# Patient Record
Sex: Female | Born: 1980
Health system: Southern US, Community
[De-identification: ages and names within clinical notes are randomized; demographics above are authoritative.]

## PROBLEM LIST (undated history)

## (undated) ENCOUNTER — Emergency Department (HOSPITAL_BASED_OUTPATIENT_CLINIC_OR_DEPARTMENT_OTHER): Payer: BC Managed Care – PPO | Source: Home / Self Care

## (undated) DIAGNOSIS — I498 Other specified cardiac arrhythmias: Secondary | ICD-10-CM

## (undated) DIAGNOSIS — D6800 Von Willebrand disease, unspecified: Secondary | ICD-10-CM

## (undated) DIAGNOSIS — F419 Anxiety disorder, unspecified: Secondary | ICD-10-CM

## (undated) DIAGNOSIS — Z803 Family history of malignant neoplasm of breast: Secondary | ICD-10-CM

## (undated) DIAGNOSIS — D649 Anemia, unspecified: Secondary | ICD-10-CM

## (undated) DIAGNOSIS — E242 Drug-induced Cushing's syndrome: Secondary | ICD-10-CM

## (undated) DIAGNOSIS — K859 Acute pancreatitis without necrosis or infection, unspecified: Secondary | ICD-10-CM

## (undated) DIAGNOSIS — Z8719 Personal history of other diseases of the digestive system: Secondary | ICD-10-CM

## (undated) DIAGNOSIS — Z800 Family history of malignant neoplasm of digestive organs: Secondary | ICD-10-CM

## (undated) DIAGNOSIS — C50919 Malignant neoplasm of unspecified site of unspecified female breast: Secondary | ICD-10-CM

## (undated) DIAGNOSIS — R112 Nausea with vomiting, unspecified: Secondary | ICD-10-CM

## (undated) DIAGNOSIS — IMO0001 Reserved for inherently not codable concepts without codable children: Secondary | ICD-10-CM

## (undated) DIAGNOSIS — K3580 Unspecified acute appendicitis: Secondary | ICD-10-CM

## (undated) DIAGNOSIS — Z87442 Personal history of urinary calculi: Secondary | ICD-10-CM

## (undated) DIAGNOSIS — J45909 Unspecified asthma, uncomplicated: Secondary | ICD-10-CM

## (undated) DIAGNOSIS — R000 Tachycardia, unspecified: Secondary | ICD-10-CM

## (undated) DIAGNOSIS — IMO0002 Reserved for concepts with insufficient information to code with codable children: Secondary | ICD-10-CM

## (undated) DIAGNOSIS — E249 Cushing's syndrome, unspecified: Secondary | ICD-10-CM

## (undated) DIAGNOSIS — K834 Spasm of sphincter of Oddi: Secondary | ICD-10-CM

## (undated) DIAGNOSIS — G90A Postural orthostatic tachycardia syndrome (POTS): Secondary | ICD-10-CM

## (undated) DIAGNOSIS — I951 Orthostatic hypotension: Secondary | ICD-10-CM

## (undated) DIAGNOSIS — F32A Depression, unspecified: Secondary | ICD-10-CM

## (undated) DIAGNOSIS — R197 Diarrhea, unspecified: Secondary | ICD-10-CM

## (undated) DIAGNOSIS — F329 Major depressive disorder, single episode, unspecified: Secondary | ICD-10-CM

## (undated) DIAGNOSIS — R002 Palpitations: Secondary | ICD-10-CM

## (undated) HISTORY — PX: REDUCTION MAMMAPLASTY: SUR839

## (undated) HISTORY — DX: Postural orthostatic tachycardia syndrome (POTS): G90.A

## (undated) HISTORY — DX: Family history of malignant neoplasm of breast: Z80.3

## (undated) HISTORY — DX: Orthostatic hypotension: I95.1

## (undated) HISTORY — DX: Unspecified acute appendicitis: K35.80

## (undated) HISTORY — DX: Other specified cardiac arrhythmias: I49.8

## (undated) HISTORY — DX: Malignant neoplasm of unspecified site of unspecified female breast: C50.919

## (undated) HISTORY — DX: Drug-induced Cushing's syndrome: E24.2

## (undated) HISTORY — DX: Family history of malignant neoplasm of digestive organs: Z80.0

## (undated) HISTORY — DX: Nausea with vomiting, unspecified: R11.2

## (undated) HISTORY — DX: Tachycardia, unspecified: R00.0

## (undated) HISTORY — DX: Palpitations: R00.2

## (undated) HISTORY — DX: Anemia, unspecified: D64.9

## (undated) HISTORY — PX: BACK SURGERY: SHX140

## (undated) HISTORY — DX: Acute pancreatitis without necrosis or infection, unspecified: K85.90

## (undated) HISTORY — PX: LUMBAR LAMINECTOMY: SHX95

## (undated) HISTORY — PX: CHOLECYSTECTOMY: SHX55

## (undated) HISTORY — DX: Diarrhea, unspecified: R19.7

## (undated) HISTORY — PX: APPENDECTOMY: SHX54

## (undated) HISTORY — PX: LAPAROSCOPIC ENDOMETRIOSIS FULGURATION: SUR769

---
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
## 2002-02-19 ENCOUNTER — Other Ambulatory Visit: Admission: RE | Admit: 2002-02-19 | Discharge: 2002-02-19 | Payer: Self-pay | Admitting: Obstetrics and Gynecology

## 2002-10-20 ENCOUNTER — Ambulatory Visit (HOSPITAL_COMMUNITY): Admission: RE | Admit: 2002-10-20 | Discharge: 2002-10-20 | Payer: Self-pay | Admitting: Internal Medicine

## 2002-10-20 ENCOUNTER — Encounter: Payer: Self-pay | Admitting: Internal Medicine

## 2003-03-04 ENCOUNTER — Other Ambulatory Visit: Admission: RE | Admit: 2003-03-04 | Discharge: 2003-03-04 | Payer: Self-pay | Admitting: Obstetrics and Gynecology

## 2003-07-15 ENCOUNTER — Ambulatory Visit (HOSPITAL_COMMUNITY): Admission: RE | Admit: 2003-07-15 | Discharge: 2003-07-16 | Payer: Self-pay | Admitting: Internal Medicine

## 2003-07-16 ENCOUNTER — Encounter: Payer: Self-pay | Admitting: Internal Medicine

## 2004-02-21 ENCOUNTER — Emergency Department (HOSPITAL_COMMUNITY): Admission: EM | Admit: 2004-02-21 | Discharge: 2004-02-21 | Payer: Self-pay

## 2004-02-24 ENCOUNTER — Ambulatory Visit (HOSPITAL_COMMUNITY): Admission: RE | Admit: 2004-02-24 | Discharge: 2004-02-24 | Payer: Self-pay | Admitting: General Surgery

## 2004-03-29 ENCOUNTER — Other Ambulatory Visit: Admission: RE | Admit: 2004-03-29 | Discharge: 2004-03-29 | Payer: Self-pay | Admitting: Obstetrics and Gynecology

## 2004-10-02 ENCOUNTER — Ambulatory Visit (HOSPITAL_COMMUNITY): Admission: RE | Admit: 2004-10-02 | Discharge: 2004-10-02 | Payer: Self-pay | Admitting: Surgery

## 2004-10-24 ENCOUNTER — Emergency Department (HOSPITAL_COMMUNITY): Admission: EM | Admit: 2004-10-24 | Discharge: 2004-10-24 | Payer: Self-pay | Admitting: Emergency Medicine

## 2004-10-25 ENCOUNTER — Emergency Department (HOSPITAL_COMMUNITY): Admission: EM | Admit: 2004-10-25 | Discharge: 2004-10-26 | Payer: Self-pay | Admitting: Emergency Medicine

## 2004-11-20 ENCOUNTER — Ambulatory Visit: Payer: Self-pay | Admitting: Oncology

## 2005-02-16 ENCOUNTER — Ambulatory Visit: Payer: Self-pay | Admitting: Oncology

## 2005-08-14 ENCOUNTER — Encounter: Admission: RE | Admit: 2005-08-14 | Discharge: 2005-08-14 | Payer: Self-pay | Admitting: Occupational Medicine

## 2005-08-21 ENCOUNTER — Encounter: Admission: RE | Admit: 2005-08-21 | Discharge: 2005-08-21 | Payer: Self-pay | Admitting: Occupational Medicine

## 2005-10-06 ENCOUNTER — Emergency Department (HOSPITAL_COMMUNITY): Admission: EM | Admit: 2005-10-06 | Discharge: 2005-10-06 | Payer: Self-pay | Admitting: Emergency Medicine

## 2005-12-25 ENCOUNTER — Ambulatory Visit: Payer: Self-pay | Admitting: Internal Medicine

## 2006-01-01 ENCOUNTER — Ambulatory Visit: Payer: Self-pay | Admitting: Oncology

## 2006-01-02 ENCOUNTER — Ambulatory Visit: Payer: Self-pay | Admitting: Oncology

## 2006-02-15 ENCOUNTER — Encounter: Admission: RE | Admit: 2006-02-15 | Discharge: 2006-02-15 | Payer: Self-pay | Admitting: *Deleted

## 2006-03-22 ENCOUNTER — Ambulatory Visit: Payer: Self-pay | Admitting: Oncology

## 2006-03-26 LAB — IRON AND TIBC
%SAT: 38 % (ref 20–55)
Iron: 130 ug/dL (ref 42–145)
TIBC: 343 ug/dL (ref 250–470)

## 2006-03-26 LAB — CBC WITH DIFFERENTIAL (CANCER CENTER ONLY)
BASO%: 0.6 % (ref 0.0–2.0)
Eosinophils Absolute: 0.1 10*3/uL (ref 0.0–0.5)
HCT: 39.4 % (ref 34.8–46.6)
LYMPH#: 1.8 10*3/uL (ref 0.9–3.3)
LYMPH%: 30.3 % (ref 14.0–48.0)
MCV: 91 fL (ref 81–101)
MONO#: 0.5 10*3/uL (ref 0.1–0.9)
NEUT%: 59.8 % (ref 39.6–80.0)
Platelets: 256 10*3/uL (ref 145–400)
RDW: 11.6 % (ref 10.5–14.6)

## 2006-03-26 LAB — BASIC METABOLIC PANEL - CANCER CENTER ONLY
CO2: 21 mEq/L (ref 18–33)
Calcium: 9.1 mg/dL (ref 8.0–10.3)
Potassium: 3.7 mEq/L (ref 3.3–4.7)
Sodium: 138 mEq/L (ref 128–145)

## 2006-03-26 LAB — FERRITIN: Ferritin: 130 ng/mL (ref 10–291)

## 2006-06-24 ENCOUNTER — Ambulatory Visit: Payer: Self-pay | Admitting: Oncology

## 2006-06-25 LAB — CBC WITH DIFFERENTIAL (CANCER CENTER ONLY)
BASO#: 0 10*3/uL (ref 0.0–0.2)
EOS%: 2 % (ref 0.0–7.0)
HCT: 41.1 % (ref 34.8–46.6)
HGB: 13.6 g/dL (ref 11.6–15.9)
LYMPH#: 1.9 10*3/uL (ref 0.9–3.3)
LYMPH%: 33.6 % (ref 14.0–48.0)
MCH: 30.9 pg (ref 26.0–34.0)
MCHC: 33.2 g/dL (ref 32.0–36.0)
MCV: 93 fL (ref 81–101)
MONO%: 8 % (ref 0.0–13.0)
NEUT%: 56 % (ref 39.6–80.0)

## 2006-06-26 LAB — FERRITIN: Ferritin: 160 ng/mL (ref 10–291)

## 2006-06-26 LAB — IRON AND TIBC: Iron: 105 ug/dL (ref 42–145)

## 2006-09-16 ENCOUNTER — Ambulatory Visit: Payer: Self-pay | Admitting: Oncology

## 2007-11-25 ENCOUNTER — Ambulatory Visit: Payer: Self-pay | Admitting: Oncology

## 2007-11-27 LAB — CBC WITH DIFFERENTIAL (CANCER CENTER ONLY)
BASO#: 0.1 10*3/uL (ref 0.0–0.2)
HCT: 39.9 % (ref 34.8–46.6)
HGB: 13.5 g/dL (ref 11.6–15.9)
LYMPH#: 2.1 10*3/uL (ref 0.9–3.3)
LYMPH%: 30.9 % (ref 14.0–48.0)
MCV: 89 fL (ref 81–101)
MONO#: 0.4 10*3/uL (ref 0.1–0.9)
NEUT%: 60.2 % (ref 39.6–80.0)
RDW: 11.2 % (ref 10.5–14.6)
WBC: 6.8 10*3/uL (ref 3.9–10.0)

## 2007-11-27 LAB — FERRITIN: Ferritin: 149 ng/mL (ref 10–291)

## 2007-11-27 LAB — IRON AND TIBC: Iron: 149 ug/dL — ABNORMAL HIGH (ref 42–145)

## 2009-04-26 ENCOUNTER — Ambulatory Visit: Payer: Self-pay | Admitting: Oncology

## 2009-05-04 LAB — CBC WITH DIFFERENTIAL (CANCER CENTER ONLY)
BASO#: 0.1 10*3/uL (ref 0.0–0.2)
HCT: 41 % (ref 34.8–46.6)
HGB: 14.1 g/dL (ref 11.6–15.9)
LYMPH#: 1.6 10*3/uL (ref 0.9–3.3)
MCV: 89 fL (ref 81–101)
MONO#: 0.4 10*3/uL (ref 0.1–0.9)
NEUT%: 62 % (ref 39.6–80.0)
WBC: 5.9 10*3/uL (ref 3.9–10.0)

## 2009-05-04 LAB — APTT: aPTT: 29 seconds (ref 24–37)

## 2009-05-04 LAB — PROTIME-INR (CHCC SATELLITE)
INR: 1 — ABNORMAL LOW (ref 2.0–3.5)
Protime: 12 Seconds (ref 10.6–13.4)

## 2009-05-04 LAB — CMP (CANCER CENTER ONLY)
ALT(SGPT): 19 U/L (ref 10–47)
Albumin: 3.9 g/dL (ref 3.3–5.5)
CO2: 26 mEq/L (ref 18–33)
Calcium: 9.2 mg/dL (ref 8.0–10.3)
Chloride: 98 mEq/L (ref 98–108)
Creat: 0.7 mg/dl (ref 0.6–1.2)
Potassium: 3.8 mEq/L (ref 3.3–4.7)
Sodium: 143 mEq/L (ref 128–145)
Total Protein: 7.7 g/dL (ref 6.4–8.1)

## 2009-05-04 LAB — FERRITIN: Ferritin: 19 ng/mL (ref 10–291)

## 2009-05-26 ENCOUNTER — Ambulatory Visit: Payer: Self-pay | Admitting: Oncology

## 2009-06-22 ENCOUNTER — Ambulatory Visit: Payer: Self-pay | Admitting: Oncology

## 2009-06-25 ENCOUNTER — Emergency Department (HOSPITAL_COMMUNITY): Admission: EM | Admit: 2009-06-25 | Discharge: 2009-06-25 | Payer: Self-pay | Admitting: Emergency Medicine

## 2010-04-17 ENCOUNTER — Ambulatory Visit: Payer: Self-pay | Admitting: Oncology

## 2010-04-21 ENCOUNTER — Ambulatory Visit: Payer: Self-pay | Admitting: Oncology

## 2010-04-21 LAB — CBC WITH DIFFERENTIAL/PLATELET
BASO%: 0.2 % (ref 0.0–2.0)
Basophils Absolute: 0 10*3/uL (ref 0.0–0.1)
EOS%: 0.1 % (ref 0.0–7.0)
Eosinophils Absolute: 0 10*3/uL (ref 0.0–0.5)
HCT: 36.7 % (ref 34.8–46.6)
HGB: 12.7 g/dL (ref 11.6–15.9)
LYMPH%: 10.6 % — ABNORMAL LOW (ref 14.0–49.7)
MCH: 32.2 pg (ref 25.1–34.0)
MCHC: 34.5 g/dL (ref 31.5–36.0)
MCV: 93.3 fL (ref 79.5–101.0)
MONO#: 0.5 10*3/uL (ref 0.1–0.9)
MONO%: 5.3 % (ref 0.0–14.0)
NEUT#: 8.3 10*3/uL — ABNORMAL HIGH (ref 1.5–6.5)
NEUT%: 83.8 % — ABNORMAL HIGH (ref 38.4–76.8)
Platelets: 216 10*3/uL (ref 145–400)
RBC: 3.94 10*6/uL (ref 3.70–5.45)
RDW: 13.4 % (ref 11.2–14.5)
WBC: 9.9 10*3/uL (ref 3.9–10.3)
lymph#: 1 10*3/uL (ref 0.9–3.3)

## 2010-04-21 LAB — COMPREHENSIVE METABOLIC PANEL
ALT: 27 U/L (ref 0–35)
AST: 21 U/L (ref 0–37)
Albumin: 4 g/dL (ref 3.5–5.2)
Alkaline Phosphatase: 48 U/L (ref 39–117)
BUN: 13 mg/dL (ref 6–23)
CO2: 26 mEq/L (ref 19–32)
Calcium: 8.6 mg/dL (ref 8.4–10.5)
Chloride: 108 mEq/L (ref 96–112)
Creatinine, Ser: 0.82 mg/dL (ref 0.40–1.20)
Glucose, Bld: 89 mg/dL (ref 70–99)
Potassium: 3.6 mEq/L (ref 3.5–5.3)
Sodium: 138 mEq/L (ref 135–145)
Total Bilirubin: 0.6 mg/dL (ref 0.3–1.2)
Total Protein: 7.1 g/dL (ref 6.0–8.3)

## 2010-04-21 LAB — VITAMIN B12: Vitamin B-12: 236 pg/mL (ref 211–911)

## 2010-04-21 LAB — FOLATE: Folate: 5.4 ng/mL

## 2010-04-21 LAB — IRON AND TIBC
%SAT: 51 % (ref 20–55)
Iron: 139 ug/dL (ref 42–145)
TIBC: 273 ug/dL (ref 250–470)
UIBC: 134 ug/dL

## 2010-04-21 LAB — FERRITIN: Ferritin: 227 ng/mL (ref 10–291)

## 2010-08-29 HISTORY — PX: ABDOMINAL HYSTERECTOMY: SHX81

## 2010-09-18 ENCOUNTER — Encounter (INDEPENDENT_AMBULATORY_CARE_PROVIDER_SITE_OTHER): Payer: Self-pay | Admitting: Obstetrics and Gynecology

## 2010-09-18 ENCOUNTER — Ambulatory Visit (HOSPITAL_COMMUNITY): Admission: RE | Admit: 2010-09-18 | Discharge: 2010-09-19 | Payer: Self-pay | Admitting: Obstetrics and Gynecology

## 2011-01-09 LAB — CBC
HCT: 34.4 % — ABNORMAL LOW (ref 36.0–46.0)
HCT: 38.6 % (ref 36.0–46.0)
Hemoglobin: 11.6 g/dL — ABNORMAL LOW (ref 12.0–15.0)
Hemoglobin: 13.3 g/dL (ref 12.0–15.0)
MCH: 32.3 pg (ref 26.0–34.0)
MCH: 32.4 pg (ref 26.0–34.0)
MCHC: 33.9 g/dL (ref 30.0–36.0)
MCHC: 34.5 g/dL (ref 30.0–36.0)
MCV: 94.1 fL (ref 78.0–100.0)
MCV: 95.2 fL (ref 78.0–100.0)
Platelets: 207 10*3/uL (ref 150–400)
Platelets: 211 10*3/uL (ref 150–400)
RBC: 3.61 MIL/uL — ABNORMAL LOW (ref 3.87–5.11)
RBC: 4.11 MIL/uL (ref 3.87–5.11)
RDW: 12.4 % (ref 11.5–15.5)
RDW: 12.8 % (ref 11.5–15.5)
WBC: 11.6 10*3/uL — ABNORMAL HIGH (ref 4.0–10.5)
WBC: 7.3 10*3/uL (ref 4.0–10.5)

## 2011-01-09 LAB — SURGICAL PCR SCREEN
MRSA, PCR: NEGATIVE
Staphylococcus aureus: NEGATIVE

## 2011-01-09 LAB — HCG, SERUM, QUALITATIVE: Preg, Serum: NEGATIVE

## 2011-01-09 LAB — IRON: Iron: 102 ug/dL (ref 42–135)

## 2011-03-02 ENCOUNTER — Emergency Department (HOSPITAL_BASED_OUTPATIENT_CLINIC_OR_DEPARTMENT_OTHER)
Admission: EM | Admit: 2011-03-02 | Discharge: 2011-03-03 | Disposition: A | Discharge status: Home / Self Care | Payer: PRIVATE HEALTH INSURANCE | Attending: Emergency Medicine | Admitting: Emergency Medicine

## 2011-03-02 DIAGNOSIS — R21 Rash and other nonspecific skin eruption: Secondary | ICD-10-CM | POA: Insufficient documentation

## 2011-03-02 DIAGNOSIS — T782XXA Anaphylactic shock, unspecified, initial encounter: Secondary | ICD-10-CM | POA: Insufficient documentation

## 2011-03-02 DIAGNOSIS — I10 Essential (primary) hypertension: Secondary | ICD-10-CM | POA: Insufficient documentation

## 2011-03-02 DIAGNOSIS — Z79899 Other long term (current) drug therapy: Secondary | ICD-10-CM | POA: Insufficient documentation

## 2011-03-16 NOTE — Procedures (Signed)
. [facility]  Patient:    Kaitlyn Johnson, Kaitlyn Johnson                    MRN: [id number] Adm. Date:  [id number] Attending:  Vincent Peyer                           Procedure Report  PROCEDURE:  Colonoscopy.  INDICATIONS:  This 30 year old white female has had chronic diarrhea which occurs not only post prandially but also, at times, at night.  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 Margot Chimes.  She subsequently had right upper quadrant/lower abdominal pain and underwent cholecystectomy with resulting change in the bowel habits.  She has failed the standard treatment for irritable bowel syndrome using antispasmodics and antimotility agents.  She is undergoing colonoscopy because of a family history of Crohns disease in a distant relative.  Her small bowel follow through was normal.  ENDOSCOPE:  Fujinon single-channel videoscope.  SEDATION:  Versed 10 mg IV, Fentanyl 75 mcg IV.  FINDINGS:  Fujinon single-channel video endoscope passed under direct vision through the rectum to sigmoid colon.  Patient was monitored by pulse oximetry. Oxygen saturation was normal.  Her prep was excellent.  Anal canal and rectal ampulla were normal.  Colonoscope passed easily through the normal-appearing sigmoid colon.  Submucosal vascular pattern was normal.  There were no aphthous ulcers, no deformity, no diverticulosis.  Colonoscope passed easily through the descending colon through the splenic flexure, transverse colon, hepatic flexure, right colon, and to the cecum.  Cecal pouch appeared clean with normal-appearing appendiceal opening;  also ileocecal valve appeared normal.  Multiple biopsies were taken from the ileocecal valve.  I was unable to enter into the terminal ileum.  As the endoscope was retracted random biopsies were taken from the right, transverse,  and left colon to rule out microscopic colitis.  Patient tolerated the procedure well.  IMPRESSION:  Normal colonoscopy to the cecum status post biopsy of the ileocecal valve and random biopsies of the colon.  PLAN:  Patient is to continue on the irritable bowel regimen for this time using Levbid 0.37 mg p.o. b.i.d. and Colestid tablets one b.i.d.  She is also on p.r.n. Lomotil and low fat diet.  If the biopsies are normal then she will continue on this regimen.DD:  06/10/01 TD:  06/10/01 Job: [id number]

## 2011-03-16 NOTE — Op Note (Signed)
[place]  Patient:    Kaitlyn Johnson, Kaitlyn Johnson                    MRN: [id number] Adm. Date:  [id number] Attending:  Oletha Cruel CC:         Lowella Bandy. Olevia Perches, M.D. [facility]   Operative Report  CCS# [id number]  PREOPERATIVE DIAGNOSIS:  Chronic cholecystitis with probable cholelithiasis or gallbladder polyps.  POSTOPERATIVE DIAGNOSIS:  Chronic cholecystitis with probable cholelithiasis or gallbladder polyps.  OPERATION PERFORMED:  Laparoscopic cholecystectomy.  SURGEON:  Haywood Lasso, M.D.  ASSISTANT:  Lew Dawes. Rosana Hoes, M.D.  ANESTHESIA:  General endotracheal.  INDICATIONS FOR PROCEDURE:  The patient is a 30 year old girl who about a year and a half ago underwent a duodenal bypass for a superior mesenteric artery syndrome and significantly improved her GI symptoms.  She had recently developed some more GI symptoms, these different from the original with more postprandial pain and evaluation had shown what appeared to be some small nonshadowing stones in the gallbladder.  It was felt that her symptoms would most likely biliary in origin and after discussion with her and her family, we elected to proceed to laparoscopic cholecystectomy.  She had also been previously evaluated by Dr. Olevia Perches who had actually recommended cholecystectomy.  DESCRIPTION OF PROCEDURE:  The patient was brought to the operating room and after satisfactory general endotracheal had been obtained, the abdomen was prepped and draped.  I injected some 0.25% plain Marcaine at the umbilicus and made an umbilical incision.  The fascia was identified, picked up and opened. The peritoneal cavity was gingerly entered because of her prior surgery but there was free entry into the peritoneal cavity.  I was able to put my finger in and feel that there were no adhesions going up into the right upper quadrant.  A pursestring suture was placed and the Hasson introduced and the  abdomen insufflated to 15.  The camera was placed and I could see a couple of adhesions of omentum to the midline incision but basically and a couple in the right lateral abdomen that were flimsy but we had good access into the upper abdomen.  I injected some more Marcaine in the epigastrium and made a transverse incision and placed a #10-11 dilating trocar through under direct vision.  Using that I took a few of the lateral adhesions down to free up a little more space and we put two 5 mm trocars in.  There were some adhesions of the duodenum and what appeared to the the Roux-en-Y loop up to the gallbladder.  These were taken down sharply with scissors and gallbladder freed up.  I was able to then begin dissection in the triangle of Calot and identified the cystic artery, the common duct and the cystic duct and the cystic duct was a little bit kinked but with a little dissection, we were able to straighten this out.  There was a small what appeared to be a lymphatic on the surface which I clipped and then divided giving up a little bit better mobility and then once the anatomy was cleared up and I could see well into the triangle to make sure there were no other structures back there, I went ahead and clipped and divided the cystic followed by clipping and dividing the cystic artery.  There appeared to be a small branch of the cystic artery that I found as we were bringing the gallbladder up and out and  that was also clipped.  The gallbladder was removed from below to above using coagulation current of the cautery.  It was somewaht intrahepatic but came out nicely with no spillage of bile and no significant bleeding.  Prior to disconnecting we irrigated to make sure everything was dry and again, everything appeared to be fine.  The gallbladder was disconnected and brought out the umbilical port.  The umbilical site was occluded for a moment while the abdomen was reinflated and then we  checked for hemostasis and then removed the lateral ports.  They were dry and the umbilical port was closed with a pursestring suture.  The abdomen was deflated through the epigastric port.  The skin was closed with 4-0 Monocryl and subcuticular and Steri-Strips.  The patient tolerated the procedure well.  There were no operative complications.  All counts were correct.DD:  08/29/00 TD:  08/30/00 Job: [id number]

## 2011-05-21 ENCOUNTER — Other Ambulatory Visit: Payer: Self-pay | Admitting: Oncology

## 2011-05-21 ENCOUNTER — Encounter (HOSPITAL_BASED_OUTPATIENT_CLINIC_OR_DEPARTMENT_OTHER): Payer: PRIVATE HEALTH INSURANCE | Admitting: Oncology

## 2011-05-21 DIAGNOSIS — D508 Other iron deficiency anemias: Secondary | ICD-10-CM

## 2011-05-21 LAB — CBC & DIFF AND RETIC
BASO%: 0.3 % (ref 0.0–2.0)
Basophils Absolute: 0 10*3/uL (ref 0.0–0.1)
EOS%: 0.8 % (ref 0.0–7.0)
Eosinophils Absolute: 0.1 10*3/uL (ref 0.0–0.5)
HCT: 36.3 % (ref 34.8–46.6)
HGB: 11.8 g/dL (ref 11.6–15.9)
Immature Retic Fract: 6 % (ref 0.00–10.70)
LYMPH%: 25 % (ref 14.0–49.7)
MCHC: 32.5 g/dL (ref 31.5–36.0)
MCV: 97.6 fL (ref 79.5–101.0)
MONO%: 6.9 % (ref 0.0–14.0)
NEUT#: 4.4 10*3/uL (ref 1.5–6.5)
NEUT%: 67 % (ref 38.4–76.8)
Platelets: 239 10*3/uL (ref 145–400)
RBC: 3.72 10*6/uL (ref 3.70–5.45)
RDW: 13.3 % (ref 11.2–14.5)
Retic Ct Abs: 63.61 10*3/uL (ref 18.30–72.70)
lymph#: 1.6 10*3/uL (ref 0.9–3.3)

## 2011-05-21 LAB — IRON AND TIBC
TIBC: 222 ug/dL — ABNORMAL LOW (ref 250–470)
UIBC: 147 ug/dL

## 2011-05-21 LAB — COMPREHENSIVE METABOLIC PANEL
AST: 20 U/L (ref 0–37)
BUN: 19 mg/dL (ref 6–23)
Calcium: 9 mg/dL (ref 8.4–10.5)
Chloride: 100 mEq/L (ref 96–112)
Creatinine, Ser: 0.74 mg/dL (ref 0.50–1.10)

## 2011-07-06 ENCOUNTER — Encounter (HOSPITAL_BASED_OUTPATIENT_CLINIC_OR_DEPARTMENT_OTHER): Payer: PRIVATE HEALTH INSURANCE | Admitting: Oncology

## 2011-07-06 ENCOUNTER — Other Ambulatory Visit: Payer: Self-pay | Admitting: Oncology

## 2011-07-06 DIAGNOSIS — D509 Iron deficiency anemia, unspecified: Secondary | ICD-10-CM

## 2011-07-06 DIAGNOSIS — E538 Deficiency of other specified B group vitamins: Secondary | ICD-10-CM

## 2011-07-06 LAB — CBC WITH DIFFERENTIAL/PLATELET
Eosinophils Absolute: 0 10*3/uL (ref 0.0–0.5)
HGB: 13.4 g/dL (ref 11.6–15.9)
MONO#: 0.5 10*3/uL (ref 0.1–0.9)
NEUT#: 4.4 10*3/uL (ref 1.5–6.5)
Platelets: 262 10*3/uL (ref 145–400)
RBC: 3.91 10*6/uL (ref 3.70–5.45)
RDW: 13.2 % (ref 11.2–14.5)
WBC: 6.2 10*3/uL (ref 3.9–10.3)

## 2011-07-06 LAB — FERRITIN: Ferritin: 158 ng/mL (ref 10–291)

## 2011-07-06 LAB — IRON AND TIBC
%SAT: 28 % (ref 20–55)
Iron: 88 ug/dL (ref 42–145)
TIBC: 311 ug/dL (ref 250–470)

## 2011-07-09 ENCOUNTER — Other Ambulatory Visit: Payer: Self-pay | Admitting: Oncology

## 2011-07-09 ENCOUNTER — Encounter (HOSPITAL_BASED_OUTPATIENT_CLINIC_OR_DEPARTMENT_OTHER): Payer: PRIVATE HEALTH INSURANCE | Admitting: Oncology

## 2011-07-09 DIAGNOSIS — D509 Iron deficiency anemia, unspecified: Secondary | ICD-10-CM

## 2011-07-09 DIAGNOSIS — E538 Deficiency of other specified B group vitamins: Secondary | ICD-10-CM

## 2011-07-16 LAB — VON WILLEBRAND FACTOR MULTIMER
Factor-VIII Activity: 124 % (ref 50–180)
Ristocetin Co-Factor: 91 % (ref 42–200)
Von Willebrand Factor Ag: 86 % (ref 50–217)

## 2011-07-16 LAB — FACTOR 2 ASSAY: Factor II Activity: 120 % (ref 74–131)

## 2011-08-10 ENCOUNTER — Other Ambulatory Visit: Payer: Self-pay | Admitting: Oncology

## 2011-08-10 ENCOUNTER — Encounter (HOSPITAL_BASED_OUTPATIENT_CLINIC_OR_DEPARTMENT_OTHER): Payer: PRIVATE HEALTH INSURANCE | Admitting: Oncology

## 2011-08-10 DIAGNOSIS — D509 Iron deficiency anemia, unspecified: Secondary | ICD-10-CM

## 2011-08-10 DIAGNOSIS — E538 Deficiency of other specified B group vitamins: Secondary | ICD-10-CM

## 2011-08-10 DIAGNOSIS — D689 Coagulation defect, unspecified: Secondary | ICD-10-CM

## 2011-08-10 LAB — IVY BLEEDING TIME

## 2011-08-15 LAB — FACTOR 11 ASSAY: Factor XI Activity: 99 % (ref 65–150)

## 2011-08-15 LAB — FACTOR 13 ACTIVITY

## 2011-08-21 LAB — PLATELET AGGREGATION STUDY, BLOOD
ADP10: NORMAL
ADP5: NORMAL

## 2011-10-03 ENCOUNTER — Telehealth: Payer: Self-pay | Admitting: *Deleted

## 2011-10-03 NOTE — Telephone Encounter (Signed)
left voicemessage to inform the patient of the new date and time of the 11-26-2011 starting at 1:00pm

## 2011-11-26 ENCOUNTER — Encounter: Payer: Self-pay | Admitting: Oncology

## 2011-11-26 ENCOUNTER — Telehealth: Payer: Self-pay | Admitting: Oncology

## 2011-11-26 ENCOUNTER — Other Ambulatory Visit (HOSPITAL_BASED_OUTPATIENT_CLINIC_OR_DEPARTMENT_OTHER): Payer: Commercial Managed Care - PPO | Admitting: Lab

## 2011-11-26 ENCOUNTER — Ambulatory Visit (HOSPITAL_BASED_OUTPATIENT_CLINIC_OR_DEPARTMENT_OTHER): Payer: Commercial Managed Care - PPO | Admitting: Oncology

## 2011-11-26 DIAGNOSIS — I498 Other specified cardiac arrhythmias: Secondary | ICD-10-CM | POA: Diagnosis present

## 2011-11-26 DIAGNOSIS — E538 Deficiency of other specified B group vitamins: Secondary | ICD-10-CM

## 2011-11-26 DIAGNOSIS — G90A Postural orthostatic tachycardia syndrome (POTS): Secondary | ICD-10-CM | POA: Diagnosis present

## 2011-11-26 DIAGNOSIS — R000 Tachycardia, unspecified: Secondary | ICD-10-CM

## 2011-11-26 DIAGNOSIS — D689 Coagulation defect, unspecified: Secondary | ICD-10-CM

## 2011-11-26 DIAGNOSIS — R238 Other skin changes: Secondary | ICD-10-CM | POA: Insufficient documentation

## 2011-11-26 DIAGNOSIS — D649 Anemia, unspecified: Secondary | ICD-10-CM | POA: Insufficient documentation

## 2011-11-26 DIAGNOSIS — D509 Iron deficiency anemia, unspecified: Secondary | ICD-10-CM

## 2011-11-26 DIAGNOSIS — I951 Orthostatic hypotension: Secondary | ICD-10-CM

## 2011-11-26 HISTORY — DX: Anemia, unspecified: D64.9

## 2011-11-26 LAB — CBC WITH DIFFERENTIAL/PLATELET
Basophils Absolute: 0 10*3/uL (ref 0.0–0.1)
Eosinophils Absolute: 0.1 10*3/uL (ref 0.0–0.5)
HCT: 39 % (ref 34.8–46.6)
HGB: 13.2 g/dL (ref 11.6–15.9)
LYMPH%: 25.9 % (ref 14.0–49.7)
MCV: 97.7 fL (ref 79.5–101.0)
MONO%: 8.8 % (ref 0.0–14.0)
NEUT#: 3.9 10*3/uL (ref 1.5–6.5)
Platelets: 260 10*3/uL (ref 145–400)
RDW: 13.1 % (ref 11.2–14.5)

## 2011-11-26 NOTE — Progress Notes (Signed)
OFFICE PROGRESS NOTE  CC Dr. Sharrell Ku  DIAGNOSIS: 31 year old female with iron deficiency anemia secondary to malabsorption and easy bruising.  PRIOR THERAPY:  #1 patient has had parenteral iron for iron deficiency anemia. Currently her counts remain stable.  #2 easy bruising: Patient hasn't had a full workup without anything being revealing.  CURRENT THERAPY:Observation  INTERVAL HISTORY: Kaitlyn Johnson 31 y.o. female returns for Followup visit today. Overall she is doing well she is without any significant complaints except for ongoing bruising. She has noticed some bruising only on the legs. There is no evidence of bruising on the abdomen chest or back or upper arms. She has not had any bleeding problems. She denies any fevers chills night sweats headaches she has no shortness of breath no chest pains or palpitations. She has no hematochezia or melena. Remainder of the 10 point review of systems is negative.  MEDICAL HISTORY: Past Medical History  Diagnosis Date  . Anemia   . Clotting disorder     bruising easy  . Anemia 11/26/2011  . Easy bruising 11/26/2011  . POTS (postural orthostatic tachycardia syndrome) 11/26/2011    ALLERGIES:   has no known allergies.  MEDICATIONS:  Current Outpatient Prescriptions  Medication Sig Dispense Refill  . budesonide-formoterol (SYMBICORT) 80-4.5 MCG/ACT inhaler Inhale 2 puffs into the lungs 2 (two) times daily.      Marland Kitchen diltiazem (CARDIZEM) 90 MG tablet Take 185 mg by mouth daily.      . DULoxetine (CYMBALTA) 60 MG capsule Take 60 mg by mouth daily.      Marland Kitchen lamoTRIgine (LAMICTAL) 100 MG tablet Take 100 mg by mouth daily.      Marland Kitchen levalbuterol (XOPENEX HFA) 45 MCG/ACT inhaler Inhale 1-2 puffs into the lungs every 4 (four) hours as needed.      . metoprolol (LOPRESSOR) 50 MG tablet Take 75 mg by mouth 2 (two) times daily.      . montelukast (SINGULAIR) 10 MG tablet Take 10 mg by mouth at bedtime.      . vitamin B-12 (CYANOCOBALAMIN)  1000 MCG tablet Take 1,000 mcg by mouth daily.      Marland Kitchen zolpidem (AMBIEN) 10 MG tablet Take 12 mg by mouth at bedtime as needed.        SURGICAL HISTORY: History reviewed. No pertinent past surgical history.  REVIEW OF SYSTEMS:  Pertinent items are noted in HPI.   PHYSICAL EXAMINATION: General appearance: alert, cooperative and appears stated age Head: Normocephalic, without obvious abnormality, atraumatic Neck: no adenopathy, no carotid bruit, no JVD, supple, symmetrical, trachea midline and thyroid not enlarged, symmetric, no tenderness/mass/nodules Lymph nodes: Cervical, supraclavicular, and axillary nodes normal. Resp: clear to auscultation bilaterally and normal percussion bilaterally Back: symmetric, no curvature. ROM normal. No CVA tenderness. Cardio: regular rate and rhythm, S1, S2 normal, no murmur, click, rub or gallop GI: soft, non-tender; bowel sounds normal; no masses,  no organomegaly Extremities: extremities normal, atraumatic, no cyanosis or edema and she is noted to have some bruising on the anterior shins. She also has one area of bruising in the upper thigh laterally. Neurologic: Alert and oriented X 3, normal strength and tone. Normal symmetric reflexes. Normal coordination and gait  ECOG PERFORMANCE STATUS: 0 - Asymptomatic  Blood pressure 104/68, pulse 61, temperature 98 F (36.7 C), temperature source Oral, weight 166 lb (75.297 kg).  LABORATORY DATA: Lab Results  Component Value Date   WBC 6.2 11/26/2011   HGB 13.2 11/26/2011   HCT 39.0 11/26/2011   MCV  97.7 11/26/2011   PLT 260 11/26/2011       RADIOGRAPHIC STUDIES:  No results found.  ASSESSMENT: 31 year old female with  #1 iron deficiency anemia secondary to malabsorption.  #2 easy bruising unclear etiology workup unrevealing.  #3 pots being worked up [employer]. Lynnea Ferrier at [facility] cardiology.   PLAN:   #1 patient's CBC looks terrific at this time there is no need for iron infusions. We will  continue to monitor her.  #2 easy bruising continue monitoring.   All questions were answered. The patient knows to call the clinic with any problems, questions or concerns. We can certainly see the patient much sooner if necessary.  I spent 20 minutes counseling the patient face to face. The total time spent in the appointment was 30 minutes.    Drue Second, MD Medical/Oncology [facility] [phone] (beeper) [phone] (Office)  11/26/2011, 5:02 PM

## 2011-11-26 NOTE — Telephone Encounter (Signed)
gve the pt her July 2013 appt calendar

## 2012-04-17 ENCOUNTER — Other Ambulatory Visit: Payer: Self-pay | Admitting: Oncology

## 2012-04-17 DIAGNOSIS — D649 Anemia, unspecified: Secondary | ICD-10-CM

## 2012-04-18 ENCOUNTER — Other Ambulatory Visit (HOSPITAL_BASED_OUTPATIENT_CLINIC_OR_DEPARTMENT_OTHER): Payer: Commercial Managed Care - PPO | Admitting: Lab

## 2012-04-18 DIAGNOSIS — D509 Iron deficiency anemia, unspecified: Secondary | ICD-10-CM

## 2012-04-18 DIAGNOSIS — D649 Anemia, unspecified: Secondary | ICD-10-CM

## 2012-04-18 LAB — CBC WITH DIFFERENTIAL/PLATELET
BASO%: 0.3 % (ref 0.0–2.0)
EOS%: 1.3 % (ref 0.0–7.0)
HCT: 37 % (ref 34.8–46.6)
MCH: 33.8 pg (ref 25.1–34.0)
MCHC: 34.3 g/dL (ref 31.5–36.0)
MONO#: 0.6 10*3/uL (ref 0.1–0.9)
NEUT%: 63.2 % (ref 38.4–76.8)
RDW: 13 % (ref 11.2–14.5)
WBC: 7.6 10*3/uL (ref 3.9–10.3)
lymph#: 2 10*3/uL (ref 0.9–3.3)

## 2012-04-21 LAB — COMPREHENSIVE METABOLIC PANEL
AST: 13 U/L (ref 0–37)
Albumin: 4.3 g/dL (ref 3.5–5.2)
Alkaline Phosphatase: 66 U/L (ref 39–117)
BUN: 16 mg/dL (ref 6–23)
Creatinine, Ser: 0.77 mg/dL (ref 0.50–1.10)
Glucose, Bld: 94 mg/dL (ref 70–99)
Total Bilirubin: 0.2 mg/dL — ABNORMAL LOW (ref 0.3–1.2)

## 2012-04-21 LAB — FOLATE: Folate: >20 ng/mL

## 2012-04-21 LAB — IRON AND TIBC
Iron: 82 ug/dL (ref 42–145)
TIBC: 308 ug/dL (ref 250–470)
UIBC: 226 ug/dL (ref 125–400)

## 2012-04-21 LAB — FERRITIN: Ferritin: 176 ng/mL (ref 10–291)

## 2012-04-22 ENCOUNTER — Telehealth: Payer: Self-pay | Admitting: *Deleted

## 2012-04-22 NOTE — Telephone Encounter (Signed)
Message copied by Lucile Crater on Tue Apr 22, 2012  3:30 PM ------      Message from: Deatra Robinson      Created: Mon Apr 21, 2012 10:54 PM       Call patient: Iron studies normal

## 2012-04-22 NOTE — Telephone Encounter (Signed)
Per MD notified pt iron studies normal. Pt verbalized understanding

## 2012-05-23 ENCOUNTER — Other Ambulatory Visit (HOSPITAL_BASED_OUTPATIENT_CLINIC_OR_DEPARTMENT_OTHER): Payer: Commercial Managed Care - PPO | Admitting: Lab

## 2012-05-23 ENCOUNTER — Encounter: Payer: Self-pay | Admitting: Oncology

## 2012-05-23 ENCOUNTER — Telehealth: Payer: Self-pay | Admitting: Oncology

## 2012-05-23 ENCOUNTER — Ambulatory Visit (HOSPITAL_BASED_OUTPATIENT_CLINIC_OR_DEPARTMENT_OTHER): Payer: Commercial Managed Care - PPO | Admitting: Oncology

## 2012-05-23 VITALS — BP 114/74 | HR 80 | Temp 99.0°F | Ht 69.0 in | Wt 163.6 lb

## 2012-05-23 DIAGNOSIS — D649 Anemia, unspecified: Secondary | ICD-10-CM

## 2012-05-23 DIAGNOSIS — D509 Iron deficiency anemia, unspecified: Secondary | ICD-10-CM

## 2012-05-23 LAB — CBC WITH DIFFERENTIAL/PLATELET
BASO%: 0.5 % (ref 0.0–2.0)
Eosinophils Absolute: 0.1 10*3/uL (ref 0.0–0.5)
HCT: 38 % (ref 34.8–46.6)
HGB: 12.9 g/dL (ref 11.6–15.9)
MCHC: 34.1 g/dL (ref 31.5–36.0)
MONO#: 0.5 10*3/uL (ref 0.1–0.9)
NEUT#: 3.9 10*3/uL (ref 1.5–6.5)
NEUT%: 67 % (ref 38.4–76.8)
Platelets: 214 10*3/uL (ref 145–400)
WBC: 5.9 10*3/uL (ref 3.9–10.3)
lymph#: 1.3 10*3/uL (ref 0.9–3.3)

## 2012-05-23 LAB — COMPREHENSIVE METABOLIC PANEL
ALT: 13 U/L (ref 0–35)
CO2: 24 mEq/L (ref 19–32)
Calcium: 9 mg/dL (ref 8.4–10.5)
Chloride: 104 mEq/L (ref 96–112)
Creatinine, Ser: 0.73 mg/dL (ref 0.50–1.10)
Glucose, Bld: 98 mg/dL (ref 70–99)
Total Protein: 7.1 g/dL (ref 6.0–8.3)

## 2012-05-23 NOTE — Patient Instructions (Addendum)
Iron studies look good  Iron studies in November and then with my visit

## 2012-05-23 NOTE — Telephone Encounter (Signed)
gve the pt her nov,feb 2014 appt calendar

## 2012-05-23 NOTE — Progress Notes (Signed)
OFFICE PROGRESS NOTE  CC Dr. Sharrell Ku  DIAGNOSIS: 31 year old female with iron deficiency anemia secondary to malabsorption and easy bruising.  PRIOR THERAPY:  #1 patient has had parenteral iron for iron deficiency anemia. Currently her counts remain stable.  #2 easy bruising: Patient hasn't had a full workup without anything being revealing.  CURRENT THERAPY:Observation  INTERVAL HISTORY: Kaitlyn Johnson 31 y.o. female returns for Followup visit today. Since her last visit she has done well. She however tells me that she has been having diarrhea and abdominal pain chronically. In spite of extensive work up no clear cause has been found.She has not been able to eat much ans currently is able to only eat baby food. She has no shortness of breath, no fevers, no bleeding . She still continues to bruising but not as much as before.Remainder of the 10 point review of systems is negative.  MEDICAL HISTORY: Past Medical History  Diagnosis Date  . Anemia   . Clotting disorder     bruising easy  . Anemia 11/26/2011  . Easy bruising 11/26/2011  . POTS (postural orthostatic tachycardia syndrome) 11/26/2011    ALLERGIES:   has no known allergies.  MEDICATIONS:  Current Outpatient Prescriptions  Medication Sig Dispense Refill  . budesonide-formoterol (SYMBICORT) 80-4.5 MCG/ACT inhaler Inhale 2 puffs into the lungs 2 (two) times daily.      Marland Kitchen diltiazem (CARDIZEM) 90 MG tablet Take 185 mg by mouth daily.      . DULoxetine (CYMBALTA) 60 MG capsule Take 60 mg by mouth daily.      Marland Kitchen lamoTRIgine (LAMICTAL) 100 MG tablet Take 100 mg by mouth daily.      Marland Kitchen levalbuterol (XOPENEX HFA) 45 MCG/ACT inhaler Inhale 1-2 puffs into the lungs every 4 (four) hours as needed.      . metoprolol (LOPRESSOR) 50 MG tablet Take 75 mg by mouth 2 (two) times daily.      . montelukast (SINGULAIR) 10 MG tablet Take 10 mg by mouth at bedtime.      . vitamin B-12 (CYANOCOBALAMIN) 1000 MCG tablet Take 1,000 mcg by  mouth daily.      Marland Kitchen zolpidem (AMBIEN) 10 MG tablet Take 12 mg by mouth at bedtime as needed.        SURGICAL HISTORY: No past surgical history on file.  REVIEW OF SYSTEMS:  Pertinent items are noted in HPI.   PHYSICAL EXAMINATION: General appearance: alert, cooperative and appears stated age Head: Normocephalic, without obvious abnormality, atraumatic Neck: no adenopathy, no carotid bruit, no JVD, supple, symmetrical, trachea midline and thyroid not enlarged, symmetric, no tenderness/mass/nodules Lymph nodes: Cervical, supraclavicular, and axillary nodes normal. Resp: clear to auscultation bilaterally and normal percussion bilaterally Back: symmetric, no curvature. ROM normal. No CVA tenderness. Cardio: regular rate and rhythm, S1, S2 normal, no murmur, click, rub or gallop GI: soft, non-tender; bowel sounds normal; no masses,  no organomegaly Extremities: extremities normal, atraumatic, no cyanosis or edema and she is noted to have some bruising on the anterior shins. She also has one area of bruising in the upper thigh laterally. Neurologic: Alert and oriented X 3, normal strength and tone. Normal symmetric reflexes. Normal coordination and gait  ECOG PERFORMANCE STATUS: 0 - Asymptomatic  Blood pressure 114/74, pulse 80, temperature 99 F (37.2 C), temperature source Oral, height 5\' 9"  (1.753 m), weight 163 lb 9.6 oz (74.208 kg).  LABORATORY DATA: Lab Results  Component Value Date   WBC 5.9 05/23/2012   HGB 12.9 05/23/2012  HCT 38.0 05/23/2012   MCV 98.4 05/23/2012   PLT 214 05/23/2012       RADIOGRAPHIC STUDIES:  No results found.  ASSESSMENT: 31 year old female with  #1 iron deficiency anemia secondary to malabsorption.  #2 easy bruising unclear etiology workup unrevealing.  #3 pots being worked up [employer]. Lynnea Ferrier at [facility] cardiology.   PLAN:   #1 patient's CBC looks terrific at this time there is no need for iron infusions. We will continue to monitor  her.  #2 easy bruising continue monitoring.   All questions were answered. The patient knows to call the clinic with any problems, questions or concerns. We can certainly see the patient much sooner if necessary.  I spent 20 minutes counseling the patient face to face. The total time spent in the appointment was 30 minutes.    Drue Second, MD Medical/Oncology [facility] [phone] (beeper) [phone] (Office)  05/23/2012, 12:24 PM

## 2012-06-02 ENCOUNTER — Encounter (HOSPITAL_BASED_OUTPATIENT_CLINIC_OR_DEPARTMENT_OTHER): Payer: Self-pay | Admitting: *Deleted

## 2012-06-02 ENCOUNTER — Emergency Department (HOSPITAL_BASED_OUTPATIENT_CLINIC_OR_DEPARTMENT_OTHER)
Admission: EM | Admit: 2012-06-02 | Discharge: 2012-06-02 | Disposition: A | Discharge status: Home / Self Care | Payer: Commercial Managed Care - PPO | Attending: Emergency Medicine | Admitting: Emergency Medicine

## 2012-06-02 DIAGNOSIS — T63441A Toxic effect of venom of bees, accidental (unintentional), initial encounter: Secondary | ICD-10-CM

## 2012-06-02 DIAGNOSIS — Z79899 Other long term (current) drug therapy: Secondary | ICD-10-CM | POA: Insufficient documentation

## 2012-06-02 DIAGNOSIS — T63461A Toxic effect of venom of wasps, accidental (unintentional), initial encounter: Secondary | ICD-10-CM | POA: Insufficient documentation

## 2012-06-02 DIAGNOSIS — T6391XA Toxic effect of contact with unspecified venomous animal, accidental (unintentional), initial encounter: Secondary | ICD-10-CM | POA: Insufficient documentation

## 2012-06-02 MED ORDER — METHYLPREDNISOLONE SODIUM SUCC 125 MG IJ SOLR
INTRAMUSCULAR | Status: AC
  Administered 2012-06-02: 125 mg via INTRAVENOUS
  Filled 2012-06-02: qty 2

## 2012-06-02 MED ORDER — EPINEPHRINE 0.3 MG/0.3ML IJ DEVI: 0.3000 mg | INTRAMUSCULAR | Status: DC | PRN

## 2012-06-02 MED ORDER — FAMOTIDINE IN NACL 20-0.9 MG/50ML-% IV SOLN
20.0000 mg | Freq: Once | INTRAVENOUS | Status: AC
  Administered 2012-06-02: 20 mg via INTRAVENOUS

## 2012-06-02 MED ORDER — METHYLPREDNISOLONE SODIUM SUCC 125 MG IJ SOLR
125.0000 mg | Freq: Once | INTRAMUSCULAR | Status: AC
  Administered 2012-06-02: 125 mg via INTRAVENOUS

## 2012-06-02 MED ORDER — PREDNISONE 50 MG PO TABS: ORAL_TABLET | ORAL | Status: DC

## 2012-06-02 MED ORDER — SODIUM CHLORIDE 0.9 % IV BOLUS (SEPSIS)
1000.0000 mL | Freq: Once | INTRAVENOUS | Status: AC
  Administered 2012-06-02: 1000 mL via INTRAVENOUS

## 2012-06-02 MED ORDER — DIPHENHYDRAMINE HCL 50 MG/ML IJ SOLN
25.0000 mg | Freq: Once | INTRAMUSCULAR | Status: AC
  Administered 2012-06-02: 25 mg via INTRAVENOUS

## 2012-06-02 MED ORDER — DIPHENHYDRAMINE HCL 50 MG/ML IJ SOLN
INTRAMUSCULAR | Status: AC
  Administered 2012-06-02: 25 mg via INTRAVENOUS
  Filled 2012-06-02: qty 1

## 2012-06-02 MED ORDER — FAMOTIDINE IN NACL 20-0.9 MG/50ML-% IV SOLN
INTRAVENOUS | Status: AC
  Administered 2012-06-02: 20 mg via INTRAVENOUS
  Filled 2012-06-02: qty 50

## 2012-06-02 NOTE — ED Notes (Signed)
Patient informed of 4 hour observation due to use of epi pen

## 2012-06-02 NOTE — ED Notes (Signed)
Bee stung. Throat is swelling. She has used her Epi pen prior to arrival.

## 2012-06-03 NOTE — ED Provider Notes (Signed)
History     CSN: [id number]  Arrival date & time 06/02/12  1655   First MD Initiated Contact with Patient 06/02/12 1717      Chief Complaint  Patient presents with  . Allergic Reaction    (Consider location/radiation/quality/duration/timing/severity/associated sxs/prior treatment) HPI Pt presenting with c/o bee sting- she has a hx of allergy to bee stings and carries epipen.  She used epipen  X 2 prior to arrival.  [place] on hand and back.  No difficulty breathing, no lip or tongue swelling, no vomiting or fainting.  Did feel that her voice was getting hoarse.  Upon arrival to ED feels jittery but continues to have no other symptom.  There are no other associated systemic symptoms, there are no other alleviating or modifying factors.   Past Medical History  Diagnosis Date  . Anemia   . Clotting disorder     bruising easy  . Anemia 11/26/2011  . Easy bruising 11/26/2011  . POTS (postural orthostatic tachycardia syndrome) 11/26/2011    Past Surgical History  Procedure Date  . Abdominal hysterectomy     Family History  Problem Relation Age of Onset  . Hypertension Mother     History  Substance Use Topics  . Smoking status: Never Smoker   . Smokeless tobacco: Never Used  . Alcohol Use: 3.0 oz/week    5 Glasses of wine per week    OB History    Grav Para Term Preterm Abortions TAB SAB Ect Mult Living                  Review of Systems ROS reviewed and all otherwise negative except for mentioned in HPI  Allergies  Shellfish allergy; Dilaudid; Reglan; Wheat bran; Sulfa antibiotics; and Tartrazine  Home Medications   Current Outpatient Rx  Name Route Sig Dispense Refill  . ALBUTEROL SULFATE HFA 108 (90 BASE) MCG/ACT IN AERS Inhalation Inhale 2 puffs into the lungs every 6 (six) hours as needed. For shortness of breath    . CYANOCOBALAMIN 1000 MCG/ML IJ SOLN Intramuscular Inject 1,000 mcg into the muscle every 30 (thirty) days.    Marland Kitchen DILTIAZEM HCL ER 180 MG PO CP24  Oral Take 180 mg by mouth daily.    . DULOXETINE HCL 60 MG PO CPEP Oral Take 60 mg by mouth daily.    Marland Kitchen EPINEPHRINE 0.3 MG/0.3ML IJ DEVI Intramuscular Inject 0.3 mg into the muscle once.    Marland Kitchen LAMOTRIGINE 100 MG PO TABS Oral Take 100 mg by mouth 2 (two) times daily.     Marland Kitchen LEVALBUTEROL TARTRATE 45 MCG/ACT IN AERO Inhalation Inhale 1-2 puffs into the lungs every 4 (four) hours as needed. For shortness of breath    . MESALAMINE 400 MG PO TBEC Oral Take 800 mg by mouth daily.    Marland Kitchen METOPROLOL TARTRATE 50 MG PO TABS Oral Take 75 mg by mouth 2 (two) times daily.    Marland Kitchen MONTELUKAST SODIUM 10 MG PO TABS Oral Take 10 mg by mouth at bedtime.    Marland Kitchen ZOLPIDEM TARTRATE 10 MG PO TABS Oral Take 10 mg by mouth at bedtime as needed. For sleep    . EPINEPHRINE 0.3 MG/0.3ML IJ DEVI Intramuscular Inject 0.3 mLs (0.3 mg total) into the muscle as needed. 2 Device 1  . PREDNISONE 50 MG PO TABS  Take 1 po qD x 4 days 4 tablet 0    BP 127/71  Pulse 90  Temp 98 F (36.7 C) (Oral)  Resp 16  SpO2 100% Vitals reviewed Physical Exam Physical Examination: General appearance - alert, well appearing, and in no distress Mental status - alert, oriented to person, place, and time Eyes - no scleral icterus Mouth - mucous membranes moist, pharynx normal without lesions, no lip or tongue swelling, no OP edema, uvula midine Chest - clear to auscultation, no wheezes, rales or rhonchi, symmetric air entry Heart - normal rate, regular rhythm, normal S1, S2, no murmurs, rubs, clicks or gallops Abdomen - soft, nontender, nondistended, no masses or organomegaly Musculoskeletal - no joint tenderness, deformity or swelling Extremities - peripheral pulses normal, no pedal edema, no clubbing or cyanosis Skin - normal coloration and turgor, area of erythema overlying right upper back and left hand c/w bee stings Psych- anxious  ED Course  Procedures (including critical care time)  Labs Reviewed - No data to display No results  found.   1. Allergic reaction to bee sting       MDM  Pt with hx of allergy to bee stings- she had used epipen x 2 prior to arrival.  Pt treated in ED with benadryl, solumedrol, pepcid.  Observed x 4 hours after epipen to ensure no rebound.  Pt discharged in improved condition.  rx given for steroids as well as epipens.  Discharged with strict return precautions.  Pt agreeable with plan.        Ethelda Chick, MD 06/03/12 1535

## 2012-06-12 ENCOUNTER — Emergency Department (HOSPITAL_BASED_OUTPATIENT_CLINIC_OR_DEPARTMENT_OTHER)
Admission: EM | Admit: 2012-06-12 | Discharge: 2012-06-13 | Disposition: A | Discharge status: Home / Self Care | Payer: Commercial Managed Care - PPO | Attending: Emergency Medicine | Admitting: Emergency Medicine

## 2012-06-12 ENCOUNTER — Encounter (HOSPITAL_BASED_OUTPATIENT_CLINIC_OR_DEPARTMENT_OTHER): Payer: Self-pay | Admitting: *Deleted

## 2012-06-12 ENCOUNTER — Emergency Department (HOSPITAL_BASED_OUTPATIENT_CLINIC_OR_DEPARTMENT_OTHER): Payer: Commercial Managed Care - PPO

## 2012-06-12 DIAGNOSIS — R7989 Other specified abnormal findings of blood chemistry: Secondary | ICD-10-CM | POA: Insufficient documentation

## 2012-06-12 DIAGNOSIS — R1013 Epigastric pain: Secondary | ICD-10-CM | POA: Insufficient documentation

## 2012-06-12 DIAGNOSIS — Z79899 Other long term (current) drug therapy: Secondary | ICD-10-CM | POA: Insufficient documentation

## 2012-06-12 DIAGNOSIS — R945 Abnormal results of liver function studies: Secondary | ICD-10-CM

## 2012-06-12 LAB — CBC WITH DIFFERENTIAL/PLATELET
Basophils Relative: 1 % (ref 0–1)
Eosinophils Absolute: 0.1 10*3/uL (ref 0.0–0.7)
HCT: 38.9 % (ref 36.0–46.0)
Hemoglobin: 13.5 g/dL (ref 12.0–15.0)
Lymphs Abs: 1.4 10*3/uL (ref 0.7–4.0)
MCH: 32.9 pg (ref 26.0–34.0)
MCHC: 34.7 g/dL (ref 30.0–36.0)
Monocytes Absolute: 0.7 10*3/uL (ref 0.1–1.0)
Monocytes Relative: 11 % (ref 3–12)
Neutro Abs: 4.2 10*3/uL (ref 1.7–7.7)

## 2012-06-12 LAB — COMPREHENSIVE METABOLIC PANEL
ALT: 198 U/L — ABNORMAL HIGH (ref 0–35)
Albumin: 4.7 g/dL (ref 3.5–5.2)
Alkaline Phosphatase: 163 U/L — ABNORMAL HIGH (ref 39–117)
Potassium: 3.4 mEq/L — ABNORMAL LOW (ref 3.5–5.1)
Sodium: 138 mEq/L (ref 135–145)
Total Protein: 8 g/dL (ref 6.0–8.3)

## 2012-06-12 MED ORDER — FENTANYL CITRATE 0.05 MG/ML IJ SOLN
50.0000 ug | Freq: Once | INTRAMUSCULAR | Status: AC
  Administered 2012-06-12: 50 ug via INTRAVENOUS
  Filled 2012-06-12: qty 2

## 2012-06-12 MED ORDER — OXYCODONE-ACETAMINOPHEN 7.5-325 MG PO TABS: 1.0000 | ORAL_TABLET | ORAL | Status: AC | PRN

## 2012-06-12 MED ORDER — GI COCKTAIL ~~LOC~~
30.0000 mL | Freq: Once | ORAL | Status: AC
  Administered 2012-06-12: 30 mL via ORAL
  Filled 2012-06-12: qty 30

## 2012-06-12 MED ORDER — ONDANSETRON 4 MG PO TBDP
4.0000 mg | ORAL_TABLET | Freq: Once | ORAL | Status: AC
  Administered 2012-06-12: 4 mg via ORAL
  Filled 2012-06-12: qty 1

## 2012-06-12 MED ORDER — ONDANSETRON 4 MG PO TBDP: 4.0000 mg | ORAL_TABLET | Freq: Three times a day (TID) | ORAL | Status: AC | PRN

## 2012-06-12 MED ORDER — MORPHINE SULFATE 4 MG/ML IJ SOLN
4.0000 mg | Freq: Once | INTRAMUSCULAR | Status: DC
  Filled 2012-06-12: qty 1

## 2012-06-12 NOTE — ED Notes (Signed)
Pt. Saw her GI Dr today and reports she has had diarrhea for approx. 4 mths.  Pt. Reports she has been seeing her GI Dr. For poss. Pancreatic trouble and today she was told to come to ED due to the chest pain.

## 2012-06-12 NOTE — ED Notes (Signed)
Pt continue to c/o stabbing pain mid chest to right side shoulder blade pain 7/10

## 2012-06-12 NOTE — ED Provider Notes (Signed)
Medical screening examination/treatment/procedure(s) were conducted as a shared visit with non-physician practitioner(s) and myself.  I personally evaluated the patient during the encounter    Nelia Shi, MD 06/12/12 2321

## 2012-06-12 NOTE — ED Notes (Signed)
MD at bedside. 

## 2012-06-12 NOTE — ED Provider Notes (Signed)
History     CSN: [id number]  Arrival date & time 06/12/12  1478   First MD Initiated Contact with Patient 06/12/12 1935      Chief Complaint  Patient presents with  . Chest Pain    mid sternal per Pt. is pain 7/10 and it radiates to the L neck and the R under breast area.    (Consider location/radiation/quality/duration/timing/severity/associated sxs/prior treatment) HPI Comments: Patient comes in today with a chief complaint of epigastric abdominal pain.  Pain radiates around her chest inferior to the right breast and into the back between her scapula.  Pain has been present for the past 2 hours and has been constant.  She has tried taking Paregoric for the pain, which caused mild relief.  She had a similar episode of pain to this last evening.  She reports that the pain feels similar to when she has a "gallbladder attack" in the past.  She did have a Cholecystectomy back in 2001.  She has been nauseous, but no vomiting.  She has been followed by Dr. Kinnie Scales GI due to diarrhea over the past 4 weeks.  She spoke with Dr. Kinnie Scales prior to coming to the ED.  She states that she was told that it may be a biliary obstruction.  Patient denies any shortness of breath.  Nothing makes the pain worse.      The history is provided by the patient.    Past Medical History  Diagnosis Date  . Anemia   . Clotting disorder     bruising easy  . Anemia 11/26/2011  . Easy bruising 11/26/2011  . POTS (postural orthostatic tachycardia syndrome) 11/26/2011    Past Surgical History  Procedure Date  . Abdominal hysterectomy   . Laparoscopic endometriosis fulguration   . Roux-en-y procedure     Family History  Problem Relation Age of Onset  . Hypertension Mother     History  Substance Use Topics  . Smoking status: Never Smoker   . Smokeless tobacco: Never Used  . Alcohol Use: 3.0 oz/week    5 Glasses of wine per week    OB History    Grav Para Term Preterm Abortions TAB SAB Ect Mult Living               Review of Systems  Constitutional: Negative for fever and chills.  Respiratory: Negative for cough and shortness of breath.   Cardiovascular: Positive for chest pain.  Gastrointestinal: Positive for nausea, abdominal pain and diarrhea. Negative for vomiting, constipation and blood in stool.  Genitourinary: Negative for dysuria, urgency and frequency.  Skin: Negative for rash.  Neurological: Negative for dizziness, syncope and light-headedness.    Allergies  Shellfish allergy; Dilaudid; Reglan; Wheat bran; Sulfa antibiotics; and Tartrazine  Home Medications   Current Outpatient Rx  Name Route Sig Dispense Refill  . PAREGORIC 2 MG/5ML PO TINC Oral Take 5 mLs by mouth 4 (four) times daily as needed.    Marland Kitchen PREDNISONE 50 MG PO TABS  Take 1 po qD x 4 days 4 tablet 0  . ALBUTEROL SULFATE HFA 108 (90 BASE) MCG/ACT IN AERS Inhalation Inhale 2 puffs into the lungs every 6 (six) hours as needed. For shortness of breath    . CYANOCOBALAMIN 1000 MCG/ML IJ SOLN Intramuscular Inject 1,000 mcg into the muscle every 30 (thirty) days.    Marland Kitchen DILTIAZEM HCL ER 180 MG PO CP24 Oral Take 180 mg by mouth daily.    . DULOXETINE HCL 60 MG PO  CPEP Oral Take 60 mg by mouth daily.    Marland Kitchen EPINEPHRINE 0.3 MG/0.3ML IJ DEVI Intramuscular Inject 0.3 mg into the muscle once.    Marland Kitchen EPINEPHRINE 0.3 MG/0.3ML IJ DEVI Intramuscular Inject 0.3 mLs (0.3 mg total) into the muscle as needed. 2 Device 1  . LAMOTRIGINE 100 MG PO TABS Oral Take 100 mg by mouth 2 (two) times daily.     Marland Kitchen LEVALBUTEROL TARTRATE 45 MCG/ACT IN AERO Inhalation Inhale 1-2 puffs into the lungs every 4 (four) hours as needed. For shortness of breath    . MESALAMINE 400 MG PO TBEC Oral Take 800 mg by mouth daily.    Marland Kitchen METOPROLOL TARTRATE 50 MG PO TABS Oral Take 75 mg by mouth 2 (two) times daily.    Marland Kitchen MONTELUKAST SODIUM 10 MG PO TABS Oral Take 10 mg by mouth at bedtime.    Marland Kitchen ZOLPIDEM TARTRATE 10 MG PO TABS Oral Take 10 mg by mouth at bedtime as  needed. For sleep      BP 120/75  Pulse 84  Temp 97.8 F (36.6 C) (Oral)  Resp 16  Ht 5\' 9"  (1.753 m)  Wt 162 lb (73.483 kg)  BMI 23.92 kg/m2  SpO2 100%  Physical Exam  Nursing note and vitals reviewed. Constitutional: She appears well-developed and well-nourished. No distress.  HENT:  Head: Normocephalic and atraumatic.  Mouth/Throat: Oropharynx is clear and moist.  Neck: Normal range of motion. Neck supple.  Cardiovascular: Normal rate, regular rhythm and normal heart sounds.   Pulmonary/Chest: Effort normal and breath sounds normal. No respiratory distress. She has no wheezes. She has no rales. She exhibits no tenderness.  Abdominal: Soft. Normal appearance and bowel sounds are normal. She exhibits no distension and no mass. There is tenderness in the right upper quadrant and epigastric area. There is positive Massmann's sign. There is no rigidity, no rebound, no guarding and no tenderness at McBurney's point.  Neurological: She is alert.  Skin: Skin is warm and dry. She is not diaphoretic.  Psychiatric: She has a normal mood and affect.    ED Course  Procedures (including critical care time)   Labs Reviewed  COMPREHENSIVE METABOLIC PANEL  LIPASE, BLOOD  CBC WITH DIFFERENTIAL   No results found.   Date: 06/12/2012  Rate: 83  Rhythm: normal sinus rhythm  QRS Axis: normal  Intervals: normal  ST/T Wave abnormalities: normal  Conduction Disutrbances:none  Narrative Interpretation:   Old EKG Reviewed: none available    No diagnosis found.  Dr. Radford Pax has discussed with Dr. Kinnie Scales with GI.  He is recommending ordering an Abdominal Ultrasound to evaluate for biliary duct dilation.    10:24 PM Patient signed out to Dr. Radford Pax who will follow up on Abdominal Ultrasound. MDM  Patient presents today with RUQ abdominal pain and epigastric pain.  She had her Gallbladder removed in 2001.  She has been nauseous, but no vomiting.  Her AST, ALT, and Alk Phos were found to be  significantly elevated.  GI was consulted and an Abdominal Ultrasound has been ordered.  Results are pending.        Pascal Lux [place], PA-C 06/12/12 2228  Pascal Lux [place], PA-C 06/12/12 2231

## 2012-06-12 NOTE — ED Notes (Signed)
Pt. Reports she had her gall bladder taken out in 2001.

## 2012-06-13 MED ORDER — FENTANYL CITRATE 0.05 MG/ML IJ SOLN
50.0000 ug | Freq: Once | INTRAMUSCULAR | Status: AC
  Administered 2012-06-13: 50 ug via INTRAVENOUS
  Filled 2012-06-13: qty 2

## 2012-06-26 ENCOUNTER — Emergency Department (HOSPITAL_BASED_OUTPATIENT_CLINIC_OR_DEPARTMENT_OTHER)
Admission: EM | Admit: 2012-06-26 | Discharge: 2012-06-26 | Disposition: A | Discharge status: Home / Self Care | Payer: Commercial Managed Care - PPO | Attending: Emergency Medicine | Admitting: Emergency Medicine

## 2012-06-26 ENCOUNTER — Emergency Department (HOSPITAL_BASED_OUTPATIENT_CLINIC_OR_DEPARTMENT_OTHER): Payer: Commercial Managed Care - PPO

## 2012-06-26 ENCOUNTER — Encounter (HOSPITAL_BASED_OUTPATIENT_CLINIC_OR_DEPARTMENT_OTHER): Payer: Self-pay | Admitting: Emergency Medicine

## 2012-06-26 DIAGNOSIS — I498 Other specified cardiac arrhythmias: Secondary | ICD-10-CM | POA: Insufficient documentation

## 2012-06-26 DIAGNOSIS — D649 Anemia, unspecified: Secondary | ICD-10-CM | POA: Insufficient documentation

## 2012-06-26 DIAGNOSIS — K5900 Constipation, unspecified: Secondary | ICD-10-CM | POA: Insufficient documentation

## 2012-06-26 HISTORY — DX: Von Willebrand disease, unspecified: D68.00

## 2012-06-26 HISTORY — DX: Von Willebrand disease: D68.0

## 2012-06-26 LAB — CBC WITH DIFFERENTIAL/PLATELET
Basophils Absolute: 0 10*3/uL (ref 0.0–0.1)
Eosinophils Relative: 2 % (ref 0–5)
Lymphocytes Relative: 49 % — ABNORMAL HIGH (ref 12–46)
Lymphs Abs: 2.4 10*3/uL (ref 0.7–4.0)
MCV: 95.3 fL (ref 78.0–100.0)
Neutro Abs: 1.8 10*3/uL (ref 1.7–7.7)
Neutrophils Relative %: 37 % — ABNORMAL LOW (ref 43–77)
Platelets: 232 10*3/uL (ref 150–400)
RBC: 3.86 MIL/uL — ABNORMAL LOW (ref 3.87–5.11)
WBC: 4.8 10*3/uL (ref 4.0–10.5)

## 2012-06-26 LAB — COMPREHENSIVE METABOLIC PANEL
Albumin: 4 g/dL (ref 3.5–5.2)
BUN: 6 mg/dL (ref 6–23)
Chloride: 103 mEq/L (ref 96–112)
Creatinine, Ser: 0.7 mg/dL (ref 0.50–1.10)
GFR calc Af Amer: >90 mL/min (ref >90)
Glucose, Bld: 89 mg/dL (ref 70–99)
Total Bilirubin: 0.2 mg/dL — ABNORMAL LOW (ref 0.3–1.2)

## 2012-06-26 LAB — PREGNANCY, URINE: Preg Test, Ur: NEGATIVE

## 2012-06-26 LAB — URINALYSIS, ROUTINE W REFLEX MICROSCOPIC
Nitrite: NEGATIVE
Specific Gravity, Urine: 1.015 (ref 1.005–1.030)
Urobilinogen, UA: 0.2 mg/dL (ref 0.0–1.0)
pH: 5.5 (ref 5.0–8.0)

## 2012-06-26 LAB — URINE MICROSCOPIC-ADD ON

## 2012-06-26 MED ORDER — FENTANYL CITRATE 0.05 MG/ML IJ SOLN
100.0000 ug | Freq: Once | INTRAMUSCULAR | Status: AC
  Administered 2012-06-26: 100 ug via INTRAVENOUS
  Filled 2012-06-26: qty 2

## 2012-06-26 MED ORDER — ONDANSETRON HCL 4 MG/2ML IJ SOLN
4.0000 mg | Freq: Once | INTRAMUSCULAR | Status: AC
  Administered 2012-06-26: 4 mg via INTRAVENOUS
  Filled 2012-06-26: qty 2

## 2012-06-26 MED ORDER — SODIUM CHLORIDE 0.9 % IV SOLN
INTRAVENOUS | Status: DC
  Administered 2012-06-26: 04:00:00 via INTRAVENOUS

## 2012-06-26 NOTE — ED Notes (Signed)
Recently at [facility] for chole lithiasis, came home started to develop bloating yesterday , pt spoke with [facility] md who suggested she come in tonight for evaluation

## 2012-06-26 NOTE — ED Provider Notes (Signed)
History     CSN: [id number]  Arrival date & time 06/26/12  0315   First MD Initiated Contact with Patient 06/26/12 [phone]      Chief Complaint  Patient presents with  . Abdominal Pain    (Consider location/radiation/quality/duration/timing/severity/associated sxs/prior treatment) HPI This is a 31 year old white female with a remote history of cholecystectomy and a recent history of choledocholithiasis which resolved without any invasive procedure. She's not had a bowel movement in the last 4 days. This may partly be due to to lack of appetite. She states she has been drinking adequately but has not been hungry. Since about 6 PM yesterday evening she has had worsening abdominal pain and distention. She has not passed gas during that period. She contacted her specialist at [facility] who was concern for small bowel obstruction and advised that she be seen urgently. She has been n.p.o. for the past 6 hours. She denies nausea or vomiting. She states she is thirsty. Her pain is moderate and she describes her abdominal distention is feeling like she is 4 months pregnant. She has had no fever but has had chills.  Past Medical History  Diagnosis Date  . Anemia   . Clotting disorder     bruising easy  . Anemia 11/26/2011  . Easy bruising 11/26/2011  . POTS (postural orthostatic tachycardia syndrome) 11/26/2011    Past Surgical History  Procedure Date  . Abdominal hysterectomy   . Laparoscopic endometriosis fulguration   . Roux-en-y procedure     Family History  Problem Relation Age of Onset  . Hypertension Mother     History  Substance Use Topics  . Smoking status: Never Smoker   . Smokeless tobacco: Never Used  . Alcohol Use: 3.0 oz/week    5 Glasses of wine per week    OB History    Grav Para Term Preterm Abortions TAB SAB Ect Mult Living                  Review of Systems  All other systems reviewed and are negative.    Allergies  Shellfish allergy; Dilaudid; Reglan;  Wheat bran; Sulfa antibiotics; and Tartrazine  Home Medications   Current Outpatient Rx  Name Route Sig Dispense Refill  . ALBUTEROL SULFATE HFA 108 (90 BASE) MCG/ACT IN AERS Inhalation Inhale 2 puffs into the lungs every 6 (six) hours as needed. For shortness of breath    . BUDESONIDE-FORMOTEROL FUMARATE 80-4.5 MCG/ACT IN AERO Inhalation Inhale 2 puffs into the lungs daily.    . CYANOCOBALAMIN 1000 MCG/ML IJ SOLN Intramuscular Inject 1,000 mcg into the muscle every 30 (thirty) days.    Marland Kitchen DILTIAZEM HCL ER 180 MG PO CP24 Oral Take 180 mg by mouth daily.    . DULOXETINE HCL 60 MG PO CPEP Oral Take 60 mg by mouth daily.    Marland Kitchen EPINEPHRINE 0.3 MG/0.3ML IJ DEVI Intramuscular Inject 0.3 mLs (0.3 mg total) into the muscle as needed. 2 Device 1  . LAMOTRIGINE 100 MG PO TABS Oral Take 100 mg by mouth 2 (two) times daily.     Marland Kitchen LEVALBUTEROL TARTRATE 45 MCG/ACT IN AERO Inhalation Inhale 1-2 puffs into the lungs every 4 (four) hours as needed. For shortness of breath    . METOPROLOL TARTRATE 50 MG PO TABS Oral Take 75 mg by mouth 2 (two) times daily.    Marland Kitchen MONTELUKAST SODIUM 10 MG PO TABS Oral Take 10 mg by mouth at bedtime.    Marland Kitchen PAREGORIC 2 MG/5ML  PO TINC Oral Take 5 mLs by mouth 4 (four) times daily as needed. For diarrhea    . SIMETHICONE 80 MG PO CHEW Oral Chew 80 mg by mouth 2 (two) times daily as needed. For stomach pain    . ZOLPIDEM TARTRATE 10 MG PO TABS Oral Take 10 mg by mouth at bedtime as needed. For sleep      BP 128/78  Pulse 78  Temp 98.5 F (36.9 C) (Oral)  Resp 18  SpO2 98%  Physical Exam General: Well-developed, well-nourished female in no acute distress; appearance consistent with age of record HENT: normocephalic, atraumatic Eyes: pupils equal round and reactive to light; extraocular muscles intact Neck: supple Heart: regular rate and rhythm Lungs: clear to auscultation bilaterally Abdomen: soft; mildly distended; lower abdominal tenderness; bowel sounds present Extremities:  No deformity; full range of motion Neurologic: Awake, alert and oriented; motor function intact in all extremities and symmetric; no facial droop Skin: Warm and dry     ED Course  Procedures (including critical care time)     MDM   Nursing notes and vitals signs, including pulse oximetry, reviewed.  Summary of this visit's results, reviewed by myself:  Labs:  Results for orders placed during the hospital encounter of 06/26/12  COMPREHENSIVE METABOLIC PANEL      Component Value Range   Sodium 137  135 - 145 mEq/L   Potassium 3.7  3.5 - 5.1 mEq/L   Chloride 103  96 - 112 mEq/L   CO2 24  19 - 32 mEq/L   Glucose, Bld 89  70 - 99 mg/dL   BUN 6  6 - 23 mg/dL   Creatinine, Ser 1.61  0.50 - 1.10 mg/dL   Calcium 9.3  8.4 - 09.6 mg/dL   Total Protein 7.3  6.0 - 8.3 g/dL   Albumin 4.0  3.5 - 5.2 g/dL   AST 80 (*) 0 - 37 U/L   ALT 154 (*) 0 - 35 U/L   Alkaline Phosphatase 209 (*) 39 - 117 U/L   Total Bilirubin 0.2 (*) 0.3 - 1.2 mg/dL   GFR calc non Af Amer >90  >90 mL/min   GFR calc Af Amer >90  >90 mL/min  CBC WITH DIFFERENTIAL      Component Value Range   WBC 4.8  4.0 - 10.5 K/uL   RBC 3.86 (*) 3.87 - 5.11 MIL/uL   Hemoglobin 12.7  12.0 - 15.0 g/dL   HCT 04.5  40.9 - 81.1 %   MCV 95.3  78.0 - 100.0 fL   MCH 32.9  26.0 - 34.0 pg   MCHC 34.5  30.0 - 36.0 g/dL   RDW 91.4  78.2 - 95.6 %   Platelets 232  150 - 400 K/uL   Neutrophils Relative 37 (*) 43 - 77 %   Neutro Abs 1.8  1.7 - 7.7 K/uL   Lymphocytes Relative 49 (*) 12 - 46 %   Lymphs Abs 2.4  0.7 - 4.0 K/uL   Monocytes Relative 12  3 - 12 %   Monocytes Absolute 0.6  0.1 - 1.0 K/uL   Eosinophils Relative 2  0 - 5 %   Eosinophils Absolute 0.1  0.0 - 0.7 K/uL   Basophils Relative 0  0 - 1 %   Basophils Absolute 0.0  0.0 - 0.1 K/uL  PREGNANCY, URINE      Component Value Range   Preg Test, Ur NEGATIVE  NEGATIVE  URINALYSIS, ROUTINE W REFLEX MICROSCOPIC  Component Value Range   Color, Urine YELLOW  YELLOW    APPearance CLEAR  CLEAR   Specific Gravity, Urine 1.015  1.005 - 1.030   pH 5.5  5.0 - 8.0   Glucose, UA NEGATIVE  NEGATIVE mg/dL   Hgb urine dipstick NEGATIVE  NEGATIVE   Bilirubin Urine NEGATIVE  NEGATIVE   Ketones, ur NEGATIVE  NEGATIVE mg/dL   Protein, ur NEGATIVE  NEGATIVE mg/dL   Urobilinogen, UA 0.2  0.0 - 1.0 mg/dL   Nitrite NEGATIVE  NEGATIVE   Leukocytes, UA TRACE (*) NEGATIVE  LIPASE, BLOOD      Component Value Range   Lipase 50  11 - 59 U/L  URINE MICROSCOPIC-ADD ON      Component Value Range   Squamous Epithelial / LPF FEW (*) RARE   WBC, UA 3-6  <3 WBC/hpf   RBC / HPF 0-2  <3 RBC/hpf   Bacteria, UA FEW (*) RARE   Casts HYALINE CASTS (*) NEGATIVE   Urine-Other MUCOUS PRESENT      Imaging Studies: Dg Abd Acute W/chest  06/26/2012  *RADIOLOGY REPORT*  Clinical Data: Abdominal pain and distension.  ACUTE ABDOMEN SERIES (ABDOMEN 2 VIEW & CHEST 1 VIEW)  Comparison: 10/02/2004 CT  Findings: Lungs are clear.  Cardiomediastinal contours are within normal limits.  Surgical clips right upper quadrant.  Nonobstructive bowel gas pattern.  No free intraperitoneal air. Tubular radiopaque density projecting over the right lower quadrant may correspond to contrast within the appendix if the patient has had a recent prior examination.  Organ outlines normal where seen.  No acute osseous finding.  IMPRESSION: Nonobstructive bowel gas pattern.   Original Report Authenticated By: Waneta Martins, M.D.    4:40 AM Large stool burden seen on plain films are consistent with constipation. The patient has been taking cholestyramine for chronic diarrhea. She was advised to discontinue this and try MiraLAX until her bowels are moving. She was advised to return for worsening symptoms.        Hanley Seamen, MD 06/26/12 [phone]

## 2012-06-26 NOTE — ED Notes (Signed)
Pt reports severe bloating x24 hr, spoke with gi md at [facility], was told to come to er for kub, pt reports last bm 8/25, no flatus

## 2012-07-01 ENCOUNTER — Emergency Department (HOSPITAL_BASED_OUTPATIENT_CLINIC_OR_DEPARTMENT_OTHER)
Admission: EM | Admit: 2012-07-01 | Discharge: 2012-07-02 | Disposition: A | Discharge status: Home / Self Care | Payer: Commercial Managed Care - PPO | Attending: Emergency Medicine | Admitting: Emergency Medicine

## 2012-07-01 ENCOUNTER — Encounter (HOSPITAL_BASED_OUTPATIENT_CLINIC_OR_DEPARTMENT_OTHER): Payer: Self-pay | Admitting: *Deleted

## 2012-07-01 DIAGNOSIS — R10811 Right upper quadrant abdominal tenderness: Secondary | ICD-10-CM | POA: Insufficient documentation

## 2012-07-01 DIAGNOSIS — R6883 Chills (without fever): Secondary | ICD-10-CM | POA: Insufficient documentation

## 2012-07-01 DIAGNOSIS — Z90710 Acquired absence of both cervix and uterus: Secondary | ICD-10-CM | POA: Insufficient documentation

## 2012-07-01 DIAGNOSIS — Z9089 Acquired absence of other organs: Secondary | ICD-10-CM | POA: Insufficient documentation

## 2012-07-01 DIAGNOSIS — R109 Unspecified abdominal pain: Secondary | ICD-10-CM | POA: Insufficient documentation

## 2012-07-01 DIAGNOSIS — R110 Nausea: Secondary | ICD-10-CM | POA: Insufficient documentation

## 2012-07-01 LAB — URINALYSIS, ROUTINE W REFLEX MICROSCOPIC
Bilirubin Urine: NEGATIVE
Leukocytes, UA: NEGATIVE
Nitrite: NEGATIVE
Specific Gravity, Urine: 1.017 (ref 1.005–1.030)
Urobilinogen, UA: 0.2 mg/dL (ref 0.0–1.0)
pH: 6 (ref 5.0–8.0)

## 2012-07-01 NOTE — ED Notes (Signed)
Abdominal pain since 2pm. Took Tylenol with no relief. States she was recently treated for a stone in her bile duct and was a pt at [facility].

## 2012-07-02 ENCOUNTER — Emergency Department (HOSPITAL_BASED_OUTPATIENT_CLINIC_OR_DEPARTMENT_OTHER): Payer: Commercial Managed Care - PPO

## 2012-07-02 LAB — CBC WITH DIFFERENTIAL/PLATELET
Eosinophils Absolute: 0.1 10*3/uL (ref 0.0–0.7)
Eosinophils Relative: 1 % (ref 0–5)
HCT: 33.8 % — ABNORMAL LOW (ref 36.0–46.0)
Lymphocytes Relative: 45 % (ref 12–46)
Lymphs Abs: 2.9 10*3/uL (ref 0.7–4.0)
MCH: 32.5 pg (ref 26.0–34.0)
MCV: 93.9 fL (ref 78.0–100.0)
Monocytes Absolute: 0.7 10*3/uL (ref 0.1–1.0)
RBC: 3.6 MIL/uL — ABNORMAL LOW (ref 3.87–5.11)
RDW: 11.2 % — ABNORMAL LOW (ref 11.5–15.5)
WBC: 6.5 10*3/uL (ref 4.0–10.5)

## 2012-07-02 LAB — COMPREHENSIVE METABOLIC PANEL
AST: 22 U/L (ref 0–37)
Albumin: 4.4 g/dL (ref 3.5–5.2)
Alkaline Phosphatase: 141 U/L — ABNORMAL HIGH (ref 39–117)
BUN: 7 mg/dL (ref 6–23)
Chloride: 101 mEq/L (ref 96–112)
Potassium: 3.5 mEq/L (ref 3.5–5.1)
Total Bilirubin: 0.3 mg/dL (ref 0.3–1.2)
Total Protein: 7.6 g/dL (ref 6.0–8.3)

## 2012-07-02 MED ORDER — HYDROMORPHONE HCL 2 MG PO TABS: 2.0000 mg | ORAL_TABLET | ORAL | Status: AC | PRN

## 2012-07-02 MED ORDER — IOHEXOL 300 MG/ML  SOLN
100.0000 mL | Freq: Once | INTRAMUSCULAR | Status: AC | PRN
  Administered 2012-07-02: 100 mL via INTRAVENOUS

## 2012-07-02 MED ORDER — HYDROMORPHONE HCL PF 1 MG/ML IJ SOLN
1.0000 mg | Freq: Once | INTRAMUSCULAR | Status: AC
  Administered 2012-07-02: 1 mg via INTRAVENOUS
  Filled 2012-07-02: qty 1

## 2012-07-02 MED ORDER — DIPHENHYDRAMINE HCL 50 MG/ML IJ SOLN
25.0000 mg | Freq: Once | INTRAMUSCULAR | Status: AC
  Administered 2012-07-02: 25 mg via INTRAVENOUS
  Filled 2012-07-02: qty 1

## 2012-07-02 MED ORDER — IOHEXOL 300 MG/ML  SOLN
20.0000 mL | Freq: Once | INTRAMUSCULAR | Status: AC | PRN
  Administered 2012-07-02: 20 mL via ORAL

## 2012-07-02 MED ORDER — ONDANSETRON HCL 4 MG/2ML IJ SOLN
4.0000 mg | Freq: Once | INTRAMUSCULAR | Status: AC
  Administered 2012-07-02: 4 mg via INTRAVENOUS
  Filled 2012-07-02: qty 2

## 2012-07-02 MED ORDER — HYDROMORPHONE HCL PF 2 MG/ML IJ SOLN
2.0000 mg | Freq: Once | INTRAMUSCULAR | Status: AC
  Administered 2012-07-02: 2 mg via INTRAVENOUS
  Filled 2012-07-02: qty 1

## 2012-07-02 MED ORDER — MORPHINE SULFATE 4 MG/ML IJ SOLN
4.0000 mg | Freq: Once | INTRAMUSCULAR | Status: AC
  Administered 2012-07-02: 4 mg via INTRAVENOUS
  Filled 2012-07-02: qty 1

## 2012-07-02 NOTE — ED Provider Notes (Signed)
History     CSN: [id number]  Arrival date & time 07/01/12  2228   First MD Initiated Contact with Patient 07/02/12 0035      Chief Complaint  Patient presents with  . Abdominal Pain    (Consider location/radiation/quality/duration/timing/severity/associated sxs/prior treatment) HPI Comments: Pt has hx of abd pain - was told that had ? 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  Was seen at [facility] after her ER visit here and concluded that she had choledocholithiasis without doing the ERCP b/c her pain was getting better.  She had MRA and CT with contrast and after pain resolved she was d/c home.  Since then she has had decreased acdtivity b/c of transaminitis, pain has been low level (was recently seen here since then for r/o SBO but didn't have one *(was constipated)).  Tongight has RUQ pain, similar to prior pain from 2 weeks ago - no radiation.  Has some nausea intermittently.  Patient is a 31 y.o. female presenting with abdominal pain. The history is provided by the patient.  Abdominal Pain The primary symptoms of the illness include abdominal pain and nausea. The primary symptoms of the illness do not include fever or shortness of breath.  Additional symptoms associated with the illness include chills.    Past Medical History  Diagnosis Date  . Von Willebrand disease     bruising easy  . Anemia 11/26/2011  . POTS (postural orthostatic tachycardia syndrome) 11/26/2011    Past Surgical History  Procedure Date  . Abdominal hysterectomy   . Laparoscopic endometriosis fulguration   . Roux-en-y procedure   . Cholecystectomy     Family History  Problem Relation Age of Onset  . Hypertension Mother     History  Substance Use Topics  . Smoking status: Never Smoker   . Smokeless tobacco: Never Used  . Alcohol Use: 3.0 oz/week    5 Glasses of wine per week    OB History    Grav Para Term Preterm Abortions TAB SAB Ect Mult Living                  Review  of Systems  Constitutional: Positive for chills. Negative for fever.  Respiratory: Negative for cough and shortness of breath.   Cardiovascular: Negative for chest pain.  Gastrointestinal: Positive for nausea and abdominal pain.  All other systems reviewed and are negative.    Allergies  Shellfish allergy; Dilaudid; Reglan; Wheat bran; Sulfa antibiotics; and Tartrazine  Home Medications   Current Outpatient Rx  Name Route Sig Dispense Refill  . ACETAMINOPHEN 500 MG PO TABS Oral Take 1,000 mg by mouth every 6 (six) hours as needed. For pain    . BUDESONIDE-FORMOTEROL FUMARATE 80-4.5 MCG/ACT IN AERO Inhalation Inhale 2 puffs into the lungs daily.    . CYANOCOBALAMIN 1000 MCG/ML IJ SOLN Intramuscular Inject 1,000 mcg into the muscle every 30 (thirty) days.    Marland Kitchen DILTIAZEM HCL ER 180 MG PO CP24 Oral Take 180 mg by mouth daily.    . DULOXETINE HCL 60 MG PO CPEP Oral Take 60 mg by mouth daily.    Marland Kitchen EPINEPHRINE 0.3 MG/0.3ML IJ DEVI Intramuscular Inject 0.3 mLs (0.3 mg total) into the muscle as needed. 2 Device 1  . LAMOTRIGINE 100 MG PO TABS Oral Take 100 mg by mouth 2 (two) times daily.     Marland Kitchen METOPROLOL TARTRATE 50 MG PO TABS Oral Take 75 mg by mouth 2 (two)  times daily.    Marland Kitchen MONTELUKAST SODIUM 10 MG PO TABS Oral Take 10 mg by mouth at bedtime.    Marland Kitchen ZOLPIDEM TARTRATE 10 MG PO TABS Oral Take 10 mg by mouth at bedtime as needed. For sleep    . ALBUTEROL SULFATE HFA 108 (90 BASE) MCG/ACT IN AERS Inhalation Inhale 2 puffs into the lungs every 6 (six) hours as needed. For shortness of breath    . HYDROMORPHONE HCL 2 MG PO TABS Oral Take 1 tablet (2 mg total) by mouth every 4 (four) hours as needed for pain. 20 tablet 0  . LEVALBUTEROL TARTRATE 45 MCG/ACT IN AERO Inhalation Inhale 1-2 puffs into the lungs every 4 (four) hours as needed. For shortness of breath      BP 134/84  Pulse 78  Temp 98 F (36.7 C) (Oral)  Resp 20  SpO2 100%  Physical Exam  Nursing note and vitals  reviewed. Constitutional: She appears well-developed and well-nourished. No distress.  HENT:  Head: Normocephalic and atraumatic.  Mouth/Throat: Oropharynx is clear and moist. No oropharyngeal exudate.  Eyes: Conjunctivae and EOM are normal. Pupils are equal, round, and reactive to light. Right eye exhibits no discharge. Left eye exhibits no discharge. No scleral icterus.  Neck: Normal range of motion. Neck supple. No JVD present. No thyromegaly present.  Cardiovascular: Normal rate, regular rhythm, normal heart sounds and intact distal pulses.  Exam reveals no gallop and no friction rub.   No murmur heard. Pulmonary/Chest: Effort normal and breath sounds normal. No respiratory distress. She has no wheezes. She has no rales.  Abdominal: Soft. Bowel sounds are normal. She exhibits no distension and no mass. There is tenderness ( ttp in the RUQ, with mild guarding.  no peritoneal signs, no other ttp, soft abd.).  Musculoskeletal: Normal range of motion. She exhibits no edema and no tenderness.  Lymphadenopathy:    She has no cervical adenopathy.  Neurological: She is alert. Coordination normal.  Skin: Skin is warm and dry. No rash noted. No erythema.  Psychiatric: She has a normal mood and affect. Her behavior is normal.    ED Course  Procedures (including critical care time)  Labs Reviewed  COMPREHENSIVE METABOLIC PANEL - Abnormal; Notable for the following:    ALT 45 (*)     Alkaline Phosphatase 141 (*)     All other components within normal limits  CBC WITH DIFFERENTIAL - Abnormal; Notable for the following:    RBC 3.60 (*)     Hemoglobin 11.7 (*)     HCT 33.8 (*)     RDW 11.2 (*)     Neutrophils Relative 42 (*)     All other components within normal limits  LIPASE, BLOOD - Abnormal; Notable for the following:    Lipase 94 (*)     All other components within normal limits  URINALYSIS, ROUTINE W REFLEX MICROSCOPIC   Ct Abdomen Pelvis W Contrast  07/02/2012  *RADIOLOGY REPORT*   Clinical Data: Abdominal pain, right upper quadrant pain, nausea, diarrhea, history Roux-en-Y procedure, cholecystectomy, hysterectomy  CT ABDOMEN AND PELVIS WITH CONTRAST  Technique:  Multidetector CT imaging of the abdomen and pelvis was performed following the standard protocol during bolus administration of intravenous contrast.  Contrast: 56m OMNIPAQUE IOHEXOL 300 MG/ML  SOLN, 1011mOMNIPAQUE IOHEXOL 300 MG/ML  SOLN Dilute oral contrast.  Comparison: 10/02/2004  Findings: Lung bases clear. Gallbladder surgically absent. Minimal central intrahepatic biliary dilatation. Minimal focal fatty infiltration of liver adjacent the falciform fissure. CBD  7 mm diameter which may be normal post cholecystectomy. Liver, spleen, pancreas, kidneys, and adrenal glands otherwise normal appearance. Small splenule adjacent to spleen. Normal appendix.  Small amount nonspecific free pelvic fluid. Uterus surgically absent. Left ovarian cyst 4.1 x 2.9 cm image 79. Right ovary not visualized. Stomach and bowel loops unremarkable for technique. No mass, adenopathy, or hernia. No acute osseous findings.  IMPRESSION: Minimal intrahepatic and extrahepatic biliary dilatation, can be normal for post cholecystectomy, recommend correlation with LFTs. Small amount of nonspecific free pelvic fluid. Left ovarian cyst 4.1 x 2.9 x 3.4 cm.   Original Report Authenticated By: Burnetta Sabin, M.D.      1. Abdominal pain       MDM  Pain in the RUQ, check levels at her specialists request.  Has had persistently elevated levels though they are trending down.     2:00 AM, repeat exam, ongoing right upper quadrant pain. Laboratory data reviewed showing no leukocytosis but is very mild anemia, essentially normal LFTs but an elevated lipase at 95. Review of the medical record shows that this is elevated compared to prior values. Due to her ongoing symptoms a history of possible choledocholithiasis we'll perform CT scan. The patient is insistent  upon this test as well. She does have a local GI physician in [place]. Will reduce the pain medications, of note her VS appear normal and have been stable.   4:00 AM, patient has had significant relief with hydromorphone though the pain does wax and wane. Her abdomen is still soft and non-peritoneal. I have reviewed her CT scan with her and I have discussed her care with the gastroenterologist in [place] with whom she receives her care. They are both in agreement that the patient can followup in his office this afternoon. At this time the CT scan does not show any signs of significant biliary obstruction, intestinal obstruction or other intra-abdominal surgical pathology. She appears stable for discharge. She will be discharged with oral hydromorphone.  Johnna Acosta, MD 07/02/12 0430

## 2012-07-15 DIAGNOSIS — R945 Abnormal results of liver function studies: Secondary | ICD-10-CM | POA: Insufficient documentation

## 2012-07-16 HISTORY — PX: OTHER SURGICAL HISTORY: SHX169

## 2012-08-04 ENCOUNTER — Encounter (HOSPITAL_BASED_OUTPATIENT_CLINIC_OR_DEPARTMENT_OTHER): Payer: Self-pay | Admitting: *Deleted

## 2012-08-04 ENCOUNTER — Emergency Department (HOSPITAL_BASED_OUTPATIENT_CLINIC_OR_DEPARTMENT_OTHER)
Admission: EM | Admit: 2012-08-04 | Discharge: 2012-08-04 | Disposition: A | Discharge status: Home / Self Care | Payer: Commercial Managed Care - PPO | Attending: Emergency Medicine | Admitting: Emergency Medicine

## 2012-08-04 DIAGNOSIS — Z888 Allergy status to other drugs, medicaments and biological substances status: Secondary | ICD-10-CM | POA: Insufficient documentation

## 2012-08-04 DIAGNOSIS — Z91013 Allergy to seafood: Secondary | ICD-10-CM | POA: Insufficient documentation

## 2012-08-04 DIAGNOSIS — D649 Anemia, unspecified: Secondary | ICD-10-CM | POA: Insufficient documentation

## 2012-08-04 DIAGNOSIS — D6800 Von Willebrand disease, unspecified: Secondary | ICD-10-CM | POA: Insufficient documentation

## 2012-08-04 DIAGNOSIS — R1011 Right upper quadrant pain: Secondary | ICD-10-CM | POA: Insufficient documentation

## 2012-08-04 DIAGNOSIS — I498 Other specified cardiac arrhythmias: Secondary | ICD-10-CM | POA: Insufficient documentation

## 2012-08-04 DIAGNOSIS — R109 Unspecified abdominal pain: Secondary | ICD-10-CM

## 2012-08-04 DIAGNOSIS — Z91018 Allergy to other foods: Secondary | ICD-10-CM | POA: Insufficient documentation

## 2012-08-04 LAB — CBC WITH DIFFERENTIAL/PLATELET
Basophils Absolute: 0 10*3/uL (ref 0.0–0.1)
Basophils Relative: 0 % (ref 0–1)
Eosinophils Absolute: 0.1 10*3/uL (ref 0.0–0.7)
Eosinophils Relative: 1 % (ref 0–5)
HCT: 34 % — ABNORMAL LOW (ref 36.0–46.0)
Hemoglobin: 11.8 g/dL — ABNORMAL LOW (ref 12.0–15.0)
MCH: 31.9 pg (ref 26.0–34.0)
MCHC: 34.7 g/dL (ref 30.0–36.0)
MCV: 91.9 fL (ref 78.0–100.0)
Monocytes Absolute: 0.3 10*3/uL (ref 0.1–1.0)
Monocytes Relative: 4 % (ref 3–12)
Neutrophils Relative %: 87 % — ABNORMAL HIGH (ref 43–77)
RDW: 11.2 % — ABNORMAL LOW (ref 11.5–15.5)

## 2012-08-04 LAB — URINALYSIS, ROUTINE W REFLEX MICROSCOPIC
Bilirubin Urine: NEGATIVE
Ketones, ur: NEGATIVE mg/dL
Leukocytes, UA: NEGATIVE
Nitrite: NEGATIVE
Urobilinogen, UA: 0.2 mg/dL (ref 0.0–1.0)
pH: 6 (ref 5.0–8.0)

## 2012-08-04 LAB — COMPREHENSIVE METABOLIC PANEL
Albumin: 4.4 g/dL (ref 3.5–5.2)
BUN: 3 mg/dL — ABNORMAL LOW (ref 6–23)
Calcium: 9.5 mg/dL (ref 8.4–10.5)
Chloride: 96 mEq/L (ref 96–112)
Creatinine, Ser: 0.8 mg/dL (ref 0.50–1.10)
Total Bilirubin: 0.4 mg/dL (ref 0.3–1.2)
Total Protein: 7.7 g/dL (ref 6.0–8.3)

## 2012-08-04 LAB — LIPASE, BLOOD: Lipase: 29 U/L (ref 11–59)

## 2012-08-04 MED ORDER — MORPHINE SULFATE 4 MG/ML IJ SOLN
8.0000 mg | Freq: Once | INTRAMUSCULAR | Status: AC
  Administered 2012-08-04: 8 mg via INTRAVENOUS
  Filled 2012-08-04: qty 2

## 2012-08-04 MED ORDER — ONDANSETRON HCL 4 MG/2ML IJ SOLN: 4.0000 mg | Freq: Once | INTRAMUSCULAR | Status: DC

## 2012-08-04 MED ORDER — MORPHINE SULFATE 4 MG/ML IJ SOLN
6.0000 mg | Freq: Once | INTRAMUSCULAR | Status: AC
  Administered 2012-08-04: 6 mg via INTRAVENOUS
  Filled 2012-08-04: qty 2

## 2012-08-04 MED ORDER — HYDROMORPHONE HCL PF 1 MG/ML IJ SOLN
1.0000 mg | Freq: Once | INTRAMUSCULAR | Status: AC
  Administered 2012-08-04: 1 mg via INTRAVENOUS
  Filled 2012-08-04: qty 1

## 2012-08-04 MED ORDER — SODIUM CHLORIDE 0.9 % IV SOLN
Freq: Once | INTRAVENOUS | Status: AC
  Administered 2012-08-04: 20:00:00 via INTRAVENOUS

## 2012-08-04 MED ORDER — ONDANSETRON HCL 4 MG/2ML IJ SOLN
4.0000 mg | Freq: Once | INTRAMUSCULAR | Status: AC
  Administered 2012-08-04: 4 mg via INTRAVENOUS
  Filled 2012-08-04: qty 2

## 2012-08-04 NOTE — ED Notes (Signed)
Pt c/0 RUQ abd pain radiating to back. Pt had recent sphincterotomy and states pain is same as before surgery. Pt has spoken with her GI MD who told her it is possible to have retained stone. MD suggested liver enzyme testing to be done in ED. Pt denies any urinary symptoms.

## 2012-08-04 NOTE — ED Notes (Signed)
Pt requesting additional pain medication. PA aware.  

## 2012-08-04 NOTE — ED Notes (Signed)
Pt c/o sudden onset of right flank pain which radiates to back , recent abd surgery

## 2012-08-04 NOTE — ED Provider Notes (Signed)
History     CSN: [id number]  Arrival date & time 08/04/12  1803   First MD Initiated Contact with Patient 08/04/12 1917      Chief Complaint  Patient presents with  . Abdominal Pain    (Consider location/radiation/quality/duration/timing/severity/associated sxs/prior treatment) HPI Comments: Kaitlyn Johnson is a 31 y.o. Female who presents with complaint of right upper abdominal pain. States hx of the same. Recently, about 3wks ago, had ERCP and sphincterotomy  At [facility]. States she was doing well until today. States today pain worsened. Complaining of nausea, no vomiting. Denies fever, chills, urinary symptoms, problems with bowels. States feels like "prior gallbladder attacks." Cholecystectomy in 2011. States called her GI doctor and told to come to ER   Past Medical History  Diagnosis Date  . Von Willebrand disease     bruising easy  . Anemia 11/26/2011  . POTS (postural orthostatic tachycardia syndrome) 11/26/2011    Past Surgical History  Procedure Date  . Abdominal hysterectomy   . Laparoscopic endometriosis fulguration   . Roux-en-y procedure   . Cholecystectomy     Family History  Problem Relation Age of Onset  . Hypertension Mother     History  Substance Use Topics  . Smoking status: Never Smoker   . Smokeless tobacco: Never Used  . Alcohol Use: 3.0 oz/week    5 Glasses of wine per week    OB History    Grav Para Term Preterm Abortions TAB SAB Ect Mult Living                  Review of Systems  Constitutional: Negative for fever and chills.  Respiratory: Negative.   Cardiovascular: Negative.   Gastrointestinal: Positive for nausea and abdominal pain. Negative for vomiting, diarrhea and constipation.  Musculoskeletal: Negative for back pain.  Skin: Negative.   Neurological: Negative for weakness and headaches.    Allergies  Shellfish allergy; Dilaudid; Reglan; Wheat bran; Sulfa antibiotics; and Tartrazine  Home Medications   Current Outpatient  Rx  Name Route Sig Dispense Refill  . ACETAMINOPHEN 500 MG PO TABS Oral Take 1,000 mg by mouth every 6 (six) hours as needed. For pain    . ALBUTEROL SULFATE HFA 108 (90 BASE) MCG/ACT IN AERS Inhalation Inhale 2 puffs into the lungs every 6 (six) hours as needed. For shortness of breath    . BUDESONIDE-FORMOTEROL FUMARATE 80-4.5 MCG/ACT IN AERO Inhalation Inhale 2 puffs into the lungs daily.    . CYANOCOBALAMIN 1000 MCG/ML IJ SOLN Intramuscular Inject 1,000 mcg into the muscle every 30 (thirty) days.    Marland Kitchen DILTIAZEM HCL ER 180 MG PO CP24 Oral Take 180 mg by mouth daily.    . DULOXETINE HCL 60 MG PO CPEP Oral Take 60 mg by mouth daily.    Marland Kitchen EPINEPHRINE 0.3 MG/0.3ML IJ DEVI Intramuscular Inject 0.3 mLs (0.3 mg total) into the muscle as needed. 2 Device 1  . LAMOTRIGINE 100 MG PO TABS Oral Take 100 mg by mouth 2 (two) times daily.     Marland Kitchen LEVALBUTEROL TARTRATE 45 MCG/ACT IN AERO Inhalation Inhale 1-2 puffs into the lungs every 4 (four) hours as needed. For shortness of breath    . METOPROLOL TARTRATE 50 MG PO TABS Oral Take 75 mg by mouth 2 (two) times daily.    Marland Kitchen MONTELUKAST SODIUM 10 MG PO TABS Oral Take 10 mg by mouth at bedtime.    Marland Kitchen ZOLPIDEM TARTRATE 10 MG PO TABS Oral Take 10 mg by mouth at  bedtime as needed. For sleep      BP 110/69  Pulse 90  Temp 98.3 F (36.8 C) (Oral)  Resp 18  Ht 5\' 9"  (1.753 m)  Wt 151 lb (68.493 kg)  BMI 22.30 kg/m2  SpO2 100%  Physical Exam  Nursing note and vitals reviewed. Constitutional: She is oriented to person, place, and time. She appears well-developed and well-nourished. No distress.  HENT:  Head: Normocephalic.  Eyes: Conjunctivae normal are normal.  Neck: Neck supple.  Cardiovascular: Normal rate, regular rhythm and normal heart sounds.   Pulmonary/Chest: Effort normal and breath sounds normal. No respiratory distress. She has no wheezes. She has no rales.  Abdominal: Soft. Bowel sounds are normal. She exhibits no distension. There is no  guarding.       Right CVA tenderness, RUQ tenderness  Musculoskeletal: Normal range of motion. She exhibits no edema.  Neurological: She is alert and oriented to person, place, and time.  Skin: Skin is warm and dry.  Psychiatric: She has a normal mood and affect.    ED Course  Procedures (including critical care time)  Pt wih recurrent right upper abdominal pain, sphincterotomy (sphincter of odi)  3 wks ago at [facility]. Will get labs, pain meds ordered.  Results for orders placed during the hospital encounter of 08/04/12  CBC WITH DIFFERENTIAL      Component Value Range   WBC 8.9  4.0 - 10.5 K/uL   RBC 3.70 (*) 3.87 - 5.11 MIL/uL   Hemoglobin 11.8 (*) 12.0 - 15.0 g/dL   HCT 45.4 (*) 09.8 - 11.9 %   MCV 91.9  78.0 - 100.0 fL   MCH 31.9  26.0 - 34.0 pg   MCHC 34.7  30.0 - 36.0 g/dL   RDW 14.7 (*) 82.9 - 56.2 %   Platelets 234  150 - 400 K/uL   Neutrophils Relative 87 (*) 43 - 77 %   Neutro Abs 7.7  1.7 - 7.7 K/uL   Lymphocytes Relative 9 (*) 12 - 46 %   Lymphs Abs 0.8  0.7 - 4.0 K/uL   Monocytes Relative 4  3 - 12 %   Monocytes Absolute 0.3  0.1 - 1.0 K/uL   Eosinophils Relative 1  0 - 5 %   Eosinophils Absolute 0.1  0.0 - 0.7 K/uL   Basophils Relative 0  0 - 1 %   Basophils Absolute 0.0  0.0 - 0.1 K/uL  COMPREHENSIVE METABOLIC PANEL      Component Value Range   Sodium 135  135 - 145 mEq/L   Potassium 3.4 (*) 3.5 - 5.1 mEq/L   Chloride 96  96 - 112 mEq/L   CO2 26  19 - 32 mEq/L   Glucose, Bld 102 (*) 70 - 99 mg/dL   BUN 3 (*) 6 - 23 mg/dL   Creatinine, Ser 1.30  0.50 - 1.10 mg/dL   Calcium 9.5  8.4 - 86.5 mg/dL   Total Protein 7.7  6.0 - 8.3 g/dL   Albumin 4.4  3.5 - 5.2 g/dL   AST 22  0 - 37 U/L   ALT 17  0 - 35 U/L   Alkaline Phosphatase 96  39 - 117 U/L   Total Bilirubin 0.4  0.3 - 1.2 mg/dL   GFR calc non Af Amer >90  >90 mL/min   GFR calc Af Amer >90  >90 mL/min  LIPASE, BLOOD      Component Value Range   Lipase 29  11 -  59 U/L  URINALYSIS, ROUTINE W REFLEX  MICROSCOPIC      Component Value Range   Color, Urine YELLOW  YELLOW   APPearance CLEAR  CLEAR   Specific Gravity, Urine 1.001 (*) 1.005 - 1.030   pH 6.0  5.0 - 8.0   Glucose, UA NEGATIVE  NEGATIVE mg/dL   Hgb urine dipstick NEGATIVE  NEGATIVE   Bilirubin Urine NEGATIVE  NEGATIVE   Ketones, ur NEGATIVE  NEGATIVE mg/dL   Protein, ur NEGATIVE  NEGATIVE mg/dL   Urobilinogen, UA 0.2  0.0 - 1.0 mg/dL   Nitrite NEGATIVE  NEGATIVE   Leukocytes, UA NEGATIVE  NEGATIVE   Results as above, pt is requiring high amounts of pain medications with no improvement.  8:30 PM Spoke with Dr. Kinnie Scales, GI, who is familiar with pt. Recommended pain management. Pt has appointment at [facility] tomorrow morning. Discussed with pt, agrees with the plan.    10:16 PM Pt's pain improved. She is comfortable going home. Has meds at home for the night. Will follow up at [facility] tomorrow at 9am  1. Abdominal pain       MDM          Lottie Mussel, PA 08/05/12 0025

## 2012-08-05 ENCOUNTER — Other Ambulatory Visit: Payer: Self-pay | Admitting: Gastroenterology

## 2012-08-05 DIAGNOSIS — R109 Unspecified abdominal pain: Secondary | ICD-10-CM

## 2012-08-05 NOTE — ED Provider Notes (Signed)
Medical screening examination/treatment/procedure(s) were performed by non-physician practitioner and as supervising physician I was immediately available for consultation/collaboration.  Latoria Dry K Linker, MD 08/05/12 1514 

## 2012-08-07 ENCOUNTER — Ambulatory Visit
Admission: RE | Admit: 2012-08-07 | Discharge: 2012-08-07 | Disposition: A | Discharge status: Home / Self Care | Payer: Commercial Managed Care - PPO | Source: Ambulatory Visit | Attending: Gastroenterology | Admitting: Gastroenterology

## 2012-08-07 DIAGNOSIS — R109 Unspecified abdominal pain: Secondary | ICD-10-CM

## 2012-08-07 MED ORDER — GADOBENATE DIMEGLUMINE 529 MG/ML IV SOLN
15.0000 mL | Freq: Once | INTRAVENOUS | Status: AC | PRN
  Administered 2012-08-07: 15 mL via INTRAVENOUS

## 2012-08-08 ENCOUNTER — Other Ambulatory Visit: Payer: Commercial Managed Care - PPO

## 2012-09-22 ENCOUNTER — Telehealth: Payer: Self-pay | Admitting: *Deleted

## 2012-09-22 NOTE — Telephone Encounter (Signed)
Patient came in and there was no appointment for lab or md called and spoke with desk nurse and she stated that there was no room for the patient today rescheduled patient for 09-23-2012 starting at 8:15am

## 2012-09-23 ENCOUNTER — Ambulatory Visit: Payer: Commercial Managed Care - PPO | Admitting: Adult Health

## 2012-09-23 ENCOUNTER — Other Ambulatory Visit: Payer: Commercial Managed Care - PPO | Admitting: Lab

## 2012-11-06 ENCOUNTER — Telehealth: Payer: Self-pay | Admitting: Emergency Medicine

## 2012-11-06 NOTE — Telephone Encounter (Signed)
Patient called and left message requesting if her labs can be scheduled sooner than 2/27, patient missed lab and provider appointment on 09/23/12 due to recent illness.  Patient also requesting labs to be collected to check her liver enzymes.  Left message with patient advising her that Dr Welton Flakes is out of the office today and that once Dr Welton Flakes advises on her questions this office will call her back.

## 2012-11-07 ENCOUNTER — Encounter: Payer: Self-pay | Admitting: *Deleted

## 2012-11-07 NOTE — Telephone Encounter (Signed)
Ok to reschedule labs cbc/cmet/iron studies  Reschedule with me in February after labs

## 2012-11-10 ENCOUNTER — Other Ambulatory Visit: Payer: Self-pay | Admitting: Emergency Medicine

## 2012-11-10 DIAGNOSIS — D649 Anemia, unspecified: Secondary | ICD-10-CM

## 2012-11-11 ENCOUNTER — Other Ambulatory Visit: Payer: Self-pay | Admitting: Emergency Medicine

## 2012-11-11 ENCOUNTER — Other Ambulatory Visit (HOSPITAL_BASED_OUTPATIENT_CLINIC_OR_DEPARTMENT_OTHER): Payer: BC Managed Care – PPO | Admitting: Lab

## 2012-11-11 DIAGNOSIS — D649 Anemia, unspecified: Secondary | ICD-10-CM

## 2012-11-11 LAB — COMPREHENSIVE METABOLIC PANEL (CC13)
BUN: 14 mg/dL (ref 7.0–26.0)
CO2: 25 mEq/L (ref 22–29)
Creatinine: 0.8 mg/dL (ref 0.6–1.1)
Glucose: 85 mg/dl (ref 70–99)
Total Bilirubin: 0.59 mg/dL (ref 0.20–1.20)

## 2012-11-11 LAB — CBC WITH DIFFERENTIAL/PLATELET
Basophils Absolute: 0 10*3/uL (ref 0.0–0.1)
Eosinophils Absolute: 0 10*3/uL (ref 0.0–0.5)
HGB: 12.6 g/dL (ref 11.6–15.9)
LYMPH%: 25.5 % (ref 14.0–49.7)
MCV: 94.5 fL (ref 79.5–101.0)
MONO%: 6.9 % (ref 0.0–14.0)
NEUT#: 4.1 10*3/uL (ref 1.5–6.5)
Platelets: 245 10*3/uL (ref 145–400)
RBC: 3.89 10*6/uL (ref 3.70–5.45)

## 2012-11-11 LAB — LIPASE: Lipase: 24 U/L (ref 0–75)

## 2012-11-11 LAB — IRON AND TIBC
Iron: 148 ug/dL — ABNORMAL HIGH (ref 42–145)
TIBC: 318 ug/dL (ref 250–470)

## 2012-11-12 ENCOUNTER — Telehealth: Payer: Self-pay | Admitting: *Deleted

## 2012-11-12 NOTE — Telephone Encounter (Signed)
Message copied by Barb Shear, Gerald Leitz on Wed Nov 12, 2012  9:43 AM ------      Message from: Kaitlyn Johnson      Created: Tue Nov 11, 2012  2:56 PM       Call patient: cbc looks good

## 2012-11-12 NOTE — Telephone Encounter (Signed)
Per MD, LMOVM-notified pt recent labs -cbc looks good.

## 2012-12-25 ENCOUNTER — Ambulatory Visit: Payer: Commercial Managed Care - PPO | Admitting: Oncology

## 2012-12-25 ENCOUNTER — Other Ambulatory Visit: Payer: Commercial Managed Care - PPO | Admitting: Lab

## 2013-03-12 ENCOUNTER — Other Ambulatory Visit: Payer: Self-pay | Admitting: Neurosurgery

## 2013-03-12 ENCOUNTER — Ambulatory Visit
Admission: RE | Admit: 2013-03-12 | Discharge: 2013-03-12 | Disposition: A | Discharge status: Home / Self Care | Payer: BC Managed Care – PPO | Source: Ambulatory Visit | Attending: Neurosurgery | Admitting: Neurosurgery

## 2013-03-12 DIAGNOSIS — M5412 Radiculopathy, cervical region: Secondary | ICD-10-CM

## 2013-03-25 ENCOUNTER — Other Ambulatory Visit: Payer: Self-pay | Admitting: Neurosurgery

## 2013-03-27 ENCOUNTER — Other Ambulatory Visit: Payer: Self-pay | Admitting: Emergency Medicine

## 2013-03-27 ENCOUNTER — Other Ambulatory Visit (HOSPITAL_BASED_OUTPATIENT_CLINIC_OR_DEPARTMENT_OTHER): Payer: BC Managed Care – PPO | Admitting: Lab

## 2013-03-27 DIAGNOSIS — D649 Anemia, unspecified: Secondary | ICD-10-CM

## 2013-03-27 LAB — IVY BLEEDING TIME: Bleeding Time: 5.5 Minutes (ref 2.0–8.0)

## 2013-03-30 ENCOUNTER — Encounter (HOSPITAL_COMMUNITY): Payer: Self-pay

## 2013-03-30 ENCOUNTER — Encounter (HOSPITAL_COMMUNITY): Payer: Self-pay | Admitting: Pharmacy Technician

## 2013-03-30 ENCOUNTER — Encounter (HOSPITAL_COMMUNITY)
Admission: RE | Admit: 2013-03-30 | Discharge: 2013-03-30 | Disposition: A | Discharge status: Home / Self Care | Payer: BC Managed Care – PPO | Source: Ambulatory Visit | Attending: Neurosurgery | Admitting: Neurosurgery

## 2013-03-30 ENCOUNTER — Ambulatory Visit (HOSPITAL_COMMUNITY)
Admission: RE | Admit: 2013-03-30 | Discharge: 2013-03-30 | Disposition: A | Discharge status: Home / Self Care | Payer: BC Managed Care – PPO | Source: Ambulatory Visit | Attending: Neurosurgery | Admitting: Neurosurgery

## 2013-03-30 HISTORY — DX: Personal history of other diseases of the digestive system: Z87.19

## 2013-03-30 HISTORY — DX: Depression, unspecified: F32.A

## 2013-03-30 HISTORY — DX: Major depressive disorder, single episode, unspecified: F32.9

## 2013-03-30 HISTORY — DX: Reserved for concepts with insufficient information to code with codable children: IMO0002

## 2013-03-30 HISTORY — DX: Anxiety disorder, unspecified: F41.9

## 2013-03-30 HISTORY — DX: Unspecified asthma, uncomplicated: J45.909

## 2013-03-30 LAB — SURGICAL PCR SCREEN
MRSA, PCR: NEGATIVE
Staphylococcus aureus: NEGATIVE

## 2013-03-30 LAB — BASIC METABOLIC PANEL
Calcium: 9.4 mg/dL (ref 8.4–10.5)
GFR calc non Af Amer: >90 mL/min (ref >90)
Potassium: 3.6 mEq/L (ref 3.5–5.1)
Sodium: 138 mEq/L (ref 135–145)

## 2013-03-30 LAB — CBC
Hemoglobin: 12.6 g/dL (ref 12.0–15.0)
MCH: 31.9 pg (ref 26.0–34.0)
MCHC: 33.2 g/dL (ref 30.0–36.0)
Platelets: 312 10*3/uL (ref 150–400)
RBC: 3.95 MIL/uL (ref 3.87–5.11)

## 2013-03-30 MED ORDER — CEFAZOLIN SODIUM-DEXTROSE 2-3 GM-% IV SOLR
2.0000 g | INTRAVENOUS | Status: AC
  Administered 2013-03-31: 2 g via INTRAVENOUS
  Filled 2013-03-30: qty 50

## 2013-03-30 NOTE — Pre-Procedure Instructions (Signed)
Kaitlyn Johnson  03/30/2013   Your procedure is scheduled on:  03/31/13  Report to [facility] at 1145 AM.  Call this number if you have problems the morning of surgery: [phone]   Remember:   Do not eat food or drink liquids after midnight.   Take these medicines the morning of surgery with A SIP OF WATER: all inhalers,diltiazem,cymbalta,metoprolol,singular   Do not wear jewelry, make-up or nail polish.  Do not wear lotions, powders, or perfumes. You may wear deodorant.  Do not shave 48 hours prior to surgery. Men may shave face and neck.  Do not bring valuables to the hospital.  [facility] is not responsible                   for any belongings or valuables.  Contacts, dentures or bridgework may not be worn into surgery.  Leave suitcase in the car. After surgery it may be brought to your room.  For patients admitted to the hospital, checkout time is 11:00 AM the day of  discharge.   Patients discharged the day of surgery will not be allowed to drive  home.  Name and phone number of your driver: family  Special Instructions: Shower using CHG 2 nights before surgery and the night before surgery.  If you shower the day of surgery use CHG.  Use special wash - you have one bottle of CHG for all showers.  You should use approximately 1/3 of the bottle for each shower.   Please read over the following fact sheets that you were given: Pain Booklet, Coughing and Deep Breathing, MRSA Information and Surgical Site Infection Prevention

## 2013-03-30 NOTE — Progress Notes (Signed)
Kaitlyn Johnson  Cardiologist   [facility]

## 2013-03-31 ENCOUNTER — Encounter (HOSPITAL_COMMUNITY): Payer: Self-pay | Admitting: *Deleted

## 2013-03-31 ENCOUNTER — Ambulatory Visit (HOSPITAL_COMMUNITY)
Admission: RE | Admit: 2013-03-31 | Discharge: 2013-04-01 | Disposition: A | Discharge status: Home / Self Care | Payer: BC Managed Care – PPO | Source: Ambulatory Visit | Attending: Neurosurgery | Admitting: Neurosurgery

## 2013-03-31 ENCOUNTER — Encounter (HOSPITAL_COMMUNITY)
Admission: RE | Disposition: A | Discharge status: Home / Self Care | Payer: Self-pay | Source: Ambulatory Visit | Attending: Neurosurgery

## 2013-03-31 ENCOUNTER — Ambulatory Visit (HOSPITAL_COMMUNITY): Payer: BC Managed Care – PPO

## 2013-03-31 ENCOUNTER — Ambulatory Visit (HOSPITAL_COMMUNITY): Payer: BC Managed Care – PPO | Admitting: Anesthesiology

## 2013-03-31 ENCOUNTER — Encounter (HOSPITAL_COMMUNITY): Payer: Self-pay | Admitting: Anesthesiology

## 2013-03-31 DIAGNOSIS — D6800 Von Willebrand disease, unspecified: Secondary | ICD-10-CM | POA: Insufficient documentation

## 2013-03-31 DIAGNOSIS — M5020 Other cervical disc displacement, unspecified cervical region: Secondary | ICD-10-CM | POA: Insufficient documentation

## 2013-03-31 DIAGNOSIS — Z01810 Encounter for preprocedural cardiovascular examination: Secondary | ICD-10-CM | POA: Insufficient documentation

## 2013-03-31 DIAGNOSIS — Z01812 Encounter for preprocedural laboratory examination: Secondary | ICD-10-CM | POA: Insufficient documentation

## 2013-03-31 HISTORY — PX: ANTERIOR CERVICAL DECOMP/DISCECTOMY FUSION: SHX1161

## 2013-03-31 SURGERY — ANTERIOR CERVICAL DECOMPRESSION/DISCECTOMY FUSION 1 LEVEL
Anesthesia: General | Site: Neck | Wound class: Clean

## 2013-03-31 MED ORDER — ONDANSETRON HCL 4 MG/2ML IJ SOLN: 4.0000 mg | INTRAMUSCULAR | Status: DC | PRN

## 2013-03-31 MED ORDER — SODIUM CHLORIDE 0.9 % IV SOLN: 20.0000 ug | Freq: Once | INTRAVENOUS | Status: DC

## 2013-03-31 MED ORDER — ACETAMINOPHEN 650 MG RE SUPP: 650.0000 mg | RECTAL | Status: DC | PRN

## 2013-03-31 MED ORDER — NEOSTIGMINE METHYLSULFATE 1 MG/ML IJ SOLN
INTRAMUSCULAR | Status: DC | PRN
  Administered 2013-03-31: 4 mg via INTRAVENOUS

## 2013-03-31 MED ORDER — ALBUTEROL SULFATE HFA 108 (90 BASE) MCG/ACT IN AERS: 2.0000 | INHALATION_SPRAY | Freq: Four times a day (QID) | RESPIRATORY_TRACT | Status: DC | PRN

## 2013-03-31 MED ORDER — HYDROMORPHONE HCL 2 MG PO TABS: 2.0000 mg | ORAL_TABLET | ORAL | Status: DC | PRN

## 2013-03-31 MED ORDER — LACTATED RINGERS IV SOLN
INTRAVENOUS | Status: DC | PRN
  Administered 2013-03-31: 15:00:00 via INTRAVENOUS

## 2013-03-31 MED ORDER — HYDROMORPHONE HCL PF 1 MG/ML IJ SOLN
INTRAMUSCULAR | Status: AC
  Filled 2013-03-31: qty 1

## 2013-03-31 MED ORDER — SODIUM CHLORIDE 0.9 % IR SOLN
Status: DC | PRN
  Administered 2013-03-31: 15:00:00

## 2013-03-31 MED ORDER — LIDOCAINE HCL (CARDIAC) 20 MG/ML IV SOLN
INTRAVENOUS | Status: DC | PRN
  Administered 2013-03-31: 80 mg via INTRAVENOUS

## 2013-03-31 MED ORDER — BACITRACIN 50000 UNITS IM SOLR
INTRAMUSCULAR | Status: AC
  Filled 2013-03-31: qty 1

## 2013-03-31 MED ORDER — DILTIAZEM HCL ER 180 MG PO CP24
180.0000 mg | ORAL_CAPSULE | Freq: Every day | ORAL | Status: DC
  Filled 2013-03-31: qty 1

## 2013-03-31 MED ORDER — MONTELUKAST SODIUM 10 MG PO TABS
10.0000 mg | ORAL_TABLET | Freq: Every day | ORAL | Status: DC
  Filled 2013-03-31: qty 1

## 2013-03-31 MED ORDER — DEXAMETHASONE 4 MG PO TABS: 4.0000 mg | ORAL_TABLET | Freq: Four times a day (QID) | ORAL | Status: AC

## 2013-03-31 MED ORDER — OXYCODONE HCL 5 MG PO TABS
ORAL_TABLET | ORAL | Status: AC
  Filled 2013-03-31: qty 1

## 2013-03-31 MED ORDER — OXYCODONE HCL 5 MG/5ML PO SOLN: 5.0000 mg | Freq: Once | ORAL | Status: AC | PRN

## 2013-03-31 MED ORDER — BUDESONIDE-FORMOTEROL FUMARATE 80-4.5 MCG/ACT IN AERO
2.0000 | INHALATION_SPRAY | Freq: Every day | RESPIRATORY_TRACT | Status: DC
  Administered 2013-03-31: 2 via RESPIRATORY_TRACT
  Filled 2013-03-31: qty 6.9

## 2013-03-31 MED ORDER — ONDANSETRON HCL 4 MG/2ML IJ SOLN
INTRAMUSCULAR | Status: DC | PRN
  Administered 2013-03-31: 4 mg via INTRAVENOUS

## 2013-03-31 MED ORDER — FENTANYL CITRATE 0.05 MG/ML IJ SOLN
INTRAMUSCULAR | Status: DC | PRN
  Administered 2013-03-31 (×2): 100 ug via INTRAVENOUS

## 2013-03-31 MED ORDER — LAMOTRIGINE 100 MG PO TABS
100.0000 mg | ORAL_TABLET | Freq: Two times a day (BID) | ORAL | Status: DC
  Administered 2013-03-31: 100 mg via ORAL
  Filled 2013-03-31 (×4): qty 1

## 2013-03-31 MED ORDER — MIDAZOLAM HCL 5 MG/5ML IJ SOLN
INTRAMUSCULAR | Status: DC | PRN
  Administered 2013-03-31: 2 mg via INTRAVENOUS

## 2013-03-31 MED ORDER — LACTATED RINGERS IV SOLN: INTRAVENOUS | Status: DC | PRN

## 2013-03-31 MED ORDER — METOPROLOL TARTRATE 25 MG PO TABS
75.0000 mg | ORAL_TABLET | Freq: Two times a day (BID) | ORAL | Status: DC
  Administered 2013-03-31: 75 mg via ORAL
  Filled 2013-03-31 (×4): qty 1

## 2013-03-31 MED ORDER — CYCLOBENZAPRINE HCL 10 MG PO TABS
10.0000 mg | ORAL_TABLET | Freq: Three times a day (TID) | ORAL | Status: DC | PRN
  Administered 2013-03-31 (×2): 10 mg via ORAL
  Filled 2013-03-31: qty 1

## 2013-03-31 MED ORDER — HEMOSTATIC AGENTS (NO CHARGE) OPTIME
TOPICAL | Status: DC | PRN
  Administered 2013-03-31: 1 via TOPICAL

## 2013-03-31 MED ORDER — SODIUM CHLORIDE 0.9 % IJ SOLN: 3.0000 mL | Freq: Two times a day (BID) | INTRAMUSCULAR | Status: DC

## 2013-03-31 MED ORDER — CEFAZOLIN SODIUM 1-5 GM-% IV SOLN
1.0000 g | Freq: Three times a day (TID) | INTRAVENOUS | Status: AC
Start: 2013-03-31 — End: 2013-04-01
  Administered 2013-03-31 – 2013-04-01 (×2): 1 g via INTRAVENOUS
  Filled 2013-03-31 (×2): qty 50

## 2013-03-31 MED ORDER — DULOXETINE HCL 60 MG PO CPEP
60.0000 mg | ORAL_CAPSULE | Freq: Every day | ORAL | Status: DC
  Filled 2013-03-31 (×3): qty 1

## 2013-03-31 MED ORDER — SODIUM CHLORIDE 0.9 % IV SOLN
INTRAVENOUS | Status: DC
  Administered 2013-03-31 (×2): via INTRAVENOUS

## 2013-03-31 MED ORDER — SODIUM CHLORIDE 0.9 % IV SOLN: 250.0000 mL | INTRAVENOUS | Status: DC

## 2013-03-31 MED ORDER — THROMBIN 5000 UNITS EX SOLR
CUTANEOUS | Status: DC | PRN
  Administered 2013-03-31 (×2): 5000 [IU] via TOPICAL

## 2013-03-31 MED ORDER — HYDROMORPHONE HCL PF 1 MG/ML IJ SOLN
0.2500 mg | INTRAMUSCULAR | Status: DC | PRN
  Administered 2013-03-31 (×3): 0.5 mg via INTRAVENOUS

## 2013-03-31 MED ORDER — ONDANSETRON HCL 4 MG/2ML IJ SOLN: 4.0000 mg | Freq: Four times a day (QID) | INTRAMUSCULAR | Status: DC | PRN

## 2013-03-31 MED ORDER — QUETIAPINE FUMARATE ER 50 MG PO TB24
150.0000 mg | ORAL_TABLET | Freq: Every day | ORAL | Status: DC
  Administered 2013-03-31: 150 mg via ORAL
  Filled 2013-03-31 (×2): qty 3

## 2013-03-31 MED ORDER — ARTIFICIAL TEARS OP OINT
TOPICAL_OINTMENT | OPHTHALMIC | Status: DC | PRN
  Administered 2013-03-31: 1 via OPHTHALMIC

## 2013-03-31 MED ORDER — HYDROMORPHONE HCL PF 1 MG/ML IJ SOLN
1.0000 mg | INTRAMUSCULAR | Status: DC | PRN
  Administered 2013-03-31 – 2013-04-01 (×3): 1.5 mg via INTRAMUSCULAR
  Filled 2013-03-31 (×3): qty 2

## 2013-03-31 MED ORDER — MENTHOL 3 MG MT LOZG: 1.0000 | LOZENGE | OROMUCOSAL | Status: DC | PRN

## 2013-03-31 MED ORDER — LIDOCAINE HCL 4 % MT SOLN
OROMUCOSAL | Status: DC | PRN
  Administered 2013-03-31: 4 mL via TOPICAL

## 2013-03-31 MED ORDER — CYCLOBENZAPRINE HCL 10 MG PO TABS
ORAL_TABLET | ORAL | Status: AC
  Filled 2013-03-31: qty 1

## 2013-03-31 MED ORDER — SODIUM CHLORIDE 0.9 % IJ SOLN: 3.0000 mL | INTRAMUSCULAR | Status: DC | PRN

## 2013-03-31 MED ORDER — ACETAMINOPHEN 325 MG PO TABS: 650.0000 mg | ORAL_TABLET | ORAL | Status: DC | PRN

## 2013-03-31 MED ORDER — ROCURONIUM BROMIDE 100 MG/10ML IV SOLN
INTRAVENOUS | Status: DC | PRN
  Administered 2013-03-31: 50 mg via INTRAVENOUS

## 2013-03-31 MED ORDER — 0.9 % SODIUM CHLORIDE (POUR BTL) OPTIME
TOPICAL | Status: DC | PRN
  Administered 2013-03-31: 1000 mL

## 2013-03-31 MED ORDER — OXYCODONE HCL 5 MG PO TABS
5.0000 mg | ORAL_TABLET | Freq: Once | ORAL | Status: AC | PRN
  Administered 2013-03-31: 5 mg via ORAL

## 2013-03-31 MED ORDER — STERILE WATER FOR IRRIGATION IR SOLN
Status: DC | PRN
  Administered 2013-03-31: 1000 mL

## 2013-03-31 MED ORDER — DEXAMETHASONE SODIUM PHOSPHATE 4 MG/ML IJ SOLN
4.0000 mg | Freq: Four times a day (QID) | INTRAMUSCULAR | Status: AC
  Administered 2013-03-31 (×2): 4 mg via INTRAVENOUS
  Filled 2013-03-31 (×2): qty 1

## 2013-03-31 MED ORDER — SODIUM CHLORIDE 0.9 % IV SOLN
INTRAVENOUS | Status: AC
  Filled 2013-03-31: qty 500

## 2013-03-31 MED ORDER — KCL IN DEXTROSE-NACL 20-5-0.45 MEQ/L-%-% IV SOLN
80.0000 mL/h | INTRAVENOUS | Status: DC
  Administered 2013-03-31: 80 mL/h via INTRAVENOUS
  Filled 2013-03-31 (×3): qty 1000

## 2013-03-31 MED ORDER — PHENOL 1.4 % MT LIQD: 1.0000 | OROMUCOSAL | Status: DC | PRN

## 2013-03-31 MED ORDER — SODIUM CHLORIDE 0.9 % IV SOLN
15.0000 ug | INTRAVENOUS | Status: AC
  Administered 2013-03-31: 15.2 ug via INTRAVENOUS
  Filled 2013-03-31: qty 3.8

## 2013-03-31 MED ORDER — GLYCOPYRROLATE 0.2 MG/ML IJ SOLN
INTRAMUSCULAR | Status: DC | PRN
  Administered 2013-03-31: 0.6 mg via INTRAVENOUS

## 2013-03-31 MED ORDER — DEXAMETHASONE SODIUM PHOSPHATE 10 MG/ML IJ SOLN
10.0000 mg | INTRAMUSCULAR | Status: AC
  Administered 2013-03-31: 10 mg via INTRAVENOUS
  Filled 2013-03-31: qty 1

## 2013-03-31 MED ORDER — DOCUSATE SODIUM 100 MG PO CAPS
100.0000 mg | ORAL_CAPSULE | Freq: Two times a day (BID) | ORAL | Status: DC
  Administered 2013-03-31: 100 mg via ORAL
  Filled 2013-03-31: qty 1

## 2013-03-31 MED ORDER — LEVALBUTEROL TARTRATE 45 MCG/ACT IN AERO: 1.0000 | INHALATION_SPRAY | RESPIRATORY_TRACT | Status: DC | PRN

## 2013-03-31 MED ORDER — PROPOFOL 10 MG/ML IV BOLUS
INTRAVENOUS | Status: DC | PRN
  Administered 2013-03-31: 200 mg via INTRAVENOUS

## 2013-03-31 SURGICAL SUPPLY — 60 items
ADH SKN CLS APL DERMABOND .7 (GAUZE/BANDAGES/DRESSINGS) ×1
APL SKNCLS STERI-STRIP NONHPOA (GAUZE/BANDAGES/DRESSINGS) ×3
BAG DECANTER FOR FLEXI CONT (MISCELLANEOUS) ×2 IMPLANT
BENZOIN TINCTURE PRP APPL 2/3 (GAUZE/BANDAGES/DRESSINGS) ×6 IMPLANT
BIT DRILL INVIZIA (BIT) IMPLANT
BRUSH SCRUB EZ PLAIN DRY (MISCELLANEOUS) ×2 IMPLANT
BUR MATCHSTICK NEURO 3.0 LAGG (BURR) ×2 IMPLANT
CANISTER SUCTION 2500CC (MISCELLANEOUS) ×2 IMPLANT
CLOTH BEACON ORANGE TIMEOUT ST (SAFETY) ×2 IMPLANT
CONT SPEC 4OZ CLIKSEAL STRL BL (MISCELLANEOUS) ×2 IMPLANT
DERMABOND ADVANCED (GAUZE/BANDAGES/DRESSINGS) ×1
DERMABOND ADVANCED .7 DNX12 (GAUZE/BANDAGES/DRESSINGS) IMPLANT
DRAPE C-ARM 42X72 X-RAY (DRAPES) ×4 IMPLANT
DRAPE LAPAROTOMY 100X72 PEDS (DRAPES) ×2 IMPLANT
DRAPE MICROSCOPE ZEISS OPMI (DRAPES) ×2 IMPLANT
DRAPE POUCH INSTRU U-SHP 10X18 (DRAPES) ×2 IMPLANT
DRAPE SURG 17X23 STRL (DRAPES) ×4 IMPLANT
DRESSING TELFA 8X3 (GAUZE/BANDAGES/DRESSINGS) ×3 IMPLANT
DRILL BIT INVIZIA (BIT) ×2
DURAPREP 6ML APPLICATOR 50/CS (WOUND CARE) ×2 IMPLANT
ELECT COATED BLADE 2.86 ST (ELECTRODE) ×2 IMPLANT
ELECT REM PT RETURN 9FT ADLT (ELECTROSURGICAL) ×2
ELECTRODE REM PT RTRN 9FT ADLT (ELECTROSURGICAL) ×1 IMPLANT
GAUZE SPONGE 4X4 16PLY XRAY LF (GAUZE/BANDAGES/DRESSINGS) ×1 IMPLANT
GLOVE ECLIPSE 7.5 STRL STRAW (GLOVE) ×2 IMPLANT
GLOVE ECLIPSE 8.5 STRL (GLOVE) ×1 IMPLANT
GLOVE EXAM NITRILE LRG STRL (GLOVE) ×1 IMPLANT
GLOVE EXAM NITRILE XL STR (GLOVE) IMPLANT
GLOVE EXAM NITRILE XS STR PU (GLOVE) IMPLANT
GLOVE SS BIOGEL STRL SZ 6.5 (GLOVE) IMPLANT
GLOVE SUPERSENSE BIOGEL SZ 6.5 (GLOVE) ×3
GOWN BRE IMP SLV AUR LG STRL (GOWN DISPOSABLE) ×1 IMPLANT
GOWN BRE IMP SLV AUR XL STRL (GOWN DISPOSABLE) ×2 IMPLANT
GOWN STRL REIN 2XL LVL4 (GOWN DISPOSABLE) ×2 IMPLANT
HEAD HALTER (SOFTGOODS) ×2 IMPLANT
KIT BASIN OR (CUSTOM PROCEDURE TRAY) ×2 IMPLANT
KIT ROOM TURNOVER OR (KITS) ×2 IMPLANT
NDL SPNL 20GX3.5 QUINCKE YW (NEEDLE) ×1 IMPLANT
NEEDLE SPNL 20GX3.5 QUINCKE YW (NEEDLE) ×2 IMPLANT
NS IRRIG 1000ML POUR BTL (IV SOLUTION) ×2 IMPLANT
PACK LAMINECTOMY NEURO (CUSTOM PROCEDURE TRAY) ×2 IMPLANT
PAD ARMBOARD 7.5X6 YLW CONV (MISCELLANEOUS) ×2 IMPLANT
PATTIES SURGICAL .25X.25 (GAUZE/BANDAGES/DRESSINGS) IMPLANT
PATTIES SURGICAL .75X.75 (GAUZE/BANDAGES/DRESSINGS) ×2 IMPLANT
PLATE INVIZIA 1 LEV 22MM (Plate) ×1 IMPLANT
PUTTY BONE GRAFT KIT 2.5ML (Bone Implant) ×1 IMPLANT
RUBBERBAND STERILE (MISCELLANEOUS) ×4 IMPLANT
SCREW SD FIXED 12MM (Screw) ×4 IMPLANT
SPACER TMS 11X14X6MM (Spacer) ×1 IMPLANT
SPONGE GAUZE 4X4 12PLY (GAUZE/BANDAGES/DRESSINGS) ×2 IMPLANT
SPONGE INTESTINAL PEANUT (DISPOSABLE) ×2 IMPLANT
SPONGE SURGIFOAM ABS GEL SZ50 (HEMOSTASIS) ×2 IMPLANT
STRIP CLOSURE SKIN 1/2X4 (GAUZE/BANDAGES/DRESSINGS) ×2 IMPLANT
SUT PDS AB 5-0 P3 18 (SUTURE) ×2 IMPLANT
SUT VIC AB 3-0 CP2 18 (SUTURE) ×2 IMPLANT
SYR 20ML ECCENTRIC (SYRINGE) ×1 IMPLANT
TOWEL OR 17X24 6PK STRL BLUE (TOWEL DISPOSABLE) ×2 IMPLANT
TOWEL OR 17X26 10 PK STRL BLUE (TOWEL DISPOSABLE) ×2 IMPLANT
TRAP SPECIMEN MUCOUS 40CC (MISCELLANEOUS) ×1 IMPLANT
WATER STERILE IRR 1000ML POUR (IV SOLUTION) ×2 IMPLANT

## 2013-03-31 NOTE — Preoperative (Signed)
Beta Blockers   Reason not to administer Beta Blockers:Not Applicable 

## 2013-03-31 NOTE — H&P (Signed)
Kaitlyn Johnson is an 32 y.o. female.   Chief Complaint: Neck and left arm pain HPI: The patient is a 32 year old female who presented with severe neck and left upper extremity discomfort. She's tried conservative therapy without improvement and then underwent imaging study which showed a small abnormality at C5-6 on the left side. It was then elected to try one cervical epidural shot which gave her no significant improvement. After discussing the options the patient requested surgery now comes for a C5-6 anterior cervical discectomy with fusion and plating. I had a long discussion with her regarding the risks and benefits of surgical intervention. The risks discussed include but are not limited to bleeding infection weakness numbness paralysis spinal fluid leak coma quadriplegia hoarseness and death. We have discussed alternative methods of therapy offered risks and benefits of nonintervention. She's had the opportunity to ask numerous questions and appears to understand. With this information in hand she has requested we proceed with surgery.  Past Medical History  Diagnosis Date  . Von Willebrand disease     bruising easy  . Anemia 11/26/2011  . POTS (postural orthostatic tachycardia syndrome) 11/26/2011  . POTS (postural orthostatic tachycardia syndrome)   . Asthma   . Anxiety   . Depression   . H/O hiatal hernia   . Celiac and mesenteric artery injury   . Celiac and mesenteric artery injury     Past Surgical History  Procedure Laterality Date  . Abdominal hysterectomy    . Laparoscopic endometriosis fulguration    . Roux-en-y procedure    . Cholecystectomy      Family History  Problem Relation Age of Onset  . Hypertension Mother    Social History:  reports that she has never smoked. She has never used smokeless tobacco. She reports that she drinks about 3.0 ounces of alcohol per week. She reports that she does not use illicit drugs.  Allergies:  Allergies  Allergen Reactions   . Shellfish Allergy Anaphylaxis  . Reglan (Metoclopramide) Other (See Comments)    oculogyral crisis  . Wheat Bran Other (See Comments)    Pt has Celiac disease  . Sulfa Antibiotics Rash  . Tartrazine Rash    Medications Prior to Admission  Medication Sig Dispense Refill  . budesonide-formoterol (SYMBICORT) 80-4.5 MCG/ACT inhaler Inhale 2 puffs into the lungs daily.      . cyanocobalamin (,VITAMIN B-12,) 1000 MCG/ML injection Inject 1,000 mcg into the muscle every 30 (thirty) days.      . cyclobenzaprine (FLEXERIL) 10 MG tablet Take 10 mg by mouth 3 (three) times daily as needed for muscle spasms.      Marland Kitchen diltiazem (DILACOR XR) 180 MG 24 hr capsule Take 180 mg by mouth daily.      . DULoxetine (CYMBALTA) 60 MG capsule Take 60 mg by mouth daily.      Marland Kitchen HYDROmorphone (DILAUDID) 2 MG tablet Take 2 mg by mouth every 4 (four) hours as needed for pain.      Marland Kitchen lamoTRIgine (LAMICTAL) 100 MG tablet Take 100 mg by mouth 2 (two) times daily.       . metoprolol (LOPRESSOR) 50 MG tablet Take 75 mg by mouth 2 (two) times daily.      . montelukast (SINGULAIR) 10 MG tablet Take 10 mg by mouth at bedtime.      Marland Kitchen QUEtiapine Fumarate (SEROQUEL XR) 150 MG 24 hr tablet Take 150 mg by mouth at bedtime.      Marland Kitchen zolpidem (AMBIEN) 10 MG tablet  Take 10 mg by mouth at bedtime as needed for sleep. For sleep      . albuterol (PROVENTIL HFA;VENTOLIN HFA) 108 (90 BASE) MCG/ACT inhaler Inhale 2 puffs into the lungs every 6 (six) hours as needed for wheezing. For shortness of breath      . docusate sodium (COLACE) 100 MG capsule Take 100 mg by mouth 2 (two) times daily.      Marland Kitchen EPINEPHrine (EPIPEN) 0.3 mg/0.3 mL DEVI Inject 0.3 mLs (0.3 mg total) into the muscle as needed.  2 Device  1  . levalbuterol (XOPENEX HFA) 45 MCG/ACT inhaler Inhale 1-2 puffs into the lungs every 4 (four) hours as needed for shortness of breath. For shortness of breath        Results for orders placed during the hospital encounter of 03/30/13  (from the past 48 hour(s))  CBC     Status: None   Collection Time    03/30/13  9:49 AM      Result Value Range   WBC 9.0  4.0 - 10.5 K/uL   RBC 3.95  3.87 - 5.11 MIL/uL   Hemoglobin 12.6  12.0 - 15.0 g/dL   HCT 21.3  08.6 - 57.8 %   MCV 95.9  78.0 - 100.0 fL   MCH 31.9  26.0 - 34.0 pg   MCHC 33.2  30.0 - 36.0 g/dL   RDW 46.9  62.9 - 52.8 %   Platelets 312  150 - 400 K/uL  BASIC METABOLIC PANEL     Status: None   Collection Time    03/30/13  9:49 AM      Result Value Range   Sodium 138  135 - 145 mEq/L   Potassium 3.6  3.5 - 5.1 mEq/L   Chloride 98  96 - 112 mEq/L   CO2 28  19 - 32 mEq/L   Glucose, Bld 86  70 - 99 mg/dL   BUN 12  6 - 23 mg/dL   Creatinine, Ser 4.13  0.50 - 1.10 mg/dL   Calcium 9.4  8.4 - 24.4 mg/dL   GFR calc non Af Amer >90  >90 mL/min   GFR calc Af Amer >90  >90 mL/min   Comment:            The eGFR has been calculated     using the CKD EPI equation.     This calculation has not been     validated in all clinical     situations.     eGFR's persistently     <90 mL/min signify     possible Chronic Kidney Disease.  SURGICAL PCR SCREEN     Status: None   Collection Time    03/30/13  9:50 AM      Result Value Range   MRSA, PCR NEGATIVE  NEGATIVE   Staphylococcus aureus NEGATIVE  NEGATIVE   Comment:            The Xpert SA Assay (FDA     approved for NASAL specimens     in patients over 1 years of age),     is one component of     a comprehensive surveillance     program.  Test performance has     been validated by [employer] for patients greater     than or equal to 52 year old.     It is not intended     to diagnose infection nor to  guide or monitor treatment.   Dg Chest 2 View  03/30/2013   *RADIOLOGY REPORT*  Clinical Data: Preop for anterior cervical decompression  CHEST - 2 VIEW  Comparison: None.  Findings: Cardiomediastinal silhouette is unremarkable.  Mild thoracic dextroscoliosis.  No acute infiltrate or pleural effusion. No  pulmonary edema.  No diagnostic pneumothorax.  IMPRESSION: No active disease.  Mild thoracic dextroscoliosis.   Original Report Authenticated By: Natasha Mead, M.D.    Positive for von Willebrand's disease  Blood pressure 117/80, pulse 73, temperature 97.7 F (36.5 C), temperature source Oral, resp. rate 20, SpO2 98.00%.  The patient is awake alert and oriented. His no facial asymmetry. Gait is nonantalgic. Reflexes are mildly decreased. Strength is 5 over 5 except for mild biceps weakness sensation is intact Assessment/Plan Impression is that of a herniated disc at C5-6 on the left C6 nerve root compression. The plan is for a C5-6 anterior cervical discectomy with fusion and plating.  Reinaldo Meeker, MD 03/31/2013, 1:31 PM

## 2013-03-31 NOTE — Op Note (Signed)
Preop diagnosis: Herniated disc C5-6 left Postop diagnosis: Same Procedure: C5-6 decompressive anterior cervical discectomy with trabecular metal interbody fusion and invisia anterior cervical plating Surgeon: Terry Abila Assistant: Pool  After and placed the supine position and 10 pounds halter traction the patient's back was prepped and draped in the usual sterile fashion. Localizing fluoroscopy was used prior to incision to identify the appropriate level. Transverse incision was made in the right anterior neck started the midline and headed towards the medial aspect of the sternal cremaster muscle. The platysma muscle was incised transversely and the natural fascial plane between the strap muscles medially and the sternal cleidomastoid laterally was identified and followed down to the anterior aspect the cervical spine. Longus Cole muscles were identified split the midline and stripped away bilaterally with unipolar coagulation and Kitner dissection. Self retaining retractor was placed for exposure and x-ray showed approach the appropriate level. Using a 15 blade the [place] disc at C5-6 was incised. Using pituitary rongeurs and curettes approximately 90% of the disc material was removed. High-speed drill was used to widen the interspace and bony shavings were saved for use later in the case. At this time the microscope was draped brought into the field and used for the remainder of the case. Using microdissection technique the remainder of the disc material down the posterior longitudinal ligament was removed. Ligament was incised transversely and the cut edges removed a Kerrison punch. Herniated disc the toe were identified in the foramen at C5-6 on the left and this was removed until the C6 nerve root was well decompressed well visualized. Similar decompression was then carried out on the opposite side. At this time inspection was carried out in all directions any evidence of residual compression and none could  be identified. Irrigation was carried out and any bleeding control proper coagulation and Gelfoam. Measurements were taken and a 6 mm lordotic graft was chosen. This was filled with a mixture of autologous bone morselized allograft and impacted the disc space after hemostasis was confirmed once more. An appropriately length invisia plate was then chosen and drill holes were placed followed by placing of 12 mm screws x4. Locking mechanism was rotated locked position final fossae showed good positioning of the plates screws and plugs. Irrigation was carried out and any bleeding controlled with upper coagulation. The was then closed with inverted Vicryl on the platysma muscle inverted 5-0 PDS in the subcuticular layer and Steri-Strips on the skin. Shortness and soft collar applied and the patient was extubated and taken to recovery room in stable condition.

## 2013-03-31 NOTE — Anesthesia Postprocedure Evaluation (Signed)
  Anesthesia Post-op Note  Patient: Kaitlyn Johnson  Procedure(s) Performed: Procedure(s): ANTERIOR CERVICAL DECOMPRESSION/DISCECTOMY FUSION 1 LEVEL Cervical five-six (N/A)  Patient Location: PACU  Anesthesia Type:General  Level of Consciousness: awake, oriented, sedated and patient cooperative  Airway and Oxygen Therapy: Patient Spontanous Breathing  Post-op Pain: mild  Post-op Assessment: Post-op Vital signs reviewed, Patient's Cardiovascular Status Stable, Respiratory Function Stable, Patent Airway, No signs of Nausea or vomiting and Pain level controlled  Post-op Vital Signs: stable  Complications: No apparent anesthesia complications

## 2013-03-31 NOTE — Anesthesia Preprocedure Evaluation (Addendum)
Anesthesia Evaluation  Patient identified by MRN, date of birth, ID band Patient awake    Reviewed: Allergy & Precautions, H&P , NPO status , Patient's Chart, lab work & pertinent test results  Airway Mallampati: II  Neck ROM: full    Dental   Pulmonary asthma ,          Cardiovascular + Peripheral Vascular Disease  Postural tachycardia syndrome   Neuro/Psych Anxiety Depression LUE n/t, weakness, pain    GI/Hepatic hiatal hernia,   Endo/Other    Renal/GU      Musculoskeletal   Abdominal   Peds  Hematology  (+) Blood dyscrasia, , VonWillebrand's disease   Anesthesia Other Findings   Reproductive/Obstetrics S/p hysterectomy                          Anesthesia Physical Anesthesia Plan  ASA: II  Anesthesia Plan: General   Post-op Pain Management:    Induction: Intravenous  Airway Management Planned: Oral ETT  Additional Equipment:   Intra-op Plan:   Post-operative Plan: Extubation in OR  Informed Consent: I have reviewed the patients History and Physical, chart, labs and discussed the procedure including the risks, benefits and alternatives for the proposed anesthesia with the patient or authorized representative who has indicated his/her understanding and acceptance.     Plan Discussed with: CRNA, Anesthesiologist and Surgeon  Anesthesia Plan Comments:         Anesthesia Quick Evaluation

## 2013-03-31 NOTE — Transfer of Care (Signed)
Immediate Anesthesia Transfer of Care Note  Patient: Kaitlyn Johnson  Procedure(s) Performed: Procedure(s): ANTERIOR CERVICAL DECOMPRESSION/DISCECTOMY FUSION 1 LEVEL Cervical five-six (N/A)  Patient Location: PACU  Anesthesia Type:General  Level of Consciousness: awake, alert  and oriented  Airway & Oxygen Therapy: Patient Spontanous Breathing  Post-op Assessment: Report given to PACU RN, Post -op Vital signs reviewed and stable and Patient moving all extremities X 4  Post vital signs: Reviewed and stable  Complications: No apparent anesthesia complications

## 2013-04-01 MED ORDER — HYDROMORPHONE HCL 2 MG PO TABS: 2.0000 mg | ORAL_TABLET | ORAL | Status: DC | PRN

## 2013-04-01 NOTE — Progress Notes (Signed)
Pt given D/C instructions with Rx, verbal understanding given. Pt D/C'd home via wheelchair @ [phone] per MD order. Rema Fendt, RN

## 2013-04-01 NOTE — Discharge Summary (Signed)
Physician Discharge Summary  Patient ID: Kaitlyn Johnson MRN: [id number] DOB/AGE: 1981-06-29 32 y.o.  Admit date: 03/31/2013 Discharge date: 04/01/2013  Admission Diagnoses:  Discharge Diagnoses:  Active Problems:   * No active hospital problems. *   Discharged Condition: good  Hospital Course: surgery one day for hnp C 56. Did well w marked improvement in left arm pain. Up ambulating next am, and ready for d/c. Specific instructions given.  Consults: None  Significant Diagnostic Studies: none  Treatments: surgery: C 56 acdf with plating  Discharge Exam: Blood pressure 111/73, pulse 91, temperature 98.8 F (37.1 C), temperature source Oral, resp. rate 20, SpO2 97.00%. Incision/Wound:clean and dry  Disposition: 01-Home or Self Care  Discharge Orders   Future Orders Complete By Expires     Call MD for:  difficulty breathing, headache or visual disturbances  As directed     Call MD for:  hives  As directed     Call MD for:  persistant nausea and vomiting  As directed     Call MD for:  redness, tenderness, or signs of infection (pain, swelling, redness, odor or green/yellow discharge around incision site)  As directed     Call MD for:  severe uncontrolled pain  As directed     Call MD for:  temperature >100.4  As directed     Diet general  As directed     Discharge instructions  As directed     Comments:      Mostly bedrest. Get up 9 or 10 times each day and walk for 15-20 minutes each time. Very little sitting the first week. No riding in the car until your first post op appointment. If you had neck surgery...may shower from the chest down. If you had low back surgery....you may shower with a saran wrap covering over the incision. Take your pain medicine as needed...and other medicines that you are instructed to take. Call for an appointment...[phone].        Medication List    TAKE these medications       albuterol 108 (90 BASE) MCG/ACT inhaler  Commonly known as:   PROVENTIL HFA;VENTOLIN HFA  Inhale 2 puffs into the lungs every 6 (six) hours as needed for wheezing. For shortness of breath     budesonide-formoterol 80-4.5 MCG/ACT inhaler  Commonly known as:  SYMBICORT  Inhale 2 puffs into the lungs daily.     COLACE 100 MG capsule  Generic drug:  docusate sodium  Take 100 mg by mouth 2 (two) times daily.     cyanocobalamin 1000 MCG/ML injection  Commonly known as:  (VITAMIN B-12)  Inject 1,000 mcg into the muscle every 30 (thirty) days.     cyclobenzaprine 10 MG tablet  Commonly known as:  FLEXERIL  Take 10 mg by mouth 3 (three) times daily as needed for muscle spasms.     diltiazem 180 MG 24 hr capsule  Commonly known as:  DILACOR XR  Take 180 mg by mouth daily.     DULoxetine 60 MG capsule  Commonly known as:  CYMBALTA  Take 60 mg by mouth daily.     EPINEPHrine 0.3 mg/0.3 mL Devi  Commonly known as:  EPIPEN  Inject 0.3 mLs (0.3 mg total) into the muscle as needed.     HYDROmorphone 2 MG tablet  Commonly known as:  DILAUDID  Take 1 tablet (2 mg total) by mouth every 4 (four) hours as needed.     HYDROmorphone 2 MG tablet  Commonly known as:  DILAUDID  Take 2 mg by mouth every 4 (four) hours as needed for pain.     lamoTRIgine 100 MG tablet  Commonly known as:  LAMICTAL  Take 100 mg by mouth 2 (two) times daily.     levalbuterol 45 MCG/ACT inhaler  Commonly known as:  XOPENEX HFA  Inhale 1-2 puffs into the lungs every 4 (four) hours as needed for shortness of breath. For shortness of breath     montelukast 10 MG tablet  Commonly known as:  SINGULAIR  Take 10 mg by mouth at bedtime.     SEROQUEL XR 150 MG 24 hr tablet  Generic drug:  QUEtiapine Fumarate  Take 150 mg by mouth at bedtime.     zolpidem 10 MG tablet  Commonly known as:  AMBIEN  Take 10 mg by mouth at bedtime as needed for sleep. For sleep      ASK your doctor about these medications       metoprolol 50 MG tablet  Commonly known as:  LOPRESSOR  Take 75  mg by mouth 2 (two) times daily.         At home rest most of the time. Get up 9 or 10 times each day and take a 15 or 20 minute walk. No riding in the car and to your first postoperative appointment. If you have neck surgery you may shower from the chest down starting on the third postoperative day. If you had back surgery he may start showering on the third postoperative day with saran wrap wrapped around your incisional area 3 times. After the shower remove the saran wrap. Take pain medicine as needed and other medications as instructed. Call my office for an appointment.  SignedReinaldo Meeker, MD 04/01/2013, 8:48 AM

## 2013-04-02 ENCOUNTER — Encounter (HOSPITAL_COMMUNITY): Payer: Self-pay | Admitting: Neurosurgery

## 2013-05-26 ENCOUNTER — Other Ambulatory Visit: Payer: Self-pay | Admitting: Neurosurgery

## 2013-05-26 DIAGNOSIS — M5412 Radiculopathy, cervical region: Secondary | ICD-10-CM

## 2013-05-27 ENCOUNTER — Ambulatory Visit
Admission: RE | Admit: 2013-05-27 | Discharge: 2013-05-27 | Disposition: A | Discharge status: Home / Self Care | Payer: BC Managed Care – PPO | Source: Ambulatory Visit | Attending: Neurosurgery | Admitting: Neurosurgery

## 2013-05-27 DIAGNOSIS — M5412 Radiculopathy, cervical region: Secondary | ICD-10-CM

## 2013-08-08 ENCOUNTER — Emergency Department (HOSPITAL_BASED_OUTPATIENT_CLINIC_OR_DEPARTMENT_OTHER): Payer: BC Managed Care – PPO

## 2013-08-08 ENCOUNTER — Emergency Department (HOSPITAL_BASED_OUTPATIENT_CLINIC_OR_DEPARTMENT_OTHER)
Admission: EM | Admit: 2013-08-08 | Discharge: 2013-08-08 | Disposition: A | Discharge status: Home / Self Care | Payer: BC Managed Care – PPO | Attending: Emergency Medicine | Admitting: Emergency Medicine

## 2013-08-08 ENCOUNTER — Encounter (HOSPITAL_BASED_OUTPATIENT_CLINIC_OR_DEPARTMENT_OTHER): Payer: Self-pay | Admitting: Emergency Medicine

## 2013-08-08 DIAGNOSIS — Y939 Activity, unspecified: Secondary | ICD-10-CM | POA: Insufficient documentation

## 2013-08-08 DIAGNOSIS — S0990XA Unspecified injury of head, initial encounter: Secondary | ICD-10-CM | POA: Insufficient documentation

## 2013-08-08 DIAGNOSIS — Y929 Unspecified place or not applicable: Secondary | ICD-10-CM | POA: Insufficient documentation

## 2013-08-08 DIAGNOSIS — S139XXA Sprain of joints and ligaments of unspecified parts of neck, initial encounter: Secondary | ICD-10-CM | POA: Insufficient documentation

## 2013-08-08 DIAGNOSIS — F411 Generalized anxiety disorder: Secondary | ICD-10-CM | POA: Insufficient documentation

## 2013-08-08 DIAGNOSIS — F329 Major depressive disorder, single episode, unspecified: Secondary | ICD-10-CM | POA: Insufficient documentation

## 2013-08-08 DIAGNOSIS — Z862 Personal history of diseases of the blood and blood-forming organs and certain disorders involving the immune mechanism: Secondary | ICD-10-CM | POA: Insufficient documentation

## 2013-08-08 DIAGNOSIS — F3289 Other specified depressive episodes: Secondary | ICD-10-CM | POA: Insufficient documentation

## 2013-08-08 DIAGNOSIS — Z8679 Personal history of other diseases of the circulatory system: Secondary | ICD-10-CM | POA: Insufficient documentation

## 2013-08-08 DIAGNOSIS — W010XXA Fall on same level from slipping, tripping and stumbling without subsequent striking against object, initial encounter: Secondary | ICD-10-CM | POA: Insufficient documentation

## 2013-08-08 DIAGNOSIS — J45909 Unspecified asthma, uncomplicated: Secondary | ICD-10-CM | POA: Insufficient documentation

## 2013-08-08 DIAGNOSIS — W19XXXA Unspecified fall, initial encounter: Secondary | ICD-10-CM

## 2013-08-08 DIAGNOSIS — Z79899 Other long term (current) drug therapy: Secondary | ICD-10-CM | POA: Insufficient documentation

## 2013-08-08 DIAGNOSIS — S161XXA Strain of muscle, fascia and tendon at neck level, initial encounter: Secondary | ICD-10-CM

## 2013-08-08 DIAGNOSIS — Z8719 Personal history of other diseases of the digestive system: Secondary | ICD-10-CM | POA: Insufficient documentation

## 2013-08-08 MED ORDER — OXYCODONE HCL 5 MG PO TABS
5.0000 mg | ORAL_TABLET | Freq: Once | ORAL | Status: AC
  Administered 2013-08-08: 5 mg via ORAL
  Filled 2013-08-08: qty 1

## 2013-08-08 NOTE — ED Notes (Signed)
Patient reports that she dusted yesterday and slipped on pledge, fell backwards striking head and now posterior neck pain and had recent cervical fusion, no loc

## 2013-08-08 NOTE — ED Provider Notes (Signed)
CSN: [id number]     Arrival date & time 08/08/13  1320 History   First MD Initiated Contact with Patient 08/08/13 1338     Chief Complaint  Patient presents with  . Fall   (Consider location/radiation/quality/duration/timing/severity/associated sxs/prior Treatment) Patient is a 32 y.o. female presenting with fall.  Fall   Pt with history of von willebrand disease and ACDF reports she slipped and fell last night, landing on her back. She has had moderate to severe aching neck pain since then. Denies LOC, mild headache. Came to the ED today primarily due to continued neck pain. She denies any radicular symptoms.  Past Medical History  Diagnosis Date  . Von Willebrand disease     bruising easy  . Anemia 11/26/2011  . POTS (postural orthostatic tachycardia syndrome) 11/26/2011  . POTS (postural orthostatic tachycardia syndrome)   . Asthma   . Anxiety   . Depression   . H/O hiatal hernia   . Celiac and mesenteric artery injury   . Celiac and mesenteric artery injury    Past Surgical History  Procedure Laterality Date  . Abdominal hysterectomy    . Laparoscopic endometriosis fulguration    . Roux-en-y procedure    . Cholecystectomy    . Anterior cervical decomp/discectomy fusion N/A 03/31/2013    Procedure: ANTERIOR CERVICAL DECOMPRESSION/DISCECTOMY FUSION 1 LEVEL Cervical five-six;  Surgeon: Faythe Ghee, MD;  Location: [facility];  Service: Neurosurgery;  Laterality: N/A;   Family History  Problem Relation Age of Onset  . Hypertension Mother    History  Substance Use Topics  . Smoking status: Never Smoker   . Smokeless tobacco: Never Used  . Alcohol Use: 3.0 oz/week    5 Glasses of wine per week   OB History   Grav Para Term Preterm Abortions TAB SAB Ect Mult Living                 Review of Systems All other systems reviewed and are negative except as noted in HPI.   Allergies  Shellfish allergy; Reglan; Wheat bran; Sulfa antibiotics; and Tartrazine  Home  Medications   Current Outpatient Rx  Name  Route  Sig  Dispense  Refill  . Lurasidone HCl (LATUDA) 20 MG TABS   Oral   Take 20 mg by mouth.         Marland Kitchen albuterol (PROVENTIL HFA;VENTOLIN HFA) 108 (90 BASE) MCG/ACT inhaler   Inhalation   Inhale 2 puffs into the lungs every 6 (six) hours as needed for wheezing. For shortness of breath         . budesonide-formoterol (SYMBICORT) 80-4.5 MCG/ACT inhaler   Inhalation   Inhale 2 puffs into the lungs daily.         . cyanocobalamin (,VITAMIN B-12,) 1000 MCG/ML injection   Intramuscular   Inject 1,000 mcg into the muscle every 30 (thirty) days.         . cyclobenzaprine (FLEXERIL) 10 MG tablet   Oral   Take 10 mg by mouth 3 (three) times daily as needed for muscle spasms.         Marland Kitchen diltiazem (DILACOR XR) 180 MG 24 hr capsule   Oral   Take 180 mg by mouth daily.         Marland Kitchen docusate sodium (COLACE) 100 MG capsule   Oral   Take 100 mg by mouth 2 (two) times daily.         . DULoxetine (CYMBALTA) 60 MG capsule   Oral  Take 60 mg by mouth daily.         Marland Kitchen EPINEPHrine (EPIPEN) 0.3 mg/0.3 mL DEVI   Intramuscular   Inject 0.3 mLs (0.3 mg total) into the muscle as needed.   2 Device   1   . HYDROmorphone (DILAUDID) 2 MG tablet   Oral   Take 2 mg by mouth every 4 (four) hours as needed for pain.         Marland Kitchen HYDROmorphone (DILAUDID) 2 MG tablet   Oral   Take 1 tablet (2 mg total) by mouth every 4 (four) hours as needed.   50 tablet   0   . levalbuterol (XOPENEX HFA) 45 MCG/ACT inhaler   Inhalation   Inhale 1-2 puffs into the lungs every 4 (four) hours as needed for shortness of breath. For shortness of breath         . metoprolol (LOPRESSOR) 50 MG tablet   Oral   Take 75 mg by mouth 2 (two) times daily.         . montelukast (SINGULAIR) 10 MG tablet   Oral   Take 10 mg by mouth at bedtime.         Marland Kitchen QUEtiapine Fumarate (SEROQUEL XR) 150 MG 24 hr tablet   Oral   Take 150 mg by mouth at bedtime.          Marland Kitchen zolpidem (AMBIEN) 10 MG tablet   Oral   Take 10 mg by mouth at bedtime as needed for sleep. For sleep          BP 132/88  Pulse 95  Temp(Src) 97.9 F (36.6 C) (Oral)  Resp 16  Ht 5' 9"  (1.753 m)  Wt 178 lb (80.74 kg)  BMI 26.27 kg/m2  SpO2 100% Physical Exam  Nursing note and vitals reviewed. Constitutional: She is oriented to person, place, and time. She appears well-developed and well-nourished.  HENT:  Head: Normocephalic.  Hematoma posterior scalp  Eyes: EOM are normal. Pupils are equal, round, and reactive to light.  Neck: No tracheal deviation present.  See MSK exam  Cardiovascular: Normal rate, normal heart sounds and intact distal pulses.   Pulmonary/Chest: Effort normal and breath sounds normal.  Abdominal: Bowel sounds are normal. She exhibits no distension. There is no tenderness.  Musculoskeletal: Normal range of motion. She exhibits tenderness (midline cervical spine). She exhibits no edema.  Lymphadenopathy:    She has no cervical adenopathy.  Neurological: She is alert and oriented to person, place, and time. She has normal strength. No cranial nerve deficit or sensory deficit.  Skin: Skin is warm and dry. No rash noted.  Psychiatric: She has a normal mood and affect.    ED Course  Procedures (including critical care time) Labs Review Labs Reviewed - No data to display Imaging Review Ct Head Wo Contrast  08/08/2013   CLINICAL DATA:  Head and neck pain after fall.  EXAM: CT HEAD WITHOUT CONTRAST  CT CERVICAL SPINE WITHOUT CONTRAST  TECHNIQUE: Multidetector CT imaging of the head and cervical spine was performed following the standard protocol without intravenous contrast. Multiplanar CT image reconstructions of the cervical spine were also generated.  COMPARISON:  CT scan of head of June 25, 2009.  FINDINGS: CT HEAD FINDINGS  Bony calvarium appears intact. No mass effect or midline shift is noted. Ventricular size is within normal limits. There is no  evidence of mass lesion, hemorrhage or acute infarction.  CT CERVICAL SPINE FINDINGS  Status post surgical anterior fusion  of C5-6 with good alignment of vertebral bodies. No fracture or spondylolisthesis is noted. No other significant degenerative changes are noted.  IMPRESSION: No gross intracranial abnormality seen.  Status post surgical anterior fusion of C5-6. No acute abnormality seen in the cervical spine.   Electronically Signed   By: Sabino Dick M.D.   On: 08/08/2013 15:03   Ct Cervical Spine Wo Contrast  08/08/2013   CLINICAL DATA:  Head and neck pain after fall.  EXAM: CT HEAD WITHOUT CONTRAST  CT CERVICAL SPINE WITHOUT CONTRAST  TECHNIQUE: Multidetector CT imaging of the head and cervical spine was performed following the standard protocol without intravenous contrast. Multiplanar CT image reconstructions of the cervical spine were also generated.  COMPARISON:  CT scan of head of June 25, 2009.  FINDINGS: CT HEAD FINDINGS  Bony calvarium appears intact. No mass effect or midline shift is noted. Ventricular size is within normal limits. There is no evidence of mass lesion, hemorrhage or acute infarction.  CT CERVICAL SPINE FINDINGS  Status post surgical anterior fusion of C5-6 with good alignment of vertebral bodies. No fracture or spondylolisthesis is noted. No other significant degenerative changes are noted.  IMPRESSION: No gross intracranial abnormality seen.  Status post surgical anterior fusion of C5-6. No acute abnormality seen in the cervical spine.   Electronically Signed   By: Sabino Dick M.D.   On: 08/08/2013 15:03    EKG Interpretation   None       MDM   1. Fall, initial encounter   2. Head injury without concussion or intracranial hemorrhage, initial encounter   3. Cervical strain, acute, initial encounter     CT results reviewed, neg for acute findings. Advised pain meds at home if needed. She has soft collar she has been wearing post-op and has followup with her  Neurosurgeon already scheduled in 3 days.    Queena Monrreal B. Karle Starch, MD 08/08/13 [phone]

## 2013-08-28 ENCOUNTER — Other Ambulatory Visit: Payer: Self-pay | Admitting: Emergency Medicine

## 2013-08-28 DIAGNOSIS — D649 Anemia, unspecified: Secondary | ICD-10-CM

## 2013-08-31 ENCOUNTER — Telehealth: Payer: Self-pay | Admitting: Oncology

## 2013-08-31 NOTE — Telephone Encounter (Signed)
Gave pt appt for lab and MD on November 2014

## 2013-09-01 ENCOUNTER — Ambulatory Visit (INDEPENDENT_AMBULATORY_CARE_PROVIDER_SITE_OTHER): Payer: BC Managed Care – PPO | Admitting: Endocrinology

## 2013-09-01 ENCOUNTER — Encounter: Payer: Self-pay | Admitting: Endocrinology

## 2013-09-01 VITALS — BP 124/74 | HR 88 | Ht 69.0 in | Wt 199.0 lb

## 2013-09-01 DIAGNOSIS — M5412 Radiculopathy, cervical region: Secondary | ICD-10-CM

## 2013-09-01 DIAGNOSIS — J3089 Other allergic rhinitis: Secondary | ICD-10-CM

## 2013-09-01 DIAGNOSIS — B029 Zoster without complications: Secondary | ICD-10-CM

## 2013-09-01 DIAGNOSIS — K900 Celiac disease: Secondary | ICD-10-CM

## 2013-09-01 DIAGNOSIS — N3010 Interstitial cystitis (chronic) without hematuria: Secondary | ICD-10-CM

## 2013-09-01 DIAGNOSIS — K551 Chronic vascular disorders of intestine: Secondary | ICD-10-CM

## 2013-09-01 DIAGNOSIS — F3289 Other specified depressive episodes: Secondary | ICD-10-CM

## 2013-09-01 DIAGNOSIS — M5010 Cervical disc disorder with radiculopathy, unspecified cervical region: Secondary | ICD-10-CM

## 2013-09-01 DIAGNOSIS — R635 Abnormal weight gain: Secondary | ICD-10-CM

## 2013-09-01 DIAGNOSIS — N809 Endometriosis, unspecified: Secondary | ICD-10-CM

## 2013-09-01 DIAGNOSIS — F329 Major depressive disorder, single episode, unspecified: Secondary | ICD-10-CM

## 2013-09-01 DIAGNOSIS — J45909 Unspecified asthma, uncomplicated: Secondary | ICD-10-CM

## 2013-09-01 MED ORDER — DEXAMETHASONE 1 MG PO TABS: ORAL_TABLET | ORAL | Status: DC

## 2013-09-01 NOTE — Patient Instructions (Signed)
Let's check the 24-HR urine for cortisone. On a later day, please do a "dexamethasone suppression test."  for this, you would take dexamethasone 1 mg at 10 pm, then come in for a "cortisol" blood test the next morning before 9 am.  you do not need to be fasting for this test.

## 2013-09-01 NOTE — Progress Notes (Signed)
Subjective:    Patient ID: Kaitlyn Johnson, female    DOB: Sep 20, 1981, 32 y.o.   MRN: [id number]  HPI Pt states a few weeks of moderately increased adiposity of the cheeks, and assoc prominence of the upper mid-back.  approx 6 weeks ago, she had 5 spinal steroid injections.  She says these helped initially, but pain has recurred over the past few weeks.   She was rx'ed with lupron by dr Billy Coast for endometriosis in 2010.   Past Medical History  Diagnosis Date  . Von Willebrand disease     bruising easy  . Anemia 11/26/2011  . POTS (postural orthostatic tachycardia syndrome) 11/26/2011  . POTS (postural orthostatic tachycardia syndrome)   . Asthma   . Anxiety   . Depression   . H/O hiatal hernia   . Celiac and mesenteric artery injury   . Celiac and mesenteric artery injury     Past Surgical History  Procedure Laterality Date  . Abdominal hysterectomy    . Laparoscopic endometriosis fulguration    . Roux-en-y procedure    . Cholecystectomy    . Anterior cervical decomp/discectomy fusion N/A 03/31/2013    Procedure: ANTERIOR CERVICAL DECOMPRESSION/DISCECTOMY FUSION 1 LEVEL Cervical five-six;  Surgeon: Reinaldo Meeker, MD;  Location: [facility];  Service: Neurosurgery;  Laterality: N/A;    History   Social History  . Marital Status: Married    Spouse Name: N/A    Number of Children: N/A  . Years of Education: N/A   Occupational History  . Not on file.   Social History Main Topics  . Smoking status: Never Smoker   . Smokeless tobacco: Never Used  . Alcohol Use: 3.0 oz/week    5 Glasses of wine per week  . Drug Use: No  . Sexual Activity: Yes    Birth Control/ Protection: None, Surgical   Other Topics Concern  . Not on file   Social History Narrative  . No narrative on file    Current Outpatient Prescriptions on File Prior to Visit  Medication Sig Dispense Refill  . budesonide-formoterol (SYMBICORT) 80-4.5 MCG/ACT inhaler Inhale 2 puffs into the lungs daily.       . cyanocobalamin (,VITAMIN B-12,) 1000 MCG/ML injection Inject 1,000 mcg into the muscle every 30 (thirty) days.      . cyclobenzaprine (FLEXERIL) 10 MG tablet Take 10 mg by mouth 3 (three) times daily as needed for muscle spasms.      Marland Kitchen diltiazem (DILACOR XR) 180 MG 24 hr capsule Take 180 mg by mouth daily.      Marland Kitchen docusate sodium (COLACE) 100 MG capsule Take 100 mg by mouth 2 (two) times daily.      . DULoxetine (CYMBALTA) 60 MG capsule Take 60 mg by mouth daily.      Marland Kitchen EPINEPHrine (EPIPEN) 0.3 mg/0.3 mL DEVI Inject 0.3 mLs (0.3 mg total) into the muscle as needed.  2 Device  1  . Lurasidone HCl (LATUDA) 20 MG TABS Take 20 mg by mouth.      . metoprolol (LOPRESSOR) 50 MG tablet Take 75 mg by mouth 2 (two) times daily.      . montelukast (SINGULAIR) 10 MG tablet Take 10 mg by mouth at bedtime.      Marland Kitchen QUEtiapine Fumarate (SEROQUEL XR) 150 MG 24 hr tablet Take 150 mg by mouth at bedtime.      Marland Kitchen zolpidem (AMBIEN) 10 MG tablet Take 10 mg by mouth at bedtime as needed for sleep. For  sleep      . albuterol (PROVENTIL HFA;VENTOLIN HFA) 108 (90 BASE) MCG/ACT inhaler Inhale 2 puffs into the lungs every 6 (six) hours as needed for wheezing. For shortness of breath      . HYDROmorphone (DILAUDID) 2 MG tablet Take 2 mg by mouth every 4 (four) hours as needed for pain.      Marland Kitchen HYDROmorphone (DILAUDID) 2 MG tablet Take 1 tablet (2 mg total) by mouth every 4 (four) hours as needed.  50 tablet  0  . levalbuterol (XOPENEX HFA) 45 MCG/ACT inhaler Inhale 1-2 puffs into the lungs every 4 (four) hours as needed for shortness of breath. For shortness of breath       No current facility-administered medications on file prior to visit.   Allergies  Allergen Reactions  . Shellfish Allergy Anaphylaxis  . Reglan [Metoclopramide] Other (See Comments)    oculogyral crisis  . Wheat Bran Other (See Comments)    Pt has Celiac disease  . Sulfa Antibiotics Rash  . Tartrazine Rash   Family History  Problem Relation Age  of Onset  . Hypertension Mother   neg for cushing's dz or other pituitary dz BP 124/74  Pulse 88  Ht 5\' 9"  (1.753 m)  Wt 199 lb (90.266 kg)  BMI 29.37 kg/m2  SpO2 98%  Review of Systems denies hirsutism, hair loss, sob, hyperpigmentation, cramps, numbness, muscle weakness, and rash on the abdomen.  She has galactorrhea.  She reports arthralgias, weight gain (worst at the abdomen), excessive thirst, excessive diaphoresis, depression, easy bruising, headache, and mood swings.  She has no menses due to TAH.     Objective:   Physical Exam VS: see vs page GEN: no distress HEAD: head: no deformity eyes: no periorbital swelling, no proptosis external nose and ears are normal mouth: no lesion seen NECK: supple, thyroid is not enlarged CHEST WALL: no deformity LUNGS:  Clear to auscultation CV: reg rate and rhythm, no murmur ABD: abdomen is soft, nontender.  no hepatosplenomegaly.  not distended.  no hernia.  No striae.  MUSCULOSKELETAL: muscle bulk and strength are grossly normal.  no obvious joint swelling.  gait is normal and steady.  The upper thoracic spine is slightly prominent EXTEMITIES: no deformity.  no ulcer on the feet.  feet are of normal color and temp.  no edema PULSES: dorsalis pedis intact bilat.  no carotid bruit NEURO:  cn 2-12 grossly intact.   readily moves all 4's.  sensation is intact to touch on the feet SKIN:  Normal texture and temperature.  No rash or suspicious lesion is visible.  No ecchymoses. NODES:  None palpable at the neck PSYCH: alert, oriented x3.  Does not appear anxious nor depressed.      Assessment & Plan:  Back pain: the steroid injections can cause cushing's syndrome.  However, if the benefits outweigh this, it is fine with me to give.   Weight gain: uncertain etiology.  Pt wants to exclude endogenous cushing's syndrome. Depression and other sxs, unlikely related to hypercortisolism.

## 2013-09-02 ENCOUNTER — Encounter (HOSPITAL_COMMUNITY): Payer: Self-pay | Admitting: Emergency Medicine

## 2013-09-02 ENCOUNTER — Emergency Department (HOSPITAL_COMMUNITY)
Admission: EM | Admit: 2013-09-02 | Discharge: 2013-09-03 | Disposition: A | Discharge status: Home / Self Care | Payer: BC Managed Care – PPO | Attending: Emergency Medicine | Admitting: Emergency Medicine

## 2013-09-02 DIAGNOSIS — Z882 Allergy status to sulfonamides status: Secondary | ICD-10-CM | POA: Insufficient documentation

## 2013-09-02 DIAGNOSIS — R231 Pallor: Secondary | ICD-10-CM | POA: Insufficient documentation

## 2013-09-02 DIAGNOSIS — M25519 Pain in unspecified shoulder: Secondary | ICD-10-CM | POA: Insufficient documentation

## 2013-09-02 DIAGNOSIS — Z8679 Personal history of other diseases of the circulatory system: Secondary | ICD-10-CM | POA: Insufficient documentation

## 2013-09-02 DIAGNOSIS — F3289 Other specified depressive episodes: Secondary | ICD-10-CM | POA: Insufficient documentation

## 2013-09-02 DIAGNOSIS — F411 Generalized anxiety disorder: Secondary | ICD-10-CM | POA: Insufficient documentation

## 2013-09-02 DIAGNOSIS — Z79899 Other long term (current) drug therapy: Secondary | ICD-10-CM | POA: Insufficient documentation

## 2013-09-02 DIAGNOSIS — M5412 Radiculopathy, cervical region: Secondary | ICD-10-CM

## 2013-09-02 DIAGNOSIS — Z8719 Personal history of other diseases of the digestive system: Secondary | ICD-10-CM | POA: Insufficient documentation

## 2013-09-02 DIAGNOSIS — D649 Anemia, unspecified: Secondary | ICD-10-CM | POA: Insufficient documentation

## 2013-09-02 DIAGNOSIS — Z888 Allergy status to other drugs, medicaments and biological substances status: Secondary | ICD-10-CM | POA: Insufficient documentation

## 2013-09-02 DIAGNOSIS — J45909 Unspecified asthma, uncomplicated: Secondary | ICD-10-CM | POA: Insufficient documentation

## 2013-09-02 DIAGNOSIS — F329 Major depressive disorder, single episode, unspecified: Secondary | ICD-10-CM | POA: Insufficient documentation

## 2013-09-02 DIAGNOSIS — M7989 Other specified soft tissue disorders: Secondary | ICD-10-CM | POA: Insufficient documentation

## 2013-09-02 DIAGNOSIS — D6800 Von Willebrand disease, unspecified: Secondary | ICD-10-CM | POA: Insufficient documentation

## 2013-09-02 NOTE — ED Provider Notes (Signed)
CSN: [id number]     Arrival date & time 09/02/13  2253 History   First MD Initiated Contact with Patient 09/02/13 2257     Chief Complaint  Patient presents with  . Hand Problem   (Consider location/radiation/quality/duration/timing/severity/associated sxs/prior Treatment) HPI Comments: Pt with hx of anterior cervical fusion and discectomy in June who presents with c/o pain in the L arm - radiates down the back of the shoulder and the upper arm and radiates into the hand on the 4th and 5th fingers - this occurred immediately after helping friend move a couple of heavy furniture items.  This is persistent, and assocaited with a feeling of swellign in her L hand and fingers - incidentally she noted that she has had some discoloration of the hand and fingers which had a purple and pale hue to the fingers.  This has gradually improved as well but is still present.  She has been on high doses of steriods - has hx of Von Willegrand disease, POTS, and Abd hysterectomy 2/2 endometriosis complications.    Patient also has a history of anemia and has required iron in the past. She is unsure why she has an iron deficiency anemia.  The history is provided by the patient.    Past Medical History  Diagnosis Date  . Von Willebrand disease     bruising easy  . Anemia 11/26/2011  . POTS (postural orthostatic tachycardia syndrome) 11/26/2011  . POTS (postural orthostatic tachycardia syndrome)   . Asthma   . Anxiety   . Depression   . H/O hiatal hernia   . Celiac and mesenteric artery injury   . Celiac and mesenteric artery injury    Past Surgical History  Procedure Laterality Date  . Abdominal hysterectomy    . Laparoscopic endometriosis fulguration    . Roux-en-y procedure    . Cholecystectomy    . Anterior cervical decomp/discectomy fusion N/A 03/31/2013    Procedure: ANTERIOR CERVICAL DECOMPRESSION/DISCECTOMY FUSION 1 LEVEL Cervical five-six;  Surgeon: Faythe Ghee, MD;  Location: [facility];   Service: Neurosurgery;  Laterality: N/A;   Family History  Problem Relation Age of Onset  . Hypertension Mother    History  Substance Use Topics  . Smoking status: Never Smoker   . Smokeless tobacco: Never Used  . Alcohol Use: 3.0 oz/week    5 Glasses of wine per week   OB History   Grav Para Term Preterm Abortions TAB SAB Ect Mult Living                 Review of Systems  All other systems reviewed and are negative.    Allergies  Shellfish allergy; Reglan; Wheat bran; Sulfa antibiotics; and Tartrazine  Home Medications   Current Outpatient Rx  Name  Route  Sig  Dispense  Refill  . budesonide-formoterol (SYMBICORT) 80-4.5 MCG/ACT inhaler   Inhalation   Inhale 2 puffs into the lungs daily.         . cyclobenzaprine (FLEXERIL) 10 MG tablet   Oral   Take 10 mg by mouth 3 (three) times daily as needed for muscle spasms.         Marland Kitchen diltiazem (DILACOR XR) 180 MG 24 hr capsule   Oral   Take 180 mg by mouth daily.         . DULoxetine (CYMBALTA) 60 MG capsule   Oral   Take 60 mg by mouth daily.         Marland Kitchen levalbuterol [facility]  HFA) 45 MCG/ACT inhaler   Inhalation   Inhale 1-2 puffs into the lungs every 4 (four) hours as needed for shortness of breath. For shortness of breath         . Lurasidone HCl (LATUDA) 20 MG TABS   Oral   Take 20 mg by mouth.         . metoprolol (LOPRESSOR) 50 MG tablet   Oral   Take 75 mg by mouth 2 (two) times daily.         . montelukast (SINGULAIR) 10 MG tablet   Oral   Take 10 mg by mouth at bedtime.         Marland Kitchen zolpidem (AMBIEN) 10 MG tablet   Oral   Take 10 mg by mouth at bedtime.         Marland Kitchen albuterol (PROVENTIL HFA;VENTOLIN HFA) 108 (90 BASE) MCG/ACT inhaler   Inhalation   Inhale 2 puffs into the lungs every 6 (six) hours as needed for wheezing. For shortness of breath         . cyanocobalamin (,VITAMIN B-12,) 1000 MCG/ML injection   Intramuscular   Inject 1,000 mcg into the muscle every 30 (thirty) days.          Marland Kitchen dexamethasone (DECADRON) 1 MG tablet      Take at 9-10 pm, the night before blood test   1 tablet   0   . EPINEPHrine (EPIPEN) 0.3 mg/0.3 mL DEVI   Intramuscular   Inject 0.3 mLs (0.3 mg total) into the muscle as needed.   2 Device   1   . oxyCODONE-acetaminophen (PERCOCET/ROXICET) 5-325 MG per tablet   Oral   Take 2 tablets by mouth every 4 (four) hours as needed for severe pain.   6 tablet   0    BP 139/93  Pulse 97  Temp(Src) 97.3 F (36.3 C) (Oral)  Resp 16  Ht 5' 9"  (1.753 m)  Wt 198 lb 4.8 oz (89.948 kg)  BMI 29.27 kg/m2  SpO2 100% Physical Exam  Nursing note and vitals reviewed. Constitutional: She appears well-developed and well-nourished. No distress.  HENT:  Head: Normocephalic and atraumatic.  Mouth/Throat: Oropharynx is clear and moist. No oropharyngeal exudate.  Eyes: Conjunctivae and EOM are normal. Pupils are equal, round, and reactive to light. Right eye exhibits no discharge. Left eye exhibits no discharge. No scleral icterus.  Neck: Normal range of motion. Neck supple. No JVD present. No thyromegaly present.  Well-healing scar to the right anterior neck  Cardiovascular: Normal rate, regular rhythm, normal heart sounds and intact distal pulses.  Exam reveals no gallop and no friction rub.   No murmur heard. Pulmonary/Chest: Effort normal and breath sounds normal. No respiratory distress. She has no wheezes. She has no rales.  Musculoskeletal: Normal range of motion. She exhibits no edema and no tenderness.  Slight swelling of the left hand and third fourth and fifth digits of the left hand. Normal range of motion and strength of the left hand in the median ulnar and radial nerve distributions  Lymphadenopathy:    She has no cervical adenopathy.  Neurological: She is alert. Coordination normal.  Essentially normal sensation to the left upper extremity, normal reflexes at the biceps and brachial radialis.  Skin: Skin is warm and dry. No rash  noted. No erythema.  Minimal scant purple hue to the dorsum of the left hand and the fingers on the ulnar side of the hand, palms on both hands appear slightly pale  Psychiatric: She has a normal mood and affect. Her behavior is normal.    ED Course  Procedures (including critical care time) Labs Review Labs Reviewed  CBC WITH DIFFERENTIAL   Imaging Review No results found.  EKG Interpretation   None       MDM   1. Cervical radiculopathy at C8    Pt has abnormal appearing hand but has normal Cap Refill, normal pulses at the hand at radial and ulnar arteries and normal strength in all nerve distributions.  She has pain in the C 8 distribution.  Check H/H, consider REynauds, but has pain c/w a radiculopathy of C8  H/h normal, d/w Dr. Saintclair Halsted re care - agrees no need for acute intervention with imaging - pain control and f/u - pt can't take steroids / nsaids -   Meds given in ED:  Medications  oxyCODONE-acetaminophen (PERCOCET/ROXICET) 5-325 MG per tablet 2 tablet (not administered)    New Prescriptions   OXYCODONE-ACETAMINOPHEN (PERCOCET/ROXICET) 5-325 MG PER TABLET    Take 2 tablets by mouth every 4 (four) hours as needed for severe pain.      Johnna Acosta, MD 09/03/13 [phone]

## 2013-09-02 NOTE — ED Notes (Signed)
Pt. reports left hand numbness / mild cyanosis and cool to touch onset this evening , denies injury , pt. stated history of cervical surgery last 03/31/2013. Pt. fell and hit her head 2 weeks ago.

## 2013-09-03 LAB — CBC WITH DIFFERENTIAL/PLATELET
Basophils Absolute: 0 10*3/uL (ref 0.0–0.1)
Basophils Relative: 0 % (ref 0–1)
Eosinophils Absolute: 0.1 10*3/uL (ref 0.0–0.7)
Eosinophils Relative: 1 % (ref 0–5)
HCT: 37.2 % (ref 36.0–46.0)
Lymphocytes Relative: 22 % (ref 12–46)
Lymphs Abs: 2.2 10*3/uL (ref 0.7–4.0)
MCHC: 35.5 g/dL (ref 30.0–36.0)
MCV: 95.9 fL (ref 78.0–100.0)
Monocytes Absolute: 0.9 10*3/uL (ref 0.1–1.0)
Platelets: 251 10*3/uL (ref 150–400)
RBC: 3.88 MIL/uL (ref 3.87–5.11)
RDW: 13.9 % (ref 11.5–15.5)
WBC: 9.9 10*3/uL (ref 4.0–10.5)

## 2013-09-03 MED ORDER — OXYCODONE-ACETAMINOPHEN 5-325 MG PO TABS: 2.0000 | ORAL_TABLET | ORAL | Status: DC | PRN

## 2013-09-03 MED ORDER — OXYCODONE-ACETAMINOPHEN 5-325 MG PO TABS
2.0000 | ORAL_TABLET | Freq: Once | ORAL | Status: AC
  Administered 2013-09-03: 2 via ORAL
  Filled 2013-09-03: qty 2

## 2013-09-07 ENCOUNTER — Ambulatory Visit (HOSPITAL_BASED_OUTPATIENT_CLINIC_OR_DEPARTMENT_OTHER): Payer: BC Managed Care – PPO | Admitting: Oncology

## 2013-09-07 ENCOUNTER — Other Ambulatory Visit (INDEPENDENT_AMBULATORY_CARE_PROVIDER_SITE_OTHER): Payer: BC Managed Care – PPO

## 2013-09-07 ENCOUNTER — Other Ambulatory Visit (HOSPITAL_BASED_OUTPATIENT_CLINIC_OR_DEPARTMENT_OTHER): Payer: BC Managed Care – PPO | Admitting: Lab

## 2013-09-07 ENCOUNTER — Telehealth: Payer: Self-pay | Admitting: *Deleted

## 2013-09-07 ENCOUNTER — Other Ambulatory Visit: Payer: Self-pay | Admitting: Endocrinology

## 2013-09-07 VITALS — BP 120/81 | HR 93 | Temp 97.6°F | Resp 18 | Ht 69.0 in | Wt 206.1 lb

## 2013-09-07 DIAGNOSIS — D508 Other iron deficiency anemias: Secondary | ICD-10-CM

## 2013-09-07 DIAGNOSIS — R635 Abnormal weight gain: Secondary | ICD-10-CM

## 2013-09-07 DIAGNOSIS — E249 Cushing's syndrome, unspecified: Secondary | ICD-10-CM

## 2013-09-07 DIAGNOSIS — K909 Intestinal malabsorption, unspecified: Secondary | ICD-10-CM

## 2013-09-07 DIAGNOSIS — D649 Anemia, unspecified: Secondary | ICD-10-CM

## 2013-09-07 LAB — COMPREHENSIVE METABOLIC PANEL (CC13)
ALT: 28 U/L (ref 0–55)
AST: 14 U/L (ref 5–34)
Alkaline Phosphatase: 81 U/L (ref 40–150)
BUN: 12.4 mg/dL (ref 7.0–26.0)
Calcium: 9.4 mg/dL (ref 8.4–10.4)
Chloride: 105 mEq/L (ref 98–109)
Creatinine: 0.8 mg/dL (ref 0.6–1.1)
Total Bilirubin: <0.2 mg/dL (ref 0.20–1.20)

## 2013-09-07 LAB — CORTISOL: Cortisol, Plasma: 0.3 ug/dL

## 2013-09-07 NOTE — Patient Instructions (Signed)
We will see you back in January 2015

## 2013-09-07 NOTE — Telephone Encounter (Signed)
appts made and printed...td 

## 2013-09-07 NOTE — Progress Notes (Signed)
OFFICE PROGRESS NOTE  CC Dr. Sharrell Ku  DIAGNOSIS: 32 year old female with iron deficiency anemia secondary to malabsorption and easy bruising.  PRIOR THERAPY:  #1 patient has had parenteral iron for iron deficiency anemia. Currently her counts remain stable.  #2 easy bruising: Patient hasn't had a full workup without anything being revealing.  CURRENT THERAPY:Observation  INTERVAL HISTORY: Kaitlyn Johnson 32 y.o. female returns for Followup visit today. Patient has developed cushingoid syndrome. She is being seen by Dr. Romero Belling. She was recently seen in the emergency room do to left hand radiculopathy. This is now improved. She was also noted to be possibly anemic and therefore she was referred to [place] by her primary care physician. Few days ago patient had a CBC performed that did reveal a normal hemoglobin hematocrit. I have checked iron studies on her today. We discussed extensively patients recent diagnosis of cushingoid. She is very upset however she is dealing with it the best you possibly can MEDICAL HISTORY: Past Medical History  Diagnosis Date  . Von Willebrand disease     bruising easy  . Anemia 11/26/2011  . POTS (postural orthostatic tachycardia syndrome) 11/26/2011  . POTS (postural orthostatic tachycardia syndrome)   . Asthma   . Anxiety   . Depression   . H/O hiatal hernia   . Celiac and mesenteric artery injury   . Celiac and mesenteric artery injury     ALLERGIES:  is allergic to shellfish allergy; reglan; wheat bran; sulfa antibiotics; and tartrazine.  MEDICATIONS:  Current Outpatient Prescriptions  Medication Sig Dispense Refill  . albuterol (PROVENTIL HFA;VENTOLIN HFA) 108 (90 BASE) MCG/ACT inhaler Inhale 2 puffs into the lungs every 6 (six) hours as needed for wheezing. For shortness of breath      . budesonide-formoterol (SYMBICORT) 80-4.5 MCG/ACT inhaler Inhale 2 puffs into the lungs daily.      . cyanocobalamin (,VITAMIN B-12,) 1000  MCG/ML injection Inject 1,000 mcg into the muscle every 30 (thirty) days.      . cyclobenzaprine (FLEXERIL) 10 MG tablet Take 10 mg by mouth 3 (three) times daily as needed for muscle spasms.      Marland Kitchen diltiazem (DILACOR XR) 180 MG 24 hr capsule Take 180 mg by mouth daily.      . DULoxetine (CYMBALTA) 60 MG capsule Take 60 mg by mouth daily.      Marland Kitchen EPINEPHrine (EPIPEN) 0.3 mg/0.3 mL DEVI Inject 0.3 mLs (0.3 mg total) into the muscle as needed.  2 Device  1  . levalbuterol (XOPENEX HFA) 45 MCG/ACT inhaler Inhale 1-2 puffs into the lungs every 4 (four) hours as needed for shortness of breath. For shortness of breath      . Lurasidone HCl (LATUDA) 20 MG TABS Take 20 mg by mouth.      . metoprolol (LOPRESSOR) 50 MG tablet Take 75 mg by mouth 2 (two) times daily.      . montelukast (SINGULAIR) 10 MG tablet Take 10 mg by mouth at bedtime.      Marland Kitchen oxyCODONE-acetaminophen (PERCOCET/ROXICET) 5-325 MG per tablet Take 2 tablets by mouth every 4 (four) hours as needed for severe pain.  6 tablet  0  . zolpidem (AMBIEN) 10 MG tablet Take 10 mg by mouth at bedtime.       No current facility-administered medications for this visit.    SURGICAL HISTORY:  Past Surgical History  Procedure Laterality Date  . Abdominal hysterectomy    . Laparoscopic endometriosis fulguration    . Roux-en-y procedure    .  Cholecystectomy    . Anterior cervical decomp/discectomy fusion N/A 03/31/2013    Procedure: ANTERIOR CERVICAL DECOMPRESSION/DISCECTOMY FUSION 1 LEVEL Cervical five-six;  Surgeon: Reinaldo Meeker, MD;  Location: [facility];  Service: Neurosurgery;  Laterality: N/A;    REVIEW OF SYSTEMS:  Pertinent items are noted in HPI.   PHYSICAL EXAMINATION: General appearance: alert, cooperative and appears stated age Head: Normocephalic, without obvious abnormality, atraumatic Neck: no adenopathy, no carotid bruit, no JVD, supple, symmetrical, trachea midline and thyroid not enlarged, symmetric, no  tenderness/mass/nodules Lymph nodes: Cervical, supraclavicular, and axillary nodes normal. Resp: clear to auscultation bilaterally and normal percussion bilaterally Back: symmetric, no curvature. ROM normal. No CVA tenderness. Cardio: regular rate and rhythm, S1, S2 normal, no murmur, click, rub or gallop GI: soft, non-tender; bowel sounds normal; no masses,  no organomegaly Extremities: extremities normal, atraumatic, no cyanosis or edema and she is noted to have some bruising on the anterior shins. She also has one area of bruising in the upper thigh laterally. Neurologic: Alert and oriented X 3, normal strength and tone. Normal symmetric reflexes. Normal coordination and gait  ECOG PERFORMANCE STATUS: 0 - Asymptomatic  Blood pressure 120/81, pulse 93, temperature 97.6 F (36.4 C), temperature source Oral, resp. rate 18, height 5\' 9"  (1.753 m), weight 206 lb 1.6 oz (93.486 kg).  LABORATORY DATA: Lab Results  Component Value Date   WBC 9.9 09/02/2013   HGB 13.2 09/02/2013   HCT 37.2 09/02/2013   MCV 95.9 09/02/2013   PLT 251 09/02/2013       RADIOGRAPHIC STUDIES:  No results found.  ASSESSMENT: 32 year old female with  #1 iron deficiency anemia secondary to malabsorption.  #2 easy bruising unclear etiology workup unrevealing.  #3 pots being worked up [employer]. Lynnea Ferrier at [facility] cardiology.   PLAN:   #1 patient's CBC looks terrific at this time there is no need for iron infusions. We will continue to monitor her.  #2 easy bruising continue monitoring.  #3 cushingoid   All questions were answered. The patient knows to call the clinic with any problems, questions or concerns. We can certainly see the patient much sooner if necessary.  I spent 20 minutes counseling the patient face to face. The total time spent in the appointment was 30 minutes.    Drue Second, MD Medical/Oncology [facility] [phone] (beeper) [phone]  (Office)  09/07/2013, 3:31 PM

## 2013-09-09 LAB — CORTISOL, URINE, 24 HOUR: RESULTS RECEIVED: 1.74 g/(24.h) (ref 0.63–2.50)

## 2013-09-10 ENCOUNTER — Telehealth: Payer: Self-pay | Admitting: Endocrinology

## 2013-09-14 NOTE — Telephone Encounter (Signed)
Called pt and advised her that Dr Everardo All said he thinks the results are normal, but he has asked the lab for clarification. We will let you know. Pt states that she still feels and looks the same. Be advised.

## 2013-09-14 NOTE — Telephone Encounter (Signed)
i think the results are normal, but i've asked the lab for clarification. i'll let you know.

## 2013-09-18 ENCOUNTER — Telehealth: Payer: Self-pay | Admitting: Endocrinology

## 2013-09-18 NOTE — Telephone Encounter (Signed)
We are awaiting clarification from the lab, but i believe the result will be normal. Please ask your pcp about the blood pressure. Pt not happy. Pt states she has gained a lot of weight. Pt is very miserable. Pt really believes she truly has Cushing's disease. Pt is asking to switch to Dr Elvera Lennox. Please advise.

## 2013-09-18 NOTE — Telephone Encounter (Signed)
ok 

## 2013-09-18 NOTE — Telephone Encounter (Signed)
We are awaiting clarification from the lab, but i believe the result will be normal. Please ask your pcp about the blood pressure.

## 2013-09-18 NOTE — Telephone Encounter (Signed)
Pt called in regarding lab work She also states she went to neuro appt today and found her b/p is increasing  Call back [phone]  Thank You :)

## 2013-09-22 NOTE — Telephone Encounter (Signed)
Be advised. Pt requested to switch to Dr Elvera Lennox. She believes she has Cushings Syndrome and would like to see Dr Elvera Lennox. Appt scheduled for Tues, Dec. 2nd.

## 2013-09-29 ENCOUNTER — Ambulatory Visit (INDEPENDENT_AMBULATORY_CARE_PROVIDER_SITE_OTHER): Payer: BC Managed Care – PPO | Admitting: Internal Medicine

## 2013-09-29 ENCOUNTER — Encounter: Payer: Self-pay | Admitting: Internal Medicine

## 2013-09-29 VITALS — BP 128/82 | HR 80 | Temp 97.4°F | Resp 12 | Wt 215.8 lb

## 2013-09-29 DIAGNOSIS — E249 Cushing's syndrome, unspecified: Secondary | ICD-10-CM

## 2013-09-29 DIAGNOSIS — T50904A Poisoning by unspecified drugs, medicaments and biological substances, undetermined, initial encounter: Secondary | ICD-10-CM

## 2013-09-29 DIAGNOSIS — E242 Drug-induced Cushing's syndrome: Secondary | ICD-10-CM

## 2013-09-29 NOTE — Patient Instructions (Signed)
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 Please return in 2 months. Please join MyChart >> I will send you further instructions through MyChart as soon as your labs are back.

## 2013-09-29 NOTE — Progress Notes (Signed)
Subjective:     Patient ID: Kaitlyn Johnson, female   DOB: August 10, 1981, 32 y.o.   MRN: [id number]  HPI Kaitlyn Johnson is a pleasant 32 y.o. woman, presenting as a self-referral for evaluation for Cushing syndrome. She is here accompanied by her husband, who offers part of the history.  Patient describes that in 02/2013, she had neck pain and stiffness (previous neck injury), also left arm started to go numb >> MRI neck >> ruptured disk C4-C5 >> surgery (Kaitlyn Johnson) in 03/31/2013 >> inflammation >> started steroid injections x 5 starting 06/2013 >> started to notice round face, weight gain >> injections wore off >> pain in neck.  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  She had asthma and was on steroids before, but not as much. She is on Symbicort inhaler now. She has vWD, and was on iv iron infusion, but no Dexamethasone with the infusions.   She c/o: - weight gain 48 lbs in 3 mo - round face - buffalo hump - wide pink-purple stretch marks - facial plethora - joint and mm pain and stiffness, also weakness, especially when goes up stairs - insomnia - not new, has to take Ambien at night, but she feels more that her mind is racing, it is not unusual for her to sit in the middle of the living room crying at 2 in the morning - emotional lability - easy bruising Patient complains about the fact that she change her appearance a lot after the injections. She shows me a picture from March of this year and, indeed, her appearance is completely changed. She tells me that she was accused of not being the person whose picture appears in her driver's license!  Patient was recently seen by Kaitlyn Johnson ordered the following tests: - Dexamethasone suppression test, with a subsequent 8 AM cortisol of 0.3 - 24-hour urine collection for cortisol, with a cortisol level  undetectable:  Component     Latest Ref Rng 09/04/2013 09/07/2013  Cortisol (Ur), Free     4.0 - 50.0 mcg/24 h undetectable   Creatinine     0.63 - 2.50 g/24 h 1.74   Cortisol, Plasma, post-Dexamethasone       0.3   She was told that she does not have Cushing, but she feels that something is wrong with her body, and this was caused by her injections.   She tells me that, unfortunately, she might need to get more injections in the future.  Review of Systems Constitutional: + see HPI Eyes: no blurry vision, no xerophthalmia ENT: no sore throat, no nodules palpated in throat, no dysphagia/odynophagia, no hoarseness Cardiovascular: no CP/+SOB - with exertion/no palpitations/+face and leg swelling Respiratory: no cough/SOB Gastrointestinal: no N/V/D/C Musculoskeletal:+ muscle/+ joint aches Skin: no rashes, + new stretch marks, hair breakage and loss Neurological: no tremors/numbness/tingling/dizziness Psychiatric: + depression/+ anxiety  Past Medical History  Diagnosis Date  . Von Willebrand disease     bruising easy  . Anemia 11/26/2011  . POTS (postural orthostatic tachycardia syndrome) 11/26/2011  . POTS (postural orthostatic tachycardia syndrome)   . Asthma   . Anxiety   . Depression   . H/O hiatal hernia   . Celiac and mesenteric artery injury   . Celiac and mesenteric artery injury    Past Surgical History  Procedure Laterality Date  . Abdominal hysterectomy    .  Laparoscopic endometriosis fulguration    . Roux-en-y procedure    . Cholecystectomy    . Anterior cervical decomp/discectomy fusion N/A 03/31/2013    Procedure: ANTERIOR CERVICAL DECOMPRESSION/DISCECTOMY FUSION 1 LEVEL Cervical five-six;  Surgeon: Kaitlyn Meeker, MD;  Location: [facility];  Service: Neurosurgery;  Laterality: N/A;   History   Social History  . Marital Status: Married    Spouse Name: N/A    Number of Children: 0   Occupational History  . photographer   Social History Main Topics  .  Smoking status: Never Smoker   . Smokeless tobacco: Never Used  . Alcohol Use: 3.0 oz/week    5 Glasses of wine per week  . Drug Use: No  . Sexual Activity: Yes    Partners: Male    Birth Control/ Protection: None, Surgical, had hysterectomy and USO in 2011   Social History Narrative   Regular exercise: can't at this time   Caffeine use: no   Current Outpatient Prescriptions on File Prior to Visit  Medication Sig Dispense Refill  . albuterol (PROVENTIL HFA;VENTOLIN HFA) 108 (90 BASE) MCG/ACT inhaler Inhale 2 puffs into the lungs every 6 (six) hours as needed for wheezing. For shortness of breath      . budesonide-formoterol (SYMBICORT) 80-4.5 MCG/ACT inhaler Inhale 2 puffs into the lungs daily.      . cyanocobalamin (,VITAMIN B-12,) 1000 MCG/ML injection Inject 1,000 mcg into the muscle every 30 (thirty) days.      . cyclobenzaprine (FLEXERIL) 10 MG tablet Take 10 mg by mouth 3 (three) times daily as needed for muscle spasms.      Marland Kitchen diltiazem (DILACOR XR) 180 MG 24 hr capsule Take 180 mg by mouth daily.      . DULoxetine (CYMBALTA) 60 MG capsule Take 60 mg by mouth daily.      Marland Kitchen EPINEPHrine (EPIPEN) 0.3 mg/0.3 mL DEVI Inject 0.3 mLs (0.3 mg total) into the muscle as needed.  2 Device  1  . levalbuterol (XOPENEX HFA) 45 MCG/ACT inhaler Inhale 1-2 puffs into the lungs every 4 (four) hours as needed for shortness of breath. For shortness of breath      . Lurasidone HCl (LATUDA) 20 MG TABS Take 20 mg by mouth.      . metoprolol (LOPRESSOR) 50 MG tablet Take 75 mg by mouth 2 (two) times daily.      . montelukast (SINGULAIR) 10 MG tablet Take 10 mg by mouth at bedtime.      Marland Kitchen oxyCODONE-acetaminophen (PERCOCET/ROXICET) 5-325 MG per tablet Take 2 tablets by mouth every 4 (four) hours as needed for severe pain.  6 tablet  0  . zolpidem (AMBIEN) 10 MG tablet Take 10 mg by mouth at bedtime.       No current facility-administered medications on file prior to visit.   Allergies  Allergen Reactions   . Shellfish Allergy Anaphylaxis  . Reglan [Metoclopramide] Other (See Comments)    oculogyral crisis  . Wheat Bran Other (See Comments)    Pt has Celiac disease  . Sulfa Antibiotics Rash  . Tartrazine Rash   Family History  Problem Relation Age of Onset  . Hypertension Mother    Objective:   Physical Exam BP 128/82  Pulse 80  Temp(Src) 97.4 F (36.3 C) (Oral)  Resp 12  Wt 215 lb 12.8 oz (97.886 kg)  SpO2 98%    Constitutional: overweight, in NAD, moon facies, facial plethora, full supraclavicular fat pads Eyes: PERRLA, EOMI, no exophthalmos ENT:  moist mucous membranes, no thyromegaly, no cervical lymphadenopathy Cardiovascular: RRR, No MRG Respiratory: CTA B Gastrointestinal: abdomen soft, NT, ND, BS+ Musculoskeletal: no deformities, strength intact in Johnson 4 Skin: moist, warm, no rashes - stretch marks: wide, pink-purple on abdomen Neurological: no tremor with outstretched hands, DTR normal in Johnson 4  Assessment:     Exogenous Cushing Syndrome - 2/2 5 cervical spine steroid injections     Plan:     Patient with striking change in appearance and weight gain after her steroid injections. She developed almost Johnson of the symptoms and signs of Cushing syndrome, however, with an undetectable 24-hour urinary cortisol, which is specific for exogenous (iatrogenic) Cushing's syndrome. - In her case, a high dose of steroids triggered her cushingoid transformation, and, unfortunately, some of the steroids are persisting in her system, therefore, she continues to weight gain and change appearance. A high exogenous steroid intake can suppress the adrenals >> these patients can become frankly adrenally insufficient, despite their cushingoid appearance - I have no available tests to check for synthetic glucocorticoid levels in serum or urine (I checked with the lab), therefore, we have to rely on her cortisol level to see she needs replacement. - would check an ACTH and cortisol at 8 AM.  If this is low, we will likely need to start her on low-dose hydrocortisone treatment, and basically treat her as if she is adrenal insufficient. - I will call her back in 2 months, at that time, she will need to skip the hydrocortisone the day before the visit, and hopefully we can do a cosyntropin stimulation test at that time to see if we can withdraw her hydrocortisone. - I discussed with her and her husband that the SEs from the injections may persist in her body for up to 1 year, but usually her appearance and symptoms will improve as the steroids will wear off - I advised her to try to stay away from steroid injections as much as she can - RTC in 2 months, but I will be in touch with her through my chart   PROLACTIN      Result Value Range   Prolactin 34.8     Narrative:    Performed at:  [facility]                [address], [address]                [place], [place] [address]  Reference Ranges: Female: 2.1 - 17.1 ng/ml Female: Pregnant 9.7 - 208.5 ng/mL Non Pregnant 2.8 - 29.2 ng/mL Post Menopausal 1.8 - 20.3 ng/mL   CORTISOL      Result Value Range   Cortisol, Plasma <0.2     Narrative:    Performed at:  [facility]                [address], [address]                [place], [place] [address]  AM: 4.3 - 22.4 ug/dL PM: 3.1 - 13.0 ug/dL    ACTH <8.6  Cortisol and ACTH undetectable, which can mean: 1. Secondary adrenal insufficiency (no corticosteroids in the system) 2. Exogenous Cushing (presence of synthetic corticosteroids in the system, not detected by the assay) Since I cannot differentiate between the above problems, we will need to treat this as adrenal insufficiency. Prolactin mildly elevated, I would like to recheck it at next visit.  Called patient and discussed  with her about the results and the fact that we need to start hydrocortisone 10 mg in a.m. and 5 mg in p.m. I advised her that she might not see a difference in how she feels in  case she still has a lot of synthetic steroids in her system. If she's truly adrenally insufficient, she would feel better on the hydrocortisone.   I advised her about sick days rules while in hydrocortisone and I also sent her the following message through my chart:  Dear Kaitlyn Eulah Pont, Here are the results of your first 2 tests. As we discussed over the phone: start 10 mg of hydrocortisone in am and 5 in pm, around 2-3 pm. - You absolutely need to take this medication every day and not skip doses. - Please double the dose if you have a fever, for the duration of the fever. - If you cannot take anything by mouth (vomiting) or you have severe diarrhea so that you eliminate the hydrocortisone pills in your stool, go to the nearest emergency department/urgent care or you may go to your PCPs office to get the medication in the vein or muscle. Alternatively, you can inject 100 mg of hydrocortisone in the muscle. - Please try to get a MedAlert bracelet or pendant indicating: "Adrenal insufficiency". I sent the prescriptions for tablets, solution and syringes to your pharmacy. Please skip the pm dose of hydrocortisone before our next visit >> let's try to schedule this at 8 am in 2 months. Please call or write me a message with any questions. Sincerely, Carlus Pavlov MD  ACTH returned after the above message was sent. A new message was sent with the ACTH result. No change in management.

## 2013-09-30 ENCOUNTER — Other Ambulatory Visit: Payer: Self-pay | Admitting: Internal Medicine

## 2013-10-01 ENCOUNTER — Encounter: Payer: Self-pay | Admitting: Internal Medicine

## 2013-10-01 LAB — CORTISOL: Cortisol, Plasma: <0.2 ug/dL

## 2013-10-01 LAB — PROLACTIN: Prolactin: 34.8 ng/mL

## 2013-10-01 MED ORDER — HYDROCORTISONE 5 MG PO TABS: ORAL_TABLET | ORAL | Status: DC

## 2013-10-01 MED ORDER — HYDROCORTISONE SOD SUCCINATE 100 MG IJ SOLR: INTRAMUSCULAR | Status: DC

## 2013-10-01 MED ORDER — "SYRINGE 25G X 1"" 3 ML MISC": Status: DC

## 2013-10-02 ENCOUNTER — Encounter: Payer: Self-pay | Admitting: Internal Medicine

## 2013-10-02 DIAGNOSIS — E242 Drug-induced Cushing's syndrome: Secondary | ICD-10-CM

## 2013-10-02 HISTORY — DX: Drug-induced Cushing's syndrome: E24.2

## 2013-10-03 ENCOUNTER — Encounter: Payer: Self-pay | Admitting: Internal Medicine

## 2013-10-16 ENCOUNTER — Encounter: Payer: Self-pay | Admitting: Internal Medicine

## 2013-11-17 ENCOUNTER — Other Ambulatory Visit: Payer: BC Managed Care – PPO

## 2013-11-17 ENCOUNTER — Ambulatory Visit: Payer: BC Managed Care – PPO | Admitting: Oncology

## 2013-11-22 DIAGNOSIS — Z79899 Other long term (current) drug therapy: Secondary | ICD-10-CM

## 2013-11-22 DIAGNOSIS — I10 Essential (primary) hypertension: Secondary | ICD-10-CM | POA: Diagnosis present

## 2013-11-22 DIAGNOSIS — D638 Anemia in other chronic diseases classified elsewhere: Secondary | ICD-10-CM | POA: Diagnosis present

## 2013-11-22 DIAGNOSIS — F411 Generalized anxiety disorder: Secondary | ICD-10-CM | POA: Diagnosis present

## 2013-11-22 DIAGNOSIS — F3289 Other specified depressive episodes: Secondary | ICD-10-CM | POA: Diagnosis present

## 2013-11-22 DIAGNOSIS — E876 Hypokalemia: Secondary | ICD-10-CM | POA: Diagnosis present

## 2013-11-22 DIAGNOSIS — K900 Celiac disease: Secondary | ICD-10-CM | POA: Diagnosis present

## 2013-11-22 DIAGNOSIS — E871 Hypo-osmolality and hyponatremia: Secondary | ICD-10-CM | POA: Diagnosis present

## 2013-11-22 DIAGNOSIS — A0811 Acute gastroenteropathy due to Norwalk agent: Principal | ICD-10-CM | POA: Diagnosis present

## 2013-11-22 DIAGNOSIS — D6800 Von Willebrand disease, unspecified: Secondary | ICD-10-CM | POA: Diagnosis present

## 2013-11-22 DIAGNOSIS — Z8249 Family history of ischemic heart disease and other diseases of the circulatory system: Secondary | ICD-10-CM

## 2013-11-22 DIAGNOSIS — E249 Cushing's syndrome, unspecified: Secondary | ICD-10-CM | POA: Diagnosis present

## 2013-11-22 DIAGNOSIS — F329 Major depressive disorder, single episode, unspecified: Secondary | ICD-10-CM | POA: Diagnosis present

## 2013-11-22 DIAGNOSIS — J45909 Unspecified asthma, uncomplicated: Secondary | ICD-10-CM | POA: Diagnosis present

## 2013-11-22 DIAGNOSIS — I498 Other specified cardiac arrhythmias: Secondary | ICD-10-CM | POA: Diagnosis present

## 2013-11-23 ENCOUNTER — Telehealth: Payer: Self-pay | Admitting: Internal Medicine

## 2013-11-23 ENCOUNTER — Inpatient Hospital Stay (HOSPITAL_COMMUNITY)
Admission: EM | Admit: 2013-11-23 | Discharge: 2013-11-26 | DRG: 392 | Disposition: A | Discharge status: Home / Self Care | Payer: BC Managed Care – PPO | Attending: Internal Medicine | Admitting: Internal Medicine

## 2013-11-23 ENCOUNTER — Encounter (HOSPITAL_COMMUNITY): Payer: Self-pay | Admitting: Emergency Medicine

## 2013-11-23 DIAGNOSIS — E860 Dehydration: Secondary | ICD-10-CM

## 2013-11-23 DIAGNOSIS — R112 Nausea with vomiting, unspecified: Secondary | ICD-10-CM | POA: Diagnosis present

## 2013-11-23 DIAGNOSIS — E871 Hypo-osmolality and hyponatremia: Secondary | ICD-10-CM | POA: Diagnosis present

## 2013-11-23 DIAGNOSIS — G90A Postural orthostatic tachycardia syndrome (POTS): Secondary | ICD-10-CM | POA: Diagnosis present

## 2013-11-23 DIAGNOSIS — J45909 Unspecified asthma, uncomplicated: Secondary | ICD-10-CM | POA: Diagnosis present

## 2013-11-23 DIAGNOSIS — E242 Drug-induced Cushing's syndrome: Secondary | ICD-10-CM | POA: Diagnosis present

## 2013-11-23 DIAGNOSIS — R509 Fever, unspecified: Secondary | ICD-10-CM

## 2013-11-23 DIAGNOSIS — D638 Anemia in other chronic diseases classified elsewhere: Secondary | ICD-10-CM | POA: Diagnosis present

## 2013-11-23 DIAGNOSIS — K900 Celiac disease: Secondary | ICD-10-CM | POA: Diagnosis present

## 2013-11-23 DIAGNOSIS — A0811 Acute gastroenteropathy due to Norwalk agent: Secondary | ICD-10-CM | POA: Diagnosis present

## 2013-11-23 DIAGNOSIS — R000 Tachycardia, unspecified: Secondary | ICD-10-CM

## 2013-11-23 DIAGNOSIS — I498 Other specified cardiac arrhythmias: Secondary | ICD-10-CM | POA: Diagnosis present

## 2013-11-23 DIAGNOSIS — R197 Diarrhea, unspecified: Secondary | ICD-10-CM

## 2013-11-23 DIAGNOSIS — E876 Hypokalemia: Secondary | ICD-10-CM | POA: Diagnosis present

## 2013-11-23 DIAGNOSIS — I951 Orthostatic hypotension: Secondary | ICD-10-CM

## 2013-11-23 HISTORY — DX: Diarrhea, unspecified: R19.7

## 2013-11-23 HISTORY — DX: Nausea with vomiting, unspecified: R11.2

## 2013-11-23 LAB — COMPREHENSIVE METABOLIC PANEL
ALK PHOS: 85 U/L (ref 39–117)
ALT: 21 U/L (ref 0–35)
ALT: 17 U/L (ref 0–35)
AST: 17 U/L (ref 0–37)
AST: 17 U/L (ref 0–37)
Albumin: 3.8 g/dL (ref 3.5–5.2)
Albumin: 2.8 g/dL — ABNORMAL LOW (ref 3.5–5.2)
Alkaline Phosphatase: 56 U/L (ref 39–117)
BUN: 14 mg/dL (ref 6–23)
BUN: 6 mg/dL (ref 6–23)
CO2: 21 mEq/L (ref 19–32)
CO2: 22 mEq/L (ref 19–32)
CREATININE: 0.64 mg/dL (ref 0.50–1.10)
Calcium: 8.7 mg/dL (ref 8.4–10.5)
Calcium: 7.1 mg/dL — ABNORMAL LOW (ref 8.4–10.5)
Chloride: 98 mEq/L (ref 96–112)
Chloride: 99 mEq/L (ref 96–112)
Creatinine, Ser: 0.79 mg/dL (ref 0.50–1.10)
GFR calc Af Amer: >90 mL/min (ref >90)
GFR calc non Af Amer: >90 mL/min (ref >90)
Glucose, Bld: 91 mg/dL (ref 70–99)
Glucose, Bld: 97 mg/dL (ref 70–99)
Potassium: 3.8 mEq/L (ref 3.7–5.3)
Potassium: 3.4 mEq/L — ABNORMAL LOW (ref 3.7–5.3)
Sodium: 136 mEq/L — ABNORMAL LOW (ref 137–147)
Sodium: 134 mEq/L — ABNORMAL LOW (ref 137–147)
TOTAL PROTEIN: 7.3 g/dL (ref 6.0–8.3)
TOTAL PROTEIN: 6 g/dL (ref 6.0–8.3)
Total Bilirubin: 0.3 mg/dL (ref 0.3–1.2)
Total Bilirubin: 0.3 mg/dL (ref 0.3–1.2)

## 2013-11-23 LAB — CBC WITH DIFFERENTIAL/PLATELET
BASOS ABS: 0 10*3/uL (ref 0.0–0.1)
Basophils Absolute: 0 10*3/uL (ref 0.0–0.1)
Basophils Relative: 0 % (ref 0–1)
Basophils Relative: 0 % (ref 0–1)
EOS PCT: 0 % (ref 0–5)
Eosinophils Absolute: 0 10*3/uL (ref 0.0–0.7)
Eosinophils Absolute: 0 10*3/uL (ref 0.0–0.7)
Eosinophils Relative: 0 % (ref 0–5)
HEMATOCRIT: 40.9 % (ref 36.0–46.0)
HEMATOCRIT: 34.2 % — AB (ref 36.0–46.0)
HEMOGLOBIN: 11.5 g/dL — AB (ref 12.0–15.0)
Hemoglobin: 13.7 g/dL (ref 12.0–15.0)
LYMPHS ABS: 0.5 10*3/uL — AB (ref 0.7–4.0)
LYMPHS PCT: 14 % (ref 12–46)
LYMPHS PCT: 7 % — AB (ref 12–46)
Lymphs Abs: 0.8 10*3/uL (ref 0.7–4.0)
MCH: 33.8 pg (ref 26.0–34.0)
MCH: 33.6 pg (ref 26.0–34.0)
MCHC: 33.5 g/dL (ref 30.0–36.0)
MCHC: 33.6 g/dL (ref 30.0–36.0)
MCV: 101 fL — ABNORMAL HIGH (ref 78.0–100.0)
MCV: 100 fL (ref 78.0–100.0)
MONO ABS: 0.2 10*3/uL (ref 0.1–1.0)
MONOS PCT: 8 % (ref 3–12)
Monocytes Absolute: 0.5 10*3/uL (ref 0.1–1.0)
Monocytes Relative: 3 % (ref 3–12)
NEUTROS ABS: 4.7 10*3/uL (ref 1.7–7.7)
NEUTROS PCT: 82 % — AB (ref 43–77)
Neutro Abs: 5.6 10*3/uL (ref 1.7–7.7)
Neutrophils Relative %: 85 % — ABNORMAL HIGH (ref 43–77)
Platelets: 178 10*3/uL (ref 150–400)
Platelets: 189 10*3/uL (ref 150–400)
RBC: 4.05 MIL/uL (ref 3.87–5.11)
RBC: 3.42 MIL/uL — ABNORMAL LOW (ref 3.87–5.11)
RDW: 12.5 % (ref 11.5–15.5)
RDW: 12.6 % (ref 11.5–15.5)
WBC: 5.8 10*3/uL (ref 4.0–10.5)
WBC: 6.6 10*3/uL (ref 4.0–10.5)

## 2013-11-23 LAB — PROTIME-INR
INR: 0.96 (ref 0.00–1.49)
Prothrombin Time: 12.6 seconds (ref 11.6–15.2)

## 2013-11-23 LAB — PHOSPHORUS: PHOSPHORUS: 2.5 mg/dL (ref 2.3–4.6)

## 2013-11-23 LAB — MAGNESIUM: Magnesium: 1.4 mg/dL — ABNORMAL LOW (ref 1.5–2.5)

## 2013-11-23 LAB — PRO B NATRIURETIC PEPTIDE
PRO B NATRI PEPTIDE: 356 pg/mL — AB (ref 0–125)
Pro B Natriuretic peptide (BNP): 408.5 pg/mL — ABNORMAL HIGH (ref 0–125)

## 2013-11-23 LAB — TROPONIN I

## 2013-11-23 LAB — CORTISOL: Cortisol, Plasma: 38.5 ug/dL

## 2013-11-23 LAB — APTT: APTT: 25 s (ref 24–37)

## 2013-11-23 MED ORDER — MORPHINE SULFATE 4 MG/ML IJ SOLN
4.0000 mg | Freq: Once | INTRAMUSCULAR | Status: AC
  Administered 2013-11-23: 4 mg via INTRAVENOUS
  Filled 2013-11-23: qty 1

## 2013-11-23 MED ORDER — HYDROCORTISONE 5 MG PO TABS
5.0000 mg | ORAL_TABLET | Freq: Every day | ORAL | Status: DC
  Filled 2013-11-23: qty 1

## 2013-11-23 MED ORDER — HYDROCORTISONE 10 MG PO TABS
10.0000 mg | ORAL_TABLET | Freq: Every day | ORAL | Status: DC
  Administered 2013-11-23: 10 mg via ORAL
  Filled 2013-11-23 (×2): qty 2

## 2013-11-23 MED ORDER — POTASSIUM CHLORIDE CRYS ER 20 MEQ PO TBCR
40.0000 meq | EXTENDED_RELEASE_TABLET | Freq: Once | ORAL | Status: DC
  Filled 2013-11-23: qty 2

## 2013-11-23 MED ORDER — ACETAMINOPHEN 650 MG RE SUPP: 650.0000 mg | Freq: Four times a day (QID) | RECTAL | Status: DC | PRN

## 2013-11-23 MED ORDER — LORAZEPAM 2 MG/ML IJ SOLN: 1.0000 mg | Freq: Three times a day (TID) | INTRAMUSCULAR | Status: DC

## 2013-11-23 MED ORDER — DULOXETINE HCL 60 MG PO CPEP
60.0000 mg | ORAL_CAPSULE | Freq: Every day | ORAL | Status: DC
  Administered 2013-11-23 – 2013-11-26 (×4): 60 mg via ORAL
  Filled 2013-11-23 (×5): qty 1

## 2013-11-23 MED ORDER — SODIUM CHLORIDE 0.9 % IV BOLUS (SEPSIS)
1000.0000 mL | Freq: Once | INTRAVENOUS | Status: AC
  Administered 2013-11-23: 1000 mL via INTRAVENOUS

## 2013-11-23 MED ORDER — MAGNESIUM SULFATE 40 MG/ML IJ SOLN
2.0000 g | Freq: Once | INTRAMUSCULAR | Status: AC
  Administered 2013-11-23: 2 g via INTRAVENOUS
  Filled 2013-11-23: qty 50

## 2013-11-23 MED ORDER — PROMETHAZINE HCL 25 MG/ML IJ SOLN
12.5000 mg | Freq: Once | INTRAMUSCULAR | Status: AC
  Administered 2013-11-23: 12.5 mg via INTRAVENOUS
  Filled 2013-11-23: qty 1

## 2013-11-23 MED ORDER — HYDROCORTISONE SOD SUCCINATE 100 MG IJ SOLR
100.0000 mg | Freq: Once | INTRAMUSCULAR | Status: AC
  Administered 2013-11-23: 100 mg via INTRAVENOUS
  Filled 2013-11-23: qty 2

## 2013-11-23 MED ORDER — MORPHINE SULFATE 2 MG/ML IJ SOLN
1.0000 mg | INTRAMUSCULAR | Status: DC | PRN
  Administered 2013-11-24 – 2013-11-25 (×3): 1 mg via INTRAVENOUS
  Filled 2013-11-23 (×3): qty 1

## 2013-11-23 MED ORDER — CLONAZEPAM 1 MG PO TABS
1.0000 mg | ORAL_TABLET | Freq: Two times a day (BID) | ORAL | Status: DC | PRN
  Administered 2013-11-25: 1 mg via ORAL
  Filled 2013-11-23: qty 1

## 2013-11-23 MED ORDER — LURASIDONE HCL 20 MG PO TABS
20.0000 mg | ORAL_TABLET | Freq: Every evening | ORAL | Status: DC
  Administered 2013-11-23 – 2013-11-25 (×3): 20 mg via ORAL
  Filled 2013-11-23 (×5): qty 1

## 2013-11-23 MED ORDER — METOPROLOL TARTRATE 50 MG PO TABS
75.0000 mg | ORAL_TABLET | Freq: Two times a day (BID) | ORAL | Status: DC
  Administered 2013-11-23 – 2013-11-26 (×7): 75 mg via ORAL
  Filled 2013-11-23 (×4): qty 1
  Filled 2013-11-23: qty 3
  Filled 2013-11-23 (×3): qty 1

## 2013-11-23 MED ORDER — ZOLPIDEM TARTRATE 10 MG PO TABS
10.0000 mg | ORAL_TABLET | Freq: Every day | ORAL | Status: DC
  Administered 2013-11-23 – 2013-11-25 (×2): 10 mg via ORAL
  Filled 2013-11-23 (×2): qty 1

## 2013-11-23 MED ORDER — GI COCKTAIL ~~LOC~~
30.0000 mL | Freq: Once | ORAL | Status: AC
  Administered 2013-11-23: 30 mL via ORAL
  Filled 2013-11-23: qty 30

## 2013-11-23 MED ORDER — HYDROCODONE-ACETAMINOPHEN 5-325 MG PO TABS
1.0000 | ORAL_TABLET | ORAL | Status: DC | PRN
  Administered 2013-11-24: 2 via ORAL
  Filled 2013-11-23: qty 2

## 2013-11-23 MED ORDER — MONTELUKAST SODIUM 10 MG PO TABS
10.0000 mg | ORAL_TABLET | Freq: Every day | ORAL | Status: DC
  Administered 2013-11-23 – 2013-11-25 (×3): 10 mg via ORAL
  Filled 2013-11-23 (×5): qty 1

## 2013-11-23 MED ORDER — GI COCKTAIL ~~LOC~~
30.0000 mL | Freq: Three times a day (TID) | ORAL | Status: DC | PRN
  Administered 2013-11-23 – 2013-11-26 (×6): 30 mL via ORAL
  Filled 2013-11-23 (×6): qty 30

## 2013-11-23 MED ORDER — ACETAMINOPHEN 325 MG PO TABS
650.0000 mg | ORAL_TABLET | Freq: Four times a day (QID) | ORAL | Status: DC | PRN
  Administered 2013-11-23 – 2013-11-24 (×2): 650 mg via ORAL
  Filled 2013-11-23 (×2): qty 2

## 2013-11-23 MED ORDER — POTASSIUM CHLORIDE 10 MEQ/100ML IV SOLN
10.0000 meq | INTRAVENOUS | Status: AC
  Administered 2013-11-23 – 2013-11-24 (×4): 10 meq via INTRAVENOUS
  Filled 2013-11-23 (×4): qty 100

## 2013-11-23 MED ORDER — HYDROCORTISONE SOD SUCCINATE 100 MG IJ SOLR
50.0000 mg | Freq: Three times a day (TID) | INTRAMUSCULAR | Status: DC
  Administered 2013-11-23 – 2013-11-25 (×5): 50 mg via INTRAVENOUS
  Filled 2013-11-23 (×8): qty 1

## 2013-11-23 MED ORDER — LURASIDONE HCL 20 MG PO TABS: 20.0000 mg | ORAL_TABLET | Freq: Every evening | ORAL | Status: DC

## 2013-11-23 MED ORDER — ALBUTEROL SULFATE HFA 108 (90 BASE) MCG/ACT IN AERS: 2.0000 | INHALATION_SPRAY | Freq: Four times a day (QID) | RESPIRATORY_TRACT | Status: DC | PRN

## 2013-11-23 MED ORDER — SODIUM CHLORIDE 0.9 % IV BOLUS (SEPSIS)
1000.0000 mL | Freq: Once | INTRAVENOUS | Status: AC
Start: 2013-11-23 — End: 2013-11-23
  Administered 2013-11-23: 1000 mL via INTRAVENOUS

## 2013-11-23 MED ORDER — ONDANSETRON HCL 4 MG/2ML IJ SOLN
4.0000 mg | Freq: Once | INTRAMUSCULAR | Status: AC
  Administered 2013-11-23: 4 mg via INTRAVENOUS
  Filled 2013-11-23: qty 2

## 2013-11-23 MED ORDER — DILTIAZEM HCL ER 180 MG PO CP24
180.0000 mg | ORAL_CAPSULE | Freq: Every morning | ORAL | Status: DC
  Administered 2013-11-23 – 2013-11-26 (×4): 180 mg via ORAL
  Filled 2013-11-23 (×4): qty 1

## 2013-11-23 MED ORDER — METOPROLOL TARTRATE 25 MG PO TABS
75.0000 mg | ORAL_TABLET | Freq: Once | ORAL | Status: AC
  Administered 2013-11-23: 75 mg via ORAL
  Filled 2013-11-23: qty 3

## 2013-11-23 MED ORDER — ALBUTEROL SULFATE (2.5 MG/3ML) 0.083% IN NEBU: 2.5000 mg | INHALATION_SOLUTION | Freq: Four times a day (QID) | RESPIRATORY_TRACT | Status: DC | PRN

## 2013-11-23 MED ORDER — LORAZEPAM 2 MG/ML IJ SOLN
1.0000 mg | Freq: Three times a day (TID) | INTRAMUSCULAR | Status: DC | PRN
  Administered 2013-11-23 – 2013-11-24 (×2): 1 mg via INTRAVENOUS
  Filled 2013-11-23 (×2): qty 1

## 2013-11-23 MED ORDER — HYDROCORTISONE 5 MG PO TABS
5.0000 mg | ORAL_TABLET | ORAL | Status: DC
  Administered 2013-11-23: 5 mg via ORAL
  Filled 2013-11-23: qty 1

## 2013-11-23 MED ORDER — PROMETHAZINE HCL 25 MG PO TABS
12.5000 mg | ORAL_TABLET | Freq: Four times a day (QID) | ORAL | Status: DC | PRN
  Administered 2013-11-23 – 2013-11-25 (×5): 12.5 mg via ORAL
  Filled 2013-11-23 (×5): qty 1

## 2013-11-23 MED ORDER — SODIUM CHLORIDE 0.9 % IV SOLN
INTRAVENOUS | Status: DC
  Administered 2013-11-23 – 2013-11-26 (×6): via INTRAVENOUS

## 2013-11-23 MED ORDER — ACETAMINOPHEN 325 MG PO TABS
650.0000 mg | ORAL_TABLET | Freq: Once | ORAL | Status: AC
  Administered 2013-11-23: 650 mg via ORAL
  Filled 2013-11-23: qty 2

## 2013-11-23 NOTE — ED Notes (Signed)
Pt arrived to the ED with a complaint of emesis and diarrhea.  Pt states she has had about 15 episodes of emesis since 2030 hrs yesterday.  Pt has had about 3 episodes of diarrhea.  Pt states she has been unable to eat of drink anything since the symptoms started.  Pt takes metoprolol but threw it back up and is tachycardic in triage.

## 2013-11-23 NOTE — Progress Notes (Signed)
Pt states she is unable to take po potassium as ordered by MD. She states she usually vomits po potassium. Hospitalist on call made aware and new orders received for IV Potassium, pt aware of plan.

## 2013-11-23 NOTE — ED Notes (Signed)
Pt reports being nauseas. PA made aware.

## 2013-11-23 NOTE — H&P (Addendum)
[facility] History and Physical  Kaitlyn Johnson EVO:[id number] DOB: 30-Jun-1981 DOA: 11/23/2013  Referring physician: ER physician PCP: Antony Blackbird, MD   Chief Complaint: nausea. Vomiting, diarrhea  HPI:  33 year old female with past medical history of von Willebrand disease, anemia, celiac's disease, asthma, cushing's disease who presented to [facility] ED 11/23/2013 with ongoing nausea. Vomiting, diarrhea started 1 day prior to this admission. Pt reports having subjective fevers, occasional chills. No reports of blood in stool or urine. No significant abdominal discomfort other than when she has vomiting. She also reports poor oral intake and weakness. No chest pain when initially seen in ED but later on once arrived to floor unit had some complaints of left side chest pain, non radiating and associated with shortness of breath. No lightheadedness or loss of consciousness. In ED, BP was 101/51, HR 115, Tmax 100.6 F and oxygen saturation 95% on room air. Blood work was essentially unremarkable on the admission except for magnesium which was 1.4 and was supplemented.   Assessment and Plan:   Principal Problem:   Nausea vomiting and diarrhea - likely viral gastroenteritis - rule out C.diff - provide supportive care with IV fluids, antiemetics Active Problems:   Celiac disease - stable   Asthma, chronic - stable, continue current home dose of inhaler   Iatrogenic Cushing's syndrome - cortisol level normal on admission - on solu-cortef 50 mg Q 8 hours per endo recomendation   Anemia of chronic disease - secondary to celiac disease   Hypokalemia, Hypomagneseima - repleted    Hyponatremia - likely dehydration  - continue IV fluids   Radiological Exams on Admission: No results found.   Code Status: Full Family Communication: Pt at bedside Disposition Plan: Admit for further evaluation  Leisa Lenz, MD  [facility] Pager [phone]  Review of Systems:   Constitutional: Negative for fever, chills and malaise/fatigue. Negative for diaphoresis.  HENT: Negative for hearing loss, ear pain, nosebleeds, congestion, sore throat, neck pain, tinnitus and ear discharge.   Eyes: Negative for blurred vision, double vision, photophobia, pain, discharge and redness.  Respiratory: Negative for cough, hemoptysis, sputum production, shortness of breath, wheezing and stridor.   Cardiovascular: Negative for chest pain, palpitations, orthopnea, claudication and leg swelling.  Gastrointestinal: per HPI Genitourinary: Negative for dysuria, urgency, frequency, hematuria and flank pain.  Musculoskeletal: Negative for myalgias, back pain, joint pain and falls.  Skin: Negative for itching and rash.  Neurological: Negative for dizziness and weakness. Negative for tingling, tremors, sensory change, speech change, focal weakness, loss of consciousness and headaches.  Endo/Heme/Allergies: Negative for environmental allergies and polydipsia. Does not bruise/bleed easily.  Psychiatric/Behavioral: Negative for suicidal ideas. The patient is not nervous/anxious.      Past Medical History  Diagnosis Date  . Von Willebrand disease     bruising easy  . Anemia 11/26/2011  . POTS (postural orthostatic tachycardia syndrome) 11/26/2011  . POTS (postural orthostatic tachycardia syndrome)   . Asthma   . Anxiety   . Depression   . H/O hiatal hernia   . Celiac and mesenteric artery injury   . Celiac and mesenteric artery injury    Past Surgical History  Procedure Laterality Date  . Abdominal hysterectomy    . Laparoscopic endometriosis fulguration    . Roux-en-y procedure    . Cholecystectomy    . Anterior cervical decomp/discectomy fusion N/A 03/31/2013    Procedure: ANTERIOR CERVICAL DECOMPRESSION/DISCECTOMY FUSION 1 LEVEL Cervical five-six;  Surgeon: Faythe Ghee, MD;  Location: [facility];  Service: Neurosurgery;  Laterality: N/A;   Social History:  reports that she  has never smoked. She has never used smokeless tobacco. She reports that she drinks about 3.0 ounces of alcohol per week. She reports that she does not use illicit drugs.  Allergies  Allergen Reactions  . Shellfish Allergy Anaphylaxis  . Reglan [Metoclopramide] Other (See Comments)    oculogyral crisis  . Wheat Bran Other (See Comments)    Pt has Celiac disease  . Sulfa Antibiotics Rash  . Tartrazine Rash    Family History  Problem Relation Age of Onset  . Hypertension Mother      Prior to Admission medications   Medication Sig Start Date End Date Taking? Authorizing Provider  clonazePAM (KLONOPIN) 1 MG tablet Take 1 mg by mouth 2 (two) times daily as needed for anxiety.    Yes Historical Provider, MD  diltiazem (DILACOR XR) 180 MG 24 hr capsule Take 180 mg by mouth every morning.    Yes Historical Provider, MD  DULoxetine (CYMBALTA) 60 MG capsule Take 60 mg by mouth daily.   Yes Historical Provider, MD  hydrocortisone (CORTEF) 5 MG tablet Take 5-10 mg by mouth 2 (two) times daily. Take 29m in the morning and 535min the afternoon   Yes Historical Provider, MD  Lurasidone HCl (LATUDA) 20 MG TABS Take 20 mg by mouth every evening.    Yes Historical Provider, MD  metoprolol (LOPRESSOR) 50 MG tablet Take 75 mg by mouth 2 (two) times daily.   Yes Historical Provider, MD  montelukast (SINGULAIR) 10 MG tablet Take 10 mg by mouth at bedtime.   Yes Historical Provider, MD  ondansetron (ZOFRAN-ODT) 4 MG disintegrating tablet Take 4-8 mg by mouth every 8 (eight) hours as needed for nausea or vomiting.   Yes Historical Provider, MD  zolpidem (AMBIEN) 10 MG tablet Take 10 mg by mouth at bedtime.   Yes Historical Provider, MD  albuterol (PROVENTIL HFA;VENTOLIN HFA) 108 (90 BASE) MCG/ACT inhaler Inhale 2 puffs into the lungs every 6 (six) hours as needed for wheezing or shortness of breath. For shortness of breath    Historical Provider, MD  EPINEPHrine (EPIPEN) 0.3 mg/0.3 mL SOAJ injection Inject  0.3 mg into the muscle once as needed (severe allergic reaction).    Historical Provider, MD  hydrocortisone sodium succinate (SOLU-CORTEF) 100 mg/2 mL injection Inject 100 mg in the muscle over 30 seconds if you cannot take hydrocortisone by mouth 10/01/13   CrPhilemon KingdomMD   Physical Exam: Filed Vitals:   11/23/13 1600 11/23/13 1615 11/23/13 1631 11/23/13 1645  BP: 140/84  131/85 98/60  Pulse: 102 106  105  Temp:   98.4 F (36.9 C) 99 F (37.2 C)  TempSrc:    Oral  Resp: 23 22  20   Height:    5' 9"  (1.753 m)  Weight:    98.884 kg (218 lb)  SpO2: 100% 99%  100%    Physical Exam  Constitutional: Appears well-developed and well-nourished. No distress.  HENT: Normocephalic. External right and left ear normal. Oropharynx is clear and moist.  Eyes: Conjunctivae and EOM are normal. PERRLA, no scleral icterus.  Neck: Normal ROM. Neck supple. No JVD. No tracheal deviation. No thyromegaly.  CVS: RRR, S1/S2 +, no murmurs, no gallops, no carotid bruit.  Pulmonary: Effort and breath sounds normal, no stridor, rhonchi, wheezes, rales.  Abdominal: Soft. BS +,  no distension, tenderness across mid abdomen , no rebound or guarding.  Musculoskeletal: Normal range of motion.  No edema and no tenderness.  Lymphadenopathy: No lymphadenopathy noted, cervical, inguinal. Neuro: Alert. Normal reflexes, muscle tone coordination. No cranial nerve deficit. Skin: Skin is warm and dry. No rash noted. Not diaphoretic. No erythema. No pallor.  Psychiatric: Normal mood and affect. Behavior, judgment, thought content normal.   Labs on Admission:  Basic Metabolic Panel:  Recent Labs Lab 11/23/13 0156 11/23/13 1200 11/23/13 1704  NA 136*  --  134*  K 3.8  --  3.4*  CL 98  --  99  CO2 21  --  22  GLUCOSE 91  --  97  BUN 14  --  6  CREATININE 0.79  --  0.64  CALCIUM 8.7  --  7.1*  MG  --  1.4*  --   PHOS  --  2.5  --    Liver Function Tests:  Recent Labs Lab 11/23/13 0156 11/23/13 1704  AST  17 17  ALT 21 17  ALKPHOS 85 56  BILITOT 0.3 0.3  PROT 7.3 6.0  ALBUMIN 3.8 2.8*   No results found for this basename: LIPASE, AMYLASE,  in the last 168 hours No results found for this basename: AMMONIA,  in the last 168 hours CBC:  Recent Labs Lab 11/23/13 0156 11/23/13 1704  WBC 5.8 6.6  NEUTROABS 4.7 5.6  HGB 13.7 11.5*  HCT 40.9 34.2*  MCV 101.0* 100.0  PLT 178 189   Cardiac Enzymes:  Recent Labs Lab 11/23/13 1704  TROPONINI <0.30   BNP: No components found with this basename: POCBNP,  CBG: No results found for this basename: GLUCAP,  in the last 168 hours  If 7PM-7AM, please contact night-coverage [email or web address] Password [facility] 11/23/2013, 6:26 PM

## 2013-11-23 NOTE — Telephone Encounter (Signed)
Late entry: Called by pt ~4:15 pm today that she is in the hospital ready to be admitted. She presented after several episodes of N/V/D and fever, since she could not keep anything down, including her hydrocortisone tablets. In the ED, since she appeared very dehydrated, she was given 4L fluids and was prepared for admission. She was given 100 mg hydrocortisone iv and the plan is to continue with po hydrocortisone 10 mg in am and 5 mg in pm (home dose). She still could not take po when I talked to her. I am trying to get in touch with the attending dr. I would recommend that pt gets iv steroids (would give 50 mg q8h) until her fever is gone and she can take po and then switch to 20 mg in am and 10 mg in pm until discharge. She can be discharged on home dose.

## 2013-11-23 NOTE — ED Provider Notes (Signed)
6:15 AM = [facility] PA-C.  Continue to evaluate patient with re-checks.  Likely admit if condition not improving.    Rechecks  7:00 AM = Patient nauseated.  Re-ordering phenergan 12.5 mg.   7:30 AM = Spoke with patient.  She is not feeling better. Still nauseated.  HR 115.  Spoke with patient about admission and she is in agreement.     Filed Vitals:   11/23/13 0700 11/23/13 0730 11/23/13 0745 11/23/13 0746  BP: 121/68 106/52    Pulse: 119 117 114   Temp:    99.5 F (37.5 C)  TempSrc:    Oral  Resp: 24 23 19    SpO2: 99% 98% 96%     Consults  8:05 AM = Spoke with Dr. Elisabeth Pigeonevine who states to admit under med-surg.  Will come to evaluate patient.     Patient will be admitted for further evaluation and management of her nausea vomiting and diarrhea, dehydration, fever and tachycardia. Patient had a low-grade fever of 100.6 which reduced in the emergency department with Tylenol. She also had tachycardia which improved with IV fluids however is still present in the 100-110's.  Tachycardia may also be due to metoprolol not being about to be held down last night due to emesis and or fever.  Metoprolol given in ED.  Patient had no improvement in her condition with IV fluids (x 3L), Zofran, Phenergan, solu cortef, and GI cocktail.  Patient in agreement with admission and plan.       Final impressions: 1. Nausea vomiting and diarrhea   2. Dehydration   3. Fever   4. Tachycardia       Greer EeJessica Katlin Valin Massie PA-C         Jillyn LedgerJessica K Jakiera Ehler, PA-C 11/23/13 [id number]

## 2013-11-23 NOTE — ED Provider Notes (Signed)
Medical screening examination/treatment/procedure(s) were performed by non-physician practitioner and as supervising physician I was immediately available for consultation/collaboration.  Dynastee Brummell, MD 11/23/13 0641 

## 2013-11-23 NOTE — ED Provider Notes (Signed)
CSN: [id number]     Arrival date & time 11/22/13  2349 History   First MD Initiated Contact with Patient 11/23/13 [phone]     Chief Complaint  Patient presents with  . Emesis  . Diarrhea   HPI  History provided by the patient. Patient is a 33 year old female with history of von Willebrand disease, anemia, pods, asthma, celiac disease, anxiety and depression who presents with complaints of acute onset nausea, vomiting and diarrhea. Patient states that she started to feel slightly nauseous yesterday evening which was suddenly followed by acute nausea and vomiting around 8:30 yesterday evening. She reports having multiple episodes of vomiting which was then followed by watery diarrhea. Since that time she has had almost 18 episodes of nausea vomiting or watery diarrhea. She denies having any blood or mucus in her stool. She does report normally taking her evening doses of metoprolol for her pots and elevated heart rate but was not able to take this due to vomiting. She also feels slightly lightheaded and weak from all the vomiting and diarrhea. She denies any known sick contacts. Denies any associated fever but does report some chills. No other aggravating or alleviating factors. No other associated symptoms. Denies any significant abdominal pains.   Past Medical History  Diagnosis Date  . Von Willebrand disease     bruising easy  . Anemia 11/26/2011  . POTS (postural orthostatic tachycardia syndrome) 11/26/2011  . POTS (postural orthostatic tachycardia syndrome)   . Asthma   . Anxiety   . Depression   . H/O hiatal hernia   . Celiac and mesenteric artery injury   . Celiac and mesenteric artery injury    Past Surgical History  Procedure Laterality Date  . Abdominal hysterectomy    . Laparoscopic endometriosis fulguration    . Roux-en-y procedure    . Cholecystectomy    . Anterior cervical decomp/discectomy fusion N/A 03/31/2013    Procedure: ANTERIOR CERVICAL DECOMPRESSION/DISCECTOMY FUSION 1  LEVEL Cervical five-six;  Surgeon: Reinaldo Meekerandy O Kritzer, MD;  Location: [facility];  Service: Neurosurgery;  Laterality: N/A;   Family History  Problem Relation Age of Onset  . Hypertension Mother    History  Substance Use Topics  . Smoking status: Never Smoker   . Smokeless tobacco: Never Used  . Alcohol Use: 3.0 oz/week    5 Glasses of wine per week   OB History   Grav Para Term Preterm Abortions TAB SAB Ect Mult Living                 Review of Systems  Constitutional: Positive for chills. Negative for fever.  Respiratory: Negative for cough and shortness of breath.   Gastrointestinal: Positive for nausea, vomiting and diarrhea. Negative for abdominal pain.  All other systems reviewed and are negative.    Allergies  Shellfish allergy; Reglan; Wheat bran; Sulfa antibiotics; and Tartrazine  Home Medications   Current Outpatient Rx  Name  Route  Sig  Dispense  Refill  . clonazePAM (KLONOPIN) 1 MG tablet   Oral   Take 1 mg by mouth 2 (two) times daily as needed for anxiety.          Marland Kitchen. diltiazem (DILACOR XR) 180 MG 24 hr capsule   Oral   Take 180 mg by mouth every morning.          . DULoxetine (CYMBALTA) 60 MG capsule   Oral   Take 60 mg by mouth daily.         .Marland Kitchen  hydrocortisone (CORTEF) 5 MG tablet   Oral   Take 5-10 mg by mouth 2 (two) times daily. Take 10mg  in the morning and 5mg  in the afternoon         . Lurasidone HCl (LATUDA) 20 MG TABS   Oral   Take 20 mg by mouth every evening.          . metoprolol (LOPRESSOR) 50 MG tablet   Oral   Take 75 mg by mouth 2 (two) times daily.         . montelukast (SINGULAIR) 10 MG tablet   Oral   Take 10 mg by mouth at bedtime.         . ondansetron (ZOFRAN-ODT) 4 MG disintegrating tablet   Oral   Take 4-8 mg by mouth every 8 (eight) hours as needed for nausea or vomiting.         Marland Kitchen zolpidem (AMBIEN) 10 MG tablet   Oral   Take 10 mg by mouth at bedtime.         Marland Kitchen albuterol (PROVENTIL  HFA;VENTOLIN HFA) 108 (90 BASE) MCG/ACT inhaler   Inhalation   Inhale 2 puffs into the lungs every 6 (six) hours as needed for wheezing or shortness of breath. For shortness of breath         . EPINEPHrine (EPIPEN) 0.3 mg/0.3 mL SOAJ injection   Intramuscular   Inject 0.3 mg into the muscle once as needed (severe allergic reaction).         . hydrocortisone sodium succinate (SOLU-CORTEF) 100 mg/2 mL injection      Inject 100 mg in the muscle over 30 seconds if you cannot take hydrocortisone by mouth   5 each   prn    BP 121/74  Pulse 116  Temp(Src) 98.3 F (36.8 C) (Oral)  Resp 18  SpO2 99% Physical Exam  Nursing note and vitals reviewed. Constitutional: She is oriented to person, place, and time. She appears well-developed and well-nourished. No distress.  HENT:  Head: Normocephalic.  Mouth/Throat: Oropharynx is clear and moist.  Cardiovascular: Regular rhythm.  Tachycardia present.   Pulmonary/Chest: Effort normal and breath sounds normal. No respiratory distress. She has no wheezes. She has no rales.  Abdominal: Soft. There is no tenderness. There is no rebound and no guarding.  Obese  Neurological: She is alert and oriented to person, place, and time.  Skin: Skin is warm and dry. No rash noted.  Psychiatric: She has a normal mood and affect. Her behavior is normal.    ED Course  Procedures   DIAGNOSTIC STUDIES: Oxygen Saturation is 99% on room air.    COORDINATION OF CARE:  Nursing notes reviewed. Vital signs reviewed. Initial pt interview and examination performed.   2:40 AM patient seen and evaluated. Patient resting appears comfortable in no acute distress at this time. She is tachycardic. She reports some epigastric discomfort. She continues to have some nausea. No other complaints currently. Discussed work up plan with pt at bedside, which includes lab testing. Pt agrees with plan.  Labs are unremarkable. Patient continues to be slightly tachycardic. IV  fluids are going slowly. She has only received half a liter. Patient also was unable to take her evening metoprolol which helps control her heart rate. We will give her normal dose of metoprolol 75 mg. Patient does report some improvement of her nausea. She's not had any vomiting. Plan to continue to assess patient for improvement.  Patient continues to feel well. She does have tachycardia  after first liter of fluids. This has been going very slowly. She did not have significant improvement of her heart rate after her normal nightly dose of metoprolol. EKG was ordered and reviewed with attending physician. Sinus tachycardia without any other emergent findings. Discussed with the incision continued treatment plan. We'll plan to continue IV fluids and watch for improvement of heart rate.  Patient discussed in sign out with Coral Ceo PA-C.  She will continue to monitor patient's condition and heart rate.   Treatment plan initiated: Medications  metoprolol tartrate (LOPRESSOR) tablet 75 mg (not administered)  promethazine (PHENERGAN) injection 12.5 mg (not administered)  ondansetron (ZOFRAN) injection 4 mg (4 mg Intravenous Given 11/23/13 0206)  sodium chloride 0.9 % bolus 1,000 mL (1,000 mLs Intravenous New Bag/Given 11/23/13 0225)  promethazine (PHENERGAN) injection 12.5 mg (12.5 mg Intravenous Given 11/23/13 0256)  morphine 4 MG/ML injection 4 mg (4 mg Intravenous Given 11/23/13 0258)   Results for orders placed during the hospital encounter of 11/23/13  COMPREHENSIVE METABOLIC PANEL      Result Value Range   Sodium 136 (*) 137 - 147 mEq/L   Potassium 3.8  3.7 - 5.3 mEq/L   Chloride 98  96 - 112 mEq/L   CO2 21  19 - 32 mEq/L   Glucose, Bld 91  70 - 99 mg/dL   BUN 14  6 - 23 mg/dL   Creatinine, Ser 1.61  0.50 - 1.10 mg/dL   Calcium 8.7  8.4 - 09.6 mg/dL   Total Protein 7.3  6.0 - 8.3 g/dL   Albumin 3.8  3.5 - 5.2 g/dL   AST 17  0 - 37 U/L   ALT 21  0 - 35 U/L   Alkaline Phosphatase 85   39 - 117 U/L   Total Bilirubin 0.3  0.3 - 1.2 mg/dL   GFR calc non Af Amer >90  >90 mL/min   GFR calc Af Amer >90  >90 mL/min  CBC WITH DIFFERENTIAL      Result Value Range   WBC 5.8  4.0 - 10.5 K/uL   RBC 4.05  3.87 - 5.11 MIL/uL   Hemoglobin 13.7  12.0 - 15.0 g/dL   HCT 04.5  40.9 - 81.1 %   MCV 101.0 (*) 78.0 - 100.0 fL   MCH 33.8  26.0 - 34.0 pg   MCHC 33.5  30.0 - 36.0 g/dL   RDW 91.4  78.2 - 95.6 %   Platelets 178  150 - 400 K/uL   Neutrophils Relative % 82 (*) 43 - 77 %   Neutro Abs 4.7  1.7 - 7.7 K/uL   Lymphocytes Relative 14  12 - 46 %   Lymphs Abs 0.8  0.7 - 4.0 K/uL   Monocytes Relative 3  3 - 12 %   Monocytes Absolute 0.2  0.1 - 1.0 K/uL   Eosinophils Relative 0  0 - 5 %   Eosinophils Absolute 0.0  0.0 - 0.7 K/uL   Basophils Relative 0  0 - 1 %   Basophils Absolute 0.0  0.0 - 0.1 K/uL      MDM   1. Viral gastroenteritis   2. Nausea vomiting and diarrhea   3. Dehydration        Angus Seller, PA-C 11/23/13 [phone]

## 2013-11-23 NOTE — Progress Notes (Signed)
Utilization Review completed.  Lebaron Bautch RN CM  

## 2013-11-24 DIAGNOSIS — E860 Dehydration: Secondary | ICD-10-CM

## 2013-11-24 LAB — COMPREHENSIVE METABOLIC PANEL
ALT: 25 U/L (ref 0–35)
AST: 27 U/L (ref 0–37)
Albumin: 3 g/dL — ABNORMAL LOW (ref 3.5–5.2)
Alkaline Phosphatase: 62 U/L (ref 39–117)
BUN: 4 mg/dL — AB (ref 6–23)
CO2: 23 mEq/L (ref 19–32)
CREATININE: 0.67 mg/dL (ref 0.50–1.10)
Calcium: 7.5 mg/dL — ABNORMAL LOW (ref 8.4–10.5)
Chloride: 107 mEq/L (ref 96–112)
GFR calc non Af Amer: >90 mL/min (ref >90)
Glucose, Bld: 115 mg/dL — ABNORMAL HIGH (ref 70–99)
POTASSIUM: 3.8 meq/L (ref 3.7–5.3)
Sodium: 142 mEq/L (ref 137–147)
TOTAL PROTEIN: 6.5 g/dL (ref 6.0–8.3)

## 2013-11-24 LAB — CBC
HEMATOCRIT: 38.2 % (ref 36.0–46.0)
Hemoglobin: 12.7 g/dL (ref 12.0–15.0)
MCH: 33.3 pg (ref 26.0–34.0)
MCHC: 33.2 g/dL (ref 30.0–36.0)
MCV: 100.3 fL — AB (ref 78.0–100.0)
Platelets: 208 10*3/uL (ref 150–400)
RBC: 3.81 MIL/uL — ABNORMAL LOW (ref 3.87–5.11)
RDW: 12.5 % (ref 11.5–15.5)
WBC: 6.3 10*3/uL (ref 4.0–10.5)

## 2013-11-24 LAB — TROPONIN I: Troponin I: <0.3 ng/mL (ref <0.30)

## 2013-11-24 LAB — PRO B NATRIURETIC PEPTIDE: PRO B NATRI PEPTIDE: 310.5 pg/mL — AB (ref 0–125)

## 2013-11-24 LAB — CLOSTRIDIUM DIFFICILE BY PCR: Toxigenic C. Difficile by PCR: NEGATIVE

## 2013-11-24 MED ORDER — IBUPROFEN 600 MG PO TABS
600.0000 mg | ORAL_TABLET | Freq: Four times a day (QID) | ORAL | Status: DC | PRN
  Administered 2013-11-24: 600 mg via ORAL
  Filled 2013-11-24: qty 1

## 2013-11-24 NOTE — Progress Notes (Addendum)
[facility] PROGRESS NOTE  [name] Ellerson ZOX:[id number] DOB: Mar 31, 1981 DOA: 11/23/2013 PCP: Cain SaupeFULP, CAMMIE, MD  Brief narrative: 33 year old female with past medical history of von Willebrand disease, anemia, celiac's disease, asthma, cushing's disease who [facility]2013 with ongoing nausea, vomiting, diarrhea started 1 day prior to this admission.  In ED, BP was 101/51, HR 115, Tmax 100.6 F and oxygen saturation 95% on room air. Blood work was essentially unremarkable on the admission except for magnesium which was 1.4 and was supplemented.   Assessment and Plan:   Principal Problem:  Nausea vomiting and diarrhea  - likely viral gastroenteritis  - C.diff negative on this admission; follow up on stool culture, stool ova and parasites, stool pathogen level; also TTG IgG and IgA - spoke with GI on call Dr. Ewing SchleinMagod who recommended obtaining above panel. If pt does not get better then will call GI for an official consult  - still with some nausea, diarrhea - continue supportive care with IV fluids, antiemetics   Active Problems:  Celiac disease  - stable; obtain TTG IgG and IgA Asthma, chronic  - stable, continue current home dose of inhaler  - Continue Singulair Iatrogenic Cushing's syndrome  - cortisol level normal on admission  - on solu-cortef 50 mg Q 8 hours per endo recomendation  Anemia of chronic disease  - secondary to celiac disease  Hypokalemia, Hypomagneseima  - repleted  Hypertension - Continue Cardizem 180 mg daily and metoprolol 75 mg twice daily Hyponatremia  - likely dehydration  - Resolved with IV fluids   Code Status: Full  Family Communication: mother at bedside  Disposition Plan: home when stable   Manson PasseyEVINE, Derico Mitton, MD  [facility] Pager [phone]  If 7PM-7AM, please contact night-coverage [email or web address] Password [email or web address] 11/24/2013, 12:14 PM   LOS: 1 day   Consultants:  GI  Endo (Dr. Elvera LennoxGherghe)  Procedures:  None    Antibiotics:  None   HPI/Subjective: Still has nausea, diarrhea.   Objective: Filed Vitals:   11/23/13 1645 11/23/13 2052 11/24/13 0255 11/24/13 0715  BP: 98/60 112/71 93/55 102/69  Pulse: 105 112 62 63  Temp: 99 F (37.2 C) 100.6 F (38.1 C) 97.4 F (36.3 C) 97.4 F (36.3 C)  TempSrc: Oral Axillary Oral Oral  Resp: 20 20 20 20   Height: 5\' 9"  (1.753 m)     Weight: 98.884 kg (218 lb)     SpO2: 100% 97% 98% 98%    Intake/Output Summary (Last 24 hours) at 11/24/13 1214 Last data filed at 11/24/13 0600  Gross per 24 hour  Intake 1298.75 ml  Output    700 ml  Net 598.75 ml    Exam:   General:  Pt is alert, follows commands appropriately, not in acute distress  Cardiovascular: Regular rate and rhythm, S1/S2 appreciated   Respiratory: Clear to auscultation bilaterally, no wheezing, no crackles, no rhonchi  Abdomen: Soft, tender in mid abdomen, non distended, bowel sounds present, no guarding  Extremities: No edema, pulses DP and PT palpable bilaterally  Neuro: Grossly nonfocal  Data Reviewed: Basic Metabolic Panel:  Recent Labs Lab 11/23/13 0156 11/23/13 1200 11/23/13 1704 11/24/13 0345  NA 136*  --  134* 142  K 3.8  --  3.4* 3.8  CL 98  --  99 107  CO2 21  --  22 23  GLUCOSE 91  --  97 115*  BUN 14  --  6 4*  CREATININE 0.79  --  0.64 0.67  CALCIUM 8.7  --  7.1* 7.5*  MG  --  1.4*  --   --   PHOS  --  2.5  --   --    Liver Function Tests:  Recent Labs Lab 11/23/13 0156 11/23/13 1704 11/24/13 0345  AST 17 17 27   ALT 21 17 25   ALKPHOS 85 56 62  BILITOT 0.3 0.3 <0.2*  PROT 7.3 6.0 6.5  ALBUMIN 3.8 2.8* 3.0*   No results found for this basename: LIPASE, AMYLASE,  in the last 168 hours No results found for this basename: AMMONIA,  in the last 168 hours CBC:  Recent Labs Lab 11/23/13 0156 11/23/13 1704 11/24/13 0345  WBC 5.8 6.6 6.3  NEUTROABS 4.7 5.6  --   HGB 13.7 11.5* 12.7  HCT 40.9 34.2* 38.2  MCV 101.0* 100.0 100.3*  PLT 178  189 208   Cardiac Enzymes:  Recent Labs Lab 11/23/13 1704 11/23/13 2040 11/24/13 0345  TROPONINI <0.30 <0.30 <0.30   BNP: No components found with this basename: POCBNP,  CBG: No results found for this basename: GLUCAP,  in the last 168 hours  CLOSTRIDIUM DIFFICILE BY PCR     Status: None   Collection Time    11/23/13  5:41 PM      Result Value Range Status   C difficile by pcr NEGATIVE  NEGATIVE Final   Comment: Performed at [facility]     Studies: No results found.  Scheduled Meds: . diltiazem  180 mg Oral q morning - 10a  . DULoxetine  60 mg Oral Daily  . hydrocortisone sod succinate (SOLU-CORTEF) inj  50 mg Intravenous Q8H  . Lurasidone HCl  20 mg Oral QPM  . metoprolol  75 mg Oral BID  . montelukast  10 mg Oral QHS  . zolpidem  10 mg Oral QHS   Continuous Infusions: . sodium chloride 75 mL/hr at 11/23/13 1603

## 2013-11-24 NOTE — ED Provider Notes (Signed)
Medical screening examination/treatment/procedure(s) were conducted as a shared visit with non-physician practitioner(s) and myself.  I personally evaluated the patient during the encounter.  EKG Interpretation    Date/Time:  Monday November 23 2013 05:09:53 EST Ventricular Rate:  122 PR Interval:  148 QRS Duration: 86 QT Interval:  309 QTC Calculation: 440 R Axis:   49 Text Interpretation:  Sinus tachycardia Non-specific ST-t changes Abnormal ECG Confirmed by Besan Ketchem  MD, Jasey Cortez ([id number]) on 11/23/2013 5:33:00 AM           Patient evaluated for nausea vomiting diarrhea and low-grade fever. She has a history of Cushing's disease. On exam she is tachycardic in the 110s after a liter of fluid. She has been given IV steroids and planned on the second day of IV fluids, continue antiemetics. If unable to tolerate by mouth fluids/  remains tachycardic, will admit.   Sunnie NielsenBrian Ashima Shrake, MD 11/24/13 [phone]

## 2013-11-25 DIAGNOSIS — R112 Nausea with vomiting, unspecified: Secondary | ICD-10-CM

## 2013-11-25 DIAGNOSIS — R197 Diarrhea, unspecified: Secondary | ICD-10-CM

## 2013-11-25 DIAGNOSIS — K900 Celiac disease: Secondary | ICD-10-CM

## 2013-11-25 DIAGNOSIS — E871 Hypo-osmolality and hyponatremia: Secondary | ICD-10-CM

## 2013-11-25 DIAGNOSIS — E876 Hypokalemia: Secondary | ICD-10-CM

## 2013-11-25 LAB — OVA AND PARASITE EXAMINATION: Ova and parasites: NONE SEEN

## 2013-11-25 LAB — GI PATHOGEN PANEL BY PCR, STOOL
C DIFFICILE TOXIN A/B: NEGATIVE
CAMPYLOBACTER BY PCR: NEGATIVE
CRYPTOSPORIDIUM BY PCR: NEGATIVE
E COLI (ETEC) LT/ST: NEGATIVE
E COLI 0157 BY PCR: NEGATIVE
E coli (STEC): NEGATIVE
G lamblia by PCR: NEGATIVE
Norovirus GI/GII: POSITIVE
Rotavirus A by PCR: NEGATIVE
Salmonella by PCR: NEGATIVE
Shigella by PCR: NEGATIVE

## 2013-11-25 LAB — TISSUE TRANSGLUTAMINASE, IGG: Tissue Transglut Ab: 6.3 U/mL (ref <20)

## 2013-11-25 LAB — TISSUE TRANSGLUTAMINASE, IGA: Tissue Transglutaminase Ab, IgA: 1.7 U/mL (ref <20)

## 2013-11-25 LAB — GLUCOSE, CAPILLARY
GLUCOSE-CAPILLARY: 113 mg/dL — AB (ref 70–99)
GLUCOSE-CAPILLARY: 113 mg/dL — AB (ref 70–99)

## 2013-11-25 MED ORDER — HYDROCORTISONE SOD SUCCINATE 100 MG IJ SOLR
50.0000 mg | Freq: Two times a day (BID) | INTRAMUSCULAR | Status: DC
  Administered 2013-11-25 – 2013-11-26 (×2): 50 mg via INTRAVENOUS
  Filled 2013-11-25 (×4): qty 1

## 2013-11-25 MED ORDER — PANTOPRAZOLE SODIUM 40 MG PO TBEC
40.0000 mg | DELAYED_RELEASE_TABLET | Freq: Every day | ORAL | Status: DC
  Administered 2013-11-25 – 2013-11-26 (×2): 40 mg via ORAL
  Filled 2013-11-25 (×3): qty 1

## 2013-11-25 MED ORDER — ONDANSETRON HCL 4 MG/2ML IJ SOLN
4.0000 mg | Freq: Four times a day (QID) | INTRAMUSCULAR | Status: DC | PRN
  Administered 2013-11-25: 4 mg via INTRAVENOUS
  Filled 2013-11-25: qty 2

## 2013-11-25 NOTE — Progress Notes (Signed)
[facility] PROGRESS NOTE   Kaitlyn GuntherKendall Bennett Johnson [id number] DOB: 28-Sep-1981 DOA: 11/23/2013 PCP: Cain SaupeFULP, CAMMIE, MD  Brief narrative: Kaitlyn GuntherKendall Bennett Karbowski is an 33 y.o. female with a PMH of POTS managed with Cardizem and metoprolol, Cushing syndrome managed with Cortef and celiac disease who was admitted on 11/23/13 with nausea, vomiting and diarrhea as well as fever.  Assessment/Plan: Principal Problem:   Nausea vomiting and diarrhea in a patient with celiac disease May be from viral gastroenteritis. C. difficile toxin studies negative. Stool cultures, ova and parasite exam, GI pathogen panel, and tissue transglutaminase studies all pending. Now afebrile. Active Problems:   POTS (postural orthostatic tachycardia syndrome) Continue Cardizem and metoprolol as blood pressure tolerates.   History of depression/anxiety Continue Klonopin, Cymbalta, and Latuda.   Asthma, chronic Continue Singulair and albuterol as needed.   Iatrogenic Cushing's syndrome Currently on stress dose steroids, wean as tolerated.   Anemia of chronic disease Hemoglobin stable at 12.7.   Hypokalemia / hypomagnesemia Monitor and replace electrolytes as needed.   Hyponatremia  Resolved.  Code Status: Full. Family Communication: Mother at the bedside. Disposition Plan: Home when stable.   IV access:  Peripheral IV  Medical Consultants:  None  Other Consultants:  None  Anti-infectives:  None  HPI/Subjective: Kaitlyn GuntherKendall Bennett Shankar tells me she continues to have 8 loose stools a day. Had an episode of nausea and vomiting yesterday, but none so far today. Has midepigastric abdominal pain. No dyspnea or cough. No sick contacts.  Objective: Filed Vitals:   11/24/13 0715 11/24/13 1358 11/24/13 2114 11/25/13 0428  BP: 102/69 113/67 120/69 120/71  Pulse: 63 72 73 69  Temp: 97.4 F (36.3 C) 97.2 F (36.2 C) 97 F (36.1 C) 97.5 F (36.4 C)  TempSrc: Oral Axillary Axillary Axillary   Resp: 20 16 18 18   Height:      Weight:      SpO2: 98% 98% 100% 99%    Intake/Output Summary (Last 24 hours) at 11/25/13 0921 Last data filed at 11/25/13 0745  Gross per 24 hour  Intake   1375 ml  Output      0 ml  Net   1375 ml    Exam: Gen:  NAD Cardiovascular:  RRR, No M/R/G Respiratory:  Lungs CTAB Gastrointestinal:  Abdomen soft, NT/ND, + BS Extremities:  No C/E/C  Data Reviewed: Basic Metabolic Panel:  Recent Labs Lab 11/23/13 0156 11/23/13 1200 11/23/13 1704 11/24/13 0345  NA 136*  --  134* 142  K 3.8  --  3.4* 3.8  CL 98  --  99 107  CO2 21  --  22 23  GLUCOSE 91  --  97 115*  BUN 14  --  6 4*  CREATININE 0.79  --  0.64 0.67  CALCIUM 8.7  --  7.1* 7.5*  MG  --  1.4*  --   --   PHOS  --  2.5  --   --    GFR Estimated Creatinine Clearance: 128.7 ml/min (by C-G formula based on Cr of 0.67). Liver Function Tests:  Recent Labs Lab 11/23/13 0156 11/23/13 1704 11/24/13 0345  AST 17 17 27   ALT 21 17 25   ALKPHOS 85 56 62  BILITOT 0.3 0.3 <0.2*  PROT 7.3 6.0 6.5  ALBUMIN 3.8 2.8* 3.0*   Coagulation profile  Recent Labs Lab 11/23/13 1200  INR 0.96    CBC:  Recent Labs Lab 11/23/13 0156 11/23/13 1704 11/24/13 0345  WBC 5.8 6.6 6.3  NEUTROABS  4.7 5.6  --   HGB 13.7 11.5* 12.7  HCT 40.9 34.2* 38.2  MCV 101.0* 100.0 100.3*  PLT 178 189 208   Cardiac Enzymes:  Recent Labs Lab 11/23/13 1704 11/23/13 2040 11/24/13 0345  TROPONINI <0.30 <0.30 <0.30   BNP (last 3 results)  Recent Labs  11/23/13 1704 11/23/13 2040 11/24/13 0345  PROBNP 356.0* 408.5* 310.5*   CBG:  Recent Labs Lab 11/25/13 0800  GLUCAP 113*   Microbiology Recent Results (from the past 240 hour(s))  CLOSTRIDIUM DIFFICILE BY PCR     Status: None   Collection Time    11/23/13  5:41 PM      Result Value Range Status   C difficile by pcr NEGATIVE  NEGATIVE Final   Comment: Performed at [facility]     Procedures and Diagnostic Studies: No  results found.  Scheduled Meds: . diltiazem  180 mg Oral q morning - 10a  . DULoxetine  60 mg Oral Daily  . hydrocortisone sod succinate (SOLU-CORTEF) inj  50 mg Intravenous Q8H  . Lurasidone HCl  20 mg Oral QPM  . metoprolol  75 mg Oral BID  . montelukast  10 mg Oral QHS  . zolpidem  10 mg Oral QHS   Continuous Infusions: . sodium chloride 75 mL/hr at 11/25/13 0411    Time spent: 25 minutes.   LOS: 2 days   Valeta Paz  [facility] Pager [phone].   *Please note that the hospitalists switch teams on Wednesdays. Please call the flow manager at [phone] if you are having difficulty reaching the hospitalist taking care of this patient as she can update you and provide the most up-to-date pager number of provider caring for the patient. If 8PM-8AM, please contact night-coverage at [email or web address], password [facility]  11/25/2013, 9:21 AM     Information printed out, explained, and given to the patient:  In an effort to keep you and your family informed about your hospital stay, I am providing you with this information sheet. If you or your family have any questions, please do not hesitate to have the nursing staff page me to set up a meeting time.  Kaitlyn Johnson 11/25/2013 2 (Number of days in the hospital)  Treatment team:  Dr. Hillery Aldo, Hospitalist (Internist)   Active Treatment Issues with Plan: Principal Problem:   Nausea vomiting and diarrhea in a patient with celiac disease May be from viral gastroenteritis. C. difficile toxin studies negative. Stool cultures, ova and parasite exam, GI pathogen panel, and tissue transglutaminase studies all pending. Now afebrile x 36 hours. Active Problems:   History of depression/anxiety Continue Klonopin, Cymbalta, and Latuda.   Asthma, chronic Continue Singulair and albuterol as needed.   Iatrogenic Cushing's syndrome Currently on stress dose steroids.   Anemia of chronic disease Hemoglobin stable at 12.7.    Low potassium Resolved.   Low sodium Resolved.  Anticipated discharge date: Depends on symptom resolution.

## 2013-11-26 DIAGNOSIS — A0811 Acute gastroenteropathy due to Norwalk agent: Principal | ICD-10-CM

## 2013-11-26 LAB — BASIC METABOLIC PANEL
BUN: 3 mg/dL — AB (ref 6–23)
CO2: 25 mEq/L (ref 19–32)
Calcium: 8.2 mg/dL — ABNORMAL LOW (ref 8.4–10.5)
Chloride: 106 mEq/L (ref 96–112)
Creatinine, Ser: 0.58 mg/dL (ref 0.50–1.10)
GFR calc Af Amer: >90 mL/min (ref >90)
GLUCOSE: 105 mg/dL — AB (ref 70–99)
Potassium: 3.4 mEq/L — ABNORMAL LOW (ref 3.7–5.3)
Sodium: 139 mEq/L (ref 137–147)

## 2013-11-26 LAB — MAGNESIUM: Magnesium: 2 mg/dL (ref 1.5–2.5)

## 2013-11-26 MED ORDER — POTASSIUM CHLORIDE 10 MEQ/100ML IV SOLN
10.0000 meq | INTRAVENOUS | Status: AC
  Administered 2013-11-26 (×2): 10 meq via INTRAVENOUS
  Filled 2013-11-26 (×2): qty 100

## 2013-11-26 MED ORDER — POTASSIUM CHLORIDE CRYS ER 20 MEQ PO TBCR
40.0000 meq | EXTENDED_RELEASE_TABLET | Freq: Once | ORAL | Status: DC
  Filled 2013-11-26: qty 2

## 2013-11-26 NOTE — Discharge Summary (Signed)
Physician Discharge Summary  Kaitlyn Johnson ZOX:[id number] DOB: 1980-11-24 DOA: 11/23/2013  PCP: Cain Saupe, MD  Admit date: 11/23/2013 Discharge date: 11/26/2013  Recommendations for Outpatient Follow-up:  1. F/U with PCP as needed.  Discharge Diagnoses:  Principal Problem:    Gastroenteritis due to norovirus Active Problems:    Celiac disease    Asthma, chronic    Iatrogenic Cushing's syndrome    Nausea vomiting and diarrhea    Anemia of chronic disease    Hypokalemia    Hyponatremia    POTS (postural orthostatic tachycardia syndrome)    Hypomagnesemia   Discharge Condition: Improved.  Diet recommendation: Regular.  History of present illness:  Kaitlyn Johnson is an 33 y.o. female with a PMH of POTS managed with Cardizem and metoprolol, Cushing syndrome managed with Cortef and celiac disease who was admitted on 11/23/13 with nausea, vomiting and diarrhea as well as fever.  Hospital Course by problem:  Principal Problem:  Gastroenteritis due to no oral or with Nausea vomiting and diarrhea in a patient with celiac disease  GI pathogen panel positive for norovirus. C. difficile toxin studies negative. Stool cultures pending, ova and parasite exam negative and tissue transglutaminase studies within normal limits. Afebrile for 48 hours with gradual improvement in symptoms. Will discharge home with encouragement to maintain hydration. Active Problems:  POTS (postural orthostatic tachycardia syndrome)  Continue Cardizem and metoprolol as blood pressure tolerates.  History of depression/anxiety  Continue Klonopin, Cymbalta, and Latuda.  Asthma, chronic  Continue Singulair and albuterol as needed.  Iatrogenic Cushing's syndrome  Resume home dose of steroids at discharge. Stress dose steroids weaned. Anemia of chronic disease  Hemoglobin stable at 12.7.  Hypokalemia / hypomagnesemia  Provided with potassium and magnesium supplementation.  Hyponatremia   Resolved.  Procedures:  None.  Consultations:  None.  Discharge Exam: Filed Vitals:   11/26/13 0924  BP: 130/95  Pulse: 66  Temp:   Resp:    Filed Vitals:   11/25/13 1608 11/25/13 2151 11/26/13 0516 11/26/13 0924  BP: 116/72 122/75 119/75 130/95  Pulse: 77 101 62 66  Temp: 97.6 F (36.4 C) 98.2 F (36.8 C) 98.2 F (36.8 C)   TempSrc: Axillary Oral Oral   Resp: 18 16 15    Height:      Weight:      SpO2: 100% 96% 97%     Gen:  NAD Cardiovascular:  RRR, No M/R/G Respiratory: Lungs CTAB Gastrointestinal: Abdomen soft, NT/ND with normal active bowel sounds. Extremities: No C/E/C   Discharge Instructions  Discharge Orders   Future Orders Complete By Expires   Call MD for:  persistant nausea and vomiting  As directed    Call MD for:  severe uncontrolled pain  As directed    Call MD for:  temperature >100.4  As directed    Diet general  As directed    Discharge instructions  As directed    Comments:     [facility]  Lili Harts  11/26/2013  3 (Number of days in the hospital)  Treatment team:  Dr. Hillery Aldo, Hospitalist (Internist) Active Treatment Issues with Plan:  Principal Problem:  Nausea vomiting and diarrhea in a patient with celiac disease  Secondary to norovirus. C. difficile toxin studies negative. Ova and parasite exam negative, and tissue transglutaminase studies within normal limits. Now afebrile x 48 hours.  Stool cultures not finalized yet, but negative to date. Active Problems:  History of depression/anxiety  Continue Klonopin, Cymbalta, and Latuda.  Asthma,  chronic  Continue Singulair and albuterol as needed.  Iatrogenic Cushing's syndrome  Currently on stress dose steroids which we began to wean yesterday.  Resume home dose of steroids.  Anemia of chronic disease  Hemoglobin stable at 12.7.  Low potassium  Will replete prior to discharge.  Low sodium  Resolved.  Anticipated discharge date: Today.  Stay well  hydrated.  You were cared for by Dr. Hillery Aldo  (a hospitalist) during your hospital stay. If you have any questions about your discharge medications or the care you received while you were in the hospital after you are discharged, you can call the unit and ask to speak with the hospitalist on call if the hospitalist that took care of you is not available. Once you are discharged, your primary care physician will handle any further medical issues. Please note that NO REFILLS for any discharge medications will be authorized once you are discharged, as it is imperative that you return to your primary care physician (or establish a relationship with a primary care physician if you do not have one) for your aftercare needs so that they can reassess your need for medications and monitor your lab values.  Any outstanding tests can be reviewed by your PCP at your follow up visit.  It is also important to review any medicine changes with your PCP.  Please bring these d/c instructions with you to your next visit so your physician can review these changes with you.  If you do not have a primary care physician, you can call [phone] for a physician referral.  It is highly recommended that you obtain a PCP for hospital follow up.   Increase activity slowly  As directed        Medication List         albuterol 108 (90 BASE) MCG/ACT inhaler  Commonly known as:  PROVENTIL HFA;VENTOLIN HFA  Inhale 2 puffs into the lungs every 6 (six) hours as needed for wheezing or shortness of breath. For shortness of breath     clonazePAM 1 MG tablet  Commonly known as:  KLONOPIN  Take 1 mg by mouth 2 (two) times daily as needed for anxiety.     diltiazem 180 MG 24 hr capsule  Commonly known as:  DILACOR XR  Take 180 mg by mouth every morning.     DULoxetine 60 MG capsule  Commonly known as:  CYMBALTA  Take 60 mg by mouth daily.     EPIPEN 0.3 mg/0.3 mL Soaj injection  Generic drug:  EPINEPHrine  Inject 0.3 mg  into the muscle once as needed (severe allergic reaction).     hydrocortisone 5 MG tablet  Commonly known as:  CORTEF  Take 5-10 mg by mouth 2 (two) times daily. Take 10mg  in the morning and 5mg  in the afternoon     hydrocortisone sodium succinate 100 mg/2 mL injection  Commonly known as:  SOLU-CORTEF  Inject 100 mg in the muscle over 30 seconds if you cannot take hydrocortisone by mouth     LATUDA 20 MG Tabs  Generic drug:  Lurasidone HCl  Take 20 mg by mouth every evening.     metoprolol 50 MG tablet  Commonly known as:  LOPRESSOR  Take 75 mg by mouth 2 (two) times daily.     montelukast 10 MG tablet  Commonly known as:  SINGULAIR  Take 10 mg by mouth at bedtime.     ondansetron 4 MG disintegrating tablet  Commonly known as:  ZOFRAN-ODT  Take 4-8 mg by mouth every 8 (eight) hours as needed for nausea or vomiting.     zolpidem 10 MG tablet  Commonly known as:  AMBIEN  Take 10 mg by mouth at bedtime.          The results of significant diagnostics from this hospitalization (including imaging, microbiology, ancillary and laboratory) are listed below for reference.    Significant Diagnostic Studies: No results found.  Labs:  Basic Metabolic Panel:  Recent Labs Lab 11/23/13 0156 11/23/13 1200 11/23/13 1704 11/24/13 0345 11/26/13 0405  NA 136*  --  134* 142 139  K 3.8  --  3.4* 3.8 3.4*  CL 98  --  99 107 106  CO2 21  --  22 23 25   GLUCOSE 91  --  97 115* 105*  BUN 14  --  6 4* 3*  CREATININE 0.79  --  0.64 0.67 0.58  CALCIUM 8.7  --  7.1* 7.5* 8.2*  MG  --  1.4*  --   --  2.0  PHOS  --  2.5  --   --   --    GFR Estimated Creatinine Clearance: 128.7 ml/min (by C-G formula based on Cr of 0.58). Liver Function Tests:  Recent Labs Lab 11/23/13 0156 11/23/13 1704 11/24/13 0345  AST 17 17 27   ALT 21 17 25   ALKPHOS 85 56 62  BILITOT 0.3 0.3 <0.2*  PROT 7.3 6.0 6.5  ALBUMIN 3.8 2.8* 3.0*   Coagulation profile  Recent Labs Lab 11/23/13 1200  INR  0.96    CBC:  Recent Labs Lab 11/23/13 0156 11/23/13 1704 11/24/13 0345  WBC 5.8 6.6 6.3  NEUTROABS 4.7 5.6  --   HGB 13.7 11.5* 12.7  HCT 40.9 34.2* 38.2  MCV 101.0* 100.0 100.3*  PLT 178 189 208   Cardiac Enzymes:  Recent Labs Lab 11/23/13 1704 11/23/13 2040 11/24/13 0345  TROPONINI <0.30 <0.30 <0.30   CBG:  Recent Labs Lab 11/24/13 0844 11/25/13 0800  GLUCAP 113* 113*   Microbiology Recent Results (from the past 240 hour(s))  CLOSTRIDIUM DIFFICILE BY PCR     Status: None   Collection Time    11/23/13  5:41 PM      Result Value Range Status   C difficile by pcr NEGATIVE  NEGATIVE Final   Comment: Performed at [facility]  STOOL CULTURE     Status: None   Collection Time    11/24/13  3:39 PM      Result Value Range Status   Specimen Description STOOL   Final   Special Requests NONE   Final   Culture     Final   Value: NO SUSPICIOUS COLONIES, CONTINUING TO HOLD     Performed at [facility]   Report Status PENDING   Incomplete  OVA AND PARASITE EXAMINATION     Status: None   Collection Time    11/24/13  3:39 PM      Result Value Range Status   Specimen Description STOOL   Final   Special Requests NONE   Final   Ova and parasites     Final   Value: NO OVA OR PARASITES SEEN     Performed at [facility]   Report Status 11/25/2013 FINAL   Final    Time coordinating discharge: 35 minutes.  Signed:  Chett Taniguchi  Pager [phone] [facility] 11/26/2013, 10:17 AM

## 2013-11-26 NOTE — Progress Notes (Signed)
Patient d/c home. Stable.No n/v, diarrhea- Hulda Marinonna Clarie Camey RN

## 2013-11-28 LAB — STOOL CULTURE

## 2013-11-29 ENCOUNTER — Inpatient Hospital Stay (HOSPITAL_BASED_OUTPATIENT_CLINIC_OR_DEPARTMENT_OTHER)
Admission: EM | Admit: 2013-11-29 | Discharge: 2013-12-07 | DRG: 439 | Disposition: A | Discharge status: Home / Self Care | Payer: BC Managed Care – PPO | Attending: Internal Medicine | Admitting: Internal Medicine

## 2013-11-29 ENCOUNTER — Encounter (HOSPITAL_BASED_OUTPATIENT_CLINIC_OR_DEPARTMENT_OTHER): Payer: Self-pay | Admitting: Emergency Medicine

## 2013-11-29 DIAGNOSIS — R197 Diarrhea, unspecified: Secondary | ICD-10-CM | POA: Diagnosis present

## 2013-11-29 DIAGNOSIS — A0811 Acute gastroenteropathy due to Norwalk agent: Secondary | ICD-10-CM

## 2013-11-29 DIAGNOSIS — D6800 Von Willebrand disease, unspecified: Secondary | ICD-10-CM | POA: Diagnosis present

## 2013-11-29 DIAGNOSIS — K859 Acute pancreatitis without necrosis or infection, unspecified: Principal | ICD-10-CM

## 2013-11-29 DIAGNOSIS — N809 Endometriosis, unspecified: Secondary | ICD-10-CM

## 2013-11-29 DIAGNOSIS — E876 Hypokalemia: Secondary | ICD-10-CM

## 2013-11-29 DIAGNOSIS — G90A Postural orthostatic tachycardia syndrome (POTS): Secondary | ICD-10-CM

## 2013-11-29 DIAGNOSIS — E871 Hypo-osmolality and hyponatremia: Secondary | ICD-10-CM

## 2013-11-29 DIAGNOSIS — E860 Dehydration: Secondary | ICD-10-CM

## 2013-11-29 DIAGNOSIS — I10 Essential (primary) hypertension: Secondary | ICD-10-CM | POA: Diagnosis present

## 2013-11-29 DIAGNOSIS — K551 Chronic vascular disorders of intestine: Secondary | ICD-10-CM

## 2013-11-29 DIAGNOSIS — Z9884 Bariatric surgery status: Secondary | ICD-10-CM

## 2013-11-29 DIAGNOSIS — R000 Tachycardia, unspecified: Secondary | ICD-10-CM

## 2013-11-29 DIAGNOSIS — R1013 Epigastric pain: Secondary | ICD-10-CM

## 2013-11-29 DIAGNOSIS — R112 Nausea with vomiting, unspecified: Secondary | ICD-10-CM

## 2013-11-29 DIAGNOSIS — F3289 Other specified depressive episodes: Secondary | ICD-10-CM | POA: Diagnosis present

## 2013-11-29 DIAGNOSIS — E242 Drug-induced Cushing's syndrome: Secondary | ICD-10-CM

## 2013-11-29 DIAGNOSIS — Z8249 Family history of ischemic heart disease and other diseases of the circulatory system: Secondary | ICD-10-CM

## 2013-11-29 DIAGNOSIS — Z79899 Other long term (current) drug therapy: Secondary | ICD-10-CM

## 2013-11-29 DIAGNOSIS — J45909 Unspecified asthma, uncomplicated: Secondary | ICD-10-CM

## 2013-11-29 DIAGNOSIS — F411 Generalized anxiety disorder: Secondary | ICD-10-CM | POA: Diagnosis present

## 2013-11-29 DIAGNOSIS — E249 Cushing's syndrome, unspecified: Secondary | ICD-10-CM | POA: Diagnosis present

## 2013-11-29 DIAGNOSIS — D638 Anemia in other chronic diseases classified elsewhere: Secondary | ICD-10-CM

## 2013-11-29 DIAGNOSIS — F329 Major depressive disorder, single episode, unspecified: Secondary | ICD-10-CM | POA: Diagnosis present

## 2013-11-29 DIAGNOSIS — K900 Celiac disease: Secondary | ICD-10-CM

## 2013-11-29 DIAGNOSIS — I951 Orthostatic hypotension: Secondary | ICD-10-CM

## 2013-11-29 DIAGNOSIS — I498 Other specified cardiac arrhythmias: Secondary | ICD-10-CM

## 2013-11-29 HISTORY — DX: Acute pancreatitis without necrosis or infection, unspecified: K85.90

## 2013-11-29 LAB — CBC WITH DIFFERENTIAL/PLATELET
BLASTS: 0 %
Band Neutrophils: 0 % (ref 0–10)
Basophils Absolute: 0 10*3/uL (ref 0.0–0.1)
Basophils Relative: 0 % (ref 0–1)
EOS ABS: 0 10*3/uL (ref 0.0–0.7)
EOS PCT: 0 % (ref 0–5)
HCT: 39.2 % (ref 36.0–46.0)
Hemoglobin: 12.9 g/dL (ref 12.0–15.0)
Lymphocytes Relative: 21 % (ref 12–46)
Lymphs Abs: 2.6 10*3/uL (ref 0.7–4.0)
MCH: 33.4 pg (ref 26.0–34.0)
MCHC: 32.9 g/dL (ref 30.0–36.0)
MCV: 101.6 fL — ABNORMAL HIGH (ref 78.0–100.0)
MYELOCYTES: 1 %
Metamyelocytes Relative: 1 %
Monocytes Absolute: 0.6 10*3/uL (ref 0.1–1.0)
Monocytes Relative: 5 % (ref 3–12)
NEUTROS ABS: 9 10*3/uL — AB (ref 1.7–7.7)
NEUTROS PCT: 71 % (ref 43–77)
NRBC: 0 /100{WBCs}
PROMYELOCYTES ABS: 1 %
Platelets: 329 10*3/uL (ref 150–400)
RBC: 3.86 MIL/uL — AB (ref 3.87–5.11)
RDW: 12 % (ref 11.5–15.5)
WBC: 12.2 10*3/uL — ABNORMAL HIGH (ref 4.0–10.5)

## 2013-11-29 LAB — COMPREHENSIVE METABOLIC PANEL
ALK PHOS: 85 U/L (ref 39–117)
ALT: 31 U/L (ref 0–35)
AST: 26 U/L (ref 0–37)
Albumin: 3.5 g/dL (ref 3.5–5.2)
BUN: 8 mg/dL (ref 6–23)
CHLORIDE: 102 meq/L (ref 96–112)
CO2: 23 mEq/L (ref 19–32)
Calcium: 9.2 mg/dL (ref 8.4–10.5)
Creatinine, Ser: 0.7 mg/dL (ref 0.50–1.10)
GFR calc non Af Amer: >90 mL/min (ref >90)
GLUCOSE: 104 mg/dL — AB (ref 70–99)
POTASSIUM: 4.3 meq/L (ref 3.7–5.3)
Sodium: 140 mEq/L (ref 137–147)
Total Protein: 7.2 g/dL (ref 6.0–8.3)

## 2013-11-29 LAB — HCG, SERUM, QUALITATIVE: Preg, Serum: NEGATIVE

## 2013-11-29 LAB — LIPASE, BLOOD: LIPASE: 566 U/L — AB (ref 11–59)

## 2013-11-29 MED ORDER — ONDANSETRON HCL 4 MG/2ML IJ SOLN
4.0000 mg | Freq: Once | INTRAMUSCULAR | Status: AC
  Administered 2013-11-29: 4 mg via INTRAVENOUS
  Filled 2013-11-29: qty 2

## 2013-11-29 MED ORDER — MORPHINE SULFATE 4 MG/ML IJ SOLN
INTRAMUSCULAR | Status: AC
  Administered 2013-11-29: 4 mg via INTRAVENOUS
  Filled 2013-11-29: qty 1

## 2013-11-29 MED ORDER — MORPHINE SULFATE 4 MG/ML IJ SOLN
4.0000 mg | INTRAMUSCULAR | Status: DC | PRN
  Administered 2013-11-29: 4 mg via INTRAVENOUS

## 2013-11-29 MED ORDER — HYDROCORTISONE NA SUCCINATE PF 100 MG IJ SOLR
100.0000 mg | Freq: Once | INTRAMUSCULAR | Status: AC
  Administered 2013-11-29: 100 mg via INTRAVENOUS
  Filled 2013-11-29: qty 2

## 2013-11-29 MED ORDER — PROMETHAZINE HCL 25 MG/ML IJ SOLN
25.0000 mg | Freq: Once | INTRAMUSCULAR | Status: AC
  Administered 2013-11-29: 25 mg via INTRAVENOUS

## 2013-11-29 MED ORDER — HYDROMORPHONE HCL PF 1 MG/ML IJ SOLN
INTRAMUSCULAR | Status: AC
  Administered 2013-11-29: 1 mg via INTRAVENOUS
  Filled 2013-11-29: qty 1

## 2013-11-29 MED ORDER — PROMETHAZINE HCL 25 MG/ML IJ SOLN
INTRAMUSCULAR | Status: AC
  Filled 2013-11-29: qty 1

## 2013-11-29 MED ORDER — MORPHINE SULFATE 4 MG/ML IJ SOLN
4.0000 mg | INTRAMUSCULAR | Status: DC | PRN
  Administered 2013-11-29: 4 mg via INTRAVENOUS
  Filled 2013-11-29: qty 1

## 2013-11-29 MED ORDER — DIPHENOXYLATE-ATROPINE 2.5-0.025 MG PO TABS
2.0000 | ORAL_TABLET | Freq: Once | ORAL | Status: AC
  Administered 2013-11-29: 2 via ORAL
  Filled 2013-11-29: qty 2

## 2013-11-29 MED ORDER — ONDANSETRON HCL 4 MG/2ML IJ SOLN
INTRAMUSCULAR | Status: AC
  Filled 2013-11-29: qty 2

## 2013-11-29 MED ORDER — HYDROMORPHONE HCL PF 1 MG/ML IJ SOLN
1.0000 mg | INTRAMUSCULAR | Status: DC | PRN
  Administered 2013-11-29: 1 mg via INTRAVENOUS

## 2013-11-29 MED ORDER — ONDANSETRON HCL 4 MG/2ML IJ SOLN: 4.0000 mg | Freq: Once | INTRAMUSCULAR | Status: DC

## 2013-11-29 NOTE — ED Provider Notes (Signed)
CSN: [id number]     Arrival date & time 11/29/13  1749 History  This chart was scribed for Rolland Porter, MD by Bennett Scrape, ED Scribe. This patient was seen in room MH06/MH06 and the patient's care was started at 6:54 PM.   Chief Complaint  Patient presents with  . Dehydration    The history is provided by the patient. No language interpreter was used.    HPI Comments: Kaitlyn Johnson is a 33 y.o. female who presents to the Emergency Department complaining of worsening of ongoing diarrhea with associated abdominal pain and tachycardia. Pt reports that she began having the symptoms 5 days ago. She was admitted for tachycardia and symptom control. She was diagnosed with norovirus through a stool study 3 days ago while in the hospital and was discharged home shortly after. She was started on cortef 10 mg in the morning and 5 mg in the afternoon as well as Zofran. She denies receiving any specific medications for the diarrhea.She states that she started having abdominal pain yesterday with the nausea and diarrhea starting again today. She reports that she was evaluated at a walk-in [facility] clinic this morning. She was told that she was dehydrated and tachycardic which needed to be treated with IV fluids and steroids. She reports that she took Zofran about 2 hours ago with improvement in the nausea. She states that she took her steroid dose and then saw the pill in her diarrhea which caused her enough concern to come in tonight. She denies any hematochezia or fever.    Past Medical History  Diagnosis Date  . Von Willebrand disease     bruising easy  . Anemia 11/26/2011  . POTS (postural orthostatic tachycardia syndrome) 11/26/2011  . POTS (postural orthostatic tachycardia syndrome)   . Asthma   . Anxiety   . Depression   . H/O hiatal hernia   . Celiac and mesenteric artery injury   . Celiac and mesenteric artery injury    Past Surgical History  Procedure Laterality Date  . Abdominal  hysterectomy    . Laparoscopic endometriosis fulguration    . Roux-en-y procedure    . Cholecystectomy    . Anterior cervical decomp/discectomy fusion N/A 03/31/2013    Procedure: ANTERIOR CERVICAL DECOMPRESSION/DISCECTOMY FUSION 1 LEVEL Cervical five-six;  Surgeon: Reinaldo Meeker, MD;  Location: [facility];  Service: Neurosurgery;  Laterality: N/A;   Family History  Problem Relation Age of Onset  . Hypertension Mother    History  Substance Use Topics  . Smoking status: Never Smoker   . Smokeless tobacco: Never Used  . Alcohol Use: 3.0 oz/week    5 Glasses of wine per week   No OB history provided.  Review of Systems  Constitutional: Negative for fever, chills, diaphoresis, appetite change and fatigue.  HENT: Negative for mouth sores, sore throat and trouble swallowing.   Eyes: Negative for visual disturbance.  Respiratory: Negative for cough, chest tightness, shortness of breath and wheezing.   Cardiovascular: Negative for chest pain.  Gastrointestinal: Positive for nausea, abdominal pain and diarrhea. Negative for vomiting and abdominal distention.  Endocrine: Negative for polydipsia, polyphagia and polyuria.  Genitourinary: Negative for dysuria, frequency and hematuria.  Musculoskeletal: Negative for gait problem.  Skin: Negative for color change, pallor and rash.  Neurological: Negative for dizziness, syncope, light-headedness and headaches.  Hematological: Does not bruise/bleed easily.  Psychiatric/Behavioral: Negative for behavioral problems and confusion.    Allergies  Shellfish allergy; Reglan; Wheat bran; Sulfa antibiotics; and  Tartrazine  Home Medications   No current outpatient prescriptions on file. Triage Vitals: BP 119/93  Pulse 103  Temp(Src) 97.8 F (36.6 C) (Oral)  Resp 18  Ht 5\' 9"  (1.753 m)  Wt 212 lb (96.163 kg)  BMI 31.29 kg/m2  SpO2 100%  Physical Exam  Nursing note and vitals reviewed. Constitutional: She is oriented to person, place, and  time. She appears well-developed and well-nourished. No distress.  HENT:  Head: Normocephalic and atraumatic.  Cushingoid facies   Eyes: Conjunctivae are normal. Pupils are equal, round, and reactive to light. No scleral icterus.  Neck: Normal range of motion. Neck supple. No thyromegaly present.  Cardiovascular: Normal rate and regular rhythm.  Exam reveals no gallop and no friction rub.   No murmur heard. Pulmonary/Chest: Effort normal and breath sounds normal. No respiratory distress. She has no wheezes. She has no rales.  Abdominal: Soft. Bowel sounds are normal. She exhibits no distension. There is tenderness (slight diffuse). There is no rebound.  Musculoskeletal: Normal range of motion.  Neurological: She is alert and oriented to person, place, and time.  Skin: Skin is warm and dry. No rash noted.  Psychiatric: She has a normal mood and affect. Her behavior is normal.    ED Course  Procedures (including critical care time)  Medications  promethazine (PHENERGAN) 25 MG/ML injection (not administered)  Lurasidone HCl TABS 20 mg (20 mg Oral Given 12/01/13 1739)  albuterol (PROVENTIL) (2.5 MG/3ML) 0.083% nebulizer solution 2.5 mg (not administered)  diltiazem (DILACOR XR) 24 hr capsule 180 mg (180 mg Oral Given 12/01/13 1053)  metoprolol tartrate (LOPRESSOR) tablet 75 mg (75 mg Oral Given 12/01/13 2139)  enoxaparin (LOVENOX) injection 50 mg (50 mg Subcutaneous Given 12/01/13 1045)  0.9 %  sodium chloride infusion ( Intravenous Rate/Dose Verify 12/01/13 0517)  acetaminophen (TYLENOL) tablet 650 mg (650 mg Oral Given 12/01/13 0540)    Or  acetaminophen (TYLENOL) suppository 650 mg ( Rectal See Alternative 12/01/13 0540)  oxyCODONE (Oxy IR/ROXICODONE) immediate release tablet 5 mg (not administered)  morphine 2 MG/ML injection 2 mg (2 mg Intravenous Given 12/01/13 2143)  ondansetron (ZOFRAN) tablet 4 mg ( Oral See Alternative 12/02/13 0346)    Or  ondansetron (ZOFRAN) injection 4 mg (4 mg Intravenous  Given 12/02/13 0346)  alum & mag hydroxide-simeth (MAALOX/MYLANTA) 200-200-20 MG/5ML suspension 30 mL (not administered)  LORazepam (ATIVAN) injection 1 mg (1 mg Intravenous Given 12/01/13 1955)  hydrocortisone (CORTEF) tablet 10 mg (not administered)  hydrocortisone (CORTEF) tablet 5 mg (5 mg Oral Given 12/01/13 1739)  ondansetron (ZOFRAN) injection 4 mg (4 mg Intravenous Given 11/29/13 1923)  diphenoxylate-atropine (LOMOTIL) 2.5-0.025 MG per tablet 2 tablet (2 tablets Oral Given 11/29/13 1923)  hydrocortisone sodium succinate (SOLU-CORTEF) 100 MG injection 100 mg (100 mg Intravenous Given 11/29/13 1923)  promethazine (PHENERGAN) injection 25 mg (25 mg Intravenous Given 11/29/13 2240)  promethazine (PHENERGAN) injection 12.5 mg (12.5 mg Intravenous Given 12/01/13 1242)    DIAGNOSTIC STUDIES: Oxygen Saturation is 100% on RA, normal by my interpretation.    COORDINATION OF CARE: 7:00 PM-Discussed treatment plan which includes mediations, CBC panel and CMP with pt at bedside and pt agreed to plan.   Labs Review Labs Reviewed  CBC WITH DIFFERENTIAL - Abnormal; Notable for the following:    WBC 12.2 (*)    RBC 3.86 (*)    MCV 101.6 (*)    Neutro Abs 9.0 (*)    All other components within normal limits  COMPREHENSIVE METABOLIC PANEL - Abnormal;  Notable for the following:    Glucose, Bld 104 (*)    Total Bilirubin <0.2 (*)    All other components within normal limits  LIPASE, BLOOD - Abnormal; Notable for the following:    Lipase 566 (*)    All other components within normal limits  SEDIMENTATION RATE - Abnormal; Notable for the following:    Sed Rate 36 (*)    All other components within normal limits  LIPASE, BLOOD - Abnormal; Notable for the following:    Lipase 218 (*)    All other components within normal limits  LIPID PANEL - Abnormal; Notable for the following:    LDL Cholesterol 117 (*)    All other components within normal limits  CBC - Abnormal; Notable for the following:    WBC 11.7  (*)    RBC 3.68 (*)    All other components within normal limits  COMPREHENSIVE METABOLIC PANEL - Abnormal; Notable for the following:    Glucose, Bld 139 (*)    Albumin 3.1 (*)    Total Bilirubin <0.2 (*)    All other components within normal limits  CBC - Abnormal; Notable for the following:    WBC 11.4 (*)    RBC 3.66 (*)    Hemoglobin 11.9 (*)    All other components within normal limits  URINE RAPID DRUG SCREEN (HOSP PERFORMED) - Abnormal; Notable for the following:    Opiates POSITIVE (*)    Barbiturates POSITIVE (*)    All other components within normal limits  CBC - Abnormal; Notable for the following:    RBC 3.82 (*)    All other components within normal limits  LIPASE, BLOOD - Abnormal; Notable for the following:    Lipase 134 (*)    All other components within normal limits  LIPASE, BLOOD - Abnormal; Notable for the following:    Lipase 127 (*)    All other components within normal limits  HCG, SERUM, QUALITATIVE  MAGNESIUM  CREATININE, SERUM   Imaging Review US Abdomen Complete  11/30/2013   CLINICAL DATA:  33 year old female with acute pancreatitis. Initial encounter.  EXAM: ULTRASOUND ABDOMEN COMPLETE  COMPARISON:  Abdomen MRA 08/07/2012. CT Abdomen and Pelvis 07/02/2012 and earlier.  FINDINGS: Gallbladder:  Surgically absent.  Common bile duct:  Diameter: 5 mm, within normal limits.  Liver:  Stable mild intrahepatic ductal enlargement, postoperative. Echotexture within normal limits. No discrete liver lesion.  IVC:  No abnormality visualized.  Pancreas:  Visualized portion unremarkable.  Spleen:  Size and appearance within normal limits.  Right Kidney:  Length: 10.0 cm. Echogenicity within normal limits. No mass or hydronephrosis visualized.  Left Kidney:  Length: 11.9 cm. Echogenicity within normal limits. No mass or hydronephrosis visualized.  Abdominal aorta:  No aneurysm visualized.  Other findings:  No free fluid or fluid collection identified.  IMPRESSION: Negative  sonographic appearance of the abdomen; chronic cholecystectomy changes.   Electronically Signed   By: Augusto Gamble M.D.   On: 11/30/2013 14:08      MDM   1. Pancreatitis   2. Abdominal pain, epigastric   3. Acute pancreatitis   4. Dehydration   5. Iatrogenic Cushing's syndrome   6. Nausea vomiting and diarrhea   7. Nausea with vomiting     Patient continues to have pain despite IV pain medications. She's had additional episodes of diarrhea as well. Remains nauseated. However, no additional vomiting. Has a leukocytosis 12,000. Lipase is elevated at 566.  Her daughter surgically absent.  She's not any offending medications that would cause a pancreatitis. She does not drink. No history of personal or familial hypertriglyceridemia. She had a sphincter MOD dysfunction diagnosed 2 years ago. She underwent a sphincterotomy. Did not have a pancreatitis at the time per her knowledge. Has had no additional difficulties.  I personally performed the services described in this documentation, which was scribed in my presence. The recorded information has been reviewed and is accurate.    Rolland PorterMark Shandrell Boda, MD 12/02/13 [phone]

## 2013-11-29 NOTE — ED Notes (Signed)
Patient was admitted for norovirus 1/26 and was told by her dr that she still needs more IV fluids

## 2013-11-29 NOTE — Progress Notes (Signed)
Kaitlyn Johnson [id number] Code Status: Full   Admission Data: 11/29/2013 11:59 PM Attending Provider:  Vanessa Barbara Johnson:WJXB, CAMMIE, MD  Kaitlyn Johnson is a 33 y.o. female patient admitted from ED awake, alert - oriented  X 3 - no acute distress noted.  VSS - Blood pressure 147/98, pulse 80, temperature 97.7 F (36.5 C), temperature source Oral, resp. rate 20, height 5\' 9"  (1.753 m), weight 102 kg (224 lb 13.9 oz), SpO2 94.00%.  no c/o shortness of breath, no c/o chest pain.  IV Fluids:  IV in place, occlusive dsg intact without redness, IV cath antecubital right, condition patent and no redness normal saline.  Allergies:   Allergies  Allergen Reactions  . Shellfish Allergy Anaphylaxis  . Reglan [Metoclopramide] Other (See Comments)    oculogyral crisis  . Wheat Bran Other (See Comments)    Pt has Celiac disease  . Sulfa Antibiotics Rash  . Tartrazine Rash     Past Medical History  Diagnosis Date  . Von Willebrand disease     bruising easy  . Anemia 11/26/2011  . POTS (postural orthostatic tachycardia syndrome) 11/26/2011  . POTS (postural orthostatic tachycardia syndrome)   . Asthma   . Anxiety   . Depression   . H/O hiatal hernia   . Celiac and mesenteric artery injury   . Celiac and mesenteric artery injury    Medications Prior to Admission  Medication Sig Dispense Refill  . albuterol (PROVENTIL HFA;VENTOLIN HFA) 108 (90 BASE) MCG/ACT inhaler Inhale 2 puffs into the lungs every 6 (six) hours as needed for wheezing or shortness of breath. For shortness of breath      . clonazePAM (KLONOPIN) 1 MG tablet Take 1 mg by mouth 2 (two) times daily as needed for anxiety.       Marland Kitchen diltiazem (DILACOR XR) 180 MG 24 hr capsule Take 180 mg by mouth every morning.       . DULoxetine (CYMBALTA) 60 MG capsule Take 60 mg by mouth daily.      Marland Kitchen EPINEPHrine (EPIPEN) 0.3 mg/0.3 mL SOAJ injection Inject 0.3 mg into the muscle once as needed (severe allergic reaction).      .  hydrocortisone (CORTEF) 5 MG tablet Take 5-10 mg by mouth 2 (two) times daily. Take 10mg  in the morning and 5mg  in the afternoon      . hydrocortisone sodium succinate (SOLU-CORTEF) 100 mg/2 mL injection Inject 100 mg in the muscle over 30 seconds if you cannot take hydrocortisone by mouth  5 each  prn  . Lurasidone HCl (LATUDA) 20 MG TABS Take 20 mg by mouth every evening.       . metoprolol (LOPRESSOR) 50 MG tablet Take 75 mg by mouth 2 (two) times daily.      . montelukast (SINGULAIR) 10 MG tablet Take 10 mg by mouth at bedtime.      . ondansetron (ZOFRAN-ODT) 4 MG disintegrating tablet Take 4-8 mg by mouth every 8 (eight) hours as needed for nausea or vomiting.      Marland Kitchen zolpidem (AMBIEN) 10 MG tablet Take 10 mg by mouth at bedtime.       History:  obtained from the patient. Tobacco/alcohol: denied social drinker  Orientation to room, and floor completed with information packet given to patient/family.  Patient declined safety video at this time.  Admission INP armband ID verified with patient/family, and in place.   SR up x 2, fall assessment complete, with patient and family able to verbalize understanding of risk  associated with falls, and verbalized understanding to call nsg before up out of bed.  Call light within reach, patient able to voice, and demonstrate understanding.  Skin, clean-dry- intact without evidence of bruising, or skin tears.   No evidence of skin break down noted on exam.     Will cont to eval and treat per MD orders.  Dalbert MayotteMerritt, Munachimso Palin L, RN 11/29/2013 11:59 PM

## 2013-11-29 NOTE — Progress Notes (Signed)
Pt with abdominal pain, lipase > 500. Persistent pain, difficult to control vomiting and pain. Request [facility] for acute pancreatitis management.   Debbora PrestoMAGICK-Ticara Waner, MD  [facility] Pager [phone]  If 7PM-7AM, please contact night-coverage [email or web address] Password [email or web address]

## 2013-11-30 ENCOUNTER — Inpatient Hospital Stay (HOSPITAL_COMMUNITY): Payer: BC Managed Care – PPO

## 2013-11-30 DIAGNOSIS — R112 Nausea with vomiting, unspecified: Secondary | ICD-10-CM | POA: Insufficient documentation

## 2013-11-30 DIAGNOSIS — K859 Acute pancreatitis without necrosis or infection, unspecified: Secondary | ICD-10-CM

## 2013-11-30 DIAGNOSIS — R197 Diarrhea, unspecified: Secondary | ICD-10-CM

## 2013-11-30 DIAGNOSIS — E249 Cushing's syndrome, unspecified: Secondary | ICD-10-CM

## 2013-11-30 DIAGNOSIS — R1013 Epigastric pain: Secondary | ICD-10-CM | POA: Diagnosis present

## 2013-11-30 DIAGNOSIS — E860 Dehydration: Secondary | ICD-10-CM | POA: Diagnosis present

## 2013-11-30 DIAGNOSIS — T50904A Poisoning by unspecified drugs, medicaments and biological substances, undetermined, initial encounter: Secondary | ICD-10-CM

## 2013-11-30 HISTORY — DX: Acute pancreatitis without necrosis or infection, unspecified: K85.90

## 2013-11-30 HISTORY — DX: Nausea with vomiting, unspecified: R11.2

## 2013-11-30 LAB — CBC
HCT: 36.5 % (ref 36.0–46.0)
HEMATOCRIT: 36.5 % (ref 36.0–46.0)
HEMOGLOBIN: 12.1 g/dL (ref 12.0–15.0)
Hemoglobin: 11.9 g/dL — ABNORMAL LOW (ref 12.0–15.0)
MCH: 32.5 pg (ref 26.0–34.0)
MCH: 32.9 pg (ref 26.0–34.0)
MCHC: 32.6 g/dL (ref 30.0–36.0)
MCHC: 33.2 g/dL (ref 30.0–36.0)
MCV: 99.2 fL (ref 78.0–100.0)
MCV: 99.7 fL (ref 78.0–100.0)
Platelets: 325 10*3/uL (ref 150–400)
Platelets: 330 10*3/uL (ref 150–400)
RBC: 3.66 MIL/uL — ABNORMAL LOW (ref 3.87–5.11)
RBC: 3.68 MIL/uL — ABNORMAL LOW (ref 3.87–5.11)
RDW: 12.3 % (ref 11.5–15.5)
RDW: 12.4 % (ref 11.5–15.5)
WBC: 11.4 10*3/uL — AB (ref 4.0–10.5)
WBC: 11.7 10*3/uL — ABNORMAL HIGH (ref 4.0–10.5)

## 2013-11-30 LAB — COMPREHENSIVE METABOLIC PANEL
ALT: 25 U/L (ref 0–35)
AST: 17 U/L (ref 0–37)
Albumin: 3.1 g/dL — ABNORMAL LOW (ref 3.5–5.2)
Alkaline Phosphatase: 80 U/L (ref 39–117)
BUN: 6 mg/dL (ref 6–23)
CHLORIDE: 104 meq/L (ref 96–112)
CO2: 24 mEq/L (ref 19–32)
Calcium: 8.4 mg/dL (ref 8.4–10.5)
Creatinine, Ser: 0.62 mg/dL (ref 0.50–1.10)
GFR calc non Af Amer: >90 mL/min (ref >90)
GLUCOSE: 139 mg/dL — AB (ref 70–99)
POTASSIUM: 4.1 meq/L (ref 3.7–5.3)
Sodium: 141 mEq/L (ref 137–147)
Total Protein: 6.5 g/dL (ref 6.0–8.3)

## 2013-11-30 LAB — LIPID PANEL
CHOLESTEROL: 187 mg/dL (ref 0–200)
HDL: 49 mg/dL (ref >39)
LDL Cholesterol: 117 mg/dL — ABNORMAL HIGH (ref 0–99)
TRIGLYCERIDES: 105 mg/dL (ref <150)
Total CHOL/HDL Ratio: 3.8 RATIO
VLDL: 21 mg/dL (ref 0–40)

## 2013-11-30 LAB — RAPID URINE DRUG SCREEN, HOSP PERFORMED
Amphetamines: NOT DETECTED
BENZODIAZEPINES: NOT DETECTED
Barbiturates: POSITIVE — AB
COCAINE: NOT DETECTED
OPIATES: POSITIVE — AB
Tetrahydrocannabinol: NOT DETECTED

## 2013-11-30 LAB — CREATININE, SERUM
Creatinine, Ser: 0.61 mg/dL (ref 0.50–1.10)
GFR calc Af Amer: >90 mL/min (ref >90)
GFR calc non Af Amer: >90 mL/min (ref >90)

## 2013-11-30 LAB — SEDIMENTATION RATE: SED RATE: 36 mm/h — AB (ref 0–22)

## 2013-11-30 LAB — MAGNESIUM: Magnesium: 1.8 mg/dL (ref 1.5–2.5)

## 2013-11-30 LAB — LIPASE, BLOOD: LIPASE: 218 U/L — AB (ref 11–59)

## 2013-11-30 MED ORDER — ONDANSETRON HCL 4 MG/2ML IJ SOLN
4.0000 mg | Freq: Four times a day (QID) | INTRAMUSCULAR | Status: DC | PRN
  Administered 2013-11-30 – 2013-12-06 (×17): 4 mg via INTRAVENOUS
  Filled 2013-11-30 (×18): qty 2

## 2013-11-30 MED ORDER — ACETAMINOPHEN 325 MG PO TABS
650.0000 mg | ORAL_TABLET | Freq: Four times a day (QID) | ORAL | Status: DC | PRN
  Administered 2013-12-01: 650 mg via ORAL
  Filled 2013-11-30: qty 2

## 2013-11-30 MED ORDER — ONDANSETRON HCL 4 MG PO TABS: 4.0000 mg | ORAL_TABLET | Freq: Four times a day (QID) | ORAL | Status: DC | PRN

## 2013-11-30 MED ORDER — METOPROLOL TARTRATE 50 MG PO TABS
75.0000 mg | ORAL_TABLET | Freq: Two times a day (BID) | ORAL | Status: DC
  Administered 2013-11-30 – 2013-12-07 (×16): 75 mg via ORAL
  Filled 2013-11-30 (×17): qty 1

## 2013-11-30 MED ORDER — ALBUTEROL SULFATE (2.5 MG/3ML) 0.083% IN NEBU: 2.5000 mg | INHALATION_SOLUTION | Freq: Four times a day (QID) | RESPIRATORY_TRACT | Status: DC | PRN

## 2013-11-30 MED ORDER — DILTIAZEM HCL ER 180 MG PO CP24
180.0000 mg | ORAL_CAPSULE | Freq: Every morning | ORAL | Status: DC
  Administered 2013-11-30 – 2013-12-07 (×8): 180 mg via ORAL
  Filled 2013-11-30 (×8): qty 1

## 2013-11-30 MED ORDER — LURASIDONE HCL 20 MG PO TABS
20.0000 mg | ORAL_TABLET | Freq: Every evening | ORAL | Status: DC
  Administered 2013-11-30 – 2013-12-06 (×7): 20 mg via ORAL
  Filled 2013-11-30 (×8): qty 1

## 2013-11-30 MED ORDER — ACETAMINOPHEN 650 MG RE SUPP: 650.0000 mg | Freq: Four times a day (QID) | RECTAL | Status: DC | PRN

## 2013-11-30 MED ORDER — LORAZEPAM 2 MG/ML IJ SOLN
1.0000 mg | Freq: Four times a day (QID) | INTRAMUSCULAR | Status: DC | PRN
  Administered 2013-12-01 – 2013-12-06 (×5): 1 mg via INTRAVENOUS
  Filled 2013-11-30 (×5): qty 1

## 2013-11-30 MED ORDER — ALUM & MAG HYDROXIDE-SIMETH 200-200-20 MG/5ML PO SUSP: 30.0000 mL | Freq: Four times a day (QID) | ORAL | Status: DC | PRN

## 2013-11-30 MED ORDER — HYDROCORTISONE NA SUCCINATE PF 100 MG IJ SOLR
50.0000 mg | Freq: Two times a day (BID) | INTRAMUSCULAR | Status: DC
  Administered 2013-11-30: 20:00:00 via INTRAVENOUS
  Administered 2013-11-30 – 2013-12-01 (×2): 50 mg via INTRAVENOUS
  Filled 2013-11-30 (×5): qty 1

## 2013-11-30 MED ORDER — MORPHINE SULFATE 2 MG/ML IJ SOLN
2.0000 mg | INTRAMUSCULAR | Status: DC | PRN
  Administered 2013-11-30 – 2013-12-02 (×8): 2 mg via INTRAVENOUS
  Filled 2013-11-30 (×9): qty 1

## 2013-11-30 MED ORDER — ENOXAPARIN SODIUM 60 MG/0.6ML ~~LOC~~ SOLN
50.0000 mg | SUBCUTANEOUS | Status: DC
  Administered 2013-11-30 – 2013-12-05 (×6): 50 mg via SUBCUTANEOUS
  Administered 2013-12-06: 10:00:00 via SUBCUTANEOUS
  Filled 2013-11-30 (×8): qty 0.6

## 2013-11-30 MED ORDER — SODIUM CHLORIDE 0.9 % IV SOLN
INTRAVENOUS | Status: DC
  Administered 2013-11-30: 150 mL via INTRAVENOUS
  Administered 2013-11-30: 15:00:00 via INTRAVENOUS
  Administered 2013-11-30: 1000 mL via INTRAVENOUS
  Administered 2013-12-01 – 2013-12-04 (×8): via INTRAVENOUS

## 2013-11-30 MED ORDER — OXYCODONE HCL 5 MG PO TABS
5.0000 mg | ORAL_TABLET | ORAL | Status: DC | PRN
  Administered 2013-12-02 – 2013-12-07 (×2): 5 mg via ORAL
  Filled 2013-11-30 (×3): qty 1

## 2013-11-30 NOTE — Progress Notes (Signed)
Urine pregnancy test was not collected from pt because she refused due to the fact that she has had a hysterectomy.

## 2013-11-30 NOTE — Progress Notes (Addendum)
[facility] PROGRESS NOTE  Kaitlyn Johnson DTO:[id number] DOB: 03-15-1981 DOA: 11/29/2013 PCP: Antony Blackbird, MD   Primary gastroenterologist:  Dr Angela Nevin  Brief narrative 33 year old female with complex medical history including TOPS, asthma, depression, celiac disease, history of celiac and mesenteric artery injury, von Willebrand disease , history of multiple surgeries including a Roux-en-Y procedure, cholecystectomy, abdominal hysterectomy, anterior cervical discectomy, iatrogenic Cushing syndrome on steroid who was recently hospitalized for norovirus gastroenteritis presents with acute pain in her epigastric area radiating to the back with episodes of diarrhea and nausea. She was found to have an elevated lipase of 566.    Assessment/Plan: Acute pancreatitis etiology unclear. ? Steroid induced. She has history of cholecystectomy but does report a history of stricture or sphincter of oddi which needed sphincterotomy at [facility] 1 year back. -monitor lipase level. Check US abdomen  To see for CBD stone or stricture.  continue supportive care with IV fluids, narcotics and antiemetics. Keep NPO and start clears once pain better controlled. Will d/w her GI Dr Morene Crocker.    Diarrhea  possibly recent viral gastroenteritis. cdiff negative recently continue IV hydration.  HTN  resume home meds  Cushing's disease  follows with Dr Cruzita Lederer as outpt. On hydrocortisone as outpt. Place on IV hydrocortisone 50 mg bid until patient able to take po.  Hx of asthma Stable  Hx of von willebrand ds  stable. Follows with Dr Humphrey Rolls  Diet: NPO  DVT prophylaxis: sq lovenox   Code Status: full code Family Communication: father at bedside Disposition Plan: home once improved   Consultants:  none  Procedures:  none  Antibiotics:  none  HPI/Subjective: Patient seen and examined this am. reports epigastric pain and and nausea.  Objective: Filed Vitals:   11/30/13 0519   BP: 114/78  Pulse: 69  Temp: 97.8 F (36.6 C)  Resp: 18    Intake/Output Summary (Last 24 hours) at 11/30/13 1226 Last data filed at 11/30/13 0600  Gross per 24 hour  Intake    710 ml  Output      0 ml  Net    710 ml   Filed Weights   11/29/13 1756 11/29/13 2346  Weight: 96.163 kg (212 lb) 102 kg (224 lb 13.9 oz)    Exam:   General: middle aged female in NAD  HEENT: no pallor, dry oral mucosa  Chest: clear b/l, no added sounds  CVS: NS1&S2, no murmurs , rubs or gallop   abd: soft, ND, BS+,   XIP:JASN, no edema  CNS: AAOX3  Data Reviewed: Basic Metabolic Panel:  Recent Labs Lab 11/23/13 1704 11/24/13 0345 11/26/13 0405 11/29/13 1915 11/30/13 0130 11/30/13 0210  NA 134* 142 139 140  --  141  K 3.4* 3.8 3.4* 4.3  --  4.1  CL 99 107 106 102  --  104  CO2 22 23 25 23   --  24  GLUCOSE 97 115* 105* 104*  --  139*  BUN 6 4* 3* 8  --  6  CREATININE 0.64 0.67 0.58 0.70 0.61 0.62  CALCIUM 7.1* 7.5* 8.2* 9.2  --  8.4  MG  --   --  2.0  --  1.8  --    Liver Function Tests:  Recent Labs Lab 11/23/13 1704 11/24/13 0345 11/29/13 1915 11/30/13 0210  AST 17 27 26 17   ALT 17 25 31 25   ALKPHOS 56 62 85 80  BILITOT 0.3 <0.2* <0.2* <0.2*  PROT 6.0 6.5 7.2 6.5  ALBUMIN  2.8* 3.0* 3.5 3.1*    Recent Labs Lab 11/29/13 1915 11/30/13 0210  LIPASE 566* 218*   No results found for this basename: AMMONIA,  in the last 168 hours CBC:  Recent Labs Lab 11/23/13 1704 11/24/13 0345 11/29/13 1915 11/30/13 0130 11/30/13 0210  WBC 6.6 6.3 12.2* 11.7* 11.4*  NEUTROABS 5.6  --  9.0*  --   --   HGB 11.5* 12.7 12.9 12.1 11.9*  HCT 34.2* 38.2 39.2 36.5 36.5  MCV 100.0 100.3* 101.6* 99.2 99.7  PLT 189 208 329 330 325   Cardiac Enzymes:  Recent Labs Lab 11/23/13 1704 11/23/13 2040 11/24/13 0345  TROPONINI <0.30 <0.30 <0.30   BNP (last 3 results)  Recent Labs  11/23/13 1704 11/23/13 2040 11/24/13 0345  PROBNP 356.0* 408.5* 310.5*   CBG:  Recent  Labs Lab 11/24/13 0844 11/25/13 0800  GLUCAP 113* 113*    Recent Results (from the past 240 hour(s))  CLOSTRIDIUM DIFFICILE BY PCR     Status: None   Collection Time    11/23/13  5:41 PM      Result Value Range Status   C difficile by pcr NEGATIVE  NEGATIVE Final   Comment: Performed at [facility]     Status: None   Collection Time    11/24/13  3:39 PM      Result Value Range Status   Specimen Description STOOL   Final   Special Requests NONE   Final   Culture     Final   Value: NO SALMONELLA, SHIGELLA, CAMPYLOBACTER, YERSINIA, OR E.COLI 0157:H7 ISOLATED     Performed at [facility]   Report Status 11/28/2013 FINAL   Final  OVA AND PARASITE EXAMINATION     Status: None   Collection Time    11/24/13  3:39 PM      Result Value Range Status   Specimen Description STOOL   Final   Special Requests NONE   Final   Ova and parasites     Final   Value: NO OVA OR PARASITES SEEN     Performed at [facility]   Report Status 11/25/2013 FINAL   Final     Studies: No results found.  Scheduled Meds: . diltiazem  180 mg Oral q morning - 10a  . enoxaparin (LOVENOX) injection  50 mg Subcutaneous Q24H  . hydrocortisone sod succinate (SOLU-CORTEF) inj  50 mg Intravenous Q12H  . Lurasidone HCl  20 mg Oral QPM  . metoprolol  75 mg Oral BID   Continuous Infusions: . sodium chloride 150 mL (11/30/13 0713)      Time spent:25 minutes    Gerod Caligiuri, [place]  [facility] Pager [phone] If 7PM-7AM, please contact night-coverage at [email or web address], password [facility] 11/30/2013, 12:26 PM  LOS: 1 day

## 2013-11-30 NOTE — H&P (Signed)
[facility] History and Physical  Kaitlyn Johnson [place] ONG:[id number] DOB: 08-06-1981 DOA: 11/29/2013  Referring physician:  PCP: Cain SaupeFULP, CAMMIE, MD   Chief Complaint:   HPI: Kaitlyn Johnson is a 33 y.o. female with a past medical history of postural orthostatic tachycardia syndrome, Cushing syndrome,  hypertension who is admitted to the medicine service on  11/23/2013, presenting with nausea vomiting and diarrhea, labs positive for norovirus virus. She was discharged in stable condition on 11/26/2013. In the last 24 hours she reported developing epigastric abdominal pain characterized as sharp stabbing, becoming progressively worse, radiating to her back in a bandlike pattern. This was associated with nausea and having minimal by mouth intake. She also reports ongoing diarrhea having multiple episodes of nonbloody liquid consistency bowel movements in the last 24-48 hours. She denies melena, hematemesis, chest pain, shortness of breath. She does report generalized weakness, malaise and feeling dehydrated. [facility] revealed an elevated lipase level of 566. She denies consumption of illicit drugs or alcohol.                                                                                                                                                                                                                                 Review of Systems:  Constitutional:  No weight loss, night sweats, Fevers, chills, fatigue.  HEENT:  No headaches, Difficulty swallowing,Tooth/dental problems,Sore throat,  No sneezing, itching, ear ache, nasal congestion, post nasal drip,  Cardio-vascular:  No chest pain, Orthopnea, PND, swelling in lower extremities, anasarca, dizziness, palpitations  GI:  Positive for diarrhea, abdominal pain, nausea, and anorexia.  Resp:  No shortness of breath with exertion or at rest. No excess mucus, no productive cough, No non-productive  cough, No coughing up of blood.No change in color of mucus.No wheezing.No chest wall deformity  Skin:  no rash or lesions.  GU:  no dysuria, change in color of urine, no urgency or frequency. No flank pain.  Musculoskeletal:  No joint pain or swelling. No decreased range of motion. No back pain.  Psych:  No change in mood or affect. No depression or anxiety. No memory loss.   Past Medical History  Diagnosis Date  . Von Willebrand disease     bruising easy  . Anemia 11/26/2011  . POTS (postural orthostatic tachycardia syndrome) 11/26/2011  . POTS (postural orthostatic tachycardia syndrome)   . Asthma   . Anxiety   .  Depression   . H/O hiatal hernia   . Celiac and mesenteric artery injury   . Celiac and mesenteric artery injury    Past Surgical History  Procedure Laterality Date  . Abdominal hysterectomy    . Laparoscopic endometriosis fulguration    . Roux-en-y procedure    . Cholecystectomy    . Anterior cervical decomp/discectomy fusion N/A 03/31/2013    Procedure: ANTERIOR CERVICAL DECOMPRESSION/DISCECTOMY FUSION 1 LEVEL Cervical five-six;  Surgeon: Reinaldo Meeker, MD;  Location: [facility];  Service: Neurosurgery;  Laterality: N/A;   Social History:  reports that she has never smoked. She has never used smokeless tobacco. She reports that she drinks about 3.0 ounces of alcohol per week. She reports that she does not use illicit drugs.  Allergies  Allergen Reactions  . Shellfish Allergy Anaphylaxis  . Reglan [Metoclopramide] Other (See Comments)    oculogyral crisis  . Wheat Bran Other (See Comments)    Pt has Celiac disease  . Sulfa Antibiotics Rash  . Tartrazine Rash    Family History  Problem Relation Age of Onset  . Hypertension Mother      Prior to Admission medications   Medication Sig Start Date End Date Taking? Authorizing Provider  albuterol (PROVENTIL HFA;VENTOLIN HFA) 108 (90 BASE) MCG/ACT inhaler Inhale 2 puffs into the lungs every 6 (six) hours as  needed for wheezing or shortness of breath. For shortness of breath    Historical Provider, MD  clonazePAM (KLONOPIN) 1 MG tablet Take 1 mg by mouth 2 (two) times daily as needed for anxiety.     Historical Provider, MD  diltiazem (DILACOR XR) 180 MG 24 hr capsule Take 180 mg by mouth every morning.     Historical Provider, MD  DULoxetine (CYMBALTA) 60 MG capsule Take 60 mg by mouth daily.    Historical Provider, MD  EPINEPHrine (EPIPEN) 0.3 mg/0.3 mL SOAJ injection Inject 0.3 mg into the muscle once as needed (severe allergic reaction).    Historical Provider, MD  hydrocortisone (CORTEF) 5 MG tablet Take 5-10 mg by mouth 2 (two) times daily. Take 10mg  in the morning and 5mg  in the afternoon    Historical Provider, MD  hydrocortisone sodium succinate (SOLU-CORTEF) 100 mg/2 mL injection Inject 100 mg in the muscle over 30 seconds if you cannot take hydrocortisone by mouth 10/01/13   Carlus Pavlov, MD  Lurasidone HCl (LATUDA) 20 MG TABS Take 20 mg by mouth every evening.     Historical Provider, MD  metoprolol (LOPRESSOR) 50 MG tablet Take 75 mg by mouth 2 (two) times daily.    Historical Provider, MD  montelukast (SINGULAIR) 10 MG tablet Take 10 mg by mouth at bedtime.    Historical Provider, MD  ondansetron (ZOFRAN-ODT) 4 MG disintegrating tablet Take 4-8 mg by mouth every 8 (eight) hours as needed for nausea or vomiting.    Historical Provider, MD  zolpidem (AMBIEN) 10 MG tablet Take 10 mg by mouth at bedtime.    Historical Provider, MD   Physical Exam: Filed Vitals:   11/29/13 2346  BP: 147/98  Pulse: 80  Temp: 97.7 F (36.5 C)  Resp: 20    BP 147/98  Pulse 80  Temp(Src) 97.7 F (36.5 C) (Oral)  Resp 20  Ht 5\' 9"  (1.753 m)  Wt 102 kg (224 lb 13.9 oz)  BMI 33.19 kg/m2  SpO2 94%  General:  Appears calm and comfortable, in no acute distress Eyes: PERRL, normal lids, irises & conjunctiva ENT: grossly normal hearing,  lips & tongue Neck: no LAD, masses or  thyromegaly Cardiovascular: RRR, no m/r/g. No LE edema. Telemetry: SR, no arrhythmias  Respiratory: CTA bilaterally, no w/r/r. Normal respiratory effort. Abdomen: She has pain to palpation mainly over the epigastric region and left upper quadrant region. There was no rebound tenderness guarding or periumbilical discoloration. Skin: no rash or induration seen on limited exam Musculoskeletal: grossly normal tone BUE/BLE Psychiatric: grossly normal mood and affect, speech fluent and appropriate Neurologic: grossly non-focal.          Labs on Admission:  Basic Metabolic Panel:  Recent Labs Lab 11/23/13 0156 11/23/13 1200 11/23/13 1704 11/24/13 0345 11/26/13 0405 11/29/13 1915  NA 136*  --  134* 142 139 140  K 3.8  --  3.4* 3.8 3.4* 4.3  CL 98  --  99 107 106 102  CO2 21  --  22 23 25 23   GLUCOSE 91  --  97 115* 105* 104*  BUN 14  --  6 4* 3* 8  CREATININE 0.79  --  0.64 0.67 0.58 0.70  CALCIUM 8.7  --  7.1* 7.5* 8.2* 9.2  MG  --  1.4*  --   --  2.0  --   PHOS  --  2.5  --   --   --   --    Liver Function Tests:  Recent Labs Lab 11/23/13 0156 11/23/13 1704 11/24/13 0345 11/29/13 1915  AST 17 17 27 26   ALT 21 17 25 31   ALKPHOS 85 56 62 85  BILITOT 0.3 0.3 <0.2* <0.2*  PROT 7.3 6.0 6.5 7.2  ALBUMIN 3.8 2.8* 3.0* 3.5    Recent Labs Lab 11/29/13 1915  LIPASE 566*   No results found for this basename: AMMONIA,  in the last 168 hours CBC:  Recent Labs Lab 11/23/13 0156 11/23/13 1704 11/24/13 0345 11/29/13 1915  WBC 5.8 6.6 6.3 12.2*  NEUTROABS 4.7 5.6  --  9.0*  HGB 13.7 11.5* 12.7 12.9  HCT 40.9 34.2* 38.2 39.2  MCV 101.0* 100.0 100.3* 101.6*  PLT 178 189 208 329   Cardiac Enzymes:  Recent Labs Lab 11/23/13 1704 11/23/13 2040 11/24/13 0345  TROPONINI <0.30 <0.30 <0.30    BNP (last 3 results)  Recent Labs  11/23/13 1704 11/23/13 2040 11/24/13 0345  PROBNP 356.0* 408.5* 310.5*   CBG:  Recent Labs Lab 11/24/13 0844 11/25/13 0800   GLUCAP 113* 113*    Radiological Exams on Admission: No results found.  EKG: Independently reviewed.   Assessment/Plan Active Problems:   Pancreatitis   Abdominal pain, epigastric   Nausea and vomiting   Gastroenteritis due to norovirus   Dehydration   Acute pancreatitis   1. Acute pancreatitis. Etiology unclear. She presented with complaints of abdominal pain to [facility] today, where labs showed an elevated lipase of 566. She has a history of cholecystectomy and denies consumption of alcohol or illicit drugs. Will keep her n.p.o., provide IV fluid resuscitation, as needed narcotic analgesics, supportive care. Will check a.m. lipid panel, urine drug screen, sedimentation rate. 2. Diarrhea. Patient complains of ongoing diarrhea and nausea, recently diagnosed with gastroenteritis do to norovirus. Given recent hospitalization and history of chronic steroid use, will check a stool for C. difficile. Meanwhile continue supportive care, IV fluid hydration, as needed entiemetic therapy. 3. Dehydration. Patient appearing clinically dehydrated, will provide IV fluid resuscitation with normal saline at 150 mL per hour. 4. History of Cushing syndrome. Patient reports nausea, inability to take by  mouth for now, well provide IV hydrocortisone 50 mg twice a day, transition to oral regimen once symptoms improve. 5. Hypertension. Patient's blood pressures stable, continue home regimen with metoprolol and Cardizem. Consider changing to IV as she cannot tolerate by mouth. 6. History of asthma. Stable, no active pulmonary issues at this time 7. DVT prophylaxis. Lovenox    Code Status: Full code Disposition Plan: Will admit patient to the inpatient service, anticipate she will require greater than 2 night  Time spent: 70 minutes  Jeralyn Johnson [facility] Pager [phone]

## 2013-11-30 NOTE — Progress Notes (Addendum)
Patient's father instructed as to personal protective equipment for possible cdiff.  He states every time she comes they put her on this isolation and she never has cdiff and he does not feel its necessary for him.  Notified patient that we needed to collect a stool and an urine specimen.  She states she is not clear why the doctor ordered a pregnancy test since her chart obviously shows she has had a hysterectomy and both patient and father express their concern about so many doctors involved in her care yet feel no one is doing anything for her.  Explained the rounding hours, Dr. Windy Cannyunghel will be her MD today

## 2013-12-01 LAB — CBC
HEMATOCRIT: 38.1 % (ref 36.0–46.0)
Hemoglobin: 12.7 g/dL (ref 12.0–15.0)
MCH: 33.2 pg (ref 26.0–34.0)
MCHC: 33.3 g/dL (ref 30.0–36.0)
MCV: 99.7 fL (ref 78.0–100.0)
Platelets: 337 10*3/uL (ref 150–400)
RBC: 3.82 MIL/uL — ABNORMAL LOW (ref 3.87–5.11)
RDW: 12.3 % (ref 11.5–15.5)
WBC: 8.7 10*3/uL (ref 4.0–10.5)

## 2013-12-01 LAB — LIPASE, BLOOD: Lipase: 134 U/L — ABNORMAL HIGH (ref 11–59)

## 2013-12-01 MED ORDER — HYDROCORTISONE 5 MG PO TABS
5.0000 mg | ORAL_TABLET | Freq: Every day | ORAL | Status: DC
  Administered 2013-12-01 – 2013-12-06 (×6): 5 mg via ORAL
  Filled 2013-12-01 (×7): qty 1

## 2013-12-01 MED ORDER — HYDROCORTISONE 5 MG PO TABS: 5.0000 mg | ORAL_TABLET | Freq: Two times a day (BID) | ORAL | Status: DC

## 2013-12-01 MED ORDER — HYDROCORTISONE 10 MG PO TABS
10.0000 mg | ORAL_TABLET | Freq: Every day | ORAL | Status: DC
  Administered 2013-12-02 – 2013-12-07 (×6): 10 mg via ORAL
  Filled 2013-12-01 (×7): qty 1

## 2013-12-01 MED ORDER — PROMETHAZINE HCL 25 MG/ML IJ SOLN
12.5000 mg | Freq: Once | INTRAMUSCULAR | Status: AC
  Administered 2013-12-01: 12.5 mg via INTRAVENOUS
  Filled 2013-12-01: qty 1

## 2013-12-01 NOTE — Progress Notes (Signed)
Patient and her mother ambulated to [address].  Patient states she tolerated the walk well, encouraged to be out of bed several times today to increase her tolerance of activity

## 2013-12-01 NOTE — Progress Notes (Signed)
[facility] PROGRESS NOTE  Kaitlyn Johnson DJT:[id number] DOB: [birth year] DOA: 11/29/2013 PCP: Antony Blackbird, MD  Primary gastroenterologist:  Dr Angela Nevin  Brief narrative  33 year old female with complex medical history including TOPS, asthma, depression, celiac disease, history of celiac and mesenteric artery injury, von Willebrand disease , history of multiple surgeries including a Roux-en-Y procedure, cholecystectomy, abdominal hysterectomy, anterior cervical discectomy, iatrogenic Cushing syndrome on steroid who was recently hospitalized for norovirus gastroenteritis presents with acute pain in her epigastric area radiating to the back with episodes of diarrhea and nausea. She was found to have an elevated lipase of 566.   Assessment/Plan:  Acute pancreatitis  etiology unclear. ? Steroid induced. 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  -US abdomen negative for CBD stone or stricture.  continue supportive care with IV fluids, narcotics and antiemetics. -Abdominal pain appears to improve slowly. Will start on clear liquids. Lipase trending down. -Discussed with her GI doctor Medoff on 2/2 and agrees with the plan. He will see her as outpatient upon discharge. -Continue serial abdominal exam.  Diarrhea  possibly recent viral gastroenteritis. cdiff negative recently  continue IV hydration.   HTN  resume home meds   Cushing's disease  follows with Dr Cruzita Lederer as outpt. On hydrocortisone as outpt. Place on IV hydrocortisone 50 mg bid . Will switch to  her home dose .  Hx of asthma  Stable   Hx of von willebrand ds  stable. Follows with Dr Humphrey Rolls   Diet: Clear liquids  DVT prophylaxis: sq lovenox   Code Status: full code   Family Communication: Mother at bedside   Disposition Plan: home once improved   Consultants:  None   Procedures:  None   Antibiotics:   none HPI/Subjective:  Patient seen and examined this am. epigastric pain and and nausea improved slightly    Objective: Filed Vitals:   12/01/13 0514  BP: 132/88  Pulse: 71  Temp: 97.7 F (36.5 C)  Resp: 18    Intake/Output Summary (Last 24 hours) at 12/01/13 1233 Last data filed at 12/01/13 0900  Gross per 24 hour  Intake   3600 ml  Output   1200 ml  Net   2400 ml   Filed Weights   11/29/13 1756 11/29/13 2346  Weight: 96.163 kg (212 lb) 102 kg (224 lb 13.9 oz)    Exam:  General: middle aged female in NAD  HEENT: no pallor, dry oral mucosa  Chest: clear b/l, no added sounds  CVS: NS1&S2, no murmurs , rubs or gallop  abd: soft, ND, BS+,  ZES:PQZR, no edema  CNS: AAOX3   Data Reviewed: Basic Metabolic Panel:  Recent Labs Lab 11/26/13 0405 11/29/13 1915 11/30/13 0130 11/30/13 0210  NA 139 140  --  141  K 3.4* 4.3  --  4.1  CL 106 102  --  104  CO2 25 23  --  24  GLUCOSE 105* 104*  --  139*  BUN 3* 8  --  6  CREATININE 0.58 0.70 0.61 0.62  CALCIUM 8.2* 9.2  --  8.4  MG 2.0  --  1.8  --    Liver Function Tests:  Recent Labs Lab 11/29/13 1915 11/30/13 0210  AST 26 17  ALT 31 25  ALKPHOS 85 80  BILITOT <0.2* <0.2*  PROT 7.2 6.5  ALBUMIN 3.5 3.1*    Recent Labs Lab 11/29/13 1915 11/30/13 0210 12/01/13  0530  LIPASE 566* 218* 134*   No results found for this basename: AMMONIA,  in the last 168 hours CBC:  Recent Labs Lab 11/29/13 1915 11/30/13 0130 11/30/13 0210 12/01/13 0530  WBC 12.2* 11.7* 11.4* 8.7  NEUTROABS 9.0*  --   --   --   HGB 12.9 12.1 11.9* 12.7  HCT 39.2 36.5 36.5 38.1  MCV 101.6* 99.2 99.7 99.7  PLT 329 330 325 337   Cardiac Enzymes: No results found for this basename: CKTOTAL, CKMB, CKMBINDEX, TROPONINI,  in the last 168 hours BNP (last 3 results)  Recent Labs  11/23/13 1704 11/23/13 2040 11/24/13 0345  PROBNP 356.0* 408.5* 310.5*   CBG:  Recent Labs Lab 11/25/13 0800  GLUCAP 113*    Recent  Results (from the past 240 hour(s))  CLOSTRIDIUM DIFFICILE BY PCR     Status: None   Collection Time    11/23/13  5:41 PM      Result Value Range Status   C difficile by pcr NEGATIVE  NEGATIVE Final   Comment: Performed at [facility]     Status: None   Collection Time    11/24/13  3:39 PM      Result Value Range Status   Specimen Description STOOL   Final   Special Requests NONE   Final   Culture     Final   Value: NO SALMONELLA, SHIGELLA, CAMPYLOBACTER, YERSINIA, OR E.COLI 0157:H7 ISOLATED     Performed at [facility]   Report Status 11/28/2013 FINAL   Final  OVA AND PARASITE EXAMINATION     Status: None   Collection Time    11/24/13  3:39 PM      Result Value Range Status   Specimen Description STOOL   Final   Special Requests NONE   Final   Ova and parasites     Final   Value: NO OVA OR PARASITES SEEN     Performed at [facility]   Report Status 11/25/2013 FINAL   Final     Studies: US Abdomen Complete  11/30/2013   CLINICAL DATA:  33 year old female with acute pancreatitis. Initial encounter.  EXAM: ULTRASOUND ABDOMEN COMPLETE  COMPARISON:  Abdomen MRA 08/07/2012. CT Abdomen and Pelvis 07/02/2012 and earlier.  FINDINGS: Gallbladder:  Surgically absent.  Common bile duct:  Diameter: 5 mm, within normal limits.  Liver:  Stable mild intrahepatic ductal enlargement, postoperative. Echotexture within normal limits. No discrete liver lesion.  IVC:  No abnormality visualized.  Pancreas:  Visualized portion unremarkable.  Spleen:  Size and appearance within normal limits.  Right Kidney:  Length: 10.0 cm. Echogenicity within normal limits. No mass or hydronephrosis visualized.  Left Kidney:  Length: 11.9 cm. Echogenicity within normal limits. No mass or hydronephrosis visualized.  Abdominal aorta:  No aneurysm visualized.  Other findings:  No free fluid or fluid collection identified.  IMPRESSION: Negative sonographic appearance of the abdomen;  chronic cholecystectomy changes.   Electronically Signed   By: Lars Pinks M.D.   On: 11/30/2013 14:08    Scheduled Meds: . diltiazem  180 mg Oral q morning - 10a  . enoxaparin (LOVENOX) injection  50 mg Subcutaneous Q24H  . hydrocortisone sod succinate (SOLU-CORTEF) inj  50 mg Intravenous Q12H  . Lurasidone HCl  20 mg Oral QPM  . metoprolol  75 mg Oral BID  . promethazine  12.5 mg Intravenous Once   Continuous Infusions: . sodium chloride 150 mL/hr at 12/01/13 3664  Time spent: 25 minutes    Emiliano Welshans  [facility] Pager [phone]. If 7PM-7AM, please contact night-coverage at [email or web address], password [facility] 12/01/2013, 12:33 PM  LOS: 2 days

## 2013-12-01 NOTE — Progress Notes (Signed)
Pt slept on and off during night.  Medicated multi time with morphine due to abd pain.  Up to bathroom ad lib.  Call bell in reach.  C/o headache this am tylenol 650 mg given po.

## 2013-12-02 LAB — LIPASE, BLOOD: Lipase: 127 U/L — ABNORMAL HIGH (ref 11–59)

## 2013-12-02 MED ORDER — HYDROMORPHONE HCL PF 1 MG/ML IJ SOLN
1.0000 mg | INTRAMUSCULAR | Status: DC | PRN
  Administered 2013-12-02 – 2013-12-06 (×17): 1 mg via INTRAVENOUS
  Filled 2013-12-02 (×17): qty 1

## 2013-12-02 MED ORDER — PROMETHAZINE HCL 25 MG/ML IJ SOLN
12.5000 mg | Freq: Four times a day (QID) | INTRAMUSCULAR | Status: DC | PRN
  Administered 2013-12-02 – 2013-12-07 (×8): 12.5 mg via INTRAVENOUS
  Filled 2013-12-02 (×8): qty 1

## 2013-12-02 NOTE — Progress Notes (Signed)
Patient ID: Kaitlyn Johnson, female   DOB: 12-12-1980, 33 y.o.   MRN: [id number]  [facility] PROGRESS NOTE  Trenee Igoe BMW:[id number] DOB: [birth year] DOA: 11/29/2013 PCP: Antony Blackbird, MD  Brief narrative: Patient is 33 year old female with, depression, still U. disease, von Willebrand's disease, Cushing's disease, multiple surgeries including Robaxin 1 procedure, cholecystectomy, abdominal hysterectomy, recently hospitalized for Alinda [place] virus gastroenteritis and now presented to [facility] emergency department every first 2015 with main concern of progressively worsening epigastric pain radiating to the back with nausea. On admission it was noted that lipase was elevated at 566.  Active Problems:   Nausea and vomiting - Etiology is relatively unclear, lipase is still elevated with no significant improvement over the past 24 hours - Patient still feels nauseated with abdominal pain persistently radiating to the back - Patient is currently on clear liquids and reports nausea - Will add Phenergan for refractory nausea and vomiting - Will ask GI for further assistance - Check lipase in the morning   Gastroenteritis due to norovirus - This was present on most recent admission, now resolved   Pancreatitis - Unclear etiology, management as noted above, GI consult requested   Hypertension - Reasonable inpatient control   Cushing's disease - Follows with Dr Cruzita Lederer as outpt. On hydrocortisone as outpt and will continue here    Hx of von willebrand ds  - stable. Follows with Dr Humphrey Rolls   Diet: Clear liquids  DVT prophylaxis: sq lovenox   Code Status: full code  Family Communication: Mother at bedside  Disposition Plan: home once improved   Consultants:  GI Procedures:   US Abdomen Complete  11/30/2013   Negative sonographic appearance of the abdomen; chronic cholecystectomy changes.  Antibiotics:  none  HPI/Subjective: No events overnight.   Objective: Filed  Vitals:   11/30/13 2116 12/01/13 0514 12/01/13 1400 12/01/13 2139  BP: 122/76 132/88 127/89 134/88  Pulse: 67 71 68 71  Temp: 98.5 F (36.9 C) 97.7 F (36.5 C) 97.9 F (36.6 C) 97.6 F (36.4 C)  TempSrc: Oral Oral Oral Oral  Resp: 16 18 18 18   Height:      Weight:      SpO2: 99% 94% 98% 100%    Intake/Output Summary (Last 24 hours) at 12/02/13 1016 Last data filed at 12/02/13 0600  Gross per 24 hour  Intake   3940 ml  Output    500 ml  Net   3440 ml    Exam:   General:  Pt is alert, follows commands appropriately, not in acute distress  Cardiovascular: Regular rate and rhythm, S1/S2, no murmurs, no rubs, no gallops  Respiratory: Clear to auscultation bilaterally, no wheezing, no crackles, no rhonchi  Abdomen: Soft, non tender, non distended, bowel sounds present, no guarding  Extremities: No edema, pulses DP and PT palpable bilaterally  Neuro: Grossly nonfocal  Data Reviewed: Basic Metabolic Panel:  Recent Labs Lab 11/26/13 0405 11/29/13 1915 11/30/13 0130 11/30/13 0210  NA 139 140  --  141  K 3.4* 4.3  --  4.1  CL 106 102  --  104  CO2 25 23  --  24  GLUCOSE 105* 104*  --  139*  BUN 3* 8  --  6  CREATININE 0.58 0.70 0.61 0.62  CALCIUM 8.2* 9.2  --  8.4  MG 2.0  --  1.8  --    Liver Function Tests:  Recent Labs Lab 11/29/13 1915 11/30/13 0210  AST 26 17  ALT 31  25  ALKPHOS 85 80  BILITOT <0.2* <0.2*  PROT 7.2 6.5  ALBUMIN 3.5 3.1*    Recent Labs Lab 11/29/13 1915 11/30/13 0210 12/01/13 0530 12/02/13 0513  LIPASE 566* 218* 134* 127*   CBC:  Recent Labs Lab 11/29/13 1915 11/30/13 0130 11/30/13 0210 12/01/13 0530  WBC 12.2* 11.7* 11.4* 8.7  NEUTROABS 9.0*  --   --   --   HGB 12.9 12.1 11.9* 12.7  HCT 39.2 36.5 36.5 38.1  MCV 101.6* 99.2 99.7 99.7  PLT 329 330 325 337   Recent Results (from the past 240 hour(s))  CLOSTRIDIUM DIFFICILE BY PCR     Status: None   Collection Time    11/23/13  5:41 PM      Result Value Range  Status   C difficile by pcr NEGATIVE  NEGATIVE Final   Comment: Performed at [place]     Status: None   Collection Time    11/24/13  3:39 PM      Result Value Range Status   Specimen Description STOOL   Final   Special Requests NONE   Final   Culture     Final   Value: NO SALMONELLA, SHIGELLA, CAMPYLOBACTER, YERSINIA, OR E.COLI 0157:H7 ISOLATED     Performed at [facility]   Report Status 11/28/2013 FINAL   Final  OVA AND PARASITE EXAMINATION     Status: None   Collection Time    11/24/13  3:39 PM      Result Value Range Status   Specimen Description STOOL   Final   Special Requests NONE   Final   Ova and parasites     Final   Value: NO OVA OR PARASITES SEEN     Performed at [facility]   Report Status 11/25/2013 FINAL   Final     Scheduled Meds: . diltiazem  180 mg Oral q morning - 10a  . enoxaparin (LOVENOX) injection  50 mg Subcutaneous Q24H  . hydrocortisone  10 mg Oral Q breakfast  . hydrocortisone  5 mg Oral Q supper  . Lurasidone HCl  20 mg Oral QPM  . metoprolol  75 mg Oral BID   Continuous Infusions: . sodium chloride 150 mL/hr at 12/01/13 0517     Faye Ramsay, MD  [facility] Pager [phone]  If 7PM-7AM, please contact night-coverage [email or web address] Password [email or web address] 12/02/2013, 10:16 AM   LOS: 3 days

## 2013-12-03 DIAGNOSIS — D638 Anemia in other chronic diseases classified elsewhere: Secondary | ICD-10-CM

## 2013-12-03 LAB — COMPREHENSIVE METABOLIC PANEL
ALT: 25 U/L (ref 0–35)
AST: 21 U/L (ref 0–37)
Albumin: 2.7 g/dL — ABNORMAL LOW (ref 3.5–5.2)
Alkaline Phosphatase: 82 U/L (ref 39–117)
BUN: <3 mg/dL — ABNORMAL LOW (ref 6–23)
CHLORIDE: 103 meq/L (ref 96–112)
CO2: 23 meq/L (ref 19–32)
Calcium: 7.8 mg/dL — ABNORMAL LOW (ref 8.4–10.5)
Creatinine, Ser: 0.62 mg/dL (ref 0.50–1.10)
GFR calc Af Amer: >90 mL/min (ref >90)
Glucose, Bld: 91 mg/dL (ref 70–99)
POTASSIUM: 3.1 meq/L — AB (ref 3.7–5.3)
Sodium: 141 mEq/L (ref 137–147)
Total Bilirubin: <0.2 mg/dL — ABNORMAL LOW (ref 0.3–1.2)
Total Protein: 5.7 g/dL — ABNORMAL LOW (ref 6.0–8.3)

## 2013-12-03 LAB — LIPASE, BLOOD: Lipase: 99 U/L — ABNORMAL HIGH (ref 11–59)

## 2013-12-03 LAB — CBC
HCT: 36 % (ref 36.0–46.0)
Hemoglobin: 11.8 g/dL — ABNORMAL LOW (ref 12.0–15.0)
MCH: 32.6 pg (ref 26.0–34.0)
MCHC: 32.8 g/dL (ref 30.0–36.0)
MCV: 99.4 fL (ref 78.0–100.0)
PLATELETS: 299 10*3/uL (ref 150–400)
RBC: 3.62 MIL/uL — AB (ref 3.87–5.11)
RDW: 12.4 % (ref 11.5–15.5)
WBC: 9.3 10*3/uL (ref 4.0–10.5)

## 2013-12-03 MED ORDER — POTASSIUM CHLORIDE CRYS ER 20 MEQ PO TBCR
40.0000 meq | EXTENDED_RELEASE_TABLET | Freq: Once | ORAL | Status: DC
  Filled 2013-12-03: qty 2

## 2013-12-03 MED ORDER — POTASSIUM CHLORIDE 10 MEQ/100ML IV SOLN
10.0000 meq | INTRAVENOUS | Status: AC
  Administered 2013-12-03 – 2013-12-04 (×6): 10 meq via INTRAVENOUS
  Filled 2013-12-03 (×6): qty 100

## 2013-12-03 NOTE — Consult Note (Signed)
Referring Provider: No ref. provider found Primary Care Physician:  Cain SaupeFULP, CAMMIE, MD Primary Gastroenterologist:  Dr. Kinnie ScalesMedoff  Reason for Consultation:  Pancreatitis  HPI: Kaitlyn Johnson is a 33 y.o. female well known to me with a variety of GI and non-GI diagnosis.  Recent admission with suspected viral gastroenteritis.  Symptoms resolved with supportive therapy and she was discharged home.  Several days later she again became dehydrated with nausea, vomiting and light headedness.  Seen at urgent care where elevated lipase prompted re-admission with presumptive diagnosis of pancreatitis.  Abdominal pain has been improving, characterized as LUQ radiating into the left flank.  Nausea is also improving and vomiting has resolved.  Today she began experiencing diarrhea with 8-10 watery stools, similar to her earlier admission for viral gastroenteritis.  Pertinent to her presentation was the rapid reduction in steroid dose today, from stress coverage of hydrocortisone 100 mg daily since admission to prednisone 20mg  qAM and 10 mg qPM.   Past Medical History  Diagnosis Date  . Von Willebrand disease     bruising easy  . Anemia 11/26/2011  . POTS (postural orthostatic tachycardia syndrome) 11/26/2011  . POTS (postural orthostatic tachycardia syndrome)   . Asthma   . Anxiety   . Depression   . H/O hiatal hernia   . Celiac and mesenteric artery injury   . Celiac and mesenteric artery injury     Past Surgical History  Procedure Laterality Date  . Abdominal hysterectomy    . Laparoscopic endometriosis fulguration    . Roux-en-y procedure    . Cholecystectomy    . Anterior cervical decomp/discectomy fusion N/A 03/31/2013    Procedure: ANTERIOR CERVICAL DECOMPRESSION/DISCECTOMY FUSION 1 LEVEL Cervical five-six;  Surgeon: Reinaldo Meekerandy O Kritzer, MD;  Location: [facility];  Service: Neurosurgery;  Laterality: N/A;    Prior to Admission medications   Medication Sig Start Date End Date Taking?  Authorizing Provider  diltiazem (DILACOR XR) 180 MG 24 hr capsule Take 180 mg by mouth every morning.    Yes Historical Provider, MD  DULoxetine (CYMBALTA) 60 MG capsule Take 60 mg by mouth daily.   Yes Historical Provider, MD  EPINEPHrine (EPIPEN) 0.3 mg/0.3 mL SOAJ injection Inject 0.3 mg into the muscle once as needed (severe allergic reaction).   Yes Historical Provider, MD  hydrocortisone (CORTEF) 5 MG tablet Take 5-10 mg by mouth 2 (two) times daily. Take 10mg  in the morning and 5mg  in the afternoon   Yes Historical Provider, MD  hydrocortisone sodium succinate (SOLU-CORTEF) 100 mg/2 mL injection Inject 100 mg in the muscle over 30 seconds if you cannot take hydrocortisone by mouth 10/01/13  Yes Carlus Pavlovristina Gherghe, MD  Lurasidone HCl (LATUDA) 20 MG TABS Take 20 mg by mouth every evening.    Yes Historical Provider, MD  metoprolol (LOPRESSOR) 50 MG tablet Take 75 mg by mouth 2 (two) times daily.   Yes Historical Provider, MD  montelukast (SINGULAIR) 10 MG tablet Take 10 mg by mouth at bedtime.   Yes Historical Provider, MD  ondansetron (ZOFRAN-ODT) 4 MG disintegrating tablet Take 4-8 mg by mouth every 8 (eight) hours as needed for nausea or vomiting.   Yes Historical Provider, MD  zolpidem (AMBIEN) 10 MG tablet Take 10 mg by mouth at bedtime.   Yes Historical Provider, MD  clonazePAM (KLONOPIN) 1 MG tablet Take 1 mg by mouth 2 (two) times daily as needed for anxiety.     Historical Provider, MD    Current Facility-Administered Medications  Medication Dose  Route Frequency Provider Last Rate Last Dose  . 0.9 %  sodium chloride infusion   Intravenous Continuous Dorothea Ogle, MD 75 mL/hr at 12/03/13 1429    . acetaminophen (TYLENOL) tablet 650 mg  650 mg Oral Q6H PRN Jeralyn Bennett, MD   650 mg at 12/01/13 0540   Or  . acetaminophen (TYLENOL) suppository 650 mg  650 mg Rectal Q6H PRN Jeralyn Bennett, MD      . albuterol (PROVENTIL) (2.5 MG/3ML) 0.083% nebulizer solution 2.5 mg  2.5 mg Inhalation  Q6H PRN Jeralyn Bennett, MD      . alum & mag hydroxide-simeth (MAALOX/MYLANTA) 200-200-20 MG/5ML suspension 30 mL  30 mL Oral Q6H PRN Jeralyn Bennett, MD      . diltiazem (DILACOR XR) 24 hr capsule 180 mg  180 mg Oral q morning - 10a Jeralyn Bennett, MD   180 mg at 12/03/13 1028  . enoxaparin (LOVENOX) injection 50 mg  50 mg Subcutaneous Q24H Jeralyn Bennett, MD   50 mg at 12/03/13 1027  . hydrocortisone (CORTEF) tablet 10 mg  10 mg Oral Q breakfast Nishant Dhungel, MD   10 mg at 12/03/13 0844  . hydrocortisone (CORTEF) tablet 5 mg  5 mg Oral Q supper Nishant Dhungel, MD   5 mg at 12/03/13 1720  . HYDROmorphone (DILAUDID) injection 1 mg  1 mg Intravenous Q4H PRN Dorothea Ogle, MD   1 mg at 12/03/13 1416  . LORazepam (ATIVAN) injection 1 mg  1 mg Intravenous Q6H PRN Jeralyn Bennett, MD   1 mg at 12/03/13 1528  . Lurasidone HCl TABS 20 mg  20 mg Oral QPM Jeralyn Bennett, MD   20 mg at 12/03/13 1721  . metoprolol tartrate (LOPRESSOR) tablet 75 mg  75 mg Oral BID Jeralyn Bennett, MD   75 mg at 12/03/13 1028  . ondansetron (ZOFRAN) tablet 4 mg  4 mg Oral Q6H PRN Jeralyn Bennett, MD       Or  . ondansetron (ZOFRAN) injection 4 mg  4 mg Intravenous Q6H PRN Jeralyn Bennett, MD   4 mg at 12/03/13 1819  . oxyCODONE (Oxy IR/ROXICODONE) immediate release tablet 5 mg  5 mg Oral Q4H PRN Jeralyn Bennett, MD   5 mg at 12/02/13 1400  . potassium chloride 10 mEq in 100 mL IVPB  10 mEq Intravenous Q1 Hr x 6 Dorothea Ogle, MD   10 mEq at 12/03/13 1825  . promethazine (PHENERGAN) injection 12.5 mg  12.5 mg Intravenous Q6H PRN Dorothea Ogle, MD   12.5 mg at 12/03/13 0451    Allergies as of 11/29/2013 - Review Complete 11/29/2013  Allergen Reaction Noted  . Shellfish allergy Anaphylaxis 06/02/2012  . Reglan [metoclopramide] Other (See Comments) 06/02/2012  . Wheat bran Other (See Comments) 06/02/2012  . Sulfa antibiotics Rash 06/02/2012  . Tartrazine Rash 06/02/2012    Family History  Problem Relation Age of  Onset  . Hypertension Mother     History   Social History  . Marital Status: Married    Spouse Name: N/A    Number of Children: N/A  . Years of Education: N/A   Occupational History  . Not on file.   Social History Main Topics  . Smoking status: Never Smoker   . Smokeless tobacco: Never Used  . Alcohol Use: 3.0 oz/week    5 Glasses of wine per week  . Drug Use: No  . Sexual Activity: Yes    Partners: Male    Birth Control/ Protection:  None, Surgical   Other Topics Concern  . Not on file   Social History Narrative   Regular exercise: can't at this time   Caffeine use: no    Review of Systems: Positive for:   Gen: facial fullness,;   Denies any fever, chills, rigors, night sweats, anorexia, fatigue, weakness, malaise, involuntary weight loss, and sleep disorder CV:  Denies chest pain, angina, palpitations, syncope, orthopnea, PND, peripheral edema, and claudication. Resp: Denies dyspnea, cough, sputum, wheezing, coughing up blood. GI: nausea, abdominal pain, diarrhea;   Denies dysphagia, odynophagia,vomiting, vomiting blood, jaundice, black stools, rectal bleeding, constipation, and fecal incontinence.    GU : Denies urinary burning, blood in urine, urinary frequency, urinary hesitancy, nocturnal urination, and urinary incontinence. MS: Denies joint pain or swelling.  Denies muscle weakness, cramps, atrophy.  Derm:  erythematous scaling skin;   Denies itching, oral ulcerations, hives, unhealing ulcers.  Psych: Denies depression, anxiety, memory loss, suicidal ideation, hallucinations,  and confusion. Heme: Denies bruising, bleeding, and enlarged lymph nodes. Neuro:  Denies any headaches, dizziness, paresthesias. Endo:  Denies any problems with heat or cold intolerance, polydipsia/polyuria, unusual weight gain.  Physical Exam: Vital signs in last 24 hours: Temp:  [97.6 F (36.4 C)-98.6 F (37 C)] 97.6 F (36.4 C) (02/05 1500) Pulse Rate:  [74-105] 84 (02/05  1500) Resp:  [18-20] 20 (02/05 1500) BP: (126-142)/(81-92) 142/90 mmHg (02/05 1500) SpO2:  [96 %-100 %] 99 % (02/05 1500) Weight:  [102.967 kg (227 lb)] 102.967 kg (227 lb) (02/05 1259) Last BM Date: 11/29/13 General:   Alert,  Well-developed, well-nourished, pleasant and cooperative in NAD Head: Cushingoid facies; Normocephalic and atraumatic. Eyes:  Sclera clear, no icterus.   Conjunctiva pink.   Mouth:   No ulcerations or lesions.  Oropharynx pink & moist. Neck:   No masses or thyromegaly. Lungs:  Clear throughout to auscultation.   No wheezes, crackles, or rhonchi. No evident respiratory distress. Heart:   Regular rate and rhythm; no murmurs, clicks, rubs,  or gallops. Abdomen: Tender diffusely, mild; Soft, nontympanitic, and nondistended. No masses, hepatosplenomegaly or ventral hernias noted. Normal bowel sounds, without bruits, guarding, or rebound.   Rectal:  Not done   Msk:   Symmetrical without gross deformities. Pulses:  Normal pulses noted. Extremities:   Without clubbing, cyanosis, or edema. Neurologic:  Alert and coherent;  grossly normal neurologically. Skin: facial scaling, erythema;  Intact without significant lesions Cervical Nodes:  No significant cervical adenopathy. Psych:   Alert and cooperative. Normal mood and affect.  Intake/Output from previous day: 02/04 0701 - 02/05 0700 In: 4075 [P.O.:480; I.V.:3595] Out: -  Intake/Output this shift:    Lab Results:  Recent Labs  12/01/13 0530 12/03/13 0500  WBC 8.7 9.3  HGB 12.7 11.8*  HCT 38.1 36.0  PLT 337 299   BMET  Recent Labs  12/03/13 0500  NA 141  K 3.1*  CL 103  CO2 23  GLUCOSE 91  BUN <3*  CREATININE 0.62  CALCIUM 7.8*   LFT  Recent Labs  12/03/13 0500  PROT 5.7*  ALBUMIN 2.7*  AST 21  ALT 25  ALKPHOS 82  BILITOT <0.2*   PT/INR No results found for this basename: LABPROT, INR,  in the last 72 hours  C-Diff negative  Studies/Results: No results  found.  Impression: The diagnosis of pancreatitis is supported by her presentation with abdominal pain, nausea and vomiting, leucocytosis, and elevated lipase.  Etiology is not so obvious.  The only medication she is on which  has been implicated in pancreatitis is singulair and steroids.  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  A stricture at the sphincterotomy site is possible, but unlikely without dilatation of the pancreatic and/or common bile duct.  Obstructing ductal stone would also be expected to cause LFT and pancreatic enzyme elevation if this were the cause.  The reading of 'intra-hepatic ductal dilatation' is not normal and should be reviewed with the radiologist.    I think an alternative explanation for the patient's presentation might be an elevated lipase secondary to the violent vomiting and retching she experienced during her viral illness.  This has been gradually resolving.  The diarrhea today is reminiscent of the response she has had to decreasing her steroids previously.    Plan: 1.  Increase prednisone 20mg  BID (or 30/15 mg) 2.  If diarrhea and abdominal pain do not improve within 24 hours of increasing steroids, then obtain stool studies:  Repeat C. Diff, lactoferrin, C/S, EIA: giardia, microsporidia, cryptosporidium 3.  Abdominal CT - pancreatic protocol (collect stool specimens before giving CT contrast) 4.  Advance diet gradually as tolerated 5.  Further evaluation may require MRCP, ERCP 6.  I will be available on my cell # [phone]   LOS: 4 days   Kaitlyn Johnson  12/03/2013, 7:08 PM

## 2013-12-03 NOTE — Progress Notes (Signed)
Patient ID: Kaitlyn Johnson, female   DOB: 01/20/1981, 33 y.o.   MRN: [id number] [facility] PROGRESS NOTE  Kaitlyn GuntherKendall Bennett Johnson NWG:[id number] DOB: 01/20/1981 DOA: 11/29/2013 PCP: Cain SaupeFULP, CAMMIE, MD  Brief narrative:  Patient is 33 year old female with, depression, still U. disease, von Willebrand's disease, Cushing's disease, multiple surgeries including Robaxin 1 procedure, cholecystectomy, abdominal hysterectomy, recently hospitalized for [place] virus gastroenteritis and now [facility] every first 2015 with main concern of progressively worsening epigastric pain radiating to the back with nausea. On admission it was noted that lipase was elevated at 566.   Active Problems:  Nausea and vomiting  - Etiology is relatively unclear, lipase is trending down and pt is feeling better this AM  - will plan on advancing diet as pt is already tolerating current clear liquid diet  - continue Phenergan for refractory nausea and vomiting  - Will ask GI for further assistance  - Check lipase in the morning  Gastroenteritis due to norovirus  - This was present on most recent admission, now resolved  - stool checked for O&P, negative  Pancreatitis  - Unclear etiology, management as noted above, GI consult requested  Hypertension  - Reasonable inpatient control  Cushing's disease  - Follows with Dr Elvera LennoxGherghe as outpt. On hydrocortisone as outpt and will continue here  Hx of von willebrand ds  - stable. Follows with Dr Welton FlakesKhan  Hypokalemia - mild, will supplement and repeat BMP in AM  Diet: advance to regular diet  DVT prophylaxis: sq lovenox   Code Status: full code  Family Communication: Mother at bedside  Disposition Plan: home once improved   Consultants:   GI Procedures:   [place] Abdomen Complete 11/30/2013 Negative sonographic appearance of the abdomen; chronic cholecystectomy changes.  Antibiotics:   None   HPI/Subjective: No events overnight.    Objective: Filed Vitals:   12/02/13 2110 12/03/13 0613 12/03/13 1028 12/03/13 1259  BP: 138/91 126/81 130/92   Pulse: 74 78 105   Temp: 98.6 F (37 C) 98.6 F (37 C)    TempSrc: Oral Oral    Resp: 18 18    Height:      Weight:    102.967 kg (227 lb)  SpO2: 100% 96%      Intake/Output Summary (Last 24 hours) at 12/03/13 1424 Last data filed at 12/03/13 1400  Gross per 24 hour  Intake   5040 ml  Output      0 ml  Net   5040 ml    Exam:   General:  Pt is alert, follows commands appropriately, not in acute distress  Cardiovascular: Regular rate and rhythm, S1/S2, no murmurs, no rubs, no gallops  Respiratory: Clear to auscultation bilaterally, no wheezing, no crackles, no rhonchi  Abdomen: Soft, non tender, non distended, bowel sounds present, no guarding  Extremities: No edema, pulses DP and PT palpable bilaterally  Neuro: Grossly nonfocal  Data Reviewed: Basic Metabolic Panel:  Recent Labs Lab 11/29/13 1915 11/30/13 0130 11/30/13 0210 12/03/13 0500  NA 140  --  141 141  K 4.3  --  4.1 3.1*  CL 102  --  104 103  CO2 23  --  24 23  GLUCOSE 104*  --  139* 91  BUN 8  --  6 <3*  CREATININE 0.70 0.61 0.62 0.62  CALCIUM 9.2  --  8.4 7.8*  MG  --  1.8  --   --    Liver Function Tests:  Recent Labs  Lab 11/29/13 1915 11/30/13 0210 12/03/13 0500  AST 26 17 21   ALT 31 25 25   ALKPHOS 85 80 82  BILITOT <0.2* <0.2* <0.2*  PROT 7.2 6.5 5.7*  ALBUMIN 3.5 3.1* 2.7*    Recent Labs Lab 11/29/13 1915 11/30/13 0210 12/01/13 0530 12/02/13 0513 12/03/13 0500  LIPASE 566* 218* 134* 127* 99*   CBC:  Recent Labs Lab 11/29/13 1915 11/30/13 0130 11/30/13 0210 12/01/13 0530 12/03/13 0500  WBC 12.2* 11.7* 11.4* 8.7 9.3  NEUTROABS 9.0*  --   --   --   --   HGB 12.9 12.1 11.9* 12.7 11.8*  HCT 39.2 36.5 36.5 38.1 36.0  MCV 101.6* 99.2 99.7 99.7 99.4  PLT 329 330 325 337 299   Recent Results (from the past 240 hour(s))  CLOSTRIDIUM DIFFICILE BY PCR      Status: None   Collection Time    11/23/13  5:41 PM      Result Value Range Status   C difficile by pcr NEGATIVE  NEGATIVE Final   Comment: Performed at [facility]  STOOL CULTURE     Status: None   Collection Time    11/24/13  3:39 PM      Result Value Range Status   Specimen Description STOOL   Final   Special Requests NONE   Final   Culture     Final   Value: NO SALMONELLA, SHIGELLA, CAMPYLOBACTER, YERSINIA, OR E.COLI 0157:H7 ISOLATED     Performed at [facility]   Report Status 11/28/2013 FINAL   Final  OVA AND PARASITE EXAMINATION     Status: None   Collection Time    11/24/13  3:39 PM      Result Value Range Status   Specimen Description STOOL   Final   Special Requests NONE   Final   Ova and parasites     Final   Value: NO OVA OR PARASITES SEEN     Performed at [facility]   Report Status 11/25/2013 FINAL   Final     Scheduled Meds: . diltiazem  180 mg Oral q morning - 10a  . enoxaparin (LOVENOX) injection  50 mg Subcutaneous Q24H  . hydrocortisone  10 mg Oral Q breakfast  . hydrocortisone  5 mg Oral Q supper  . Lurasidone HCl  20 mg Oral QPM  . metoprolol  75 mg Oral BID   Continuous Infusions: . sodium chloride 150 mL/hr at 12/03/13 1151   Debbora Presto, MD  [facility] Pager [phone]  If 7PM-7AM, please contact night-coverage [email or web address] Password [email or web address] 12/03/2013, 2:24 PM   LOS: 4 days

## 2013-12-04 LAB — LIPASE, BLOOD: Lipase: 85 U/L — ABNORMAL HIGH (ref 11–59)

## 2013-12-04 LAB — BASIC METABOLIC PANEL
CALCIUM: 8.4 mg/dL (ref 8.4–10.5)
CO2: 25 mEq/L (ref 19–32)
Chloride: 102 mEq/L (ref 96–112)
Creatinine, Ser: 0.62 mg/dL (ref 0.50–1.10)
Glucose, Bld: 98 mg/dL (ref 70–99)
POTASSIUM: 3.7 meq/L (ref 3.7–5.3)
Sodium: 140 mEq/L (ref 137–147)

## 2013-12-04 LAB — CBC
HCT: 35.8 % — ABNORMAL LOW (ref 36.0–46.0)
Hemoglobin: 11.9 g/dL — ABNORMAL LOW (ref 12.0–15.0)
MCH: 32.7 pg (ref 26.0–34.0)
MCHC: 33.2 g/dL (ref 30.0–36.0)
MCV: 98.4 fL (ref 78.0–100.0)
Platelets: 321 K/uL (ref 150–400)
RBC: 3.64 MIL/uL — ABNORMAL LOW (ref 3.87–5.11)
RDW: 12.2 % (ref 11.5–15.5)
WBC: 9.9 K/uL (ref 4.0–10.5)

## 2013-12-04 NOTE — Progress Notes (Signed)
Patient ID: Kaitlyn Johnson, female   DOB: 08/04/1981, 33 y.o.   MRN: [id number]  [facility] PROGRESS NOTE  Scotlynn Noyes WUJ:[id number] DOB: 1981-01-07 DOA: 11/29/2013 PCP: Cain Saupe, MD  Brief narrative:  Patient is 33 year old female with, depression, still U. disease, von Willebrand's disease, Cushing's disease, multiple surgeries including Robaxin 1 procedure, cholecystectomy, abdominal hysterectomy, recently hospitalized for Arna Medici virus gastroenteritis and now presented to [facility] long emergency department every first 2015 with main concern of progressively worsening epigastric pain radiating to the back with nausea. On admission it was noted that lipase was elevated at 566.   Active Problems:  Nausea and vomiting  - Etiology is relatively unclear, lipase is trending down and pt is feeling better this AM  - tolerating diet well  - continue Phenergan for refractory nausea and vomiting  - appreciate GI assistance  - Check lipase in the morning  Gastroenteritis due to norovirus with diarrhea  - This was present on most recent admission, now resolved  - stool checked for O&P, negative  - will repeat stool studies as per GI recommendations: stool O&P, culture, giardia/cryptospridium, lactoferrin Pancreatitis  - Unclear etiology, management as noted above, GI consult requested  Hypertension  - Reasonable inpatient control  Cushing's disease  - Follows with Dr Elvera Lennox as outpt. On hydrocortisone as outpt and will continue here  Hx of von willebrand ds  - stable. Follows with Dr Welton Flakes  Hypokalemia  - supplemented and WNL this AM  Diet: advance to regular diet  DVT prophylaxis: sq lovenox   Code Status: full code  Family Communication: Mother at bedside  Disposition Plan: home once improved   Consultants:  GI Procedures:  US Abdomen Complete 11/30/2013 Negative sonographic appearance of the abdomen; chronic cholecystectomy changes.  Antibiotics:  None    HPI/Subjective: No events overnight.   Objective: Filed Vitals:   12/03/13 1259 12/03/13 1500 12/03/13 2136 12/04/13 0542  BP:  142/90 118/84 120/68  Pulse:  84 92 71  Temp:  97.6 F (36.4 C) 97.9 F (36.6 C) 97.6 F (36.4 C)  TempSrc:  Oral Oral Oral  Resp:  20 18 16   Height:      Weight: 102.967 kg (227 lb)   102.195 kg (225 lb 4.8 oz)  SpO2:  99% 96% 95%    Intake/Output Summary (Last 24 hours) at 12/04/13 0945 Last data filed at 12/04/13 0900  Gross per 24 hour  Intake   3315 ml  Output    400 ml  Net   2915 ml    Exam:   General:  Pt is alert, follows commands appropriately, not in acute distress  Cardiovascular: Regular rate and rhythm, S1/S2, no murmurs, no rubs, no gallops  Respiratory: Clear to auscultation bilaterally, no wheezing, no crackles, no rhonchi  Abdomen: Soft, non tender, non distended, bowel sounds present, no guarding  Extremities: No edema, pulses DP and PT palpable bilaterally  Neuro: Grossly nonfocal  Data Reviewed: Basic Metabolic Panel:  Recent Labs Lab 11/29/13 1915 11/30/13 0130 11/30/13 0210 12/03/13 0500 12/04/13 0445  NA 140  --  141 141 140  K 4.3  --  4.1 3.1* 3.7  CL 102  --  104 103 102  CO2 23  --  24 23 25   GLUCOSE 104*  --  139* 91 98  BUN 8  --  6 <3* <3*  CREATININE 0.70 0.61 0.62 0.62 0.62  CALCIUM 9.2  --  8.4 7.8* 8.4  MG  --  1.8  --   --   --  Liver Function Tests:  Recent Labs Lab 11/29/13 1915 11/30/13 0210 12/03/13 0500  AST 26 17 21   ALT 31 25 25   ALKPHOS 85 80 82  BILITOT <0.2* <0.2* <0.2*  PROT 7.2 6.5 5.7*  ALBUMIN 3.5 3.1* 2.7*    Recent Labs Lab 11/30/13 0210 12/01/13 0530 12/02/13 0513 12/03/13 0500 12/04/13 0445  LIPASE 218* 134* 127* 99* 85*   CBC:  Recent Labs Lab 11/29/13 1915 11/30/13 0130 11/30/13 0210 12/01/13 0530 12/03/13 0500 12/04/13 0445  WBC 12.2* 11.7* 11.4* 8.7 9.3 9.9  NEUTROABS 9.0*  --   --   --   --   --   HGB 12.9 12.1 11.9* 12.7 11.8*  11.9*  HCT 39.2 36.5 36.5 38.1 36.0 35.8*  MCV 101.6* 99.2 99.7 99.7 99.4 98.4  PLT 329 330 325 337 299 321   Recent Results (from the past 240 hour(s))  STOOL CULTURE     Status: None   Collection Time    11/24/13  3:39 PM      Result Value Range Status   Specimen Description STOOL   Final   Special Requests NONE   Final   Culture     Final   Value: NO SALMONELLA, SHIGELLA, CAMPYLOBACTER, YERSINIA, OR E.COLI 0157:H7 ISOLATED     Performed at [facility]   Report Status 11/28/2013 FINAL   Final  OVA AND PARASITE EXAMINATION     Status: None   Collection Time    11/24/13  3:39 PM      Result Value Range Status   Specimen Description STOOL   Final   Special Requests NONE   Final   Ova and parasites     Final   Value: NO OVA OR PARASITES SEEN     Performed at [facility]   Report Status 11/25/2013 FINAL   Final     Scheduled Meds: . diltiazem  180 mg Oral q morning - 10a  . enoxaparin (LOVENOX) injection  50 mg Subcutaneous Q24H  . hydrocortisone  10 mg Oral Q breakfast  . hydrocortisone  5 mg Oral Q supper  . Lurasidone HCl  20 mg Oral QPM  . metoprolol  75 mg Oral BID   Continuous Infusions: . sodium chloride 75 mL/hr at 12/04/13 [id number]     Debbora PrestoMAGICK-Tijuana Scheidegger, MD  [facility] Pager [phone]  If 7PM-7AM, please contact night-coverage [email or web address] Password [email or web address] 12/04/2013, 9:45 AM   LOS: 5 days

## 2013-12-05 ENCOUNTER — Inpatient Hospital Stay (HOSPITAL_COMMUNITY): Payer: BC Managed Care – PPO

## 2013-12-05 LAB — BASIC METABOLIC PANEL
CHLORIDE: 101 meq/L (ref 96–112)
CO2: 26 meq/L (ref 19–32)
CREATININE: 0.68 mg/dL (ref 0.50–1.10)
Calcium: 8.4 mg/dL (ref 8.4–10.5)
GFR calc Af Amer: >90 mL/min (ref >90)
GFR calc non Af Amer: >90 mL/min (ref >90)
GLUCOSE: 108 mg/dL — AB (ref 70–99)
POTASSIUM: 3.4 meq/L — AB (ref 3.7–5.3)
Sodium: 139 mEq/L (ref 137–147)

## 2013-12-05 LAB — CBC
HEMATOCRIT: 37.2 % (ref 36.0–46.0)
HEMOGLOBIN: 12.2 g/dL (ref 12.0–15.0)
MCH: 32.5 pg (ref 26.0–34.0)
MCHC: 32.8 g/dL (ref 30.0–36.0)
MCV: 99.2 fL (ref 78.0–100.0)
Platelets: 303 10*3/uL (ref 150–400)
RBC: 3.75 MIL/uL — AB (ref 3.87–5.11)
RDW: 12.3 % (ref 11.5–15.5)
WBC: 10.4 10*3/uL (ref 4.0–10.5)

## 2013-12-05 LAB — FECAL LACTOFERRIN, QUANT: FECAL LACTOFERRIN: NEGATIVE

## 2013-12-05 LAB — CLOSTRIDIUM DIFFICILE BY PCR: Toxigenic C. Difficile by PCR: NEGATIVE

## 2013-12-05 LAB — LIPASE, BLOOD: Lipase: 93 U/L — ABNORMAL HIGH (ref 11–59)

## 2013-12-05 MED ORDER — POTASSIUM CHLORIDE 10 MEQ/100ML IV SOLN
10.0000 meq | INTRAVENOUS | Status: AC
  Administered 2013-12-05 (×3): 10 meq via INTRAVENOUS
  Filled 2013-12-05 (×3): qty 100

## 2013-12-05 MED ORDER — IOHEXOL 300 MG/ML  SOLN
100.0000 mL | Freq: Once | INTRAMUSCULAR | Status: AC | PRN
  Administered 2013-12-05: 100 mL via INTRAVENOUS

## 2013-12-05 MED ORDER — IOHEXOL 300 MG/ML  SOLN
50.0000 mL | Freq: Once | INTRAMUSCULAR | Status: AC | PRN
  Administered 2013-12-05: 50 mL via ORAL

## 2013-12-05 NOTE — Progress Notes (Signed)
Patient ID: Kaitlyn Johnson, female   DOB: 06/06/1981, 33 y.o.   MRN: [id number]  [facility] PROGRESS NOTE  Lennan Malone WUJ:[id number] DOB: September 07, 1981 DOA: 11/29/2013 PCP: Cain Saupe, MD  Brief narrative:  Patient is 33 year old female with, depression, still U. disease, von Willebrand's disease, Cushing's disease, multiple surgeries including Robaxin 1 procedure, cholecystectomy, abdominal hysterectomy, recently hospitalized for Arna Medici virus gastroenteritis and now presented to [facility] emergency department every first 2015 with main concern of progressively worsening epigastric pain radiating to the back with nausea. On admission it was noted that lipase was elevated at 566.   Active Problems:  Nausea and vomiting  - Etiology is relatively unclear, lipase is up from yesterday - tolerating diet well with no vomiting  - continue Phenergan for refractory nausea and vomiting  - appreciate GI assistance  - Check lipase in the morning  - obtain CT abd and pelvis with contrast  Gastroenteritis due to norovirus with diarrhea  - This was present on most recent admission, now resolved  - stool checked for O&P, negative  - repeat stool studies as per GI recommendations: stool O&P, culture, giardia/cryptospridium, lactoferrin --> pending this AM - CT abd as noted above  Pancreatitis  - Unclear etiology, management as noted above - check lipase in AM Hypertension  - Reasonable inpatient control  Cushing's disease  - Follows with Dr Elvera Lennox as outpt. On hydrocortisone as outpt and will continue here  Hx of von willebrand ds  - stable. Follows with Dr Welton Flakes  Hypokalemia  - supplement via IV this AM  Diet: regular diet  DVT prophylaxis: sq lovenox   Code Status: full code  Family Communication: Mother at bedside  Disposition Plan: home once improved   Consultants:  GI Procedures:  US Abdomen Complete 11/30/2013 Negative sonographic appearance of the abdomen;  chronic cholecystectomy changes.  Antibiotics:  None   HPI/Subjective: No events overnight.   Objective: Filed Vitals:   12/04/13 1340 12/04/13 2131 12/05/13 0607 12/05/13 0826  BP: 118/80 142/84 106/69   Pulse: 81 84 79   Temp: 97.7 F (36.5 C) 99.8 F (37.7 C) 97.9 F (36.6 C)   TempSrc: Oral Oral Oral   Resp: 16     Height:      Weight:    101.515 kg (223 lb 12.8 oz)  SpO2: 98% 99% 96%     Intake/Output Summary (Last 24 hours) at 12/05/13 0959 Last data filed at 12/04/13 1900  Gross per 24 hour  Intake 886.25 ml  Output    951 ml  Net -64.75 ml    Exam:   General:  Pt is alert, follows commands appropriately, not in acute distress  Cardiovascular: Regular rate and rhythm, S1/S2, no murmurs, no rubs, no gallops  Respiratory: Clear to auscultation bilaterally, no wheezing, no crackles, no rhonchi  Abdomen: Soft, tender in upper abd quadrants, non distended, bowel sounds present, no guarding  Extremities: No edema, pulses DP and PT palpable bilaterally  Neuro: Grossly nonfocal  Data Reviewed: Basic Metabolic Panel:  Recent Labs Lab 11/29/13 1915 11/30/13 0130 11/30/13 0210 12/03/13 0500 12/04/13 0445 12/05/13 0510  NA 140  --  141 141 140 139  K 4.3  --  4.1 3.1* 3.7 3.4*  CL 102  --  104 103 102 101  CO2 23  --  24 23 25 26   GLUCOSE 104*  --  139* 91 98 108*  BUN 8  --  6 <3* <3* <3*  CREATININE 0.70 0.61  0.62 0.62 0.62 0.68  CALCIUM 9.2  --  8.4 7.8* 8.4 8.4  MG  --  1.8  --   --   --   --    Liver Function Tests:  Recent Labs Lab 11/29/13 1915 11/30/13 0210 12/03/13 0500  AST 26 17 21   ALT 31 25 25   ALKPHOS 85 80 82  BILITOT <0.2* <0.2* <0.2*  PROT 7.2 6.5 5.7*  ALBUMIN 3.5 3.1* 2.7*    Recent Labs Lab 12/01/13 0530 12/02/13 0513 12/03/13 0500 12/04/13 0445 12/05/13 0510  LIPASE 134* 127* 99* 85* 93*   CBC:  Recent Labs Lab 11/29/13 1915  11/30/13 0210 12/01/13 0530 12/03/13 0500 12/04/13 0445 12/05/13 0510  WBC  12.2*  < > 11.4* 8.7 9.3 9.9 10.4  NEUTROABS 9.0*  --   --   --   --   --   --   HGB 12.9  < > 11.9* 12.7 11.8* 11.9* 12.2  HCT 39.2  < > 36.5 38.1 36.0 35.8* 37.2  MCV 101.6*  < > 99.7 99.7 99.4 98.4 99.2  PLT 329  < > 325 337 299 321 303  < > = values in this interval not displayed.  Scheduled Meds: . diltiazem  180 mg Oral q morning - 10a  . enoxaparin (LOVENOX) injection  50 mg Subcutaneous Q24H  . hydrocortisone  10 mg Oral Q breakfast  . hydrocortisone  5 mg Oral Q supper  . Lurasidone HCl  20 mg Oral QPM  . metoprolol  75 mg Oral BID   Continuous Infusions: . sodium chloride 75 mL/hr at 12/04/13 2135     Debbora PrestoMAGICK-Finley Chevez, MD  [facility] Pager [phone]  If 7PM-7AM, please contact night-coverage [email or web address] Password [email or web address] 12/05/2013, 9:59 AM   LOS: 6 days

## 2013-12-05 NOTE — Progress Notes (Signed)
Pt has slept all night.  Medicate earlier in shift for pain and she has slept for the rest of the night.  No c/o pain or distress.  No N/V

## 2013-12-06 LAB — CBC
HEMATOCRIT: 38.9 % (ref 36.0–46.0)
Hemoglobin: 13 g/dL (ref 12.0–15.0)
MCH: 33.2 pg (ref 26.0–34.0)
MCHC: 33.4 g/dL (ref 30.0–36.0)
MCV: 99.2 fL (ref 78.0–100.0)
Platelets: 307 10*3/uL (ref 150–400)
RBC: 3.92 MIL/uL (ref 3.87–5.11)
RDW: 12.3 % (ref 11.5–15.5)
WBC: 10.5 10*3/uL (ref 4.0–10.5)

## 2013-12-06 LAB — COMPREHENSIVE METABOLIC PANEL
ALK PHOS: 97 U/L (ref 39–117)
ALT: 32 U/L (ref 0–35)
AST: 17 U/L (ref 0–37)
Albumin: 3.2 g/dL — ABNORMAL LOW (ref 3.5–5.2)
CALCIUM: 9 mg/dL (ref 8.4–10.5)
CO2: 25 mEq/L (ref 19–32)
Chloride: 100 mEq/L (ref 96–112)
Creatinine, Ser: 0.64 mg/dL (ref 0.50–1.10)
GFR calc Af Amer: >90 mL/min (ref >90)
Glucose, Bld: 99 mg/dL (ref 70–99)
Potassium: 4 mEq/L (ref 3.7–5.3)
SODIUM: 140 meq/L (ref 137–147)
TOTAL PROTEIN: 7 g/dL (ref 6.0–8.3)
Total Bilirubin: <0.2 mg/dL — ABNORMAL LOW (ref 0.3–1.2)

## 2013-12-06 LAB — LIPASE, BLOOD: LIPASE: 95 U/L — AB (ref 11–59)

## 2013-12-06 MED ORDER — PANCRELIPASE (LIP-PROT-AMYL) 12000-38000 UNITS PO CPEP
2.0000 | ORAL_CAPSULE | Freq: Three times a day (TID) | ORAL | Status: DC
  Administered 2013-12-06 – 2013-12-07 (×2): 2 via ORAL
  Filled 2013-12-06 (×5): qty 2

## 2013-12-06 NOTE — Progress Notes (Signed)
Patient ID: Kaitlyn Johnson, female   DOB: 1981/03/13, 33 y.o.   MRN: [id number]  [facility] PROGRESS NOTE  Imo Cumbie NOM:[id number] DOB: 08-21-1981 DOA: 11/29/2013 PCP: Antony Blackbird, MD  Brief narrative: Patient is 33 year old female with, depression, still U. disease, von Willebrand's disease, Cushing's disease, multiple surgeries including Robaxin 1 procedure, cholecystectomy, abdominal hysterectomy, recently hospitalized for Alinda Sierras virus gastroenteritis and now presented to [facility] emergency department every first 2015 with main concern of progressively worsening epigastric pain radiating to the back with nausea. On admission it was noted that lipase was elevated at 566.   Active Problems:  Nausea and vomiting  - Etiology is relatively unclear, lipase remains unchagned - tolerating diet well with no vomiting  - continue Phenergan for refractory nausea and vomiting  - appreciate GI assistance  - CT abd/pelvis with o acute findings  Gastroenteritis due to norovirus with diarrhea  - This was present on most recent admission, now resolved  - stool checked for O&P, negative  - repeat stool studies as per GI recommendations: stool O&P, culture, giardia/cryptospridium, lactoferrin --> pending this AM  Pancreatitis  - Unclear etiology, management as noted above  - check lipase in AM  Hypertension  - Reasonable inpatient control  Cushing's disease  - Follows with Dr Cruzita Lederer as outpt. On hydrocortisone as outpt and will continue here  Hx of von willebrand ds  - stable. Follows with Dr Humphrey Rolls  Hypokalemia  - supplemented and WNL this AM  Diet: regular diet  DVT prophylaxis: sq lovenox   Code Status: full code  Family Communication: Mother at bedside  Disposition Plan: home once improved   Consultants:  GI Procedures:  US Abdomen Complete 11/30/2013 Negative sonographic appearance of the abdomen; chronic cholecystectomy changes. Ct Abdomen Pelvis W Contrast   12/05/2013  No acute abdominal abnormality.  Status post cholecystectomy and hysterectomy.  Antibiotics:  None   HPI/Subjective: No events overnight.   Objective: Filed Vitals:   12/05/13 1500 12/05/13 2244 12/06/13 0533 12/06/13 1009  BP: 113/75 131/90 119/75 126/81  Pulse: 94 100 95 91  Temp: 98.2 F (36.8 C) 98.1 F (36.7 C) 98.1 F (36.7 C)   TempSrc: Oral Oral Oral   Resp: 16 16 16    Height:      Weight:   100.835 kg (222 lb 4.8 oz)   SpO2: 99% 98% 97%     Intake/Output Summary (Last 24 hours) at 12/06/13 1108 Last data filed at 12/06/13 0900  Gross per 24 hour  Intake 2905.5 ml  Output   3150 ml  Net -244.5 ml    Exam:   General:  Pt is alert, follows commands appropriately, not in acute distress  Cardiovascular: Regular rate and rhythm, S1/S2, no murmurs, no rubs, no gallops  Respiratory: Clear to auscultation bilaterally, no wheezing, no crackles, no rhonchi  Abdomen: Soft, non tender, non distended, bowel sounds present, no guarding  Extremities: No edema, pulses DP and PT palpable bilaterally  Neuro: Grossly nonfocal  Data Reviewed: Basic Metabolic Panel:  Recent Labs Lab 11/30/13 0130 11/30/13 0210 12/03/13 0500 12/04/13 0445 12/05/13 0510 12/06/13 0533  NA  --  141 141 140 139 140  K  --  4.1 3.1* 3.7 3.4* 4.0  CL  --  104 103 102 101 100  CO2  --  24 23 25 26 25   GLUCOSE  --  139* 91 98 108* 99  BUN  --  6 <3* <3* <3* <3*  CREATININE 0.61 0.62 0.62  0.62 0.68 0.64  CALCIUM  --  8.4 7.8* 8.4 8.4 9.0  MG 1.8  --   --   --   --   --    Liver Function Tests:  Recent Labs Lab 11/29/13 1915 11/30/13 0210 12/03/13 0500 12/06/13 0533  AST 26 17 21 17   ALT 31 25 25  32  ALKPHOS 85 80 82 97  BILITOT <0.2* <0.2* <0.2* <0.2*  PROT 7.2 6.5 5.7* 7.0  ALBUMIN 3.5 3.1* 2.7* 3.2*    Recent Labs Lab 12/02/13 0513 12/03/13 0500 12/04/13 0445 12/05/13 0510 12/06/13 0533  LIPASE 127* 99* 85* 93* 95*   CBC:  Recent Labs Lab  11/29/13 1915  12/01/13 0530 12/03/13 0500 12/04/13 0445 12/05/13 0510 12/06/13 0533  WBC 12.2*  < > 8.7 9.3 9.9 10.4 10.5  NEUTROABS 9.0*  --   --   --   --   --   --   HGB 12.9  < > 12.7 11.8* 11.9* 12.2 13.0  HCT 39.2  < > 38.1 36.0 35.8* 37.2 38.9  MCV 101.6*  < > 99.7 99.4 98.4 99.2 99.2  PLT 329  < > 337 299 321 303 307  < > = values in this interval not displayed.  Recent Results (from the past 240 hour(s))  CLOSTRIDIUM DIFFICILE BY PCR     Status: None   Collection Time    12/04/13  3:49 PM      Result Value Range Status   C difficile by pcr NEGATIVE  NEGATIVE Final   Comment: Performed at [facility]     Scheduled Meds: . diltiazem  180 mg Oral q morning - 10a  . enoxaparin (LOVENOX) injection  50 mg Subcutaneous Q24H  . hydrocortisone  10 mg Oral Q breakfast  . hydrocortisone  5 mg Oral Q supper  . Lurasidone HCl  20 mg Oral QPM  . metoprolol  75 mg Oral BID   Continuous Infusions: . sodium chloride 30 mL/hr at 12/05/13 1000   Faye Ramsay, MD  [facility] Pager [phone]  If 7PM-7AM, please contact night-coverage [email or web address] Password [email or web address] 12/06/2013, 11:08 AM   LOS: 7 days

## 2013-12-06 NOTE — Progress Notes (Signed)
Medicated for pain twice during shift.  Both time with good results.  Slept well.  Call bell in reach no needs expressed.  No N/V noted.  Took shower at beginning of shift with her husband helping her.  Tolerated it very well.

## 2013-12-06 NOTE — Progress Notes (Signed)
Family at bedside. Pt states she wants to go home 2/9.

## 2013-12-07 LAB — BASIC METABOLIC PANEL
BUN: 7 mg/dL (ref 6–23)
CHLORIDE: 97 meq/L (ref 96–112)
CO2: 27 mEq/L (ref 19–32)
Calcium: 9.1 mg/dL (ref 8.4–10.5)
Creatinine, Ser: 0.75 mg/dL (ref 0.50–1.10)
Glucose, Bld: 101 mg/dL — ABNORMAL HIGH (ref 70–99)
POTASSIUM: 3.6 meq/L — AB (ref 3.7–5.3)
Sodium: 138 mEq/L (ref 137–147)

## 2013-12-07 LAB — GIARDIA/CRYPTOSPORIDIUM SCREEN(EIA)
Cryptosporidium Screen (EIA): NEGATIVE
Giardia Screen - EIA: NEGATIVE

## 2013-12-07 LAB — CBC
HEMATOCRIT: 37.4 % (ref 36.0–46.0)
Hemoglobin: 12.4 g/dL (ref 12.0–15.0)
MCH: 32.7 pg (ref 26.0–34.0)
MCHC: 33.2 g/dL (ref 30.0–36.0)
MCV: 98.7 fL (ref 78.0–100.0)
Platelets: 324 10*3/uL (ref 150–400)
RBC: 3.79 MIL/uL — ABNORMAL LOW (ref 3.87–5.11)
RDW: 12.3 % (ref 11.5–15.5)
WBC: 9.4 10*3/uL (ref 4.0–10.5)

## 2013-12-07 LAB — OVA AND PARASITE EXAMINATION: Ova and parasites: NONE SEEN

## 2013-12-07 LAB — LIPASE, BLOOD: LIPASE: 102 U/L — AB (ref 11–59)

## 2013-12-07 MED ORDER — PANCRELIPASE (LIP-PROT-AMYL) 12000-38000 UNITS PO CPEP: 2.0000 | ORAL_CAPSULE | Freq: Three times a day (TID) | ORAL | Status: DC

## 2013-12-07 MED ORDER — POTASSIUM CHLORIDE 10 MEQ/100ML IV SOLN
10.0000 meq | INTRAVENOUS | Status: AC
  Administered 2013-12-07 (×3): 10 meq via INTRAVENOUS
  Filled 2013-12-07 (×3): qty 100

## 2013-12-07 MED ORDER — CLONAZEPAM 1 MG PO TABS: 1.0000 mg | ORAL_TABLET | Freq: Two times a day (BID) | ORAL | Status: DC | PRN

## 2013-12-07 MED ORDER — ZOLPIDEM TARTRATE 10 MG PO TABS: 10.0000 mg | ORAL_TABLET | Freq: Every day | ORAL | Status: DC

## 2013-12-07 MED ORDER — ONDANSETRON 4 MG PO TBDP: 4.0000 mg | ORAL_TABLET | Freq: Three times a day (TID) | ORAL | Status: DC | PRN

## 2013-12-07 NOTE — Discharge Instructions (Signed)
Acute Pancreatitis °Acute pancreatitis is a disease in which the pancreas becomes suddenly inflamed. The pancreas is a large gland located behind your stomach. The pancreas produces enzymes that help digest food. The pancreas also releases the hormones glucagon and insulin that help regulate blood sugar. Damage to the pancreas occurs when the digestive enzymes from the pancreas are activated and begin attacking the pancreas before being released into the intestine. Most acute attacks last a couple of days and can cause serious complications. Some people become dehydrated and develop low blood pressure. In severe cases, bleeding into the pancreas can lead to shock and can be life-threatening. The lungs, heart, and kidneys may fail. °CAUSES  °Pancreatitis can happen to anyone. In some cases, the cause is unknown. Most cases are caused by: °· Alcohol abuse. °· Gallstones. °Other less common causes are: °· Certain medicines. °· Exposure to certain chemicals. °· Infection. °· Damage caused by an accident (trauma). °· Abdominal surgery. °SYMPTOMS  °· Pain in the upper abdomen that may radiate to the back. °· Tenderness and swelling of the abdomen. °· Nausea and vomiting. °DIAGNOSIS  °Your caregiver will perform a physical exam. Blood and stool tests may be done to confirm the diagnosis. Imaging tests may also be done, such as X-rays, CT scans, or an ultrasound of the abdomen. °TREATMENT  °Treatment usually requires a stay in the hospital. Treatment may include: °· Pain medicine. °· Fluid replacement through an intravenous line (IV). °· Placing a tube in the stomach to remove stomach contents and control vomiting. °· Not eating for 3 or 4 days. This gives your pancreas a rest, because enzymes are not being produced that can cause further damage. °· Antibiotic medicines if your condition is caused by an infection. °· Surgery of the pancreas or gallbladder. °HOME CARE INSTRUCTIONS  °· Follow the diet advised by your  caregiver. This may involve avoiding alcohol and decreasing the amount of fat in your diet. °· Eat smaller, more frequent meals. This reduces the amount of digestive juices the pancreas produces. °· Drink enough fluids to keep your urine clear or pale yellow. °· Only take over-the-counter or prescription medicines as directed by your caregiver. °· Avoid drinking alcohol if it caused your condition. °· Do not smoke. °· Get plenty of rest. °· Check your blood sugar at home as directed by your caregiver. °· Keep all follow-up appointments as directed by your caregiver. °SEEK MEDICAL CARE IF:  °· You do not recover as quickly as expected. °· You develop new or worsening symptoms. °· You have persistent pain, weakness, or nausea. °· You recover and then have another episode of pain. °SEEK IMMEDIATE MEDICAL CARE IF:  °· You are unable to eat or keep fluids down. °· Your pain becomes severe. °· You have a fever or persistent symptoms for more than 2 to 3 days. °· You have a fever and your symptoms suddenly get worse. °· Your skin or the white part of your eyes turn yellow (jaundice). °· You develop vomiting. °· You feel dizzy, or you faint. °· Your blood sugar is high (over 300 mg/dL). °MAKE SURE YOU:  °· Understand these instructions. °· Will watch your condition. °· Will get help right away if you are not doing well or get worse. °Document Released: 10/15/2005 Document Revised: 04/15/2012 Document Reviewed: 01/24/2012 °[employer]® Patient Information ©2014 [employer]. ° °

## 2013-12-07 NOTE — Discharge Summary (Signed)
Physician Discharge Summary  Glennette Galster TOI:[id number] DOB: 1981-07-16 DOA: 11/29/2013  PCP: Antony Blackbird, MD  Admit date: 11/29/2013 Discharge date: 12/07/2013  Recommendations for Outpatient Follow-up:  1. Pt will need to follow up with PCP in 2-3 weeks post discharge 2. Please obtain BMP to evaluate electrolytes and kidney function 3. Please also check CBC to evaluate Hg and Hct levels 4. Pt advised to see her GI doctor in AM for further evaluation and management   Discharge Diagnoses: Acute pancreatitis  Active Problems:   Nausea and vomiting   Gastroenteritis due to norovirus   Pancreatitis   Abdominal pain, epigastric   Dehydration   Acute pancreatitis  Discharge Condition: Stable  Diet recommendation: Heart healthy diet discussed in details   Brief narrative:  Patient is 33 year old female with, depression, still U. disease, von Willebrand's disease, Cushing's disease, multiple surgeries including Robaxin 1 procedure, cholecystectomy, abdominal hysterectomy, recently hospitalized for Alinda Sierras virus gastroenteritis and now presented to [facility] emergency department every first 2015 with main concern of progressively worsening epigastric pain radiating to the back with nausea. On admission it was noted that lipase was elevated at 566.   Active Problems:  Nausea and vomiting  - Etiology is relatively unclear, lipase remains unchagned  - tolerating diet well with no vomiting  - continue Phenergan for refractory nausea and vomiting as pt responding well  - appreciate GI assistance  - CT abd/pelvis with o acute findings  - follow up with GI in AM in an outpatient setting  Gastroenteritis due to norovirus with diarrhea  - This was present on most recent admission, now resolved  - stool checked for O&P, negative  - repeat stool studies as per GI recommendations: stool O&P, culture, giardia/cryptospridium, lactoferrin --> negative to date  Pancreatitis  - Unclear  etiology, management as noted above  Hypertension  - Reasonable inpatient control  Cushing's disease  - Follows with Dr Cruzita Lederer as outpt. On hydrocortisone as outpt and will continue here  Hx of von willebrand ds  - stable. Follows with Dr Humphrey Rolls  Hypokalemia  - supplemented prior to discharge with KCl 3 runs  Code Status: full code  Family Communication: Mother at bedside   Consultants:  GI Procedures:  US Abdomen Complete 11/30/2013 Negative sonographic appearance of the abdomen; chronic cholecystectomy changes.  Ct Abdomen Pelvis W Contrast 12/05/2013 No acute abdominal abnormality. Status post cholecystectomy and hysterectomy.  Antibiotics:  None   Discharge Exam: Filed Vitals:   12/07/13 0530  BP: 109/64  Pulse: 90  Temp: 97.8 F (36.6 C)  Resp: 16   Filed Vitals:   12/06/13 1009 12/06/13 1459 12/06/13 2215 12/07/13 0530  BP: 126/81 127/81 112/78 109/64  Pulse: 91 96 104 90  Temp:  98.2 F (36.8 C) 97.3 F (36.3 C) 97.8 F (36.6 C)  TempSrc:  Oral Oral Oral  Resp:  16 16 16   Height:      Weight:    99.882 kg (220 lb 3.2 oz)  SpO2:  94% 96% 97%    General: Pt is alert, follows commands appropriately, not in acute distress Cardiovascular: Regular rate and rhythm, S1/S2 +, no murmurs, no rubs, no gallops Respiratory: Clear to auscultation bilaterally, no wheezing, no crackles, no rhonchi Abdominal: Soft, non tender, non distended, bowel sounds +, no guarding Extremities: no edema, no cyanosis, pulses palpable bilaterally DP and PT Neuro: Grossly nonfocal  Discharge Instructions  Discharge Orders   Future Orders Complete By Expires   Diet - low sodium  heart healthy  As directed    Increase activity slowly  As directed        Medication List         clonazePAM 1 MG tablet  Commonly known as:  KLONOPIN  Take 1 tablet (1 mg total) by mouth 2 (two) times daily as needed for anxiety.     diltiazem 180 MG 24 hr capsule  Commonly known as:  DILACOR XR  Take  180 mg by mouth every morning.     DULoxetine 60 MG capsule  Commonly known as:  CYMBALTA  Take 60 mg by mouth daily.     EPIPEN 0.3 mg/0.3 mL Soaj injection  Generic drug:  EPINEPHrine  Inject 0.3 mg into the muscle once as needed (severe allergic reaction).     hydrocortisone 5 MG tablet  Commonly known as:  CORTEF  Take 5-10 mg by mouth 2 (two) times daily. Take 69m in the morning and 575min the afternoon     hydrocortisone sodium succinate 100 mg/2 mL injection  Commonly known as:  SOLU-CORTEF  Inject 100 mg in the muscle over 30 seconds if you cannot take hydrocortisone by mouth     LATUDA 20 MG Tabs  Generic drug:  Lurasidone HCl  Take 20 mg by mouth every evening.     lipase/protease/amylase 12000 UNITS Cpep capsule  Commonly known as:  CREON-12/PANCREASE  Take 2 capsules by mouth 3 (three) times daily before meals.     metoprolol 50 MG tablet  Commonly known as:  LOPRESSOR  Take 75 mg by mouth 2 (two) times daily.     montelukast 10 MG tablet  Commonly known as:  SINGULAIR  Take 10 mg by mouth at bedtime.     ondansetron 4 MG disintegrating tablet  Commonly known as:  ZOFRAN-ODT  Take 1-2 tablets (4-8 mg total) by mouth every 8 (eight) hours as needed for nausea or vomiting.     zolpidem 10 MG tablet  Commonly known as:  AMBIEN  Take 1 tablet (10 mg total) by mouth at bedtime.           Follow-up Information   Follow up with MEDOFF,JEFFREY R, MD.   Specialty:  Gastroenterology   Contact information:   [phone]      The results of significant diagnostics from this hospitalization (including imaging, microbiology, ancillary and laboratory) are listed below for reference.     Microbiology: Recent Results (from the past 240 hour(s))  CLOSTRIDIUM DIFFICILE BY PCR     Status: None   Collection Time    12/04/13  3:49 PM      Result Value Range Status   C difficile by pcr NEGATIVE  NEGATIVE Final    Comment: Performed at [facility]   Status: None   Collection Time    12/04/13  3:49 PM      Result Value Range Status   Specimen Description STOOL   Final   Special Requests NONE   Final   Culture     Final   Value: NO SUSPICIOUS COLONIES, CONTINUING TO HOLD     Performed at [facility] Report Status PENDING   Incomplete     Labs: Basic Metabolic Panel:  Recent Labs Lab 12/03/13 0500 12/04/13 0445 12/05/13 0510 12/06/13 0533 12/07/13 0426  NA 141 140 139 140 138  K 3.1* 3.7 3.4* 4.0 3.6*  CL 103  102 101 100 97  CO2 23 25 26 25 27   GLUCOSE 91 98 108* 99 101*  BUN <3* <3* <3* <3* 7  CREATININE 0.62 0.62 0.68 0.64 0.75  CALCIUM 7.8* 8.4 8.4 9.0 9.1   Liver Function Tests:  Recent Labs Lab 12/03/13 0500 12/06/13 0533  AST 21 17  ALT 25 32  ALKPHOS 82 97  BILITOT <0.2* <0.2*  PROT 5.7* 7.0  ALBUMIN 2.7* 3.2*    Recent Labs Lab 12/03/13 0500 12/04/13 0445 12/05/13 0510 12/06/13 0533 12/07/13 0426  LIPASE 99* 85* 93* 95* 102*   CBC:  Recent Labs Lab 12/03/13 0500 12/04/13 0445 12/05/13 0510 12/06/13 0533 12/07/13 0426  WBC 9.3 9.9 10.4 10.5 9.4  HGB 11.8* 11.9* 12.2 13.0 12.4  HCT 36.0 35.8* 37.2 38.9 37.4  MCV 99.4 98.4 99.2 99.2 98.7  PLT 299 321 303 307 324   BNP: BNP (last 3 results)  Recent Labs  11/23/13 1704 11/23/13 2040 11/24/13 0345  PROBNP 356.0* 408.5* 310.5*     SIGNED: Time coordinating discharge: Over 30 minutes  Faye Ramsay, MD  [facility] 12/07/2013, 11:17 AM Pager [phone]  If 7PM-7AM, please contact night-coverage [email or web address] Password [email or web address]

## 2013-12-07 NOTE — Progress Notes (Signed)
Pt receiving 3rd run of potassium IV.  Ready for discharge as soon as this completes. Paperwork reviewed, prescriptions provided. Pt denies further questions or concerns at this time.  Ardyth GalAnderson, Aurora Rody Ann, RN 12/07/2013

## 2013-12-08 ENCOUNTER — Encounter: Payer: Self-pay | Admitting: Internal Medicine

## 2013-12-08 LAB — STOOL CULTURE

## 2013-12-28 ENCOUNTER — Emergency Department (HOSPITAL_BASED_OUTPATIENT_CLINIC_OR_DEPARTMENT_OTHER)
Admission: EM | Admit: 2013-12-28 | Discharge: 2013-12-28 | Disposition: A | Discharge status: Home / Self Care | Payer: BC Managed Care – PPO | Attending: Emergency Medicine | Admitting: Emergency Medicine

## 2013-12-28 ENCOUNTER — Encounter (HOSPITAL_BASED_OUTPATIENT_CLINIC_OR_DEPARTMENT_OTHER): Payer: Self-pay | Admitting: Emergency Medicine

## 2013-12-28 DIAGNOSIS — J45909 Unspecified asthma, uncomplicated: Secondary | ICD-10-CM | POA: Insufficient documentation

## 2013-12-28 DIAGNOSIS — R197 Diarrhea, unspecified: Secondary | ICD-10-CM | POA: Insufficient documentation

## 2013-12-28 DIAGNOSIS — R638 Other symptoms and signs concerning food and fluid intake: Secondary | ICD-10-CM | POA: Insufficient documentation

## 2013-12-28 DIAGNOSIS — F411 Generalized anxiety disorder: Secondary | ICD-10-CM | POA: Insufficient documentation

## 2013-12-28 DIAGNOSIS — Z9089 Acquired absence of other organs: Secondary | ICD-10-CM | POA: Insufficient documentation

## 2013-12-28 DIAGNOSIS — R1013 Epigastric pain: Secondary | ICD-10-CM | POA: Insufficient documentation

## 2013-12-28 DIAGNOSIS — F329 Major depressive disorder, single episode, unspecified: Secondary | ICD-10-CM | POA: Insufficient documentation

## 2013-12-28 DIAGNOSIS — F3289 Other specified depressive episodes: Secondary | ICD-10-CM | POA: Insufficient documentation

## 2013-12-28 DIAGNOSIS — R112 Nausea with vomiting, unspecified: Secondary | ICD-10-CM | POA: Insufficient documentation

## 2013-12-28 DIAGNOSIS — IMO0002 Reserved for concepts with insufficient information to code with codable children: Secondary | ICD-10-CM | POA: Insufficient documentation

## 2013-12-28 DIAGNOSIS — Z862 Personal history of diseases of the blood and blood-forming organs and certain disorders involving the immune mechanism: Secondary | ICD-10-CM | POA: Insufficient documentation

## 2013-12-28 DIAGNOSIS — R109 Unspecified abdominal pain: Secondary | ICD-10-CM

## 2013-12-28 DIAGNOSIS — Z79899 Other long term (current) drug therapy: Secondary | ICD-10-CM | POA: Insufficient documentation

## 2013-12-28 DIAGNOSIS — R1012 Left upper quadrant pain: Secondary | ICD-10-CM | POA: Insufficient documentation

## 2013-12-28 DIAGNOSIS — Z8679 Personal history of other diseases of the circulatory system: Secondary | ICD-10-CM | POA: Insufficient documentation

## 2013-12-28 DIAGNOSIS — Z8719 Personal history of other diseases of the digestive system: Secondary | ICD-10-CM | POA: Insufficient documentation

## 2013-12-28 DIAGNOSIS — Z90710 Acquired absence of both cervix and uterus: Secondary | ICD-10-CM | POA: Insufficient documentation

## 2013-12-28 DIAGNOSIS — G8929 Other chronic pain: Secondary | ICD-10-CM | POA: Insufficient documentation

## 2013-12-28 LAB — CBC WITH DIFFERENTIAL/PLATELET
BASOS ABS: 0 10*3/uL (ref 0.0–0.1)
Band Neutrophils: 1 % (ref 0–10)
Basophils Relative: 0 % (ref 0–1)
EOS ABS: 0.1 10*3/uL (ref 0.0–0.7)
EOS PCT: 1 % (ref 0–5)
HEMATOCRIT: 42 % (ref 36.0–46.0)
Hemoglobin: 13.9 g/dL (ref 12.0–15.0)
LYMPHS PCT: 17 % (ref 12–46)
Lymphs Abs: 1.7 10*3/uL (ref 0.7–4.0)
MCH: 32.4 pg (ref 26.0–34.0)
MCHC: 33.1 g/dL (ref 30.0–36.0)
MCV: 97.9 fL (ref 78.0–100.0)
MONOS PCT: 8 % (ref 3–12)
Monocytes Absolute: 0.8 10*3/uL (ref 0.1–1.0)
NEUTROS ABS: 7.4 10*3/uL (ref 1.7–7.7)
Neutrophils Relative %: 73 % (ref 43–77)
PLATELETS: 282 10*3/uL (ref 150–400)
RBC: 4.29 MIL/uL (ref 3.87–5.11)
RDW: 12.5 % (ref 11.5–15.5)
WBC: 10 10*3/uL (ref 4.0–10.5)

## 2013-12-28 LAB — COMPREHENSIVE METABOLIC PANEL
ALBUMIN: 4.1 g/dL (ref 3.5–5.2)
ALK PHOS: 91 U/L (ref 39–117)
ALT: 30 U/L (ref 0–35)
AST: 23 U/L (ref 0–37)
BUN: 10 mg/dL (ref 6–23)
CHLORIDE: 100 meq/L (ref 96–112)
CO2: 26 meq/L (ref 19–32)
CREATININE: 1 mg/dL (ref 0.50–1.10)
Calcium: 9.8 mg/dL (ref 8.4–10.5)
GFR calc Af Amer: 87 mL/min — ABNORMAL LOW (ref >90)
GFR, EST NON AFRICAN AMERICAN: 75 mL/min — AB (ref >90)
Glucose, Bld: 96 mg/dL (ref 70–99)
POTASSIUM: 4.1 meq/L (ref 3.7–5.3)
SODIUM: 141 meq/L (ref 137–147)
Total Bilirubin: 0.3 mg/dL (ref 0.3–1.2)
Total Protein: 8 g/dL (ref 6.0–8.3)

## 2013-12-28 LAB — URINALYSIS, ROUTINE W REFLEX MICROSCOPIC
BILIRUBIN URINE: NEGATIVE
Glucose, UA: NEGATIVE mg/dL
HGB URINE DIPSTICK: NEGATIVE
KETONES UR: NEGATIVE mg/dL
LEUKOCYTES UA: NEGATIVE
Nitrite: NEGATIVE
PH: 5.5 (ref 5.0–8.0)
PROTEIN: NEGATIVE mg/dL
Specific Gravity, Urine: 1.012 (ref 1.005–1.030)
Urobilinogen, UA: 0.2 mg/dL (ref 0.0–1.0)

## 2013-12-28 LAB — LIPASE, BLOOD: Lipase: 35 U/L (ref 11–59)

## 2013-12-28 MED ORDER — HYDROCORTISONE NA SUCCINATE PF 100 MG IJ SOLR
100.0000 mg | Freq: Once | INTRAMUSCULAR | Status: AC
Start: 2013-12-28 — End: 2013-12-28
  Administered 2013-12-28: 100 mg via INTRAVENOUS
  Filled 2013-12-28: qty 2

## 2013-12-28 MED ORDER — PROMETHAZINE HCL 25 MG/ML IJ SOLN
25.0000 mg | INTRAMUSCULAR | Status: AC
  Administered 2013-12-28: 25 mg via INTRAVENOUS
  Filled 2013-12-28: qty 1

## 2013-12-28 MED ORDER — HYDROMORPHONE HCL PF 1 MG/ML IJ SOLN
1.0000 mg | Freq: Once | INTRAMUSCULAR | Status: AC
Start: 2013-12-28 — End: 2013-12-28
  Administered 2013-12-28: 1 mg via INTRAVENOUS
  Filled 2013-12-28: qty 1

## 2013-12-28 MED ORDER — SODIUM CHLORIDE 0.9 % IV BOLUS (SEPSIS)
1000.0000 mL | Freq: Once | INTRAVENOUS | Status: AC
  Administered 2013-12-28: 1000 mL via INTRAVENOUS

## 2013-12-28 MED ORDER — OXYCODONE-ACETAMINOPHEN 5-325 MG PO TABS: 1.0000 | ORAL_TABLET | ORAL | Status: DC | PRN

## 2013-12-28 NOTE — ED Notes (Signed)
Pt given 2 cans of ginger ale per request.

## 2013-12-28 NOTE — Discharge Instructions (Signed)
Follow a clear liquid diet and drink plenty of fluids  Use Zofran and phenergan as needed for nausea and vomiting  Return to the emergency department if you develop any changing/worsening condition, repeated vomiting, fever, passing out, blood in your stool/vomit, or any other concerns (please read additional information regarding your condition below)   Abdominal Pain, Adult Many things can cause abdominal pain. Usually, abdominal pain is not caused by a disease and will improve without treatment. It can often be observed and treated at home. Your health care provider will do a physical exam and possibly order blood tests and X-rays to help determine the seriousness of your pain. However, in many cases, more time must pass before a clear cause of the pain can be found. Before that point, your health care provider may not know if you need more testing or further treatment. HOME CARE INSTRUCTIONS  Monitor your abdominal pain for any changes. The following actions may help to alleviate any discomfort you are experiencing:  Only take over-the-counter or prescription medicines as directed by your health care provider.  Do not take laxatives unless directed to do so by your health care provider.  Try a clear liquid diet (broth, tea, or water) as directed by your health care provider. Slowly move to a bland diet as tolerated. SEEK MEDICAL CARE IF:  You have unexplained abdominal pain.  You have abdominal pain associated with nausea or diarrhea.  You have pain when you urinate or have a bowel movement.  You experience abdominal pain that wakes you in the night.  You have abdominal pain that is worsened or improved by eating food.  You have abdominal pain that is worsened with eating fatty foods. SEEK IMMEDIATE MEDICAL CARE IF:   Your pain does not go away within 2 hours.  You have a fever.  You keep throwing up (vomiting).  Your pain is felt only in portions of the abdomen, such as the  right side or the left lower portion of the abdomen.  You pass bloody or black tarry stools. MAKE SURE YOU:  Understand these instructions.   Will watch your condition.   Will get help right away if you are not doing well or get worse.  Document Released: 07/25/2005 Document Revised: 08/05/2013 Document Reviewed: 06/24/2013 [facility] Patient Information 2014 [place], [place].  Nausea and Vomiting Nausea is a sick feeling that often comes before throwing up (vomiting). Vomiting is a reflex where stomach contents come out of your mouth. Vomiting can cause severe loss of body fluids (dehydration). Children and elderly adults can become dehydrated quickly, especially if they also have diarrhea. Nausea and vomiting are symptoms of a condition or disease. It is important to find the cause of your symptoms. CAUSES   Direct irritation of the stomach lining. This irritation can result from increased acid production (gastroesophageal reflux disease), infection, food poisoning, taking certain medicines (such as nonsteroidal anti-inflammatory drugs), alcohol use, or tobacco use.  Signals from the brain.These signals could be caused by a headache, heat exposure, an inner ear disturbance, increased pressure in the brain from injury, infection, a tumor, or a concussion, pain, emotional stimulus, or metabolic problems.  An obstruction in the gastrointestinal tract (bowel obstruction).  Illnesses such as diabetes, hepatitis, gallbladder problems, appendicitis, kidney problems, cancer, sepsis, atypical symptoms of a heart attack, or eating disorders.  Medical treatments such as chemotherapy and radiation.  Receiving medicine that makes you sleep (general anesthetic) during surgery. DIAGNOSIS Your caregiver may ask for tests to  be done if the problems do not improve after a few days. Tests may also be done if symptoms are severe or if the reason for the nausea and vomiting is not clear. Tests may  include:  Urine tests.  Blood tests.  Stool tests.  Cultures (to look for evidence of infection).  X-rays or other imaging studies. Test results can help your caregiver make decisions about treatment or the need for additional tests. TREATMENT You need to stay well hydrated. Drink frequently but in small amounts.You may wish to drink water, sports drinks, clear broth, or eat frozen ice pops or gelatin dessert to help stay hydrated.When you eat, eating slowly may help prevent nausea.There are also some antinausea medicines that may help prevent nausea. HOME CARE INSTRUCTIONS   Take all medicine as directed by your caregiver.  If you do not have an appetite, do not force yourself to eat. However, you must continue to drink fluids.  If you have an appetite, eat a normal diet unless your caregiver tells you differently.  Eat a variety of complex carbohydrates (rice, wheat, potatoes, bread), lean meats, yogurt, fruits, and vegetables.  Avoid high-fat foods because they are more difficult to digest.  Drink enough water and fluids to keep your urine clear or pale yellow.  If you are dehydrated, ask your caregiver for specific rehydration instructions. Signs of dehydration may include:  Severe thirst.  Dry lips and mouth.  Dizziness.  Dark urine.  Decreasing urine frequency and amount.  Confusion.  Rapid breathing or pulse. SEEK IMMEDIATE MEDICAL CARE IF:   You have blood or brown flecks (like coffee grounds) in your vomit.  You have black or bloody stools.  You have a severe headache or stiff neck.  You are confused.  You have severe abdominal pain.  You have chest pain or trouble breathing.  You do not urinate at least once every 8 hours.  You develop cold or clammy skin.  You continue to vomit for longer than 24 to 48 hours.  You have a fever. MAKE SURE YOU:   Understand these instructions.  Will watch your condition.  Will get help right away if  you are not doing well or get worse. Document Released: 10/15/2005 Document Revised: 01/07/2012 Document Reviewed: 03/14/2011 [facility] Patient Information 2014 [place], [place].  Diet The clear liquid diet consists of foods that are liquid or will become liquid at room temperature. Examples of foods allowed on a clear liquid diet include fruit juice, broth or bouillon, gelatin, or frozen ice pops. You should be able to see through the liquid. The purpose of this diet is to provide the necessary fluids, electrolytes (such as sodium and potassium), and energy to keep the body functioning during times when you are not able to consume a regular diet. A clear liquid diet should not be continued for long periods of time, as it is not nutritionally adequate.  A CLEAR LIQUID DIET MAY BE NEEDED:  When a sudden-onset (acute) condition occurs before or after surgery.   As the first step in oral feeding.   For fluid and electrolyte replacement in diarrheal diseases.   As a diet before certain medical tests are performed.  ADEQUACY The clear liquid diet is adequate only in ascorbic acid, according to the Recommended Dietary Allowances of the [employer].  CHOOSING FOODS Breads and Starches  Allowed: None are allowed.   Avoid: All are to be avoided.  Vegetables  Allowed: Strained vegetable juices.   Avoid: Any  others.  Fruit  Allowed: Strained fruit juices and fruit drinks. Include 1 serving of citrus or vitamin C-enriched fruit juice daily.   Avoid: Any others.  Meat and Meat Substitutes  Allowed: None are allowed.   Avoid: All are to be avoided.  Milk Products  Allowed: None are allowed.   Avoid: All are to be avoided.  Soups and Combination Foods  Allowed: Clear bouillon, broth, or strained broth-based soups.   Avoid: Any others.  Desserts and Sweets  Allowed: Sugar, honey. High-protein gelatin. Flavored gelatin, ices, or  frozen ice pops that do not contain milk.   Avoid: Any others.  Fats and Oils  Allowed: None are allowed.   Avoid: All are to be avoided.  Beverages  Allowed: Cereal beverages, coffee (regular or decaffeinated), tea, or soda at the discretion of your health care provider.   Avoid: Any others.  Condiments  Allowed: Salt.   Avoid: Any others, including pepper.  Supplements  Allowed: Liquid nutrition beverages that you can see through.   Avoid: Any others that contain lactose or fiber. SAMPLE MEAL PLAN Breakfast  4 oz (120 mL) strained orange juice.   to 1 cup (120 to 240 mL) gelatin (plain or fortified).  1 cup (240 mL) beverage (coffee or tea).  Sugar, if desired. Midmorning Snack   cup (120 mL) gelatin (plain or fortified). Lunch  1 cup (240 mL) broth or consomm.  4 oz (120 mL) strained grapefruit juice.   cup (120 mL) gelatin (plain or fortified).  1 cup (240 mL) beverage (coffee or tea).  Sugar, if desired. Midafternoon Snack   cup (120 mL) fruit ice.   cup (120 mL) strained fruit juice. Dinner  1 cup (240 mL) broth or consomm.   cup (120 mL) cranberry juice.   cup (120 mL) flavored gelatin (plain or fortified).  1 cup (240 mL) beverage (coffee or tea).  Sugar, if desired. Evening Snack  4 oz (120 mL) strained apple juice (vitamin C-fortified).   cup (120 mL) flavored gelatin (plain or fortified). MAKE SURE YOU:  Understand these instructions.  Will watch your child's condition.  Will get help right away if your child is not doing well or gets worse. Document Released: 10/15/2005 Document Revised: 06/17/2013 Document Reviewed: 03/17/2013 [facility] Patient Information 2014 [place], [place].

## 2013-12-28 NOTE — ED Notes (Signed)
abd pain with vomiting x 2 months  Dx pancreatitis  Has appointment at [facility] in 2 days

## 2013-12-28 NOTE — ED Provider Notes (Signed)
CSN: [id number]     Arrival date & time 12/28/13  1535 History   None    Chief Complaint  Patient presents with  . Abdominal Pain    HPI  Kaitlyn Johnson is a 33 y.o. female with a PMH of SMA syndrome, celiac disease, iatrogenic cushing's, POTS, asthma, anxiety, depression, von willebrand disease, anemia, and hiatal hernia who presents to the ED for evaluation of abdominal pain. History was provided by the patient. Patient states that she has chronic pancreatitis over the past 2 months. She has an appointment in two days at [facility] to see a specialist for this (Dr. Conception Chancy). She denies any acute changes in her condition. She has had nausea and multiple episodes of emesis over the past 24 hours. She states she gets this way during "flare-ups." She also has diarrhea, but this is chronic. No hematemesis or hematochezia. Her abdominal pain is located in her LUQ without radiation. She describes a constant sharp pain. Nothing makes her pain better or worse. She tried taking Zofran for her nausea with no relief. She called her GI specialist Dr. Kinnie Scales and he recommended she come to the ED for IV fluids. No fevers, chills, change in appetite/activity, cough, chest pain, SOB, headache, dizziness, dysuria, vaginal discharge/bleeding, or lightheadedness. Previous abdominal surgeries include a hysterectomy, roux-en-y, and cholecystectomy.    Past Medical History  Diagnosis Date  . Von Willebrand disease     bruising easy  . Anemia 11/26/2011  . POTS (postural orthostatic tachycardia syndrome) 11/26/2011  . POTS (postural orthostatic tachycardia syndrome)   . Asthma   . Anxiety   . Depression   . H/O hiatal hernia   . Celiac and mesenteric artery injury   . Celiac and mesenteric artery injury    Past Surgical History  Procedure Laterality Date  . Abdominal hysterectomy    . Laparoscopic endometriosis fulguration    . Roux-en-y procedure    . Cholecystectomy    . Anterior cervical decomp/discectomy  fusion N/A 03/31/2013    Procedure: ANTERIOR CERVICAL DECOMPRESSION/DISCECTOMY FUSION 1 LEVEL Cervical five-six;  Surgeon: Reinaldo Meeker, MD;  Location: [facility];  Service: Neurosurgery;  Laterality: N/A;   Family History  Problem Relation Age of Onset  . Hypertension Mother    History  Substance Use Topics  . Smoking status: Never Smoker   . Smokeless tobacco: Never Used  . Alcohol Use: 3.0 oz/week    5 Glasses of wine per week   OB History   Grav Para Term Preterm Abortions TAB SAB Ect Mult Living                 Review of Systems  Constitutional: Positive for appetite change. Negative for fever, chills, diaphoresis, activity change and fatigue.  HENT: Negative for congestion and rhinorrhea.   Respiratory: Negative for cough and shortness of breath.   Cardiovascular: Negative for chest pain and leg swelling.  Gastrointestinal: Positive for nausea, vomiting, abdominal pain and diarrhea. Negative for constipation, blood in stool and anal bleeding.  Genitourinary: Negative for dysuria, decreased urine volume, vaginal bleeding, vaginal discharge, difficulty urinating and vaginal pain.  Neurological: Negative for dizziness, syncope, weakness, light-headedness, numbness and headaches.    Allergies  Shellfish allergy; Reglan; Wheat bran; Sulfa antibiotics; and Tartrazine  Home Medications   Current Outpatient Rx  Name  Route  Sig  Dispense  Refill  . clonazePAM (KLONOPIN) 1 MG tablet   Oral   Take 1 tablet (1 mg total) by mouth 2 (  two) times daily as needed for anxiety.   30 tablet   0   . diltiazem (DILACOR XR) 180 MG 24 hr capsule   Oral   Take 180 mg by mouth every morning.          . DULoxetine (CYMBALTA) 60 MG capsule   Oral   Take 60 mg by mouth daily.         Marland Kitchen EPINEPHrine (EPIPEN) 0.3 mg/0.3 mL SOAJ injection   Intramuscular   Inject 0.3 mg into the muscle once as needed (severe allergic reaction).         . hydrocortisone (CORTEF) 5 MG tablet    Oral   Take 5-10 mg by mouth 2 (two) times daily. Take 10mg  in the morning and 5mg  in the afternoon         . hydrocortisone sodium succinate (SOLU-CORTEF) 100 mg/2 mL injection      Inject 100 mg in the muscle over 30 seconds if you cannot take hydrocortisone by mouth   5 each   prn   . lipase/protease/amylase (CREON-12/PANCREASE) 12000 UNITS CPEP capsule   Oral   Take 2 capsules by mouth 3 (three) times daily before meals.   270 capsule   1   . Lurasidone HCl (LATUDA) 20 MG TABS   Oral   Take 20 mg by mouth every evening.          . metoprolol (LOPRESSOR) 50 MG tablet   Oral   Take 75 mg by mouth 2 (two) times daily.         . montelukast (SINGULAIR) 10 MG tablet   Oral   Take 10 mg by mouth at bedtime.         . ondansetron (ZOFRAN-ODT) 4 MG disintegrating tablet   Oral   Take 1-2 tablets (4-8 mg total) by mouth every 8 (eight) hours as needed for nausea or vomiting.   20 tablet   0   . zolpidem (AMBIEN) 10 MG tablet   Oral   Take 1 tablet (10 mg total) by mouth at bedtime.   30 tablet   0    BP 123/78  Pulse 82  Temp(Src) 98.4 F (36.9 C)  Resp 20  Ht 5\' 9"  (1.753 m)  Wt 220 lb (99.791 kg)  BMI 32.47 kg/m2  SpO2 100%  Filed Vitals:   12/28/13 1544 12/28/13 1836  BP: 123/78 128/72  Pulse: 82 86  Temp: 98.4 F (36.9 C) 98.2 F (36.8 C)  TempSrc:  Oral  Resp: 20 18  Height: 5\' 9"  (1.753 m)   Weight: 220 lb (99.791 kg)   SpO2: 100% 100%    Physical Exam  Nursing note and vitals reviewed. Constitutional: She is oriented to person, place, and time. She appears well-developed and well-nourished. No distress.  HENT:  Head: Normocephalic and atraumatic.  Right Ear: External ear normal.  Left Ear: External ear normal.  Nose: Nose normal.  Mouth/Throat: Oropharynx is clear and moist. No oropharyngeal exudate.  Eyes: Conjunctivae are normal. Pupils are equal, round, and reactive to light. Right eye exhibits no discharge. Left eye exhibits no  discharge.  Neck: Normal range of motion. Neck supple.  Cardiovascular: Normal rate, regular rhythm and normal heart sounds.  Exam reveals no gallop and no friction rub.   No murmur heard. Pulmonary/Chest: Effort normal and breath sounds normal. No respiratory distress. She has no wheezes. She has no rales. She exhibits no tenderness.  Abdominal: Soft. Bowel sounds are normal. She exhibits no  distension and no mass. There is tenderness. There is no rebound and no guarding.  Mild LUQ and epigastric tenderness to palpation  Musculoskeletal: Normal range of motion. She exhibits no edema and no tenderness.  No LE edema or calf tenderness bilaterally  Neurological: She is alert and oriented to person, place, and time.  Skin: Skin is warm and dry. She is not diaphoretic.    ED Course  Procedures (including critical care time) Labs Review Labs Reviewed - No data to display Imaging Review No results found.   EKG Interpretation None      Results for orders placed during the hospital encounter of 12/28/13  CBC WITH DIFFERENTIAL      Result Value Ref Range   WBC 10.0  4.0 - 10.5 K/uL   RBC 4.29  3.87 - 5.11 MIL/uL   Hemoglobin 13.9  12.0 - 15.0 g/dL   HCT 86.542.0  78.436.0 - 69.646.0 %   MCV 97.9  78.0 - 100.0 fL   MCH 32.4  26.0 - 34.0 pg   MCHC 33.1  30.0 - 36.0 g/dL   RDW 29.512.5  28.411.5 - 13.215.5 %   Platelets 282  150 - 400 K/uL   Neutrophils Relative % 73  43 - 77 %   Lymphocytes Relative 17  12 - 46 %   Monocytes Relative 8  3 - 12 %   Eosinophils Relative 1  0 - 5 %   Basophils Relative 0  0 - 1 %   Band Neutrophils 1  0 - 10 %   Neutro Abs 7.4  1.7 - 7.7 K/uL   Lymphs Abs 1.7  0.7 - 4.0 K/uL   Monocytes Absolute 0.8  0.1 - 1.0 K/uL   Eosinophils Absolute 0.1  0.0 - 0.7 K/uL   Basophils Absolute 0.0  0.0 - 0.1 K/uL  COMPREHENSIVE METABOLIC PANEL      Result Value Ref Range   Sodium 141  137 - 147 mEq/L   Potassium 4.1  3.7 - 5.3 mEq/L   Chloride 100  96 - 112 mEq/L   CO2 26  19 - 32  mEq/L   Glucose, Bld 96  70 - 99 mg/dL   BUN 10  6 - 23 mg/dL   Creatinine, Ser 4.401.00  0.50 - 1.10 mg/dL   Calcium 9.8  8.4 - 10.210.5 mg/dL   Total Protein 8.0  6.0 - 8.3 g/dL   Albumin 4.1  3.5 - 5.2 g/dL   AST 23  0 - 37 U/L   ALT 30  0 - 35 U/L   Alkaline Phosphatase 91  39 - 117 U/L   Total Bilirubin 0.3  0.3 - 1.2 mg/dL   GFR calc non Af Amer 75 (*) >90 mL/min   GFR calc Af Amer 87 (*) >90 mL/min  LIPASE, BLOOD      Result Value Ref Range   Lipase 35  11 - 59 U/L  URINALYSIS, ROUTINE W REFLEX MICROSCOPIC      Result Value Ref Range   Color, Urine YELLOW  YELLOW   APPearance CLOUDY (*) CLEAR   Specific Gravity, Urine 1.012  1.005 - 1.030   pH 5.5  5.0 - 8.0   Glucose, UA NEGATIVE  NEGATIVE mg/dL   Hgb urine dipstick NEGATIVE  NEGATIVE   Bilirubin Urine NEGATIVE  NEGATIVE   Ketones, ur NEGATIVE  NEGATIVE mg/dL   Protein, ur NEGATIVE  NEGATIVE mg/dL   Urobilinogen, UA 0.2  0.0 - 1.0 mg/dL  Nitrite NEGATIVE  NEGATIVE   Leukocytes, UA NEGATIVE  NEGATIVE     MDM   Beonka Amesquita is a 33 y.o. female with a PMH of SMA syndrome, celiac disease, iatrogenic cushing's, POTS, asthma, anxiety, depression, von willebrand disease, anemia, and hiatal hernia who presents to the ED for evaluation of abdominal pain.   Rechecks 6:15 PM = Pain improving. Patient able to tolerate PO liquids. Patient able to ambulate without difficulty or ataxia. Patient ready for discharge.    Patient evaluated for chronic unchanged abdominal pain with episodic nausea and vomiting. Patient in ED for IV hydration. Patient's symptoms thought to be related to pancreatitis with no clear etiology. Patient currently being managed by GI specialists and has a follow-up appointment at [facility] in two days. Given stress dose of steroids in the ED. Labs unremarkable. Lipase improved from baseline (35). Abdominal exam benign.  Able to tolerate PO liquids. Encouraged to continue to drink fluids. Return precautions,  discharge instructions, and follow-up was discussed with the patient before discharge.      Discharge Medication List as of 12/28/2013  6:23 PM    START taking these medications   Details  oxyCODONE-acetaminophen (PERCOCET/ROXICET) 5-325 MG per tablet Take 1-2 tablets by mouth every 4 (four) hours as needed for severe pain., Starting 12/28/2013, Until Discontinued, Print        Final impressions: 1. Nausea with vomiting   2. Chronic abdominal pain      Luiz Iron PA-C   This patient was discussed with Dr. Susy Frizzle, PA-C 12/30/13 [id number]

## 2013-12-30 NOTE — ED Provider Notes (Signed)
Medical screening examination/treatment/procedure(s) were performed by non-physician practitioner and as supervising physician I was immediately available for consultation/collaboration.   EKG Interpretation None        Rolan BuccoMelanie Meliah Appleman, MD 12/30/13 1006

## 2014-01-04 NOTE — Progress Notes (Signed)
PHI accessed to follow up on study feedback from Radiologist

## 2014-01-24 ENCOUNTER — Emergency Department (HOSPITAL_BASED_OUTPATIENT_CLINIC_OR_DEPARTMENT_OTHER)
Admission: EM | Admit: 2014-01-24 | Discharge: 2014-01-25 | Disposition: A | Discharge status: Home / Self Care | Payer: BC Managed Care – PPO | Attending: Emergency Medicine | Admitting: Emergency Medicine

## 2014-01-24 ENCOUNTER — Encounter (HOSPITAL_BASED_OUTPATIENT_CLINIC_OR_DEPARTMENT_OTHER): Payer: Self-pay | Admitting: Emergency Medicine

## 2014-01-24 DIAGNOSIS — J45909 Unspecified asthma, uncomplicated: Secondary | ICD-10-CM | POA: Insufficient documentation

## 2014-01-24 DIAGNOSIS — F411 Generalized anxiety disorder: Secondary | ICD-10-CM | POA: Insufficient documentation

## 2014-01-24 DIAGNOSIS — Z862 Personal history of diseases of the blood and blood-forming organs and certain disorders involving the immune mechanism: Secondary | ICD-10-CM | POA: Insufficient documentation

## 2014-01-24 DIAGNOSIS — Z9089 Acquired absence of other organs: Secondary | ICD-10-CM | POA: Insufficient documentation

## 2014-01-24 DIAGNOSIS — Z87828 Personal history of other (healed) physical injury and trauma: Secondary | ICD-10-CM | POA: Insufficient documentation

## 2014-01-24 DIAGNOSIS — Z79899 Other long term (current) drug therapy: Secondary | ICD-10-CM | POA: Insufficient documentation

## 2014-01-24 DIAGNOSIS — Z90710 Acquired absence of both cervix and uterus: Secondary | ICD-10-CM | POA: Insufficient documentation

## 2014-01-24 DIAGNOSIS — IMO0002 Reserved for concepts with insufficient information to code with codable children: Secondary | ICD-10-CM | POA: Insufficient documentation

## 2014-01-24 DIAGNOSIS — F3289 Other specified depressive episodes: Secondary | ICD-10-CM | POA: Insufficient documentation

## 2014-01-24 DIAGNOSIS — F329 Major depressive disorder, single episode, unspecified: Secondary | ICD-10-CM | POA: Insufficient documentation

## 2014-01-24 DIAGNOSIS — K859 Acute pancreatitis without necrosis or infection, unspecified: Secondary | ICD-10-CM | POA: Insufficient documentation

## 2014-01-24 MED ORDER — MORPHINE SULFATE 4 MG/ML IJ SOLN
4.0000 mg | Freq: Once | INTRAMUSCULAR | Status: AC
  Administered 2014-01-25: 4 mg via INTRAVENOUS
  Filled 2014-01-24: qty 1

## 2014-01-24 MED ORDER — SODIUM CHLORIDE 0.9 % IV BOLUS (SEPSIS)
1000.0000 mL | Freq: Once | INTRAVENOUS | Status: AC
  Administered 2014-01-25: 1000 mL via INTRAVENOUS

## 2014-01-24 MED ORDER — ONDANSETRON HCL 4 MG/2ML IJ SOLN
4.0000 mg | Freq: Once | INTRAMUSCULAR | Status: AC
  Administered 2014-01-25: 4 mg via INTRAVENOUS
  Filled 2014-01-24: qty 2

## 2014-01-24 NOTE — ED Provider Notes (Addendum)
CSN: [id number]     Arrival date & time 01/24/14  2259 History   First MD Initiated Contact with Patient 01/24/14 2326    Scribed for Varney Biles, MD, the patient was seen in room MH02/MH02. This chart was scribed by Denice Bors, ED scribe. Patient's care was started at 11:44 PM   Chief Complaint  Patient presents with  . Abdominal Pain     (Consider location/radiation/quality/duration/timing/severity/associated sxs/prior Treatment) The history is provided by the patient. No language interpreter was used.   HPI Comments: Kaitlyn Johnson is a 33 y.o. female who presents to the Emergency Department with PMHx of pancreatitis (for 2 months dx) complaining of constant moderate epigastric, RUQ and LUQ abdominal pain onset chronic, but exacerbated tonight with no precipitating factors. Describes pain as "smashing sensation". Reports associated nausea. States last bowel movement was today. Reports trying dermal phenergan and PO Zofran with no relief of symptoms. Reports symptoms are exacerbated by touch. Denies associated alcohol use, fever, and emesis. Denies PMHx of stomach ulcers. Reports pancreatic stent placement 01/15/14. Reports PMHx of cushing's, recent kidney stones, cholecystomy, ERCP, and POTs.   Past Medical History  Diagnosis Date  . Von Willebrand disease     bruising easy  . Anemia 11/26/2011  . POTS (postural orthostatic tachycardia syndrome) 11/26/2011  . POTS (postural orthostatic tachycardia syndrome)   . Asthma   . Anxiety   . Depression   . H/O hiatal hernia   . Celiac and mesenteric artery injury   . Celiac and mesenteric artery injury    Past Surgical History  Procedure Laterality Date  . Abdominal hysterectomy    . Laparoscopic endometriosis fulguration    . Roux-en-y procedure    . Cholecystectomy    . Anterior cervical decomp/discectomy fusion N/A 03/31/2013    Procedure: ANTERIOR CERVICAL DECOMPRESSION/DISCECTOMY FUSION 1 LEVEL Cervical five-six;   Surgeon: Faythe Ghee, MD;  Location: [place] [facility];  Service: Neurosurgery;  Laterality: N/A;   Family History  Problem Relation Age of Onset  . Hypertension Mother    History  Substance Use Topics  . Smoking status: Never Smoker   . Smokeless tobacco: Never Used  . Alcohol Use: 3.0 oz/week    5 Glasses of wine per week   OB History   Grav Para Term Preterm Abortions TAB SAB Ect Mult Living                 Review of Systems  Constitutional: Negative for fever.  Gastrointestinal: Positive for nausea and abdominal pain.  A complete 10 system review of systems was obtained and all systems are negative except as noted in the HPI and PMHx.       Allergies  Shellfish allergy; Reglan; Wheat bran; Sulfa antibiotics; and Tartrazine  Home Medications   Current Outpatient Rx  Name  Route  Sig  Dispense  Refill  . clonazePAM (KLONOPIN) 1 MG tablet   Oral   Take 1 tablet (1 mg total) by mouth 2 (two) times daily as needed for anxiety.   30 tablet   0   . diltiazem (DILACOR XR) 180 MG 24 hr capsule   Oral   Take 180 mg by mouth every morning.          . DULoxetine (CYMBALTA) 60 MG capsule   Oral   Take 60 mg by mouth daily.         Marland Kitchen EPINEPHrine (EPIPEN) 0.3 mg/0.3 mL SOAJ injection   Intramuscular   Inject 0.3 mg  into the muscle once as needed (severe allergic reaction).         . hydrocortisone (CORTEF) 5 MG tablet   Oral   Take 5-10 mg by mouth 2 (two) times daily. Take 33m in the morning and 526min the afternoon         . hydrocortisone sodium succinate (SOLU-CORTEF) 100 mg/2 mL injection      Inject 100 mg in the muscle over 30 seconds if you cannot take hydrocortisone by mouth   5 each   prn   . lipase/protease/amylase (CREON-12/PANCREASE) 12000 UNITS CPEP capsule   Oral   Take 2 capsules by mouth 3 (three) times daily before meals.   270 capsule   1   . Lurasidone HCl (LATUDA) 20 MG TABS   Oral   Take 20 mg by mouth every evening.           . metoprolol (LOPRESSOR) 50 MG tablet   Oral   Take 75 mg by mouth 2 (two) times daily.         . montelukast (SINGULAIR) 10 MG tablet   Oral   Take 10 mg by mouth at bedtime.         . ondansetron (ZOFRAN-ODT) 4 MG disintegrating tablet   Oral   Take 1-2 tablets (4-8 mg total) by mouth every 8 (eight) hours as needed for nausea or vomiting.   20 tablet   0   . oxyCODONE-acetaminophen (PERCOCET/ROXICET) 5-325 MG per tablet   Oral   Take 1-2 tablets by mouth every 4 (four) hours as needed for severe pain.   8 tablet   0   . zolpidem (AMBIEN) 10 MG tablet   Oral   Take 1 tablet (10 mg total) by mouth at bedtime.   30 tablet   0    BP 108/76  Pulse 85  Temp(Src) 97.9 F (36.6 C) (Oral)  Resp 18  SpO2 99% Physical Exam  Nursing note and vitals reviewed. Constitutional: She is oriented to person, place, and time. She appears well-developed and well-nourished. No distress.  HENT:  Head: Normocephalic and atraumatic.  Eyes: EOM are normal.  Neck: Neck supple. No tracheal deviation present.  Cardiovascular: Normal rate and regular rhythm.   Pulmonary/Chest: Effort normal and breath sounds normal. No respiratory distress. She has no wheezes. She has no rales.  Abdominal: Soft. She exhibits no distension. There is tenderness. There is guarding. There is no rebound.  TTP of epigastrium and right and left upper quadrants with voluntary guarding.   Musculoskeletal: Normal range of motion.  Neurological: She is alert and oriented to person, place, and time.  Skin: Skin is warm and dry.  Psychiatric: She has a normal mood and affect. Her behavior is normal.    ED Course  Procedures (including critical care time)  COORDINATION OF CARE:  Nursing notes reviewed. Vital signs reviewed. Initial pt interview and examination performed.   11:53 PM-Discussed work up plan with pt at bedside, which includes  Orders Placed This Encounter  Procedures  . DG Abd 1 View    Standing  Status: Standing     Number of Occurrences: 1     Standing Expiration Date:     Order Specific Question:  Reason for exam:    Answer:  ABDOMINAL PAIN  . Comprehensive metabolic panel    Standing Status: Standing     Number of Occurrences: 1     Standing Expiration Date:   . Lipase, blood  Standing Status: Standing     Number of Occurrences: 1     Standing Expiration Date:   . Cardiac monitoring    Standing Status: Standing     Number of Occurrences: 1     Standing Expiration Date:     Pt agrees with plan.   Treatment plan initiated: Medications  sodium chloride 0.9 % bolus 1,000 mL (not administered)  morphine 4 MG/ML injection 4 mg (not administered)  ondansetron (ZOFRAN) injection 4 mg (not administered)     Initial diagnostic testing ordered.    Labs Review Labs Reviewed - No data to display Imaging Review No results found.   EKG Interpretation None      MDM   Final diagnoses:  None    I personally performed the services described in this documentation, which was scribed in my presence. The recorded information has been reviewed and is accurate.  Pt comes in with cc of abd pain. Pt has hx of cushings, on steroids, and pancreatitis, s/p ERCP on 3/20 from [facility].  She had moderate abd tenderness, no peritoneal signs. Mild lipase elevation. We spoke with [facility], and Dr. Lehman Prom and decided to get a CT scan given the clinical scenario. CT shows early pancreatitis. Given ERCP can lead to pancreatitis, and recent stenting in this patient, i spoke with the [facility] team again, and they would appreciate a transfer to [facility] for further management. Spoke with Dr. Hedda Slade, Hospitalist, and she feels that  IF PATIENT FAILS PO CHALLENGE, they will be happy to accept. PO challenge initiated.   Varney Biles, MD 01/25/14 3710  Varney Biles, MD 01/25/14 0443  5:42 AM PO challenge passed. Patient's pain is tolerable, and responded to percocet. Feels comfortable  going home. Understands need for clear liquid diet. Return precautions discussed. GI and PCP f/u recommended.  Varney Biles, MD 01/25/14 [phone]

## 2014-01-24 NOTE — ED Notes (Signed)
Pt reports having abd pain post-op  Surgery 01/15/2014  For pancreatic stent

## 2014-01-25 ENCOUNTER — Emergency Department (HOSPITAL_BASED_OUTPATIENT_CLINIC_OR_DEPARTMENT_OTHER): Payer: BC Managed Care – PPO

## 2014-01-25 LAB — CBC WITH DIFFERENTIAL/PLATELET
BASOS ABS: 0 10*3/uL (ref 0.0–0.1)
Basophils Relative: 0 % (ref 0–1)
Eosinophils Absolute: 0.2 10*3/uL (ref 0.0–0.7)
Eosinophils Relative: 2 % (ref 0–5)
HCT: 38 % (ref 36.0–46.0)
Hemoglobin: 12.4 g/dL (ref 12.0–15.0)
LYMPHS ABS: 2.9 10*3/uL (ref 0.7–4.0)
Lymphocytes Relative: 27 % (ref 12–46)
MCH: 31.6 pg (ref 26.0–34.0)
MCHC: 32.6 g/dL (ref 30.0–36.0)
MCV: 96.7 fL (ref 78.0–100.0)
MONO ABS: 1 10*3/uL (ref 0.1–1.0)
MONOS PCT: 9 % (ref 3–12)
NEUTROS PCT: 62 % (ref 43–77)
Neutro Abs: 6.7 10*3/uL (ref 1.7–7.7)
PLATELETS: 403 10*3/uL — AB (ref 150–400)
RBC: 3.93 MIL/uL (ref 3.87–5.11)
RDW: 12.6 % (ref 11.5–15.5)
WBC: 10.8 10*3/uL — AB (ref 4.0–10.5)

## 2014-01-25 LAB — COMPREHENSIVE METABOLIC PANEL
ALT: 33 U/L (ref 0–35)
AST: 29 U/L (ref 0–37)
Albumin: 3.8 g/dL (ref 3.5–5.2)
Alkaline Phosphatase: 94 U/L (ref 39–117)
BUN: 6 mg/dL (ref 6–23)
CALCIUM: 9.6 mg/dL (ref 8.4–10.5)
CO2: 25 meq/L (ref 19–32)
CREATININE: 0.8 mg/dL (ref 0.50–1.10)
Chloride: 97 mEq/L (ref 96–112)
GLUCOSE: 91 mg/dL (ref 70–99)
Potassium: 4.2 mEq/L (ref 3.7–5.3)
Sodium: 137 mEq/L (ref 137–147)
Total Protein: 7.8 g/dL (ref 6.0–8.3)

## 2014-01-25 LAB — AMYLASE: AMYLASE: 48 U/L (ref 0–105)

## 2014-01-25 LAB — LIPASE, BLOOD: Lipase: 94 U/L — ABNORMAL HIGH (ref 11–59)

## 2014-01-25 MED ORDER — HYDROMORPHONE HCL PF 1 MG/ML IJ SOLN
1.0000 mg | Freq: Once | INTRAMUSCULAR | Status: AC
  Administered 2014-01-25: 1 mg via INTRAVENOUS
  Filled 2014-01-25: qty 1

## 2014-01-25 MED ORDER — LORAZEPAM 1 MG PO TABS: 2.0000 mg | ORAL_TABLET | Freq: Once | ORAL | Status: DC

## 2014-01-25 MED ORDER — PROMETHAZINE HCL 25 MG/ML IJ SOLN
25.0000 mg | Freq: Once | INTRAMUSCULAR | Status: AC
  Administered 2014-01-25: 25 mg via INTRAVENOUS
  Filled 2014-01-25: qty 1

## 2014-01-25 MED ORDER — OXYCODONE-ACETAMINOPHEN 5-325 MG PO TABS: 1.0000 | ORAL_TABLET | Freq: Four times a day (QID) | ORAL | Status: DC | PRN

## 2014-01-25 MED ORDER — LORAZEPAM 2 MG/ML IJ SOLN: 1.0000 mg | Freq: Once | INTRAMUSCULAR | Status: DC

## 2014-01-25 MED ORDER — PROMETHAZINE HCL 25 MG PO TABS: 25.0000 mg | ORAL_TABLET | Freq: Four times a day (QID) | ORAL | Status: DC | PRN

## 2014-01-25 MED ORDER — PROMETHAZINE HCL 25 MG RE SUPP: 25.0000 mg | Freq: Four times a day (QID) | RECTAL | Status: DC | PRN

## 2014-01-25 MED ORDER — PROMETHAZINE HCL 25 MG PO TABS
25.0000 mg | ORAL_TABLET | Freq: Once | ORAL | Status: AC
  Administered 2014-01-25: 25 mg via ORAL
  Filled 2014-01-25: qty 1

## 2014-01-25 MED ORDER — PROMETHAZINE HCL 25 MG PO TABS
12.5000 mg | ORAL_TABLET | Freq: Once | ORAL | Status: AC
  Administered 2014-01-25: 12.5 mg via ORAL
  Filled 2014-01-25: qty 1

## 2014-01-25 MED ORDER — OXYCODONE-ACETAMINOPHEN 5-325 MG PO TABS
2.0000 | ORAL_TABLET | Freq: Once | ORAL | Status: AC
  Administered 2014-01-25: 2 via ORAL
  Filled 2014-01-25: qty 2

## 2014-01-25 MED ORDER — IOHEXOL 300 MG/ML  SOLN
50.0000 mL | Freq: Once | INTRAMUSCULAR | Status: AC | PRN
  Administered 2014-01-25: 50 mL via ORAL

## 2014-01-25 MED ORDER — IOHEXOL 300 MG/ML  SOLN
100.0000 mL | Freq: Once | INTRAMUSCULAR | Status: AC | PRN
  Administered 2014-01-25: 100 mL via INTRAVENOUS

## 2014-01-25 MED ORDER — LORAZEPAM 2 MG/ML IJ SOLN
1.0000 mg | Freq: Once | INTRAMUSCULAR | Status: AC
  Administered 2014-01-25: 1 mg via INTRAVENOUS
  Filled 2014-01-25: qty 1

## 2014-01-25 NOTE — ED Notes (Signed)
Per EDP - pt will not be transferred to [facility] at this time. Plan for pt to be discharged to home.

## 2014-01-25 NOTE — Discharge Instructions (Signed)
You have a mild or an early pancreatitis. Please expect persistent pain, until things calm down. Clear liquid diet for now, advance as tolerated.   Acute Pancreatitis Acute pancreatitis is a disease in which the pancreas becomes suddenly inflamed. The pancreas is a large gland located behind your stomach. The pancreas produces enzymes that help digest food. The pancreas also releases the hormones glucagon and insulin that help regulate blood sugar. Damage to the pancreas occurs when the digestive enzymes from the pancreas are activated and begin attacking the pancreas before being released into the intestine. Most acute attacks last a couple of days and can cause serious complications. Some people become dehydrated and develop low blood pressure. In severe cases, bleeding into the pancreas can lead to shock and can be life-threatening. The lungs, heart, and kidneys may fail. CAUSES  Pancreatitis can happen to anyone. In some cases, the cause is unknown. Most cases are caused by:  Alcohol abuse.  Gallstones. Other less common causes are:  Certain medicines.  Exposure to certain chemicals.  Infection.  Damage caused by an accident (trauma).  Abdominal surgery. SYMPTOMS   Pain in the upper abdomen that may radiate to the back.  Tenderness and swelling of the abdomen.  Nausea and vomiting. DIAGNOSIS  Your caregiver will perform a physical exam. Blood and stool tests may be done to confirm the diagnosis. Imaging tests may also be done, such as X-rays, CT scans, or an ultrasound of the abdomen. TREATMENT  Treatment usually requires a stay in the hospital. Treatment may include:  Pain medicine.  Fluid replacement through an intravenous line (IV).  Placing a tube in the stomach to remove stomach contents and control vomiting.  Not eating for 3 or 4 days. This gives your pancreas a rest, because enzymes are not being produced that can cause further damage.  Antibiotic medicines if  your condition is caused by an infection.  Surgery of the pancreas or gallbladder. HOME CARE INSTRUCTIONS   Follow the diet advised by your caregiver. This may involve avoiding alcohol and decreasing the amount of fat in your diet.  Eat smaller, more frequent meals. This reduces the amount of digestive juices the pancreas produces.  Drink enough fluids to keep your urine clear or pale yellow.  Only take over-the-counter or prescription medicines as directed by your caregiver.  Avoid drinking alcohol if it caused your condition.  Do not smoke.  Get plenty of rest.  Check your blood sugar at home as directed by your caregiver.  Keep all follow-up appointments as directed by your caregiver. SEEK MEDICAL CARE IF:   You do not recover as quickly as expected.  You develop new or worsening symptoms.  You have persistent pain, weakness, or nausea.  You recover and then have another episode of pain. SEEK IMMEDIATE MEDICAL CARE IF:   You are unable to eat or keep fluids down.  Your pain becomes severe.  You have a fever or persistent symptoms for more than 2 to 3 days.  You have a fever and your symptoms suddenly get worse.  Your skin or the white part of your eyes turn yellow (jaundice).  You develop vomiting.  You feel dizzy, or you faint.  Your blood sugar is high (over 300 mg/dL). MAKE SURE YOU:   Understand these instructions.  Will watch your condition.  Will get help right away if you are not doing well or get worse. Document Released: 10/15/2005 Document Revised: 04/15/2012 Document Reviewed: 01/24/2012 Exit[employer] Patient Information 2014  [employer]. Diet The clear liquid diet consists of foods that are liquid or will become liquid at room temperature. Examples of foods allowed on a clear liquid diet include fruit juice, broth or bouillon, gelatin, or frozen ice pops. You should be able to see through the liquid. The purpose of this diet is to provide the  necessary fluids, electrolytes (such as sodium and potassium), and energy to keep the body functioning during times when you are not able to consume a regular diet. A clear liquid diet should not be continued for long periods of time, as it is not nutritionally adequate.  A CLEAR LIQUID DIET MAY BE NEEDED:  When a sudden-onset (acute) condition occurs before or after surgery.   As the first step in oral feeding.   For fluid and electrolyte replacement in diarrheal diseases.   As a diet before certain medical tests are performed.  ADEQUACY The clear liquid diet is adequate only in ascorbic acid, according to the Recommended Dietary Allowances of the [employer].  CHOOSING FOODS Breads and Starches  Allowed: None are allowed.   Avoid: All are to be avoided.  Vegetables  Allowed: Strained vegetable juices.   Avoid: Any others.  Fruit  Allowed: Strained fruit juices and fruit drinks. Include 1 serving of citrus or vitamin C-enriched fruit juice daily.   Avoid: Any others.  Meat and Meat Substitutes  Allowed: None are allowed.   Avoid: All are to be avoided.  Milk Products  Allowed: None are allowed.   Avoid: All are to be avoided.  Soups and Combination Foods  Allowed: Clear bouillon, broth, or strained broth-based soups.   Avoid: Any others.  Desserts and Sweets  Allowed: Sugar, honey. High-protein gelatin. Flavored gelatin, ices, or frozen ice pops that do not contain milk.   Avoid: Any others.  Fats and Oils  Allowed: None are allowed.   Avoid: All are to be avoided.  Beverages  Allowed: Cereal beverages, coffee (regular or decaffeinated), tea, or soda at the discretion of your health care provider.   Avoid: Any others.  Condiments  Allowed: Salt.   Avoid: Any others, including pepper.  Supplements  Allowed: Liquid nutrition beverages that you can see through.   Avoid: Any others  that contain lactose or fiber. SAMPLE MEAL PLAN Breakfast  4 oz (120 mL) strained orange juice.   to 1 cup (120 to 240 mL) gelatin (plain or fortified).  1 cup (240 mL) beverage (coffee or tea).  Sugar, if desired. Midmorning Snack   cup (120 mL) gelatin (plain or fortified). Lunch  1 cup (240 mL) broth or consomm.  4 oz (120 mL) strained grapefruit juice.   cup (120 mL) gelatin (plain or fortified).  1 cup (240 mL) beverage (coffee or tea).  Sugar, if desired. Midafternoon Snack   cup (120 mL) fruit ice.   cup (120 mL) strained fruit juice. Dinner  1 cup (240 mL) broth or consomm.   cup (120 mL) cranberry juice.   cup (120 mL) flavored gelatin (plain or fortified).  1 cup (240 mL) beverage (coffee or tea).  Sugar, if desired. Evening Snack  4 oz (120 mL) strained apple juice (vitamin C-fortified).   cup (120 mL) flavored gelatin (plain or fortified). MAKE SURE YOU:  Understand these instructions.  Will watch your child's condition.  Will get help right away if your child is not doing well or gets worse. Document Released: 10/15/2005 Document Revised: 06/17/2013 Document Reviewed: 03/17/2013 ExitCare Patient Information 2014  [employer].

## 2014-03-30 ENCOUNTER — Ambulatory Visit: Payer: BC Managed Care – PPO | Admitting: Internal Medicine

## 2014-03-30 DIAGNOSIS — Z0289 Encounter for other administrative examinations: Secondary | ICD-10-CM

## 2014-05-27 ENCOUNTER — Encounter (HOSPITAL_BASED_OUTPATIENT_CLINIC_OR_DEPARTMENT_OTHER): Payer: Self-pay | Admitting: Emergency Medicine

## 2014-05-27 ENCOUNTER — Emergency Department (HOSPITAL_BASED_OUTPATIENT_CLINIC_OR_DEPARTMENT_OTHER)
Admission: EM | Admit: 2014-05-27 | Discharge: 2014-05-27 | Disposition: A | Discharge status: Home / Self Care | Payer: BC Managed Care – PPO | Attending: Emergency Medicine | Admitting: Emergency Medicine

## 2014-05-27 ENCOUNTER — Other Ambulatory Visit: Payer: Self-pay

## 2014-05-27 DIAGNOSIS — R42 Dizziness and giddiness: Secondary | ICD-10-CM

## 2014-05-27 DIAGNOSIS — F3289 Other specified depressive episodes: Secondary | ICD-10-CM | POA: Insufficient documentation

## 2014-05-27 DIAGNOSIS — F411 Generalized anxiety disorder: Secondary | ICD-10-CM | POA: Insufficient documentation

## 2014-05-27 DIAGNOSIS — Z862 Personal history of diseases of the blood and blood-forming organs and certain disorders involving the immune mechanism: Secondary | ICD-10-CM | POA: Insufficient documentation

## 2014-05-27 DIAGNOSIS — IMO0002 Reserved for concepts with insufficient information to code with codable children: Secondary | ICD-10-CM | POA: Insufficient documentation

## 2014-05-27 DIAGNOSIS — Z79899 Other long term (current) drug therapy: Secondary | ICD-10-CM | POA: Insufficient documentation

## 2014-05-27 DIAGNOSIS — E249 Cushing's syndrome, unspecified: Secondary | ICD-10-CM | POA: Insufficient documentation

## 2014-05-27 DIAGNOSIS — Z87828 Personal history of other (healed) physical injury and trauma: Secondary | ICD-10-CM | POA: Insufficient documentation

## 2014-05-27 DIAGNOSIS — J45909 Unspecified asthma, uncomplicated: Secondary | ICD-10-CM | POA: Insufficient documentation

## 2014-05-27 DIAGNOSIS — F329 Major depressive disorder, single episode, unspecified: Secondary | ICD-10-CM | POA: Insufficient documentation

## 2014-05-27 LAB — BASIC METABOLIC PANEL
Anion gap: 16 — ABNORMAL HIGH (ref 5–15)
BUN: 7 mg/dL (ref 6–23)
CHLORIDE: 98 meq/L (ref 96–112)
CO2: 24 mEq/L (ref 19–32)
CREATININE: 0.7 mg/dL (ref 0.50–1.10)
Calcium: 9.5 mg/dL (ref 8.4–10.5)
GFR calc Af Amer: >90 mL/min (ref >90)
GFR calc non Af Amer: >90 mL/min (ref >90)
Glucose, Bld: 102 mg/dL — ABNORMAL HIGH (ref 70–99)
POTASSIUM: 4 meq/L (ref 3.7–5.3)
Sodium: 138 mEq/L (ref 137–147)

## 2014-05-27 LAB — CBC WITH DIFFERENTIAL/PLATELET
Basophils Absolute: 0 10*3/uL (ref 0.0–0.1)
Basophils Relative: 0 % (ref 0–1)
Eosinophils Absolute: 0.1 10*3/uL (ref 0.0–0.7)
Eosinophils Relative: 1 % (ref 0–5)
HEMATOCRIT: 36.5 % (ref 36.0–46.0)
Hemoglobin: 12.3 g/dL (ref 12.0–15.0)
LYMPHS ABS: 1.9 10*3/uL (ref 0.7–4.0)
Lymphocytes Relative: 20 % (ref 12–46)
MCH: 33.2 pg (ref 26.0–34.0)
MCHC: 33.7 g/dL (ref 30.0–36.0)
MCV: 98.6 fL (ref 78.0–100.0)
MONO ABS: 0.6 10*3/uL (ref 0.1–1.0)
MONOS PCT: 6 % (ref 3–12)
NEUTROS ABS: 7.1 10*3/uL (ref 1.7–7.7)
Neutrophils Relative %: 73 % (ref 43–77)
Platelets: 294 10*3/uL (ref 150–400)
RBC: 3.7 MIL/uL — AB (ref 3.87–5.11)
RDW: 12.8 % (ref 11.5–15.5)
WBC: 9.7 10*3/uL (ref 4.0–10.5)

## 2014-05-27 MED ORDER — ONDANSETRON HCL 4 MG/2ML IJ SOLN
4.0000 mg | Freq: Once | INTRAMUSCULAR | Status: AC
  Administered 2014-05-27: 4 mg via INTRAVENOUS
  Filled 2014-05-27: qty 2

## 2014-05-27 MED ORDER — SODIUM CHLORIDE 0.9 % IV BOLUS (SEPSIS)
1000.0000 mL | Freq: Once | INTRAVENOUS | Status: AC
  Administered 2014-05-27: 1000 mL via INTRAVENOUS

## 2014-05-27 NOTE — ED Notes (Signed)
Dizziness and nausea since Sunday. [facility] was [facility] and had an MRI for r/o pituitary tumor but does not have the results.

## 2014-05-27 NOTE — ED Notes (Signed)
Fax[facility] request for MRI from [facility]

## 2014-05-27 NOTE — ED Provider Notes (Signed)
CSN: [id number]     Arrival date & time 05/27/14  1426 History   First MD Initiated Contact with Patient 05/27/14 1502     Chief Complaint  Patient presents with  . Dizziness     (Consider location/radiation/quality/duration/timing/severity/associated sxs/prior Treatment) HPI 33 y.o. 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  She states it is worse when she gets up and feels like a snow globe in her head.  She does have a history of POTS but states this has been well controlled for 12 years since diagnosis at age 57.  She has been on a steady medication regime except for the cortef taper she is currently undergoing under the supervision of her endocrinologist at [facility].  She states she had an MRI yesterday due to an elevated prolactin level.  She denies any vomiting, diarrhea, change in po intake, bleeding from her gi track- she does not remember her last hgb but states all her labs were normal.  She denies chest pain, dyspnea, abdominal pain, fever, uri symptoms, or ear problems.    Past Medical History  Diagnosis Date  . Von Willebrand disease     bruising easy  . Anemia 11/26/2011  . POTS (postural orthostatic tachycardia syndrome) 11/26/2011  . POTS (postural orthostatic tachycardia syndrome)   . Asthma   . Anxiety   . Depression   . H/O hiatal hernia   . Celiac and mesenteric artery injury   . Celiac and mesenteric artery injury    Past Surgical History  Procedure Laterality Date  . Abdominal hysterectomy    . Laparoscopic endometriosis fulguration    . Roux-en-y procedure    . Cholecystectomy    . Anterior cervical decomp/discectomy fusion N/A 03/31/2013    Procedure: ANTERIOR CERVICAL DECOMPRESSION/DISCECTOMY FUSION 1 LEVEL Cervical five-six;  Surgeon: Faythe Ghee, MD;  Location: [place] [facility];  Service: Neurosurgery;  Laterality: N/A;   Family History  Problem Relation Age of  Onset  . Hypertension Mother    History  Substance Use Topics  . Smoking status: Never Smoker   . Smokeless tobacco: Never Used  . Alcohol Use: 3.0 oz/week    5 Glasses of wine per week   OB History   Grav Para Term Preterm Abortions TAB SAB Ect Mult Living                 Review of Systems  All other systems reviewed and are negative.     Allergies  Shellfish allergy; Reglan; Wheat bran; Sulfa antibiotics; and Tartrazine  Home Medications   Prior to Admission medications   Medication Sig Start Date End Date Taking? Authorizing Provider  clonazePAM (KLONOPIN) 1 MG tablet Take 1 tablet (1 mg total) by mouth 2 (two) times daily as needed for anxiety. 12/07/13   Theodis Blaze, MD  diltiazem (DILACOR XR) 180 MG 24 hr capsule Take 180 mg by mouth every morning.     Historical Provider, MD  DULoxetine (CYMBALTA) 60 MG capsule Take 60 mg by mouth daily.    Historical Provider, MD  EPINEPHrine (EPIPEN) 0.3 mg/0.3 mL SOAJ injection Inject 0.3 mg into the muscle once as needed (severe allergic reaction).    Historical Provider, MD  hydrocortisone (CORTEF) 5 MG tablet Take 5-10 mg by mouth 2 (two) times daily. Take 59m in the morning and 538min the afternoon    Historical Provider, MD  hydrocortisone sodium succinate (SOLU-CORTEF) 100 mg/2  mL injection Inject 100 mg in the muscle over 30 seconds if you cannot take hydrocortisone by mouth 10/01/13   Philemon Kingdom, MD  lipase/protease/amylase (CREON-12/PANCREASE) 12000 UNITS CPEP capsule Take 2 capsules by mouth 3 (three) times daily before meals. 12/07/13   Theodis Blaze, MD  Lurasidone HCl (LATUDA) 20 MG TABS Take 20 mg by mouth every evening.     Historical Provider, MD  metoprolol (LOPRESSOR) 50 MG tablet Take 75 mg by mouth 2 (two) times daily.    Historical Provider, MD  montelukast (SINGULAIR) 10 MG tablet Take 10 mg by mouth at bedtime.    Historical Provider, MD  ondansetron (ZOFRAN-ODT) 4 MG disintegrating tablet Take 1-2 tablets  (4-8 mg total) by mouth every 8 (eight) hours as needed for nausea or vomiting. 12/07/13   Theodis Blaze, MD  oxyCODONE-acetaminophen (PERCOCET/ROXICET) 5-325 MG per tablet Take 1-2 tablets by mouth every 4 (four) hours as needed for severe pain. 12/28/13   Lucila Maine, PA-C  oxyCODONE-acetaminophen (PERCOCET/ROXICET) 5-325 MG per tablet Take 1 tablet by mouth every 6 (six) hours as needed for severe pain. 01/25/14   Varney Biles, MD  promethazine (PHENERGAN) 25 MG suppository Place 1 suppository (25 mg total) rectally every 6 (six) hours as needed for nausea. 01/25/14   Varney Biles, MD  zolpidem (AMBIEN) 10 MG tablet Take 1 tablet (10 mg total) by mouth at bedtime. 12/07/13   Theodis Blaze, MD   BP 139/94  Pulse 87  Temp(Src) 97.8 F (36.6 C) (Oral)  Resp 22  SpO2 99% Physical Exam  Nursing note and vitals reviewed. Constitutional: She is oriented to person, place, and time. She appears well-developed and well-nourished.  HENT:  Head: Normocephalic and atraumatic.  Right Ear: Tympanic membrane and external ear normal.  Left Ear: Tympanic membrane and external ear normal.  Nose: Nose normal. Right sinus exhibits no maxillary sinus tenderness and no frontal sinus tenderness. Left sinus exhibits no maxillary sinus tenderness and no frontal sinus tenderness.  Eyes: Conjunctivae and EOM are normal. Pupils are equal, round, and reactive to light. Right eye exhibits no nystagmus. Left eye exhibits no nystagmus.  Neck: Normal range of motion. Neck supple.  Cardiovascular: Normal rate, regular rhythm, normal heart sounds and intact distal pulses.   Pulmonary/Chest: Effort normal and breath sounds normal. No respiratory distress. She exhibits no tenderness.  Abdominal: Soft. Bowel sounds are normal. She exhibits no distension and no mass. There is no tenderness.  Musculoskeletal: Normal range of motion. She exhibits no edema and no tenderness.  Neurological: She is alert and oriented to person,  place, and time. She has normal strength and normal reflexes. No sensory deficit. She displays a negative Romberg sign. GCS eye subscore is 4. GCS verbal subscore is 5. GCS motor subscore is 6.  Reflex Scores:      Tricep reflexes are 2+ on the right side and 2+ on the left side.      Bicep reflexes are 2+ on the right side and 2+ on the left side.      Brachioradialis reflexes are 2+ on the right side and 2+ on the left side.      Patellar reflexes are 2+ on the right side and 2+ on the left side.      Achilles reflexes are 2+ on the right side and 2+ on the left side. Patient with normal gait without ataxia, shuffling, spasm, or antalgia. Speech is normal without dysarthria, dysphasia, or aphasia. Muscle strength is 5/5  in bilateral shoulders, elbow flexor and extensors, wrist flexor and extensors, and intrinsic hand muscles. 5/5 bilateral lower extremity hip flexors, extensors, knee flexors and extensors, and ankle dorsi and plantar flexors.    Skin: Skin is warm and dry. No rash noted.  Psychiatric: She has a normal mood and affect. Her behavior is normal. Judgment and thought content normal.    ED Course  Procedures (including critical care time) Labs Review Labs Reviewed  CBC WITH DIFFERENTIAL - Abnormal; Notable for the following:    RBC 3.70 (*)    All other components within normal limits  BASIC METABOLIC PANEL - Abnormal; Notable for the following:    Glucose, Bld 102 (*)    Anion gap 16 (*)    All other components within normal limits    Imaging Review No results found.   EKG Interpretation   Date/Time:  Thursday May 27 2014 15:52:15 EDT Ventricular Rate:  84 PR Interval:  148 QRS Duration: 84 QT Interval:  384 QTC Calculation: 453 R Axis:   69 Text Interpretation:  Normal sinus rhythm Normal ECG Confirmed by Cissy Galbreath MD,  Andee Poles ([id number]) on 05/27/2014 4:33:09 PM      MDM   Final diagnoses:  Dizziness   Patient with mild hr changes with lying to standing with  normal bp.  OW labs normal.  Patient advised regarding staying hydrated and return precautions and need for follow up discussed and patient voices understanding.   Shaune Pollack, MD 05/27/14 [phone]

## 2014-05-27 NOTE — Discharge Instructions (Signed)

## 2014-05-27 NOTE — ED Notes (Signed)
Pt is able to walk without difficulty.

## 2014-08-13 ENCOUNTER — Other Ambulatory Visit: Payer: Self-pay

## 2014-08-15 ENCOUNTER — Emergency Department (HOSPITAL_BASED_OUTPATIENT_CLINIC_OR_DEPARTMENT_OTHER)
Admission: EM | Admit: 2014-08-15 | Discharge: 2014-08-16 | Disposition: A | Discharge status: Home / Self Care | Payer: BC Managed Care – PPO | Attending: Emergency Medicine | Admitting: Emergency Medicine

## 2014-08-15 ENCOUNTER — Encounter (HOSPITAL_BASED_OUTPATIENT_CLINIC_OR_DEPARTMENT_OTHER): Payer: Self-pay | Admitting: Emergency Medicine

## 2014-08-15 DIAGNOSIS — F329 Major depressive disorder, single episode, unspecified: Secondary | ICD-10-CM | POA: Diagnosis not present

## 2014-08-15 DIAGNOSIS — Z8719 Personal history of other diseases of the digestive system: Secondary | ICD-10-CM | POA: Insufficient documentation

## 2014-08-15 DIAGNOSIS — Z862 Personal history of diseases of the blood and blood-forming organs and certain disorders involving the immune mechanism: Secondary | ICD-10-CM | POA: Insufficient documentation

## 2014-08-15 DIAGNOSIS — Z79899 Other long term (current) drug therapy: Secondary | ICD-10-CM | POA: Diagnosis not present

## 2014-08-15 DIAGNOSIS — Z9089 Acquired absence of other organs: Secondary | ICD-10-CM | POA: Diagnosis not present

## 2014-08-15 DIAGNOSIS — R1032 Left lower quadrant pain: Secondary | ICD-10-CM | POA: Insufficient documentation

## 2014-08-15 DIAGNOSIS — Z8679 Personal history of other diseases of the circulatory system: Secondary | ICD-10-CM | POA: Insufficient documentation

## 2014-08-15 DIAGNOSIS — Z9049 Acquired absence of other specified parts of digestive tract: Secondary | ICD-10-CM | POA: Diagnosis not present

## 2014-08-15 DIAGNOSIS — R1012 Left upper quadrant pain: Secondary | ICD-10-CM

## 2014-08-15 DIAGNOSIS — R109 Unspecified abdominal pain: Secondary | ICD-10-CM | POA: Diagnosis present

## 2014-08-15 DIAGNOSIS — J45909 Unspecified asthma, uncomplicated: Secondary | ICD-10-CM | POA: Insufficient documentation

## 2014-08-15 DIAGNOSIS — F419 Anxiety disorder, unspecified: Secondary | ICD-10-CM | POA: Insufficient documentation

## 2014-08-15 DIAGNOSIS — Z7952 Long term (current) use of systemic steroids: Secondary | ICD-10-CM | POA: Insufficient documentation

## 2014-08-15 HISTORY — DX: Spasm of sphincter of Oddi: K83.4

## 2014-08-15 LAB — CBC WITH DIFFERENTIAL/PLATELET
Basophils Absolute: 0 10*3/uL (ref 0.0–0.1)
Basophils Relative: 0 % (ref 0–1)
EOS ABS: 0.1 10*3/uL (ref 0.0–0.7)
EOS PCT: 1 % (ref 0–5)
HCT: 34.7 % — ABNORMAL LOW (ref 36.0–46.0)
HEMOGLOBIN: 11.5 g/dL — AB (ref 12.0–15.0)
Lymphocytes Relative: 30 % (ref 12–46)
Lymphs Abs: 2.6 10*3/uL (ref 0.7–4.0)
MCH: 32.7 pg (ref 26.0–34.0)
MCHC: 33.1 g/dL (ref 30.0–36.0)
MCV: 98.6 fL (ref 78.0–100.0)
MONOS PCT: 7 % (ref 3–12)
Monocytes Absolute: 0.6 10*3/uL (ref 0.1–1.0)
Neutro Abs: 5.2 10*3/uL (ref 1.7–7.7)
Neutrophils Relative %: 62 % (ref 43–77)
Platelets: 261 10*3/uL (ref 150–400)
RBC: 3.52 MIL/uL — ABNORMAL LOW (ref 3.87–5.11)
RDW: 12.6 % (ref 11.5–15.5)
WBC: 8.6 10*3/uL (ref 4.0–10.5)

## 2014-08-15 MED ORDER — SODIUM CHLORIDE 0.9 % IV SOLN
INTRAVENOUS | Status: DC
  Administered 2014-08-15: via INTRAVENOUS

## 2014-08-15 MED ORDER — PROMETHAZINE HCL 25 MG/ML IJ SOLN
12.5000 mg | Freq: Once | INTRAMUSCULAR | Status: AC
  Administered 2014-08-15: 12.5 mg via INTRAVENOUS
  Filled 2014-08-15: qty 1

## 2014-08-15 MED ORDER — FENTANYL CITRATE 0.05 MG/ML IJ SOLN
100.0000 ug | Freq: Once | INTRAMUSCULAR | Status: AC
Start: 2014-08-15 — End: 2014-08-15
  Administered 2014-08-15: 100 ug via INTRAVENOUS
  Filled 2014-08-15: qty 2

## 2014-08-15 NOTE — ED Provider Notes (Signed)
CSN: [id number]     Arrival date & time 08/15/14  2230 History  This chart was scribed for Kaitlyn Seamen, MD by Roxy Cedar, ED Scribe. This patient was seen in room MH12/MH12 and the patient's care was started at 11:02 PM.   Chief Complaint  Patient presents with  . Abdominal Pain   Patient is a 33 y.o. female presenting with abdominal pain. The history is provided by the patient. No language interpreter was used.  Abdominal Pain  HPI Comments: Kaitlyn Johnson is a 33 y.o. female with a history of Roux-en-Y for mesenteric ischemia, hysterectomy, sphincter of Oddi repair and pancreatitis with placement of pancreatic stent (subsequently passed) She presents to the Emergency Department complaining of LUQ pain characterized as like previous pancreatitis. She describes her pain as sharp, stabbing pain that radiates to her back. She states that her pain increases when she sneezes. She denies pain with movement or breathing. She currently rates her pain as 7/10. She reports associated nausea.   Patient states that she took zofran 3 hours ago during onset of symptoms with minimal relief. Patient denies associated fever, vomiting, diarrhea, constipation or urinary changes. Patient states that no other medications were taken prior to arrival.   She's been off steroids for about 8 weeks.  Past Medical History  Diagnosis Date  . Von Willebrand disease     bruising easy  . Anemia 11/26/2011  . POTS (postural orthostatic tachycardia syndrome)   . Asthma   . Anxiety   . Depression   . H/O hiatal hernia   . Celiac and mesenteric artery injury   . Sphincter of Oddi dysfunction    Past Surgical History  Procedure Laterality Date  . Abdominal hysterectomy    . Laparoscopic endometriosis fulguration    . Roux-en-y procedure    . Cholecystectomy    . Anterior cervical decomp/discectomy fusion N/A 03/31/2013    Procedure: ANTERIOR CERVICAL DECOMPRESSION/DISCECTOMY FUSION 1 LEVEL Cervical  five-six;  Surgeon: Reinaldo Meeker, MD;  Location: [facility];  Service: Neurosurgery;  Laterality: N/A;   Family History  Problem Relation Age of Onset  . Hypertension Mother    History  Substance Use Topics  . Smoking status: Never Smoker   . Smokeless tobacco: Never Used  . Alcohol Use: 3.0 oz/week    5 Glasses of wine per week   OB History   Grav Para Term Preterm Abortions TAB SAB Ect Mult Living                 Review of Systems  Gastrointestinal: Positive for abdominal pain.   A complete 10 system review of systems was obtained and all systems are negative except as noted in the HPI and PMH.   Allergies  Shellfish allergy; Reglan; Wheat bran; Sulfa antibiotics; and Tartrazine  Home Medications   Prior to Admission medications   Medication Sig Start Date End Date Taking? Authorizing Provider  clonazePAM (KLONOPIN) 1 MG tablet Take 1 tablet (1 mg total) by mouth 2 (two) times daily as needed for anxiety. 12/07/13  Yes Dorothea Ogle, MD  diltiazem (DILACOR XR) 180 MG 24 hr capsule Take 180 mg by mouth every morning.    Yes Historical Provider, MD  DULoxetine (CYMBALTA) 60 MG capsule Take 60 mg by mouth daily.   Yes Historical Provider, MD  Lurasidone HCl (LATUDA) 20 MG TABS Take 20 mg by mouth every evening.    Yes Historical Provider, MD  metoprolol (LOPRESSOR) 50 MG tablet  Take 75 mg by mouth 2 (two) times daily.   Yes Historical Provider, MD  mirtazapine (REMERON) 30 MG tablet Take 30 mg by mouth at bedtime.   Yes Historical Provider, MD  montelukast (SINGULAIR) 10 MG tablet Take 10 mg by mouth at bedtime.   Yes Historical Provider, MD  ondansetron (ZOFRAN-ODT) 4 MG disintegrating tablet Take 1-2 tablets (4-8 mg total) by mouth every 8 (eight) hours as needed for nausea or vomiting. 12/07/13  Yes Dorothea Ogle, MD  zolpidem (AMBIEN) 10 MG tablet Take 1 tablet (10 mg total) by mouth at bedtime. 12/07/13  Yes Dorothea Ogle, MD  EPINEPHrine (EPIPEN) 0.3 mg/0.3 mL SOAJ  injection Inject 0.3 mg into the muscle once as needed (severe allergic reaction).    Historical Provider, MD  hydrocortisone (CORTEF) 5 MG tablet Take 5-10 mg by mouth 2 (two) times daily. Take 10mg  in the morning and 5mg  in the afternoon    Historical Provider, MD  hydrocortisone sodium succinate (SOLU-CORTEF) 100 mg/2 mL injection Inject 100 mg in the muscle over 30 seconds if you cannot take hydrocortisone by mouth 10/01/13   Carlus Pavlov, MD  lipase/protease/amylase (CREON-12/PANCREASE) 12000 UNITS CPEP capsule Take 2 capsules by mouth 3 (three) times daily before meals. 12/07/13   Dorothea Ogle, MD  oxyCODONE-acetaminophen (PERCOCET/ROXICET) 5-325 MG per tablet Take 1-2 tablets by mouth every 4 (four) hours as needed for severe pain. 12/28/13   Jillyn Ledger, PA-C  oxyCODONE-acetaminophen (PERCOCET/ROXICET) 5-325 MG per tablet Take 1 tablet by mouth every 6 (six) hours as needed for severe pain. 01/25/14   Derwood Kaplan, MD  promethazine (PHENERGAN) 25 MG suppository Place 1 suppository (25 mg total) rectally every 6 (six) hours as needed for nausea. 01/25/14   Derwood Kaplan, MD   Triage Vitals: BP 129/95  Pulse 87  Temp(Src) 98 F (36.7 C) (Oral)  Resp 20  Ht 5\' 9"  (1.753 m)  Wt 222 lb (100.699 kg)  BMI 32.77 kg/m2  SpO2 99%  Physical Exam  Nursing note and vitals reviewed. General: Well-developed, well-nourished female in no acute distress; appearance consistent with age of record HENT: normocephalic; atraumatic; Cushingoid facies Eyes: pupils equal, round and reactive to light; extraocular muscles intact Neck: supple Heart: regular rate and rhythm; no murmurs, rubs or gallops Lungs: clear to auscultation bilaterally Abdomen: soft; nondistended; LUQ tenderness; no masses or hepatosplenomegaly; bowel sounds present Extremities: No deformity; full range of motion; pulses normal Neurologic: Awake, alert and oriented; motor function intact in all extremities and symmetric; no facial  droop Skin: warm and dry Psychiatric: Normal mood and affect  ED Course  Procedures (including critical care time) DIAGNOSTIC STUDIES: Oxygen Saturation is 99% on RA, normal by my interpretation.    COORDINATION OF CARE: 10:56 PM- Discussed plans to obtain diagnostic lab work and urinalysis. Pt advised of plan for treatment and pt agrees.  MDM   Nursing notes and vitals signs, including pulse oximetry, reviewed.  Summary of this visit's results, reviewed by myself:  Labs:  Results for orders placed during the hospital encounter of 08/15/14 (from the past 24 hour(s))  CBC WITH DIFFERENTIAL     Status: Abnormal   Collection Time    08/15/14 11:30 PM      Result Value Ref Range   WBC 8.6  4.0 - 10.5 K/uL   RBC 3.52 (*) 3.87 - 5.11 MIL/uL   Hemoglobin 11.5 (*) 12.0 - 15.0 g/dL   HCT 29.5 (*) 62.1 - 30.8 %   MCV  98.6  78.0 - 100.0 fL   MCH 32.7  26.0 - 34.0 pg   MCHC 33.1  30.0 - 36.0 g/dL   RDW 16.112.6  09.611.5 - 04.515.5 %   Platelets 261  150 - 400 K/uL   Neutrophils Relative % 62  43 - 77 %   Neutro Abs 5.2  1.7 - 7.7 K/uL   Lymphocytes Relative 30  12 - 46 %   Lymphs Abs 2.6  0.7 - 4.0 K/uL   Monocytes Relative 7  3 - 12 %   Monocytes Absolute 0.6  0.1 - 1.0 K/uL   Eosinophils Relative 1  0 - 5 %   Eosinophils Absolute 0.1  0.0 - 0.7 K/uL   Basophils Relative 0  0 - 1 %   Basophils Absolute 0.0  0.0 - 0.1 K/uL  COMPREHENSIVE METABOLIC PANEL     Status: Abnormal   Collection Time    08/15/14 11:30 PM      Result Value Ref Range   Sodium 138  137 - 147 mEq/L   Potassium 3.8  3.7 - 5.3 mEq/L   Chloride 100  96 - 112 mEq/L   CO2 22  19 - 32 mEq/L   Glucose, Bld 98  70 - 99 mg/dL   BUN 17  6 - 23 mg/dL   Creatinine, Ser 4.090.90  0.50 - 1.10 mg/dL   Calcium 9.5  8.4 - 81.110.5 mg/dL   Total Protein 6.8  6.0 - 8.3 g/dL   Albumin 3.5  3.5 - 5.2 g/dL   AST 16  0 - 37 U/L   ALT 18  0 - 35 U/L   Alkaline Phosphatase 95  39 - 117 U/L   Total Bilirubin <0.2 (*) 0.3 - 1.2 mg/dL   GFR  calc non Af Amer 85 (*) >90 mL/min   GFR calc Af Amer >90  >90 mL/min   Anion gap 16 (*) 5 - 15  LIPASE, BLOOD     Status: None   Collection Time    08/15/14 11:30 PM      Result Value Ref Range   Lipase 27  11 - 59 U/L  URINALYSIS, ROUTINE W REFLEX MICROSCOPIC     Status: None   Collection Time    08/16/14 12:01 AM      Result Value Ref Range   Color, Urine YELLOW  YELLOW   APPearance CLEAR  CLEAR   Specific Gravity, Urine 1.025  1.005 - 1.030   pH 5.5  5.0 - 8.0   Glucose, UA NEGATIVE  NEGATIVE mg/dL   Hgb urine dipstick NEGATIVE  NEGATIVE   Bilirubin Urine NEGATIVE  NEGATIVE   Ketones, ur NEGATIVE  NEGATIVE mg/dL   Protein, ur NEGATIVE  NEGATIVE mg/dL   Urobilinogen, UA 0.2  0.0 - 1.0 mg/dL   Nitrite NEGATIVE  NEGATIVE   Leukocytes, UA NEGATIVE  NEGATIVE   12:27 AM The patient was advised of her normal lab findings. There is no evidence of acute pancreatitis or biliary obstruction. She was advised that this could represent a very early pancreatitis. A CT scan at this time would be unlikely to be diagnostic, and we wish to avoid CT scans and this patient has had multiple CT scans in the past. We will treat her with a short course of antiemetics and analgesics and she will [facility] today. She was advised that should her symptoms continue to worsen to be reevaluated.  I personally performed the services  described in this documentation, which was scribed in my presence. The recorded information has been reviewed and is accurate.  Kaitlyn SeamenJohn L Jackeline Gutknecht, MD 08/16/14 [phone]

## 2014-08-15 NOTE — ED Notes (Signed)
Pt presents to ED with c/o of LUQ pain and radiates to her back at times. Pt reports nausea and took zofran at home with out relief.

## 2014-08-16 LAB — COMPREHENSIVE METABOLIC PANEL
ALK PHOS: 95 U/L (ref 39–117)
ALT: 18 U/L (ref 0–35)
ANION GAP: 16 — AB (ref 5–15)
AST: 16 U/L (ref 0–37)
Albumin: 3.5 g/dL (ref 3.5–5.2)
BUN: 17 mg/dL (ref 6–23)
CALCIUM: 9.5 mg/dL (ref 8.4–10.5)
CO2: 22 mEq/L (ref 19–32)
CREATININE: 0.9 mg/dL (ref 0.50–1.10)
Chloride: 100 mEq/L (ref 96–112)
GFR calc non Af Amer: 85 mL/min — ABNORMAL LOW (ref >90)
GLUCOSE: 98 mg/dL (ref 70–99)
POTASSIUM: 3.8 meq/L (ref 3.7–5.3)
Sodium: 138 mEq/L (ref 137–147)
TOTAL PROTEIN: 6.8 g/dL (ref 6.0–8.3)
Total Bilirubin: <0.2 mg/dL — ABNORMAL LOW (ref 0.3–1.2)

## 2014-08-16 LAB — URINALYSIS, ROUTINE W REFLEX MICROSCOPIC
BILIRUBIN URINE: NEGATIVE
Glucose, UA: NEGATIVE mg/dL
HGB URINE DIPSTICK: NEGATIVE
Ketones, ur: NEGATIVE mg/dL
Leukocytes, UA: NEGATIVE
Nitrite: NEGATIVE
Protein, ur: NEGATIVE mg/dL
Specific Gravity, Urine: 1.025 (ref 1.005–1.030)
UROBILINOGEN UA: 0.2 mg/dL (ref 0.0–1.0)
pH: 5.5 (ref 5.0–8.0)

## 2014-08-16 LAB — LIPASE, BLOOD: LIPASE: 27 U/L (ref 11–59)

## 2014-08-16 MED ORDER — PROMETHAZINE HCL 25 MG/ML IJ SOLN
12.5000 mg | Freq: Once | INTRAMUSCULAR | Status: AC
  Administered 2014-08-16: 12.5 mg via INTRAVENOUS
  Filled 2014-08-16: qty 1

## 2014-08-16 MED ORDER — HYDROMORPHONE HCL 2 MG PO TABS: 2.0000 mg | ORAL_TABLET | ORAL | Status: DC | PRN

## 2014-08-16 MED ORDER — HYDROMORPHONE HCL 1 MG/ML IJ SOLN
1.0000 mg | Freq: Once | INTRAMUSCULAR | Status: AC
  Administered 2014-08-16: 1 mg via INTRAVENOUS
  Filled 2014-08-16: qty 1

## 2014-08-16 MED ORDER — PROMETHAZINE HCL 25 MG PO TABS: 25.0000 mg | ORAL_TABLET | Freq: Four times a day (QID) | ORAL | Status: DC | PRN

## 2014-08-16 NOTE — ED Notes (Signed)
Pt feeling nauseated after pain meds given. Will update MD

## 2014-08-16 NOTE — Discharge Instructions (Signed)

## 2014-11-17 ENCOUNTER — Encounter (HOSPITAL_BASED_OUTPATIENT_CLINIC_OR_DEPARTMENT_OTHER): Payer: Self-pay | Admitting: *Deleted

## 2014-11-17 ENCOUNTER — Emergency Department (HOSPITAL_BASED_OUTPATIENT_CLINIC_OR_DEPARTMENT_OTHER)
Admission: EM | Admit: 2014-11-17 | Discharge: 2014-11-17 | Disposition: A | Discharge status: Home / Self Care | Payer: BLUE CROSS/BLUE SHIELD | Attending: Emergency Medicine | Admitting: Emergency Medicine

## 2014-11-17 ENCOUNTER — Emergency Department (HOSPITAL_BASED_OUTPATIENT_CLINIC_OR_DEPARTMENT_OTHER): Payer: BLUE CROSS/BLUE SHIELD

## 2014-11-17 DIAGNOSIS — J45909 Unspecified asthma, uncomplicated: Secondary | ICD-10-CM | POA: Insufficient documentation

## 2014-11-17 DIAGNOSIS — F329 Major depressive disorder, single episode, unspecified: Secondary | ICD-10-CM | POA: Diagnosis not present

## 2014-11-17 DIAGNOSIS — Z8719 Personal history of other diseases of the digestive system: Secondary | ICD-10-CM | POA: Insufficient documentation

## 2014-11-17 DIAGNOSIS — R112 Nausea with vomiting, unspecified: Secondary | ICD-10-CM | POA: Insufficient documentation

## 2014-11-17 DIAGNOSIS — R1011 Right upper quadrant pain: Secondary | ICD-10-CM | POA: Diagnosis not present

## 2014-11-17 DIAGNOSIS — F419 Anxiety disorder, unspecified: Secondary | ICD-10-CM | POA: Insufficient documentation

## 2014-11-17 DIAGNOSIS — Z8679 Personal history of other diseases of the circulatory system: Secondary | ICD-10-CM | POA: Diagnosis not present

## 2014-11-17 DIAGNOSIS — Z862 Personal history of diseases of the blood and blood-forming organs and certain disorders involving the immune mechanism: Secondary | ICD-10-CM | POA: Diagnosis not present

## 2014-11-17 DIAGNOSIS — Z79899 Other long term (current) drug therapy: Secondary | ICD-10-CM | POA: Insufficient documentation

## 2014-11-17 DIAGNOSIS — R109 Unspecified abdominal pain: Secondary | ICD-10-CM | POA: Diagnosis present

## 2014-11-17 DIAGNOSIS — Z7952 Long term (current) use of systemic steroids: Secondary | ICD-10-CM | POA: Insufficient documentation

## 2014-11-17 LAB — CBC WITH DIFFERENTIAL/PLATELET
BASOS ABS: 0 10*3/uL (ref 0.0–0.1)
Basophils Relative: 0 % (ref 0–1)
Eosinophils Absolute: 0 10*3/uL (ref 0.0–0.7)
Eosinophils Relative: 0 % (ref 0–5)
HCT: 40.8 % (ref 36.0–46.0)
HEMOGLOBIN: 13.8 g/dL (ref 12.0–15.0)
Lymphocytes Relative: 15 % (ref 12–46)
Lymphs Abs: 1.5 10*3/uL (ref 0.7–4.0)
MCH: 31.6 pg (ref 26.0–34.0)
MCHC: 33.8 g/dL (ref 30.0–36.0)
MCV: 93.4 fL (ref 78.0–100.0)
MONOS PCT: 5 % (ref 3–12)
Monocytes Absolute: 0.5 10*3/uL (ref 0.1–1.0)
NEUTROS ABS: 8.3 10*3/uL — AB (ref 1.7–7.7)
NEUTROS PCT: 80 % — AB (ref 43–77)
Platelets: 275 10*3/uL (ref 150–400)
RBC: 4.37 MIL/uL (ref 3.87–5.11)
RDW: 12.2 % (ref 11.5–15.5)
WBC: 10.4 10*3/uL (ref 4.0–10.5)

## 2014-11-17 LAB — COMPREHENSIVE METABOLIC PANEL
ALK PHOS: 92 U/L (ref 39–117)
ALT: 52 U/L — ABNORMAL HIGH (ref 0–35)
AST: 38 U/L — AB (ref 0–37)
Albumin: 5.3 g/dL — ABNORMAL HIGH (ref 3.5–5.2)
Anion gap: 9 (ref 5–15)
BUN: 15 mg/dL (ref 6–23)
CO2: 21 mmol/L (ref 19–32)
Calcium: 9.4 mg/dL (ref 8.4–10.5)
Chloride: 105 mEq/L (ref 96–112)
Creatinine, Ser: 0.71 mg/dL (ref 0.50–1.10)
GFR calc Af Amer: >90 mL/min (ref >90)
GFR calc non Af Amer: >90 mL/min (ref >90)
GLUCOSE: 101 mg/dL — AB (ref 70–99)
POTASSIUM: 3.9 mmol/L (ref 3.5–5.1)
Sodium: 135 mmol/L (ref 135–145)
Total Bilirubin: 0.5 mg/dL (ref 0.3–1.2)
Total Protein: 8.4 g/dL — ABNORMAL HIGH (ref 6.0–8.3)

## 2014-11-17 LAB — URINALYSIS, ROUTINE W REFLEX MICROSCOPIC
Bilirubin Urine: NEGATIVE
GLUCOSE, UA: NEGATIVE mg/dL
HGB URINE DIPSTICK: NEGATIVE
Ketones, ur: NEGATIVE mg/dL
Leukocytes, UA: NEGATIVE
Nitrite: NEGATIVE
PH: 5.5 (ref 5.0–8.0)
Protein, ur: NEGATIVE mg/dL
Specific Gravity, Urine: 1.007 (ref 1.005–1.030)
Urobilinogen, UA: 0.2 mg/dL (ref 0.0–1.0)

## 2014-11-17 LAB — LIPASE, BLOOD: LIPASE: 27 U/L (ref 11–59)

## 2014-11-17 MED ORDER — HYDROMORPHONE HCL 2 MG PO TABS: 2.0000 mg | ORAL_TABLET | ORAL | Status: DC | PRN

## 2014-11-17 MED ORDER — PROMETHAZINE HCL 25 MG PO TABS: 25.0000 mg | ORAL_TABLET | Freq: Four times a day (QID) | ORAL | Status: DC | PRN

## 2014-11-17 MED ORDER — ONDANSETRON HCL 4 MG/2ML IJ SOLN
4.0000 mg | Freq: Once | INTRAMUSCULAR | Status: AC
  Administered 2014-11-17: 4 mg via INTRAVENOUS
  Filled 2014-11-17: qty 2

## 2014-11-17 MED ORDER — FENTANYL CITRATE 0.05 MG/ML IJ SOLN
50.0000 ug | Freq: Once | INTRAMUSCULAR | Status: AC
  Administered 2014-11-17: 50 ug via INTRAVENOUS
  Filled 2014-11-17: qty 2

## 2014-11-17 MED ORDER — ONDANSETRON 4 MG PO TBDP: 4.0000 mg | ORAL_TABLET | Freq: Three times a day (TID) | ORAL | Status: DC | PRN

## 2014-11-17 MED ORDER — SODIUM CHLORIDE 0.9 % IV BOLUS (SEPSIS)
1000.0000 mL | Freq: Once | INTRAVENOUS | Status: AC
  Administered 2014-11-17: 1000 mL via INTRAVENOUS

## 2014-11-17 MED ORDER — KETOROLAC TROMETHAMINE 30 MG/ML IJ SOLN
30.0000 mg | Freq: Once | INTRAMUSCULAR | Status: DC
  Filled 2014-11-17: qty 1

## 2014-11-17 MED ORDER — HYDROCORTISONE NA SUCCINATE PF 100 MG IJ SOLR: 100.0000 mg | Freq: Once | INTRAMUSCULAR | Status: DC

## 2014-11-17 MED ORDER — HYDROMORPHONE HCL 1 MG/ML IJ SOLN
1.0000 mg | Freq: Once | INTRAMUSCULAR | Status: AC
  Administered 2014-11-17: 1 mg via INTRAVENOUS
  Filled 2014-11-17: qty 1

## 2014-11-17 NOTE — ED Provider Notes (Signed)
CSN: [id number]     Arrival date & time 11/17/14  1612 History   First MD Initiated Contact with Patient 11/17/14 1627     Chief Complaint  Patient presents with  . Abdominal Pain     (Consider location/radiation/quality/duration/timing/severity/associated sxs/prior Treatment) HPI    PCP: FULP, CAMMIE, MD Blood pressure 137/94, pulse 104, temperature 98.6 F (37 C), temperature source Oral, resp. rate 16, height 5\' 9"  (1.753 m), weight 220 lb (99.791 kg), SpO2 100 %.  Kaitlyn Johnson is a 34 y.o.female with a significant PMH of Von Willebrand dz, Adrenal Insufficiency, anemia, POTS, asthma, anxiety, anxiety, depression, h/o hiatal hernia, sphincter of Oddi dysfunction presents to the ER with complaints of abdominal pain that started 1 hour prior to arrival. She no longer has a gallbladder and has hx of needing her oddi sphincter released as it becomes strictured. She feels that this is consistent with her previous pancreatitis or Oddi dysfunction. She has had vomiting and now is only dry heaving. Denies fevers, diarrhea, coughing, alcohol use, NSAIDs, injury.   Past Medical History  Diagnosis Date  . Von Willebrand disease     bruising easy  . Anemia 11/26/2011  . POTS (postural orthostatic tachycardia syndrome)   . Asthma   . Anxiety   . Depression   . H/O hiatal hernia   . Celiac and mesenteric artery injury   . Sphincter of Oddi dysfunction    Past Surgical History  Procedure Laterality Date  . Abdominal hysterectomy    . Laparoscopic endometriosis fulguration    . Roux-en-y procedure    . Cholecystectomy    . Anterior cervical decomp/discectomy fusion N/A 03/31/2013    Procedure: ANTERIOR CERVICAL DECOMPRESSION/DISCECTOMY FUSION 1 LEVEL Cervical five-six;  Surgeon: Reinaldo Meeker, MD;  Location: [facility];  Service: Neurosurgery;  Laterality: N/A;   Family History  Problem Relation Age of Onset  . Hypertension Mother    History  Substance Use Topics  .  Smoking status: Never Smoker   . Smokeless tobacco: Never Used  . Alcohol Use: 3.0 oz/week    5 Glasses of wine per week   OB History    No data available     Review of Systems  10 Systems reviewed and are negative for acute change except as noted in the HPI.     Allergies  Shellfish allergy; Reglan; Wheat bran; Sulfa antibiotics; and Tartrazine  Home Medications   Prior to Admission medications   Medication Sig Start Date End Date Taking? Authorizing Provider  clonazePAM (KLONOPIN) 1 MG tablet Take 1 tablet (1 mg total) by mouth 2 (two) times daily as needed for anxiety. 12/07/13   Dorothea Ogle, MD  diltiazem (DILACOR XR) 180 MG 24 hr capsule Take 180 mg by mouth every morning.     Historical Provider, MD  DULoxetine (CYMBALTA) 60 MG capsule Take 60 mg by mouth daily.    Historical Provider, MD  EPINEPHrine (EPIPEN) 0.3 mg/0.3 mL SOAJ injection Inject 0.3 mg into the muscle once as needed (severe allergic reaction).    Historical Provider, MD  hydrocortisone (CORTEF) 5 MG tablet Take 5-10 mg by mouth 2 (two) times daily. Take 10mg  in the morning and 5mg  in the afternoon    Historical Provider, MD  hydrocortisone sodium succinate (SOLU-CORTEF) 100 mg/2 mL injection Inject 100 mg in the muscle over 30 seconds if you cannot take hydrocortisone by mouth 10/01/13   Carlus Pavlov, MD  HYDROmorphone (DILAUDID) 2 MG tablet Take 1 tablet (  2 mg total) by mouth every 4 (four) hours as needed for severe pain (for pain). 11/17/14   Rjay Revolorio Irine Seal, PA-C  lipase/protease/amylase (CREON-12/PANCREASE) 12000 UNITS CPEP capsule Take 2 capsules by mouth 3 (three) times daily before meals. 12/07/13   Dorothea Ogle, MD  Lurasidone HCl (LATUDA) 20 MG TABS Take 20 mg by mouth every evening.     Historical Provider, MD  metoprolol (LOPRESSOR) 50 MG tablet Take 75 mg by mouth 2 (two) times daily.    Historical Provider, MD  mirtazapine (REMERON) 30 MG tablet Take 30 mg by mouth at bedtime.    Historical  Provider, MD  montelukast (SINGULAIR) 10 MG tablet Take 10 mg by mouth at bedtime.    Historical Provider, MD  ondansetron (ZOFRAN ODT) 4 MG disintegrating tablet Take 1 tablet (4 mg total) by mouth every 8 (eight) hours as needed for nausea or vomiting. 11/17/14   Dorthula Matas, PA-C  ondansetron (ZOFRAN-ODT) 4 MG disintegrating tablet Take 1-2 tablets (4-8 mg total) by mouth every 8 (eight) hours as needed for nausea or vomiting. 12/07/13   Dorothea Ogle, MD  oxyCODONE-acetaminophen (PERCOCET/ROXICET) 5-325 MG per tablet Take 1-2 tablets by mouth every 4 (four) hours as needed for severe pain. 12/28/13   Jillyn Ledger, PA-C  oxyCODONE-acetaminophen (PERCOCET/ROXICET) 5-325 MG per tablet Take 1 tablet by mouth every 6 (six) hours as needed for severe pain. 01/25/14   Derwood Kaplan, MD  promethazine (PHENERGAN) 25 MG suppository Place 1 suppository (25 mg total) rectally every 6 (six) hours as needed for nausea. 01/25/14   Derwood Kaplan, MD  promethazine (PHENERGAN) 25 MG tablet Take 1 tablet (25 mg total) by mouth every 6 (six) hours as needed for nausea or vomiting. 08/16/14   Carlisle Beers Molpus, MD  promethazine (PHENERGAN) 25 MG tablet Take 1 tablet (25 mg total) by mouth every 6 (six) hours as needed for nausea or vomiting. 11/17/14   Dorthula Matas, PA-C  zolpidem (AMBIEN) 10 MG tablet Take 1 tablet (10 mg total) by mouth at bedtime. 12/07/13   Dorothea Ogle, MD   BP 127/74 mmHg  Pulse 92  Temp(Src) 98.6 F (37 C) (Oral)  Resp 16  Ht  (1.753 m)  Wt 220 lb (99.791 kg)  BMI 32.47 kg/m2  SpO2 97% Physical Exam  Constitutional: She appears well-developed and well-nourished. No distress.  HENT:  Head: Normocephalic and atraumatic.  Eyes: Pupils are equal, round, and reactive to light.  Neck: Normal range of motion. Neck supple.  Cardiovascular: Normal rate and regular rhythm.   Pulmonary/Chest: Effort normal.  Abdominal: Soft. Bowel sounds are normal. She exhibits no distension and no  fluid wave. There is tenderness in the right upper quadrant and epigastric area. There is guarding. There is no rigidity, no rebound and no CVA tenderness. No hernia.  Neurological: She is alert.  Skin: Skin is warm and dry.  Nursing note and vitals reviewed.   ED Course  Procedures (including critical care time) Labs Review Labs Reviewed  CBC WITH DIFFERENTIAL - Abnormal; Notable for the following:    Neutrophils Relative % 80 (*)    Neutro Abs 8.3 (*)    All other components within normal limits  COMPREHENSIVE METABOLIC PANEL - Abnormal; Notable for the following:    Glucose, Bld 101 (*)    Total Protein 8.4 (*)    Albumin 5.3 (*)    AST 38 (*)    ALT 52 (*)    All  other components within normal limits  URINALYSIS, ROUTINE W REFLEX MICROSCOPIC  LIPASE, BLOOD    Imaging Review US Abdomen Complete  11/17/2014   CLINICAL DATA:  One day history of right upper quadrant pain. History of pancreatitis  EXAM: ULTRASOUND ABDOMEN COMPLETE  COMPARISON:  CT abdomen and pelvis January 25, 2014  FINDINGS: Gallbladder: Surgically absent.  Common bile duct: Diameter: 8 mm distally. No mass or calculus seen in the biliary ductal system.  Liver: No focal lesion identified. Liver echogenicity is diffusely increased.  IVC: No abnormality visualized.  Pancreas: Portions of the pancreas are obscured by gas. Visualized portions of the pancreas shows somewhat inhomogeneous echotexture. No well-defined mass or pancreatic duct dilatation seen.  Spleen: Size and appearance within normal limits.  Right Kidney: Length: 11.6 cm. Echogenicity within normal limits. No mass or hydronephrosis visualized.  Left Kidney: Length: 12.4 cm. Echogenicity within normal limits. No mass or hydronephrosis visualized.  Abdominal aorta: No aneurysm visualized.  Other findings: No demonstrable ascites.  IMPRESSION: Gallbladder absent. Mild prominence of the distal common bile duct is probably due to post cholecystectomy state.  Portions  of the pancreas are obscured by gas. Visualized portions of pancreas appear rather inhomogeneous. Question of a degree of pancreatitis given the history and appearance of pancreas on prior CT examination. A well-defined mass is not seen in the visualized portions of the pancreas.  Diffuse increased echogenicity in the liver, most likely due to hepatic steatosis. While no focal liver lesions are identified, it must be cautioned that the sensitivity of ultrasound for focal liver lesions is diminished in this circumstance.   Electronically Signed   By: Bretta Bang M.D.   On: 11/17/2014 20:01     EKG Interpretation None      MDM   Final diagnoses:  RUQ abdominal pain  Non-intractable vomiting with nausea, vomiting of unspecified type    Medications  HYDROmorphone (DILAUDID) injection 1 mg (not administered)  sodium chloride 0.9 % bolus 1,000 mL (0 mLs Intravenous Stopped 11/17/14 1826)  ondansetron (ZOFRAN) injection 4 mg (4 mg Intravenous Given 11/17/14 1745)  HYDROmorphone (DILAUDID) injection 1 mg (1 mg Intravenous Given 11/17/14 1746)  HYDROmorphone (DILAUDID) injection 1 mg (1 mg Intravenous Given 11/17/14 1827)  HYDROmorphone (DILAUDID) injection 1 mg (1 mg Intravenous Given 11/17/14 1853)  sodium chloride 0.9 % bolus 1,000 mL (1,000 mLs Intravenous New Bag/Given 11/17/14 2009)  fentaNYL (SUBLIMAZE) injection 50 mcg (50 mcg Intravenous Given 11/17/14 2024)  ondansetron (ZOFRAN) injection 4 mg (4 mg Intravenous Given 11/17/14 2033)    The patients labs are not acutely abnormal and her [place] does not show any significant findings, possibly early pancreatitis- Normal bilirubin. She is able to tolerate PO and her pain was managed here in the ED after 4 rounds of pain medications. Dr. Lynelle Doctor, Elnora Morrison her as well. She is no longer being treated for adrenal sufficiency and has not been on steroid treatment for over a year. We believe the patients labs and imaging are reassuring and that she can go  home with pain and nausea control.  She reports the only thing she takes for pain at home is Dilaudid and that she is out of her Zofran, Phenergan and Dilaudid tabs.  I will refer this for her. She has been instructed to call her GI doctor as well as her PCP and GI doctor at [facility] tomorrow.  33 y.o.Kaitlyn Johnson's evaluation in the Emergency Department is complete. It has been determined that no acute conditions  requiring further emergency intervention are present at this time. The patient/guardian have been advised of the diagnosis and plan. We have discussed signs and symptoms that warrant return to the ED, such as changes or worsening in symptoms.  Vital signs are stable at discharge. Filed Vitals:   11/17/14 2030  BP: 127/74  Pulse: 92  Temp:   Resp: 16    Patient/guardian has voiced understanding and agreed to follow-up with the PCP or specialist.   Dorthula Matasiffany G Zainab Crumrine, PA-C 11/17/14 2128  Linwood DibblesJon Knapp, MD 11/17/14 2136

## 2014-11-17 NOTE — ED Notes (Signed)
Pt c/o right upper abd pain with n/v  X 1 hr

## 2014-11-17 NOTE — Discharge Instructions (Signed)

## 2014-11-20 ENCOUNTER — Encounter (HOSPITAL_COMMUNITY): Payer: Self-pay | Admitting: Emergency Medicine

## 2014-11-20 ENCOUNTER — Emergency Department (HOSPITAL_COMMUNITY)
Admission: EM | Admit: 2014-11-20 | Discharge: 2014-11-20 | Disposition: A | Discharge status: Home / Self Care | Payer: BLUE CROSS/BLUE SHIELD | Attending: Emergency Medicine | Admitting: Emergency Medicine

## 2014-11-20 DIAGNOSIS — Z9884 Bariatric surgery status: Secondary | ICD-10-CM | POA: Insufficient documentation

## 2014-11-20 DIAGNOSIS — Z862 Personal history of diseases of the blood and blood-forming organs and certain disorders involving the immune mechanism: Secondary | ICD-10-CM | POA: Diagnosis not present

## 2014-11-20 DIAGNOSIS — F329 Major depressive disorder, single episode, unspecified: Secondary | ICD-10-CM | POA: Diagnosis not present

## 2014-11-20 DIAGNOSIS — Z87828 Personal history of other (healed) physical injury and trauma: Secondary | ICD-10-CM | POA: Insufficient documentation

## 2014-11-20 DIAGNOSIS — Z9089 Acquired absence of other organs: Secondary | ICD-10-CM | POA: Insufficient documentation

## 2014-11-20 DIAGNOSIS — R1011 Right upper quadrant pain: Secondary | ICD-10-CM | POA: Diagnosis present

## 2014-11-20 DIAGNOSIS — Z90710 Acquired absence of both cervix and uterus: Secondary | ICD-10-CM | POA: Diagnosis not present

## 2014-11-20 DIAGNOSIS — J45909 Unspecified asthma, uncomplicated: Secondary | ICD-10-CM | POA: Insufficient documentation

## 2014-11-20 DIAGNOSIS — R1010 Upper abdominal pain, unspecified: Secondary | ICD-10-CM | POA: Diagnosis not present

## 2014-11-20 DIAGNOSIS — Z79899 Other long term (current) drug therapy: Secondary | ICD-10-CM | POA: Insufficient documentation

## 2014-11-20 DIAGNOSIS — F419 Anxiety disorder, unspecified: Secondary | ICD-10-CM | POA: Insufficient documentation

## 2014-11-20 DIAGNOSIS — Z8719 Personal history of other diseases of the digestive system: Secondary | ICD-10-CM | POA: Diagnosis not present

## 2014-11-20 DIAGNOSIS — Z7952 Long term (current) use of systemic steroids: Secondary | ICD-10-CM | POA: Diagnosis not present

## 2014-11-20 DIAGNOSIS — R1013 Epigastric pain: Secondary | ICD-10-CM | POA: Diagnosis not present

## 2014-11-20 LAB — CBC WITH DIFFERENTIAL/PLATELET
Basophils Absolute: 0 10*3/uL (ref 0.0–0.1)
Basophils Relative: 0 % (ref 0–1)
Eosinophils Absolute: 0.1 10*3/uL (ref 0.0–0.7)
Eosinophils Relative: 1 % (ref 0–5)
HCT: 37.2 % (ref 36.0–46.0)
Hemoglobin: 12.6 g/dL (ref 12.0–15.0)
Lymphocytes Relative: 34 % (ref 12–46)
Lymphs Abs: 2.7 10*3/uL (ref 0.7–4.0)
MCH: 32.1 pg (ref 26.0–34.0)
MCHC: 33.9 g/dL (ref 30.0–36.0)
MCV: 94.9 fL (ref 78.0–100.0)
Monocytes Absolute: 0.5 10*3/uL (ref 0.1–1.0)
Monocytes Relative: 6 % (ref 3–12)
Neutro Abs: 4.7 10*3/uL (ref 1.7–7.7)
Neutrophils Relative %: 59 % (ref 43–77)
Platelets: 287 10*3/uL (ref 150–400)
RBC: 3.92 MIL/uL (ref 3.87–5.11)
RDW: 12.6 % (ref 11.5–15.5)
WBC: 7.9 10*3/uL (ref 4.0–10.5)

## 2014-11-20 LAB — COMPREHENSIVE METABOLIC PANEL
ALT: 49 U/L — ABNORMAL HIGH (ref 0–35)
AST: 35 U/L (ref 0–37)
Albumin: 4 g/dL (ref 3.5–5.2)
Alkaline Phosphatase: 76 U/L (ref 39–117)
Anion gap: 8 (ref 5–15)
BUN: 12 mg/dL (ref 6–23)
CO2: 23 mmol/L (ref 19–32)
Calcium: 9.1 mg/dL (ref 8.4–10.5)
Chloride: 104 mmol/L (ref 96–112)
Creatinine, Ser: 0.68 mg/dL (ref 0.50–1.10)
GFR calc Af Amer: >90 mL/min (ref >90)
GFR calc non Af Amer: >90 mL/min (ref >90)
Glucose, Bld: 96 mg/dL (ref 70–99)
Potassium: 3.8 mmol/L (ref 3.5–5.1)
Sodium: 135 mmol/L (ref 135–145)
Total Bilirubin: 0.2 mg/dL — ABNORMAL LOW (ref 0.3–1.2)
Total Protein: 7.2 g/dL (ref 6.0–8.3)

## 2014-11-20 LAB — URINALYSIS, ROUTINE W REFLEX MICROSCOPIC
Bilirubin Urine: NEGATIVE
Glucose, UA: NEGATIVE mg/dL
Hgb urine dipstick: NEGATIVE
Ketones, ur: NEGATIVE mg/dL
Leukocytes, UA: NEGATIVE
Nitrite: NEGATIVE
Protein, ur: NEGATIVE mg/dL
Specific Gravity, Urine: 1.01 (ref 1.005–1.030)
Urobilinogen, UA: 0.2 mg/dL (ref 0.0–1.0)
pH: 5.5 (ref 5.0–8.0)

## 2014-11-20 LAB — LIPASE, BLOOD: Lipase: 30 U/L (ref 11–59)

## 2014-11-20 MED ORDER — HYDROMORPHONE HCL 2 MG/ML IJ SOLN
1.5000 mg | Freq: Once | INTRAMUSCULAR | Status: AC
  Administered 2014-11-20: 1.5 mg via INTRAVENOUS
  Filled 2014-11-20: qty 1

## 2014-11-20 MED ORDER — HYDROMORPHONE HCL 2 MG PO TABS: 2.0000 mg | ORAL_TABLET | ORAL | Status: DC | PRN

## 2014-11-20 MED ORDER — SODIUM CHLORIDE 0.9 % IV BOLUS (SEPSIS)
1000.0000 mL | Freq: Once | INTRAVENOUS | Status: AC
  Administered 2014-11-20: 1000 mL via INTRAVENOUS

## 2014-11-20 MED ORDER — ONDANSETRON HCL 4 MG/2ML IJ SOLN
4.0000 mg | Freq: Once | INTRAMUSCULAR | Status: AC
  Administered 2014-11-20: 4 mg via INTRAVENOUS
  Filled 2014-11-20: qty 2

## 2014-11-20 NOTE — Discharge Instructions (Signed)
Abdominal Pain, Women °Abdominal (stomach, pelvic, or belly) pain can be caused by many things. It is important to tell your doctor: °· The location of the pain. °· Does it come and go or is it present all the time? °· Are there things that start the pain (eating certain foods, exercise)? °· Are there other symptoms associated with the pain (fever, nausea, vomiting, diarrhea)? °All of this is helpful to know when trying to find the cause of the pain. °CAUSES  °· Stomach: virus or bacteria infection, or ulcer. °· Intestine: appendicitis (inflamed appendix), regional ileitis (Crohn's disease), ulcerative colitis (inflamed colon), irritable bowel syndrome, diverticulitis (inflamed diverticulum of the colon), or cancer of the stomach or intestine. °· Gallbladder disease or stones in the gallbladder. °· Kidney disease, kidney stones, or infection. °· Pancreas infection or cancer. °· Fibromyalgia (pain disorder). °· Diseases of the female organs: °¨ Uterus: fibroid (non-cancerous) tumors or infection. °¨ Fallopian tubes: infection or tubal pregnancy. °¨ Ovary: cysts or tumors. °¨ Pelvic adhesions (scar tissue). °¨ Endometriosis (uterus lining tissue growing in the pelvis and on the pelvic organs). °¨ Pelvic congestion syndrome (female organs filling up with blood just before the menstrual period). °¨ Pain with the menstrual period. °¨ Pain with ovulation (producing an egg). °¨ Pain with an IUD (intrauterine device, birth control) in the uterus. °¨ Cancer of the female organs. °· Functional pain (pain not caused by a disease, may improve without treatment). °· Psychological pain. °· Depression. °DIAGNOSIS  °Your doctor will decide the seriousness of your pain by doing an examination. °· Blood tests. °· X-rays. °· Ultrasound. °· CT scan (computed tomography, special type of X-ray). °· MRI (magnetic resonance imaging). °· Cultures, for infection. °· Barium enema (dye inserted in the large intestine, to better view it with  X-rays). °· Colonoscopy (looking in intestine with a lighted tube). °· Laparoscopy (minor surgery, looking in abdomen with a lighted tube). °· Major abdominal exploratory surgery (looking in abdomen with a large incision). °TREATMENT  °The treatment will depend on the cause of the pain.  °· Many cases can be observed and treated at home. °· Over-the-counter medicines recommended by your caregiver. °· Prescription medicine. °· Antibiotics, for infection. °· Birth control pills, for painful periods or for ovulation pain. °· Hormone treatment, for endometriosis. °· Nerve blocking injections. °· Physical therapy. °· Antidepressants. °· Counseling with a psychologist or psychiatrist. °· Minor or major surgery. °HOME CARE INSTRUCTIONS  °· Do not take laxatives, unless directed by your caregiver. °· Take over-the-counter pain medicine only if ordered by your caregiver. Do not take aspirin because it can cause an upset stomach or bleeding. °· Try a clear liquid diet (broth or water) as ordered by your caregiver. Slowly move to a bland diet, as tolerated, if the pain is related to the stomach or intestine. °· Have a thermometer and take your temperature several times a day, and record it. °· Bed rest and sleep, if it helps the pain. °· Avoid sexual intercourse, if it causes pain. °· Avoid stressful situations. °· Keep your follow-up appointments and tests, as your caregiver orders. °· If the pain does not go away with medicine or surgery, you may try: °¨ Acupuncture. °¨ Relaxation exercises (yoga, meditation). °¨ Group therapy. °¨ Counseling. °SEEK MEDICAL CARE IF:  °· You notice certain foods cause stomach pain. °· Your home care treatment is not helping your pain. °· You need stronger pain medicine. °· You want your IUD removed. °· You feel faint or   lightheaded. °· You develop nausea and vomiting. °· You develop a rash. °· You are having side effects or an allergy to your medicine. °SEEK IMMEDIATE MEDICAL CARE IF:  °· Your  pain does not go away or gets worse. °· You have a fever. °· Your pain is felt only in portions of the abdomen. The right side could possibly be appendicitis. The left lower portion of the abdomen could be colitis or diverticulitis. °· You are passing blood in your stools (bright red or black tarry stools, with or without vomiting). °· You have blood in your urine. °· You develop chills, with or without a fever. °· You pass out. °MAKE SURE YOU:  °· Understand these instructions. °· Will watch your condition. °· Will get help right away if you are not doing well or get worse. °Document Released: 08/12/2007 Document Revised: 03/01/2014 Document Reviewed: 09/01/2009 °[employer]® Patient Information ©2015 [employer]. This information is not intended to replace advice given to you by your health care provider. Make sure you discuss any questions you have with your health care provider. ° °

## 2014-11-20 NOTE — ED Notes (Addendum)
Pt c/o RUQ pain since Wednesday.  States that she was seen at [facility] on Wednesday for same and was told that her liver enzymes were elevated and that they were concerned that she may be developing pancreatitis.  Pt has hx of liver problems and goes to [facility] for stents.  No vomiting or diarrhea.

## 2014-11-20 NOTE — ED Provider Notes (Signed)
CSN: [id number]     Arrival date & time 11/20/14  1603 History   First MD Initiated Contact with Patient 11/20/14 1619     Chief Complaint  Patient presents with  . Abdominal Pain     (Consider location/radiation/quality/duration/timing/severity/associated sxs/prior Treatment) HPI   34 year old female with abdominal pain. Epigastric/right upper quadrant. Patient was recently seen in other ER and noted to have minimally elevated LFTs. She is treated symptomatically and discharged. She continues to have symptoms despite she presented today. She is concerned that her LFTs may have worsened. Abdominal surgical history significant for cholecystectomy, hysterectomy and Roux-en-Y. She reports a past history of abdominal pain related to sphincter of Oddi dysfunction. She reports that she has GI follow-up this upcoming week at [facility]. Requesting pain medication until she can follow-up then.  Past Medical History  Diagnosis Date  . Von Willebrand disease     bruising easy  . Anemia 11/26/2011  . POTS (postural orthostatic tachycardia syndrome)   . Asthma   . Anxiety   . Depression   . H/O hiatal hernia   . Celiac and mesenteric artery injury   . Sphincter of Oddi dysfunction    Past Surgical History  Procedure Laterality Date  . Abdominal hysterectomy    . Laparoscopic endometriosis fulguration    . Roux-en-y procedure    . Cholecystectomy    . Anterior cervical decomp/discectomy fusion N/A 03/31/2013    Procedure: ANTERIOR CERVICAL DECOMPRESSION/DISCECTOMY FUSION 1 LEVEL Cervical five-six;  Surgeon: Faythe Ghee, MD;  Location: [facility];  Service: Neurosurgery;  Laterality: N/A;   Family History  Problem Relation Age of Onset  . Hypertension Mother    History  Substance Use Topics  . Smoking status: Never Smoker   . Smokeless tobacco: Never Used  . Alcohol Use: 3.0 oz/week    5 Glasses of wine per week   OB History    No data available     Review of  Systems    Allergies  Shellfish allergy; Reglan; Wheat bran; Sulfa antibiotics; and Tartrazine  Home Medications   Prior to Admission medications   Medication Sig Start Date End Date Taking? Authorizing Provider  clonazePAM (KLONOPIN) 1 MG tablet Take 1 tablet (1 mg total) by mouth 2 (two) times daily as needed for anxiety. 12/07/13   Theodis Blaze, MD  diltiazem (DILACOR XR) 180 MG 24 hr capsule Take 180 mg by mouth every morning.     Historical Provider, MD  DULoxetine (CYMBALTA) 60 MG capsule Take 60 mg by mouth daily.    Historical Provider, MD  EPINEPHrine (EPIPEN) 0.3 mg/0.3 mL SOAJ injection Inject 0.3 mg into the muscle once as needed (severe allergic reaction).    Historical Provider, MD  hydrocortisone (CORTEF) 5 MG tablet Take 5-10 mg by mouth 2 (two) times daily. Take 60m in the morning and 58min the afternoon    Historical Provider, MD  hydrocortisone sodium succinate (SOLU-CORTEF) 100 mg/2 mL injection Inject 100 mg in the muscle over 30 seconds if you cannot take hydrocortisone by mouth 10/01/13   CrPhilemon KingdomMD  HYDROmorphone (DILAUDID) 2 MG tablet Take 1 tablet (2 mg total) by mouth every 4 (four) hours as needed for severe pain (for pain). 11/17/14   Tiffany G Marilu FavrePA-C  lipase/protease/amylase (CREON-12/PANCREASE) 12000 UNITS CPEP capsule Take 2 capsules by mouth 3 (three) times daily before meals. 12/07/13   IsTheodis BlazeMD  Lurasidone HCl (LATUDA) 20 MG TABS Take 20 mg by mouth  every evening.     Historical Provider, MD  metoprolol (LOPRESSOR) 50 MG tablet Take 75 mg by mouth 2 (two) times daily.    Historical Provider, MD  mirtazapine (REMERON) 30 MG tablet Take 30 mg by mouth at bedtime.    Historical Provider, MD  montelukast (SINGULAIR) 10 MG tablet Take 10 mg by mouth at bedtime.    Historical Provider, MD  ondansetron (ZOFRAN ODT) 4 MG disintegrating tablet Take 1 tablet (4 mg total) by mouth every 8 (eight) hours as needed for nausea or vomiting. 11/17/14    Linus Mako, PA-C  ondansetron (ZOFRAN-ODT) 4 MG disintegrating tablet Take 1-2 tablets (4-8 mg total) by mouth every 8 (eight) hours as needed for nausea or vomiting. 12/07/13   Theodis Blaze, MD  oxyCODONE-acetaminophen (PERCOCET/ROXICET) 5-325 MG per tablet Take 1-2 tablets by mouth every 4 (four) hours as needed for severe pain. 12/28/13   Lucila Maine, PA-C  oxyCODONE-acetaminophen (PERCOCET/ROXICET) 5-325 MG per tablet Take 1 tablet by mouth every 6 (six) hours as needed for severe pain. 01/25/14   Varney Biles, MD  promethazine (PHENERGAN) 25 MG suppository Place 1 suppository (25 mg total) rectally every 6 (six) hours as needed for nausea. 01/25/14   Varney Biles, MD  promethazine (PHENERGAN) 25 MG tablet Take 1 tablet (25 mg total) by mouth every 6 (six) hours as needed for nausea or vomiting. 08/16/14   Karen Chafe Molpus, MD  promethazine (PHENERGAN) 25 MG tablet Take 1 tablet (25 mg total) by mouth every 6 (six) hours as needed for nausea or vomiting. 11/17/14   Linus Mako, PA-C  zolpidem (AMBIEN) 10 MG tablet Take 1 tablet (10 mg total) by mouth at bedtime. 12/07/13   Theodis Blaze, MD   BP 132/84 mmHg  Pulse 94  Temp(Src) 98.9 F (37.2 C) (Oral)  Resp 18  SpO2 100% Physical Exam  Constitutional: She appears well-developed and well-nourished. No distress.  HENT:  Head: Normocephalic and atraumatic.  Eyes: Conjunctivae are normal. Right eye exhibits no discharge. Left eye exhibits no discharge.  Neck: Neck supple.  Cardiovascular: Normal rate, regular rhythm and normal heart sounds.  Exam reveals no gallop and no friction rub.   No murmur heard. Pulmonary/Chest: Effort normal and breath sounds normal. No respiratory distress.  Abdominal: Soft. She exhibits no distension. There is tenderness.  Tenderness in epigastrium without rebound or guarding. No distention.  Musculoskeletal: She exhibits no edema or tenderness.  Neurological: She is alert.  Skin: Skin is warm and dry.   Psychiatric: She has a normal mood and affect. Her behavior is normal. Thought content normal.  Nursing note and vitals reviewed.   ED Course  Procedures (including critical care time) Labs Review Labs Reviewed - No data to display  Imaging Review No results found.   EKG Interpretation None      MDM   Final diagnoses:  Pain of upper abdomen    34 year old female with upper abdominal pain. Abdominal exam is fairly benign. Her labs are improved from last evaluation. Requesting continued pain medicine to her follow-up appointment at [facility] this upcoming week. This was provided for her. Discussed with her that this is second time she has received scripts for narcotics in the ED and further pain management ideally should be by a single provider that can regularly follow her.   Virgel Manifold, MD 11/24/14 1339

## 2015-01-10 ENCOUNTER — Emergency Department (HOSPITAL_BASED_OUTPATIENT_CLINIC_OR_DEPARTMENT_OTHER)
Admission: EM | Admit: 2015-01-10 | Discharge: 2015-01-10 | Disposition: A | Discharge status: Home / Self Care | Payer: BLUE CROSS/BLUE SHIELD | Attending: Emergency Medicine | Admitting: Emergency Medicine

## 2015-01-10 ENCOUNTER — Encounter (HOSPITAL_BASED_OUTPATIENT_CLINIC_OR_DEPARTMENT_OTHER): Payer: Self-pay | Admitting: Emergency Medicine

## 2015-01-10 DIAGNOSIS — J45909 Unspecified asthma, uncomplicated: Secondary | ICD-10-CM | POA: Diagnosis not present

## 2015-01-10 DIAGNOSIS — Z9049 Acquired absence of other specified parts of digestive tract: Secondary | ICD-10-CM | POA: Insufficient documentation

## 2015-01-10 DIAGNOSIS — Z862 Personal history of diseases of the blood and blood-forming organs and certain disorders involving the immune mechanism: Secondary | ICD-10-CM | POA: Insufficient documentation

## 2015-01-10 DIAGNOSIS — Z90710 Acquired absence of both cervix and uterus: Secondary | ICD-10-CM | POA: Insufficient documentation

## 2015-01-10 DIAGNOSIS — F419 Anxiety disorder, unspecified: Secondary | ICD-10-CM | POA: Insufficient documentation

## 2015-01-10 DIAGNOSIS — R1012 Left upper quadrant pain: Secondary | ICD-10-CM | POA: Diagnosis not present

## 2015-01-10 DIAGNOSIS — F329 Major depressive disorder, single episode, unspecified: Secondary | ICD-10-CM | POA: Diagnosis not present

## 2015-01-10 DIAGNOSIS — Z8719 Personal history of other diseases of the digestive system: Secondary | ICD-10-CM | POA: Insufficient documentation

## 2015-01-10 DIAGNOSIS — Z79899 Other long term (current) drug therapy: Secondary | ICD-10-CM | POA: Insufficient documentation

## 2015-01-10 LAB — CBC WITH DIFFERENTIAL/PLATELET
Basophils Absolute: 0 10*3/uL (ref 0.0–0.1)
Basophils Relative: 0 % (ref 0–1)
EOS PCT: 2 % (ref 0–5)
Eosinophils Absolute: 0.1 10*3/uL (ref 0.0–0.7)
HCT: 34.1 % — ABNORMAL LOW (ref 36.0–46.0)
Hemoglobin: 11.5 g/dL — ABNORMAL LOW (ref 12.0–15.0)
LYMPHS ABS: 2.6 10*3/uL (ref 0.7–4.0)
LYMPHS PCT: 28 % (ref 12–46)
MCH: 31.6 pg (ref 26.0–34.0)
MCHC: 33.7 g/dL (ref 30.0–36.0)
MCV: 93.7 fL (ref 78.0–100.0)
MONO ABS: 0.7 10*3/uL (ref 0.1–1.0)
MONOS PCT: 8 % (ref 3–12)
Neutro Abs: 5.9 10*3/uL (ref 1.7–7.7)
Neutrophils Relative %: 62 % (ref 43–77)
Platelets: 285 10*3/uL (ref 150–400)
RBC: 3.64 MIL/uL — ABNORMAL LOW (ref 3.87–5.11)
RDW: 12.2 % (ref 11.5–15.5)
WBC: 9.3 10*3/uL (ref 4.0–10.5)

## 2015-01-10 LAB — COMPREHENSIVE METABOLIC PANEL
ALK PHOS: 93 U/L (ref 39–117)
ALT: 30 U/L (ref 0–35)
ANION GAP: 9 (ref 5–15)
AST: 28 U/L (ref 0–37)
Albumin: 3.7 g/dL (ref 3.5–5.2)
BILIRUBIN TOTAL: 0.2 mg/dL — AB (ref 0.3–1.2)
BUN: 9 mg/dL (ref 6–23)
CO2: 25 mmol/L (ref 19–32)
Calcium: 8.6 mg/dL (ref 8.4–10.5)
Chloride: 102 mmol/L (ref 96–112)
Creatinine, Ser: 0.77 mg/dL (ref 0.50–1.10)
GFR calc non Af Amer: >90 mL/min (ref >90)
GLUCOSE: 98 mg/dL (ref 70–99)
POTASSIUM: 3.5 mmol/L (ref 3.5–5.1)
SODIUM: 136 mmol/L (ref 135–145)
Total Protein: 6.8 g/dL (ref 6.0–8.3)

## 2015-01-10 LAB — URINALYSIS, ROUTINE W REFLEX MICROSCOPIC
Bilirubin Urine: NEGATIVE
GLUCOSE, UA: NEGATIVE mg/dL
HGB URINE DIPSTICK: NEGATIVE
Ketones, ur: NEGATIVE mg/dL
Leukocytes, UA: NEGATIVE
Nitrite: NEGATIVE
PH: 6 (ref 5.0–8.0)
Protein, ur: NEGATIVE mg/dL
SPECIFIC GRAVITY, URINE: 1.009 (ref 1.005–1.030)
Urobilinogen, UA: 0.2 mg/dL (ref 0.0–1.0)

## 2015-01-10 LAB — LIPASE, BLOOD: Lipase: 26 U/L (ref 11–59)

## 2015-01-10 MED ORDER — FENTANYL CITRATE 0.05 MG/ML IJ SOLN
100.0000 ug | Freq: Once | INTRAMUSCULAR | Status: AC
  Administered 2015-01-10: 100 ug via INTRAVENOUS
  Filled 2015-01-10: qty 2

## 2015-01-10 MED ORDER — ONDANSETRON 8 MG PO TBDP: 8.0000 mg | ORAL_TABLET | Freq: Three times a day (TID) | ORAL | Status: DC | PRN

## 2015-01-10 MED ORDER — HYDROMORPHONE HCL 2 MG PO TABS: 2.0000 mg | ORAL_TABLET | ORAL | Status: DC | PRN

## 2015-01-10 MED ORDER — SODIUM CHLORIDE 0.9 % IV SOLN
INTRAVENOUS | Status: DC
  Administered 2015-01-10: 03:00:00 via INTRAVENOUS

## 2015-01-10 MED ORDER — ONDANSETRON HCL 4 MG/2ML IJ SOLN
4.0000 mg | Freq: Once | INTRAMUSCULAR | Status: AC
  Administered 2015-01-10: 4 mg via INTRAVENOUS
  Filled 2015-01-10: qty 2

## 2015-01-10 NOTE — ED Notes (Signed)
Pt reports abd pain mid upper onset @ 10 pm

## 2015-01-10 NOTE — ED Provider Notes (Signed)
CSN: [id number]     Arrival date & time 01/10/15  0016 History   First MD Initiated Contact with Patient 01/10/15 0200     Chief Complaint  Patient presents with  . Abdominal Pain     (Consider location/radiation/quality/duration/timing/severity/associated sxs/prior Treatment) HPI  This is a 34 year old female with a history of Roux-en-Y for superior mesenteric artery syndrome, sphincter of Oddi dysfunction and pancreatitis. She is here with left upper quadrant pain which began yesterday evening about 10 PM. She rates the pain is moderate and consistent with previous pancreatitis pain. It is worse with movement or palpation. She denies nausea or vomiting but has had diarrhea.   Past Medical History  Diagnosis Date  . Von Willebrand disease     bruising easy  . Anemia 11/26/2011  . POTS (postural orthostatic tachycardia syndrome)   . Asthma   . Anxiety   . Depression   . H/O hiatal hernia   . Celiac and mesenteric artery injury   . Sphincter of Oddi dysfunction    Past Surgical History  Procedure Laterality Date  . Abdominal hysterectomy    . Laparoscopic endometriosis fulguration    . Roux-en-y procedure    . Cholecystectomy    . Anterior cervical decomp/discectomy fusion N/A 03/31/2013    Procedure: ANTERIOR CERVICAL DECOMPRESSION/DISCECTOMY FUSION 1 LEVEL Cervical five-six;  Surgeon: Reinaldo Meeker, MD;  Location: [facility];  Service: Neurosurgery;  Laterality: N/A;   Family History  Problem Relation Age of Onset  . Hypertension Mother    History  Substance Use Topics  . Smoking status: Never Smoker   . Smokeless tobacco: Never Used  . Alcohol Use: 3.0 oz/week    5 Glasses of wine per week   OB History    No data available     Review of Systems  All other systems reviewed and are negative.   Allergies  Shellfish allergy; Reglan; Wheat bran; Sulfa antibiotics; and Tartrazine  Home Medications   Prior to Admission medications   Medication Sig Start Date  End Date Taking? Authorizing Provider  clonazePAM (KLONOPIN) 1 MG tablet Take 1 tablet (1 mg total) by mouth 2 (two) times daily as needed for anxiety. 12/07/13   Dorothea Ogle, MD  diltiazem (DILACOR XR) 180 MG 24 hr capsule Take 180 mg by mouth every morning.     Historical Provider, MD  DULoxetine (CYMBALTA) 20 MG capsule Take 20 mg by mouth daily.    Historical Provider, MD  EPINEPHrine (EPIPEN) 0.3 mg/0.3 mL SOAJ injection Inject 0.3 mg into the muscle once as needed (severe allergic reaction).    Historical Provider, MD  hydrocortisone sodium succinate (SOLU-CORTEF) 100 mg/2 mL injection Inject 100 mg in the muscle over 30 seconds if you cannot take hydrocortisone by mouth 10/01/13   Carlus Pavlov, MD  HYDROmorphone (DILAUDID) 2 MG tablet Take 1 tablet (2 mg total) by mouth every 4 (four) hours as needed for severe pain (for pain). 11/20/14   Raeford Razor, MD  lipase/protease/amylase (CREON-12/PANCREASE) 12000 UNITS CPEP capsule Take 2 capsules by mouth 3 (three) times daily before meals. Patient not taking: Reported on 11/20/2014 12/07/13   Dorothea Ogle, MD  Lurasidone HCl (LATUDA) 20 MG TABS Take 20 mg by mouth every evening.     Historical Provider, MD  metoprolol (LOPRESSOR) 50 MG tablet Take 75 mg by mouth 2 (two) times daily.    Historical Provider, MD  montelukast (SINGULAIR) 10 MG tablet Take 10 mg by mouth at bedtime.  Historical Provider, MD  ondansetron (ZOFRAN ODT) 4 MG disintegrating tablet Take 1 tablet (4 mg total) by mouth every 8 (eight) hours as needed for nausea or vomiting. 11/17/14   Marlon Peliffany Greene, PA-C  ondansetron (ZOFRAN-ODT) 4 MG disintegrating tablet Take 1-2 tablets (4-8 mg total) by mouth every 8 (eight) hours as needed for nausea or vomiting. Patient not taking: Reported on 11/20/2014 12/07/13   Dorothea OgleIskra M Myers, MD  oxyCODONE-acetaminophen (PERCOCET/ROXICET) 5-325 MG per tablet Take 1-2 tablets by mouth every 4 (four) hours as needed for severe pain. Patient not taking:  Reported on 11/20/2014 12/28/13   Jillyn LedgerJessica K Palmer, PA-C  oxyCODONE-acetaminophen (PERCOCET/ROXICET) 5-325 MG per tablet Take 1 tablet by mouth every 6 (six) hours as needed for severe pain. Patient not taking: Reported on 11/20/2014 01/25/14   Derwood KaplanAnkit Nanavati, MD  promethazine (PHENERGAN) 25 MG suppository Place 1 suppository (25 mg total) rectally every 6 (six) hours as needed for nausea. Patient not taking: Reported on 11/20/2014 01/25/14   Derwood KaplanAnkit Nanavati, MD  promethazine (PHENERGAN) 25 MG tablet Take 1 tablet (25 mg total) by mouth every 6 (six) hours as needed for nausea or vomiting. Patient not taking: Reported on 11/20/2014 08/16/14   Paula LibraJohn Jylan Loeza, MD  promethazine (PHENERGAN) 25 MG tablet Take 1 tablet (25 mg total) by mouth every 6 (six) hours as needed for nausea or vomiting. 11/17/14   Marlon Peliffany Greene, PA-C  zolpidem (AMBIEN) 10 MG tablet Take 1 tablet (10 mg total) by mouth at bedtime. 12/07/13   Dorothea OgleIskra M Myers, MD   BP 108/83 mmHg  Pulse 86  Temp(Src) 98.5 F (36.9 C) (Oral)  Resp 16  SpO2 99%   Physical Exam  General: Well-developed, obese female in no acute distress; appearance consistent with age of record HENT: normocephalic; atraumatic Eyes: pupils equal, round and reactive to light; extraocular muscles intact Neck: supple Heart: regular rate and rhythm Lungs: clear to auscultation bilaterally Abdomen: soft; nondistended; left upper quadrant tenderness; no masses or hepatosplenomegaly; bowel sounds present Extremities: No deformity; full range of motion; pulses normal Neurologic: Awake, alert and oriented; motor function intact in all extremities and symmetric; no facial droop Skin: Warm and dry Psychiatric: Normal mood and affect    ED Course  Procedures (including critical care time)   MDM   Nursing notes and vitals signs, including pulse oximetry, reviewed.  Summary of this visit's results, reviewed by myself:  Labs:  Results for orders placed or performed during the  hospital encounter of 01/10/15 (from the past 24 hour(s))  Urinalysis, Routine w reflex microscopic     Status: None   Collection Time: 01/10/15  2:00 AM  Result Value Ref Range   Color, Urine YELLOW YELLOW   APPearance CLEAR CLEAR   Specific Gravity, Urine 1.009 1.005 - 1.030   pH 6.0 5.0 - 8.0   Glucose, UA NEGATIVE NEGATIVE mg/dL   Hgb urine dipstick NEGATIVE NEGATIVE   Bilirubin Urine NEGATIVE NEGATIVE   Ketones, ur NEGATIVE NEGATIVE mg/dL   Protein, ur NEGATIVE NEGATIVE mg/dL   Urobilinogen, UA 0.2 0.0 - 1.0 mg/dL   Nitrite NEGATIVE NEGATIVE   Leukocytes, UA NEGATIVE NEGATIVE  Comprehensive metabolic panel     Status: Abnormal   Collection Time: 01/10/15  2:45 AM  Result Value Ref Range   Sodium 136 135 - 145 mmol/L   Potassium 3.5 3.5 - 5.1 mmol/L   Chloride 102 96 - 112 mmol/L   CO2 25 19 - 32 mmol/L   Glucose, Bld 98 70 -  99 mg/dL   BUN 9 6 - 23 mg/dL   Creatinine, Ser 1.61 0.50 - 1.10 mg/dL   Calcium 8.6 8.4 - 09.6 mg/dL   Total Protein 6.8 6.0 - 8.3 g/dL   Albumin 3.7 3.5 - 5.2 g/dL   AST 28 0 - 37 U/L   ALT 30 0 - 35 U/L   Alkaline Phosphatase 93 39 - 117 U/L   Total Bilirubin 0.2 (L) 0.3 - 1.2 mg/dL   GFR calc non Af Amer >90 >90 mL/min   GFR calc Af Amer >90 >90 mL/min   Anion gap 9 5 - 15  Lipase, blood     Status: None   Collection Time: 01/10/15  2:45 AM  Result Value Ref Range   Lipase 26 11 - 59 U/L  CBC with Differential/Platelet     Status: Abnormal   Collection Time: 01/10/15  2:45 AM  Result Value Ref Range   WBC 9.3 4.0 - 10.5 K/uL   RBC 3.64 (L) 3.87 - 5.11 MIL/uL   Hemoglobin 11.5 (L) 12.0 - 15.0 g/dL   HCT 04.5 (L) 40.9 - 81.1 %   MCV 93.7 78.0 - 100.0 fL   MCH 31.6 26.0 - 34.0 pg   MCHC 33.7 30.0 - 36.0 g/dL   RDW 91.4 78.2 - 95.6 %   Platelets 285 150 - 400 K/uL   Neutrophils Relative % 62 43 - 77 %   Neutro Abs 5.9 1.7 - 7.7 K/uL   Lymphocytes Relative 28 12 - 46 %   Lymphs Abs 2.6 0.7 - 4.0 K/uL   Monocytes Relative 8 3 - 12 %    Monocytes Absolute 0.7 0.1 - 1.0 K/uL   Eosinophils Relative 2 0 - 5 %   Eosinophils Absolute 0.1 0.0 - 0.7 K/uL   Basophils Relative 0 0 - 1 %   Basophils Absolute 0.0 0.0 - 0.1 K/uL   Patient advised of unremarkable lab findings. Examination not consistent with SBO or other surgical emergency. Patient has follow up at [facility] in two days.    Paula Libra, MD 01/10/15 [phone]

## 2015-01-10 NOTE — ED Notes (Signed)
Pt attempting to contact spouse to transport home after medication.

## 2015-02-05 ENCOUNTER — Emergency Department (HOSPITAL_BASED_OUTPATIENT_CLINIC_OR_DEPARTMENT_OTHER)
Admission: EM | Admit: 2015-02-05 | Discharge: 2015-02-05 | Disposition: A | Discharge status: Home / Self Care | Payer: BLUE CROSS/BLUE SHIELD | Attending: Emergency Medicine | Admitting: Emergency Medicine

## 2015-02-05 ENCOUNTER — Encounter (HOSPITAL_BASED_OUTPATIENT_CLINIC_OR_DEPARTMENT_OTHER): Payer: Self-pay | Admitting: *Deleted

## 2015-02-05 DIAGNOSIS — R1013 Epigastric pain: Secondary | ICD-10-CM | POA: Diagnosis not present

## 2015-02-05 DIAGNOSIS — Z7952 Long term (current) use of systemic steroids: Secondary | ICD-10-CM | POA: Diagnosis not present

## 2015-02-05 DIAGNOSIS — F419 Anxiety disorder, unspecified: Secondary | ICD-10-CM | POA: Diagnosis not present

## 2015-02-05 DIAGNOSIS — Z90710 Acquired absence of both cervix and uterus: Secondary | ICD-10-CM | POA: Diagnosis not present

## 2015-02-05 DIAGNOSIS — Z87828 Personal history of other (healed) physical injury and trauma: Secondary | ICD-10-CM | POA: Insufficient documentation

## 2015-02-05 DIAGNOSIS — Z862 Personal history of diseases of the blood and blood-forming organs and certain disorders involving the immune mechanism: Secondary | ICD-10-CM | POA: Insufficient documentation

## 2015-02-05 DIAGNOSIS — J45909 Unspecified asthma, uncomplicated: Secondary | ICD-10-CM | POA: Insufficient documentation

## 2015-02-05 DIAGNOSIS — Z9889 Other specified postprocedural states: Secondary | ICD-10-CM | POA: Diagnosis not present

## 2015-02-05 DIAGNOSIS — Z79899 Other long term (current) drug therapy: Secondary | ICD-10-CM | POA: Diagnosis not present

## 2015-02-05 DIAGNOSIS — G8929 Other chronic pain: Secondary | ICD-10-CM | POA: Insufficient documentation

## 2015-02-05 DIAGNOSIS — Z8719 Personal history of other diseases of the digestive system: Secondary | ICD-10-CM | POA: Diagnosis not present

## 2015-02-05 DIAGNOSIS — Z9049 Acquired absence of other specified parts of digestive tract: Secondary | ICD-10-CM | POA: Diagnosis not present

## 2015-02-05 DIAGNOSIS — F329 Major depressive disorder, single episode, unspecified: Secondary | ICD-10-CM | POA: Insufficient documentation

## 2015-02-05 DIAGNOSIS — R1011 Right upper quadrant pain: Secondary | ICD-10-CM | POA: Diagnosis present

## 2015-02-05 DIAGNOSIS — R1012 Left upper quadrant pain: Secondary | ICD-10-CM | POA: Diagnosis not present

## 2015-02-05 LAB — CBC WITH DIFFERENTIAL/PLATELET
Basophils Absolute: 0 10*3/uL (ref 0.0–0.1)
Basophils Relative: 0 % (ref 0–1)
EOS PCT: 1 % (ref 0–5)
Eosinophils Absolute: 0.1 10*3/uL (ref 0.0–0.7)
HEMATOCRIT: 38.1 % (ref 36.0–46.0)
Hemoglobin: 12.5 g/dL (ref 12.0–15.0)
Lymphocytes Relative: 24 % (ref 12–46)
Lymphs Abs: 2.3 10*3/uL (ref 0.7–4.0)
MCH: 31.1 pg (ref 26.0–34.0)
MCHC: 32.8 g/dL (ref 30.0–36.0)
MCV: 94.8 fL (ref 78.0–100.0)
MONOS PCT: 7 % (ref 3–12)
Monocytes Absolute: 0.7 10*3/uL (ref 0.1–1.0)
NEUTROS PCT: 68 % (ref 43–77)
Neutro Abs: 6.4 10*3/uL (ref 1.7–7.7)
Platelets: 271 10*3/uL (ref 150–400)
RBC: 4.02 MIL/uL (ref 3.87–5.11)
RDW: 12.4 % (ref 11.5–15.5)
WBC: 9.6 10*3/uL (ref 4.0–10.5)

## 2015-02-05 LAB — URINALYSIS, ROUTINE W REFLEX MICROSCOPIC
Bilirubin Urine: NEGATIVE
Glucose, UA: NEGATIVE mg/dL
Hgb urine dipstick: NEGATIVE
Ketones, ur: NEGATIVE mg/dL
Leukocytes, UA: NEGATIVE
Nitrite: NEGATIVE
Protein, ur: NEGATIVE mg/dL
SPECIFIC GRAVITY, URINE: 1.006 (ref 1.005–1.030)
Urobilinogen, UA: 0.2 mg/dL (ref 0.0–1.0)
pH: 7 (ref 5.0–8.0)

## 2015-02-05 LAB — LIPASE, BLOOD: Lipase: 35 U/L (ref 11–59)

## 2015-02-05 LAB — COMPREHENSIVE METABOLIC PANEL
ALT: 29 U/L (ref 0–35)
AST: 26 U/L (ref 0–37)
Albumin: 4.4 g/dL (ref 3.5–5.2)
Alkaline Phosphatase: 108 U/L (ref 39–117)
Anion gap: 7 (ref 5–15)
BILIRUBIN TOTAL: 0.1 mg/dL — AB (ref 0.3–1.2)
BUN: 13 mg/dL (ref 6–23)
CHLORIDE: 102 mmol/L (ref 96–112)
CO2: 27 mmol/L (ref 19–32)
CREATININE: 0.8 mg/dL (ref 0.50–1.10)
Calcium: 9.3 mg/dL (ref 8.4–10.5)
GFR calc Af Amer: >90 mL/min (ref >90)
Glucose, Bld: 100 mg/dL — ABNORMAL HIGH (ref 70–99)
Potassium: 3.8 mmol/L (ref 3.5–5.1)
Sodium: 136 mmol/L (ref 135–145)
TOTAL PROTEIN: 7.8 g/dL (ref 6.0–8.3)

## 2015-02-05 MED ORDER — SODIUM CHLORIDE 0.9 % IV BOLUS (SEPSIS)
1000.0000 mL | Freq: Once | INTRAVENOUS | Status: AC
  Administered 2015-02-05: 1000 mL via INTRAVENOUS

## 2015-02-05 MED ORDER — HYDROMORPHONE HCL 1 MG/ML IJ SOLN
1.0000 mg | Freq: Once | INTRAMUSCULAR | Status: AC
  Administered 2015-02-05: 1 mg via INTRAVENOUS
  Filled 2015-02-05: qty 1

## 2015-02-05 NOTE — Discharge Instructions (Signed)

## 2015-02-05 NOTE — ED Provider Notes (Signed)
CSN: [id number]     Arrival date & time 02/05/15  1845 History  This chart was scribed for Charlesetta Shanks, MD by Edison Simon, ED Scribe. This patient was seen in room MH01/MH01 and the patient's care was started at 7:21 PM.    Chief Complaint  Patient presents with  . Abdominal Pain   The history is provided by the patient. No language interpreter was used.    HPI Comments: Kaitlyn Johnson is a 34 y.o. female with history of pancreatitis who presents to the Emergency Department complaining of RUQ abdominal pain radiating to back with onset 2 days ago, worse today. She states it is similar to pain from previous flare ups of pancreatitis for which she is seen [facility]. She was last seen there 1 month ago for follow up; she had MRI at that time which did not reveal any fatty pockets and she reports being told that she could have flare ups periodically. She states she does not have a follow up scheduled but typically goes after having a flare up. She has not yet contacted [facility] for current symptoms. She is also followed by gastroenterologist Dr. Earlean Shawl in [place]. She states she is not prescribed any medications for pain except during flare ups. She also has SHx of hysterectomy, Roux-en-y procedure, and cholecystectomy. She denies vomiting, fever, diarhhea, cough, SOB, chest pain, urinary symptoms, or leg or joint swelling.  Past Medical History  Diagnosis Date  . Von Willebrand disease     bruising easy  . Anemia 11/26/2011  . POTS (postural orthostatic tachycardia syndrome)   . Asthma   . Anxiety   . Depression   . H/O hiatal hernia   . Celiac and mesenteric artery injury   . Sphincter of Oddi dysfunction    Past Surgical History  Procedure Laterality Date  . Abdominal hysterectomy    . Laparoscopic endometriosis fulguration    . Roux-en-y procedure    . Cholecystectomy    . Anterior cervical decomp/discectomy fusion N/A 03/31/2013    Procedure: ANTERIOR CERVICAL  DECOMPRESSION/DISCECTOMY FUSION 1 LEVEL Cervical five-six;  Surgeon: Faythe Ghee, MD;  Location: [facility];  Service: Neurosurgery;  Laterality: N/A;   Family History  Problem Relation Age of Onset  . Hypertension Mother    History  Substance Use Topics  . Smoking status: Never Smoker   . Smokeless tobacco: Never Used  . Alcohol Use: 3.0 oz/week    5 Glasses of wine per week   OB History    No data available     Review of Systems 10 Systems reviewed and all are negative for acute change except as noted in the HPI.    Allergies  Shellfish allergy; Reglan; Wheat bran; Sulfa antibiotics; and Tartrazine  Home Medications   Prior to Admission medications   Medication Sig Start Date End Date Taking? Authorizing Provider  clonazePAM (KLONOPIN) 1 MG tablet Take 1 tablet (1 mg total) by mouth 2 (two) times daily as needed for anxiety. 12/07/13  Yes Theodis Blaze, MD  diltiazem (DILACOR XR) 180 MG 24 hr capsule Take 180 mg by mouth every morning.    Yes Historical Provider, MD  DULoxetine (CYMBALTA) 20 MG capsule Take 20 mg by mouth daily.   Yes Historical Provider, MD  EPINEPHrine (EPIPEN) 0.3 mg/0.3 mL SOAJ injection Inject 0.3 mg into the muscle once as needed (severe allergic reaction).   Yes Historical Provider, MD  Lurasidone HCl (LATUDA) 20 MG TABS Take 20 mg by mouth  every evening.    Yes Historical Provider, MD  metoprolol (LOPRESSOR) 50 MG tablet Take 75 mg by mouth 2 (two) times daily.   Yes Historical Provider, MD  montelukast (SINGULAIR) 10 MG tablet Take 10 mg by mouth at bedtime.   Yes Historical Provider, MD  zolpidem (AMBIEN) 10 MG tablet Take 1 tablet (10 mg total) by mouth at bedtime. 12/07/13  Yes Theodis Blaze, MD  hydrocortisone sodium succinate (SOLU-CORTEF) 100 mg/2 mL injection Inject 100 mg in the muscle over 30 seconds if you cannot take hydrocortisone by mouth 10/01/13   Philemon Kingdom, MD  HYDROmorphone (DILAUDID) 2 MG tablet Take 1 tablet (2 mg total) by  mouth every 4 (four) hours as needed for severe pain (for pain). 01/10/15   John Molpus, MD  ondansetron (ZOFRAN ODT) 8 MG disintegrating tablet Take 1 tablet (8 mg total) by mouth every 8 (eight) hours as needed for nausea or vomiting. 01/10/15   John Molpus, MD   BP 120/84 mmHg  Pulse 79  Temp(Src) 98.6 F (37 C) (Oral)  Resp 16  Ht 5' 9"  (1.753 m)  Wt 220 lb (99.791 kg)  BMI 32.47 kg/m2  SpO2 100% Physical Exam  Constitutional: She is oriented to person, place, and time. She appears well-developed and well-nourished.  HENT:  Head: Normocephalic and atraumatic.  Eyes: EOM are normal. Pupils are equal, round, and reactive to light.  Neck: Neck supple.  Cardiovascular: Normal rate, regular rhythm, normal heart sounds and intact distal pulses.   Pulmonary/Chest: Effort normal and breath sounds normal.  Abdominal: Soft. Bowel sounds are normal. She exhibits no distension. There is tenderness.  Patient endorses tenderness in the left upper quadrant. There is no mass or guarding present. The remainder of the abdomen is soft without reproducible tenderness.  Musculoskeletal: Normal range of motion. She exhibits no edema.  Neurological: She is alert and oriented to person, place, and time. She has normal strength. Coordination normal. GCS eye subscore is 4. GCS verbal subscore is 5. GCS motor subscore is 6.  Skin: Skin is warm, dry and intact.  Psychiatric: She has a normal mood and affect.    ED Course  Procedures (including critical care time)  DIAGNOSTIC STUDIES: Oxygen Saturation is 100% on room air, normal by my interpretation.    COORDINATION OF CARE: 7:25 PM Discussed treatment plan with patient at beside, including lab tests. The patient agrees with the plan and has no further questions at this time.   Labs Review Labs Reviewed  COMPREHENSIVE METABOLIC PANEL - Abnormal; Notable for the following:    Glucose, Bld 100 (*)    Total Bilirubin 0.1 (*)    All other components  within normal limits  URINALYSIS, ROUTINE W REFLEX MICROSCOPIC  LIPASE, BLOOD  CBC WITH DIFFERENTIAL/PLATELET    Imaging Review No results found.   EKG Interpretation None      MDM   Final diagnoses:  Epigastric pain   The patient has history of chronic intermittent pain due to prior pancreatitis and other complications of gastroenterologic problems. At this time the patient's vital signs are stable and her diagnostic workup does not show any evidence of acute pancreatitis, dehydration or other suggestion of infectious etiology. The patient's physical examination is for well in appearance and a soft abdomen without surgical findings. Currently feel she is safe to continue working with her outpatient providers regarding pain control and monitoring for any other evolution of the current pain exacerbation.  Charlesetta Shanks, MD 02/05/15 2111

## 2015-02-05 NOTE — ED Notes (Signed)
EDP notified that patient was requesting pain medication.

## 2015-02-05 NOTE — ED Notes (Signed)
Pt reports upper abd pain x 3 days- Hx of pancreatitis

## 2015-06-02 ENCOUNTER — Other Ambulatory Visit: Payer: Self-pay | Admitting: Chiropractic Medicine

## 2015-06-02 ENCOUNTER — Ambulatory Visit
Admission: RE | Admit: 2015-06-02 | Discharge: 2015-06-02 | Disposition: A | Discharge status: Home / Self Care | Payer: BLUE CROSS/BLUE SHIELD | Source: Ambulatory Visit | Attending: Chiropractic Medicine | Admitting: Chiropractic Medicine

## 2015-06-02 DIAGNOSIS — M532X6 Spinal instabilities, lumbar region: Secondary | ICD-10-CM

## 2015-11-04 ENCOUNTER — Other Ambulatory Visit (HOSPITAL_COMMUNITY): Payer: Self-pay | Admitting: Gastroenterology

## 2015-11-04 ENCOUNTER — Encounter (HOSPITAL_COMMUNITY): Payer: Self-pay

## 2015-11-04 ENCOUNTER — Ambulatory Visit (HOSPITAL_COMMUNITY)
Admission: RE | Admit: 2015-11-04 | Discharge: 2015-11-04 | Disposition: A | Discharge status: Home / Self Care | Payer: BLUE CROSS/BLUE SHIELD | Source: Ambulatory Visit | Attending: Gastroenterology | Admitting: Gastroenterology

## 2015-11-04 ENCOUNTER — Emergency Department (HOSPITAL_COMMUNITY)
Admission: EM | Admit: 2015-11-04 | Discharge: 2015-11-04 | Disposition: A | Discharge status: Home / Self Care | Payer: BLUE CROSS/BLUE SHIELD | Attending: Emergency Medicine | Admitting: Emergency Medicine

## 2015-11-04 DIAGNOSIS — R1011 Right upper quadrant pain: Secondary | ICD-10-CM

## 2015-11-04 DIAGNOSIS — Z862 Personal history of diseases of the blood and blood-forming organs and certain disorders involving the immune mechanism: Secondary | ICD-10-CM | POA: Diagnosis not present

## 2015-11-04 DIAGNOSIS — F419 Anxiety disorder, unspecified: Secondary | ICD-10-CM | POA: Diagnosis not present

## 2015-11-04 DIAGNOSIS — Z9049 Acquired absence of other specified parts of digestive tract: Secondary | ICD-10-CM | POA: Diagnosis not present

## 2015-11-04 DIAGNOSIS — R197 Diarrhea, unspecified: Secondary | ICD-10-CM | POA: Diagnosis not present

## 2015-11-04 DIAGNOSIS — Z79899 Other long term (current) drug therapy: Secondary | ICD-10-CM | POA: Insufficient documentation

## 2015-11-04 DIAGNOSIS — J45909 Unspecified asthma, uncomplicated: Secondary | ICD-10-CM | POA: Insufficient documentation

## 2015-11-04 DIAGNOSIS — R7989 Other specified abnormal findings of blood chemistry: Secondary | ICD-10-CM | POA: Diagnosis not present

## 2015-11-04 DIAGNOSIS — R110 Nausea: Secondary | ICD-10-CM | POA: Diagnosis not present

## 2015-11-04 DIAGNOSIS — Z90710 Acquired absence of both cervix and uterus: Secondary | ICD-10-CM | POA: Diagnosis not present

## 2015-11-04 DIAGNOSIS — Z8719 Personal history of other diseases of the digestive system: Secondary | ICD-10-CM | POA: Diagnosis not present

## 2015-11-04 DIAGNOSIS — Z8679 Personal history of other diseases of the circulatory system: Secondary | ICD-10-CM | POA: Diagnosis not present

## 2015-11-04 DIAGNOSIS — Z87828 Personal history of other (healed) physical injury and trauma: Secondary | ICD-10-CM | POA: Diagnosis not present

## 2015-11-04 DIAGNOSIS — R945 Abnormal results of liver function studies: Secondary | ICD-10-CM

## 2015-11-04 DIAGNOSIS — F329 Major depressive disorder, single episode, unspecified: Secondary | ICD-10-CM | POA: Diagnosis not present

## 2015-11-04 LAB — CBC
HCT: 39 % (ref 36.0–46.0)
Hemoglobin: 13.2 g/dL (ref 12.0–15.0)
MCH: 32.7 pg (ref 26.0–34.0)
MCHC: 33.8 g/dL (ref 30.0–36.0)
MCV: 96.5 fL (ref 78.0–100.0)
Platelets: 305 10*3/uL (ref 150–400)
RBC: 4.04 MIL/uL (ref 3.87–5.11)
RDW: 13.6 % (ref 11.5–15.5)
WBC: 6.4 10*3/uL (ref 4.0–10.5)

## 2015-11-04 LAB — COMPREHENSIVE METABOLIC PANEL
ALT: 62 U/L — AB (ref 14–54)
AST: 57 U/L — ABNORMAL HIGH (ref 15–41)
Albumin: 4.6 g/dL (ref 3.5–5.0)
Alkaline Phosphatase: 121 U/L (ref 38–126)
Anion gap: 13 (ref 5–15)
BUN: 16 mg/dL (ref 6–20)
CHLORIDE: 103 mmol/L (ref 101–111)
CO2: 20 mmol/L — ABNORMAL LOW (ref 22–32)
Calcium: 9.3 mg/dL (ref 8.9–10.3)
Creatinine, Ser: 0.92 mg/dL (ref 0.44–1.00)
GFR calc non Af Amer: >60 mL/min (ref >60)
Glucose, Bld: 101 mg/dL — ABNORMAL HIGH (ref 65–99)
Potassium: 3.9 mmol/L (ref 3.5–5.1)
Sodium: 136 mmol/L (ref 135–145)
Total Bilirubin: 0.6 mg/dL (ref 0.3–1.2)
Total Protein: 8.2 g/dL — ABNORMAL HIGH (ref 6.5–8.1)

## 2015-11-04 LAB — LIPASE, BLOOD: LIPASE: 28 U/L (ref 11–51)

## 2015-11-04 MED ORDER — OXYCODONE-ACETAMINOPHEN 5-325 MG PO TABS: 2.0000 | ORAL_TABLET | ORAL | Status: DC | PRN

## 2015-11-04 MED ORDER — SODIUM CHLORIDE 0.9 % IV BOLUS (SEPSIS)
1000.0000 mL | Freq: Once | INTRAVENOUS | Status: AC
  Administered 2015-11-04: 1000 mL via INTRAVENOUS

## 2015-11-04 MED ORDER — ONDANSETRON HCL 4 MG/2ML IJ SOLN
4.0000 mg | Freq: Once | INTRAMUSCULAR | Status: AC
  Administered 2015-11-04: 4 mg via INTRAVENOUS
  Filled 2015-11-04: qty 2

## 2015-11-04 MED ORDER — HYDROMORPHONE HCL 1 MG/ML IJ SOLN
1.0000 mg | Freq: Once | INTRAMUSCULAR | Status: AC
  Administered 2015-11-04: 1 mg via INTRAVENOUS
  Filled 2015-11-04: qty 1

## 2015-11-04 MED ORDER — MORPHINE SULFATE (PF) 4 MG/ML IV SOLN
4.0000 mg | Freq: Once | INTRAVENOUS | Status: AC
Start: 2015-11-04 — End: 2015-11-04
  Administered 2015-11-04: 4 mg via INTRAVENOUS
  Filled 2015-11-04: qty 1

## 2015-11-04 MED ORDER — PROMETHAZINE HCL 25 MG PO TABS: 25.0000 mg | ORAL_TABLET | Freq: Four times a day (QID) | ORAL | Status: DC | PRN

## 2015-11-04 NOTE — ED Notes (Signed)
MRI reports that Pt would have to be discharge prior to MRCP or a new order would need to be place by EDP.  They cannot perform an Outpatient test while the Pt is considered Inpatient.

## 2015-11-04 NOTE — ED Provider Notes (Signed)
CSN: [id number]     Arrival date & time 11/04/15  1140 History   First MD Initiated Contact with Patient 11/04/15 1514     Chief Complaint  Patient presents with  . Abdominal Pain  . Abnormal Lab     (Consider location/radiation/quality/duration/timing/severity/associated sxs/prior Treatment) HPI   Patient is a 35 year old female with past medical history of Sphincter of Oddi dysfunction, hiatal hernia and von Willebrand disease who presents to the ED with complaint of right upper quadrant pain, onset 3 days. Patient endorses having constant sharp stabbing right upper quadrant pain that she notes is worse with sitting up, denies any other aggravating or alleviating factors. Endorses associated nausea and nonbloody diarrhea. Denies fever, chills, cough, difficulty breathing, chest pain, vomiting, urinary symptoms, vaginal bleeding, vaginal discharge for numbness, tingling, weakness. She notes she has had decreased by mouth intake due to nausea and pain. Patient reports she saw her GI doctor, Dr. Kinnie ScalesMedoff, yesterday which resulted in her having a MRCP scheduled for this afternoon at 4 PM. She notes she was notified by Dr. Kinnie ScalesMedoff that her liver enzymes were elevated and she was advised to come to the emergency department prior to having her MRCP done today. Patient states she has [facility] She notes her pain today is similar to pain she has had in the past related to her sphincter of oddi dysfunction. Patient states she has been taking 5 mg Percocet for pain at home with no relief. Endorses abdominal surgical history of partial hysterectomy, cholecystectomy and Roux-en-y procedure due to celiac and mesenteric artery injury.   Past Medical History  Diagnosis Date  . Von Willebrand disease (HCC)     bruising easy  . Anemia 11/26/2011  . POTS (postural orthostatic tachycardia syndrome)   . Asthma   . Anxiety   . Depression   . H/O hiatal hernia   . Celiac and  mesenteric artery injury   . Sphincter of Oddi dysfunction    Past Surgical History  Procedure Laterality Date  . Abdominal hysterectomy    . Laparoscopic endometriosis fulguration    . Roux-en-y procedure    . Cholecystectomy    . Anterior cervical decomp/discectomy fusion N/A 03/31/2013    Procedure: ANTERIOR CERVICAL DECOMPRESSION/DISCECTOMY FUSION 1 LEVEL Cervical five-six;  Surgeon: Reinaldo Meekerandy O Kritzer, MD;  Location: [facility];  Service: Neurosurgery;  Laterality: N/A;  . Lumbar laminectomy     Family History  Problem Relation Age of Onset  . Hypertension Mother    Social History  Substance Use Topics  . Smoking status: Never Smoker   . Smokeless tobacco: Never Used  . Alcohol Use: 3.0 oz/week    5 Glasses of wine per week   OB History    No data available     Review of Systems  Gastrointestinal: Positive for nausea, abdominal pain and diarrhea.  All other systems reviewed and are negative.     Allergies  Bee venom; Shellfish allergy; Reglan; Wheat bran; Sulfa antibiotics; Tartrazine; and Yellow dye  Home Medications   Prior to Admission medications   Medication Sig Start Date End Date Taking? Authorizing Provider  albuterol (PROAIR HFA) 108 (90 Base) MCG/ACT inhaler Inhale 2 puffs into the lungs every 6 (six) hours as needed. Shortness of breath/wheezing 01/20/15  Yes Historical Provider, MD  AMBIEN CR 12.5 MG CR tablet Take 12.5 mg by mouth at bedtime. 10/31/15  Yes Historical Provider, MD  clonazePAM (KLONOPIN) 1 MG tablet Take 1 tablet (1 mg  total) by mouth 2 (two) times daily as needed for anxiety. 12/07/13  Yes Dorothea Ogle, MD  cyanocobalamin (,VITAMIN B-12,) 1000 MCG/ML injection Inject 1,000 mcg into the skin once a week. Monday   Yes Historical Provider, MD  diltiazem (DILACOR XR) 180 MG 24 hr capsule Take 180 mg by mouth every morning.    Yes Historical Provider, MD  DULoxetine (CYMBALTA) 60 MG capsule Take 60 mg by mouth daily.   Yes Historical Provider, MD   EPINEPHrine (EPIPEN) 0.3 mg/0.3 mL SOAJ injection Inject 0.3 mg into the muscle once as needed (severe allergic reaction).   Yes Historical Provider, MD  lamoTRIgine (LAMICTAL) 100 MG tablet Take 100 mg by mouth 2 (two) times daily. 08/03/11  Yes Historical Provider, MD  levalbuterol (XOPENEX) 0.31 MG/3ML nebulizer solution Inhale 3 mLs into the lungs every 6 (six) hours as needed. Shortness of breath/wheezing   Yes Historical Provider, MD  Lurasidone HCl (LATUDA) 20 MG TABS Take 20 mg by mouth every evening.    Yes Historical Provider, MD  metoprolol succinate (TOPROL-XL) 50 MG 24 hr tablet Take 50 mg by mouth 2 (two) times daily. 04/18/15 04/17/16 Yes Historical Provider, MD  montelukast (SINGULAIR) 10 MG tablet Take 10 mg by mouth at bedtime.   Yes Historical Provider, MD  traZODone (DESYREL) 100 MG tablet Take 100 mg by mouth at bedtime. 02/05/15  Yes Historical Provider, MD  zolpidem (AMBIEN) 10 MG tablet Take 1 tablet (10 mg total) by mouth at bedtime. 12/07/13  Yes Dorothea Ogle, MD  hydrocortisone sodium succinate (SOLU-CORTEF) 100 mg/2 mL injection Inject 100 mg in the muscle over 30 seconds if you cannot take hydrocortisone by mouth Patient not taking: Reported on 11/04/2015 10/01/13   Carlus Pavlov, MD  oxyCODONE-acetaminophen (PERCOCET/ROXICET) 5-325 MG tablet Take 2 tablets by mouth every 4 (four) hours as needed for severe pain. 11/04/15   Barrett Henle, PA-C  promethazine (PHENERGAN) 25 MG tablet Take 1 tablet (25 mg total) by mouth every 6 (six) hours as needed for nausea or vomiting. 11/04/15   Satira Sark Catrell Morrone, PA-C   BP 111/79 mmHg  Pulse 80  Temp(Src) 97.7 F (36.5 C) (Oral)  Resp 20  SpO2 100% Physical Exam  Constitutional: She is oriented to person, place, and time. She appears well-developed and well-nourished. No distress.  HENT:  Head: Normocephalic and atraumatic.  Mouth/Throat: Oropharynx is clear and moist. No oropharyngeal exudate.  Eyes: Conjunctivae and  EOM are normal. Right eye exhibits no discharge. Left eye exhibits no discharge. No scleral icterus.  Neck: Normal range of motion. Neck supple.  Cardiovascular: Normal rate, regular rhythm, normal heart sounds and intact distal pulses.   Pulmonary/Chest: Effort normal and breath sounds normal. No respiratory distress. She has no wheezes. She has no rales. She exhibits no tenderness.  Abdominal: Soft. Bowel sounds are normal. She exhibits no distension and no mass. There is tenderness (RUQ). There is no rebound and no guarding.  Musculoskeletal: Normal range of motion. She exhibits no edema.  Lymphadenopathy:    She has no cervical adenopathy.  Neurological: She is alert and oriented to person, place, and time.  Skin: Skin is warm and dry.  Nursing note and vitals reviewed.   ED Course  Procedures (including critical care time) Labs Review Labs Reviewed  COMPREHENSIVE METABOLIC PANEL - Abnormal; Notable for the following:    CO2 20 (*)    Glucose, Bld 101 (*)    Total Protein 8.2 (*)    AST  57 (*)    ALT 62 (*)    All other components within normal limits  LIPASE, BLOOD  CBC  URINALYSIS, ROUTINE W REFLEX MICROSCOPIC (NOT AT [facility])    Imaging Review No results found. I have personally reviewed and evaluated these images and lab results as part of my medical decision-making.    MDM   Final diagnoses:  RUQ pain  Elevated LFTs    Patient presents with right upper quadrant pain with associated nausea and diarrhea. 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 She notes her pain feels consistent with episodes she has had in the past related to her sphincter of oddi dysfunction. GI physician. Patient notes she saw her GI doc, Dr. Kinnie Scales, yesterday was advised she had elevated LFTs, Dr. Kinnie Scales scheduled MRCP at 4 PM this afternoon at [facility] and also advised the pt to come to the emergency department. Denies fever. VSS. Exam  revealed mild RUQ tenderness, no peritoneal signs, moist mucous membranes. AST 57, ALT 62. Pt given pain meds, IVF and zofran in the ED. Pt reports improvement of pain. I spoke with the radiology department who advised that pt would be able to have her MRCP performed today after being d/c even though it was after her original scheduled time. I discussed results with plan to d/c pt and have her immediately leave the ED and go to radiology to have her MRCP performed. Due to pt being hemodynamically stable and having improvement of pain and no episodes of vomiting in the ED I feel that it is appropriate to discharge the pt at this time. I discussed strict return precautions with the pt and advised her to follow up with Dr. Kinnie Scales in 24-48 hours or return to the ED if sxs worsen. Pt agreed to plan.      Satira Sark [place], [place] 11/04/15 2358  Marily Memos, MD 11/05/15 [phone]

## 2015-11-04 NOTE — Discharge Instructions (Signed)
Continue taking your medications as prescribed. After being discharged from the emergency department immediately go [facility] to have your MRCP performed. Follow up with your PCP/GI doctor in 24-48 hours.  Please return to the Emergency Department if symptoms worsen or new onset of fever, vomiting, vomiting blood, bloody diarrhea, abdominal pain, cough, chest pain, difficulty breath, unable to keep fluids down.

## 2015-11-04 NOTE — ED Notes (Addendum)
Pt c/o diarrhea x 5 and RUQ abdominal pain x 3 days.  Pain score 7/10.  Pt reports having lab work yesterday and resulted elevated Liver enzymes.  Sts "this has happened 6-7 times before.  [facility]  [facility]  Hx of Sphincter of Oddi dysfunction.

## 2015-12-22 ENCOUNTER — Other Ambulatory Visit: Payer: Self-pay | Admitting: Family

## 2015-12-22 DIAGNOSIS — R1011 Right upper quadrant pain: Secondary | ICD-10-CM

## 2016-02-02 DIAGNOSIS — R3989 Other symptoms and signs involving the genitourinary system: Secondary | ICD-10-CM | POA: Diagnosis not present

## 2016-02-02 DIAGNOSIS — Z0000 Encounter for general adult medical examination without abnormal findings: Secondary | ICD-10-CM | POA: Diagnosis not present

## 2016-02-02 DIAGNOSIS — N3010 Interstitial cystitis (chronic) without hematuria: Secondary | ICD-10-CM | POA: Diagnosis not present

## 2016-03-05 DIAGNOSIS — Z3169 Encounter for other general counseling and advice on procreation: Secondary | ICD-10-CM | POA: Diagnosis not present

## 2016-03-13 DIAGNOSIS — Z3169 Encounter for other general counseling and advice on procreation: Secondary | ICD-10-CM | POA: Diagnosis not present

## 2016-03-29 DIAGNOSIS — F411 Generalized anxiety disorder: Secondary | ICD-10-CM | POA: Diagnosis not present

## 2016-04-06 DIAGNOSIS — D649 Anemia, unspecified: Secondary | ICD-10-CM | POA: Diagnosis not present

## 2016-04-06 DIAGNOSIS — R252 Cramp and spasm: Secondary | ICD-10-CM | POA: Diagnosis not present

## 2016-06-08 ENCOUNTER — Emergency Department (HOSPITAL_COMMUNITY)
Admission: EM | Admit: 2016-06-08 | Discharge: 2016-06-08 | Disposition: A | Discharge status: Home / Self Care | Payer: BLUE CROSS/BLUE SHIELD | Attending: Emergency Medicine | Admitting: Emergency Medicine

## 2016-06-08 ENCOUNTER — Emergency Department (HOSPITAL_COMMUNITY): Payer: BLUE CROSS/BLUE SHIELD

## 2016-06-08 ENCOUNTER — Encounter (HOSPITAL_COMMUNITY): Payer: Self-pay

## 2016-06-08 DIAGNOSIS — X509XXA Other and unspecified overexertion or strenuous movements or postures, initial encounter: Secondary | ICD-10-CM | POA: Insufficient documentation

## 2016-06-08 DIAGNOSIS — Y999 Unspecified external cause status: Secondary | ICD-10-CM | POA: Insufficient documentation

## 2016-06-08 DIAGNOSIS — M5442 Lumbago with sciatica, left side: Secondary | ICD-10-CM | POA: Diagnosis not present

## 2016-06-08 DIAGNOSIS — Y939 Activity, unspecified: Secondary | ICD-10-CM | POA: Diagnosis not present

## 2016-06-08 DIAGNOSIS — Y929 Unspecified place or not applicable: Secondary | ICD-10-CM | POA: Insufficient documentation

## 2016-06-08 DIAGNOSIS — M545 Low back pain: Secondary | ICD-10-CM | POA: Diagnosis present

## 2016-06-08 DIAGNOSIS — J45909 Unspecified asthma, uncomplicated: Secondary | ICD-10-CM | POA: Diagnosis not present

## 2016-06-08 DIAGNOSIS — R200 Anesthesia of skin: Secondary | ICD-10-CM | POA: Diagnosis not present

## 2016-06-08 MED ORDER — NAPROXEN 500 MG PO TABS: 500.0000 mg | ORAL_TABLET | Freq: Two times a day (BID) | ORAL | 0 refills | Status: DC

## 2016-06-08 MED ORDER — ONDANSETRON 4 MG PO TBDP
4.0000 mg | ORAL_TABLET | Freq: Once | ORAL | Status: AC
Start: 2016-06-08 — End: 2016-06-08
  Administered 2016-06-08: 4 mg via ORAL
  Filled 2016-06-08: qty 1

## 2016-06-08 MED ORDER — OXYCODONE-ACETAMINOPHEN 5-325 MG PO TABS
2.0000 | ORAL_TABLET | Freq: Once | ORAL | Status: AC
  Administered 2016-06-08: 2 via ORAL
  Filled 2016-06-08: qty 2

## 2016-06-08 MED ORDER — OXYCODONE-ACETAMINOPHEN 5-325 MG PO TABS
1.0000 | ORAL_TABLET | ORAL | 0 refills | Status: DC | PRN
Start: 2016-06-08 — End: 2016-06-23

## 2016-06-08 MED ORDER — HYDROCODONE-ACETAMINOPHEN 5-325 MG PO TABS
2.0000 | ORAL_TABLET | Freq: Once | ORAL | Status: AC
  Administered 2016-06-08: 2 via ORAL
  Filled 2016-06-08: qty 2

## 2016-06-08 NOTE — ED Provider Notes (Signed)
[facility]-EMER[facility]ENCY DEPT Provider Note   CSN: [id number] Arrival date & time: 06/08/16  4098  First Provider Contact:  None       History   Chief Complaint Chief Complaint  Patient presents with  . Back Pain    HPI Kaitlyn Johnson is a 35 y.o. female who Presents the emergency department with chief complaint of sudden onset left low back pain with sciatica, some weakness. She has a previous history of idiopathic Cushing's syndrome with spontaneous cervical disc rupture, and previous lumbar light laminectomy done by Dr. Gerlene Fee. Patient states he has been doing well up until one week ago when she was sitting on her floor pending her shoes. As she stood to get up. She felt a sudden pop followed by severe pain. She now has pain radiating down her left leg. Her only position of comfort is to lay flat with a blister under her knees. She complains of some weakness in the left leg, which she thinks she is due to pain. She denies loss of bowel or bladder control, saddle anesthesia, recent procedures to back. She's been taking ibuprofen and muscle relaxers without any relief of her symptoms. She states that she is only really been able to lie in bed.  HPI  Past Medical History:  Diagnosis Date  . Anemia 11/26/2011  . Anxiety   . Asthma   . Celiac and mesenteric artery injury   . Depression   . H/O hiatal hernia   . POTS (postural orthostatic tachycardia syndrome)   . Sphincter of Oddi dysfunction   . Von Willebrand disease (HCC)    bruising easy    Patient Active Problem List   Diagnosis Date Noted  . Nausea with vomiting 11/30/2013  . Abdominal pain, epigastric 11/30/2013  . Dehydration 11/30/2013  . Acute pancreatitis 11/30/2013  . Pancreatitis 11/29/2013  . Gastroenteritis due to norovirus 11/26/2013  . Hypomagnesemia 11/25/2013  . Nausea vomiting and diarrhea 11/23/2013  . Anemia of chronic disease 11/23/2013  . Hypokalemia 11/23/2013  . Hyponatremia 11/23/2013  . Nausea  and vomiting 11/23/2013  . Iatrogenic Cushing's syndrome (HCC) 10/02/2013  . Endometriosis 09/01/2013  . Celiac disease 09/01/2013  . Asthma, chronic 09/01/2013  . 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 POTS (postural orthostatic tachycardia syndrome) 11/26/2011    Past Surgical History:  Procedure Laterality Date  . ABDOMINAL HYSTERECTOMY    . ANTERIOR CERVICAL DECOMP/DISCECTOMY FUSION N/A 03/31/2013   Procedure: ANTERIOR CERVICAL DECOMPRESSION/DISCECTOMY FUSION 1 LEVEL Cervical five-six;  Surgeon: Reinaldo Meeker, MD;  Location: [facility];  Service: Neurosurgery;  Laterality: N/A;  . CHOLECYSTECTOMY    . LAPAROSCOPIC ENDOMETRIOSIS FULGURATION    . LUMBAR LAMINECTOMY    . ROUX-EN-Y PROCEDURE      OB History    No data available       Home Medications    Prior to Admission medications   Medication Sig Start Date End Date Taking? Authorizing Provider  albuterol (PROAIR HFA) 108 (90 Base) MCG/ACT inhaler Inhale 2 puffs into the lungs every 6 (six) hours as needed. Shortness of breath/wheezing 01/20/15  Yes Historical Provider, MD  AMBIEN CR 12.5 MG CR tablet Take 12.5 mg by mouth at bedtime. 10/31/15  Yes Historical Provider, MD  clonazePAM (KLONOPIN) 1 MG tablet Take 1 tablet (1 mg total) by mouth 2 (two) times daily as needed for anxiety. 12/07/13  Yes Dorothea Ogle, MD  cyanocobalamin (,VITAMIN B-12,) 1000 MCG/ML injection Inject 1,000 mcg into the skin every 30 (  thirty) days.    Yes Historical Provider, MD  cyclobenzaprine (FLEXERIL) 10 MG tablet Take 10 mg by mouth every 3 (three) hours as needed for muscle spasms.   Yes Historical Provider, MD  diltiazem (DILACOR XR) 180 MG 24 hr capsule Take 180 mg by mouth every morning.    Yes Historical Provider, MD  DULoxetine (CYMBALTA) 60 MG capsule Take 60 mg by mouth daily.   Yes Historical Provider, MD  lamoTRIgine (LAMICTAL) 100 MG tablet Take 100 mg by mouth at bedtime.  08/03/11  Yes Historical Provider, MD    metoprolol succinate (TOPROL-XL) 50 MG 24 hr tablet Take 50 mg by mouth 2 (two) times daily. 04/18/15 06/08/16 Yes Historical Provider, MD  montelukast (SINGULAIR) 10 MG tablet Take 10 mg by mouth at bedtime.   Yes Historical Provider, MD  traZODone (DESYREL) 100 MG tablet Take 100 mg by mouth at bedtime. 02/05/15  Yes Historical Provider, MD  EPINEPHrine (EPIPEN) 0.3 mg/0.3 mL SOAJ injection Inject 0.3 mg into the muscle once as needed (severe allergic reaction).    Historical Provider, MD  hydrocortisone sodium succinate (SOLU-CORTEF) 100 mg/2 mL injection Inject 100 mg in the muscle over 30 seconds if you cannot take hydrocortisone by mouth Patient not taking: Reported on 11/04/2015 10/01/13   Carlus Pavlovristina Gherghe, MD  levalbuterol (XOPENEX) 0.31 MG/3ML nebulizer solution Inhale 3 mLs into the lungs every 6 (six) hours as needed. Shortness of breath/wheezing    Historical Provider, MD  naproxen (NAPROSYN) 500 MG tablet Take 1 tablet (500 mg total) by mouth 2 (two) times daily with a meal. 06/08/16   Arthor CaptainAbigail Glendene Wyer, PA-C  oxyCODONE-acetaminophen (PERCOCET) 5-325 MG tablet Take 1-2 tablets by mouth every 4 (four) hours as needed. 06/08/16   Arthor CaptainAbigail Trellis Vanoverbeke, PA-C  promethazine (PHENERGAN) 25 MG tablet Take 1 tablet (25 mg total) by mouth every 6 (six) hours as needed for nausea or vomiting. 11/04/15   Barrett HenleNicole Elizabeth Nadeau, PA-C  zolpidem (AMBIEN) 10 MG tablet Take 1 tablet (10 mg total) by mouth at bedtime. Patient not taking: Reported on 06/08/2016 12/07/13   Dorothea OgleIskra M Myers, MD    Family History Family History  Problem Relation Age of Onset  . Hypertension Mother     Social History Social History  Substance Use Topics  . Smoking status: Never Smoker  . Smokeless tobacco: Never Used  . Alcohol use 3.0 oz/week    5 Glasses of wine per week     Allergies   Bee venom; Reglan [metoclopramide]; Shellfish allergy; Wheat bran; Sulfa antibiotics; Tartrazine; and Yellow dye   Review of Systems Review of  Systems Ten systems reviewed and are negative for acute change, except as noted in the HPI.    Physical Exam Updated Vital Signs BP 104/74   Pulse 81   Temp 97.6 F (36.4 C) (Oral)   Resp 20   SpO2 96%   Physical Exam  Constitutional: She is oriented to person, place, and time. She appears well-developed and well-nourished. No distress.  HENT:  Head: Normocephalic and atraumatic.  Eyes: Conjunctivae are normal. No scleral icterus.  Neck: Normal range of motion.  Cardiovascular: Normal rate, regular rhythm and normal heart sounds.  Exam reveals no gallop and no friction rub.   No murmur heard. Pulmonary/Chest: Effort normal and breath sounds normal. No respiratory distress.  Abdominal: Soft. Bowel sounds are normal. She exhibits no distension and no mass. There is no tenderness. There is no guarding.  Musculoskeletal:  Pos Straight leg test on left. BL  lumbar paraspinal tenderness. ROM limited due to pain. Normal BL DTRs. 5/5 strength on R, 4/5 strength on Left  Neurological: She is alert and oriented to person, place, and time.  Skin: Skin is warm and dry. She is not diaphoretic.  Nursing note and vitals reviewed.    ED Treatments / Results  Labs (all labs ordered are listed, but only abnormal results are displayed) Labs Reviewed - No data to display  EKG  EKG Interpretation None       Radiology Mr Lumbar Spine Wo Contrast  Result Date: 06/08/2016 CLINICAL DATA:  Felt pop and back with acute onset of low back pain and numbness into the left lower extremity after bending over five days ago. Personal history of L4-5 laminectomy. Symptoms feel similar to her previous preop radiculitis. EXAM: MRI LUMBAR SPINE WITHOUT CONTRAST TECHNIQUE: Multiplanar, multisequence MR imaging of the lumbar spine was performed. No intravenous contrast was administered. COMPARISON:  MRI of the cervical spine 06/23/2015. FINDINGS: Segmentation: 5 non rib-bearing lumbar type vertebral bodies are  present. Alignment:  AP alignment is anatomic. Vertebrae:  Marrow signal and vertebral body heights are normal. Conus medullaris: Extends to the T12 level and appears normal. Paraspinal and other soft tissues: Limited imaging of the abdomen is unremarkable. There is no significant adenopathy. Disc levels: The disc levels at L3-4 and above are normal. L4-5: Left laminectomy is noted. There is slight displacement of the left L5 nerve roots in the subarticular space. This is markedly improved following resection of previous disc protrusion. Soft tissue occupying the left subdural space may be related to granulation tissue. No significant foraminal narrowing is present. L5-S1:  Negative. IMPRESSION: 1. Postoperative changes on the left at L4-5. 2. Soft tissue within the left epidural space likely represents granulation tissue. A small recurrent disc protrusion is not excluded. Postcontrast images are helpful following surgery to differentiate between recurrent disc material and postoperative granulation tissue. 3. No significant disease at L3-4 and above or at L5-S1. Electronically Signed   By: Marin Roberts M.D.   On: 06/08/2016 12:27    Procedures Procedures (including critical care time)  Medications Ordered in ED Medications  HYDROcodone-acetaminophen (NORCO/VICODIN) 5-325 MG per tablet 2 tablet (2 tablets Oral Given 06/08/16 1048)  ondansetron (ZOFRAN-ODT) disintegrating tablet 4 mg (4 mg Oral Given 06/08/16 1047)  oxyCODONE-acetaminophen (PERCOCET/ROXICET) 5-325 MG per tablet 2 tablet (2 tablets Oral Given 06/08/16 1217)     Initial Impression / Assessment and Plan / ED Course  I have reviewed the triage vital signs and the nursing notes.  Pertinent labs & imaging results that were available during my care of the patient were reviewed by me and considered in my medical decision making (see chart for details).  Clinical Course    Patient MRI with equivocal MRI. I have spoken wit Dr.  Conchita Paris who suggests NSAIDS and close follow up . Will see the patient next Monday or Wednesday. I have discussed findings and plan of care with the patient who agrees with plan . Discussed return precautions. Patient appears safe for discharge at this time  Final Clinical Impressions(s) / ED Diagnoses   Final diagnoses:  Acute back pain with sciatica, left    New Prescriptions Discharge Medication List as of 06/08/2016  2:43 PM    START taking these medications   Details  naproxen (NAPROSYN) 500 MG tablet Take 1 tablet (500 mg total) by mouth 2 (two) times daily with a meal., Starting Fri 06/08/2016, Print  Arthor Captain, PA-C 06/08/16 1639    Tilden Fossa, MD 06/09/16 [phone]

## 2016-06-08 NOTE — ED Triage Notes (Signed)
Pt had laminectomy L4-5 last year. Bent over to put on shoes on Sunday morning, felt pop in back followed by new onset sharp lower back pain w/ numbness in left leg. Feels similar to prior injury to L4-5. Hx Cushings and ruptured disc in neck in past. Advised by [facility] to come to ED b/c no available appointments.

## 2016-06-08 NOTE — ED Notes (Signed)
Pt transported to MRI 

## 2016-06-11 DIAGNOSIS — M5416 Radiculopathy, lumbar region: Secondary | ICD-10-CM | POA: Diagnosis not present

## 2016-06-21 ENCOUNTER — Encounter (HOSPITAL_COMMUNITY): Payer: Self-pay | Admitting: *Deleted

## 2016-06-21 ENCOUNTER — Other Ambulatory Visit: Payer: Self-pay | Admitting: Neurosurgery

## 2016-06-21 NOTE — Progress Notes (Addendum)
Mrs Kaitlyn Johnson has a history of Von Willebrand Disease was followed by Dr Welton FlakesKhan.  Patient has not seen a Hematologist since 2014.  Patient reported that Dr Albin FellingNudkumar spoke with a  Hematologist today and asked patient to arrive at 0915 for testing. (no orders at this time) POTS Syndrome - followed by Dr Lynnea FerrierSolomon[facility]  Patient reported that she does not have problems post op as long as she is not dehydrate. Crushing in remission.  I spoke with Dr Sandford Craze Jackson, she asked for records from Hematologist and Cardiologist to be on the chart- last office visits from both are on the chart.

## 2016-06-22 ENCOUNTER — Observation Stay (HOSPITAL_COMMUNITY)
Admission: RE | Admit: 2016-06-22 | Discharge: 2016-06-23 | Disposition: A | Discharge status: Home / Self Care | Payer: BLUE CROSS/BLUE SHIELD | Source: Ambulatory Visit | Attending: Neurosurgery | Admitting: Neurosurgery

## 2016-06-22 ENCOUNTER — Encounter (HOSPITAL_COMMUNITY): Payer: Self-pay | Admitting: Urology

## 2016-06-22 ENCOUNTER — Encounter (HOSPITAL_COMMUNITY)
Admission: RE | Disposition: A | Discharge status: Home / Self Care | Payer: Self-pay | Source: Ambulatory Visit | Attending: Neurosurgery

## 2016-06-22 ENCOUNTER — Ambulatory Visit (HOSPITAL_COMMUNITY): Payer: BLUE CROSS/BLUE SHIELD | Admitting: Anesthesiology

## 2016-06-22 ENCOUNTER — Ambulatory Visit (HOSPITAL_COMMUNITY): Payer: BLUE CROSS/BLUE SHIELD

## 2016-06-22 DIAGNOSIS — D649 Anemia, unspecified: Secondary | ICD-10-CM | POA: Diagnosis not present

## 2016-06-22 DIAGNOSIS — Z888 Allergy status to other drugs, medicaments and biological substances status: Secondary | ICD-10-CM | POA: Diagnosis not present

## 2016-06-22 DIAGNOSIS — K449 Diaphragmatic hernia without obstruction or gangrene: Secondary | ICD-10-CM | POA: Insufficient documentation

## 2016-06-22 DIAGNOSIS — Z90710 Acquired absence of both cervix and uterus: Secondary | ICD-10-CM | POA: Diagnosis not present

## 2016-06-22 DIAGNOSIS — J45909 Unspecified asthma, uncomplicated: Secondary | ICD-10-CM | POA: Diagnosis not present

## 2016-06-22 DIAGNOSIS — Z87442 Personal history of urinary calculi: Secondary | ICD-10-CM | POA: Diagnosis not present

## 2016-06-22 DIAGNOSIS — Z79899 Other long term (current) drug therapy: Secondary | ICD-10-CM | POA: Insufficient documentation

## 2016-06-22 DIAGNOSIS — F419 Anxiety disorder, unspecified: Secondary | ICD-10-CM | POA: Diagnosis not present

## 2016-06-22 DIAGNOSIS — Z91013 Allergy to seafood: Secondary | ICD-10-CM | POA: Insufficient documentation

## 2016-06-22 DIAGNOSIS — F329 Major depressive disorder, single episode, unspecified: Secondary | ICD-10-CM | POA: Insufficient documentation

## 2016-06-22 DIAGNOSIS — Z91030 Bee allergy status: Secondary | ICD-10-CM | POA: Insufficient documentation

## 2016-06-22 DIAGNOSIS — I10 Essential (primary) hypertension: Secondary | ICD-10-CM | POA: Insufficient documentation

## 2016-06-22 DIAGNOSIS — Z9049 Acquired absence of other specified parts of digestive tract: Secondary | ICD-10-CM | POA: Insufficient documentation

## 2016-06-22 DIAGNOSIS — Z9102 Food additives allergy status: Secondary | ICD-10-CM | POA: Insufficient documentation

## 2016-06-22 DIAGNOSIS — E249 Cushing's syndrome, unspecified: Secondary | ICD-10-CM | POA: Diagnosis not present

## 2016-06-22 DIAGNOSIS — D680 Von Willebrand's disease: Secondary | ICD-10-CM | POA: Insufficient documentation

## 2016-06-22 DIAGNOSIS — Z6832 Body mass index (BMI) 32.0-32.9, adult: Secondary | ICD-10-CM

## 2016-06-22 DIAGNOSIS — Z882 Allergy status to sulfonamides status: Secondary | ICD-10-CM | POA: Diagnosis not present

## 2016-06-22 DIAGNOSIS — M5126 Other intervertebral disc displacement, lumbar region: Secondary | ICD-10-CM

## 2016-06-22 DIAGNOSIS — M5127 Other intervertebral disc displacement, lumbosacral region: Secondary | ICD-10-CM | POA: Diagnosis not present

## 2016-06-22 DIAGNOSIS — M5416 Radiculopathy, lumbar region: Secondary | ICD-10-CM | POA: Diagnosis not present

## 2016-06-22 DIAGNOSIS — Z419 Encounter for procedure for purposes other than remedying health state, unspecified: Secondary | ICD-10-CM

## 2016-06-22 HISTORY — DX: Cushing's syndrome, unspecified: E24.9

## 2016-06-22 HISTORY — DX: Personal history of urinary calculi: Z87.442

## 2016-06-22 HISTORY — DX: Reserved for inherently not codable concepts without codable children: IMO0001

## 2016-06-22 HISTORY — PX: LUMBAR LAMINECTOMY/DECOMPRESSION MICRODISCECTOMY: SHX5026

## 2016-06-22 LAB — BASIC METABOLIC PANEL
Anion gap: 8 (ref 5–15)
BUN: 14 mg/dL (ref 6–20)
CALCIUM: 9 mg/dL (ref 8.9–10.3)
CHLORIDE: 108 mmol/L (ref 101–111)
CO2: 22 mmol/L (ref 22–32)
CREATININE: 0.85 mg/dL (ref 0.44–1.00)
Glucose, Bld: 88 mg/dL (ref 65–99)
Potassium: 4 mmol/L (ref 3.5–5.1)
SODIUM: 138 mmol/L (ref 135–145)

## 2016-06-22 LAB — CBC
HCT: 36.6 % (ref 36.0–46.0)
Hemoglobin: 12.1 g/dL (ref 12.0–15.0)
MCH: 31 pg (ref 26.0–34.0)
MCHC: 33.1 g/dL (ref 30.0–36.0)
MCV: 93.8 fL (ref 78.0–100.0)
PLATELETS: 308 10*3/uL (ref 150–400)
RBC: 3.9 MIL/uL (ref 3.87–5.11)
RDW: 11.9 % (ref 11.5–15.5)
WBC: 7.5 10*3/uL (ref 4.0–10.5)

## 2016-06-22 LAB — SURGICAL PCR SCREEN
MRSA, PCR: NEGATIVE
STAPHYLOCOCCUS AUREUS: NEGATIVE

## 2016-06-22 SURGERY — LUMBAR LAMINECTOMY/DECOMPRESSION MICRODISCECTOMY 1 LEVEL
Anesthesia: General | Laterality: Left

## 2016-06-22 MED ORDER — OXYCODONE-ACETAMINOPHEN 5-325 MG PO TABS
1.0000 | ORAL_TABLET | ORAL | Status: DC | PRN
  Administered 2016-06-22 – 2016-06-23 (×3): 2 via ORAL
  Filled 2016-06-22 (×3): qty 2

## 2016-06-22 MED ORDER — DEXAMETHASONE SODIUM PHOSPHATE 10 MG/ML IJ SOLN
INTRAMUSCULAR | Status: DC | PRN
  Administered 2016-06-22: 10 mg via INTRAVENOUS

## 2016-06-22 MED ORDER — CLONAZEPAM 0.5 MG PO TABS: 1.0000 mg | ORAL_TABLET | Freq: Two times a day (BID) | ORAL | Status: DC | PRN

## 2016-06-22 MED ORDER — CYCLOBENZAPRINE HCL 10 MG PO TABS
10.0000 mg | ORAL_TABLET | ORAL | Status: DC | PRN
  Administered 2016-06-22 – 2016-06-23 (×4): 10 mg via ORAL
  Filled 2016-06-22 (×4): qty 1

## 2016-06-22 MED ORDER — ACETAMINOPHEN 10 MG/ML IV SOLN
INTRAVENOUS | Status: AC
  Filled 2016-06-22: qty 100

## 2016-06-22 MED ORDER — DILTIAZEM HCL ER COATED BEADS 180 MG PO CP24
180.0000 mg | ORAL_CAPSULE | ORAL | Status: DC
  Administered 2016-06-23: 180 mg via ORAL
  Filled 2016-06-22: qty 1

## 2016-06-22 MED ORDER — LURASIDONE HCL 40 MG PO TABS
60.0000 mg | ORAL_TABLET | Freq: Every day | ORAL | Status: DC
  Administered 2016-06-22: 60 mg via ORAL
  Filled 2016-06-22: qty 1

## 2016-06-22 MED ORDER — MUPIROCIN 2 % EX OINT
TOPICAL_OINTMENT | Freq: Once | CUTANEOUS | Status: AC
  Administered 2016-06-22: 11:00:00 via NASAL

## 2016-06-22 MED ORDER — MENTHOL 3 MG MT LOZG: 1.0000 | LOZENGE | OROMUCOSAL | Status: DC | PRN

## 2016-06-22 MED ORDER — LIDOCAINE-EPINEPHRINE 1 %-1:100000 IJ SOLN
INTRAMUSCULAR | Status: DC | PRN
  Administered 2016-06-22: 10 mL

## 2016-06-22 MED ORDER — SODIUM CHLORIDE 0.9% FLUSH: 3.0000 mL | Freq: Two times a day (BID) | INTRAVENOUS | Status: DC

## 2016-06-22 MED ORDER — SODIUM CHLORIDE 0.9 % IV SOLN
INTRAVENOUS | Status: DC
  Administered 2016-06-22: 16:00:00 via INTRAVENOUS

## 2016-06-22 MED ORDER — DULOXETINE HCL 30 MG PO CPEP: 30.0000 mg | ORAL_CAPSULE | Freq: Every day | ORAL | Status: DC

## 2016-06-22 MED ORDER — METHYLPREDNISOLONE ACETATE 80 MG/ML IJ SUSP: INTRAMUSCULAR | Status: DC | PRN

## 2016-06-22 MED ORDER — DEXMEDETOMIDINE HCL 200 MCG/2ML IV SOLN
INTRAVENOUS | Status: DC | PRN
  Administered 2016-06-22 (×5): 8 ug via INTRAVENOUS

## 2016-06-22 MED ORDER — ALBUTEROL SULFATE (2.5 MG/3ML) 0.083% IN NEBU: 3.0000 mL | INHALATION_SOLUTION | Freq: Four times a day (QID) | RESPIRATORY_TRACT | Status: DC | PRN

## 2016-06-22 MED ORDER — CHLORHEXIDINE GLUCONATE CLOTH 2 % EX PADS: 6.0000 | MEDICATED_PAD | Freq: Once | CUTANEOUS | Status: DC

## 2016-06-22 MED ORDER — ZOLPIDEM TARTRATE 5 MG PO TABS: 5.0000 mg | ORAL_TABLET | Freq: Every evening | ORAL | Status: DC | PRN

## 2016-06-22 MED ORDER — ROCURONIUM BROMIDE 100 MG/10ML IV SOLN
INTRAVENOUS | Status: DC | PRN
  Administered 2016-06-22: 70 mg via INTRAVENOUS
  Administered 2016-06-22: 10 mg via INTRAVENOUS

## 2016-06-22 MED ORDER — LURASIDONE HCL 60 MG PO TABS: 1.0000 | ORAL_TABLET | Freq: Every day | ORAL | Status: DC

## 2016-06-22 MED ORDER — ROCURONIUM BROMIDE 10 MG/ML (PF) SYRINGE
PREFILLED_SYRINGE | INTRAVENOUS | Status: AC
  Filled 2016-06-22: qty 10

## 2016-06-22 MED ORDER — BUPIVACAINE HCL (PF) 0.5 % IJ SOLN
INTRAMUSCULAR | Status: DC | PRN
  Administered 2016-06-22: 10 mL

## 2016-06-22 MED ORDER — DOCUSATE SODIUM 100 MG PO CAPS
100.0000 mg | ORAL_CAPSULE | Freq: Two times a day (BID) | ORAL | Status: DC
  Administered 2016-06-22: 100 mg via ORAL
  Filled 2016-06-22 (×2): qty 1

## 2016-06-22 MED ORDER — ACETAMINOPHEN 325 MG PO TABS: 650.0000 mg | ORAL_TABLET | ORAL | Status: DC | PRN

## 2016-06-22 MED ORDER — CEFAZOLIN SODIUM-DEXTROSE 2-4 GM/100ML-% IV SOLN
2.0000 g | INTRAVENOUS | Status: AC
  Administered 2016-06-22: 2 g via INTRAVENOUS

## 2016-06-22 MED ORDER — TRAZODONE HCL 100 MG PO TABS
100.0000 mg | ORAL_TABLET | Freq: Every day | ORAL | Status: DC
  Administered 2016-06-22: 100 mg via ORAL
  Filled 2016-06-22: qty 1

## 2016-06-22 MED ORDER — LAMOTRIGINE 100 MG PO TABS
100.0000 mg | ORAL_TABLET | Freq: Every day | ORAL | Status: DC
  Administered 2016-06-22: 100 mg via ORAL
  Filled 2016-06-22: qty 1

## 2016-06-22 MED ORDER — THROMBIN 5000 UNITS EX SOLR
OROMUCOSAL | Status: DC | PRN
  Administered 2016-06-22: 12:00:00 via TOPICAL

## 2016-06-22 MED ORDER — ACETAMINOPHEN 650 MG RE SUPP
650.0000 mg | RECTAL | Status: DC | PRN
Start: 2016-06-22 — End: 2016-06-23

## 2016-06-22 MED ORDER — SODIUM CHLORIDE 0.9 % IV SOLN
20.0000 ug | Freq: Once | INTRAVENOUS | Status: AC
  Administered 2016-06-22: 20 ug via INTRAVENOUS
  Filled 2016-06-22: qty 5

## 2016-06-22 MED ORDER — MIDAZOLAM HCL 5 MG/5ML IJ SOLN
INTRAMUSCULAR | Status: DC | PRN
  Administered 2016-06-22: 2 mg via INTRAVENOUS

## 2016-06-22 MED ORDER — CEFAZOLIN SODIUM-DEXTROSE 2-4 GM/100ML-% IV SOLN
2.0000 g | Freq: Three times a day (TID) | INTRAVENOUS | Status: AC
  Administered 2016-06-22 – 2016-06-23 (×2): 2 g via INTRAVENOUS
  Filled 2016-06-22 (×2): qty 100

## 2016-06-22 MED ORDER — SUGAMMADEX SODIUM 200 MG/2ML IV SOLN
INTRAVENOUS | Status: AC
  Filled 2016-06-22: qty 2

## 2016-06-22 MED ORDER — CEFAZOLIN SODIUM-DEXTROSE 2-4 GM/100ML-% IV SOLN
INTRAVENOUS | Status: AC
  Filled 2016-06-22: qty 100

## 2016-06-22 MED ORDER — MONTELUKAST SODIUM 10 MG PO TABS
10.0000 mg | ORAL_TABLET | Freq: Every day | ORAL | Status: DC
Start: 2016-06-22 — End: 2016-06-23
  Administered 2016-06-22: 10 mg via ORAL
  Filled 2016-06-22: qty 1

## 2016-06-22 MED ORDER — ONDANSETRON HCL 4 MG/2ML IJ SOLN: 4.0000 mg | INTRAMUSCULAR | Status: DC | PRN

## 2016-06-22 MED ORDER — MUPIROCIN 2 % EX OINT
TOPICAL_OINTMENT | CUTANEOUS | Status: AC
  Filled 2016-06-22: qty 22

## 2016-06-22 MED ORDER — HYDROMORPHONE HCL 1 MG/ML IJ SOLN
INTRAMUSCULAR | Status: AC
  Filled 2016-06-22: qty 1

## 2016-06-22 MED ORDER — PROPOFOL 10 MG/ML IV BOLUS
INTRAVENOUS | Status: AC
  Filled 2016-06-22: qty 20

## 2016-06-22 MED ORDER — ACETAMINOPHEN 10 MG/ML IV SOLN
INTRAVENOUS | Status: DC | PRN
  Administered 2016-06-22: 1000 mg via INTRAVENOUS

## 2016-06-22 MED ORDER — FAMOTIDINE IN NACL 20-0.9 MG/50ML-% IV SOLN
20.0000 mg | Freq: Two times a day (BID) | INTRAVENOUS | Status: DC
  Administered 2016-06-22: 20 mg via INTRAVENOUS
  Filled 2016-06-22: qty 50

## 2016-06-22 MED ORDER — PROPOFOL 10 MG/ML IV BOLUS
INTRAVENOUS | Status: DC | PRN
  Administered 2016-06-22: 200 mg via INTRAVENOUS

## 2016-06-22 MED ORDER — PHENOL 1.4 % MT LIQD: 1.0000 | OROMUCOSAL | Status: DC | PRN

## 2016-06-22 MED ORDER — ALBUTEROL SULFATE (2.5 MG/3ML) 0.083% IN NEBU
2.5000 mg | INHALATION_SOLUTION | Freq: Four times a day (QID) | RESPIRATORY_TRACT | Status: DC
  Filled 2016-06-22: qty 3

## 2016-06-22 MED ORDER — SENNA 8.6 MG PO TABS
1.0000 | ORAL_TABLET | Freq: Two times a day (BID) | ORAL | Status: DC
  Administered 2016-06-22: 8.6 mg via ORAL
  Filled 2016-06-22: qty 1

## 2016-06-22 MED ORDER — HYDROXYZINE HCL 10 MG PO TABS
10.0000 mg | ORAL_TABLET | Freq: Every day | ORAL | Status: DC
  Administered 2016-06-23: 10 mg via ORAL
  Filled 2016-06-22 (×2): qty 1

## 2016-06-22 MED ORDER — THROMBIN 5000 UNITS EX SOLR
CUTANEOUS | Status: DC | PRN
  Administered 2016-06-22 (×2): 5000 [IU] via TOPICAL

## 2016-06-22 MED ORDER — HYDROMORPHONE HCL 1 MG/ML IJ SOLN
0.5000 mg | INTRAMUSCULAR | Status: DC | PRN
  Administered 2016-06-22 – 2016-06-23 (×4): 1 mg via INTRAVENOUS
  Filled 2016-06-22 (×4): qty 1

## 2016-06-22 MED ORDER — SODIUM CHLORIDE 0.9 % IV SOLN: 250.0000 mL | INTRAVENOUS | Status: DC

## 2016-06-22 MED ORDER — CYANOCOBALAMIN 1000 MCG/ML IJ SOLN: 1000.0000 ug | INTRAMUSCULAR | Status: DC

## 2016-06-22 MED ORDER — SODIUM CHLORIDE 0.9% FLUSH: 3.0000 mL | INTRAVENOUS | Status: DC | PRN

## 2016-06-22 MED ORDER — DEXAMETHASONE SODIUM PHOSPHATE 10 MG/ML IJ SOLN
INTRAMUSCULAR | Status: AC
  Filled 2016-06-22: qty 1

## 2016-06-22 MED ORDER — ONDANSETRON HCL 4 MG/2ML IJ SOLN
INTRAMUSCULAR | Status: AC
  Filled 2016-06-22: qty 2

## 2016-06-22 MED ORDER — DEXMEDETOMIDINE HCL IN NACL 200 MCG/50ML IV SOLN
INTRAVENOUS | Status: AC
  Filled 2016-06-22: qty 50

## 2016-06-22 MED ORDER — EPINEPHRINE 0.3 MG/0.3ML IJ SOAJ
0.3000 mg | Freq: Once | INTRAMUSCULAR | Status: DC | PRN
  Filled 2016-06-22: qty 0.3

## 2016-06-22 MED ORDER — LIDOCAINE 2% (20 MG/ML) 5 ML SYRINGE
INTRAMUSCULAR | Status: AC
  Filled 2016-06-22: qty 5

## 2016-06-22 MED ORDER — FENTANYL CITRATE (PF) 100 MCG/2ML IJ SOLN
INTRAMUSCULAR | Status: DC | PRN
  Administered 2016-06-22 (×6): 50 ug via INTRAVENOUS

## 2016-06-22 MED ORDER — FENTANYL CITRATE (PF) 100 MCG/2ML IJ SOLN
INTRAMUSCULAR | Status: AC
  Filled 2016-06-22: qty 2

## 2016-06-22 MED ORDER — METOPROLOL SUCCINATE ER 25 MG PO TB24
50.0000 mg | ORAL_TABLET | Freq: Two times a day (BID) | ORAL | Status: DC
  Administered 2016-06-22 – 2016-06-23 (×2): 50 mg via ORAL
  Filled 2016-06-22 (×2): qty 2

## 2016-06-22 MED ORDER — BISACODYL 10 MG RE SUPP
10.0000 mg | Freq: Every day | RECTAL | Status: DC | PRN
Start: 2016-06-22 — End: 2016-06-23

## 2016-06-22 MED ORDER — ONDANSETRON HCL 4 MG/2ML IJ SOLN
INTRAMUSCULAR | Status: DC | PRN
  Administered 2016-06-22: 4 mg via INTRAVENOUS

## 2016-06-22 MED ORDER — FENTANYL CITRATE (PF) 100 MCG/2ML IJ SOLN
INTRAMUSCULAR | Status: AC
Start: 2016-06-22 — End: 2016-06-22
  Filled 2016-06-22: qty 4

## 2016-06-22 MED ORDER — HYDROMORPHONE HCL 1 MG/ML IJ SOLN
0.2500 mg | INTRAMUSCULAR | Status: DC | PRN
  Administered 2016-06-22 (×2): 0.5 mg via INTRAVENOUS

## 2016-06-22 MED ORDER — SUGAMMADEX SODIUM 200 MG/2ML IV SOLN
INTRAVENOUS | Status: DC | PRN
  Administered 2016-06-22: 200 mg via INTRAVENOUS

## 2016-06-22 MED ORDER — LACTATED RINGERS IV SOLN
INTRAVENOUS | Status: DC
  Administered 2016-06-22 (×3): via INTRAVENOUS

## 2016-06-22 MED ORDER — HEMOSTATIC AGENTS (NO CHARGE) OPTIME
TOPICAL | Status: DC | PRN
  Administered 2016-06-22: 1 via TOPICAL

## 2016-06-22 MED ORDER — LIDOCAINE HCL (CARDIAC) 20 MG/ML IV SOLN
INTRAVENOUS | Status: DC | PRN
  Administered 2016-06-22: 100 mg via INTRAVENOUS

## 2016-06-22 MED ORDER — MIDAZOLAM HCL 2 MG/2ML IJ SOLN
INTRAMUSCULAR | Status: AC
  Filled 2016-06-22: qty 2

## 2016-06-22 MED ORDER — PROMETHAZINE HCL 25 MG PO TABS: 25.0000 mg | ORAL_TABLET | Freq: Four times a day (QID) | ORAL | Status: DC | PRN

## 2016-06-22 MED ORDER — SODIUM CHLORIDE 0.9 % IR SOLN
Status: DC | PRN
  Administered 2016-06-22: 12:00:00

## 2016-06-22 SURGICAL SUPPLY — 58 items
APL SKNCLS STERI-STRIP NONHPOA (GAUZE/BANDAGES/DRESSINGS) ×1
BAG DECANTER FOR FLEXI CONT (MISCELLANEOUS) ×2 IMPLANT
BENZOIN TINCTURE PRP APPL 2/3 (GAUZE/BANDAGES/DRESSINGS) ×1 IMPLANT
BLADE CLIPPER SURG (BLADE) IMPLANT
BLADE SURG 11 STRL SS (BLADE) ×2 IMPLANT
BUR MATCHSTICK NEURO 3.0 LAGG (BURR) ×2 IMPLANT
CANISTER SUCT 3000ML PPV (MISCELLANEOUS) ×2 IMPLANT
DECANTER SPIKE VIAL GLASS SM (MISCELLANEOUS) ×2 IMPLANT
DRAPE LAPAROTOMY 100X72X124 (DRAPES) ×2 IMPLANT
DRAPE MICROSCOPE LEICA (MISCELLANEOUS) ×2 IMPLANT
DRAPE POUCH INSTRU U-SHP 10X18 (DRAPES) ×2 IMPLANT
DRAPE SURG 17X23 STRL (DRAPES) ×2 IMPLANT
DRSG OPSITE POSTOP 3X4 (GAUZE/BANDAGES/DRESSINGS) ×2 IMPLANT
DURAPREP 26ML APPLICATOR (WOUND CARE) ×2 IMPLANT
ELECT REM PT RETURN 9FT ADLT (ELECTROSURGICAL) ×2
ELECTRODE REM PT RTRN 9FT ADLT (ELECTROSURGICAL) ×1 IMPLANT
GAUZE SPONGE 4X4 12PLY STRL (GAUZE/BANDAGES/DRESSINGS) IMPLANT
GAUZE SPONGE 4X4 16PLY XRAY LF (GAUZE/BANDAGES/DRESSINGS) IMPLANT
GLOVE BIO SURGEON STRL SZ8 (GLOVE) ×1 IMPLANT
GLOVE BIOGEL PI IND STRL 7.5 (GLOVE) ×1 IMPLANT
GLOVE BIOGEL PI INDICATOR 7.5 (GLOVE) ×1
GLOVE ECLIPSE 7.0 STRL STRAW (GLOVE) ×2 IMPLANT
GLOVE ECLIPSE 7.5 STRL STRAW (GLOVE) ×3 IMPLANT
GLOVE EXAM NITRILE LRG STRL (GLOVE) IMPLANT
GLOVE EXAM NITRILE XL STR (GLOVE) IMPLANT
GLOVE EXAM NITRILE XS STR PU (GLOVE) IMPLANT
GLOVE INDICATOR 7.5 STRL GRN (GLOVE) ×1 IMPLANT
GLOVE INDICATOR 8.0 STRL GRN (GLOVE) ×1 IMPLANT
GOWN STRL REUS W/ TWL LRG LVL3 (GOWN DISPOSABLE) ×2 IMPLANT
GOWN STRL REUS W/ TWL XL LVL3 (GOWN DISPOSABLE) IMPLANT
GOWN STRL REUS W/TWL 2XL LVL3 (GOWN DISPOSABLE) IMPLANT
GOWN STRL REUS W/TWL LRG LVL3 (GOWN DISPOSABLE) ×4
GOWN STRL REUS W/TWL XL LVL3 (GOWN DISPOSABLE) ×2
HEMOSTAT POWDER KIT SURGIFOAM (HEMOSTASIS) ×2 IMPLANT
KIT BASIN OR (CUSTOM PROCEDURE TRAY) ×2 IMPLANT
KIT ROOM TURNOVER OR (KITS) ×2 IMPLANT
LIQUID BAND (GAUZE/BANDAGES/DRESSINGS) ×2 IMPLANT
NDL HYPO 18GX1.5 BLUNT FILL (NEEDLE) IMPLANT
NDL HYPO 25X1 1.5 SAFETY (NEEDLE) ×1 IMPLANT
NDL SPNL 18GX3.5 QUINCKE PK (NEEDLE) IMPLANT
NEEDLE HYPO 18GX1.5 BLUNT FILL (NEEDLE) IMPLANT
NEEDLE HYPO 25X1 1.5 SAFETY (NEEDLE) ×2 IMPLANT
NEEDLE SPNL 18GX3.5 QUINCKE PK (NEEDLE) IMPLANT
NS IRRIG 1000ML POUR BTL (IV SOLUTION) ×2 IMPLANT
PACK LAMINECTOMY NEURO (CUSTOM PROCEDURE TRAY) ×2 IMPLANT
PAD ARMBOARD 7.5X6 YLW CONV (MISCELLANEOUS) ×6 IMPLANT
RUBBERBAND STERILE (MISCELLANEOUS) ×4 IMPLANT
SPONGE LAP 4X18 X RAY DECT (DISPOSABLE) IMPLANT
SPONGE SURGIFOAM ABS GEL SZ50 (HEMOSTASIS) ×2 IMPLANT
STRIP CLOSURE SKIN 1/2X4 (GAUZE/BANDAGES/DRESSINGS) ×1 IMPLANT
SUT VIC AB 0 CT1 18XCR BRD8 (SUTURE) ×1 IMPLANT
SUT VIC AB 0 CT1 8-18 (SUTURE) ×2
SUT VIC AB 2-0 CT1 18 (SUTURE) IMPLANT
SUT VICRYL 3-0 RB1 18 ABS (SUTURE) ×4 IMPLANT
SYR 3ML LL SCALE MARK (SYRINGE) IMPLANT
TOWEL OR 17X24 6PK STRL BLUE (TOWEL DISPOSABLE) ×2 IMPLANT
TOWEL OR 17X26 10 PK STRL BLUE (TOWEL DISPOSABLE) ×2 IMPLANT
WATER STERILE IRR 1000ML POUR (IV SOLUTION) ×2 IMPLANT

## 2016-06-22 NOTE — H&P (Signed)
CC:  No chief complaint on file.   HPI: Kaitlyn Johnson is a 35 year old woman I'm seeing for the above complaint, although she is an established patient of our recently retired partner, Dr. Hal Neer. She underwent a left-sided L4 L5 interlaminar decompression with microdiscectomy in 2016. Since that surgery, she did very well, with essentially complete resolution of her left leg pain. Unfortunately, about 8 days ago, she had recurrence of her pain while she was bending over to put on her shoes before going to church. Since then, she has been having left-sided back pain, with radiation to the left hip and buttock, down the back of her left thigh and into the lateral aspect of her calf. She does not have any significant foot symptoms. She does have a history of Cushing's disease, and is therefore unable to tolerate steroids. She was seen in the emergency department where MRI was completed, and she was started on a course of Percocet and naproxen. She says this does help somewhat with the pain, but last only for about 3 hours or so. She does not have any significant right leg symptoms. She does not have any changes in bowel or bladder function.   PMH: Past Medical History:  Diagnosis Date  . Anemia 11/26/2011  . Anxiety   . Asthma   . Celiac and mesenteric artery injury   . Cushing's syndrome ([place])    06/21/16- "in remission"  . Depression   . H/O hiatal hernia   . History of kidney stones   . POTS (postural orthostatic tachycardia syndrome)   . Shortness of breath dyspnea    with exertertion  . Sphincter of Oddi dysfunction   . Von Willebrand disease ([place])    bruising easy    PSH: Past Surgical History:  Procedure Laterality Date  . ABDOMINAL HYSTERECTOMY  08/2010  . ANTERIOR CERVICAL DECOMP/DISCECTOMY FUSION N/A 03/31/2013   Procedure: ANTERIOR CERVICAL DECOMPRESSION/DISCECTOMY FUSION 1 LEVEL Cervical five-six;  Surgeon: Faythe Ghee, MD;  Location: [facility];  Service: Neurosurgery;   Laterality: N/A;  . Cholangio-Pancreatography with Spintectorotomy + Stent  07/16/2012   01/15/14  . CHOLECYSTECTOMY    . LAPAROSCOPIC ENDOMETRIOSIS FULGURATION    . LUMBAR LAMINECTOMY    . ROUX-EN-Y PROCEDURE  04/1999    SH: Social History  Substance Use Topics  . Smoking status: Never Smoker  . Smokeless tobacco: Never Used  . Alcohol use 3.0 oz/week    5 Glasses of wine per week    MEDS: Prior to Admission medications   Medication Sig Start Date End Date Taking? Authorizing Provider  AMBIEN CR 12.5 MG CR tablet Take 12.5 mg by mouth at bedtime. 10/31/15  Yes Historical Provider, MD  clonazePAM (KLONOPIN) 1 MG tablet Take 1 tablet (1 mg total) by mouth 2 (two) times daily as needed for anxiety. 12/07/13  Yes Theodis Blaze, MD  cyclobenzaprine (FLEXERIL) 10 MG tablet Take 10 mg by mouth every 3 (three) hours as needed for muscle spasms.   Yes Historical Provider, MD  diltiazem (CARDIZEM CD) 180 MG 24 hr capsule Take 1 capsule by mouth every morning.  05/29/16  Yes Historical Provider, MD  diltiazem (DILACOR XR) 180 MG 24 hr capsule Take 180 mg by mouth every morning.    Yes Historical Provider, MD  hydrOXYzine (ATARAX/VISTARIL) 10 MG tablet Take 1 tablet by mouth daily. 04/26/16  Yes Historical Provider, MD  lamoTRIgine (LAMICTAL) 100 MG tablet Take 100 mg by mouth at bedtime.  08/03/11  Yes Historical  Provider, MD  LATUDA 60 MG TABS Take 1 tablet by mouth at bedtime. 06/06/16  Yes Historical Provider, MD  metoprolol succinate (TOPROL-XL) 50 MG 24 hr tablet Take 1 tablet by mouth 2 (two) times daily. 05/22/16  Yes Historical Provider, MD  montelukast (SINGULAIR) 10 MG tablet Take 10 mg by mouth at bedtime.   Yes Historical Provider, MD  oxyCODONE-acetaminophen (PERCOCET) 5-325 MG tablet Take 1-2 tablets by mouth every 4 (four) hours as needed. 06/08/16  Yes Margarita Mail, PA-C  promethazine (PHENERGAN) 25 MG tablet Take 1 tablet (25 mg total) by mouth every 6 (six) hours as needed for nausea or  vomiting. 11/04/15  Yes Nona Dell, PA-C  albuterol [facility]) 108 (90 Base) MCG/ACT inhaler Inhale 2 puffs into the lungs every 6 (six) hours as needed. Shortness of breath/wheezing 01/20/15   Historical Provider, MD  cyanocobalamin (,VITAMIN B-12,) 1000 MCG/ML injection Inject 1,000 mcg into the skin every 30 (thirty) days.     Historical Provider, MD  DULoxetine (CYMBALTA) 30 MG capsule Take 30 mg by mouth daily.    Historical Provider, MD  EPINEPHrine (EPIPEN) 0.3 mg/0.3 mL SOAJ injection Inject 0.3 mg into the muscle once as needed (severe allergic reaction).    Historical Provider, MD  levalbuterol (XOPENEX) 0.31 MG/3ML nebulizer solution Inhale 3 mLs into the lungs every 6 (six) hours as needed. Shortness of breath/wheezing    Historical Provider, MD  naproxen (NAPROSYN) 500 MG tablet Take 1 tablet (500 mg total) by mouth 2 (two) times daily with a meal. 06/08/16   Margarita Mail, PA-C  traZODone (DESYREL) 100 MG tablet Take 100 mg by mouth at bedtime. 02/05/15   Historical Provider, MD    ALLERGY: Allergies  Allergen Reactions  . Bee Venom Anaphylaxis  . Reglan [Metoclopramide] Other (See Comments)    oculogyral crisis  . Shellfish Allergy Anaphylaxis  . Wheat Bran Other (See Comments)    Pt has Celiac disease - GLUTEN ALLERGY  . Other Hives    Adhesive Tape  . Sulfa Antibiotics Rash  . Tartrazine Rash  . Yellow Dye Rash    ROS: ROS  NEUROLOGIC EXAM: Awake, alert, oriented Memory and concentration grossly intact Speech fluent, appropriate CN grossly intact Motor exam: Upper Extremities Deltoid Bicep Tricep Grip  Right 5/5 5/5 5/5 5/5  Left 5/5 5/5 5/5 5/5   Lower Extremity IP Quad PF DF EHL  Right 5/5 5/5 5/5 5/5 5/5  Left 5/5 5/5 5/5 5/5 5/5   Sensation grossly intact to LT  IMGAING: MRI of the lumbar spine without contrast was reviewed. This demonstrates a normal lumbar lordosis. Primary finding is at L4 L5 where there is evidence of prior laminotomy  on the left. There does appear to be a left lateral recess stenosis with likely compression of the traversing left L5 nerve root. It is unclear whether this is recurrent/residual disc, or possibly epidural granulation tissue.   IMPRESSION: - 35 y.o. female with recurrent radiculopathy likely related to compression of the L5 root - She has a vague history of vWF, although review of her previous records I cannot find actual confirmatory diagnosis. She has previously undergone surgery with preop ddAVP without complication.  PLAN: - Proceed with left L4-5 laminotomy/microdiscectomy - Preop 98mg ddAVP - Will plan on observing overnight.  We have reviewed the risks, benefits, and alternatives to surgery with the patient in the office. All questions were answered, she is willing to proceed.

## 2016-06-22 NOTE — Progress Notes (Signed)
Darreld Mcleanrina RN spoke with Dr. Sherron AlesK Jackson with order to start DDAVP when pt is picked up for OR. VSS. Darreld Mcleanrina, RN notified CRNA that DDAVP started. Pt transported to OR.

## 2016-06-22 NOTE — Op Note (Signed)
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 Left L4-5 laminotomy and microdiscectomy for decompression of nerve root 2. Use of operating microscope  SURGEON: Dr. Consuella Lose, MD  ASSISTANT: Dr. Sherley Bounds, MD  ANESTHESIA: General Endotracheal  EBL: 100cc  SPECIMENS: None  DRAINS: None  COMPLICATIONS: None immediate  CONDITION: Hemodynamically stable to PACU  HISTORY: Kaitlyn Johnson is a 35 y.o. female presenting to the clinic with severe left-sided leg pain similar to that she experienced prior to her left L4-5 discectomy in 2016. MRI demonstrated recurrent disc fragment v epidural scar tissue, with likely compression of the left L5 root. She therefore elected to proceed with repeat surgical decompression.   PROCEDURE IN DETAIL: After informed consent was obtained and witnessed, the patient was brought to the operating room. After induction of general anesthesia, the patient was positioned on the operative table in the prone position with all pressure points meticulously padded. The skin of the low back was then prepped and draped in the usual sterile fashion.  After timeout was conducted, previous skin incision was opened sharply and Bovie electrocautery was used to dissect the subcutaneous tissue until the lumbodorsal fascia was identified. The fashion was then incised using Bovie electrocautery and the lamina at the L4 and L5 levels was identified. Previous L4-5 laminotomy defect was identified. Self-retaining retractor was then placed, and intraoperative x-ray was taken to confirm we were at the correct level.  Using a high-speed drill, the L4 and L5 laminotomy was extended as was the medial facetectomy. The ligamentum flavum was then identified underneath the L4 lamina and removed and the lateral edge of the thecal sac was identified. This was then traced down to identify the traversing nerve root. There was a  significant amount of scar tissue adherent to the nerve root in the ventral epidural space. The L5 pedicle was identified and the root was identified distally at the medial aspect of the pedicle.   Using multiple blunt dissectors, the ventral epidural space was dissected. The disc space was identified and incised, and discectomy was completed. Currettes were used to remove adherent fragments ventral to the L5 root above the disc space. Once decompression was completed, a dissector was easily passed from underneath L4 lamina along the ventral epidural space, and into the L5 foramen.  Having completed the decompression, hemostasis was then secured using a combination of morcellized Gelfoam and thrombin and bipolar electrocautery. The wound is irrigated with copious amounts of antibiotic saline irrigation.  Self-retaining retractor was then removed, and the wound is closed in layers using a combination of interrupted 0 Vicryl and 3-0 Vicryl stitches. The skin was closed using standard skin glue.  At the end of the case all sponge, needle, and instrument counts were correct. The patient was then transferred to the stretcher and taken to the postanesthesia care unit in stable hemodynamic condition.

## 2016-06-22 NOTE — Anesthesia Procedure Notes (Signed)
Procedure Name: Intubation Date/Time: 06/22/2016 12:19 PM Performed by: Lovie CholOCK, Nitzia Perren K Pre-anesthesia Checklist: Patient identified, Emergency Drugs available, Suction available and Patient being monitored Patient Re-evaluated:Patient Re-evaluated prior to inductionOxygen Delivery Method: Circle System Utilized Preoxygenation: Pre-oxygenation with 100% oxygen Intubation Type: IV induction Ventilation: Mask ventilation without difficulty Laryngoscope Size: Miller and 2 Grade View: Grade I Tube type: Oral Number of attempts: 1 Airway Equipment and Method: Stylet and Oral airway Placement Confirmation: ETT inserted through vocal cords under direct vision,  positive ETCO2 and breath sounds checked- equal and bilateral Secured at: 21 cm Tube secured with: Tape Dental Injury: Teeth and Oropharynx as per pre-operative assessment

## 2016-06-22 NOTE — Progress Notes (Signed)
Spoke with Dr. Conchita ParisNundkumar regarding labs to be drawn for patient. Confirmed CBC and BMET needed to be drawn. Pt to receive dose of DDAVP 30 min prior to surgery. Dr. Conchita ParisNundkumar to sign orders in computer.

## 2016-06-22 NOTE — Anesthesia Preprocedure Evaluation (Addendum)
Anesthesia Evaluation  Patient identified by MRN, date of birth, ID band Patient awake    Reviewed: Allergy & Precautions, H&P , NPO status , Patient's Chart, lab work & pertinent test results, reviewed documented beta blocker date and time   History of Anesthesia Complications Negative for: history of anesthetic complications  Airway Mallampati: II  TM Distance: >3 FB Neck ROM: full    Dental no notable dental hx. (+) Teeth Intact, Dental Advisory Given   Pulmonary asthma ,    Pulmonary exam normal breath sounds clear to auscultation       Cardiovascular hypertension, Pt. on home beta blockers and Pt. on medications  Rhythm:regular Rate:Normal     Neuro/Psych PSYCHIATRIC DISORDERS Anxiety Depression  Neuromuscular disease    GI/Hepatic hiatal hernia,   Endo/Other  Morbid obesity  Renal/GU      Musculoskeletal   Abdominal   Peds  Hematology Von Willibrand Rx'd with DDAVP   Anesthesia Other Findings Von Willebrand disease   POTS (postural orthostatic tachycardia syndrome)    Asthma, very rare inhaler use.... Chest clear   Anxiety    Depression   H/O hiatal hernia    Celiac and mesenteric artery injury   Sphincter of Oddi dysfunction    Shortness of breath dyspnea   History of kidney stones    Surgical History       Reproductive/Obstetrics                           Anesthesia Physical Anesthesia Plan  ASA: II  Anesthesia Plan: General   Post-op Pain Management:    Induction: Intravenous  Airway Management Planned: Oral ETT  Additional Equipment:   Intra-op Plan:   Post-operative Plan: Extubation in OR  Informed Consent: I have reviewed the patients History and Physical, chart, labs and discussed the procedure including the risks, benefits and alternatives for the proposed anesthesia with the patient or authorized representative who has indicated his/her understanding and  acceptance.   Dental Advisory Given and Dental advisory given  Plan Discussed with: CRNA and Surgeon  Anesthesia Plan Comments: (  Discussed general anesthesia, including possible nausea, instrumentation of airway, sore throat,pulmonary aspiration, etc. I asked if the were any outstanding questions, or  concerns before we proceeded. )        Anesthesia Quick Evaluation

## 2016-06-22 NOTE — Transfer of Care (Signed)
Immediate Anesthesia Transfer of Care Note  Patient: Kaitlyn Johnson  Procedure(s) Performed: Procedure(s): MICRODISCECTOMY LEFT LUMBAR FOUR-FIVE (Left)  Patient Location: PACU  Anesthesia Type:General  Level of Consciousness: awake, oriented and patient cooperative  Airway & Oxygen Therapy: Patient Spontanous Breathing and Patient connected to nasal cannula oxygen  Post-op Assessment: Report given to RN and Post -op Vital signs reviewed and stable  Post vital signs: Reviewed  Last Vitals:  Vitals:   06/22/16 1048 06/22/16 1445  BP: 120/78 119/78  Pulse: 80 77  Resp:  19  Temp:  36.4 C    Last Pain:  Vitals:   06/22/16 1445  TempSrc:   PainSc: 0-No pain         Complications: No apparent anesthesia complications

## 2016-06-23 DIAGNOSIS — Z882 Allergy status to sulfonamides status: Secondary | ICD-10-CM | POA: Diagnosis not present

## 2016-06-23 DIAGNOSIS — D680 Von Willebrand's disease: Secondary | ICD-10-CM | POA: Diagnosis not present

## 2016-06-23 DIAGNOSIS — D649 Anemia, unspecified: Secondary | ICD-10-CM | POA: Diagnosis not present

## 2016-06-23 DIAGNOSIS — F329 Major depressive disorder, single episode, unspecified: Secondary | ICD-10-CM | POA: Diagnosis not present

## 2016-06-23 DIAGNOSIS — K449 Diaphragmatic hernia without obstruction or gangrene: Secondary | ICD-10-CM | POA: Diagnosis not present

## 2016-06-23 DIAGNOSIS — Z888 Allergy status to other drugs, medicaments and biological substances status: Secondary | ICD-10-CM | POA: Diagnosis not present

## 2016-06-23 DIAGNOSIS — Z87442 Personal history of urinary calculi: Secondary | ICD-10-CM | POA: Diagnosis not present

## 2016-06-23 DIAGNOSIS — Z9049 Acquired absence of other specified parts of digestive tract: Secondary | ICD-10-CM | POA: Diagnosis not present

## 2016-06-23 DIAGNOSIS — F419 Anxiety disorder, unspecified: Secondary | ICD-10-CM | POA: Diagnosis not present

## 2016-06-23 DIAGNOSIS — M5126 Other intervertebral disc displacement, lumbar region: Secondary | ICD-10-CM | POA: Diagnosis not present

## 2016-06-23 DIAGNOSIS — Z79899 Other long term (current) drug therapy: Secondary | ICD-10-CM | POA: Diagnosis not present

## 2016-06-23 DIAGNOSIS — Z9102 Food additives allergy status: Secondary | ICD-10-CM | POA: Diagnosis not present

## 2016-06-23 DIAGNOSIS — E249 Cushing's syndrome, unspecified: Secondary | ICD-10-CM | POA: Diagnosis not present

## 2016-06-23 DIAGNOSIS — J45909 Unspecified asthma, uncomplicated: Secondary | ICD-10-CM | POA: Diagnosis not present

## 2016-06-23 DIAGNOSIS — Z91030 Bee allergy status: Secondary | ICD-10-CM | POA: Diagnosis not present

## 2016-06-23 DIAGNOSIS — Z90710 Acquired absence of both cervix and uterus: Secondary | ICD-10-CM | POA: Diagnosis not present

## 2016-06-23 MED ORDER — CYCLOBENZAPRINE HCL 10 MG PO TABS: 10.0000 mg | ORAL_TABLET | Freq: Three times a day (TID) | ORAL | 0 refills | Status: DC | PRN

## 2016-06-23 MED ORDER — OXYCODONE-ACETAMINOPHEN 5-325 MG PO TABS: 1.0000 | ORAL_TABLET | Freq: Four times a day (QID) | ORAL | 0 refills | Status: DC | PRN

## 2016-06-23 NOTE — Discharge Summary (Signed)
Physician Discharge Summary  Patient ID: Kaitlyn Johnson MRN: [id number] DOB/AGE: [birth year] 35 y.o.  Admit date: 06/22/2016 Discharge date: 06/23/2016  Admission Diagnoses: Recurrent disc herniation    Discharge Diagnoses: Same   Discharged Condition: good  Hospital Course: The patient was admitted on 06/22/2016 and taken to the operating room where the patient underwent redo lumbar microdiscectomy. The patient tolerated the procedure well and was taken to the recovery room and then to the floor in stable condition. The hospital course was routine. There were no complications. The wound remained clean dry and intact. Pt had appropriate back soreness. No complaints of leg pain or new N/T/W. The patient remained afebrile with stable vital signs, and tolerated a regular diet. The patient continued to increase activities, and pain was well controlled with oral pain medications.   Consults: None  Significant Diagnostic Studies:  Results for orders placed or performed during the hospital encounter of 06/22/16  Surgical pcr screen  Result Value Ref Range   MRSA, PCR NEGATIVE NEGATIVE   Staphylococcus aureus NEGATIVE NEGATIVE  Basic metabolic panel  Result Value Ref Range   Sodium 138 135 - 145 mmol/L   Potassium 4.0 3.5 - 5.1 mmol/L   Chloride 108 101 - 111 mmol/L   CO2 22 22 - 32 mmol/L   Glucose, Bld 88 65 - 99 mg/dL   BUN 14 6 - 20 mg/dL   Creatinine, Ser 0.85 0.44 - 1.00 mg/dL   Calcium 9.0 8.9 - 10.3 mg/dL   GFR calc non Af Amer >60 >60 mL/min   GFR calc Af Amer >60 >60 mL/min   Anion gap 8 5 - 15  CBC  Result Value Ref Range   WBC 7.5 4.0 - 10.5 K/uL   RBC 3.90 3.87 - 5.11 MIL/uL   Hemoglobin 12.1 12.0 - 15.0 g/dL   HCT 36.6 36.0 - 46.0 %   MCV 93.8 78.0 - 100.0 fL   MCH 31.0 26.0 - 34.0 pg   MCHC 33.1 30.0 - 36.0 g/dL   RDW 11.9 11.5 - 15.5 %   Platelets 308 150 - 400 K/uL    Mr Lumbar Spine Wo Contrast  Result Date: 06/08/2016 CLINICAL DATA:  Felt pop and  back with acute onset of low back pain and numbness into the left lower extremity after bending over five days ago. Personal history of L4-5 laminectomy. Symptoms feel similar to her previous preop radiculitis. EXAM: MRI LUMBAR SPINE WITHOUT CONTRAST TECHNIQUE: Multiplanar, multisequence MR imaging of the lumbar spine was performed. No intravenous contrast was administered. COMPARISON:  MRI of the cervical spine 06/23/2015. FINDINGS: Segmentation: 5 non rib-bearing lumbar type vertebral bodies are present. Alignment:  AP alignment is anatomic. Vertebrae:  Marrow signal and vertebral body heights are normal. Conus medullaris: Extends to the T12 level and appears normal. Paraspinal and other soft tissues: Limited imaging of the abdomen is unremarkable. There is no significant adenopathy. Disc levels: The disc levels at L3-4 and above are normal. L4-5: Left laminectomy is noted. There is slight displacement of the left L5 nerve roots in the subarticular space. This is markedly improved following resection of previous disc protrusion. Soft tissue occupying the left subdural space may be related to granulation tissue. No significant foraminal narrowing is present. L5-S1:  Negative. IMPRESSION: 1. Postoperative changes on the left at L4-5. 2. Soft tissue within the left epidural space likely represents granulation tissue. A small recurrent disc protrusion is not excluded. Postcontrast images are helpful following surgery to differentiate between recurrent  disc material and postoperative granulation tissue. 3. No significant disease at L3-4 and above or at L5-S1. Electronically Signed   By: San Morelle M.D.   On: 06/08/2016 12:27   Dg Lumbar Spine 1 View  Result Date: 06/22/2016 CLINICAL DATA:  L4-5 microdiscectomy EXAM: LUMBAR SPINE - 1 VIEW COMPARISON:  06/08/2016 FINDINGS: Surgical retractors in instruments are noted at the L4-5 level. Numbering nomenclature similar to that utilized on prior MRI  examination. IMPRESSION: Intraoperative localization at L4-5. Electronically Signed   By: Inez Catalina M.D.   On: 06/22/2016 14:36    Antibiotics:  Anti-infectives    Start     Dose/Rate Route Frequency Ordered Stop   06/22/16 2000  ceFAZolin (ANCEF) IVPB 2g/100 mL premix     2 g 200 mL/hr over 30 Minutes Intravenous Every 8 hours 06/22/16 1657 06/23/16 0301   06/22/16 1200  bacitracin 50,000 Units in sodium chloride irrigation 0.9 % 500 mL irrigation  Status:  Discontinued       As needed 06/22/16 1323 06/22/16 1438   06/22/16 1045  ceFAZolin (ANCEF) IVPB 2g/100 mL premix     2 g 200 mL/hr over 30 Minutes Intravenous On call to O.R. 06/22/16 1035 06/22/16 1235   06/22/16 1040  ceFAZolin (ANCEF) 2-4 GM/100ML-% IVPB    Comments:  Rosenberger, Meredit: cabinet override      06/22/16 1040 06/22/16 2244      Discharge Exam: Blood pressure 139/90, pulse 72, temperature 98.4 F (36.9 C), temperature source Oral, resp. rate 18, height 5' 9"  (1.753 m), weight 100.7 kg (222 lb), SpO2 97 %. Neurologic: Grossly normal Dressing dry  Discharge Medications:     Medication List    TAKE these medications   AMBIEN CR 12.5 MG CR tablet Generic drug:  zolpidem Take 12.5 mg by mouth at bedtime.   clonazePAM 1 MG tablet Commonly known as:  KLONOPIN Take 1 tablet (1 mg total) by mouth 2 (two) times daily as needed for anxiety.   cyanocobalamin 1000 MCG/ML injection Commonly known as:  (VITAMIN B-12) Inject 1,000 mcg into the skin every 30 (thirty) days.   cyclobenzaprine 10 MG tablet Commonly known as:  FLEXERIL Take 1 tablet (10 mg total) by mouth 3 (three) times daily as needed for muscle spasms. What changed:  when to take this   diltiazem 180 MG 24 hr capsule Commonly known as:  CARDIZEM CD Take 1 capsule by mouth every morning.   diltiazem 180 MG 24 hr capsule Commonly known as:  DILACOR XR Take 180 mg by mouth every morning.   DULoxetine 30 MG capsule Commonly known as:   CYMBALTA Take 30 mg by mouth daily.   EPIPEN 0.3 mg/0.3 mL Soaj injection Generic drug:  EPINEPHrine Inject 0.3 mg into the muscle once as needed (severe allergic reaction).   hydrOXYzine 10 MG tablet Commonly known as:  ATARAX/VISTARIL Take 1 tablet by mouth daily.   LAMICTAL 100 MG tablet Generic drug:  lamoTRIgine Take 100 mg by mouth at bedtime.   LATUDA 60 MG Tabs Generic drug:  Lurasidone HCl Take 1 tablet by mouth at bedtime.   metoprolol succinate 50 MG 24 hr tablet Commonly known as:  TOPROL-XL Take 1 tablet by mouth 2 (two) times daily.   montelukast 10 MG tablet Commonly known as:  SINGULAIR Take 10 mg by mouth at bedtime.   naproxen 500 MG tablet Commonly known as:  NAPROSYN Take 1 tablet (500 mg total) by mouth 2 (two) times daily with a meal.  oxyCODONE-acetaminophen 5-325 MG tablet Commonly known as:  PERCOCET Take 1-2 tablets by mouth every 6 (six) hours as needed for moderate pain. What changed:  when to take this  reasons to take this   PROAIR HFA 108 (90 Base) MCG/ACT inhaler Generic drug:  albuterol Inhale 2 puffs into the lungs every 6 (six) hours as needed. Shortness of breath/wheezing   promethazine 25 MG tablet Commonly known as:  PHENERGAN Take 1 tablet (25 mg total) by mouth every 6 (six) hours as needed for nausea or vomiting.   traZODone 100 MG tablet Commonly known as:  DESYREL Take 100 mg by mouth at bedtime.   XOPENEX 0.31 MG/3ML nebulizer solution Generic drug:  levalbuterol Inhale 3 mLs into the lungs every 6 (six) hours as needed. Shortness of breath/wheezing       Disposition: Home   Final Dx: Redo microdiscectomy  Discharge Instructions     Remove dressing in 72 hours    Complete by:  As directed   Call MD for:  difficulty breathing, headache or visual disturbances    Complete by:  As directed   Call MD for:  persistant nausea and vomiting    Complete by:  As directed   Call MD for:  redness, tenderness, or signs  of infection (pain, swelling, redness, odor or green/yellow discharge around incision site)    Complete by:  As directed   Call MD for:  severe uncontrolled pain    Complete by:  As directed   Call MD for:  temperature >100.4    Complete by:  As directed   Diet - low sodium heart healthy    Complete by:  As directed   Discharge instructions    Complete by:  As directed   No lifting, no bending or twisting, no driving, may shower   Increase activity slowly    Complete by:  As directed         Signed: Myleen Brailsford S 06/23/2016, 9:55 AM

## 2016-06-23 NOTE — Progress Notes (Signed)
Pt doing well. Pt and family given D/C instructions with Rx's, verbal understanding was provided. Pt's incision is clean and dry with no sign of infection. Pt's IV was removed prior to D/C. Pt D/C'd home via wheelchair @ 1020 per MD order. Pt is stable @ D/C and has no other needs at this time. Annalyssa Thune, RN  

## 2016-06-25 ENCOUNTER — Encounter (HOSPITAL_COMMUNITY): Payer: Self-pay | Admitting: Neurosurgery

## 2016-06-25 NOTE — Anesthesia Postprocedure Evaluation (Signed)
Anesthesia Post Note  Patient: Kaitlyn Johnson  Procedure(s) Performed: Procedure(s) (LRB): MICRODISCECTOMY LEFT LUMBAR FOUR-FIVE (Left)  Patient location during evaluation: PACU Anesthesia Type: General Level of consciousness: sedated Pain management: satisfactory to patient Vital Signs Assessment: post-procedure vital signs reviewed and stable Respiratory status: spontaneous breathing Cardiovascular status: stable Anesthetic complications: no    Last Vitals:  Vitals:   06/22/16 2352 06/23/16 0430  BP: (!) 145/95 139/90  Pulse: 75 72  Resp: 20 18  Temp: 36.9 C 36.9 C    Last Pain:  Vitals:   06/23/16 0830  TempSrc:   PainSc: 5                  Zoya Sprecher EDWARD

## 2016-07-13 DIAGNOSIS — F411 Generalized anxiety disorder: Secondary | ICD-10-CM | POA: Diagnosis not present

## 2016-07-13 DIAGNOSIS — F605 Obsessive-compulsive personality disorder: Secondary | ICD-10-CM | POA: Diagnosis not present

## 2016-07-13 DIAGNOSIS — F331 Major depressive disorder, recurrent, moderate: Secondary | ICD-10-CM | POA: Diagnosis not present

## 2016-07-26 DIAGNOSIS — M5416 Radiculopathy, lumbar region: Secondary | ICD-10-CM | POA: Diagnosis not present

## 2016-07-27 DIAGNOSIS — M5416 Radiculopathy, lumbar region: Secondary | ICD-10-CM | POA: Diagnosis not present

## 2016-07-27 DIAGNOSIS — M5126 Other intervertebral disc displacement, lumbar region: Secondary | ICD-10-CM | POA: Diagnosis not present

## 2016-07-30 DIAGNOSIS — M47816 Spondylosis without myelopathy or radiculopathy, lumbar region: Secondary | ICD-10-CM | POA: Insufficient documentation

## 2016-08-06 DIAGNOSIS — M1288 Other specific arthropathies, not elsewhere classified, other specified site: Secondary | ICD-10-CM | POA: Diagnosis not present

## 2016-08-16 DIAGNOSIS — M1288 Other specific arthropathies, not elsewhere classified, other specified site: Secondary | ICD-10-CM | POA: Diagnosis not present

## 2016-08-21 DIAGNOSIS — F411 Generalized anxiety disorder: Secondary | ICD-10-CM | POA: Diagnosis not present

## 2016-08-21 DIAGNOSIS — F331 Major depressive disorder, recurrent, moderate: Secondary | ICD-10-CM | POA: Diagnosis not present

## 2016-08-21 DIAGNOSIS — F605 Obsessive-compulsive personality disorder: Secondary | ICD-10-CM | POA: Diagnosis not present

## 2016-10-09 DIAGNOSIS — Z9889 Other specified postprocedural states: Secondary | ICD-10-CM | POA: Diagnosis not present

## 2016-10-09 DIAGNOSIS — Z981 Arthrodesis status: Secondary | ICD-10-CM | POA: Insufficient documentation

## 2016-10-09 DIAGNOSIS — M5440 Lumbago with sciatica, unspecified side: Secondary | ICD-10-CM | POA: Diagnosis not present

## 2016-10-09 DIAGNOSIS — Q7649 Other congenital malformations of spine, not associated with scoliosis: Secondary | ICD-10-CM | POA: Insufficient documentation

## 2016-10-09 DIAGNOSIS — Q763 Congenital scoliosis due to congenital bony malformation: Secondary | ICD-10-CM | POA: Diagnosis not present

## 2016-10-09 DIAGNOSIS — M4726 Other spondylosis with radiculopathy, lumbar region: Secondary | ICD-10-CM | POA: Diagnosis not present

## 2016-10-10 DIAGNOSIS — M4726 Other spondylosis with radiculopathy, lumbar region: Secondary | ICD-10-CM | POA: Diagnosis not present

## 2016-10-17 DIAGNOSIS — M5126 Other intervertebral disc displacement, lumbar region: Secondary | ICD-10-CM | POA: Diagnosis not present

## 2016-10-17 DIAGNOSIS — M4726 Other spondylosis with radiculopathy, lumbar region: Secondary | ICD-10-CM | POA: Diagnosis not present

## 2016-11-04 DIAGNOSIS — J069 Acute upper respiratory infection, unspecified: Secondary | ICD-10-CM | POA: Diagnosis not present

## 2016-11-04 DIAGNOSIS — R05 Cough: Secondary | ICD-10-CM | POA: Diagnosis not present

## 2016-11-04 DIAGNOSIS — J029 Acute pharyngitis, unspecified: Secondary | ICD-10-CM | POA: Diagnosis not present

## 2016-11-06 DIAGNOSIS — J069 Acute upper respiratory infection, unspecified: Secondary | ICD-10-CM | POA: Diagnosis not present

## 2016-11-20 DIAGNOSIS — B9689 Other specified bacterial agents as the cause of diseases classified elsewhere: Secondary | ICD-10-CM | POA: Diagnosis not present

## 2016-11-20 DIAGNOSIS — J019 Acute sinusitis, unspecified: Secondary | ICD-10-CM | POA: Diagnosis not present

## 2016-11-22 DIAGNOSIS — F411 Generalized anxiety disorder: Secondary | ICD-10-CM | POA: Diagnosis not present

## 2016-11-27 DIAGNOSIS — M4726 Other spondylosis with radiculopathy, lumbar region: Secondary | ICD-10-CM | POA: Diagnosis not present

## 2016-12-06 DIAGNOSIS — Q763 Congenital scoliosis due to congenital bony malformation: Secondary | ICD-10-CM | POA: Diagnosis not present

## 2016-12-06 DIAGNOSIS — M47816 Spondylosis without myelopathy or radiculopathy, lumbar region: Secondary | ICD-10-CM | POA: Diagnosis not present

## 2016-12-20 DIAGNOSIS — Q763 Congenital scoliosis due to congenital bony malformation: Secondary | ICD-10-CM | POA: Diagnosis not present

## 2016-12-20 DIAGNOSIS — M47816 Spondylosis without myelopathy or radiculopathy, lumbar region: Secondary | ICD-10-CM | POA: Diagnosis not present

## 2016-12-31 ENCOUNTER — Emergency Department (HOSPITAL_BASED_OUTPATIENT_CLINIC_OR_DEPARTMENT_OTHER): Payer: BLUE CROSS/BLUE SHIELD

## 2016-12-31 ENCOUNTER — Emergency Department (HOSPITAL_BASED_OUTPATIENT_CLINIC_OR_DEPARTMENT_OTHER)
Admission: EM | Admit: 2016-12-31 | Discharge: 2016-12-31 | Disposition: A | Discharge status: Home / Self Care | Payer: BLUE CROSS/BLUE SHIELD | Attending: Emergency Medicine | Admitting: Emergency Medicine

## 2016-12-31 ENCOUNTER — Encounter (HOSPITAL_BASED_OUTPATIENT_CLINIC_OR_DEPARTMENT_OTHER): Payer: Self-pay

## 2016-12-31 DIAGNOSIS — R1013 Epigastric pain: Secondary | ICD-10-CM

## 2016-12-31 DIAGNOSIS — R1012 Left upper quadrant pain: Secondary | ICD-10-CM | POA: Diagnosis not present

## 2016-12-31 DIAGNOSIS — R1011 Right upper quadrant pain: Secondary | ICD-10-CM | POA: Diagnosis not present

## 2016-12-31 DIAGNOSIS — R197 Diarrhea, unspecified: Secondary | ICD-10-CM | POA: Diagnosis not present

## 2016-12-31 DIAGNOSIS — J45909 Unspecified asthma, uncomplicated: Secondary | ICD-10-CM | POA: Insufficient documentation

## 2016-12-31 DIAGNOSIS — R110 Nausea: Secondary | ICD-10-CM | POA: Insufficient documentation

## 2016-12-31 LAB — CBC WITH DIFFERENTIAL/PLATELET
Basophils Absolute: 0 10*3/uL (ref 0.0–0.1)
Basophils Relative: 0 %
EOS ABS: 0.1 10*3/uL (ref 0.0–0.7)
Eosinophils Relative: 1 %
HEMATOCRIT: 37.1 % (ref 36.0–46.0)
HEMOGLOBIN: 12.4 g/dL (ref 12.0–15.0)
LYMPHS ABS: 2.3 10*3/uL (ref 0.7–4.0)
LYMPHS PCT: 22 %
MCH: 31.4 pg (ref 26.0–34.0)
MCHC: 33.4 g/dL (ref 30.0–36.0)
MCV: 93.9 fL (ref 78.0–100.0)
MONOS PCT: 7 %
Monocytes Absolute: 0.7 10*3/uL (ref 0.1–1.0)
NEUTROS ABS: 7.1 10*3/uL (ref 1.7–7.7)
NEUTROS PCT: 70 %
Platelets: 265 10*3/uL (ref 150–400)
RBC: 3.95 MIL/uL (ref 3.87–5.11)
RDW: 12.9 % (ref 11.5–15.5)
WBC: 10.2 10*3/uL (ref 4.0–10.5)

## 2016-12-31 LAB — COMPREHENSIVE METABOLIC PANEL
ALT: 25 U/L (ref 14–54)
ANION GAP: 8 (ref 5–15)
AST: 23 U/L (ref 15–41)
Albumin: 4.2 g/dL (ref 3.5–5.0)
Alkaline Phosphatase: 64 U/L (ref 38–126)
BUN: 15 mg/dL (ref 6–20)
CHLORIDE: 102 mmol/L (ref 101–111)
CO2: 25 mmol/L (ref 22–32)
Calcium: 9.2 mg/dL (ref 8.9–10.3)
Creatinine, Ser: 0.79 mg/dL (ref 0.44–1.00)
Glucose, Bld: 102 mg/dL — ABNORMAL HIGH (ref 65–99)
POTASSIUM: 3.8 mmol/L (ref 3.5–5.1)
SODIUM: 135 mmol/L (ref 135–145)
Total Bilirubin: 0.1 mg/dL — ABNORMAL LOW (ref 0.3–1.2)
Total Protein: 7.6 g/dL (ref 6.5–8.1)

## 2016-12-31 LAB — URINALYSIS, ROUTINE W REFLEX MICROSCOPIC
Bilirubin Urine: NEGATIVE
GLUCOSE, UA: NEGATIVE mg/dL
HGB URINE DIPSTICK: NEGATIVE
KETONES UR: NEGATIVE mg/dL
Leukocytes, UA: NEGATIVE
Nitrite: NEGATIVE
PROTEIN: NEGATIVE mg/dL
Specific Gravity, Urine: 1.004 — ABNORMAL LOW (ref 1.005–1.030)
pH: 6.5 (ref 5.0–8.0)

## 2016-12-31 LAB — LIPASE, BLOOD: LIPASE: 35 U/L (ref 11–51)

## 2016-12-31 MED ORDER — SODIUM CHLORIDE 0.9 % IV BOLUS (SEPSIS)
1000.0000 mL | Freq: Once | INTRAVENOUS | Status: AC
  Administered 2016-12-31: 1000 mL via INTRAVENOUS

## 2016-12-31 MED ORDER — OMEPRAZOLE 20 MG PO CPDR: 20.0000 mg | DELAYED_RELEASE_CAPSULE | Freq: Every day | ORAL | 0 refills | Status: DC

## 2016-12-31 MED ORDER — ONDANSETRON HCL 4 MG/2ML IJ SOLN
4.0000 mg | Freq: Once | INTRAMUSCULAR | Status: AC
  Administered 2016-12-31: 4 mg via INTRAVENOUS
  Filled 2016-12-31: qty 2

## 2016-12-31 MED ORDER — HYDROMORPHONE HCL 1 MG/ML IJ SOLN
1.0000 mg | Freq: Once | INTRAMUSCULAR | Status: AC
  Administered 2016-12-31: 1 mg via INTRAVENOUS
  Filled 2016-12-31: qty 1

## 2016-12-31 MED ORDER — IOPAMIDOL (ISOVUE-300) INJECTION 61%
100.0000 mL | Freq: Once | INTRAVENOUS | Status: AC | PRN
  Administered 2016-12-31: 100 mL via INTRAVENOUS

## 2016-12-31 MED ORDER — SUCRALFATE 1 GM/10ML PO SUSP: 1.0000 g | Freq: Three times a day (TID) | ORAL | 0 refills | Status: DC

## 2016-12-31 MED ORDER — MORPHINE SULFATE (PF) 4 MG/ML IV SOLN
4.0000 mg | Freq: Once | INTRAVENOUS | Status: AC
  Administered 2016-12-31: 4 mg via INTRAVENOUS
  Filled 2016-12-31: qty 1

## 2016-12-31 NOTE — ED Triage Notes (Signed)
C/o abd pain, nausea x today-reports feels like pancreatitis pain-NAD-steady gait

## 2016-12-31 NOTE — ED Notes (Signed)
Pt reports last bought of pancreatitis was last January. Per GI physician was told to come here to rule out current pancreatitis.

## 2016-12-31 NOTE — ED Provider Notes (Signed)
[facility] Provider Note   CSN: [id number] Arrival date & time: 12/31/16  1733   By signing my name below, I, Soijett Blue, attest that this documentation has been prepared under the direction and in the presence of Burnard Hawthorne, PA-C Electronically Signed: Soijett Blue, ED Scribe. 12/31/16. 9:42 PM.  History   Chief Complaint Chief Complaint  Patient presents with  . Abdominal Pain    HPI Kaitlyn Johnson is a 36 y.o. female with a PMHx of pancreatitis, cushing's syndrome, sphincter of Oddi dysfunction, who presents to the Emergency Department complaining of constant, sharp, splintering, LUQ abdominal pain onset 3 hours ago. Pt reports associated nausea, diarrhea, and decreased PO intake. Pt has not tried any medications for the relief of her symptoms. She reports that her LUQ pain radiates to her back. Pt notes that she has a hx of pancreatitis and was informed to come into the ED for evaluation by her GI specialist. Pt last pancreatitis flare was January or February of 2017. States this feels very similar. She denies vomiting, fever, chest pain, shortness of breath urinary symptoms, vaginal symptoms, change in bowel habits and any other symptoms. She states that she has had a cholecystectomy and denies hx of DM or HTN. Pt reports that she occasionally consumes ETOH.    The history is provided by the patient and a parent. No language interpreter was used.    Past Medical History:  Diagnosis Date  . Anemia 11/26/2011  . Anxiety   . Asthma   . Celiac and mesenteric artery injury   . Cushing's syndrome ([place])    06/21/16- "in remission"  . Depression   . H/O hiatal hernia   . History of kidney stones   . POTS (postural orthostatic tachycardia syndrome)   . Shortness of breath dyspnea    with exertertion  . Sphincter of Oddi dysfunction   . Von Willebrand disease ([place])    bruising easy    Patient Active Problem List   Diagnosis Date Noted  . HNP  (herniated nucleus pulposus), lumbar 06/22/2016  . Nausea with vomiting 11/30/2013  . Abdominal pain, epigastric 11/30/2013  . Dehydration 11/30/2013  . Acute pancreatitis 11/30/2013  . Pancreatitis 11/29/2013  . Gastroenteritis due to norovirus 11/26/2013  . Hypomagnesemia 11/25/2013  . Nausea vomiting and diarrhea 11/23/2013  . Anemia of chronic disease 11/23/2013  . Hypokalemia 11/23/2013  . Hyponatremia 11/23/2013  . Nausea and vomiting 11/23/2013  . Iatrogenic Cushing's syndrome ([place]) 10/02/2013  . Endometriosis 09/01/2013  . Celiac disease 09/01/2013  . Asthma, chronic 09/01/2013  . 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 POTS (postural orthostatic tachycardia syndrome) 11/26/2011    Past Surgical History:  Procedure Laterality Date  . ABDOMINAL HYSTERECTOMY  08/2010  . ANTERIOR CERVICAL DECOMP/DISCECTOMY FUSION N/A 03/31/2013   Procedure: ANTERIOR CERVICAL DECOMPRESSION/DISCECTOMY FUSION 1 LEVEL Cervical five-six;  Surgeon: Faythe Ghee, MD;  Location: [facility];  Service: Neurosurgery;  Laterality: N/A;  . Cholangio-Pancreatography with Spintectorotomy + Stent  07/16/2012   01/15/14  . CHOLECYSTECTOMY    . LAPAROSCOPIC ENDOMETRIOSIS FULGURATION    . LUMBAR LAMINECTOMY    . LUMBAR LAMINECTOMY/DECOMPRESSION MICRODISCECTOMY Left 06/22/2016   Procedure: MICRODISCECTOMY LEFT LUMBAR FOUR-FIVE;  Surgeon: Consuella Lose, MD;  Location: [facility];  Service: Neurosurgery;  Laterality: Left;  . ROUX-EN-Y PROCEDURE  04/1999    OB History    No data available       Home Medications    Prior to Admission  medications   Medication Sig Start Date End Date Taking? Authorizing Provider  albuterol (PROAIR HFA) 108 (90 Base) MCG/ACT inhaler Inhale 2 puffs into the lungs every 6 (six) hours as needed. Shortness of breath/wheezing 01/20/15   Historical Provider, MD  AMBIEN CR 12.5 MG CR tablet Take 12.5 mg by mouth at bedtime. 10/31/15   Historical Provider, MD    clonazePAM (KLONOPIN) 1 MG tablet Take 1 tablet (1 mg total) by mouth 2 (two) times daily as needed for anxiety. 12/07/13   Theodis Blaze, MD  cyanocobalamin (,VITAMIN B-12,) 1000 MCG/ML injection Inject 1,000 mcg into the skin every 30 (thirty) days.     Historical Provider, MD  cyclobenzaprine (FLEXERIL) 10 MG tablet Take 1 tablet (10 mg total) by mouth 3 (three) times daily as needed for muscle spasms. 06/23/16   Eustace Moore, MD  diltiazem (CARDIZEM CD) 180 MG 24 hr capsule Take 1 capsule by mouth every morning.  05/29/16   Historical Provider, MD  diltiazem (DILACOR XR) 180 MG 24 hr capsule Take 180 mg by mouth every morning.     Historical Provider, MD  DULoxetine (CYMBALTA) 30 MG capsule Take 30 mg by mouth daily.    Historical Provider, MD  EPINEPHrine (EPIPEN) 0.3 mg/0.3 mL SOAJ injection Inject 0.3 mg into the muscle once as needed (severe allergic reaction).    Historical Provider, MD  hydrOXYzine (ATARAX/VISTARIL) 10 MG tablet Take 1 tablet by mouth daily. 04/26/16   Historical Provider, MD  lamoTRIgine (LAMICTAL) 100 MG tablet Take 100 mg by mouth at bedtime.  08/03/11   Historical Provider, MD  LATUDA 60 MG TABS Take 1 tablet by mouth at bedtime. 06/06/16   Historical Provider, MD  levalbuterol (XOPENEX) 0.31 MG/3ML nebulizer solution Inhale 3 mLs into the lungs every 6 (six) hours as needed. Shortness of breath/wheezing    Historical Provider, MD  metoprolol succinate (TOPROL-XL) 50 MG 24 hr tablet Take 1 tablet by mouth 2 (two) times daily. 05/22/16   Historical Provider, MD  montelukast (SINGULAIR) 10 MG tablet Take 10 mg by mouth at bedtime.    Historical Provider, MD  naproxen (NAPROSYN) 500 MG tablet Take 1 tablet (500 mg total) by mouth 2 (two) times daily with a meal. 06/08/16   Margarita Mail, PA-C  oxyCODONE-acetaminophen (PERCOCET) 5-325 MG tablet Take 1-2 tablets by mouth every 6 (six) hours as needed for moderate pain. 06/23/16   Eustace Moore, MD  promethazine (PHENERGAN) 25 MG  tablet Take 1 tablet (25 mg total) by mouth every 6 (six) hours as needed for nausea or vomiting. 11/04/15   Nona Dell, PA-C  traZODone (DESYREL) 100 MG tablet Take 100 mg by mouth at bedtime. 02/05/15   Historical Provider, MD    Family History Family History  Problem Relation Age of Onset  . Hypertension Mother     Social History Social History  Substance Use Topics  . Smoking status: Never Smoker  . Smokeless tobacco: Never Used  . Alcohol use 3.0 oz/week    5 Glasses of wine per week     Allergies   Bee venom; Reglan [metoclopramide]; Shellfish allergy; Wheat bran; Other; Sulfa antibiotics; Tartrazine; and Yellow dye   Review of Systems Review of Systems  Constitutional: Positive for appetite change. Negative for fever.  Gastrointestinal: Positive for abdominal pain (LUQ), diarrhea and nausea. Negative for vomiting.     Physical Exam Updated Vital Signs BP 113/75 (BP Location: Left Arm)   Pulse 83   Temp 98  F (36.7 C) (Oral)   Resp 18   SpO2 100%   Physical Exam  Constitutional: She is oriented to person, place, and time. She appears well-developed and well-nourished. No distress.  HENT:  Head: Normocephalic and atraumatic.  Eyes: EOM are normal.  Neck: Neck supple.  Cardiovascular: Normal rate, regular rhythm and normal heart sounds.  Exam reveals no gallop and no friction rub.   No murmur heard. Pulmonary/Chest: Effort normal and breath sounds normal. No respiratory distress. She has no wheezes. She has no rales.  Abdominal: Soft. Bowel sounds are normal. She exhibits no distension. There is tenderness in the epigastric area and left upper quadrant. There is no rigidity, no rebound, no guarding and no CVA tenderness.  LUQ and epigastric TTP.  Musculoskeletal: Normal range of motion.  Neurological: She is alert and oriented to person, place, and time.  Skin: Skin is warm and dry.  Psychiatric: She has a normal mood and affect. Her behavior is  normal.  Nursing note and vitals reviewed.    ED Treatments / Results  DIAGNOSTIC STUDIES: Oxygen Saturation is 100% on RA, nl by my interpretation.    COORDINATION OF CARE: 7:30 PM Discussed treatment plan with pt at bedside which includes labs, UA, morphine, CT abdomen pelvis, and pt agreed to plan.    Labs (all labs ordered are listed, but only abnormal results are displayed) Labs Reviewed  URINALYSIS, ROUTINE W REFLEX MICROSCOPIC - Abnormal; Notable for the following:       Result Value   Specific Gravity, Urine 1.004 (*)    All other components within normal limits  COMPREHENSIVE METABOLIC PANEL - Abnormal; Notable for the following:    Glucose, Bld 102 (*)    Total Bilirubin 0.1 (*)    All other components within normal limits  CBC WITH DIFFERENTIAL/PLATELET  LIPASE, BLOOD    Radiology Ct Abdomen Pelvis W Contrast  Result Date: 12/31/2016 CLINICAL DATA:  Left upper quadrant pain, nausea EXAM: CT ABDOMEN AND PELVIS WITH CONTRAST TECHNIQUE: Multidetector CT imaging of the abdomen and pelvis was performed using the standard protocol following bolus administration of intravenous contrast. CONTRAST:  173m ISOVUE-300 IOPAMIDOL (ISOVUE-300) INJECTION 61% COMPARISON:  None. FINDINGS: Lower chest: No acute abnormality. Hepatobiliary: No focal liver abnormality is seen. Status post cholecystectomy. No biliary dilatation. Pancreas: Unremarkable. No pancreatic ductal dilatation or surrounding inflammatory changes. Spleen: Normal in size without focal abnormality. Adrenals/Urinary Tract: Adrenal glands are unremarkable. Kidneys are normal, without renal calculi, focal lesion, or hydronephrosis. Bladder is unremarkable. Stomach/Bowel: Stomach is within normal limits. Appendix appears normal. No evidence of bowel wall thickening, distention, or inflammatory changes. Vascular/Lymphatic: No significant vascular findings are present. No enlarged abdominal or pelvic lymph nodes. Reproductive:  Status post hysterectomy. No adnexal masses. Other: No abdominal wall hernia or abnormality. No abdominopelvic ascites. Musculoskeletal: No acute osseous abnormality. IMPRESSION: No acute abdominal or pelvic pathology. Electronically Signed   By: HKathreen Devoid  On: 12/31/2016 21:35    Procedures Procedures (including critical care time)  Medications Ordered in ED Medications  sodium chloride 0.9 % bolus 1,000 mL (0 mLs Intravenous Stopped 12/31/16 2052)  morphine 4 MG/ML injection 4 mg (4 mg Intravenous Given 12/31/16 1937)  ondansetron (ZOFRAN) injection 4 mg (4 mg Intravenous Given 12/31/16 1937)  HYDROmorphone (DILAUDID) injection 1 mg (1 mg Intravenous Given 12/31/16 2126)  iopamidol (ISOVUE-300) 61 % injection 100 mL (100 mLs Intravenous Contrast Given 12/31/16 2104)     Initial Impression / Assessment and Plan / ED  Course  I have reviewed the triage vital signs and the nursing notes.  Pertinent labs & imaging results that were available during my care of the patient were reviewed by me and considered in my medical decision making (see chart for details).     Patient presents to the ED with right upper quadrant abdominal pain. History of pancreatitis and states this feels similar. Informed to come to the ED by gastroenterologist for rule out pancreatitis. Lipase is normal. No leukocytosis. Liver enzymes are normal. Urine shows no signs of infection. CAT scan was performed that showed no intra-abdominal abnormalities. Patient is nontoxic, nonseptic appearing, in no apparent distress.  Patient's pain and other symptoms adequately managed in emergency department.  Fluid bolus given.  Labs, imaging and vitals reviewed.  Patient does not meet the SIRS or Sepsis criteria.  On repeat exam patient does not have a surgical abdomin and there are no peritoneal signs. Pain was adequately controlled in the ED. Able tolerate by mouth fluids without any emesis. Given fluid bolus in the ED. Vital signs remained  stable. She is afebrile and not tachycardic. Have given her a PPI and Carafate in case symptoms are due to PUD.  Patient discharged home with symptomatic treatment and given strict instructions for follow-up with their gastroenterologist.  I have also discussed reasons to return immediately to the ER.  Patient expresses understanding and agrees with plan.     Final Clinical Impressions(s) / ED Diagnoses   Final diagnoses:  RUQ abdominal pain  Epigastric abdominal pain    New Prescriptions Discharge Medication List as of 12/31/2016  9:58 PM    START taking these medications   Details  omeprazole (PRILOSEC) 20 MG capsule Take 1 capsule (20 mg total) by mouth daily., Starting Mon 12/31/2016, Print    sucralfate (CARAFATE) 1 GM/10ML suspension Take 10 mLs (1 g total) by mouth 4 (four) times daily -  with meals and at bedtime., Starting Mon 12/31/2016, Print       I personally performed the services described in this documentation, which was scribed in my presence. The recorded information has been reviewed and is accurate.     Doristine Devoid, PA-C 01/01/17 0127    Quintella Reichert, MD 01/01/17 1539

## 2016-12-31 NOTE — Discharge Instructions (Signed)
All of your labs and imaging has been normal. This could be related to stomach ulcers or stomach acid. Take the Prilosec once a day for 7-10 days. May take the Carafate 3 times a day before meals to help. I would call your gastrointestinal doctor tomorrow for follow-up. Return to ED if he develops any fevers, chest pain, shortness of breath, lightheadedness, dizziness or for any reason.

## 2016-12-31 NOTE — ED Notes (Signed)
Alert, NAD, calm, interactive, resps e/u, speaking in clear complete sentences, no dyspnea noted, skin W&D, (denies: nausea, sob, or dizziness). [facility]

## 2017-01-03 DIAGNOSIS — R1012 Left upper quadrant pain: Secondary | ICD-10-CM | POA: Diagnosis not present

## 2017-01-03 DIAGNOSIS — R109 Unspecified abdominal pain: Secondary | ICD-10-CM | POA: Diagnosis not present

## 2017-01-16 DIAGNOSIS — M47816 Spondylosis without myelopathy or radiculopathy, lumbar region: Secondary | ICD-10-CM | POA: Diagnosis not present

## 2017-02-21 DIAGNOSIS — N3010 Interstitial cystitis (chronic) without hematuria: Secondary | ICD-10-CM | POA: Diagnosis not present

## 2017-02-28 ENCOUNTER — Encounter (HOSPITAL_COMMUNITY)
Admission: EM | Disposition: A | Discharge status: Home / Self Care | Payer: Self-pay | Source: Home / Self Care | Attending: Emergency Medicine

## 2017-02-28 ENCOUNTER — Emergency Department (HOSPITAL_BASED_OUTPATIENT_CLINIC_OR_DEPARTMENT_OTHER): Payer: BLUE CROSS/BLUE SHIELD

## 2017-02-28 ENCOUNTER — Observation Stay (HOSPITAL_COMMUNITY): Payer: BLUE CROSS/BLUE SHIELD | Admitting: Certified Registered Nurse Anesthetist

## 2017-02-28 ENCOUNTER — Observation Stay (HOSPITAL_BASED_OUTPATIENT_CLINIC_OR_DEPARTMENT_OTHER)
Admission: EM | Admit: 2017-02-28 | Discharge: 2017-03-01 | Disposition: A | Discharge status: Home / Self Care | Payer: BLUE CROSS/BLUE SHIELD | Attending: General Surgery | Admitting: General Surgery

## 2017-02-28 ENCOUNTER — Encounter (HOSPITAL_BASED_OUTPATIENT_CLINIC_OR_DEPARTMENT_OTHER): Payer: Self-pay | Admitting: *Deleted

## 2017-02-28 DIAGNOSIS — Z79899 Other long term (current) drug therapy: Secondary | ICD-10-CM | POA: Insufficient documentation

## 2017-02-28 DIAGNOSIS — K353 Acute appendicitis with localized peritonitis, without perforation or gangrene: Secondary | ICD-10-CM

## 2017-02-28 DIAGNOSIS — Z91030 Bee allergy status: Secondary | ICD-10-CM | POA: Diagnosis not present

## 2017-02-28 DIAGNOSIS — Z7951 Long term (current) use of inhaled steroids: Secondary | ICD-10-CM | POA: Diagnosis not present

## 2017-02-28 DIAGNOSIS — K3580 Unspecified acute appendicitis: Secondary | ICD-10-CM | POA: Diagnosis present

## 2017-02-28 DIAGNOSIS — J45909 Unspecified asthma, uncomplicated: Secondary | ICD-10-CM | POA: Insufficient documentation

## 2017-02-28 DIAGNOSIS — F329 Major depressive disorder, single episode, unspecified: Secondary | ICD-10-CM | POA: Insufficient documentation

## 2017-02-28 DIAGNOSIS — F419 Anxiety disorder, unspecified: Secondary | ICD-10-CM | POA: Diagnosis not present

## 2017-02-28 DIAGNOSIS — Z881 Allergy status to other antibiotic agents status: Secondary | ICD-10-CM | POA: Insufficient documentation

## 2017-02-28 DIAGNOSIS — Z91013 Allergy to seafood: Secondary | ICD-10-CM | POA: Diagnosis not present

## 2017-02-28 DIAGNOSIS — R1031 Right lower quadrant pain: Secondary | ICD-10-CM | POA: Diagnosis not present

## 2017-02-28 DIAGNOSIS — N809 Endometriosis, unspecified: Secondary | ICD-10-CM | POA: Diagnosis not present

## 2017-02-28 DIAGNOSIS — K352 Acute appendicitis with generalized peritonitis: Secondary | ICD-10-CM | POA: Diagnosis not present

## 2017-02-28 DIAGNOSIS — M5126 Other intervertebral disc displacement, lumbar region: Secondary | ICD-10-CM | POA: Diagnosis not present

## 2017-02-28 HISTORY — DX: Unspecified acute appendicitis: K35.80

## 2017-02-28 HISTORY — PX: LAPAROSCOPIC APPENDECTOMY: SHX408

## 2017-02-28 LAB — COMPREHENSIVE METABOLIC PANEL
ALBUMIN: 4.3 g/dL (ref 3.5–5.0)
ALK PHOS: 80 U/L (ref 38–126)
ALT: 25 U/L (ref 14–54)
ANION GAP: 11 (ref 5–15)
AST: 27 U/L (ref 15–41)
BUN: 11 mg/dL (ref 6–20)
CALCIUM: 9.2 mg/dL (ref 8.9–10.3)
CO2: 21 mmol/L — ABNORMAL LOW (ref 22–32)
CREATININE: 0.91 mg/dL (ref 0.44–1.00)
Chloride: 100 mmol/L — ABNORMAL LOW (ref 101–111)
GFR calc Af Amer: >60 mL/min (ref >60)
GFR calc non Af Amer: >60 mL/min (ref >60)
GLUCOSE: 120 mg/dL — AB (ref 65–99)
Potassium: 4.2 mmol/L (ref 3.5–5.1)
Sodium: 132 mmol/L — ABNORMAL LOW (ref 135–145)
Total Bilirubin: 0.5 mg/dL (ref 0.3–1.2)
Total Protein: 7.8 g/dL (ref 6.5–8.1)

## 2017-02-28 LAB — URINALYSIS, ROUTINE W REFLEX MICROSCOPIC
BILIRUBIN URINE: NEGATIVE
Glucose, UA: NEGATIVE mg/dL
HGB URINE DIPSTICK: NEGATIVE
KETONES UR: NEGATIVE mg/dL
Leukocytes, UA: NEGATIVE
NITRITE: NEGATIVE
PROTEIN: NEGATIVE mg/dL
SPECIFIC GRAVITY, URINE: 1.016 (ref 1.005–1.030)
pH: 6.5 (ref 5.0–8.0)

## 2017-02-28 LAB — CBC
HCT: 39.3 % (ref 36.0–46.0)
HEMOGLOBIN: 13.3 g/dL (ref 12.0–15.0)
MCH: 31.1 pg (ref 26.0–34.0)
MCHC: 33.8 g/dL (ref 30.0–36.0)
MCV: 92 fL (ref 78.0–100.0)
PLATELETS: 224 10*3/uL (ref 150–400)
RBC: 4.27 MIL/uL (ref 3.87–5.11)
RDW: 12.6 % (ref 11.5–15.5)
WBC: 18.5 10*3/uL — ABNORMAL HIGH (ref 4.0–10.5)

## 2017-02-28 LAB — LIPASE, BLOOD: Lipase: 24 U/L (ref 11–51)

## 2017-02-28 LAB — SURGICAL PCR SCREEN
MRSA, PCR: NEGATIVE
Staphylococcus aureus: NEGATIVE

## 2017-02-28 LAB — PREGNANCY, URINE: PREG TEST UR: NEGATIVE

## 2017-02-28 SURGERY — APPENDECTOMY, LAPAROSCOPIC
Anesthesia: General | Site: Abdomen

## 2017-02-28 MED ORDER — ALBUTEROL SULFATE HFA 108 (90 BASE) MCG/ACT IN AERS
2.0000 | INHALATION_SPRAY | Freq: Four times a day (QID) | RESPIRATORY_TRACT | Status: DC | PRN
  Filled 2017-02-28: qty 6.7

## 2017-02-28 MED ORDER — LURASIDONE HCL 60 MG PO TABS
1.0000 | ORAL_TABLET | Freq: Every day | ORAL | Status: DC
  Filled 2017-02-28: qty 1

## 2017-02-28 MED ORDER — SUCCINYLCHOLINE CHLORIDE 200 MG/10ML IV SOSY
PREFILLED_SYRINGE | INTRAVENOUS | Status: DC | PRN
  Administered 2017-02-28: 140 mg via INTRAVENOUS

## 2017-02-28 MED ORDER — ALBUTEROL SULFATE (2.5 MG/3ML) 0.083% IN NEBU: 2.5000 mg | INHALATION_SOLUTION | Freq: Four times a day (QID) | RESPIRATORY_TRACT | Status: DC | PRN

## 2017-02-28 MED ORDER — MEPERIDINE HCL 50 MG/ML IJ SOLN: 6.2500 mg | INTRAMUSCULAR | Status: DC | PRN

## 2017-02-28 MED ORDER — DEXAMETHASONE SODIUM PHOSPHATE 10 MG/ML IJ SOLN
INTRAMUSCULAR | Status: DC | PRN
  Administered 2017-02-28: 10 mg via INTRAVENOUS

## 2017-02-28 MED ORDER — LIDOCAINE 2% (20 MG/ML) 5 ML SYRINGE
INTRAMUSCULAR | Status: DC | PRN
  Administered 2017-02-28: 100 mg via INTRAVENOUS
  Administered 2017-02-28: 60 mg via INTRAVENOUS

## 2017-02-28 MED ORDER — PROMETHAZINE HCL 25 MG/ML IJ SOLN: 6.2500 mg | INTRAMUSCULAR | Status: DC | PRN

## 2017-02-28 MED ORDER — METRONIDAZOLE IN NACL 5-0.79 MG/ML-% IV SOLN
500.0000 mg | Freq: Once | INTRAVENOUS | Status: AC
  Administered 2017-02-28: 500 mg via INTRAVENOUS
  Filled 2017-02-28: qty 100

## 2017-02-28 MED ORDER — PROPOFOL 10 MG/ML IV BOLUS
INTRAVENOUS | Status: DC | PRN
  Administered 2017-02-28: 160 mg via INTRAVENOUS

## 2017-02-28 MED ORDER — SODIUM CHLORIDE 0.9 % IV BOLUS (SEPSIS)
1000.0000 mL | Freq: Once | INTRAVENOUS | Status: AC
  Administered 2017-02-28: 1000 mL via INTRAVENOUS

## 2017-02-28 MED ORDER — IOPAMIDOL (ISOVUE-300) INJECTION 61%
100.0000 mL | Freq: Once | INTRAVENOUS | Status: AC | PRN
  Administered 2017-02-28: 100 mL via INTRAVENOUS

## 2017-02-28 MED ORDER — LIDOCAINE 2% (20 MG/ML) 5 ML SYRINGE
INTRAMUSCULAR | Status: AC
  Filled 2017-02-28: qty 5

## 2017-02-28 MED ORDER — HYDROMORPHONE HCL 1 MG/ML IJ SOLN
INTRAMUSCULAR | Status: AC
  Filled 2017-02-28: qty 0.5

## 2017-02-28 MED ORDER — MIDAZOLAM HCL 2 MG/2ML IJ SOLN
INTRAMUSCULAR | Status: AC
  Filled 2017-02-28: qty 2

## 2017-02-28 MED ORDER — DEXTROSE 5 % IV SOLN
2.0000 g | Freq: Once | INTRAVENOUS | Status: AC
  Administered 2017-02-28: 2 g via INTRAVENOUS
  Filled 2017-02-28: qty 2

## 2017-02-28 MED ORDER — FENTANYL CITRATE (PF) 250 MCG/5ML IJ SOLN
INTRAMUSCULAR | Status: AC
  Filled 2017-02-28: qty 5

## 2017-02-28 MED ORDER — DULOXETINE HCL 30 MG PO CPEP
30.0000 mg | ORAL_CAPSULE | Freq: Every day | ORAL | Status: DC
  Administered 2017-03-01: 30 mg via ORAL
  Filled 2017-02-28 (×2): qty 1

## 2017-02-28 MED ORDER — BUPIVACAINE HCL (PF) 0.25 % IJ SOLN
INTRAMUSCULAR | Status: DC | PRN
  Administered 2017-02-28: 20 mL

## 2017-02-28 MED ORDER — LORAZEPAM 2 MG/ML IJ SOLN: 0.5000 mg | Freq: Four times a day (QID) | INTRAMUSCULAR | Status: DC | PRN

## 2017-02-28 MED ORDER — ONDANSETRON HCL 4 MG/2ML IJ SOLN
4.0000 mg | Freq: Once | INTRAMUSCULAR | Status: AC
  Administered 2017-02-28: 4 mg via INTRAVENOUS
  Filled 2017-02-28: qty 2

## 2017-02-28 MED ORDER — ONDANSETRON 4 MG PO TBDP: 4.0000 mg | ORAL_TABLET | Freq: Four times a day (QID) | ORAL | Status: DC | PRN

## 2017-02-28 MED ORDER — PROPOFOL 10 MG/ML IV BOLUS
INTRAVENOUS | Status: AC
  Filled 2017-02-28: qty 20

## 2017-02-28 MED ORDER — HYDROMORPHONE HCL 1 MG/ML IJ SOLN
1.0000 mg | Freq: Once | INTRAMUSCULAR | Status: AC
  Administered 2017-02-28: 1 mg via INTRAVENOUS
  Filled 2017-02-28: qty 1

## 2017-02-28 MED ORDER — SODIUM CHLORIDE 0.9 % IV SOLN
INTRAVENOUS | Status: DC | PRN
Start: 2017-02-28 — End: 2017-02-28
  Administered 2017-02-28: 22:00:00 via INTRAVENOUS

## 2017-02-28 MED ORDER — RINGERS IRRIGATION IR SOLN
Status: DC | PRN
  Administered 2017-02-28: 1000 mL

## 2017-02-28 MED ORDER — MONTELUKAST SODIUM 10 MG PO TABS
10.0000 mg | ORAL_TABLET | Freq: Every day | ORAL | Status: DC
  Filled 2017-02-28: qty 1

## 2017-02-28 MED ORDER — MIDAZOLAM HCL 5 MG/5ML IJ SOLN
INTRAMUSCULAR | Status: DC | PRN
  Administered 2017-02-28: 2 mg via INTRAVENOUS

## 2017-02-28 MED ORDER — OXYCODONE-ACETAMINOPHEN 5-325 MG PO TABS
1.0000 | ORAL_TABLET | ORAL | Status: DC | PRN
  Administered 2017-03-01: 2 via ORAL
  Filled 2017-02-28: qty 2

## 2017-02-28 MED ORDER — PROMETHAZINE HCL 25 MG/ML IJ SOLN
12.5000 mg | Freq: Once | INTRAMUSCULAR | Status: AC
Start: 2017-02-28 — End: 2017-02-28
  Administered 2017-02-28: 12.5 mg via INTRAVENOUS
  Filled 2017-02-28: qty 1

## 2017-02-28 MED ORDER — SODIUM CHLORIDE 0.9 % IV SOLN
20.0000 ug | Freq: Once | INTRAVENOUS | Status: DC
  Filled 2017-02-28: qty 5

## 2017-02-28 MED ORDER — ONDANSETRON HCL 4 MG/2ML IJ SOLN: 4.0000 mg | Freq: Four times a day (QID) | INTRAMUSCULAR | Status: DC | PRN

## 2017-02-28 MED ORDER — SODIUM CHLORIDE 0.45 % IV SOLN
INTRAVENOUS | Status: DC
  Filled 2017-02-28 (×2): qty 1000

## 2017-02-28 MED ORDER — HYDROMORPHONE HCL 1 MG/ML IJ SOLN
1.0000 mg | INTRAMUSCULAR | Status: DC | PRN
  Administered 2017-03-01: 1 mg via INTRAVENOUS
  Filled 2017-02-28: qty 1

## 2017-02-28 MED ORDER — DEXAMETHASONE SODIUM PHOSPHATE 10 MG/ML IJ SOLN
INTRAMUSCULAR | Status: AC
  Filled 2017-02-28: qty 1

## 2017-02-28 MED ORDER — DIPHENHYDRAMINE HCL 25 MG PO CAPS: 25.0000 mg | ORAL_CAPSULE | Freq: Four times a day (QID) | ORAL | Status: DC | PRN

## 2017-02-28 MED ORDER — LURASIDONE HCL 20 MG PO TABS
60.0000 mg | ORAL_TABLET | Freq: Every day | ORAL | Status: DC
  Filled 2017-02-28: qty 3

## 2017-02-28 MED ORDER — ROCURONIUM BROMIDE 50 MG/5ML IV SOSY
PREFILLED_SYRINGE | INTRAVENOUS | Status: AC
  Filled 2017-02-28: qty 5

## 2017-02-28 MED ORDER — PANTOPRAZOLE SODIUM 40 MG IV SOLR: 40.0000 mg | Freq: Every day | INTRAVENOUS | Status: DC

## 2017-02-28 MED ORDER — SODIUM CHLORIDE 0.9 % IR SOLN
Status: DC | PRN
  Administered 2017-02-28: 1000 mL

## 2017-02-28 MED ORDER — SODIUM CHLORIDE 0.9 % IV BOLUS (SEPSIS): 1000.0000 mL | Freq: Once | INTRAVENOUS | Status: DC

## 2017-02-28 MED ORDER — DIPHENHYDRAMINE HCL 50 MG/ML IJ SOLN: 25.0000 mg | Freq: Four times a day (QID) | INTRAMUSCULAR | Status: DC | PRN

## 2017-02-28 MED ORDER — DEXTROSE 5 % IV SOLN
2.0000 g | INTRAVENOUS | Status: DC
  Filled 2017-02-28: qty 2

## 2017-02-28 MED ORDER — SODIUM CHLORIDE 0.9 % IV SOLN: INTRAVENOUS | Status: DC

## 2017-02-28 MED ORDER — SUGAMMADEX SODIUM 200 MG/2ML IV SOLN
INTRAVENOUS | Status: DC | PRN
  Administered 2017-02-28: 200 mg via INTRAVENOUS

## 2017-02-28 MED ORDER — ROCURONIUM BROMIDE 10 MG/ML (PF) SYRINGE
PREFILLED_SYRINGE | INTRAVENOUS | Status: DC | PRN
  Administered 2017-02-28: 30 mg via INTRAVENOUS
  Administered 2017-02-28: 10 mg via INTRAVENOUS

## 2017-02-28 MED ORDER — FENTANYL CITRATE (PF) 100 MCG/2ML IJ SOLN
INTRAMUSCULAR | Status: DC | PRN
  Administered 2017-02-28: 50 ug via INTRAVENOUS
  Administered 2017-02-28: 100 ug via INTRAVENOUS
  Administered 2017-02-28: 50 ug via INTRAVENOUS

## 2017-02-28 MED ORDER — DILTIAZEM HCL ER COATED BEADS 180 MG PO CP24
180.0000 mg | ORAL_CAPSULE | ORAL | Status: DC
  Administered 2017-03-01: 180 mg via ORAL
  Filled 2017-02-28 (×2): qty 1

## 2017-02-28 MED ORDER — ONDANSETRON HCL 4 MG/2ML IJ SOLN
INTRAMUSCULAR | Status: AC
  Filled 2017-02-28: qty 2

## 2017-02-28 MED ORDER — LAMOTRIGINE 100 MG PO TABS
100.0000 mg | ORAL_TABLET | Freq: Every day | ORAL | Status: DC
  Filled 2017-02-28: qty 1

## 2017-02-28 MED ORDER — HYDROMORPHONE HCL 1 MG/ML IJ SOLN
0.5000 mg | INTRAMUSCULAR | Status: DC | PRN
  Administered 2017-02-28: 1 mg via INTRAVENOUS
  Filled 2017-02-28: qty 1

## 2017-02-28 MED ORDER — HYDROMORPHONE HCL 1 MG/ML IJ SOLN
0.2500 mg | INTRAMUSCULAR | Status: DC | PRN
  Administered 2017-02-28 (×3): 0.5 mg via INTRAVENOUS

## 2017-02-28 MED ORDER — METRONIDAZOLE IN NACL 5-0.79 MG/ML-% IV SOLN
500.0000 mg | Freq: Three times a day (TID) | INTRAVENOUS | Status: DC
  Administered 2017-03-01: 500 mg via INTRAVENOUS
  Filled 2017-02-28: qty 100

## 2017-02-28 MED ORDER — ONDANSETRON HCL 4 MG/2ML IJ SOLN
INTRAMUSCULAR | Status: DC | PRN
  Administered 2017-02-28: 4 mg via INTRAVENOUS

## 2017-02-28 MED ORDER — SUCCINYLCHOLINE CHLORIDE 200 MG/10ML IV SOSY
PREFILLED_SYRINGE | INTRAVENOUS | Status: AC
  Filled 2017-02-28: qty 10

## 2017-02-28 MED ORDER — METOPROLOL SUCCINATE ER 50 MG PO TB24
50.0000 mg | ORAL_TABLET | Freq: Two times a day (BID) | ORAL | Status: DC
  Administered 2017-03-01: 50 mg via ORAL
  Filled 2017-02-28 (×2): qty 1

## 2017-02-28 MED ORDER — ZOLPIDEM TARTRATE 5 MG PO TABS
5.0000 mg | ORAL_TABLET | Freq: Every evening | ORAL | Status: DC | PRN
  Filled 2017-02-28: qty 1

## 2017-02-28 MED ORDER — SODIUM CHLORIDE 0.45 % IV SOLN
INTRAVENOUS | Status: DC
  Administered 2017-02-28 – 2017-03-01 (×2): via INTRAVENOUS

## 2017-02-28 SURGICAL SUPPLY — 39 items
ADH SKN CLS APL DERMABOND .7 (GAUZE/BANDAGES/DRESSINGS) ×1
APPLIER CLIP ROT 10 11.4 M/L (STAPLE)
APR CLP MED LRG 11.4X10 (STAPLE)
BAG SPEC RTRVL LRG 6X4 10 (ENDOMECHANICALS) ×1
CLIP APPLIE ROT 10 11.4 M/L (STAPLE) IMPLANT
CUTTER FLEX LINEAR 45M (STAPLE) ×1 IMPLANT
DECANTER SPIKE VIAL GLASS SM (MISCELLANEOUS) ×2 IMPLANT
DERMABOND ADVANCED (GAUZE/BANDAGES/DRESSINGS) ×1
DERMABOND ADVANCED .7 DNX12 (GAUZE/BANDAGES/DRESSINGS) ×1 IMPLANT
DRAPE LAPAROSCOPIC ABDOMINAL (DRAPES) ×2 IMPLANT
DRAPE WARM FLUID 44X44 (DRAPE) ×1 IMPLANT
ELECT PENCIL ROCKER SW 15FT (MISCELLANEOUS) ×1 IMPLANT
ELECT REM PT RETURN 15FT ADLT (MISCELLANEOUS) ×2 IMPLANT
ENDOLOOP SUT PDS II  0 18 (SUTURE)
ENDOLOOP SUT PDS II 0 18 (SUTURE) IMPLANT
GLOVE BIOGEL PI IND STRL 7.0 (GLOVE) ×1 IMPLANT
GLOVE BIOGEL PI INDICATOR 7.0 (GLOVE) ×1
GLOVE INDICATOR 8.0 STRL GRN (GLOVE) ×4 IMPLANT
GLOVE SS BIOGEL STRL SZ 8 (GLOVE) ×1 IMPLANT
GLOVE SUPERSENSE BIOGEL SZ 8 (GLOVE) ×1
GOWN STRL REUS W/TWL LRG LVL3 (GOWN DISPOSABLE) ×2 IMPLANT
GOWN STRL REUS W/TWL XL LVL3 (GOWN DISPOSABLE) ×4 IMPLANT
HEMOSTAT SURGICEL 2X4 FIBR (HEMOSTASIS) ×1 IMPLANT
IRRIG SUCT STRYKERFLOW 2 WTIP (MISCELLANEOUS) ×2
IRRIGATION SUCT STRKRFLW 2 WTP (MISCELLANEOUS) ×1 IMPLANT
KIT BASIN OR (CUSTOM PROCEDURE TRAY) ×2 IMPLANT
POUCH SPECIMEN RETRIEVAL 10MM (ENDOMECHANICALS) ×2 IMPLANT
RELOAD 45 VASCULAR/THIN (ENDOMECHANICALS) IMPLANT
RELOAD STAPLE 45 2.5 WHT GRN (ENDOMECHANICALS) IMPLANT
RELOAD STAPLE 45 3.5 BLU ETS (ENDOMECHANICALS) IMPLANT
RELOAD STAPLE TA45 3.5 REG BLU (ENDOMECHANICALS) ×2 IMPLANT
SHEARS HARMONIC ACE PLUS 36CM (ENDOMECHANICALS) ×1 IMPLANT
SUT MNCRL AB 4-0 PS2 18 (SUTURE) ×2 IMPLANT
TOWEL OR 17X26 10 PK STRL BLUE (TOWEL DISPOSABLE) ×2 IMPLANT
TRAY FOLEY W/METER SILVER 16FR (SET/KITS/TRAYS/PACK) ×2 IMPLANT
TRAY LAPAROSCOPIC (CUSTOM PROCEDURE TRAY) ×2 IMPLANT
TROCAR XCEL 12X100 BLDLESS (ENDOMECHANICALS) ×2 IMPLANT
TROCAR XCEL BLUNT TIP 100MML (ENDOMECHANICALS) ×2 IMPLANT
TUBING INSUF HEATED (TUBING) ×2 IMPLANT

## 2017-02-28 NOTE — Progress Notes (Signed)
I spoke with Dr. Alen Blew regarding this patient's possible diagnosis of Von willebrand's disease. He has reviewed her chart and does not feel that she has Von willebrands, or she may possibly just a very mild case. He does not recommend any preoperative DDAVP, and it is ok to start chemical DVT prophylaxis postoperatively as we would for any postoperative patient.  Sigfredo Schreier A MILLER

## 2017-02-28 NOTE — Anesthesia Preprocedure Evaluation (Signed)
Anesthesia Evaluation  Patient identified by MRN, date of birth, ID band Patient awake    Reviewed: Allergy & Precautions, H&P , NPO status , Patient's Chart, lab work & pertinent test results, reviewed documented beta blocker date and time   History of Anesthesia Complications Negative for: history of anesthetic complications  Airway Mallampati: II  TM Distance: >3 FB Neck ROM: full    Dental no notable dental hx. (+) Teeth Intact, Dental Advisory Given   Pulmonary asthma ,    Pulmonary exam normal breath sounds clear to auscultation       Cardiovascular hypertension, Pt. on home beta blockers and Pt. on medications + Peripheral Vascular Disease   Rhythm:regular Rate:Normal     Neuro/Psych PSYCHIATRIC DISORDERS Anxiety Depression  Neuromuscular disease    GI/Hepatic hiatal hernia,   Endo/Other  Morbid obesity  Renal/GU      Musculoskeletal   Abdominal   Peds  Hematology  (+) anemia , Von [place]and Rx'd with DDAVP   Anesthesia Other Findings Von Willebrand disease   POTS (postural orthostatic tachycardia syndrome)    Asthma, very rare inhaler use.... Chest clear   Anxiety    Depression   H/O hiatal hernia    Celiac and mesenteric artery injury   Sphincter of Oddi dysfunction    Shortness of breath dyspnea   History of kidney stones    Surgical History       Reproductive/Obstetrics                             Anesthesia Physical  Anesthesia Plan  ASA: III and emergent  Anesthesia Plan: General   Post-op Pain Management:    Induction: Intravenous, Rapid sequence and Cricoid pressure planned  Airway Management Planned: Oral ETT  Additional Equipment:   Intra-op Plan:   Post-operative Plan: Extubation in OR  Informed Consent: I have reviewed the patients History and Physical, chart, labs and discussed the procedure including the risks, benefits and alternatives for  the proposed anesthesia with the patient or authorized representative who has indicated his/her understanding and acceptance.   Dental Advisory Given and Dental advisory given  Plan Discussed with: CRNA and Surgeon  Anesthesia Plan Comments:         Anesthesia Quick Evaluation

## 2017-02-28 NOTE — Anesthesia Procedure Notes (Signed)
Procedure Name: Intubation Date/Time: 02/28/2017 10:14 PM Performed by: Noralyn Pick D Pre-anesthesia Checklist: Patient identified, Emergency Drugs available, Suction available and Patient being monitored Patient Re-evaluated:Patient Re-evaluated prior to inductionOxygen Delivery Method: Circle system utilized Preoxygenation: Pre-oxygenation with 100% oxygen Intubation Type: IV induction Ventilation: Mask ventilation without difficulty Laryngoscope Size: Mac and 4 Grade View: Grade I Tube type: Oral Number of attempts: 1 Airway Equipment and Method: Stylet Placement Confirmation: ETT inserted through vocal cords under direct vision,  positive ETCO2 and breath sounds checked- equal and bilateral Secured at: 21 cm Tube secured with: Tape Dental Injury: Teeth and Oropharynx as per pre-operative assessment

## 2017-02-28 NOTE — H&P (Signed)
Kaitlyn Johnson is an 36 y.o. female.   Chief Complaint: abdominal pain HPI: pt transfer from [facility] ED at [facility] for 1 day history of abdominal pain.  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 She has a 1 day history of RLQ abdominal pain and CT findings of acute appendicitis Complains of tenderness  In her RLQ of abdomen.   Past Medical History:  Diagnosis Date  . Anemia 11/26/2011  . Anxiety   . Asthma   . Celiac and mesenteric artery injury   . Cushing's syndrome ([place])    06/21/16- "in remission"  . Depression   . H/O hiatal hernia   . History of kidney stones   . POTS (postural orthostatic tachycardia syndrome)   . Shortness of breath dyspnea    with exertertion  . Sphincter of Oddi dysfunction   . Von Willebrand disease ([place])    bruising easy    Past Surgical History:  Procedure Laterality Date  . ABDOMINAL HYSTERECTOMY  08/2010  . ANTERIOR CERVICAL DECOMP/DISCECTOMY FUSION N/A 03/31/2013   Procedure: ANTERIOR CERVICAL DECOMPRESSION/DISCECTOMY FUSION 1 LEVEL Cervical five-six;  Surgeon: Faythe Ghee, MD;  Location: [facility];  Service: Neurosurgery;  Laterality: N/A;  . Cholangio-Pancreatography with Spintectorotomy + Stent  07/16/2012   01/15/14  . CHOLECYSTECTOMY    . LAPAROSCOPIC ENDOMETRIOSIS FULGURATION    . LUMBAR LAMINECTOMY    . LUMBAR LAMINECTOMY/DECOMPRESSION MICRODISCECTOMY Left 06/22/2016   Procedure: MICRODISCECTOMY LEFT LUMBAR FOUR-FIVE;  Surgeon: Consuella Lose, MD;  Location: [place] [facility];  Service: Neurosurgery;  Laterality: Left;  . ROUX-EN-Y PROCEDURE  04/1999    Family History  Problem Relation Age of Onset  . Hypertension Mother    Social History:  reports that she has never smoked. She has never used smokeless tobacco. She reports that she drinks about 3.0 oz of alcohol per week . She reports that she does not use drugs.  Allergies:  Allergies  Allergen Reactions  . Bee Venom  Anaphylaxis  . Reglan [Metoclopramide] Other (See Comments)    Reaction:  Oculogyric crisis   . Shellfish Allergy Anaphylaxis  . Wheat Bran Other (See Comments)    Pt has celiac disease.    . Adhesive [Tape] Hives  . Gluten Meal Other (See Comments)    Pt has celiac disease.   . Sulfa Antibiotics Rash  . Tartrazine Rash  . Yellow Dye Rash    Medications Prior to Admission  Medication Sig Dispense Refill  . albuterol (PROAIR HFA) 108 (90 Base) MCG/ACT inhaler Inhale 1-2 puffs into the lungs every 6 (six) hours as needed for wheezing or shortness of breath.     Lorrin Mais CR 12.5 MG CR tablet Take 12.5 mg by mouth at bedtime.  1  . celecoxib (CELEBREX) 200 MG capsule Take 200 mg by mouth 2 (two) times daily.    . clonazePAM (KLONOPIN) 1 MG tablet Take 1 tablet (1 mg total) by mouth 2 (two) times daily as needed for anxiety. 30 tablet 0  . cyanocobalamin (,VITAMIN B-12,) 1000 MCG/ML injection Inject 1,000 mcg into the skin every 30 (thirty) days.     Marland Kitchen diltiazem (CARDIZEM CD) 180 MG 24 hr capsule Take 180 mg by mouth daily.   10  . DULoxetine (CYMBALTA) 30 MG capsule Take 30 mg by mouth daily.    Marland Kitchen EPINEPHrine (EPIPEN) 0.3 mg/0.3 mL SOAJ injection Inject 0.3 mg into the muscle once as needed (for severe allergic  reaction).     . hydrOXYzine (ATARAX/VISTARIL) 10 MG tablet Take 10 mg by mouth daily.   2  . lamoTRIgine (LAMICTAL) 100 MG tablet Take 100 mg by mouth at bedtime.     Marland Kitchen LATUDA 60 MG TABS Take 60 mg by mouth at bedtime.   1  . levalbuterol (XOPENEX) 0.31 MG/3ML nebulizer solution Take 1 ampule by nebulization every 4 (four) hours as needed for wheezing.    . metoprolol succinate (TOPROL-XL) 50 MG 24 hr tablet Take 50 mg by mouth 2 (two) times daily.   10  . montelukast (SINGULAIR) 10 MG tablet Take 10 mg by mouth at bedtime.    Marland Kitchen omeprazole (PRILOSEC) 20 MG capsule Take 1 capsule (20 mg total) by mouth daily. 14 capsule 0  . traZODone (DESYREL) 100 MG tablet Take 100 mg by mouth at  bedtime.      Results for orders placed or performed during the hospital encounter of 02/28/17 (from the past 48 hour(s))  Lipase, blood     Status: None   Collection Time: 02/28/17 12:02 PM  Result Value Ref Range   Lipase 24 11 - 51 U/L  Comprehensive metabolic panel     Status: Abnormal   Collection Time: 02/28/17 12:02 PM  Result Value Ref Range   Sodium 132 (L) 135 - 145 mmol/L   Potassium 4.2 3.5 - 5.1 mmol/L   Chloride 100 (L) 101 - 111 mmol/L   CO2 21 (L) 22 - 32 mmol/L   Glucose, Bld 120 (H) 65 - 99 mg/dL   BUN 11 6 - 20 mg/dL   Creatinine, Ser 0.91 0.44 - 1.00 mg/dL   Calcium 9.2 8.9 - 10.3 mg/dL   Total Protein 7.8 6.5 - 8.1 g/dL   Albumin 4.3 3.5 - 5.0 g/dL   AST 27 15 - 41 U/L   ALT 25 14 - 54 U/L   Alkaline Phosphatase 80 38 - 126 U/L   Total Bilirubin 0.5 0.3 - 1.2 mg/dL   GFR calc non Af Amer >60 >60 mL/min   GFR calc Af Amer >60 >60 mL/min    Comment: (NOTE) The eGFR has been calculated using the CKD EPI equation. This calculation has not been validated in all clinical situations. eGFR's persistently <60 mL/min signify possible Chronic Kidney Disease.    Anion gap 11 5 - 15  CBC     Status: Abnormal   Collection Time: 02/28/17 12:02 PM  Result Value Ref Range   WBC 18.5 (H) 4.0 - 10.5 K/uL   RBC 4.27 3.87 - 5.11 MIL/uL   Hemoglobin 13.3 12.0 - 15.0 g/dL   HCT 39.3 36.0 - 46.0 %   MCV 92.0 78.0 - 100.0 fL   MCH 31.1 26.0 - 34.0 pg   MCHC 33.8 30.0 - 36.0 g/dL   RDW 12.6 11.5 - 15.5 %   Platelets 224 150 - 400 K/uL  Urinalysis, Routine w reflex microscopic     Status: None   Collection Time: 02/28/17 12:02 PM  Result Value Ref Range   Color, Urine YELLOW YELLOW   APPearance CLEAR CLEAR   Specific Gravity, Urine 1.016 1.005 - 1.030   pH 6.5 5.0 - 8.0   Glucose, UA NEGATIVE NEGATIVE mg/dL   Hgb urine dipstick NEGATIVE NEGATIVE   Bilirubin Urine NEGATIVE NEGATIVE   Ketones, ur NEGATIVE NEGATIVE mg/dL   Protein, ur NEGATIVE NEGATIVE mg/dL   Nitrite  NEGATIVE NEGATIVE   Leukocytes, UA NEGATIVE NEGATIVE    Comment: Microscopic not  done on urines with negative protein, blood, leukocytes, nitrite, or glucose < 500 mg/dL.  Pregnancy, urine     Status: None   Collection Time: 02/28/17 12:02 PM  Result Value Ref Range   Preg Test, Ur NEGATIVE NEGATIVE    Comment:        THE SENSITIVITY OF THIS METHODOLOGY IS >20 mIU/mL.    Ct Abdomen Pelvis W Contrast  Result Date: 02/28/2017 CLINICAL DATA:  Right lower quadrant pain. Prior history of Roux-en-Y. EXAM: CT ABDOMEN AND PELVIS WITH CONTRAST TECHNIQUE: Multidetector CT imaging of the abdomen and pelvis was performed using the standard protocol following bolus administration of intravenous contrast. CONTRAST:  165m ISOVUE-300 IOPAMIDOL (ISOVUE-300) INJECTION 61% COMPARISON:  12/31/2016 FINDINGS: Lower chest: Lung bases are clear. Hepatobiliary: No focal liver abnormality is seen. Status post cholecystectomy. No biliary dilatation. Pancreas: Unremarkable. No pancreatic ductal dilatation or surrounding inflammatory changes. Spleen: Normal in size without focal abnormality. Adrenals/Urinary Tract: Adrenal glands are unremarkable. Kidneys are normal, without renal calculi, focal lesion, or hydronephrosis. 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 Bladder is unremarkable. Stomach/Bowel: Prior Roux-en-Y surgery. Postsurgical changes noted in the upper abdomen. No evidence of bowel wall thickening, distention, or inflammatory changes. Dilated appendix measuring 12 mm with periappendiceal inflammatory changes most concerning for mild acute appendicitis. Vascular/Lymphatic: No significant vascular findings are present. No enlarged abdominal or pelvic lymph nodes. Reproductive: Status post hysterectomy. No adnexal masses. Other: No abdominal wall hernia or abnormality. No abdominopelvic ascites. Musculoskeletal: No acute osseous abnormality. No lytic or sclerotic osseous lesion. Abnormal  articulation of the right L5 transverse process with the sacrum. IMPRESSION: 1. Findings most consistent with acute appendicitis. No focal fluid collection to suggest an abscess. Electronically Signed   By: HKathreen Devoid  On: 02/28/2017 13:33    Review of Systems  Constitutional: Positive for chills and fever.  HENT: Negative for hearing loss.   Eyes: Negative for blurred vision.  Respiratory: Negative for cough and hemoptysis.   Cardiovascular: Negative for chest pain, palpitations and orthopnea.  Gastrointestinal: Positive for abdominal pain.  Genitourinary: Negative for dysuria and urgency.  Musculoskeletal: Positive for myalgias and neck pain.  Skin: Negative for rash.  Neurological: Positive for dizziness. Negative for headaches.  Psychiatric/Behavioral: Negative for depression and suicidal ideas.    Blood pressure 102/61, pulse 91, temperature 98.6 F (37 C), temperature source Oral, resp. rate 16, height _0  (1.753 m), weight 108 kg (238 lb), SpO2 98 %. Physical Exam  Constitutional: She appears well-developed and well-nourished.  HENT:  Head: Normocephalic and atraumatic.  Eyes: EOM are normal. Pupils are equal, round, and reactive to light.  Neck: Normal range of motion. Neck supple.  Cardiovascular: Normal rate and regular rhythm.   Respiratory: Effort normal.  GI: There is tenderness in the right lower quadrant. There is guarding and tenderness at McBurney's point. There is no rigidity and no rebound.       Assessment/Plan Acute appendicitis   Multiple previous abdominal surgeries   Patient Active Problem List   Diagnosis Date Noted  . Acute appendicitis 02/28/2017  . HNP (herniated nucleus pulposus), lumbar 06/22/2016  . Nausea with vomiting 11/30/2013  . Abdominal pain, epigastric 11/30/2013  . Dehydration 11/30/2013  . Acute pancreatitis 11/30/2013  . Pancreatitis 11/29/2013  . Gastroenteritis due to norovirus 11/26/2013  . Hypomagnesemia 11/25/2013  .  Nausea vomiting and diarrhea 11/23/2013  . Anemia of chronic disease 11/23/2013  . Hypokalemia 11/23/2013  . Hyponatremia 11/23/2013  . Nausea  and vomiting 11/23/2013  . Iatrogenic Cushing's syndrome ([place]) 10/02/2013  . Endometriosis 09/01/2013  . Celiac disease 09/01/2013  . Asthma, chronic 09/01/2013  . 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 POTS (postural orthostatic tachycardia syndrome) 11/26/2011    Discussed medical and surgical options  high chance she will need a laparotomy and open procedure but will try laparoscopy first Risk of open procedure,  Bowel injury,  Abscess and bowel resection discussed.  She desire appendectomy  The procedure has been discussed with the patient.  Alternative therapies have been discussed with the patient.  Operative risks include bleeding,  Infection,  Organ injury,  Nerve injury,  Blood vessel injury,  DVT,  Pulmonary embolism,  Death,  And possible reoperation.  Medical management risks include worsening of present situation.  The success of the procedure is 50 -100 % at treating patients symptoms.  The patient understands and agrees to proceed.   Lakelynn Severtson A., MD 02/28/2017, 7:45 PM

## 2017-02-28 NOTE — ED Triage Notes (Signed)
Woke at 4am with right lower quadrant pain and vomiting. Rebound tenderness.

## 2017-02-28 NOTE — ED Provider Notes (Signed)
[facility] Provider Note   CSN: [id number] Arrival date & time: 02/28/17  1122     History   Chief Complaint Chief Complaint  Patient presents with  . Abdominal Pain    HPI Kaitlyn Johnson is a 36 y.o. female who presents with right lower quadrant abdominal pain. Past medical history significant for history of pancreatitis, superior mesenteric artery syndrome, von Willebrand disease, Cushing's syndrome, POTS, asthma. Past surgical history significant for hysterectomy and right oopherectomy, cholecystectomy, sphincter of Oddi surgery, Roux en Y surgery. She states that this morning at 5AM she got up to feed her 2-month-old twins. She states that they have a surrogate who gave birth to the twins. She started to feel nauseous and having generalized abdominal pain and went to lie down. The pain worsened and localized to the right lower quadrant. She had multiple episodes of vomiting. No fever, chills, chest pain, shortness of breath, upper abdominal pain, diarrhea, blood in the stool, dysuria, hematuria, flank pain, vaginal discharge.  HPI  Past Medical History:  Diagnosis Date  . Anemia 11/26/2011  . Anxiety   . Asthma   . Celiac and mesenteric artery injury   . Cushing's syndrome ([facility])    06/21/16- "in remission"  . Depression   . H/O hiatal hernia   . History of kidney stones   . POTS (postural orthostatic tachycardia syndrome)   . Shortness of breath dyspnea    with exertertion  . Sphincter of Oddi dysfunction   . Von Willebrand disease ([facility])    bruising easy    Patient Active Problem List   Diagnosis Date Noted  . HNP (herniated nucleus pulposus), lumbar 06/22/2016  . Nausea with vomiting 11/30/2013  . Abdominal pain, epigastric 11/30/2013  . Dehydration 11/30/2013  . Acute pancreatitis 11/30/2013  . Pancreatitis 11/29/2013  . Gastroenteritis due to norovirus 11/26/2013  . Hypomagnesemia 11/25/2013  . Nausea vomiting and diarrhea 11/23/2013  .  Anemia of chronic disease 11/23/2013  . Hypokalemia 11/23/2013  . Hyponatremia 11/23/2013  . Nausea and vomiting 11/23/2013  . Iatrogenic Cushing's syndrome ([facility]) 10/02/2013  . Endometriosis 09/01/2013  . Celiac disease 09/01/2013  . Asthma, chronic 09/01/2013  . 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 POTS (postural orthostatic tachycardia syndrome) 11/26/2011    Past Surgical History:  Procedure Laterality Date  . ABDOMINAL HYSTERECTOMY  08/2010  . ANTERIOR CERVICAL DECOMP/DISCECTOMY FUSION N/A 03/31/2013   Procedure: ANTERIOR CERVICAL DECOMPRESSION/DISCECTOMY FUSION 1 LEVEL Cervical five-six;  Surgeon: Reinaldo Meeker, MD;  Location: [facility];  Service: Neurosurgery;  Laterality: N/A;  . Cholangio-Pancreatography with Spintectorotomy + Stent  07/16/2012   01/15/14  . CHOLECYSTECTOMY    . LAPAROSCOPIC ENDOMETRIOSIS FULGURATION    . LUMBAR LAMINECTOMY    . LUMBAR LAMINECTOMY/DECOMPRESSION MICRODISCECTOMY Left 06/22/2016   Procedure: MICRODISCECTOMY LEFT LUMBAR FOUR-FIVE;  Surgeon: Lisbeth Renshaw, MD;  Location: [facility];  Service: Neurosurgery;  Laterality: Left;  . ROUX-EN-Y PROCEDURE  04/1999    OB History    No data available       Home Medications    Prior to Admission medications   Medication Sig Start Date End Date Taking? Authorizing Provider  albuterol (PROAIR HFA) 108 (90 Base) MCG/ACT inhaler Inhale 2 puffs into the lungs every 6 (six) hours as needed. Shortness of breath/wheezing 01/20/15   Historical Provider, MD  AMBIEN CR 12.5 MG CR tablet Take 12.5 mg by mouth at bedtime. 10/31/15   Historical Provider, MD  clonazePAM (KLONOPIN) 1 MG tablet  Take 1 tablet (1 mg total) by mouth 2 (two) times daily as needed for anxiety. 12/07/13   Dorothea Ogle, MD  cyanocobalamin (,VITAMIN B-12,) 1000 MCG/ML injection Inject 1,000 mcg into the skin every 30 (thirty) days.     Historical Provider, MD  cyclobenzaprine (FLEXERIL) 10 MG tablet Take 1 tablet (10 mg  total) by mouth 3 (three) times daily as needed for muscle spasms. 06/23/16   Tia Alert, MD  diltiazem (CARDIZEM CD) 180 MG 24 hr capsule Take 1 capsule by mouth every morning.  05/29/16   Historical Provider, MD  diltiazem (DILACOR XR) 180 MG 24 hr capsule Take 180 mg by mouth every morning.     Historical Provider, MD  DULoxetine (CYMBALTA) 30 MG capsule Take 30 mg by mouth daily.    Historical Provider, MD  EPINEPHrine (EPIPEN) 0.3 mg/0.3 mL SOAJ injection Inject 0.3 mg into the muscle once as needed (severe allergic reaction).    Historical Provider, MD  hydrOXYzine (ATARAX/VISTARIL) 10 MG tablet Take 1 tablet by mouth daily. 04/26/16   Historical Provider, MD  lamoTRIgine (LAMICTAL) 100 MG tablet Take 100 mg by mouth at bedtime.  08/03/11   Historical Provider, MD  LATUDA 60 MG TABS Take 1 tablet by mouth at bedtime. 06/06/16   Historical Provider, MD  levalbuterol (XOPENEX) 0.31 MG/3ML nebulizer solution Inhale 3 mLs into the lungs every 6 (six) hours as needed. Shortness of breath/wheezing    Historical Provider, MD  metoprolol succinate (TOPROL-XL) 50 MG 24 hr tablet Take 1 tablet by mouth 2 (two) times daily. 05/22/16   Historical Provider, MD  montelukast (SINGULAIR) 10 MG tablet Take 10 mg by mouth at bedtime.    Historical Provider, MD  naproxen (NAPROSYN) 500 MG tablet Take 1 tablet (500 mg total) by mouth 2 (two) times daily with a meal. 06/08/16   Arthor Captain, PA-C  omeprazole (PRILOSEC) 20 MG capsule Take 1 capsule (20 mg total) by mouth daily. 12/31/16   Rise Mu, PA-C  oxyCODONE-acetaminophen (PERCOCET) 5-325 MG tablet Take 1-2 tablets by mouth every 6 (six) hours as needed for moderate pain. 06/23/16   Tia Alert, MD  promethazine (PHENERGAN) 25 MG tablet Take 1 tablet (25 mg total) by mouth every 6 (six) hours as needed for nausea or vomiting. 11/04/15   Barrett Henle, PA-C  sucralfate (CARAFATE) 1 GM/10ML suspension Take 10 mLs (1 g total) by mouth 4 (four) times  daily -  with meals and at bedtime. 12/31/16   Rise Mu, PA-C  traZODone (DESYREL) 100 MG tablet Take 100 mg by mouth at bedtime. 02/05/15   Historical Provider, MD    Family History Family History  Problem Relation Age of Onset  . Hypertension Mother     Social History Social History  Substance Use Topics  . Smoking status: Never Smoker  . Smokeless tobacco: Never Used  . Alcohol use 3.0 oz/week    5 Glasses of wine per week     Allergies   Bee venom; Reglan [metoclopramide]; Shellfish allergy; Wheat bran; Other; Sulfa antibiotics; Tartrazine; and Yellow dye   Review of Systems Review of Systems  Constitutional: Negative for chills and fever.  Respiratory: Negative for shortness of breath.   Cardiovascular: Negative for chest pain.  Gastrointestinal: Positive for abdominal pain, nausea and vomiting. Negative for blood in stool, constipation and diarrhea.  Genitourinary: Negative for dysuria, flank pain and vaginal discharge.  All other systems reviewed and are negative.    Physical  Exam Updated Vital Signs BP 118/78 (BP Location: Right Arm)   Pulse (!) 112   Temp 99.1 F (37.3 C) (Oral)   Resp 12   Ht 5\' 9"  (1.753 m)   Wt 108 kg   SpO2 98%   BMI 35.15 kg/m   Physical Exam  Constitutional: She is oriented to person, place, and time. She appears well-developed and well-nourished. No distress.  Calm, cooperative  HENT:  Head: Normocephalic and atraumatic.  Eyes: Conjunctivae are normal. Pupils are equal, round, and reactive to light. Right eye exhibits no discharge. Left eye exhibits no discharge. No scleral icterus.  Neck: Normal range of motion.  Cardiovascular: Regular rhythm.  Tachycardia present.  Exam reveals no gallop and no friction rub.   No murmur heard. Pulmonary/Chest: Effort normal and breath sounds normal. No respiratory distress. She has no wheezes. She has no rales. She exhibits no tenderness.  Abdominal: Soft. Bowel sounds are normal. She  exhibits no distension and no mass. There is tenderness (RLQ tenderness). There is rebound. There is no guarding. No hernia.  Multiple well healed surgical scars. +Rovsing's and Obturator.  Neurological: She is alert and oriented to person, place, and time.  Skin: Skin is warm and dry.  Psychiatric: She has a normal mood and affect. Her behavior is normal.  Nursing note and vitals reviewed.    ED Treatments / Results  Labs (all labs ordered are listed, but only abnormal results are displayed) Labs Reviewed  COMPREHENSIVE METABOLIC PANEL - Abnormal; Notable for the following:       Result Value   Sodium 132 (*)    Chloride 100 (*)    CO2 21 (*)    Glucose, Bld 120 (*)    All other components within normal limits  CBC - Abnormal; Notable for the following:    WBC 18.5 (*)    All other components within normal limits  LIPASE, BLOOD  URINALYSIS, ROUTINE W REFLEX MICROSCOPIC  PREGNANCY, URINE    EKG  EKG Interpretation None       Radiology Ct Abdomen Pelvis W Contrast  Result Date: 02/28/2017 CLINICAL DATA:  Right lower quadrant pain. Prior history of Roux-en-Y. EXAM: CT ABDOMEN AND PELVIS WITH CONTRAST TECHNIQUE: Multidetector CT imaging of the abdomen and pelvis was performed using the standard protocol following bolus administration of intravenous contrast. CONTRAST:  ISOVUE-300 IOPAMIDOL (ISOVUE-300) INJECTION 61% COMPARISON:  12/31/2016 FINDINGS: Lower chest: Lung bases are clear. Hepatobiliary: No focal liver abnormality is seen. Status post cholecystectomy. No biliary dilatation. Pancreas: Unremarkable. No pancreatic ductal dilatation or surrounding inflammatory changes. Spleen: Normal in size without focal abnormality. Adrenals/Urinary Tract: Adrenal glands are unremarkable. Kidneys are normal, without renal calculi, focal lesion, or hydronephrosis. 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 Bladder is unremarkable. Stomach/Bowel: Prior Roux-en-Y  surgery. Postsurgical changes noted in the upper abdomen. No evidence of bowel wall thickening, distention, or inflammatory changes. Dilated appendix measuring 12 mm with periappendiceal inflammatory changes most concerning for mild acute appendicitis. Vascular/Lymphatic: No significant vascular findings are present. No enlarged abdominal or pelvic lymph nodes. Reproductive: Status post hysterectomy. No adnexal masses. Other: No abdominal wall hernia or abnormality. No abdominopelvic ascites. Musculoskeletal: No acute osseous abnormality. No lytic or sclerotic osseous lesion. Abnormal articulation of the right L5 transverse process with the sacrum. IMPRESSION: 1. Findings most consistent with acute appendicitis. No focal fluid collection to suggest an abscess. Electronically Signed   By: Elige Ko   On: 02/28/2017 13:33  Procedures Procedures (including critical care time)  Medications Ordered in ED Medications  cefTRIAXone (ROCEPHIN) 2 g in dextrose 5 % 50 mL IVPB (2 g Intravenous New Bag/Given 02/28/17 1346)    And  metroNIDAZOLE (FLAGYL) IVPB 500 mg (not administered)  sodium chloride 0.9 % bolus 1,000 mL (1,000 mLs Intravenous New Bag/Given 02/28/17 1239)  HYDROmorphone (DILAUDID) injection 1 mg (1 mg Intravenous Given 02/28/17 1238)  ondansetron (ZOFRAN) injection 4 mg (4 mg Intravenous Given 02/28/17 1238)  iopamidol (ISOVUE-300) 61 % injection 100 mL (100 mLs Intravenous Contrast Given 02/28/17 1315)  HYDROmorphone (DILAUDID) injection 1 mg (1 mg Intravenous Given 02/28/17 1346)     Initial Impression / Assessment and Plan / ED Course  I have reviewed the triage vital signs and the nursing notes.  Pertinent labs & imaging results that were available during my care of the patient were reviewed by me and considered in my medical decision making (see chart for details).  36 year old female with acute appendicitis. She is mildly tachycardic otherwise vital signs are normal. CBC remarkable for  leukocytosis of 18.5. CMP remarkable for mild hyponatremia, hypochloremia, mild hyperglycemia. UA is clean. Pain medicines and fluids orders as well as CT of abdomen and pelvis..  CT remarkable for mild appendicitis without evidence of abscess or perforation. Antibiotics started. [facility] Spoke with Evangeline GulaBrooke Miller, PA-C with surgery who [facility]. Appreciate assistance.  Final Clinical Impressions(s) / ED Diagnoses   Final diagnoses:  Acute appendicitis with localized peritonitis    New Prescriptions New Prescriptions   No medications on file     Bethel BornKelly Marie Jules Vidovich, PA-C 02/28/17 [id number]    Brodrick Curran Marie [place], PA-C 02/28/17 1510    Jacalyn LefevreJulie Haviland, MD 02/28/17 [phone]

## 2017-02-28 NOTE — Progress Notes (Signed)
Patient transported to the OR via bed. Maurine MinisterDennis the transported handed the passport.  Dr Luisa Hartornett will perform the surgery.

## 2017-02-28 NOTE — Progress Notes (Addendum)
Called by Dr. Gilford Raid regarding Mrs. Kaitlyn Johnson, 36 yo F with history of pots, asthma, depression,?  Von Willebrand's, presents to [facility] with abdominal pain, found to have acute appendicitis.  Discussed with general surgery, patient will be admitted onto their service as medical problems are stable and will be seen in consultation by the hospitalist team as soon as she arrives to [facility].  RN, on patient arrival, please call [phone] and let patient placement RN know of the patient's arrival and a hospitalist will be assigned to consult on the patient.   Costin M. Cruzita Lederer, MD [facility] [phone]  If 7PM-7AM, please contact night-coverage [email or web address] Password [facility]  12/07/2016, 5:37 AM

## 2017-02-28 NOTE — Progress Notes (Signed)
[facility] received [facility] per [facility]. To room 1618

## 2017-02-28 NOTE — Op Note (Signed)
Appendectomy, Laparoscopic, Procedure Note  Indications: The patient presented with a history of right-sided abdominal pain. A CT and exam  revealed findings consistent with acute appendicitis.The procedure has been discussed with the patient.  Alternative therapies have been discussed with the patient.  Operative risks include bleeding,  Infection,  Organ injury, bowel injury,  Nerve injury,  Blood vessel injury,  DVT,  Pulmonary embolism,  Death, and possible reoperation.  Medical management risks include worsening of present situation.  The success of the procedure is 95 - 100%  % at treating patients symptoms.  The patient understands and agrees to proceed.  Pre-operative Diagnosis: Acute appendicitis without mention of peritonitis  Post-operative Diagnosis: Same  Surgeon: Kale Rondeau A.   Assistants: OR staff  Anesthesia: General endotracheal anesthesia and Local anesthesia 0.25.% bupivacaine  ASA Class: 2  Procedure Details  The patient was seen again in the Holding Room. The risks, benefits, complications, treatment options, and expected outcomes were discussed with the patient and/or family. The possibilities of reaction to medication, pulmonary aspiration, perforation of viscus, bleeding, recurrent infection, finding a normal appendix, the need for additional procedures, failure to diagnose a condition, and creating a complication requiring transfusion or operation were discussed. There was concurrence with the proposed plan and informed consent was obtained. The site of surgery was properly noted/marked. The patient was taken to Operating Room, identified as Kaitlyn Johnson and the procedure verified as Appendectomy. A Time Out was held and the above information confirmed.  The patient was placed in the supine position and general anesthesia was induced, along with placement of orogastric tube, Venodyne boots, and a Foley catheter. The abdomen was prepped and draped in a  sterile fashion. A one centimeter infraumbilical incision was made and the peritoneal cavity was accessed using the OPEN  technique. The pneumoperitoneum was then established to steady pressure of 12 mmHg. A 12 mm port was placed through the umbilical incision. Additional 5 mm cannulas then placed in the left lower quadrant of the abdomen and half way between the umbilicus and xyphoid process  under direct vision. A careful evaluation of the entire abdomen was carried out. Adhesions noted in left lower quadrant and epigastric region.  No sign of internal injury from port placement.  The patient was placed in Trendelenburg and left lateral decubitus position. The small intestines were retracted in the cephalad and left lateral direction away from the pelvis and right lower quadrant. The patient was found to have an enlarged and inflamed appendix that was extending into the pelvis. There was no evidence of perforation.  The appendix was carefully dissected. A window was made in the mesoappendix at the base of the appendix. A harmonic scalpel was used across the mesoappendix. The appendix was divided at its base using an endo-GIA stapler. Minimal appendiceal stump was left in place. There was no evidence of leakage or complication after division of the appendix. Irrigation was also performed and irrigate suctioned from the abdomen as well. There was some oozing from the appendiceal stump controlled with pressure and application of fibrillar surgicel.   There were minimal intraabdominal adhesions.  There were a few left lower abdominal adhesions.  Four quadrant laparoscopy was performed and there was no evidence of injury. There was no other abnormality. The appendix was removed.   The umbilical port site was closed using 0 vicryl pursestring sutures fashion at the level of the fascia. The trocar site skin wounds were closed using 4 O monocryl..  Instrument, sponge, and  needle counts were correct at the conclusion  of the case.   Findings: The appendix was found to be inflamed. There were signs of necrosis.  There was not perforation. There was not abscess formation.  Estimated Blood Loss:  less than 50 mL         Drains: none         Total IV Fluids: per record         Specimens: appendix to pathology         Complications:  None; patient tolerated the procedure well.         Disposition: PACU - hemodynamically stable.         Condition: stable

## 2017-02-28 NOTE — Transfer of Care (Signed)
Immediate Anesthesia Transfer of Care Note  Patient: Kaitlyn GuntherKendall Bennett Johnson  Procedure(s) Performed: Procedure(s): APPENDECTOMY LAPAROSCOPIC (N/A)  Patient Location: PACU  Anesthesia Type:General  Level of Consciousness:  sedated, patient cooperative and responds to stimulation  Airway & Oxygen Therapy:Patient Spontanous Breathing and Patient connected to face mask oxgen  Post-op Assessment:  Report given to PACU RN and Post -op Vital signs reviewed and stable  Post vital signs:  Reviewed and stable  Last Vitals:  Vitals:   02/28/17 1837 02/28/17 2118  BP: 102/61 (!) 141/74  Pulse: 91 96  Resp: 16 18  Temp: 37 C 36.8 C    Complications: No apparent anesthesia complications

## 2017-03-01 ENCOUNTER — Encounter (HOSPITAL_COMMUNITY): Payer: Self-pay | Admitting: Surgery

## 2017-03-01 LAB — BASIC METABOLIC PANEL
ANION GAP: 11 (ref 5–15)
BUN: 9 mg/dL (ref 6–20)
CHLORIDE: 104 mmol/L (ref 101–111)
CO2: 21 mmol/L — ABNORMAL LOW (ref 22–32)
CREATININE: 0.86 mg/dL (ref 0.44–1.00)
Calcium: 8.4 mg/dL — ABNORMAL LOW (ref 8.9–10.3)
GFR calc non Af Amer: >60 mL/min (ref >60)
Glucose, Bld: 162 mg/dL — ABNORMAL HIGH (ref 65–99)
POTASSIUM: 3.8 mmol/L (ref 3.5–5.1)
SODIUM: 136 mmol/L (ref 135–145)

## 2017-03-01 LAB — CBC
HCT: 37.2 % (ref 36.0–46.0)
HEMOGLOBIN: 12.3 g/dL (ref 12.0–15.0)
MCH: 30.6 pg (ref 26.0–34.0)
MCHC: 33.1 g/dL (ref 30.0–36.0)
MCV: 92.5 fL (ref 78.0–100.0)
Platelets: 212 10*3/uL (ref 150–400)
RBC: 4.02 MIL/uL (ref 3.87–5.11)
RDW: 12.9 % (ref 11.5–15.5)
WBC: 11.7 10*3/uL — AB (ref 4.0–10.5)

## 2017-03-01 MED ORDER — OXYCODONE-ACETAMINOPHEN 5-325 MG PO TABS: 1.0000 | ORAL_TABLET | Freq: Four times a day (QID) | ORAL | 0 refills | Status: DC | PRN

## 2017-03-01 MED ORDER — ONDANSETRON 4 MG PO TBDP: 4.0000 mg | ORAL_TABLET | Freq: Four times a day (QID) | ORAL | 0 refills | Status: DC | PRN

## 2017-03-01 NOTE — Anesthesia Postprocedure Evaluation (Signed)
Anesthesia Post Note  Patient: Kaitlyn Johnson  Procedure(s) Performed: Procedure(s) (LRB): APPENDECTOMY LAPAROSCOPIC (N/A)  Patient location during evaluation: PACU Anesthesia Type: General Level of consciousness: sedated and patient cooperative Pain management: pain level controlled Vital Signs Assessment: post-procedure vital signs reviewed and stable Respiratory status: spontaneous breathing Cardiovascular status: stable Anesthetic complications: no       Last Vitals:  Vitals:   02/28/17 2354 03/01/17 0005  BP: 112/72 115/69  Pulse: 90 84  Resp: 13 14  Temp: 36.5 C 36.8 C    Last Pain:  Vitals:   02/28/17 2354  TempSrc:   PainSc: 3                  Lewie LoronJohn Breanne Olvera

## 2017-03-01 NOTE — Discharge Instructions (Signed)
Please arrive at least 30 min before your appointment to complete your check in paperwork.  If you are unable to arrive 30 min prior to your appointment time we may have to cancel or reschedule you.  LAPAROSCOPIC SURGERY: POST OP INSTRUCTIONS  1. DIET: Follow a light bland diet the first 24 hours after arrival home, such as soup, liquids, crackers, etc. Be sure to include lots of fluids daily. Avoid fast food or heavy meals as your are more likely to get nauseated. Eat a low fat the next few days after surgery.  2. Take your usually prescribed home medications unless otherwise directed. 3. PAIN CONTROL:  1. Pain is best controlled by a usual combination of three different methods TOGETHER:  1. Ice/Heat 2. Over the counter pain medication 3. Prescription pain medication 2. Most patients will experience some swelling and bruising around the incisions. Ice packs or heating pads (30-60 minutes up to 6 times a day) will help. Use ice for the first few days to help decrease swelling and bruising, then switch to heat to help relax tight/sore spots and speed recovery. Some people prefer to use ice alone, heat alone, alternating between ice & heat. Experiment to what works for you. Swelling and bruising can take several weeks to resolve.  3. It is helpful to take an over-the-counter pain medication regularly for the first few weeks. Choose one of the following that works best for you:  1. Naproxen (Aleve, etc) Two 280m tabs twice a day 2. Ibuprofen (Advil, etc) Three 2028mtabs four times a day (every meal & bedtime) 3. Acetaminophen (Tylenol, etc) 500-65034mour times a day (every meal & bedtime) 4. A prescription for pain medication (such as oxycodone, hydrocodone, etc) should be given to you upon discharge. Take your pain medication as prescribed.  1. If you are having problems/concerns with the prescription medicine (does not control pain, nausea, vomiting, rash, itching, etc), please call us [phone])  [phone] to see if we need to switch you to a different pain medicine that will work better for you and/or control your side effect better. 2. If you need a refill on your pain medication, please contact your pharmacy. They will contact our office to request authorization. Prescriptions will not be filled after 5 pm or on week-ends. 4. Avoid getting constipated. Between the surgery and the pain medications, it is common to experience some constipation. Increasing fluid intake and taking a fiber supplement (such as Metamucil, Citrucel, FiberCon, MiraLax, etc) 1-2 times a day regularly will usually help prevent this problem from occurring. A mild laxative (prune juice, Milk of Magnesia, MiraLax, etc) should be taken according to package directions if there are no bowel movements after 48 hours.  5. Watch out for diarrhea. If you have many loose bowel movements, simplify your diet to bland foods & liquids for a few days. Stop any stool softeners and decrease your fiber supplement. Switching to mild anti-diarrheal medications (Kayopectate, Pepto Bismol) can help. If this worsens or does not improve, please call us.[place]. Wash / shower every day. You may shower over the dressings as they are waterproof. Continue to shower over incision(s) after the dressing is off. If there is glue over the incisions try not to pick it off, let it fall off naturally. 7. Remove your waterproof bandages 2 days after surgery. You may leave the incision open to air. You may replace a dressing/Band-Aid to cover the incision for comfort if you wish.  8. ACTIVITIES as tolerated:  1. You may resume regular (light) daily activities beginning the next day--such as daily self-care, walking, climbing stairs--gradually increasing activities as tolerated. If you can walk 30 minutes without difficulty, it is safe to try more intense activity such as jogging, treadmill, bicycling, low-impact aerobics, swimming, etc. 2. Save the most intensive and  strenuous activity for last such as sit-ups, heavy lifting, contact sports, etc Refrain from any heavy lifting or straining until you are off narcotics for pain control. For the first 2-3 weeks do not lift over 10-15lb.  3. DO NOT PUSH THROUGH PAIN. Let pain be your guide: If it hurts to do something, don't do it. Pain is your body warning you to avoid that activity for another week until the pain goes down. 4. You may drive when you are no longer taking prescription pain medication, you can comfortably wear a seatbelt, and you can safely maneuver your car and apply brakes. 5. You may have sexual intercourse when it is comfortable.  9. FOLLOW UP in our office  1. Please call [facility] at [phone] to set up an appointment to see your surgeon in the office for a follow-up appointment approximately 2-3 weeks after your surgery. 2. Make sure that you call for this appointment the day you arrive home to insure a convenient appointment time.      10. IF YOU HAVE DISABILITY OR FAMILY LEAVE FORMS, BRING THEM TO THE               OFFICE FOR PROCESSING.   WHEN TO CALL us [phone]:  1. Poor pain control 2. Reactions / problems with new medications (rash/itching, nausea, etc)  3. Fever over 101.5 F (38.5 C) 4. Inability to urinate 5. Nausea and/or vomiting 6. Worsening swelling or bruising 7. Continued bleeding from incision. 8. Increased pain, redness, or drainage from the incision  The clinic staff is available to answer your questions during regular business hours (8:30am-5pm). Please dont hesitate to call and ask to speak to one of our nurses for clinical concerns.  If you have a medical emergency, go to the nearest emergency room or call 911.  A surgeon from [facility] is always on call at the [facility], [place], [place], [place], [place] [address] ?  MAIN: [phone] ? TOLL FREE: [phone] ?  FAX [phone]    [email or web address]

## 2017-03-01 NOTE — Progress Notes (Signed)
Patient tolerated breakfast well (eggs) with no N/V. Reviewed discharge information with patient and caregiver. Answered all questions. Patient/caregiver able to teach back medications and reasons to contact MD/911. Patient verbalizes importance of PCP follow up appointment.  Earnest ConroyBrooke M. Clelia CroftShaw, RN

## 2017-03-01 NOTE — Discharge Summary (Signed)
[facility] Discharge Summary   Patient ID: Kaitlyn Johnson MRN: [id number] DOB/AGE: [birth year] 36 y.o.  Admit date: 02/28/2017 Discharge date: 03/01/2017  Admitting Diagnosis: Acute appendicitis   Discharge Diagnosis Patient Active Problem List   Diagnosis Date Noted  . Acute appendicitis 02/28/2017  . HNP (herniated nucleus pulposus), lumbar 06/22/2016  . Nausea with vomiting 11/30/2013  . Abdominal pain, epigastric 11/30/2013  . Dehydration 11/30/2013  . Acute pancreatitis 11/30/2013  . Pancreatitis 11/29/2013  . Gastroenteritis due to norovirus 11/26/2013  . Hypomagnesemia 11/25/2013  . Nausea vomiting and diarrhea 11/23/2013  . Anemia of chronic disease 11/23/2013  . Hypokalemia 11/23/2013  . Hyponatremia 11/23/2013  . Nausea and vomiting 11/23/2013  . Iatrogenic Cushing's syndrome ([place]) 10/02/2013  . Endometriosis 09/01/2013  . Celiac disease 09/01/2013  . Asthma, chronic 09/01/2013  . 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 POTS (postural orthostatic tachycardia syndrome) 11/26/2011    Consultants None  Imaging: Ct Abdomen Pelvis W Contrast  Result Date: 02/28/2017 CLINICAL DATA:  Right lower quadrant pain. Prior history of Roux-en-Y. EXAM: CT ABDOMEN AND PELVIS WITH CONTRAST TECHNIQUE: Multidetector CT imaging of the abdomen and pelvis was performed using the standard protocol following bolus administration of intravenous contrast. CONTRAST:  159m ISOVUE-300 IOPAMIDOL (ISOVUE-300) INJECTION 61% COMPARISON:  12/31/2016 FINDINGS: Lower chest: Lung bases are clear. Hepatobiliary: No focal liver abnormality is seen. Status post cholecystectomy. No biliary dilatation. Pancreas: Unremarkable. No pancreatic ductal dilatation or surrounding inflammatory changes. Spleen: Normal in size without focal abnormality. Adrenals/Urinary Tract: Adrenal glands are unremarkable. Kidneys are normal, without renal calculi, focal lesion, or  hydronephrosis. 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 Bladder is unremarkable. Stomach/Bowel: Prior Roux-en-Y surgery. Postsurgical changes noted in the upper abdomen. No evidence of bowel wall thickening, distention, or inflammatory changes. Dilated appendix measuring 12 mm with periappendiceal inflammatory changes most concerning for mild acute appendicitis. Vascular/Lymphatic: No significant vascular findings are present. No enlarged abdominal or pelvic lymph nodes. Reproductive: Status post hysterectomy. No adnexal masses. Other: No abdominal wall hernia or abnormality. No abdominopelvic ascites. Musculoskeletal: No acute osseous abnormality. No lytic or sclerotic osseous lesion. Abnormal articulation of the right L5 transverse process with the sacrum. IMPRESSION: 1. Findings most consistent with acute appendicitis. No focal fluid collection to suggest an abscess. Electronically Signed   By: HKathreen Devoid  On: 02/28/2017 13:33    Procedures Dr. CBrantley Stage(02/28/17) - Laparoscopic Appendectomy  Hospital Course:  KElzora Cullinsis a [age]yofemale with a complex surgical history [facility] days of RLQ abdominal pain.  Workup showed leukocytosis and acute appendicitis.  Patient was admitted and underwent procedure listed above.  Tolerated procedure well and was transferred to the floor.  Diet was advanced as tolerated.  On POD1 the patient was voiding well, tolerating diet, ambulating well, pain well controlled, vital signs stable, incisions c/d/i and felt stable for discharge home.  Patient will follow up in our office in 2-3 weeks and knows to call with questions or concerns.  She will call to confirm appointment date/time.    I have personally reviewed the patients medication history on the [place] controlled substance database.    Physical Exam: General:  Alert, NAD, pleasant, comfortable Pulm: effort normal, CTAB Cardio:  RRR Abd:  Soft, ND, appropriately tender, multiple lap incisions C/D/I, +BS  Allergies as of 03/01/2017      Reactions   Bee Venom Anaphylaxis   Reglan [metoclopramide]  Other (See Comments)   Reaction:  Oculogyric crisis    Shellfish Allergy Anaphylaxis   Wheat Bran Other (See Comments)   Pt has celiac disease.     Adhesive [tape] Hives   Gluten Meal Other (See Comments)   Pt has celiac disease.    Sulfa Antibiotics Rash   Tartrazine Rash   Yellow Dye Rash      Medication List    TAKE these medications   AMBIEN CR 12.5 MG CR tablet Generic drug:  zolpidem Take 12.5 mg by mouth at bedtime.   celecoxib 200 MG capsule Commonly known as:  CELEBREX Take 200 mg by mouth 2 (two) times daily.   clonazePAM 1 MG tablet Commonly known as:  KLONOPIN Take 1 tablet (1 mg total) by mouth 2 (two) times daily as needed for anxiety.   cyanocobalamin 1000 MCG/ML injection Commonly known as:  (VITAMIN B-12) Inject 1,000 mcg into the skin every 30 (thirty) days.   diltiazem 180 MG 24 hr capsule Commonly known as:  CARDIZEM CD Take 180 mg by mouth daily.   DULoxetine 30 MG capsule Commonly known as:  CYMBALTA Take 30 mg by mouth daily.   EPIPEN 0.3 mg/0.3 mL Soaj injection Generic drug:  EPINEPHrine Inject 0.3 mg into the muscle once as needed (for severe allergic reaction).   hydrOXYzine 10 MG tablet Commonly known as:  ATARAX/VISTARIL Take 10 mg by mouth daily.   LAMICTAL 100 MG tablet Generic drug:  lamoTRIgine Take 100 mg by mouth at bedtime.   LATUDA 60 MG Tabs Generic drug:  Lurasidone HCl Take 60 mg by mouth at bedtime.   levalbuterol 0.31 MG/3ML nebulizer solution Commonly known as:  XOPENEX Take 1 ampule by nebulization every 4 (four) hours as needed for wheezing.   metoprolol succinate 50 MG 24 hr tablet Commonly known as:  TOPROL-XL Take 50 mg by mouth 2 (two) times daily.   montelukast 10 MG tablet Commonly known as:  SINGULAIR Take 10 mg by mouth at  bedtime.   omeprazole 20 MG capsule Commonly known as:  PRILOSEC Take 1 capsule (20 mg total) by mouth daily.   ondansetron 4 MG disintegrating tablet Commonly known as:  ZOFRAN-ODT Take 1 tablet (4 mg total) by mouth every 6 (six) hours as needed for nausea.   oxyCODONE-acetaminophen 5-325 MG tablet Commonly known as:  PERCOCET/ROXICET Take 1 tablet by mouth every 6 (six) hours as needed for moderate pain.   PROAIR HFA 108 (90 Base) MCG/ACT inhaler Generic drug:  albuterol Inhale 1-2 puffs into the lungs every 6 (six) hours as needed for wheezing or shortness of breath.   traZODone 100 MG tablet Commonly known as:  DESYREL Take 100 mg by mouth at bedtime.        Follow-up Information    Eloise Levels, NP Follow up.   Specialty:  Nurse Practitioner Contact information: [phone] [address] [address] [phone]        [facility], [place]. Call.   Specialty:  General Surgery Why:  We are working on your appointment, please call to confirm. Contact information: [address] [place] [phone]          Signed: Jerrye Beavers, [facility] 03/01/2017, 7:48 AM Pager: [phone] Consults: [phone] Mon-Fri 7:00 am-4:30 pm Sat-Sun 7:00 am-11:30 am

## 2017-03-01 NOTE — Progress Notes (Signed)
Returned from PACU alert and oriented.

## 2017-03-06 ENCOUNTER — Encounter (HOSPITAL_COMMUNITY): Payer: Self-pay | Admitting: Surgery

## 2017-03-18 DIAGNOSIS — F411 Generalized anxiety disorder: Secondary | ICD-10-CM | POA: Diagnosis not present

## 2017-03-18 DIAGNOSIS — F605 Obsessive-compulsive personality disorder: Secondary | ICD-10-CM | POA: Diagnosis not present

## 2017-03-18 DIAGNOSIS — F331 Major depressive disorder, recurrent, moderate: Secondary | ICD-10-CM | POA: Diagnosis not present

## 2017-04-12 DIAGNOSIS — R635 Abnormal weight gain: Secondary | ICD-10-CM | POA: Diagnosis not present

## 2017-04-22 DIAGNOSIS — R000 Tachycardia, unspecified: Secondary | ICD-10-CM | POA: Diagnosis not present

## 2017-04-22 DIAGNOSIS — I951 Orthostatic hypotension: Secondary | ICD-10-CM | POA: Diagnosis not present

## 2017-05-17 DIAGNOSIS — R635 Abnormal weight gain: Secondary | ICD-10-CM | POA: Diagnosis not present

## 2017-05-19 DIAGNOSIS — R635 Abnormal weight gain: Secondary | ICD-10-CM | POA: Diagnosis not present

## 2017-05-20 DIAGNOSIS — R635 Abnormal weight gain: Secondary | ICD-10-CM | POA: Diagnosis not present

## 2017-06-09 DIAGNOSIS — R635 Abnormal weight gain: Secondary | ICD-10-CM | POA: Diagnosis not present

## 2017-06-10 DIAGNOSIS — R635 Abnormal weight gain: Secondary | ICD-10-CM | POA: Diagnosis not present

## 2017-06-18 DIAGNOSIS — F411 Generalized anxiety disorder: Secondary | ICD-10-CM | POA: Diagnosis not present

## 2017-07-09 ENCOUNTER — Emergency Department (HOSPITAL_COMMUNITY)
Admission: EM | Admit: 2017-07-09 | Discharge: 2017-07-09 | Disposition: A | Discharge status: Home / Self Care | Payer: BLUE CROSS/BLUE SHIELD | Attending: Emergency Medicine | Admitting: Emergency Medicine

## 2017-07-09 ENCOUNTER — Encounter (HOSPITAL_COMMUNITY): Payer: Self-pay | Admitting: Emergency Medicine

## 2017-07-09 DIAGNOSIS — Z91013 Allergy to seafood: Secondary | ICD-10-CM | POA: Diagnosis not present

## 2017-07-09 DIAGNOSIS — J45909 Unspecified asthma, uncomplicated: Secondary | ICD-10-CM | POA: Diagnosis not present

## 2017-07-09 DIAGNOSIS — Z79899 Other long term (current) drug therapy: Secondary | ICD-10-CM | POA: Diagnosis not present

## 2017-07-09 DIAGNOSIS — T7840XA Allergy, unspecified, initial encounter: Secondary | ICD-10-CM

## 2017-07-09 MED ORDER — SODIUM CHLORIDE 0.9 % IV BOLUS (SEPSIS)
1000.0000 mL | Freq: Once | INTRAVENOUS | Status: AC
Start: 2017-07-09 — End: 2017-07-09
  Administered 2017-07-09: 1000 mL via INTRAVENOUS

## 2017-07-09 MED ORDER — EPINEPHRINE 0.3 MG/0.3ML IJ SOAJ: 0.3000 mg | Freq: Once | INTRAMUSCULAR | 2 refills | Status: DC | PRN

## 2017-07-09 MED ORDER — FAMOTIDINE IN NACL 20-0.9 MG/50ML-% IV SOLN
20.0000 mg | INTRAVENOUS | Status: AC
  Administered 2017-07-09: 20 mg via INTRAVENOUS
  Filled 2017-07-09: qty 50

## 2017-07-09 MED ORDER — DIPHENHYDRAMINE HCL 50 MG/ML IJ SOLN
25.0000 mg | Freq: Once | INTRAMUSCULAR | Status: AC
  Administered 2017-07-09: 25 mg via INTRAVENOUS
  Filled 2017-07-09: qty 1

## 2017-07-09 MED ORDER — EPINEPHRINE 0.3 MG/0.3ML IJ SOAJ
0.3000 mg | Freq: Once | INTRAMUSCULAR | Status: AC
  Administered 2017-07-09: 0.3 mg via INTRAMUSCULAR
  Filled 2017-07-09: qty 0.3

## 2017-07-09 MED ORDER — METHYLPREDNISOLONE SODIUM SUCC 125 MG IJ SOLR
125.0000 mg | Freq: Once | INTRAMUSCULAR | Status: AC
  Administered 2017-07-09: 125 mg via INTRAVENOUS
  Filled 2017-07-09: qty 2

## 2017-07-09 NOTE — ED Notes (Signed)
No throat swelling noted. Pt verbalizes "I feel better."

## 2017-07-09 NOTE — ED Provider Notes (Signed)
[facility]-EMERGENCY DEPT Provider Note   CSN: [id number] Arrival date & time: 07/09/17  1511     History   Chief Complaint Chief Complaint  Patient presents with  . Allergic Reaction    HPI Kaitlyn Johnson is a 36 y.o. female.  The history is provided by the patient and medical records.  Allergic Reaction  Presenting symptoms: rash     36 y.o. F with hx of anemia, anxiety, asthma, celiac disease, depression, POTS, von willebrand's disease, presenting to the ED for allergic reaction.  Patient states she has significant allergy to shellfish and bees.  States usually she can get salmon, tuna, and other fish without issue.  States today she went by [employer] to get some water and picked up some sushi which she ate.  States afterwards she started feeling flushed, tingling sensation of her lips, tongue, and throat.    Denies chest pain or SOB.  States she is concerned that her food may have been mislabeled or some cross contamination.  No intervention tried PTA.  Patients face is flushed on arrival.  She is speak in full sentences without issue.    Past Medical History:  Diagnosis Date  . Anemia 11/26/2011  . Anxiety   . Asthma   . Celiac and mesenteric artery injury   . Cushing's syndrome (HCC)    06/21/16- "in remission"  . Depression   . H/O hiatal hernia   . History of kidney stones   . POTS (postural orthostatic tachycardia syndrome)   . Shortness of breath dyspnea    with exertertion  . Sphincter of Oddi dysfunction   . Von Willebrand disease (HCC)    bruising easy    Patient Active Problem List   Diagnosis Date Noted  . Acute appendicitis 02/28/2017  . HNP (herniated nucleus pulposus), lumbar 06/22/2016  . Nausea with vomiting 11/30/2013  . Abdominal pain, epigastric 11/30/2013  . Dehydration 11/30/2013  . Acute pancreatitis 11/30/2013  . Pancreatitis 11/29/2013  . Gastroenteritis due to norovirus 11/26/2013  . Hypomagnesemia 11/25/2013  . Nausea vomiting  and diarrhea 11/23/2013  . Anemia of chronic disease 11/23/2013  . Hypokalemia 11/23/2013  . Hyponatremia 11/23/2013  . Nausea and vomiting 11/23/2013  . Iatrogenic Cushing's syndrome (HCC) 10/02/2013  . Endometriosis 09/01/2013  . Celiac disease 09/01/2013  . Asthma, chronic 09/01/2013  . 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 POTS (postural orthostatic tachycardia syndrome) 11/26/2011    Past Surgical History:  Procedure Laterality Date  . ABDOMINAL HYSTERECTOMY  08/2010  . ANTERIOR CERVICAL DECOMP/DISCECTOMY FUSION N/A 03/31/2013   Procedure: ANTERIOR CERVICAL DECOMPRESSION/DISCECTOMY FUSION 1 LEVEL Cervical five-six;  Surgeon: Reinaldo Meeker, MD;  Location: [facility];  Service: Neurosurgery;  Laterality: N/A;  . Cholangio-Pancreatography with Spintectorotomy + Stent  07/16/2012   01/15/14  . CHOLECYSTECTOMY    . LAPAROSCOPIC APPENDECTOMY N/A 02/28/2017   Procedure: APPENDECTOMY LAPAROSCOPIC;  Surgeon: Harriette Bouillon, MD;  Location: [facility];  Service: General;  Laterality: N/A;  . LAPAROSCOPIC ENDOMETRIOSIS FULGURATION    . LUMBAR LAMINECTOMY    . LUMBAR LAMINECTOMY/DECOMPRESSION MICRODISCECTOMY Left 06/22/2016   Procedure: MICRODISCECTOMY LEFT LUMBAR FOUR-FIVE;  Surgeon: Lisbeth Renshaw, MD;  Location: [facility];  Service: Neurosurgery;  Laterality: Left;  . ROUX-EN-Y PROCEDURE  04/1999    OB History    No data available       Home Medications    Prior to Admission medications   Medication Sig Start Date End Date Taking? Authorizing Provider  albuterol (PROAIR  HFA) 108 (90 Base) MCG/ACT inhaler Inhale 1-2 puffs into the lungs every 6 (six) hours as needed for wheezing or shortness of breath.     [provider]  AMBIEN CR 12.5 MG CR tablet Take 12.5 mg by mouth at bedtime. 10/31/15   [provider]  celecoxib (CELEBREX) 200 MG capsule Take 200 mg by mouth 2 (two) times daily.    [provider]  clonazePAM (KLONOPIN) 1 MG  tablet Take 1 tablet (1 mg total) by mouth 2 (two) times daily as needed for anxiety. 12/07/13   Dorothea Ogle, MD  cyanocobalamin (,VITAMIN B-12,) 1000 MCG/ML injection Inject 1,000 mcg into the skin every 30 (thirty) days.     [provider]  diltiazem (CARDIZEM CD) 180 MG 24 hr capsule Take 180 mg by mouth daily.     [provider]  DULoxetine (CYMBALTA) 30 MG capsule Take 30 mg by mouth daily.    [provider]  EPINEPHrine (EPIPEN) 0.3 mg/0.3 mL SOAJ injection Inject 0.3 mg into the muscle once as needed (for severe allergic reaction).     [provider]  hydrOXYzine (ATARAX/VISTARIL) 10 MG tablet Take 10 mg by mouth daily.     [provider]  lamoTRIgine (LAMICTAL) 100 MG tablet Take 100 mg by mouth at bedtime.     [provider]  LATUDA 60 MG TABS Take 60 mg by mouth at bedtime.     [provider]  levalbuterol (XOPENEX) 0.31 MG/3ML nebulizer solution Take 1 ampule by nebulization every 4 (four) hours as needed for wheezing.    [provider]  metoprolol succinate (TOPROL-XL) 50 MG 24 hr tablet Take 50 mg by mouth 2 (two) times daily.     [provider]  montelukast (SINGULAIR) 10 MG tablet Take 10 mg by mouth at bedtime.    [provider]  omeprazole (PRILOSEC) 20 MG capsule Take 1 capsule (20 mg total) by mouth daily. 12/31/16   Demetrios Loll T, PA-C  ondansetron (ZOFRAN-ODT) 4 MG disintegrating tablet Take 1 tablet (4 mg total) by mouth every 6 (six) hours as needed for nausea. 03/01/17   Meuth, Lina Sar, PA-C  oxyCODONE-acetaminophen (PERCOCET/ROXICET) 5-325 MG tablet Take 1 tablet by mouth every 6 (six) hours as needed for moderate pain. 03/01/17   Meuth, Brooke A, PA-C  traZODone (DESYREL) 100 MG tablet Take 100 mg by mouth at bedtime.    [provider]    Family History Family History  Problem Relation Age of Onset  . Hypertension Mother     Social History Social History    Substance Use Topics  . Smoking status: Never Smoker  . Smokeless tobacco: Never Used  . Alcohol use 3.0 oz/week    5 Glasses of wine per week     Allergies   Bee venom; Reglan [metoclopramide]; Shellfish allergy; Wheat bran; Adhesive [tape]; Gluten meal; Sulfa antibiotics; Tartrazine; and Yellow dye   Review of Systems Review of Systems  Constitutional:       Allergic rxn  Skin: Positive for rash.  All other systems reviewed and are negative.    Physical Exam Updated Vital Signs BP (!) 145/96 (BP Location: Left Arm)   Pulse 89   Temp 98.2 F (36.8 C) (Oral)   Resp 20   Ht  (1.753 m) Comment: Simultaneous filing. User may not have seen previous data.  Wt 108 kg (238 lb) Comment: Simultaneous filing. User may not have seen previous data.  SpO2  98%   BMI 35.15 kg/m   Physical Exam  Constitutional: She is oriented to person, place, and time. She appears well-developed and well-nourished.  HENT:  Head: Normocephalic and atraumatic.  Mouth/Throat: Oropharynx is clear and moist.  Face appears flushed, splotchy rash noted to face and neck, rash blanches; no lip or tongue swelling; floor and roof of mouth appears normal; handling secretions well; no facial or neck swelling; normal phonation without stridor, speaking in full sentences without issue  Eyes: Pupils are equal, round, and reactive to light. Conjunctivae and EOM are normal.  Neck: Normal range of motion.  Cardiovascular: Normal rate, regular rhythm and normal heart sounds.   Pulmonary/Chest: Effort normal and breath sounds normal. No respiratory distress. She has no wheezes.  Abdominal: Soft. Bowel sounds are normal. There is no tenderness. There is no rebound.  Musculoskeletal: Normal range of motion.  Neurological: She is alert and oriented to person, place, and time.  Skin: Skin is warm and dry.  Psychiatric: She has a normal mood and affect.  Nursing note and vitals reviewed.    ED Treatments /  Results  Labs (all labs ordered are listed, but only abnormal results are displayed) Labs Reviewed - No data to display  EKG  EKG Interpretation None       Radiology No results found.  Procedures Procedures (including critical care time)  CRITICAL CARE Performed by: Garlon Hatchet   Total critical care time: 40 minutes  Critical care time was exclusive of separately billable procedures and treating other patients.  Critical care was necessary to treat or prevent imminent or life-threatening deterioration.  Critical care was time spent personally by me on the following activities: development of treatment plan with patient and/or surrogate as well as nursing, discussions with consultants, evaluation of patient's response to treatment, examination of patient, obtaining history from patient or surrogate, ordering and performing treatments and interventions, ordering and review of laboratory studies, ordering and review of radiographic studies, pulse oximetry and re-evaluation of patient's condition.   Medications Ordered in ED Medications  sodium chloride 0.9 % bolus 1,000 mL (0 mLs Intravenous Stopped 07/09/17 1634)  diphenhydrAMINE (BENADRYL) injection 25 mg (25 mg Intravenous Given 07/09/17 1534)  famotidine (PEPCID) IVPB 20 mg premix (0 mg Intravenous Stopped 07/09/17 1604)  methylPREDNISolone sodium succinate (SOLU-MEDROL) 125 mg/2 mL injection 125 mg (125 mg Intravenous Given 07/09/17 1534)  EPINEPHrine (EPI-PEN) injection 0.3 mg (0.3 mg Intramuscular Given 07/09/17 1625)     Initial Impression / Assessment and Plan / ED Course  I have reviewed the triage vital signs and the nursing notes.  Pertinent labs & imaging results that were available during my care of the patient were reviewed by me and considered in my medical decision making (see chart for details).  36 year old female here with allergic reaction after eating sushi. She has a shellfish allergy, but states  usually no issues eating salmon or tuna. Question contamination versus mislabeled food. She reports tingling and flushing sensation of her face, lips, and tongue. She is not having any difficulty swallowing at this time. No oropharyngeal swelling. Normal phonation without stridor. Will plan for IV fluids, Solu-Medrol, Benadryl, Pepcid. Will monitor closely.  3:59 PM Patient re-checked as she stated she was feeling dizzy/lightheaded.  She just got meds about 10 mins ago.  Throat is unchanged in appearance.  She continues talking normally, no difficulty handling secretions at this time.  Will monitor closely.  4:21 PM Patient re-checked-- she is starting to get some  edema of the posterior oropharynx.  Still handling secretions well, speaking normal without stridor.  States she has hx of cushing's, steroids are usually hit or miss.  Will give IM epi. Patient understands this will require period of observation.  7:20 PM Patient has reached 3 hour mark post epi.  States she is feeling significantly better.  Oropharynx appears normal without visible swelling. She continues handling secretions well. No trouble swallowing. Has been able to drink fluids here without issue. Patient stable for discharge. I've refilled her EpiPen. Recommended to keep this with her at all times.  Follow-up with PCP ongoing issues.  Discussed plan with patient, she acknowledged understanding and agreed with plan of care.  Return precautions given for new or worsening symptoms.  Final Clinical Impressions(s) / ED Diagnoses   Final diagnoses:  Allergic reaction, initial encounter    New Prescriptions Discharge Medication List as of 07/09/2017  7:23 PM       Garlon HatchetSanders, Annslee Tercero M, PA-C 07/09/17 2009    Bethann BerkshireZammit, Joseph, MD 07/09/17 2310

## 2017-07-09 NOTE — ED Notes (Signed)
Pt complaint of "throat feels worse." Swelling noted to throat. Pt VS WNL and lung sounds clear.

## 2017-07-09 NOTE — ED Triage Notes (Signed)
Patient reports shellfish allergy and ate seafood while ago. Patient c/o throat feeling tight. Normally epi pen in past but dont have on her

## 2017-07-09 NOTE — Discharge Instructions (Signed)
Keep Epipen on hand.  If used, you need to come to the ED for evaluation and monitoring. Follow-up with your primary care doctor. Return to the ED for new or worsening symptoms.

## 2017-07-17 ENCOUNTER — Emergency Department (HOSPITAL_BASED_OUTPATIENT_CLINIC_OR_DEPARTMENT_OTHER)
Admission: EM | Admit: 2017-07-17 | Discharge: 2017-07-17 | Disposition: A | Discharge status: Home / Self Care | Payer: BLUE CROSS/BLUE SHIELD | Attending: Emergency Medicine | Admitting: Emergency Medicine

## 2017-07-17 ENCOUNTER — Encounter (HOSPITAL_BASED_OUTPATIENT_CLINIC_OR_DEPARTMENT_OTHER): Payer: Self-pay

## 2017-07-17 DIAGNOSIS — Z79899 Other long term (current) drug therapy: Secondary | ICD-10-CM | POA: Diagnosis not present

## 2017-07-17 DIAGNOSIS — T7840XA Allergy, unspecified, initial encounter: Secondary | ICD-10-CM | POA: Insufficient documentation

## 2017-07-17 DIAGNOSIS — J45909 Unspecified asthma, uncomplicated: Secondary | ICD-10-CM | POA: Insufficient documentation

## 2017-07-17 MED ORDER — SODIUM CHLORIDE 0.9 % IV BOLUS (SEPSIS)
1000.0000 mL | Freq: Once | INTRAVENOUS | Status: AC
  Administered 2017-07-17: 1000 mL via INTRAVENOUS

## 2017-07-17 MED ORDER — EPINEPHRINE 0.3 MG/0.3ML IJ SOAJ: 0.3000 mg | Freq: Once | INTRAMUSCULAR | 0 refills | Status: AC

## 2017-07-17 MED ORDER — DIPHENHYDRAMINE HCL 25 MG PO CAPS: 25.0000 mg | ORAL_CAPSULE | Freq: Four times a day (QID) | ORAL | 0 refills | Status: DC | PRN

## 2017-07-17 MED ORDER — METHYLPREDNISOLONE SODIUM SUCC 125 MG IJ SOLR
125.0000 mg | Freq: Once | INTRAMUSCULAR | Status: AC
  Administered 2017-07-17: 125 mg via INTRAVENOUS
  Filled 2017-07-17: qty 2

## 2017-07-17 MED ORDER — FAMOTIDINE IN NACL 20-0.9 MG/50ML-% IV SOLN
20.0000 mg | Freq: Once | INTRAVENOUS | Status: AC
  Administered 2017-07-17: 20 mg via INTRAVENOUS
  Filled 2017-07-17: qty 50

## 2017-07-17 MED ORDER — FAMOTIDINE 40 MG PO TABS: 40.0000 mg | ORAL_TABLET | Freq: Every day | ORAL | 0 refills | Status: DC

## 2017-07-17 NOTE — ED Triage Notes (Signed)
Pt states she ate a sauce that was cross contaminated with shellfish approx 215-started feeling throat tight, eyes itching, hives, took benadryl -epi pen x 2-sates throat still feel tigtht-NAD

## 2017-07-17 NOTE — Discharge Instructions (Signed)
Take Pepcid as directed.  Take Benadryl as directed.  Use her EpiPen as needed.  Follow-up with her primary care doctor next 24-48 hours for further evaluation.  Return the emergency department for any chest pain, difficulty breathing, feeling like her throat is closing or swelling up, facial swelling, rash or any other worsening or concerning symptoms.

## 2017-07-17 NOTE — ED Provider Notes (Signed)
[facility]   CSN: [id number] Arrival date & time: 07/17/17  1521     History   Chief Complaint Chief Complaint  Patient presents with  . Allergic Reaction    HPI Kaitlyn Johnson is a 36 y.o. female with known shellfish allergy who presents with potential allergic reaction that occurred approximately 2:15. Patient reports that she ate buffalo wings at a restaurant this afternoon. Patient states that when she was driving home, she started feeling like her throat was closing up and becoming tight and started with hives. Patient called the restaurant who reported that the same sauce of these for her lunch, they use for shrimp. Patient took 50 mg of Benadryl prior to ED arrival. She also used her EpiPen 2. Patient reports slight improvement in symptoms but states that she still feels like her throat is tight. She denies any chest pain, difficulty breathing, abdominal pain, nausea/vomiting, oral angioedema.  The history is provided by the patient.    Past Medical History:  Diagnosis Date  . Anemia 11/26/2011  . Anxiety   . Asthma   . Celiac and mesenteric artery injury   . Cushing's syndrome (HCC)    06/21/16- "in remission"  . Depression   . H/O hiatal hernia   . History of kidney stones   . POTS (postural orthostatic tachycardia syndrome)   . Shortness of breath dyspnea    with exertertion  . Sphincter of Oddi dysfunction   . Von Willebrand disease (HCC)    bruising easy    Patient Active Problem List   Diagnosis Date Noted  . Acute appendicitis 02/28/2017  . HNP (herniated nucleus pulposus), lumbar 06/22/2016  . Nausea with vomiting 11/30/2013  . Abdominal pain, epigastric 11/30/2013  . Dehydration 11/30/2013  . Acute pancreatitis 11/30/2013  . Pancreatitis 11/29/2013  . Gastroenteritis due to norovirus 11/26/2013  . Hypomagnesemia 11/25/2013  . Nausea vomiting and diarrhea 11/23/2013  . Anemia of chronic disease 11/23/2013  .  Hypokalemia 11/23/2013  . Hyponatremia 11/23/2013  . Nausea and vomiting 11/23/2013  . Iatrogenic Cushing's syndrome (HCC) 10/02/2013  . Endometriosis 09/01/2013  . Celiac disease 09/01/2013  . Asthma, chronic 09/01/2013  . 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 POTS (postural orthostatic tachycardia syndrome) 11/26/2011    Past Surgical History:  Procedure Laterality Date  . ABDOMINAL HYSTERECTOMY  08/2010  . ANTERIOR CERVICAL DECOMP/DISCECTOMY FUSION N/A 03/31/2013   Procedure: ANTERIOR CERVICAL DECOMPRESSION/DISCECTOMY FUSION 1 LEVEL Cervical five-six;  Surgeon: Reinaldo Meeker, MD;  Location: [facility];  Service: Neurosurgery;  Laterality: N/A;  . Cholangio-Pancreatography with Spintectorotomy + Stent  07/16/2012   01/15/14  . CHOLECYSTECTOMY    . LAPAROSCOPIC APPENDECTOMY N/A 02/28/2017   Procedure: APPENDECTOMY LAPAROSCOPIC;  Surgeon: Harriette Bouillon, MD;  Location: [facility];  Service: General;  Laterality: N/A;  . LAPAROSCOPIC ENDOMETRIOSIS FULGURATION    . LUMBAR LAMINECTOMY    . LUMBAR LAMINECTOMY/DECOMPRESSION MICRODISCECTOMY Left 06/22/2016   Procedure: MICRODISCECTOMY LEFT LUMBAR FOUR-FIVE;  Surgeon: Lisbeth Renshaw, MD;  Location: [facility];  Service: Neurosurgery;  Laterality: Left;  . ROUX-EN-Y PROCEDURE  04/1999    OB History    No data available       Home Medications    Prior to Admission medications   Medication Sig Start Date End Date Taking? Authorizing Provider  albuterol (PROAIR HFA) 108 (90 Base) MCG/ACT inhaler Inhale 1-2 puffs into the lungs every 6 (six) hours as needed for wheezing or shortness of breath.  [provider]  AMBIEN CR 12.5 MG CR tablet Take 12.5 mg by mouth at bedtime. 10/31/15   [provider]  celecoxib (CELEBREX) 200 MG capsule Take 200 mg by mouth 2 (two) times daily.    [provider]  clonazePAM (KLONOPIN) 1 MG tablet Take 1 tablet (1 mg total) by mouth 2 (two) times daily as  needed for anxiety. Patient not taking: Reported on 07/09/2017 12/07/13   Dorothea Ogle, MD  cyanocobalamin (,VITAMIN B-12,) 1000 MCG/ML injection Inject 1,000 mcg into the skin every 30 (thirty) days.     [provider]  diltiazem (CARDIZEM CD) 180 MG 24 hr capsule Take 180 mg by mouth daily.     [provider]  diphenhydrAMINE (BENADRYL) 25 mg capsule Take 1 capsule (25 mg total) by mouth every 6 (six) hours as needed. 07/17/17 07/22/17  Maxwell Caul, PA-C  DULoxetine (CYMBALTA) 30 MG capsule Take 30 mg by mouth daily.    [provider]  famotidine (PEPCID) 40 MG tablet Take 1 tablet (40 mg total) by mouth daily. 07/17/17 07/22/17  Graciella Freer A, PA-C  hydrOXYzine (ATARAX/VISTARIL) 10 MG tablet Take 10 mg by mouth daily.     [provider]  lamoTRIgine (LAMICTAL) 100 MG tablet Take 100 mg by mouth at bedtime.     [provider]  LATUDA 60 MG TABS Take 60 mg by mouth at bedtime.     [provider]  levalbuterol (XOPENEX) 0.31 MG/3ML nebulizer solution Take 1 ampule by nebulization every 4 (four) hours as needed for wheezing.    [provider]  metoprolol succinate (TOPROL-XL) 50 MG 24 hr tablet Take 50 mg by mouth 2 (two) times daily.     [provider]  montelukast (SINGULAIR) 10 MG tablet Take 10 mg by mouth at bedtime.    [provider]  omeprazole (PRILOSEC) 20 MG capsule Take 1 capsule (20 mg total) by mouth daily. Patient not taking: Reported on 07/09/2017 12/31/16   Demetrios Loll T, PA-C  ondansetron (ZOFRAN-ODT) 4 MG disintegrating tablet Take 1 tablet (4 mg total) by mouth every 6 (six) hours as needed for nausea. Patient not taking: Reported on 07/09/2017 03/01/17   Franne Forts, PA-C  oxyCODONE-acetaminophen (PERCOCET/ROXICET) 5-325 MG tablet Take 1 tablet by mouth every 6 (six) hours as needed for moderate pain. Patient not taking: Reported on 07/09/2017 03/01/17   Carlena Bjornstad A, PA-C    traZODone (DESYREL) 100 MG tablet Take 100 mg by mouth at bedtime.    [provider]    Family History Family History  Problem Relation Age of Onset  . Hypertension Mother     Social History Social History  Substance Use Topics  . Smoking status: Never Smoker  . Smokeless tobacco: Never Used  . Alcohol use 3.0 oz/week    5 Glasses of wine per week     Allergies   Bee venom; Reglan [metoclopramide]; Shellfish allergy; Wheat bran; Adhesive [tape]; Gluten meal; Sulfa antibiotics; Tartrazine; and Yellow dye   Review of Systems Review of Systems  HENT: Positive for sore throat.   Respiratory: Negative for shortness of breath.   Cardiovascular: Negative for chest pain.  Skin: Negative for rash.     Physical Exam Updated Vital Signs BP 126/81   Pulse 97   Temp 97.9 F (36.6 C) (Oral)   Resp 18   SpO2 96%   Physical Exam  Constitutional: She is oriented to person, place, and time. She  appears well-developed and well-nourished.  Appears anxious but no acute distress  HENT:  Head: Normocephalic and atraumatic.  Mouth/Throat: Uvula is midline, oropharynx is clear and moist and mucous membranes are normal. No trismus in the jaw.  No facial or neck swelling. No evidence of rash. No oral angioedema. Airway is patent. Normal phonation.  Eyes: Pupils are equal, round, and reactive to light. Conjunctivae, EOM and lids are normal. Right eye exhibits no discharge. Left eye exhibits no discharge. No scleral icterus.  Neck: Full passive range of motion without pain.  Cardiovascular: Regular rhythm, normal heart sounds and normal pulses.  Tachycardia present.  Exam reveals no gallop and no friction rub.   No murmur heard. Pulmonary/Chest: Effort normal and breath sounds normal. No respiratory distress. She has no decreased breath sounds. She has no wheezes.  No evidence of respiratory distress. Able to speak in full sentences without difficulty.  Abdominal: Soft. Normal  appearance. There is no tenderness. There is no rigidity and no guarding.  Musculoskeletal: Normal range of motion.  Neurological: She is alert and oriented to person, place, and time.  Skin: Skin is warm and dry. Capillary refill takes less than 2 seconds.  Psychiatric: She has a normal mood and affect. Her speech is normal and behavior is normal.  Nursing note and vitals reviewed.    ED Treatments / Results  Labs (all labs ordered are listed, but only abnormal results are displayed) Labs Reviewed - No data to display  EKG  EKG Interpretation None       Radiology No results found.  Procedures Procedures (including critical care time)  Medications Ordered in ED Medications  methylPREDNISolone sodium succinate (SOLU-MEDROL) 125 mg/2 mL injection 125 mg (125 mg Intravenous Given 07/17/17 1555)  famotidine (PEPCID) IVPB 20 mg premix (0 mg Intravenous Stopped 07/17/17 1646)  sodium chloride 0.9 % bolus 1,000 mL (0 mLs Intravenous Stopped 07/17/17 1730)     Initial Impression / Assessment and Plan / ED Course  I have reviewed the triage vital signs and the nursing notes.  Pertinent labs & imaging results that were available during my care of the patient were reviewed by me and considered in my medical decision making (see chart for details).     36 year old female with known shellfish allergy who presents with potential allergic reaction. Started experiencing hives, throat tightness, irritation. No chest pain, difficulty breathing. 50 mg Benadryl and EpiPen 2 prior to ED arrival. Physical exam shows some mild irritation of the posterior oropharynx. Otherwise airways pain, phonation is normal. No evidence of respiratory distress. No evidence of oral angioedema. Will plan to monitor in the department. We'll give a dose of steroids and Pepcid here in the department. IVF given for fluid resuscitation  4:28 PM: Reevaluation after meds and fluids. She reports feeling better though she  still feels some scratchiness in her throat. No evidence of throat tightening, swelling. No difficulty breathing. Reevaluation of lung shows no evidence of wheezing, stridor. We'll plan to continue IV fluids and monitor. Plan to PO challenge patient in the department.   5:19 PM: Reevaluation. Patient able tolerate by mouth in the department. She reports improvement in symptoms. Lungs clear to auscultation. Heart rate has improved. Patient with normal phonation airways intact. Patient is stable for discharge at this time. Will plan to treat symptomatically. Will plan to refill EpiPen. Patient instructed follow-up with her primary care doctor next 24-48 hours for further evaluation. Strict return precautions discussed. Patient expresses understanding and agreement to plan.  Final Clinical Impressions(s) / ED Diagnoses   Final diagnoses:  Allergic reaction, initial encounter    New Prescriptions Discharge Medication List as of 07/17/2017  5:25 PM    START taking these medications   Details  diphenhydrAMINE (BENADRYL) 25 mg capsule Take 1 capsule (25 mg total) by mouth every 6 (six) hours as needed., Starting Wed 07/17/2017, Until Mon 07/22/2017, Print    famotidine (PEPCID) 40 MG tablet Take 1 tablet (40 mg total) by mouth daily., Starting Wed 07/17/2017, Until Mon 07/22/2017, Print         Maxwell Caul, PA-C 07/18/17 1706    Rolland Porter, MD 07/30/17 2030

## 2017-07-17 NOTE — ED Notes (Signed)
Ginger ale given per pt request. 

## 2017-08-02 DIAGNOSIS — Z23 Encounter for immunization: Secondary | ICD-10-CM | POA: Diagnosis not present

## 2017-09-09 DIAGNOSIS — M47816 Spondylosis without myelopathy or radiculopathy, lumbar region: Secondary | ICD-10-CM | POA: Diagnosis not present

## 2017-09-18 DIAGNOSIS — F411 Generalized anxiety disorder: Secondary | ICD-10-CM | POA: Diagnosis not present

## 2017-09-30 DIAGNOSIS — N3010 Interstitial cystitis (chronic) without hematuria: Secondary | ICD-10-CM | POA: Diagnosis not present

## 2017-10-02 DIAGNOSIS — N3010 Interstitial cystitis (chronic) without hematuria: Secondary | ICD-10-CM | POA: Diagnosis not present

## 2017-10-09 DIAGNOSIS — R030 Elevated blood-pressure reading, without diagnosis of hypertension: Secondary | ICD-10-CM | POA: Diagnosis not present

## 2017-10-09 DIAGNOSIS — M47816 Spondylosis without myelopathy or radiculopathy, lumbar region: Secondary | ICD-10-CM | POA: Diagnosis not present

## 2017-10-09 DIAGNOSIS — Z6837 Body mass index (BMI) 37.0-37.9, adult: Secondary | ICD-10-CM | POA: Diagnosis not present

## 2017-10-09 DIAGNOSIS — Q763 Congenital scoliosis due to congenital bony malformation: Secondary | ICD-10-CM | POA: Diagnosis not present

## 2017-11-14 ENCOUNTER — Other Ambulatory Visit (HOSPITAL_COMMUNITY): Payer: Self-pay | Admitting: Surgery

## 2017-11-19 ENCOUNTER — Encounter: Payer: BLUE CROSS/BLUE SHIELD | Attending: Surgery | Admitting: Registered"

## 2017-11-19 ENCOUNTER — Encounter: Payer: Self-pay | Admitting: Registered"

## 2017-11-19 DIAGNOSIS — Z713 Dietary counseling and surveillance: Secondary | ICD-10-CM | POA: Insufficient documentation

## 2017-11-19 DIAGNOSIS — E669 Obesity, unspecified: Secondary | ICD-10-CM

## 2017-11-19 DIAGNOSIS — Z6838 Body mass index (BMI) 38.0-38.9, adult: Secondary | ICD-10-CM | POA: Insufficient documentation

## 2017-11-19 NOTE — Progress Notes (Signed)
Pre-Op Assessment Visit:  Pre-Operative Sleeve gastrectomy Surgery  Medical Nutrition Therapy:  Appt start time: 2:15  End time: 3:07  Patient was seen on 11/19/2017 for Pre-Operative Nutrition Assessment. Assessment [facility] on 11/19/2017.   Pt expectation of surgery: play with kids, able to go upstairs with out feeling tired, able to play on the floor, wants to play tennis again, wants to increase energy  Pt expectation of Dietitian: none stated  Start weight at [facility]: 255.5 BMI: 38.56   Pt states she has 749 month-old twins. Pt states she had RY duodenojejunostomy at age 37; also history of Cushing's disease and celiac disease. Pt states she gets iron infusions and B12 injections.    Pt states she is unsure about how many visits needed prior to surgery.    24 hr Dietary Recall: First Meal: gluten-free waffle with peanut butter Snack: none Second Meal: sauteed mushrooms Snack: 4 gluten-free cookies Third Meal: sometimes skips; lamb, steamed vegetables  Snack: none Beverages: wine, 2 diet cherry Pepsi's, 1 L water  Encouraged to engage in 150 minutes of moderate physical activity including cardiovascular and weight baring weekly  Handouts given during visit include:  . Pre-Op Goals . Bariatric Surgery Protein Shakes . Vitamin and Mineral Recommendations  During the appointment today the following Pre-Op Goals were reviewed with the patient: . Track your food and beverage: MyFitness Pal or Baritastic App . Make healthy food choices . Begin to limit portion sizes . Limited concentrated sugars and fried foods . Keep fat/sugar in the single digits per serving on food labels . Practice CHEWING your food  (aim for 30 chews per bite or until applesauce consistency) . Practice not drinking 15 minutes before, during, and 30 minutes after each meal/snack . Avoid all carbonated beverages   . Avoid/limit caffeinated beverages  . Avoid all sugar-sweetened beverages . Avoid alcohol . Consume 3 meals per day; eat every 3-5 hours . Make a list of non-food related activities . Aim for 64-100 ounces of FLUID daily  . Aim for at least 60-80 grams of PROTEIN daily . Look for a liquid protein source that contain ?15 g protein and ?5 g carbohydrate  (ex: shakes, drinks, shots) . Physical activity is an important part of a healthy lifestyle so keep it moving!  Follow diet recommendations listed below Energy and Macronutrient Recommendations: Calories: 1800 Carbohydrate: 200 Protein: 135 Fat: 50  Demonstrated degree of understanding via:  Teach Back   Teaching Method Utilized:  Visual Auditory Hands on  Barriers to learning/adherence to lifestyle change: none identified  Patient to call the [facility] to enroll in Pre-Op and Post-Op Nutrition Education when surgery date is scheduled.

## 2017-11-26 ENCOUNTER — Ambulatory Visit (HOSPITAL_COMMUNITY)
Admission: RE | Admit: 2017-11-26 | Discharge: 2017-11-26 | Disposition: A | Discharge status: Home / Self Care | Payer: BLUE CROSS/BLUE SHIELD | Source: Ambulatory Visit | Attending: Surgery | Admitting: Surgery

## 2017-11-26 ENCOUNTER — Other Ambulatory Visit: Payer: Self-pay

## 2017-11-26 DIAGNOSIS — Z01818 Encounter for other preprocedural examination: Secondary | ICD-10-CM | POA: Diagnosis not present

## 2017-11-28 ENCOUNTER — Ambulatory Visit: Payer: BLUE CROSS/BLUE SHIELD | Admitting: Skilled Nursing Facility1

## 2017-12-05 DIAGNOSIS — E249 Cushing's syndrome, unspecified: Secondary | ICD-10-CM | POA: Diagnosis not present

## 2017-12-05 DIAGNOSIS — R21 Rash and other nonspecific skin eruption: Secondary | ICD-10-CM | POA: Diagnosis not present

## 2017-12-05 DIAGNOSIS — E669 Obesity, unspecified: Secondary | ICD-10-CM | POA: Diagnosis not present

## 2017-12-07 DIAGNOSIS — H8110 Benign paroxysmal vertigo, unspecified ear: Secondary | ICD-10-CM | POA: Diagnosis not present

## 2017-12-09 ENCOUNTER — Ambulatory Visit (INDEPENDENT_AMBULATORY_CARE_PROVIDER_SITE_OTHER): Payer: BLUE CROSS/BLUE SHIELD | Admitting: Clinical

## 2017-12-09 DIAGNOSIS — E668 Other obesity: Secondary | ICD-10-CM | POA: Diagnosis not present

## 2017-12-09 DIAGNOSIS — F509 Eating disorder, unspecified: Secondary | ICD-10-CM | POA: Diagnosis not present

## 2017-12-11 ENCOUNTER — Other Ambulatory Visit: Payer: Self-pay

## 2017-12-11 ENCOUNTER — Ambulatory Visit: Payer: BLUE CROSS/BLUE SHIELD | Attending: Family Medicine

## 2017-12-11 DIAGNOSIS — H8111 Benign paroxysmal vertigo, right ear: Secondary | ICD-10-CM | POA: Insufficient documentation

## 2017-12-11 DIAGNOSIS — R2689 Other abnormalities of gait and mobility: Secondary | ICD-10-CM | POA: Insufficient documentation

## 2017-12-11 DIAGNOSIS — R42 Dizziness and giddiness: Secondary | ICD-10-CM | POA: Insufficient documentation

## 2017-12-11 NOTE — Therapy (Signed)
[facility] [address] [place], [place], [address] Phone: [phone]   Fax:  [phone]  Physical Therapy Evaluation  Patient Details  Name: Kaitlyn Johnson MRN: [id number] Date of Birth: 01/16/1981 Referring Provider: Johny Blamer MD (dorathy  Scifers  MD)   Encounter Date: 12/11/2017  PT End of Session - 12/11/17 1601    Visit Number  1    Number of Visits  8    Date for PT Re-Evaluation  01/08/18    PT Start Time  1016    PT Stop Time  1100    PT Time Calculation (min)  44 min    Activity Tolerance  Patient tolerated treatment well    Behavior During Therapy  [facility] for tasks assessed/performed       Past Medical History:  Diagnosis Date  . Anemia 11/26/2011  . Anxiety   . Asthma   . Celiac and mesenteric artery injury   . Cushing's syndrome (HCC)    06/21/16- "in remission"  . Depression   . H/O hiatal hernia   . History of kidney stones   . POTS (postural orthostatic tachycardia syndrome)   . Shortness of breath dyspnea    with exertertion  . Sphincter of Oddi dysfunction   . Von Willebrand disease (HCC)    bruising easy    Past Surgical History:  Procedure Laterality Date  . ABDOMINAL HYSTERECTOMY  08/2010  . ANTERIOR CERVICAL DECOMP/DISCECTOMY FUSION N/A 03/31/2013   Procedure: ANTERIOR CERVICAL DECOMPRESSION/DISCECTOMY FUSION 1 LEVEL Cervical five-six;  Surgeon: Reinaldo Meeker, MD;  Location: [facility];  Service: Neurosurgery;  Laterality: N/A;  . Cholangio-Pancreatography with Spintectorotomy + Stent  07/16/2012   01/15/14  . CHOLECYSTECTOMY    . LAPAROSCOPIC APPENDECTOMY N/A 02/28/2017   Procedure: APPENDECTOMY LAPAROSCOPIC;  Surgeon: Harriette Bouillon, MD;  Location: [facility];  Service: General;  Laterality: N/A;  . LAPAROSCOPIC ENDOMETRIOSIS FULGURATION    . LUMBAR LAMINECTOMY    . LUMBAR LAMINECTOMY/DECOMPRESSION MICRODISCECTOMY Left 06/22/2016   Procedure: MICRODISCECTOMY LEFT  LUMBAR FOUR-FIVE;  Surgeon: Lisbeth Renshaw, MD;  Location: [facility];  Service: Neurosurgery;  Laterality: Left;  . ROUX-EN-Y PROCEDURE  04/1999    There were no vitals filed for this visit.   Subjective Assessment - 12/11/17 1023    Subjective  Pt is a 37 y/o F who has been referred to OPPT by Johny Blamer MD for Vertigo referral date 12/10/2017.  pt reports dizziness last Thursday. pt reports having to check for H pylori and had to have a medication beverage that made her very ill  resulting in her vomiting and this was her reported on set of the dizziness. Pt reports not being able to turn her head without dizziness and that she must lay down once it starts to get it to  settle back down. She has two children they she would like to get back to playing with without having to deal with the intermittent dizziness that is intense to the point it keeps her form doing what she enjoys as well as her ADLs. Pt was told by MD that dizziness is likely chemical vertigo.    Pertinent History  Postural orthostatic tachycardia syndrome, cushing' disease, and celiac disease Anterior cervical decompression disectomy/fusion 03/31/2013. Lumbar laminectomy/decompression microdisectomy 06/22/2016    Limitations  Standing;Walking;House hold activities    Patient Stated Goals  pt wants to have no dizziness and be able to move without dizziness    Currently in Pain?  Yes  Pain Score  6     Pain Location  Head    Pain Orientation  Left;Right    Pain Descriptors / Indicators  Headache    Pain Onset  In the past 7 days    Pain Frequency  Intermittent    Aggravating Factors   moving around.     Pain Relieving Factors  laying down     Effect of Pain on Daily Activities  ADLs        [facility] PT Assessment - 12/11/17 1029      Assessment   Medical Diagnosis  Vertigo    Referring Provider  Johny Blamer MD dorathy  Scifers  MD    Hand Dominance  Left    Prior Therapy  PT in the past knee injury        Precautions   Precautions  Fall    Precaution Comments  as pt reports unsteadiness when performing head turns and with intermittent use of wall to maintain balance.      Restrictions   Weight Bearing Restrictions  No      Balance Screen   Has the patient fallen in the past 6 months  No    Has the patient had a decrease in activity level because of a fear of falling?   No    Is the patient reluctant to leave their home because of a fear of falling?   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 multiple times day     Duration of Dizziness  will last untill she lays.  once laying down its takes about 5 min     Aggravating Factors  Lying supine;Looking up to the ceiling;Moving eyes;Sitting with head tilted back;Turning body quickly;Turning head sideways;Driving;Supine to sit;Rolling to right;Rolling to left;Forward bending    Relieving Factors  Rest      Occulomotor Exam   Occulomotor Alignment  Normal    Spontaneous  Absent    Gaze-induced  Absent    Head shaking Horizontal  --    Smooth Pursuits  Intact Slow  with dizziness with R/L and Up     Saccades  Intact reproduce dizziness BIL    Comment  No saccades noted during B HIT testing but pt guarded and reported dizziness.      Vestibulo-Occular Reflex   VOR 1  Head Only (x 1 viewing)  slow and guarded movements R reproduction of R ear ringing. ( last for <5sec)    VOR Cancellation  Normal Slow and guarded movements  Positional Testing   Dix-Hallpike  Dix-Hallpike Left;Dix-Hallpike Right    Horizontal Canal Testing  Horizontal Canal Left;Horizontal Canal Right      Dix-Hallpike Right   Dix-Hallpike Right Duration  2-3 beats of nystagmus    Dix-Hallpike Right Symptoms  Upbeat, right rotatory nystagmus Dizziness 8/10      Dix-Hallpike Left   Dix-Hallpike Left Duration  No nystagmus noted with dizziness remaining the same as baseline.    Dix-Hallpike Left Symptoms  No nystagmus      Horizontal Canal Right   Horizontal Canal Right Duration  none, dizziness remained the same as baseline.    Horizontal Canal Right Symptoms  Normal;Other (comment)      Horizontal Canal Left   Horizontal Canal Left Duration  none, dizziness remained the same as baseline.    Horizontal Canal Left Symptoms  Normal         Objective measurements completed on examination: See above findings.        Vestibular Treatment/Exercise - 12/11/17 1704      Vestibular Treatment/Exercise   Vestibular Treatment Provided  Canalith Repositioning    Canalith Repositioning  Epley Manuever Right       EPLEY MANUEVER RIGHT   Number of Reps   1    Overall Response  Improved Symptoms    Response Details   Pt reported improved dizziness and no nystagmus noted after treatment.         PT Education - 12/11/17 1559    Education provided  Yes    Education Details  PT POC, s/s of vestibular hypofunciton, inner ear anatomy, Epley maneuver, symptom reduction    Person(s) Educated  Patient    Methods  Explanation;Verbal cues;Demonstration    Comprehension  Verbalized understanding       PT Short Term Goals - 12/11/17 1620      PT SHORT TERM GOAL #1   Title  Pt will report independence with her initial HEP     Time  4    Period  Weeks    Status  New    Target  Date  01/08/18      PT SHORT TERM GOAL #2   Title  Pt will demonstrate methods used to reduce her s/s at home    Time  4    Period  Weeks    Status  New    Target Date  01/08/18      PT SHORT TERM GOAL #3   Title  Pt will report a reduction of headache s/s intensity to </2/10     Time  4    Period  Weeks    Status  New    Target Date  01/08/18      PT SHORT TERM GOAL #4   Title  Pt will report a reduction In dizziness intensity form to </=3/10 in order to improve QOL     Time  4    Period  Weeks    Status  New    Target Date  01/08/18      PT SHORT TERM GOAL #5   Title  Pt will demonstrate ambulation 200' maintaining light conversation with horizontal scanning without dizziness s/s and without reducing gait speed    Time  4    Period  Weeks    Status  New    Target Date  01/08/18      Additional Short Term Goals   Additional Short Term Goals  Yes      PT  SHORT TERM GOAL #6   Title  Pt will demonstrate supine to stand transfers without dizziness to improve her ability to play with her children.     Time  4    Period  Weeks    Status  New    Target Date  01/08/18              Plan - 12/11/17 1614    Clinical Impression Statement  Pt is a 37 y/o f who presents to OPPT with complaints of vertigo ( dizziness and ringing in ears). Pt's PMH is significant for the following: Postural orthostatic tachycardia syndrome, cushing' disease, and celiac disease Anterior cervical decompression disectomy/fusion 03/31/2013. Lumbar laminectomy/decompression microdisectomy 06/22/2016 and has a history of vertigo after receiving new medications. Upon PT evaluation pt appears significantly guarded with all cervical AROM( walking/turning enblock)  and reports dizziness with bilateral head and eye movements. Saccades, smooth pursuit, Vestibular ocular reflex, and vestibular ocular reflex cancellation assessments are all unremarkable other than pt moving slowly through each assessment and  requiring multiple breaks due to incr. in dizziness. Pt symptoms seem to resolve within 10-45 sec and she was able to complete the Vestibular evaluation. Pt did demonstrate 3 beats of Right upbeating torsional nystagmus with R Dix-hallpike assessment and reported a reduction in dizziness symptoms following bilateral dix-hallpike assessments and an epley maneuver to the left. Overall pt reported a reduction in dizziness from an 8/10 to a 4/10 after Evaluation. 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 Pt will continue to benefit from skilled PT intervention to address issues noted above and return her to her to caring for her children and performing ADLs in a safe manner.     History and Personal Factors relevant to plan of care:  History of vertigo, Postural orthostatic tachycardia syndrome, cushing' disease, and celiac disease Anterior cervical decompression disectomy/fusion 03/31/2013. Lumbar laminectomy/decompression microdisectomy 06/22/2016    Clinical Presentation  Stable    Clinical Presentation due to:  Changing intensity of Vertigo signs and symptoms,     Clinical Decision Making  Moderate    Rehab Potential  Good    PT Frequency  2x / week    PT Duration  4 weeks    PT Treatment/Interventions  Gait training;Stair training;Therapeutic activities;Therapeutic exercise;Balance training;Neuromuscular re-education;Vestibular;Energy conservation;Patient/family education;ADLs/Self Care Home Management    PT Next Visit Plan  establish initial HEP program( gaze stability and habituation exercise). Re-assess for R pBPPV and treat as indicated.     Consulted and Agree with Plan of Care  Patient       Patient will benefit from skilled therapeutic intervention in order to improve the following deficits and impairments:  Decreased balance, Dizziness, Difficulty walking  Visit Diagnosis: Dizziness and giddiness     Problem List Patient Active Problem List   Diagnosis Date  Noted  . Acute appendicitis 02/28/2017  . HNP (herniated nucleus pulposus), lumbar 06/22/2016  . Nausea with vomiting 11/30/2013  . Abdominal pain, epigastric 11/30/2013  . Dehydration 11/30/2013  . Acute pancreatitis 11/30/2013  . Pancreatitis 11/29/2013  . Gastroenteritis due to norovirus 11/26/2013  . Hypomagnesemia 11/25/2013  . Nausea vomiting and diarrhea 11/23/2013  . Anemia of chronic disease 11/23/2013  . Hypokalemia 11/23/2013  . Hyponatremia 11/23/2013  . Nausea and vomiting 11/23/2013  . Iatrogenic Cushing's syndrome (HCC) 10/02/2013  . Endometriosis 09/01/2013  . Celiac disease 09/01/2013  . Asthma, chronic 09/01/2013  . 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 POTS (postural orthostatic tachycardia  syndrome) 11/26/2011    Merton Border SPT 12/11/2017, 4:23 PM  [facility] [address] [place], [place], [address] Phone: [phone]   Fax:  [phone]  Name: Kaitlyn Johnson MRN: [id number] Date of Birth: 1981/05/02  Zerita Boers, PT,DPT 12/11/17 5:25 PM Phone: [phone] Fax: [phone]

## 2017-12-16 ENCOUNTER — Ambulatory Visit (INDEPENDENT_AMBULATORY_CARE_PROVIDER_SITE_OTHER): Payer: BLUE CROSS/BLUE SHIELD | Admitting: Clinical

## 2017-12-16 DIAGNOSIS — F509 Eating disorder, unspecified: Secondary | ICD-10-CM

## 2017-12-16 DIAGNOSIS — E669 Obesity, unspecified: Secondary | ICD-10-CM

## 2017-12-17 DIAGNOSIS — B9620 Unspecified Escherichia coli [E. coli] as the cause of diseases classified elsewhere: Secondary | ICD-10-CM | POA: Diagnosis not present

## 2017-12-17 DIAGNOSIS — N3010 Interstitial cystitis (chronic) without hematuria: Secondary | ICD-10-CM | POA: Diagnosis not present

## 2017-12-17 DIAGNOSIS — N390 Urinary tract infection, site not specified: Secondary | ICD-10-CM | POA: Diagnosis not present

## 2018-01-20 ENCOUNTER — Encounter: Payer: BLUE CROSS/BLUE SHIELD | Attending: Surgery | Admitting: Registered"

## 2018-01-20 DIAGNOSIS — Z713 Dietary counseling and surveillance: Secondary | ICD-10-CM | POA: Diagnosis not present

## 2018-01-20 DIAGNOSIS — Z6838 Body mass index (BMI) 38.0-38.9, adult: Secondary | ICD-10-CM | POA: Diagnosis not present

## 2018-01-20 DIAGNOSIS — E669 Obesity, unspecified: Secondary | ICD-10-CM

## 2018-01-20 NOTE — Progress Notes (Signed)
  Pre-Operative Nutrition Class:  Appt start time: 8:15   End time:  9:15  Patient was seen on 01/20/2018 for Pre-Operative Bariatric Surgery [facility].   Surgery date: 02/04/2018 Surgery type: Sleeve  [facility] at [facility]: 255.5 Weight today: 256.2   Samples given per MNT protocol. Patient educated on appropriate usage: Bariatric Advantage Multivitamin Lot # [id number] Exp: 04/2018  Bariatric Advantage Calcium Citrate Lot # [id number] Exp: 08/16/2018  Bariatric Advantage Calcium Citrate Lot # [id number] Exp: 07/06/2018  Renee Pain Protein Shake Lot # [id number] Exp: 08/16/2018  The following the learning objectives were met by the patient during this course:  Identify Pre-Op Dietary Goals and will begin 2 weeks pre-operatively  Identify appropriate sources of fluids and proteins   State protein recommendations and appropriate sources pre and post-operatively  Identify Post-Operative Dietary Goals and will follow for 2 weeks post-operatively  Identify appropriate multivitamin and calcium sources  Describe the need for physical activity post-operatively and will follow MD recommendations  State when to call healthcare provider regarding medication questions or post-operative complications  Handouts given during class include:  Pre-Op Bariatric Surgery Diet Handout  Protein Shake Handout  Post-Op Bariatric Surgery Nutrition Handout  [facility] Program Information Flyer  Support Group Information Flyer  [facility] Bariatric Supplements Price List  Follow-Up Plan: Patient will follow-up at [facility] 2 weeks post operatively for diet advancement per MD.

## 2018-01-23 DIAGNOSIS — I951 Orthostatic hypotension: Secondary | ICD-10-CM | POA: Diagnosis not present

## 2018-01-23 DIAGNOSIS — R000 Tachycardia, unspecified: Secondary | ICD-10-CM | POA: Diagnosis not present

## 2018-01-29 DIAGNOSIS — F331 Major depressive disorder, recurrent, moderate: Secondary | ICD-10-CM | POA: Diagnosis not present

## 2018-01-29 DIAGNOSIS — F411 Generalized anxiety disorder: Secondary | ICD-10-CM | POA: Diagnosis not present

## 2018-03-27 DIAGNOSIS — M47816 Spondylosis without myelopathy or radiculopathy, lumbar region: Secondary | ICD-10-CM | POA: Diagnosis not present

## 2018-03-27 DIAGNOSIS — Q763 Congenital scoliosis due to congenital bony malformation: Secondary | ICD-10-CM | POA: Diagnosis not present

## 2018-04-07 DIAGNOSIS — M545 Low back pain: Secondary | ICD-10-CM | POA: Diagnosis not present

## 2018-04-07 DIAGNOSIS — M6281 Muscle weakness (generalized): Secondary | ICD-10-CM | POA: Diagnosis not present

## 2018-04-07 DIAGNOSIS — R293 Abnormal posture: Secondary | ICD-10-CM | POA: Diagnosis not present

## 2018-04-07 DIAGNOSIS — M2560 Stiffness of unspecified joint, not elsewhere classified: Secondary | ICD-10-CM | POA: Diagnosis not present

## 2018-04-10 DIAGNOSIS — M545 Low back pain: Secondary | ICD-10-CM | POA: Diagnosis not present

## 2018-04-10 DIAGNOSIS — M6281 Muscle weakness (generalized): Secondary | ICD-10-CM | POA: Diagnosis not present

## 2018-04-10 DIAGNOSIS — R293 Abnormal posture: Secondary | ICD-10-CM | POA: Diagnosis not present

## 2018-04-10 DIAGNOSIS — M2560 Stiffness of unspecified joint, not elsewhere classified: Secondary | ICD-10-CM | POA: Diagnosis not present

## 2018-04-14 DIAGNOSIS — M2560 Stiffness of unspecified joint, not elsewhere classified: Secondary | ICD-10-CM | POA: Diagnosis not present

## 2018-04-14 DIAGNOSIS — R293 Abnormal posture: Secondary | ICD-10-CM | POA: Diagnosis not present

## 2018-04-14 DIAGNOSIS — M6281 Muscle weakness (generalized): Secondary | ICD-10-CM | POA: Diagnosis not present

## 2018-04-14 DIAGNOSIS — M545 Low back pain: Secondary | ICD-10-CM | POA: Diagnosis not present

## 2018-04-15 DIAGNOSIS — M2560 Stiffness of unspecified joint, not elsewhere classified: Secondary | ICD-10-CM | POA: Diagnosis not present

## 2018-04-15 DIAGNOSIS — M6281 Muscle weakness (generalized): Secondary | ICD-10-CM | POA: Diagnosis not present

## 2018-04-15 DIAGNOSIS — R293 Abnormal posture: Secondary | ICD-10-CM | POA: Diagnosis not present

## 2018-04-15 DIAGNOSIS — M545 Low back pain: Secondary | ICD-10-CM | POA: Diagnosis not present

## 2018-04-21 DIAGNOSIS — J069 Acute upper respiratory infection, unspecified: Secondary | ICD-10-CM | POA: Diagnosis not present

## 2018-04-23 DIAGNOSIS — J019 Acute sinusitis, unspecified: Secondary | ICD-10-CM | POA: Diagnosis not present

## 2018-04-29 DIAGNOSIS — M2560 Stiffness of unspecified joint, not elsewhere classified: Secondary | ICD-10-CM | POA: Diagnosis not present

## 2018-04-29 DIAGNOSIS — M545 Low back pain: Secondary | ICD-10-CM | POA: Diagnosis not present

## 2018-04-29 DIAGNOSIS — R293 Abnormal posture: Secondary | ICD-10-CM | POA: Diagnosis not present

## 2018-04-29 DIAGNOSIS — M6281 Muscle weakness (generalized): Secondary | ICD-10-CM | POA: Diagnosis not present

## 2018-05-05 DIAGNOSIS — Z6838 Body mass index (BMI) 38.0-38.9, adult: Secondary | ICD-10-CM | POA: Diagnosis not present

## 2018-05-05 DIAGNOSIS — Q763 Congenital scoliosis due to congenital bony malformation: Secondary | ICD-10-CM | POA: Diagnosis not present

## 2018-05-05 DIAGNOSIS — R030 Elevated blood-pressure reading, without diagnosis of hypertension: Secondary | ICD-10-CM | POA: Diagnosis not present

## 2018-05-24 ENCOUNTER — Emergency Department (HOSPITAL_BASED_OUTPATIENT_CLINIC_OR_DEPARTMENT_OTHER)
Admission: EM | Admit: 2018-05-24 | Discharge: 2018-05-24 | Discharge status: Against Medical Advice | Payer: BLUE CROSS/BLUE SHIELD | Attending: Emergency Medicine | Admitting: Emergency Medicine

## 2018-05-24 ENCOUNTER — Encounter (HOSPITAL_BASED_OUTPATIENT_CLINIC_OR_DEPARTMENT_OTHER): Payer: Self-pay | Admitting: Emergency Medicine

## 2018-05-24 ENCOUNTER — Other Ambulatory Visit: Payer: Self-pay

## 2018-05-24 ENCOUNTER — Emergency Department (HOSPITAL_BASED_OUTPATIENT_CLINIC_OR_DEPARTMENT_OTHER): Payer: BLUE CROSS/BLUE SHIELD

## 2018-05-24 DIAGNOSIS — R072 Precordial pain: Secondary | ICD-10-CM | POA: Diagnosis not present

## 2018-05-24 DIAGNOSIS — R079 Chest pain, unspecified: Secondary | ICD-10-CM | POA: Diagnosis not present

## 2018-05-24 DIAGNOSIS — R002 Palpitations: Secondary | ICD-10-CM

## 2018-05-24 DIAGNOSIS — Z79899 Other long term (current) drug therapy: Secondary | ICD-10-CM | POA: Diagnosis not present

## 2018-05-24 DIAGNOSIS — R110 Nausea: Secondary | ICD-10-CM | POA: Diagnosis not present

## 2018-05-24 DIAGNOSIS — D649 Anemia, unspecified: Secondary | ICD-10-CM | POA: Insufficient documentation

## 2018-05-24 DIAGNOSIS — J45909 Unspecified asthma, uncomplicated: Secondary | ICD-10-CM | POA: Insufficient documentation

## 2018-05-24 DIAGNOSIS — R0789 Other chest pain: Secondary | ICD-10-CM | POA: Diagnosis not present

## 2018-05-24 DIAGNOSIS — F419 Anxiety disorder, unspecified: Secondary | ICD-10-CM | POA: Insufficient documentation

## 2018-05-24 DIAGNOSIS — D680 Von Willebrand's disease: Secondary | ICD-10-CM | POA: Insufficient documentation

## 2018-05-24 DIAGNOSIS — E248 Other Cushing's syndrome: Secondary | ICD-10-CM | POA: Insufficient documentation

## 2018-05-24 LAB — BASIC METABOLIC PANEL
Anion gap: 12 (ref 5–15)
BUN: 12 mg/dL (ref 6–20)
CO2: 21 mmol/L — ABNORMAL LOW (ref 22–32)
Calcium: 9.1 mg/dL (ref 8.9–10.3)
Chloride: 104 mmol/L (ref 98–111)
Creatinine, Ser: 0.73 mg/dL (ref 0.44–1.00)
GFR calc Af Amer: >60 mL/min (ref >60)
GFR calc non Af Amer: >60 mL/min (ref >60)
Glucose, Bld: 96 mg/dL (ref 70–99)
Potassium: 4 mmol/L (ref 3.5–5.1)
Sodium: 137 mmol/L (ref 135–145)

## 2018-05-24 LAB — CBC
HCT: 38.7 % (ref 36.0–46.0)
Hemoglobin: 13.1 g/dL (ref 12.0–15.0)
MCH: 31.9 pg (ref 26.0–34.0)
MCHC: 33.9 g/dL (ref 30.0–36.0)
MCV: 94.2 fL (ref 78.0–100.0)
Platelets: 283 10*3/uL (ref 150–400)
RBC: 4.11 MIL/uL (ref 3.87–5.11)
RDW: 12.7 % (ref 11.5–15.5)
WBC: 9.2 10*3/uL (ref 4.0–10.5)

## 2018-05-24 LAB — TROPONIN I: Troponin I: <0.03 ng/mL (ref <0.03)

## 2018-05-24 NOTE — ED Notes (Signed)
Patient transported to X-ray 

## 2018-05-24 NOTE — ED Triage Notes (Signed)
Chest pain x 45 min, under L breast, non radiating. Described as pressure. Hx of POTS.

## 2018-05-24 NOTE — ED Notes (Signed)
Per EDP, pt is leaving AMA.

## 2018-05-24 NOTE — Discharge Instructions (Addendum)
You are choosing to leave AGAINST MEDICAL ADVICE, which means that you understand that your work up was not fully completed today and therefore leaving could lead to worsening condition or even death. Stay well hydrated, follow up closely with your cardiologist in the next 1-2 days for recheck of symptoms and ongoing evaluation of your symptoms. Return to the ER for emergent changes or worsening symptoms.

## 2018-05-24 NOTE — ED Provider Notes (Signed)
[facility] EMERGENCY DEPARTMENT Provider Note   CSN: [id number] Arrival date & time: 05/24/18  1509     History   Chief Complaint Chief Complaint  Patient presents with  . Chest Pain    HPI Kaitlyn Johnson is a 37 y.o. female with a PMHx of POTS, asthma, anxiety, anemia, iatrogenic Cushing's syndrome, von willebrand disease, kidney stones, hiatal hernia, superior mesenteric artery syndrome, and other conditions listed below, who presents to the ED with complaints of left-sided chest pain that began while she was laying on the couch at rest around 2:30 PM, approximately 3 hours prior to evaluation.  Patient describes the pain as 5/10 constant sharp left-sided chest pain that lasted for about 45 minutes and then became a dull constant pain in the left side of her chest, nonradiating, with no known aggravating factors, unchanged with exertion or inspiration, and with no treatments tried prior to arrival.  She reports having palpitations and feeling that her heart rate was fast although not irregular, and when she checked it it was greater than 120 for a period of time.  She also had some "mild" nausea.  Currently she feels better.  Her cardiologist is Dr. Lynnea Ferrier in [place].  Her maternal grandfather died of a massive heart attack at age 99, no other known family history of cardiac disease.  She is a non-smoker.  She denies diaphoresis, lightheadedness, fevers, chills, cough, SOB, LE swelling, recent travel/surgery/immobilization, estrogen use, personal/family hx of DVT/PE, abd pain, V/D/C, hematuria, dysuria, myalgias, arthralgias, claudication, orthopnea, numbness, tingling, focal weakness, or any other complaints at this time.   The history is provided by the patient and medical records. No language interpreter was used.  Chest Pain   Associated symptoms include nausea ("mild") and palpitations. Pertinent negatives include no abdominal pain, no cough, no diaphoresis, no fever,  no numbness, no shortness of breath, no vomiting and no weakness.    Past Medical History:  Diagnosis Date  . Anemia 11/26/2011  . Anxiety   . Asthma   . Celiac and mesenteric artery injury   . Cushing's syndrome (HCC)    06/21/16- "in remission"  . Depression   . H/O hiatal hernia   . History of kidney stones   . POTS (postural orthostatic tachycardia syndrome)   . Shortness of breath dyspnea    with exertertion  . Sphincter of Oddi dysfunction   . Von Willebrand disease (HCC)    bruising easy    Patient Active Problem List   Diagnosis Date Noted  . Acute appendicitis 02/28/2017  . HNP (herniated nucleus pulposus), lumbar 06/22/2016  . Nausea with vomiting 11/30/2013  . Abdominal pain, epigastric 11/30/2013  . Dehydration 11/30/2013  . Acute pancreatitis 11/30/2013  . Pancreatitis 11/29/2013  . Gastroenteritis due to norovirus 11/26/2013  . Hypomagnesemia 11/25/2013  . Nausea vomiting and diarrhea 11/23/2013  . Anemia of chronic disease 11/23/2013  . Hypokalemia 11/23/2013  . Hyponatremia 11/23/2013  . Nausea and vomiting 11/23/2013  . Iatrogenic Cushing's syndrome (HCC) 10/02/2013  . Endometriosis 09/01/2013  . Celiac disease 09/01/2013  . Asthma, chronic 09/01/2013  . 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 POTS (postural orthostatic tachycardia syndrome) 11/26/2011    Past Surgical History:  Procedure Laterality Date  . ABDOMINAL HYSTERECTOMY  08/2010  . ANTERIOR CERVICAL DECOMP/DISCECTOMY FUSION N/A 03/31/2013   Procedure: ANTERIOR CERVICAL DECOMPRESSION/DISCECTOMY FUSION 1 LEVEL Cervical five-six;  Surgeon: Reinaldo Meeker, MD;  Location: [facility];  Service: Neurosurgery;  Laterality: N/A;  .  Cholangio-Pancreatography with Spintectorotomy + Stent  07/16/2012   01/15/14  . CHOLECYSTECTOMY    . LAPAROSCOPIC APPENDECTOMY N/A 02/28/2017   Procedure: APPENDECTOMY LAPAROSCOPIC;  Surgeon: Harriette Bouillon, MD;  Location: [facility] OR[facility];  Service: General;   Laterality: N/A;  . LAPAROSCOPIC ENDOMETRIOSIS FULGURATION    . LUMBAR LAMINECTOMY    . LUMBAR LAMINECTOMY/DECOMPRESSION MICRODISCECTOMY Left 06/22/2016   Procedure: MICRODISCECTOMY LEFT LUMBAR FOUR-FIVE;  Surgeon: Lisbeth Renshaw, MD;  Location: [facility];  Service: Neurosurgery;  Laterality: Left;  . ROUX-EN-Y PROCEDURE  04/1999     OB History   None      Home Medications    Prior to Admission medications   Medication Sig Start Date End Date Taking? Authorizing Provider  albuterol (PROAIR HFA) 108 (90 Base) MCG/ACT inhaler Inhale 1-2 puffs into the lungs every 6 (six) hours as needed for wheezing or shortness of breath.     [provider]  AMBIEN CR 12.5 MG CR tablet Take 12.5 mg by mouth at bedtime. 10/31/15   [provider]  celecoxib (CELEBREX) 200 MG capsule Take 200 mg by mouth 2 (two) times daily.    [provider]  clonazePAM (KLONOPIN) 1 MG tablet Take 1 tablet (1 mg total) by mouth 2 (two) times daily as needed for anxiety. Patient not taking: Reported on 07/09/2017 12/07/13   Dorothea Ogle, MD  cyanocobalamin (,VITAMIN B-12,) 1000 MCG/ML injection Inject 1,000 mcg into the skin every 30 (thirty) days.     [provider]  diltiazem (CARDIZEM CD) 180 MG 24 hr capsule Take 180 mg by mouth daily.     [provider]  diphenhydrAMINE (BENADRYL) 25 mg capsule Take 1 capsule (25 mg total) by mouth every 6 (six) hours as needed. 07/17/17 07/22/17  Maxwell Caul, PA-C  DULoxetine (CYMBALTA) 30 MG capsule Take 30 mg by mouth daily.    [provider]  famotidine (PEPCID) 40 MG tablet Take 1 tablet (40 mg total) by mouth daily. 07/17/17 07/22/17  Graciella Freer A, PA-C  hydrOXYzine (ATARAX/VISTARIL) 10 MG tablet Take 10 mg by mouth daily.     [provider]  lamoTRIgine (LAMICTAL) 100 MG tablet Take 100 mg by mouth at bedtime.     [provider]  LATUDA 60 MG TABS Take 60 mg by mouth at bedtime.      [provider]  levalbuterol (XOPENEX) 0.31 MG/3ML nebulizer solution Take 1 ampule by nebulization every 4 (four) hours as needed for wheezing.    [provider]  metoprolol succinate (TOPROL-XL) 50 MG 24 hr tablet Take 50 mg by mouth 2 (two) times daily.     [provider]  montelukast (SINGULAIR) 10 MG tablet Take 10 mg by mouth at bedtime.    [provider]  omeprazole (PRILOSEC) 20 MG capsule Take 1 capsule (20 mg total) by mouth daily. Patient not taking: Reported on 07/09/2017 12/31/16   Demetrios Loll T, PA-C  ondansetron (ZOFRAN-ODT) 4 MG disintegrating tablet Take 1 tablet (4 mg total) by mouth every 6 (six) hours as needed for nausea. Patient not taking: Reported on 07/09/2017 03/01/17   Franne Forts, PA-C  oxyCODONE-acetaminophen (PERCOCET/ROXICET) 5-325 MG tablet Take 1 tablet by mouth every 6 (six) hours as needed for moderate pain. Patient not taking: Reported on 07/09/2017 03/01/17   Carlena Bjornstad A, PA-C  traZODone (DESYREL) 100 MG tablet Take 100 mg by mouth at bedtime.    [provider]    Family History Family History  Problem Relation Age of Onset  . Hypertension Mother     Social History Social History   Tobacco Use  . Smoking status: Never Smoker  . Smokeless tobacco: Never Used  Substance Use Topics  . Alcohol use: Yes    Alcohol/week: 3.0 oz    Types: 5 Glasses of wine per week  . Drug use: No     Allergies   Bee venom; Reglan [metoclopramide]; Shellfish allergy; Wheat bran; Adhesive [tape]; Gluten meal; Sulfa antibiotics; Tartrazine; and Yellow dye   Review of Systems Review of Systems  Constitutional: Negative for chills, diaphoresis and fever.  Respiratory: Negative for cough and shortness of breath.   Cardiovascular: Positive for chest pain and palpitations. Negative for leg swelling.  Gastrointestinal: Positive for nausea ("mild"). Negative for abdominal pain, constipation, diarrhea and vomiting.    Genitourinary: Negative for dysuria and hematuria.  Musculoskeletal: Negative for arthralgias and myalgias.  Skin: Negative for color change.  Allergic/Immunologic: Negative for immunocompromised state.  Neurological: Negative for weakness, light-headedness and numbness.  Psychiatric/Behavioral: Negative for confusion.   All other systems reviewed and are negative for acute change except as noted in the HPI.    Physical Exam Updated Vital Signs BP 112/73   Pulse 93   Temp 98.3 F (36.8 C) (Oral)   Resp (!) 22   Ht 5\' 8"  (1.727 m)   Wt 118.4 kg (261 lb)   SpO2 98%   BMI 39.68 kg/m   Physical Exam  Constitutional: She is oriented to person, place, and time. Vital signs are normal. She appears well-developed and well-nourished.  Non-toxic appearance. No distress.  Afebrile, nontoxic, NAD  HENT:  Head: Normocephalic and atraumatic.  Mouth/Throat: Oropharynx is clear and moist and mucous membranes are normal.  Eyes: Conjunctivae and EOM are normal. Right eye exhibits no discharge. Left eye exhibits no discharge.  Neck: Normal range of motion. Neck supple.  Cardiovascular: Normal rate, regular rhythm, normal heart sounds and intact distal pulses. Exam reveals no gallop and no friction rub.  No murmur heard. RRR, nl s1/s2, no m/r/g, distal pulses intact, no pedal edema   Pulmonary/Chest: Effort normal and breath sounds normal. No respiratory distress. She has no decreased breath sounds. She has no wheezes. She has no rhonchi. She has no rales. She exhibits no tenderness, no crepitus, no deformity and no retraction.  CTAB in all lung fields, no w/r/r, no hypoxia or increased WOB, speaking in full sentences, SpO2 98% on RA Chest wall nonTTP without crepitus, deformities, or retractions   Abdominal: Soft. Normal appearance and bowel sounds are normal. She exhibits no distension. There is no tenderness. There is no rigidity, no rebound, no guarding, no CVA tenderness, no tenderness at  McBurney's point and negative Opiela's sign.  Musculoskeletal: Normal range of motion.  MAE x4 Strength and sensation grossly intact in all extremities Distal pulses intact No pedal edema, neg homan's bilaterally   Neurological: She is alert and oriented to person, place, and time. She has normal strength. No sensory deficit.  Skin: Skin is warm, dry and intact. No rash noted.  Psychiatric: She has a normal mood and affect.  Nursing note and vitals reviewed.    ED Treatments / Results  Labs (all labs ordered are listed, but only abnormal results are displayed) Labs Reviewed  BASIC METABOLIC PANEL - Abnormal; Notable for the following components:      Result Value   CO2 21 (*)    All other components within normal limits  CBC  TROPONIN I    EKG EKG Interpretation  Date/Time:  Saturday May 24 2018 15:17:39 EDT Ventricular Rate:  101 PR Interval:  142 QRS Duration: 80 QT Interval:  346 QTC Calculation: 448 R Axis:   56 Text Interpretation:  Sinus tachycardia Nonspecific T wave abnormality Abnormal ECG Confirmed by Raeford Razor [phone]) on 05/24/2018 4:01:34 PM   Radiology Dg Chest 2 View  Result Date: 05/24/2018 CLINICAL DATA:  Left-sided chest pain EXAM: CHEST - 2 VIEW COMPARISON:  11/26/2017 FINDINGS: The heart size and mediastinal contours are within normal limits. Both lungs are clear. The visualized skeletal structures are unremarkable. IMPRESSION: No active cardiopulmonary disease. Electronically Signed   By: Alcide Clever M.D.   On: 05/24/2018 16:09    Procedures Procedures (including critical care time)  Medications Ordered in ED Medications - No data to display   Initial Impression / Assessment and Plan / ED Course  I have reviewed the triage vital signs and the nursing notes.  Pertinent labs & imaging results that were available during my care of the patient were reviewed by me and considered in my medical decision making (see chart for details).      37 y.o. female here with c/o L sided chest pain sudden onset while at rest about 3hrs prior to evaluation. Had palpitations with it and her HR went up >120s for a little while. On exam, no tachycardia, no hypoxia, clear lungs, no chest wall TTP, no pedal edema and neg homan's bilaterally. Work up thus far reveals: CBC WNL, BMP WNL, trop neg at 1hr mark, CXR neg. EKG with flat T-waves but fairly similar to prior EKGs, no acute ischemic findings. Discussed with pt that proceeding with D-dimer to r/o PE (since she's low risk, negative D-dimer would be enough to r/o), and repeating Trop at 3hr mark would be recommended however she states she feels fine now and wants to leave, doesn't want to stay for further evaluation. Long discussion had with pt regarding risks, and she still wishes to leave without further work up today. Advised close f/up with cardiology in the next 1-2 days, and strict return precautions were discussed. I explained the risks of leaving and the uncertainty of her exact diagnosis currently, and have given explicit precautions to return to the ER including for any other new or worsening symptoms. The patient understands and accepts the medical plan as it's been dictated but wishes to leave AMA; I have answered their questions. Discharge instructions concerning home care have been given. The patient is STABLE but is discharged to home AGAINST MEDICAL ADVICE. I informed my attending Dr. Juleen China of the pt's wish to leave AMA.    Final Clinical Impressions(s) / ED Diagnoses   Final diagnoses:  Precordial chest pain  Palpitations    ED Discharge Orders    [address], [place], [place] 05/24/18 1734    Raeford Razor, MD 05/25/18 [phone]

## 2018-05-27 DIAGNOSIS — R000 Tachycardia, unspecified: Secondary | ICD-10-CM | POA: Diagnosis not present

## 2018-05-27 DIAGNOSIS — I951 Orthostatic hypotension: Secondary | ICD-10-CM | POA: Diagnosis not present

## 2018-05-30 DIAGNOSIS — R000 Tachycardia, unspecified: Secondary | ICD-10-CM | POA: Diagnosis not present

## 2018-05-30 DIAGNOSIS — I951 Orthostatic hypotension: Secondary | ICD-10-CM | POA: Diagnosis not present

## 2018-06-20 DIAGNOSIS — I951 Orthostatic hypotension: Secondary | ICD-10-CM | POA: Diagnosis not present

## 2018-06-20 DIAGNOSIS — R000 Tachycardia, unspecified: Secondary | ICD-10-CM | POA: Diagnosis not present

## 2018-06-26 NOTE — Patient Instructions (Addendum)
[name] Sarno  06/26/2018   Your procedure is scheduled on: 07-15-18   Report to [facility] Main  Entrance    Report to Admitting at 5:30 AM    Call this number if you have problems the morning of surgery [phone]   Remember: Do not eat food or drink liquids :After Midnight.     Take these medicines the morning of surgery with A SIP OF WATER:  Cardizem CD, Duloxetine (Cymbalta), Metoprolol Succinate, (Toprol-XL), and your prn Clonazepam and Famotidine (Pepcid).  You may also bring and use your inhaler as needed.                                You may not have any metal on your body including hair pins and              piercings  Do not wear jewelry, make-up, lotions, powders or perfumes, deodorant             Do not wear nail polish.  Do not shave  48 hours prior to surgery.                Do not bring valuables to the hospital. [facility] IS NOT             RESPONSIBLE   FOR VALUABLES.  Contacts, dentures or bridgework may not be worn into surgery.  Leave suitcase in the car. After surgery it may be brought to your room.    Special Instructions: N/A              Please read over the following fact sheets you were given: _____________________________________________________________________             [facility] - Preparing [facility] Before surgery, you can play an important role.  Because skin is not sterile, your skin needs to be as free of germs as possible.  You can reduce the number of germs on your skin by washing with CHG (chlorahexidine gluconate) soap before surgery.  CHG is an antiseptic cleaner which kills germs and bonds with the skin to continue killing germs even after washing. Please DO NOT use if you have an allergy to CHG or antibacterial soaps.  If your skin becomes reddened/irritated stop using the CHG and inform your nurse when you arrive at Short Stay. Do not shave (including legs and underarms) for at least 48 hours  prior to the first CHG shower.  You may shave your face/neck. Please follow these instructions carefully:  1.  Shower with CHG Soap the night before surgery and the  morning of Surgery.  2.  If you choose to wash your hair, wash your hair first as usual with your  normal  shampoo.  3.  After you shampoo, rinse your hair and body thoroughly to remove the  shampoo.                           4.  Use CHG as you would any other liquid soap.  You can apply chg directly  to the skin and wash                       Gently with a scrungie or clean washcloth.  5.  Apply the CHG Soap to  your body ONLY FROM THE NECK DOWN.   Do not use on face/ open                           Wound or open sores. Avoid contact with eyes, ears mouth and genitals (private parts).                       Wash face,  Genitals (private parts) with your normal soap.             6.  Wash thoroughly, paying special attention to the area where your surgery  will be performed.  7.  Thoroughly rinse your body with warm water from the neck down.  8.  DO NOT shower/wash with your normal soap after using and rinsing off  the CHG Soap.                9.  Pat yourself dry with a clean towel.            10.  Wear clean pajamas.            11.  Place clean sheets on your bed the night of your first shower and do not  sleep with pets. Day of Surgery : Do not apply any lotions/deodorants the morning of surgery.  Please wear clean clothes to the hospital/surgery center.  FAILURE TO FOLLOW THESE INSTRUCTIONS MAY RESULT IN THE CANCELLATION OF YOUR SURGERY PATIENT SIGNATURE_________________________________  NURSE SIGNATURE__________________________________  ________________________________________________________________________

## 2018-06-26 NOTE — Progress Notes (Addendum)
06-20-18 Cardiac Clearance from Dr. Lynnea FerrierSolomon, Cardiologist in Chart Everywhere. Dr. Lynnea FerrierSolomon also indicates parameters to hold Metoprolol if Systolic is less than 100mmHG.   05-27-18 (Epic) EKG, CXR

## 2018-07-01 ENCOUNTER — Telehealth: Payer: Self-pay | Admitting: Skilled Nursing Facility1

## 2018-07-01 ENCOUNTER — Encounter (HOSPITAL_COMMUNITY): Payer: Self-pay

## 2018-07-01 ENCOUNTER — Other Ambulatory Visit: Payer: Self-pay

## 2018-07-01 ENCOUNTER — Encounter (HOSPITAL_COMMUNITY)
Admission: RE | Admit: 2018-07-01 | Discharge: 2018-07-01 | Disposition: A | Discharge status: Home / Self Care | Payer: BLUE CROSS/BLUE SHIELD | Source: Ambulatory Visit | Attending: Surgery | Admitting: Surgery

## 2018-07-01 DIAGNOSIS — Z01812 Encounter for preprocedural laboratory examination: Secondary | ICD-10-CM | POA: Diagnosis not present

## 2018-07-01 LAB — CBC
HCT: 38.8 % (ref 36.0–46.0)
Hemoglobin: 13.1 g/dL (ref 12.0–15.0)
MCH: 31.7 pg (ref 26.0–34.0)
MCHC: 33.8 g/dL (ref 30.0–36.0)
MCV: 93.9 fL (ref 78.0–100.0)
PLATELETS: 309 10*3/uL (ref 150–400)
RBC: 4.13 MIL/uL (ref 3.87–5.11)
RDW: 13.1 % (ref 11.5–15.5)
WBC: 7.9 10*3/uL (ref 4.0–10.5)

## 2018-07-01 LAB — BASIC METABOLIC PANEL
Anion gap: 13 (ref 5–15)
BUN: 17 mg/dL (ref 6–20)
CHLORIDE: 105 mmol/L (ref 98–111)
CO2: 22 mmol/L (ref 22–32)
CREATININE: 0.87 mg/dL (ref 0.44–1.00)
Calcium: 9.6 mg/dL (ref 8.9–10.3)
GFR calc Af Amer: >60 mL/min (ref >60)
GFR calc non Af Amer: >60 mL/min (ref >60)
GLUCOSE: 87 mg/dL (ref 70–99)
Potassium: 4.3 mmol/L (ref 3.5–5.1)
Sodium: 140 mmol/L (ref 135–145)

## 2018-07-01 NOTE — Telephone Encounter (Signed)
Dietitian called to ensure the pt had the knowledge she needed as far as nutrition recommendations are concerned.   Pt states she will feels confident she is ready for surgery.  Dietitian ensured she understood the diet progression via teach back.

## 2018-07-15 ENCOUNTER — Inpatient Hospital Stay (HOSPITAL_COMMUNITY): Payer: BLUE CROSS/BLUE SHIELD | Admitting: Anesthesiology

## 2018-07-15 ENCOUNTER — Other Ambulatory Visit: Payer: Self-pay

## 2018-07-15 ENCOUNTER — Encounter (HOSPITAL_COMMUNITY)
Admission: RE | Disposition: A | Discharge status: Home / Self Care | Payer: Self-pay | Source: Ambulatory Visit | Attending: Surgery

## 2018-07-15 ENCOUNTER — Inpatient Hospital Stay (HOSPITAL_COMMUNITY)
Admission: RE | Admit: 2018-07-15 | Discharge: 2018-07-17 | DRG: 620 | Disposition: A | Discharge status: Home / Self Care | Payer: BLUE CROSS/BLUE SHIELD | Source: Ambulatory Visit | Attending: Surgery | Admitting: Surgery

## 2018-07-15 ENCOUNTER — Encounter (HOSPITAL_COMMUNITY): Payer: Self-pay | Admitting: Emergency Medicine

## 2018-07-15 DIAGNOSIS — T370X5A Adverse effect of sulfonamides, initial encounter: Secondary | ICD-10-CM | POA: Diagnosis not present

## 2018-07-15 DIAGNOSIS — Z23 Encounter for immunization: Secondary | ICD-10-CM

## 2018-07-15 DIAGNOSIS — E249 Cushing's syndrome, unspecified: Secondary | ICD-10-CM | POA: Diagnosis present

## 2018-07-15 DIAGNOSIS — K900 Celiac disease: Secondary | ICD-10-CM | POA: Diagnosis present

## 2018-07-15 DIAGNOSIS — F419 Anxiety disorder, unspecified: Secondary | ICD-10-CM | POA: Diagnosis present

## 2018-07-15 DIAGNOSIS — Z6841 Body Mass Index (BMI) 40.0 and over, adult: Secondary | ICD-10-CM | POA: Diagnosis not present

## 2018-07-15 DIAGNOSIS — F329 Major depressive disorder, single episode, unspecified: Secondary | ICD-10-CM | POA: Diagnosis not present

## 2018-07-15 DIAGNOSIS — K449 Diaphragmatic hernia without obstruction or gangrene: Secondary | ICD-10-CM | POA: Diagnosis not present

## 2018-07-15 DIAGNOSIS — Z8249 Family history of ischemic heart disease and other diseases of the circulatory system: Secondary | ICD-10-CM | POA: Diagnosis not present

## 2018-07-15 DIAGNOSIS — Z9884 Bariatric surgery status: Secondary | ICD-10-CM

## 2018-07-15 HISTORY — PX: LAPAROSCOPIC GASTRIC SLEEVE RESECTION: SHX5895

## 2018-07-15 LAB — COMPREHENSIVE METABOLIC PANEL
ALT: 23 U/L (ref 0–44)
AST: 23 U/L (ref 15–41)
Albumin: 3.8 g/dL (ref 3.5–5.0)
Alkaline Phosphatase: 59 U/L (ref 38–126)
Anion gap: 11 (ref 5–15)
BILIRUBIN TOTAL: 0.7 mg/dL (ref 0.3–1.2)
BUN: 7 mg/dL (ref 6–20)
CO2: 25 mmol/L (ref 22–32)
CREATININE: 0.83 mg/dL (ref 0.44–1.00)
Calcium: 9.3 mg/dL (ref 8.9–10.3)
Chloride: 103 mmol/L (ref 98–111)
GFR calc Af Amer: >60 mL/min (ref >60)
Glucose, Bld: 103 mg/dL — ABNORMAL HIGH (ref 70–99)
Potassium: 3.7 mmol/L (ref 3.5–5.1)
Sodium: 139 mmol/L (ref 135–145)
TOTAL PROTEIN: 6.9 g/dL (ref 6.5–8.1)

## 2018-07-15 LAB — CBC WITH DIFFERENTIAL/PLATELET
Basophils Absolute: 0 10*3/uL (ref 0.0–0.1)
Basophils Relative: 0 %
EOS PCT: 2 %
Eosinophils Absolute: 0.1 10*3/uL (ref 0.0–0.7)
HEMATOCRIT: 37.5 % (ref 36.0–46.0)
Hemoglobin: 12.5 g/dL (ref 12.0–15.0)
Lymphocytes Relative: 32 %
Lymphs Abs: 1.7 10*3/uL (ref 0.7–4.0)
MCH: 31.8 pg (ref 26.0–34.0)
MCHC: 33.3 g/dL (ref 30.0–36.0)
MCV: 95.4 fL (ref 78.0–100.0)
MONO ABS: 0.6 10*3/uL (ref 0.1–1.0)
MONOS PCT: 10 %
NEUTROS ABS: 3.1 10*3/uL (ref 1.7–7.7)
Neutrophils Relative %: 56 %
PLATELETS: 218 10*3/uL (ref 150–400)
RBC: 3.93 MIL/uL (ref 3.87–5.11)
RDW: 13 % (ref 11.5–15.5)
WBC: 5.5 10*3/uL (ref 4.0–10.5)

## 2018-07-15 LAB — TYPE AND SCREEN
ABO/RH(D): AB POS
Antibody Screen: NEGATIVE

## 2018-07-15 LAB — HEMOGLOBIN AND HEMATOCRIT, BLOOD
HCT: 38 % (ref 36.0–46.0)
HEMOGLOBIN: 12.5 g/dL (ref 12.0–15.0)

## 2018-07-15 LAB — ABO/RH: ABO/RH(D): AB POS

## 2018-07-15 SURGERY — GASTRECTOMY, SLEEVE, LAPAROSCOPIC
Anesthesia: General | Site: Abdomen

## 2018-07-15 MED ORDER — CHLORHEXIDINE GLUCONATE CLOTH 2 % EX PADS: 6.0000 | MEDICATED_PAD | Freq: Once | CUTANEOUS | Status: DC

## 2018-07-15 MED ORDER — SIMETHICONE 80 MG PO CHEW: 80.0000 mg | CHEWABLE_TABLET | Freq: Four times a day (QID) | ORAL | Status: DC | PRN

## 2018-07-15 MED ORDER — SODIUM CHLORIDE 0.9 % IJ SOLN
INTRAMUSCULAR | Status: DC | PRN
  Administered 2018-07-15: 10 mL

## 2018-07-15 MED ORDER — ONDANSETRON HCL 4 MG/2ML IJ SOLN
4.0000 mg | Freq: Once | INTRAMUSCULAR | Status: AC | PRN
  Administered 2018-07-15: 4 mg via INTRAVENOUS

## 2018-07-15 MED ORDER — HEPARIN SODIUM (PORCINE) 5000 UNIT/ML IJ SOLN
5000.0000 [IU] | INTRAMUSCULAR | Status: AC
  Administered 2018-07-15: 5000 [IU] via SUBCUTANEOUS
  Filled 2018-07-15: qty 1

## 2018-07-15 MED ORDER — FENTANYL CITRATE (PF) 100 MCG/2ML IJ SOLN
INTRAMUSCULAR | Status: AC
  Filled 2018-07-15: qty 2

## 2018-07-15 MED ORDER — SODIUM CHLORIDE 0.9 % IV SOLN
2.0000 g | INTRAVENOUS | Status: AC
  Administered 2018-07-15: 2 g via INTRAVENOUS
  Filled 2018-07-15: qty 2

## 2018-07-15 MED ORDER — DEXAMETHASONE SODIUM PHOSPHATE 10 MG/ML IJ SOLN
INTRAMUSCULAR | Status: AC
  Filled 2018-07-15: qty 2

## 2018-07-15 MED ORDER — ALBUTEROL SULFATE HFA 108 (90 BASE) MCG/ACT IN AERS: 1.0000 | INHALATION_SPRAY | Freq: Four times a day (QID) | RESPIRATORY_TRACT | Status: DC | PRN

## 2018-07-15 MED ORDER — LIDOCAINE HCL 2 % IJ SOLN
INTRAMUSCULAR | Status: AC
  Filled 2018-07-15: qty 20

## 2018-07-15 MED ORDER — LACTATED RINGERS IV SOLN
INTRAVENOUS | Status: DC
  Administered 2018-07-15: 06:00:00 via INTRAVENOUS

## 2018-07-15 MED ORDER — LIDOCAINE 2% (20 MG/ML) 5 ML SYRINGE
INTRAMUSCULAR | Status: AC
  Filled 2018-07-15: qty 10

## 2018-07-15 MED ORDER — METOPROLOL SUCCINATE ER 25 MG PO TB24
25.0000 mg | ORAL_TABLET | Freq: Every day | ORAL | Status: DC
  Administered 2018-07-16: 25 mg via ORAL
  Filled 2018-07-15: qty 1

## 2018-07-15 MED ORDER — ACETAMINOPHEN 500 MG PO TABS
1000.0000 mg | ORAL_TABLET | ORAL | Status: AC
  Administered 2018-07-15: 1000 mg via ORAL
  Filled 2018-07-15: qty 2

## 2018-07-15 MED ORDER — SUCCINYLCHOLINE CHLORIDE 200 MG/10ML IV SOSY
PREFILLED_SYRINGE | INTRAVENOUS | Status: DC | PRN
  Administered 2018-07-15: 140 mg via INTRAVENOUS

## 2018-07-15 MED ORDER — MIDAZOLAM HCL 2 MG/2ML IJ SOLN
INTRAMUSCULAR | Status: AC
  Filled 2018-07-15: qty 2

## 2018-07-15 MED ORDER — SUGAMMADEX SODIUM 500 MG/5ML IV SOLN
INTRAVENOUS | Status: AC
  Filled 2018-07-15: qty 5

## 2018-07-15 MED ORDER — ONDANSETRON HCL 4 MG/2ML IJ SOLN
INTRAMUSCULAR | Status: AC
  Filled 2018-07-15: qty 2

## 2018-07-15 MED ORDER — PROPOFOL 10 MG/ML IV BOLUS
INTRAVENOUS | Status: DC | PRN
  Administered 2018-07-15: 160 mg via INTRAVENOUS

## 2018-07-15 MED ORDER — DEXAMETHASONE SODIUM PHOSPHATE 4 MG/ML IJ SOLN: 4.0000 mg | INTRAMUSCULAR | Status: DC

## 2018-07-15 MED ORDER — KCL IN DEXTROSE-NACL 20-5-0.45 MEQ/L-%-% IV SOLN
INTRAVENOUS | Status: DC
  Administered 2018-07-15 – 2018-07-17 (×4): via INTRAVENOUS
  Filled 2018-07-15 (×5): qty 1000

## 2018-07-15 MED ORDER — ARTIFICIAL TEARS OP OINT
TOPICAL_OINTMENT | OPHTHALMIC | Status: AC
  Filled 2018-07-15: qty 3.5

## 2018-07-15 MED ORDER — MIDAZOLAM HCL 5 MG/5ML IJ SOLN
INTRAMUSCULAR | Status: DC | PRN
  Administered 2018-07-15: 2 mg via INTRAVENOUS

## 2018-07-15 MED ORDER — METOPROLOL SUCCINATE ER 50 MG PO TB24
75.0000 mg | ORAL_TABLET | ORAL | Status: DC
  Administered 2018-07-15 – 2018-07-17 (×4): 75 mg via ORAL
  Filled 2018-07-15 (×4): qty 1

## 2018-07-15 MED ORDER — LABETALOL HCL 5 MG/ML IV SOLN
INTRAVENOUS | Status: AC
  Filled 2018-07-15: qty 8

## 2018-07-15 MED ORDER — BUPIVACAINE LIPOSOME 1.3 % IJ SUSP
20.0000 mL | Freq: Once | INTRAMUSCULAR | Status: DC
  Filled 2018-07-15: qty 20

## 2018-07-15 MED ORDER — PROMETHAZINE HCL 25 MG/ML IJ SOLN
12.5000 mg | Freq: Once | INTRAMUSCULAR | Status: AC
  Administered 2018-07-15: 12.5 mg via INTRAVENOUS

## 2018-07-15 MED ORDER — HYDROMORPHONE HCL 1 MG/ML IJ SOLN
INTRAMUSCULAR | Status: AC
  Filled 2018-07-15: qty 2

## 2018-07-15 MED ORDER — OXYCODONE HCL 5 MG/5ML PO SOLN
5.0000 mg | ORAL | Status: DC | PRN
  Administered 2018-07-16 (×3): 10 mg via ORAL
  Administered 2018-07-16: 5 mg via ORAL
  Administered 2018-07-16 – 2018-07-17 (×3): 10 mg via ORAL
  Filled 2018-07-15 (×7): qty 10

## 2018-07-15 MED ORDER — LIDOCAINE 2% (20 MG/ML) 5 ML SYRINGE
INTRAMUSCULAR | Status: DC | PRN
  Administered 2018-07-15: 100 mg via INTRAVENOUS

## 2018-07-15 MED ORDER — ALBUTEROL SULFATE (2.5 MG/3ML) 0.083% IN NEBU: 2.5000 mg | INHALATION_SOLUTION | Freq: Four times a day (QID) | RESPIRATORY_TRACT | Status: DC | PRN

## 2018-07-15 MED ORDER — ACETAMINOPHEN 160 MG/5ML PO SOLN
650.0000 mg | Freq: Four times a day (QID) | ORAL | Status: DC
  Administered 2018-07-15 – 2018-07-17 (×7): 650 mg via ORAL
  Filled 2018-07-15 (×7): qty 20.3

## 2018-07-15 MED ORDER — APREPITANT 40 MG PO CAPS
40.0000 mg | ORAL_CAPSULE | ORAL | Status: AC
  Administered 2018-07-15: 40 mg via ORAL
  Filled 2018-07-15: qty 1

## 2018-07-15 MED ORDER — LACTATED RINGERS IR SOLN
Status: DC | PRN
  Administered 2018-07-15: 1000 mL

## 2018-07-15 MED ORDER — SUCCINYLCHOLINE CHLORIDE 200 MG/10ML IV SOSY
PREFILLED_SYRINGE | INTRAVENOUS | Status: AC
  Filled 2018-07-15: qty 10

## 2018-07-15 MED ORDER — PREMIER PROTEIN SHAKE
2.0000 [oz_av] | ORAL | Status: DC
  Administered 2018-07-16 – 2018-07-17 (×6): 2 [oz_av] via ORAL

## 2018-07-15 MED ORDER — MORPHINE SULFATE (PF) 2 MG/ML IV SOLN
1.0000 mg | INTRAVENOUS | Status: DC | PRN
  Administered 2018-07-15 (×2): 3 mg via INTRAVENOUS
  Administered 2018-07-15 – 2018-07-17 (×6): 2 mg via INTRAVENOUS
  Filled 2018-07-15: qty 1
  Filled 2018-07-15: qty 2
  Filled 2018-07-15: qty 1
  Filled 2018-07-15: qty 2
  Filled 2018-07-15 (×4): qty 1

## 2018-07-15 MED ORDER — EPHEDRINE 5 MG/ML INJ
INTRAVENOUS | Status: AC
  Filled 2018-07-15: qty 10

## 2018-07-15 MED ORDER — HYDROMORPHONE HCL 1 MG/ML IJ SOLN
0.2500 mg | INTRAMUSCULAR | Status: DC | PRN
  Administered 2018-07-15 (×3): 0.5 mg via INTRAVENOUS

## 2018-07-15 MED ORDER — SUGAMMADEX SODIUM 500 MG/5ML IV SOLN
INTRAVENOUS | Status: DC | PRN
  Administered 2018-07-15: 400 mg via INTRAVENOUS

## 2018-07-15 MED ORDER — DULOXETINE HCL 30 MG PO CPEP
30.0000 mg | ORAL_CAPSULE | Freq: Every day | ORAL | Status: DC
  Administered 2018-07-16 – 2018-07-17 (×2): 30 mg via ORAL
  Filled 2018-07-15 (×2): qty 1

## 2018-07-15 MED ORDER — GLYCOPYRROLATE PF 0.2 MG/ML IJ SOSY
PREFILLED_SYRINGE | INTRAMUSCULAR | Status: AC
  Filled 2018-07-15: qty 2

## 2018-07-15 MED ORDER — ROCURONIUM BROMIDE 10 MG/ML (PF) SYRINGE
PREFILLED_SYRINGE | INTRAVENOUS | Status: AC
  Filled 2018-07-15: qty 10

## 2018-07-15 MED ORDER — KETAMINE HCL 10 MG/ML IJ SOLN
INTRAMUSCULAR | Status: DC | PRN
  Administered 2018-07-15: 20 mg via INTRAVENOUS
  Administered 2018-07-15 (×2): 10 mg via INTRAVENOUS

## 2018-07-15 MED ORDER — PROMETHAZINE HCL 25 MG/ML IJ SOLN
INTRAMUSCULAR | Status: AC
  Filled 2018-07-15: qty 1

## 2018-07-15 MED ORDER — HEPARIN SODIUM (PORCINE) 5000 UNIT/ML IJ SOLN
5000.0000 [IU] | Freq: Three times a day (TID) | INTRAMUSCULAR | Status: DC
  Administered 2018-07-15 – 2018-07-17 (×5): 5000 [IU] via SUBCUTANEOUS
  Filled 2018-07-15 (×5): qty 1

## 2018-07-15 MED ORDER — DILTIAZEM HCL ER COATED BEADS 180 MG PO CP24
180.0000 mg | ORAL_CAPSULE | Freq: Every day | ORAL | Status: DC
  Administered 2018-07-16 – 2018-07-17 (×2): 180 mg via ORAL
  Filled 2018-07-15 (×2): qty 1

## 2018-07-15 MED ORDER — ARTIFICIAL TEARS OPHTHALMIC OINT
TOPICAL_OINTMENT | OPHTHALMIC | Status: DC | PRN
  Administered 2018-07-15: 1 via OPHTHALMIC

## 2018-07-15 MED ORDER — DEXAMETHASONE SODIUM PHOSPHATE 10 MG/ML IJ SOLN
INTRAMUSCULAR | Status: DC | PRN
  Administered 2018-07-15: 4 mg via INTRAVENOUS

## 2018-07-15 MED ORDER — ROCURONIUM BROMIDE 10 MG/ML (PF) SYRINGE
PREFILLED_SYRINGE | INTRAVENOUS | Status: AC
  Filled 2018-07-15: qty 20

## 2018-07-15 MED ORDER — SCOPOLAMINE 1 MG/3DAYS TD PT72
1.0000 | MEDICATED_PATCH | TRANSDERMAL | Status: DC
  Administered 2018-07-15: 1.5 mg via TRANSDERMAL
  Filled 2018-07-15: qty 1

## 2018-07-15 MED ORDER — PHENYLEPHRINE 40 MCG/ML (10ML) SYRINGE FOR IV PUSH (FOR BLOOD PRESSURE SUPPORT)
PREFILLED_SYRINGE | INTRAVENOUS | Status: AC
  Filled 2018-07-15: qty 10

## 2018-07-15 MED ORDER — ROCURONIUM BROMIDE 10 MG/ML (PF) SYRINGE
PREFILLED_SYRINGE | INTRAVENOUS | Status: DC | PRN
  Administered 2018-07-15: 60 mg via INTRAVENOUS
  Administered 2018-07-15: 20 mg via INTRAVENOUS
  Administered 2018-07-15 (×3): 10 mg via INTRAVENOUS

## 2018-07-15 MED ORDER — HYDRALAZINE HCL 20 MG/ML IJ SOLN: 10.0000 mg | INTRAMUSCULAR | Status: DC | PRN

## 2018-07-15 MED ORDER — LIDOCAINE 2% (20 MG/ML) 5 ML SYRINGE
INTRAMUSCULAR | Status: DC | PRN
  Administered 2018-07-15: 1.5 mg/kg/h via INTRAVENOUS

## 2018-07-15 MED ORDER — PROPOFOL 10 MG/ML IV BOLUS
INTRAVENOUS | Status: AC
  Filled 2018-07-15: qty 40

## 2018-07-15 MED ORDER — EPHEDRINE SULFATE-NACL 50-0.9 MG/10ML-% IV SOSY
PREFILLED_SYRINGE | INTRAVENOUS | Status: DC | PRN
  Administered 2018-07-15 (×2): 5 mg via INTRAVENOUS
  Administered 2018-07-15: 10 mg via INTRAVENOUS

## 2018-07-15 MED ORDER — MEPERIDINE HCL 50 MG/ML IJ SOLN: 6.2500 mg | INTRAMUSCULAR | Status: DC | PRN

## 2018-07-15 MED ORDER — METOPROLOL SUCCINATE ER 50 MG PO TB24: 50.0000 mg | ORAL_TABLET | ORAL | Status: DC

## 2018-07-15 MED ORDER — BUPIVACAINE LIPOSOME 1.3 % IJ SUSP
INTRAMUSCULAR | Status: DC | PRN
  Administered 2018-07-15: 20 mL

## 2018-07-15 MED ORDER — FENTANYL CITRATE (PF) 100 MCG/2ML IJ SOLN
INTRAMUSCULAR | Status: DC | PRN
  Administered 2018-07-15: 100 ug via INTRAVENOUS

## 2018-07-15 MED ORDER — DESMOPRESSIN ACETATE 1.5 MG/ML NA SOLN: 1.0000 | Freq: Once | NASAL | Status: DC

## 2018-07-15 MED ORDER — SODIUM CHLORIDE 0.9 % IV SOLN
35.0000 ug | Freq: Once | INTRAVENOUS | Status: AC
  Administered 2018-07-15: 35 ug via INTRAVENOUS
  Filled 2018-07-15: qty 8.75

## 2018-07-15 MED ORDER — FAMOTIDINE IN NACL 20-0.9 MG/50ML-% IV SOLN
20.0000 mg | Freq: Two times a day (BID) | INTRAVENOUS | Status: DC
  Administered 2018-07-15 – 2018-07-16 (×4): 20 mg via INTRAVENOUS
  Filled 2018-07-15 (×4): qty 50

## 2018-07-15 MED ORDER — ONDANSETRON HCL 4 MG/2ML IJ SOLN
INTRAMUSCULAR | Status: DC | PRN
  Administered 2018-07-15 (×2): 4 mg via INTRAVENOUS

## 2018-07-15 MED ORDER — SUGAMMADEX SODIUM 200 MG/2ML IV SOLN
INTRAVENOUS | Status: AC
  Filled 2018-07-15: qty 2

## 2018-07-15 MED ORDER — KETAMINE HCL 10 MG/ML IJ SOLN
INTRAMUSCULAR | Status: AC
  Filled 2018-07-15: qty 1

## 2018-07-15 MED ORDER — ONDANSETRON HCL 4 MG/2ML IJ SOLN
4.0000 mg | INTRAMUSCULAR | Status: DC | PRN
  Administered 2018-07-15 – 2018-07-16 (×5): 4 mg via INTRAVENOUS
  Filled 2018-07-15 (×6): qty 2

## 2018-07-15 MED ORDER — PHENYLEPHRINE 40 MCG/ML (10ML) SYRINGE FOR IV PUSH (FOR BLOOD PRESSURE SUPPORT)
PREFILLED_SYRINGE | INTRAVENOUS | Status: AC
  Filled 2018-07-15: qty 20

## 2018-07-15 SURGICAL SUPPLY — 63 items
ADH SKN CLS APL DERMABOND .7 (GAUZE/BANDAGES/DRESSINGS) ×1
APL SWBSTK 6 STRL LF DISP (MISCELLANEOUS)
APPLICATOR COTTON TIP 6 STRL (MISCELLANEOUS) IMPLANT
APPLICATOR COTTON TIP 6IN STRL (MISCELLANEOUS)
APPLIER CLIP 5 13 M/L LIGAMAX5 (MISCELLANEOUS)
APPLIER CLIP ROT 10 11.4 M/L (STAPLE)
APPLIER CLIP ROT 13.4 12 LRG (CLIP)
APR CLP LRG 13.4X12 ROT 20 MLT (CLIP)
APR CLP MED LRG 11.4X10 (STAPLE)
APR CLP MED LRG 5 ANG JAW (MISCELLANEOUS)
BLADE SURG 15 STRL LF DISP TIS (BLADE) ×1 IMPLANT
BLADE SURG 15 STRL SS (BLADE) ×2
CABLE HIGH FREQUENCY MONO STRZ (ELECTRODE) ×2 IMPLANT
CLIP APPLIE 5 13 M/L LIGAMAX5 (MISCELLANEOUS) IMPLANT
CLIP APPLIE ROT 10 11.4 M/L (STAPLE) IMPLANT
CLIP APPLIE ROT 13.4 12 LRG (CLIP) IMPLANT
DERMABOND ADVANCED (GAUZE/BANDAGES/DRESSINGS) ×1
DERMABOND ADVANCED .7 DNX12 (GAUZE/BANDAGES/DRESSINGS) IMPLANT
DEVICE SUT QUICK LOAD TK 5 (STAPLE) ×1 IMPLANT
DEVICE SUT TI-KNOT TK 5X26 (MISCELLANEOUS) ×1 IMPLANT
DEVICE SUTURE ENDOST 10MM (ENDOMECHANICALS) ×1 IMPLANT
DISSECTOR BLUNT TIP ENDO 5MM (MISCELLANEOUS) ×1 IMPLANT
ELECT REM PT RETURN 15FT ADLT (MISCELLANEOUS) ×2 IMPLANT
GAUZE SPONGE 4X4 12PLY STRL (GAUZE/BANDAGES/DRESSINGS) IMPLANT
GLOVE BIOGEL M 8.0 STRL (GLOVE) ×2 IMPLANT
GOWN STRL REUS W/TWL XL LVL3 (GOWN DISPOSABLE) ×8 IMPLANT
GRASPER SUT TROCAR 14GX15 (MISCELLANEOUS) ×2 IMPLANT
HANDLE STAPLE EGIA 4 XL (STAPLE) ×2 IMPLANT
HOVERMATT SINGLE USE (MISCELLANEOUS) ×2 IMPLANT
KIT BASIN OR (CUSTOM PROCEDURE TRAY) ×2 IMPLANT
MARKER SKIN DUAL TIP RULER LAB (MISCELLANEOUS) ×2 IMPLANT
NDL SPNL 22GX3.5 QUINCKE BK (NEEDLE) ×1 IMPLANT
NEEDLE SPNL 22GX3.5 QUINCKE BK (NEEDLE) ×2 IMPLANT
PACK UNIVERSAL I (CUSTOM PROCEDURE TRAY) ×2 IMPLANT
RELOAD STAPLE 45 PURP MED/THCK (STAPLE) IMPLANT
RELOAD TRI 45 ART MED THCK BLK (STAPLE) ×2 IMPLANT
RELOAD TRI 45 ART MED THCK PUR (STAPLE) IMPLANT
RELOAD TRI 60 ART MED THCK BLK (STAPLE) ×2 IMPLANT
RELOAD TRI 60 ART MED THCK PUR (STAPLE) ×3 IMPLANT
SCISSORS LAP 5X45 EPIX DISP (ENDOMECHANICALS) ×1 IMPLANT
SET IRRIG TUBING LAPAROSCOPIC (IRRIGATION / IRRIGATOR) ×2 IMPLANT
SHEARS HARMONIC ACE PLUS 45CM (MISCELLANEOUS) ×2 IMPLANT
SLEEVE ADV FIXATION 5X100MM (TROCAR) ×4 IMPLANT
SLEEVE GASTRECTOMY 36FR VISIGI (MISCELLANEOUS) ×2 IMPLANT
SOLUTION ANTI FOG 6CC (MISCELLANEOUS) ×2 IMPLANT
SPONGE LAP 18X18 RF (DISPOSABLE) ×2 IMPLANT
STAPLER VISISTAT 35W (STAPLE) ×2 IMPLANT
SUT MNCRL AB 4-0 PS2 18 (SUTURE) ×4 IMPLANT
SUT SURGIDAC NAB ES-9 0 48 120 (SUTURE) ×1 IMPLANT
SUT VICRYL 0 TIES 12 18 (SUTURE) ×2 IMPLANT
SYR 10ML ECCENTRIC (SYRINGE) ×2 IMPLANT
SYR 20CC LL (SYRINGE) ×2 IMPLANT
SYR 50ML LL SCALE MARK (SYRINGE) ×2 IMPLANT
TOWEL OR 17X26 10 PK STRL BLUE (TOWEL DISPOSABLE) ×4 IMPLANT
TOWEL OR NON WOVEN STRL DISP B (DISPOSABLE) ×2 IMPLANT
TRAY FOLEY MTR SLVR 16FR STAT (SET/KITS/TRAYS/PACK) IMPLANT
TROCAR ADV FIXATION 5X100MM (TROCAR) ×2 IMPLANT
TROCAR BLADELESS 15MM (ENDOMECHANICALS) ×2 IMPLANT
TROCAR BLADELESS OPT 5 100 (ENDOMECHANICALS) ×2 IMPLANT
TUBE CALIBRATION LAPBAND (TUBING) ×1 IMPLANT
TUBING CONNECTING 10 (TUBING) ×3 IMPLANT
TUBING ENDO SMARTCAP (MISCELLANEOUS) ×2 IMPLANT
TUBING INSUF HEATED (TUBING) ×2 IMPLANT

## 2018-07-15 NOTE — Interval H&P Note (Signed)
History and Physical Interval Note:  07/15/2018 7:09 AM  Kaitlyn Johnson  has presented today for surgery, with the diagnosis of Morbid Obesity, GERD, N/V, S/P Roux-en-Y Jejunostomy  The various methods of treatment have been discussed with the patient and family. After consideration of risks, benefits and other options for treatment, the patient has consented to  Procedure(s): LAPAROSCOPIC GASTRIC SLEEVE RESECTION, UPPER ENDO, ERAS Pathway (N/A) as a surgical intervention .  The patient's history has been reviewed, patient examined, no change in status, stable for surgery.  I have reviewed the patient's chart and labs.  Questions were answered to the patient's satisfaction.     Pedro Earls

## 2018-07-15 NOTE — Progress Notes (Signed)
Discussed post op day goals with patient including ambulation, IS, diet progression, pain, and nausea control.  Questions answered. 

## 2018-07-15 NOTE — Discharge Instructions (Signed)
° ° ° °GASTRIC BYPASS/SLEEVE ° Home Care Instructions ° ° These instructions are to help you care for yourself when you go home. ° °Call: If you have any problems. °• Call [phone] and ask for the surgeon on call °• If you need immediate help, come to the ER at [facility].  °• Tell the ER staff that you are a new post-op gastric bypass or gastric sleeve patient °  °Signs and symptoms to report: • Severe vomiting or nausea °o If you cannot keep down clear liquids for longer than 1 day, call your surgeon  °• Abdominal pain that does not get better after taking your pain medication °• Fever over 100.4° F with chills °• Heart beating over 100 beats a minute °• Shortness of breath at rest °• Chest pain °•  Redness, swelling, drainage, or foul odor at incision (surgical) sites °•  If your incisions open or pull apart °• Swelling or pain in calf (lower leg) °• Diarrhea (Loose bowel movements that happen often), frequent watery, uncontrolled bowel movements °• Constipation, (no bowel movements for 3 days) if this happens: Pick one °o Milk of Magnesia, 2 tablespoons by mouth, 3 times a day for 2 days if needed °o Stop taking Milk of Magnesia once you have a bowel movement °o Call your doctor if constipation continues °Or °o Miralax  (instead of Milk of Magnesia) following the label instructions °o Stop taking Miralax once you have a bowel movement °o Call your doctor if constipation continues °• Anything you think is not normal °  °Normal side effects after surgery: • Unable to sleep at night or unable to focus °• Irritability or moody °• Being tearful (crying) or depressed °These are common complaints, possibly related to your anesthesia medications that put you to sleep, stress of surgery, and change in lifestyle.  This usually goes away a few weeks after surgery.  If these feelings continue, call your primary care doctor. °  °Wound Care: You may have surgical glue, steri-strips, or staples over your incisions after  surgery °• Surgical glue:  Looks like a clear film over your incisions and will wear off a little at a time °• Steri-strips: Strips of tape over your incisions. You may notice a yellowish color on the skin under the steri-strips. This is used to make the   steri-strips stick better. Do not pull the steri-strips off - let them fall off °• Staples: Staples may be removed before you leave the hospital °o If you go home with staples, call [facility], [phone] at for an appointment with your surgeon’s nurse to have staples removed 10 days after surgery. °• Showering: You may shower two (2) days after your surgery unless your surgeon tells you differently °o Wash gently around incisions with warm soapy water, rinse well, and gently pat dry  °o No tub baths until staples are removed, steri-strips fall off or glue is gone.  °  °Medications: • Medications should be liquid or crushed if larger than the size of a dime °• Extended release pills (medication that release a little bit at a time through the day) should NOT be crushed or cut. (examples include XL, ER, DR, SR) °• Depending on the size and number of medications you take, you may need to space (take a few throughout the day)/change the time you take your medications so that you do not over-fill your pouch (smaller stomach) °• Make sure you follow-up with your primary care doctor to   make medication changes needed during rapid weight loss and life-style changes °• If you have diabetes, follow up with the doctor that orders your diabetes medication(s) within one week after surgery and check your blood sugar regularly. °• Do not drive while taking prescription pain medication  °• It is ok to take Tylenol by the bottle instructions with your pain medicine or instead of your pain medicine as needed.  DO NOT TAKE NSAIDS (EXAMPLES OF NSAIDS:  IBUPROFREN/ NAPROXEN)  °Diet:                    First 2 Weeks ° You will see the dietician t about two (2) weeks  after your surgery. The dietician will increase the types of foods you can eat if you are handling liquids well: °• If you have severe vomiting or nausea and cannot keep down clear liquids lasting longer than 1 day, call your surgeon @ ([phone]) °Protein Shake °• Drink at least 2 ounces of shake 5-6 times per day °• Each serving of protein shakes (usually 8 - 12 ounces) should have: °o 15 grams of protein  °o And no more than 5 grams of carbohydrate  °• Goal for protein each day: °o Men = 80 grams per day °o Women = 60 grams per day °• Protein powder may be added to fluids such as non-fat milk or Lactaid milk or unsweetened Soy/Almond milk (limit to 35 grams added protein powder per serving) ° °Hydration °• Slowly increase the amount of water and other clear liquids as tolerated (See Acceptable Fluids) °• Slowly increase the amount of protein shake as tolerated  °•  Sip fluids slowly and throughout the day.  Do not use straws. °• May use sugar substitutes in small amounts (no more than 6 - 8 packets per day; i.e. Splenda) ° °Fluid Goal °• The first goal is to drink at least 8 ounces of protein shake/drink per day (or as directed by the nutritionist); some examples of protein shakes are Syntrax Nectar, Adkins Advantage, EAS Edge HP, and Unjury. See handout from pre-op Bariatric Education Class: °o Slowly increase the amount of protein shake you drink as tolerated °o You may find it easier to slowly sip shakes throughout the day °o It is important to get your proteins in first °• Your fluid goal is to drink 64 - 100 ounces of fluid daily °o It may take a few weeks to build up to this °• 32 oz (or more) should be clear liquids  °And  °• 32 oz (or more) should be full liquids (see below for examples) °• Liquids should not contain sugar, caffeine, or carbonation ° °Clear Liquids: °• Water or Sugar-free flavored water (i.e. Fruit H2O, Propel) °• Decaffeinated coffee or tea (sugar-free) °• Crystal Lite, Wyler’s Lite,  Minute Maid Lite °• Sugar-free Jell-O °• Bouillon or broth °• Sugar-free Popsicle:   *Less than 20 calories each; Limit 1 per day ° °Full Liquids: °Protein Shakes/Drinks + 2 choices per day of other full liquids °• Full liquids must be: °o No More Than 15 grams of Carbs per serving  °o No More Than 3 grams of Fat per serving °• Strained low-fat cream soup (except Cream of Potato or Tomato) °• Non-Fat milk °• Fat-free Lactaid Milk °• Unsweetened Soy Or Unsweetened Almond Milk °• Low Sugar yogurt (Dannon Lite & Fit, Greek yogurt; Oikos Triple Zero; Chobani Simply 100; Yoplait 100 calorie Greek - No Fruit on the Bottom) ° °  °Vitamins   and Minerals • Start 1 day after surgery unless otherwise directed by your surgeon °• 2 Chewable Bariatric Specific Multivitamin / Multimineral Supplement with iron (Example: Bariatric Advantage Multi EA) °• Chewable Calcium with Vitamin D-3 °(Example: 3 Chewable Calcium Plus 600 with Vitamin D-3) °o Take 500 mg three (3) times a day for a total of 1500 mg each day °o Do not take all 3 doses of calcium at one time as it may cause constipation, and you can only absorb 500 mg  at a time  °o Do not mix multivitamins containing iron with calcium supplements; take 2 hours apart °• Menstruating women and those with a history of anemia (a blood disease that causes weakness) may need extra iron °o Talk with your doctor to see if you need more iron °• Do not stop taking or change any vitamins or minerals until you talk to your dietitian or surgeon °• Your Dietitian and/or surgeon must approve all vitamin and mineral supplements °  °Activity and Exercise: Limit your physical activity as instructed by your doctor.  It is important to continue walking at home.  During this time, use these guidelines: °• Do not lift anything greater than ten (10) pounds for at least two (2) weeks °• Do not go back to work or drive until your surgeon says you can °• You may have sex when you feel comfortable  °o It is  VERY important for female patients to use a reliable birth control method; fertility often increases after surgery  °o All hormonal birth control will be ineffective for 30 days after surgery due to medications given during surgery a barrier method must be used. °o Do not get pregnant for at least 18 months °• Start exercising as soon as your doctor tells you that you can °o Make sure your doctor approves any physical activity °• Start with a simple walking program °• Walk 5-15 minutes each day, 7 days per week.  °• Slowly increase until you are walking 30-45 minutes per day °Consider joining our BELT program. [phone] or email [email or web address] °  °Special Instructions Things to remember: °• Use your CPAP when sleeping if this applies to you ° °• [facility] has two free Bariatric Surgery Support Groups that meet monthly °o The 3rd Thursday of each month, 6 pm, [facility]  °o The 2nd Friday of each month, 11:45 am in the private dining room in the basement of  °• It is very important to keep all follow up appointments with your surgeon, dietitian, primary care physician, and behavioral health practitioner °• Routine follow up schedule with your surgeon include appointments at 2-3 weeks, 6-8 weeks, 6 months, and 1 year at a minimum.  Your surgeon may request to see you more often.   °o After the first year, please follow up with your bariatric surgeon and dietitian at least once a year in order to maintain best weight loss results °[facility]: [phone] °[facility]: [phone] °Bariatric Nurse Coordinator: [phone] °  °   Reviewed and Endorsed  °by [facility] Patient Education Committee, June, 2016 °Edits Approved: Aug, 2018 ° ° ° °

## 2018-07-15 NOTE — Progress Notes (Signed)
PHARMACY CONSULT FOR:  Risk Assessment for Post-Discharge VTE Following Bariatric Surgery  Post-Discharge VTE Risk Assessment: This patient's probability of 30-day post-discharge VTE is increased due to the factors marked:   Female    Age >/=60 years    BMI >/=50 kg/m2    CHF    Dyspnea at Rest    Paraplegia  x  Non-gastric-band surgery    Operation Time >/=3 hr    Return to OR     Length of Stay >/= 3 d   Predicted probability of 30-day post-discharge VTE: 0.16%  Other patient-specific factors to consider: n/a   Recommendation for Discharge: No pharmacologic prophylaxis post-discharge    Kaitlyn Johnson is a 37 y.o. female who underwent  laparoscopic sleeve gastrectomy on 9/17   Case start: 0805 Case end: 0946   Allergies  Allergen Reactions  . Bee Venom Anaphylaxis  . Reglan [Metoclopramide] Other (See Comments)    Reaction:  Oculogyric crisis   . Shellfish Allergy Anaphylaxis  . Wheat Bran Other (See Comments)    Pt has celiac disease.    . Adhesive [Tape] Hives  . Gluten Meal Other (See Comments)    Pt has celiac disease.   . Sulfa Antibiotics Rash  . Tartrazine Rash  . Yellow Dye Rash    Patient Measurements: Height: 5\' 8"  (172.7 cm) Weight: 262 lb 12.8 oz (119.2 kg) IBW/kg (Calculated) : 63.9 Body mass index is 39.96 kg/m.  Recent Labs    07/15/18 0610  WBC 5.5  HGB 12.5  HCT 37.5  PLT 218  CREATININE 0.83  ALBUMIN 3.8  PROT 6.9  AST 23  ALT 23  ALKPHOS 59  BILITOT 0.7   Estimated Creatinine Clearance: 129.7 mL/min (by C-G formula based on SCr of 0.83 mg/dL).    Past Medical History:  Diagnosis Date  . Anemia 11/26/2011  . Anxiety   . Asthma   . Celiac and mesenteric artery injury   . Cushing's syndrome (HCC)    06/21/16- "in remission"  . Depression   . H/O hiatal hernia   . History of kidney stones   . POTS (postural orthostatic tachycardia syndrome)   . Shortness of breath dyspnea    with exertertion  . Sphincter of  Oddi dysfunction   . Von Willebrand disease (HCC)    bruising easy     Medications Prior to Admission  Medication Sig Dispense Refill Last Dose  . AMBIEN CR 12.5 MG CR tablet Take 12.5 mg by mouth at bedtime.  1 07/14/2018 at Unknown time  . clonazePAM (KLONOPIN) 1 MG tablet Take 1 tablet (1 mg total) by mouth 2 (two) times daily as needed for anxiety. (Patient taking differently: Take 1 mg by mouth daily as needed for anxiety. ) 30 tablet 0 07/15/2018 at 0450  . diltiazem (CARDIZEM CD) 180 MG 24 hr capsule Take 180 mg by mouth daily.   10 07/15/2018 at 0450  . diphenhydrAMINE (BENADRYL) 25 mg capsule Take 1 capsule (25 mg total) by mouth every 6 (six) hours as needed. (Patient taking differently: Take 50 mg by mouth every 6 (six) hours as needed for itching or allergies. ) 30 capsule 0 Past Week at Unknown time  . DULoxetine (CYMBALTA) 30 MG capsule Take 30 mg by mouth daily.   07/15/2018 at 0450  . famotidine (PEPCID) 20 MG tablet Take 20 mg by mouth daily as needed for heartburn or indigestion.   07/14/2018 at Unknown time  . hydrOXYzine (ATARAX/VISTARIL) 10 MG tablet Take  10 mg by mouth at bedtime.   2 07/14/2018 at Unknown time  . LATUDA 60 MG TABS Take 60 mg by mouth daily with supper.   1 07/14/2018 at Unknown time  . metoprolol succinate (TOPROL-XL) 50 MG 24 hr tablet Take 50-75 mg by mouth See admin instructions. Take 75 mg by mouth in the morning, take 25 mg by mouth at lunch and take 75mg  by mouth in the evening, per patient  10 07/15/2018 at 0450  . montelukast (SINGULAIR) 10 MG tablet Take 10 mg by mouth at bedtime.   07/14/2018 at Unknown time  . omeprazole (PRILOSEC) 20 MG capsule Take 1 capsule (20 mg total) by mouth daily. 14 capsule 0 Past Week at Unknown time  . ondansetron (ZOFRAN-ODT) 4 MG disintegrating tablet Take 1 tablet (4 mg total) by mouth every 6 (six) hours as needed for nausea. 15 tablet 0 07/14/2018 at Unknown time  . traZODone (DESYREL) 100 MG tablet Take 300 mg by mouth at  bedtime.    07/14/2018 at Unknown time  . albuterol (PROAIR HFA) 108 (90 Base) MCG/ACT inhaler Inhale 1-2 puffs into the lungs every 6 (six) hours as needed for wheezing or shortness of breath.    More than a month at Unknown time  . levalbuterol (XOPENEX) 0.31 MG/3ML nebulizer solution Take 1 ampule by nebulization every 4 (four) hours as needed for wheezing.   More than a month at Unknown time  . oxyCODONE-acetaminophen (PERCOCET/ROXICET) 5-325 MG tablet Take 1 tablet by mouth every 6 (six) hours as needed for moderate pain. (Patient not taking: Reported on 07/09/2017) 20 tablet 0 Completed Course at Unknown time       Berkley Harvey 07/15/2018,12:05 PM

## 2018-07-15 NOTE — Anesthesia Procedure Notes (Signed)
Procedure Name: Intubation Date/Time: 07/15/2018 7:50 AM Performed by: Lavina Hamman, CRNA Pre-anesthesia Checklist: Patient identified, Emergency Drugs available, Suction available, Patient being monitored and Timeout performed Patient Re-evaluated:Patient Re-evaluated prior to induction Oxygen Delivery Method: Circle system utilized Preoxygenation: Pre-oxygenation with 100% oxygen Induction Type: IV induction Ventilation: Mask ventilation without difficulty Laryngoscope Size: Mac and 4 Grade View: Grade I Tube type: Oral Tube size: 7.0 mm Number of attempts: 1 Airway Equipment and Method: Stylet Placement Confirmation: ETT inserted through vocal cords under direct vision,  positive ETCO2,  CO2 detector and breath sounds checked- equal and bilateral Secured at: 22 cm Tube secured with: Tape Dental Injury: Teeth and Oropharynx as per pre-operative assessment

## 2018-07-15 NOTE — Transfer of Care (Signed)
Immediate Anesthesia Transfer of Care Note  Patient: [name] Sadowsky  Procedure(s) Performed: Procedure(s): LAPAROSCOPIC GASTRIC SLEEVE RESECTION, UPPER ENDO, ERAS Pathway (N/A)  Patient Location: PACU  Anesthesia Type:General  Level of Consciousness:  sedated, patient cooperative and responds to stimulation  Airway & Oxygen Therapy:Patient Spontanous Breathing and Patient connected to face mask oxgen  Post-op Assessment:  Report given to PACU RN and Post -op Vital signs reviewed and stable  Post vital signs:  Reviewed and stable  Last Vitals:  Vitals:   07/15/18 0550 07/15/18 0954  BP: 112/77 118/73  Pulse: 79 78  Resp: 18 15  Temp: 36.4 C 36.9 C  SpO2: 98% 100%    Complications: No apparent anesthesia complications

## 2018-07-15 NOTE — Anesthesia Preprocedure Evaluation (Addendum)
Anesthesia Evaluation  Patient identified by MRN, date of birth, ID band Patient awake    Reviewed: Allergy & Precautions, NPO status , Patient's Chart, lab work & pertinent test results  Airway Mallampati: I  TM Distance: >3 FB Neck ROM: Full    Dental   Pulmonary    Pulmonary exam normal        Cardiovascular Normal cardiovascular exam  POTS syndrome   Neuro/Psych Anxiety Depression    GI/Hepatic   Endo/Other    Renal/GU      Musculoskeletal   Abdominal   Peds  Hematology   Anesthesia Other Findings   Reproductive/Obstetrics                            Anesthesia Physical Anesthesia Plan  ASA: III  Anesthesia Plan: General   Post-op Pain Management:    Induction: Intravenous  PONV Risk Score and Plan: 3 and Midazolam, Ondansetron and Treatment may vary due to age or medical condition  Airway Management Planned: Oral ETT  Additional Equipment:   Intra-op Plan:   Post-operative Plan: Extubation in OR  Informed Consent: I have reviewed the patients History and Physical, chart, labs and discussed the procedure including the risks, benefits and alternatives for the proposed anesthesia with the patient or authorized representative who has indicated his/her understanding and acceptance.     Plan Discussed with: CRNA and Surgeon  Anesthesia Plan Comments:         Anesthesia Quick Evaluation

## 2018-07-15 NOTE — Op Note (Signed)
Name:  Kaitlyn Johnson MRN: [id number] Date of Surgery: 07/15/2018  Preop Diagnosis:  Morbid Obesity  Postop Diagnosis:  Morbid Obesity (Weight - 262, BMI - 40), S/P Gastric Sleeve resection  Procedure:  Upper endoscopy  (Intraoperative)  Surgeon:  Alphonsa Overall, M.D.  Anesthesia:  GET  Indications for procedure: [name] is a 37 y.o. female whose primary care physician is Scifres, Earlie Server, PA-C and has completed a gastric sleeve resection today for weight loss by Dr. Hassell Done.  I am doing an intraoperative upper endoscopy to evaluate the gastric pouch after the sleeve gastrectomy.  Operative Note: The patient is under general anesthesia.  Dr. Hassell Done is laparoscoping the patient while I do an upper endoscopy to evaluate the stomach pouch.  With the patient intubated, I passed the Olympus upper endoscope without difficulty down the esophagus.  The esophagus was unremarkable.  The esophago-gastric junction was at 40 cm.    The mucosa of the stomach looked viable and the staple line was intact without bleeding.  I advanced the scope to the pylorus, but did not go through it.  While I insufflated the stomach pouch with air, Dr. Hassell Done  flooded the upper abdomen with saline to put the gastric pouch under saline.  There was no bubbling or evidence of a leak.  There was no evidence of narrowing of the pouch and the gastric sleeve looked tubular.  The scope was then withdrawn.  The esophagus was unremarkable and the patient tolerated the endoscopy without difficulty.  Alphonsa Overall, MD, [facility] Pager: [phone] Office phone:  [phone]

## 2018-07-15 NOTE — Op Note (Signed)
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  The abdomen was prepped with Technicare and draped sterilely.  A timeout was performed.  Access to the abdomen was achieved with a 5 mm through the left upper quadrant.  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  GI placed to the left of midline camera port lysis of adhesions which were all fatty in nature.  The Nathanson retractor was inserted and standard placement of trocars including the 15 to the right of the midline.  The pylorus was identified.  Following insufflation, the state of the abdomen was found to be after lysis of the adhesions to the anterior abdominal wall was free.  Because of her history of reflux and her negative upper GI we did a balloon test.  This was borderline positive.  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  The esophagus was side.  We identified the right and left crura but is single stitch to make that more snug.  At the end of the case we endoscope.  The Z line was much farther down in the abdomen perhaps could account for the fact that she refluxes because she does not have usual contribution from the diaphragm to the sphincter back and making her a bit less in terms of her E-G anatomy..  The ViSiGi 36Fr tube was inserted to deflate the stomach and was pulled back into the esophagus.    The pylorus was identified and we measured 5 cm back and marked the antrum.  At that point we began  dissection to take down the greater curvature of the stomach using the Harmonic scalpel.  This dissection was taken all the way up to the left crus.  Posterior attachments of the stomach were also taken down.    The ViSiGi tube was then passed into the antrum and suction applied so that it was snug along the lessor curvature.  The "crow's foot" or incisura was identified.  The sleeve gastrectomy was begun using the [employer] stapler beginning with a four-point 5 black load followed by a 6 cm black load and then subsequent purple loads all with TRS.Marland Kitchen  When the sleeve was complete the tube was taken off suction and insufflated briefly.  The tube was withdrawn.  Upper endoscopy was then performed by Dr. Lucia Gaskins which demonstrated the distance between the diaphragm and the Z line with the esophagus in the abdomen.  Pictures showing the aortic anatomy are inserted into this operative with the diaphragmatic closure demonstrated on the third picture.           The specimen was extracted through the 15 trocar site.  Local was provided by infiltrating with Exparel and closed 4-0 Monocryl and Dermabond.    Matt B. Hassell Done, [place], [facility], [place]

## 2018-07-15 NOTE — Progress Notes (Addendum)
Dr. Daphine DeutscherMartin notified that pharmacy does not have nasal desmopressin. Pt has had infusions in the past via IV.  Dr. Daphine DeutscherMartin would like to collaborate with anesthesia to see what kind of therapy is needed prior to surgery regarding DDAVP.  Logan, CRNA notified and will be speaking with DR. Ossey regarding this.   Dr. Daphine DeutscherMartin confirmed that heparin SQ was okay for pt to have prior to surgery.

## 2018-07-15 NOTE — Anesthesia Postprocedure Evaluation (Signed)
Anesthesia Post Note  Patient: Kaitlyn Johnson  Procedure(s) Performed: LAPAROSCOPIC GASTRIC SLEEVE RESECTION, UPPER ENDO, ERAS Pathway (N/A Abdomen)     Patient location during evaluation: PACU Anesthesia Type: General Level of consciousness: awake and alert Pain management: pain level controlled Vital Signs Assessment: post-procedure vital signs reviewed and stable Respiratory status: spontaneous breathing, nonlabored ventilation, respiratory function stable and patient connected to nasal cannula oxygen Cardiovascular status: blood pressure returned to baseline and stable Postop Assessment: no apparent nausea or vomiting Anesthetic complications: no    Last Vitals:  Vitals:   07/15/18 1115 07/15/18 1136  BP: 125/85 112/73  Pulse: 79 72  Resp: 20 18  Temp: 36.6 C 36.7 C  SpO2: 96% 99%    Last Pain:  Vitals:   07/15/18 1136  TempSrc:   PainSc: 7                  [name] DAVID

## 2018-07-15 NOTE — H&P (Signed)
Chief Complaint:  Morbid obesity and Cushings syndrome  History of Present Illness:  Kaitlyn Johnson is an 37 y.o. female who has acquired Cushings and with her BMI ~40 is difficult to manage.  We have been discussing sleeve gastrectomy for a while and she wants to proceed.  Her BMI is over 40  Past Medical History:  Diagnosis Date  . Anemia 11/26/2011  . Anxiety   . Asthma   . Celiac and mesenteric artery injury   . Cushing's syndrome ([place])    06/21/16- "in remission"  . Depression   . H/O hiatal hernia   . History of kidney stones   . POTS (postural orthostatic tachycardia syndrome)   . Shortness of breath dyspnea    with exertertion  . Sphincter of Oddi dysfunction   . Von Willebrand disease ([place])    bruising easy    Past Surgical History:  Procedure Laterality Date  . ABDOMINAL HYSTERECTOMY  08/2010  . ANTERIOR CERVICAL DECOMP/DISCECTOMY FUSION N/A 03/31/2013   Procedure: ANTERIOR CERVICAL DECOMPRESSION/DISCECTOMY FUSION 1 LEVEL Cervical five-six;  Surgeon: Faythe Ghee, MD;  Location: [facility];  Service: Neurosurgery;  Laterality: N/A;  . APPENDECTOMY    . Cholangio-Pancreatography with Spintectorotomy + Stent  07/16/2012   01/15/14  . CHOLECYSTECTOMY    . LAPAROSCOPIC APPENDECTOMY N/A 02/28/2017   Procedure: APPENDECTOMY LAPAROSCOPIC;  Surgeon: Erroll Luna, MD;  Location: [facility];  Service: General;  Laterality: N/A;  . LAPAROSCOPIC ENDOMETRIOSIS FULGURATION    . LUMBAR LAMINECTOMY    . LUMBAR LAMINECTOMY/DECOMPRESSION MICRODISCECTOMY Left 06/22/2016   Procedure: MICRODISCECTOMY LEFT LUMBAR FOUR-FIVE;  Surgeon: Consuella Lose, MD;  Location: [facility];  Service: Neurosurgery;  Laterality: Left;  . ROUX-EN-Y PROCEDURE  04/1999    No current facility-administered medications for this encounter.    Current Outpatient Medications  Medication Sig Dispense Refill  . albuterol (PROAIR HFA) 108 (90 Base) MCG/ACT inhaler Inhale 1-2 puffs into the lungs every 6  (six) hours as needed for wheezing or shortness of breath.     Lorrin Mais CR 12.5 MG CR tablet Take 12.5 mg by mouth at bedtime.  1  . clonazePAM (KLONOPIN) 1 MG tablet Take 1 tablet (1 mg total) by mouth 2 (two) times daily as needed for anxiety. (Patient taking differently: Take 1 mg by mouth daily as needed for anxiety. ) 30 tablet 0  . diltiazem (CARDIZEM CD) 180 MG 24 hr capsule Take 180 mg by mouth daily.   10  . diphenhydrAMINE (BENADRYL) 25 mg capsule Take 1 capsule (25 mg total) by mouth every 6 (six) hours as needed. (Patient taking differently: Take 50 mg by mouth every 6 (six) hours as needed for itching or allergies. ) 30 capsule 0  . DULoxetine (CYMBALTA) 30 MG capsule Take 30 mg by mouth daily.    . famotidine (PEPCID) 20 MG tablet Take 20 mg by mouth daily as needed for heartburn or indigestion.    . hydrOXYzine (ATARAX/VISTARIL) 10 MG tablet Take 10 mg by mouth at bedtime.   2  . LATUDA 60 MG TABS Take 60 mg by mouth daily with supper.   1  . levalbuterol (XOPENEX) 0.31 MG/3ML nebulizer solution Take 1 ampule by nebulization every 4 (four) hours as needed for wheezing.    . metoprolol succinate (TOPROL-XL) 50 MG 24 hr tablet Take 50-75 mg by mouth See admin instructions. Take 75 mg by mouth in the morning, take 25 mg by mouth at lunch and take 58m by mouth  in the evening, per patient  10  . montelukast (SINGULAIR) 10 MG tablet Take 10 mg by mouth at bedtime.    . traZODone (DESYREL) 100 MG tablet Take 300 mg by mouth at bedtime.     Marland Kitchen omeprazole (PRILOSEC) 20 MG capsule Take 1 capsule (20 mg total) by mouth daily. (Patient not taking: Reported on 07/09/2017) 14 capsule 0  . ondansetron (ZOFRAN-ODT) 4 MG disintegrating tablet Take 1 tablet (4 mg total) by mouth every 6 (six) hours as needed for nausea. (Patient not taking: Reported on 07/09/2017) 15 tablet 0  . oxyCODONE-acetaminophen (PERCOCET/ROXICET) 5-325 MG tablet Take 1 tablet by mouth every 6 (six) hours as needed for moderate  pain. (Patient not taking: Reported on 07/09/2017) 20 tablet 0   Bee venom; Reglan [metoclopramide]; Shellfish allergy; Wheat bran; Adhesive [tape]; Gluten meal; Sulfa antibiotics; Tartrazine; and Yellow dye Family History  Problem Relation Age of Onset  . Hypertension Mother    Social History:   reports that she has never smoked. She has never used smokeless tobacco. She reports that she drinks about 5.0 standard drinks of alcohol per week. She reports that she does not use drugs.   REVIEW OF SYSTEMS : Negative except for see extensive problem list  Physical Exam:   There were no vitals taken for this visit. There is no height or weight on file to calculate BMI.  Gen:  WDWN WF NAD  Neurological: Alert and oriented to person, place, and time. Motor and sensory function is grossly intact  Head: Normocephalic and atraumatic.  Eyes: Conjunctivae are normal. Pupils are equal, round, and reactive to light. No scleral icterus.  Neck: Normal range of motion. Neck supple. No tracheal deviation or thyromegaly present.  Cardiovascular:  SR without murmurs or gallops.  No carotid bruits Breast:  Not examined Respiratory: Effort normal.  No respiratory distress. No chest wall tenderness. Breath sounds normal.  No wheezes, rales or rhonchi.  Abdomen:  Multiple scars GU:  Not examined Musculoskeletal: Normal range of motion. Extremities are nontender. No cyanosis, edema or clubbing noted Lymphadenopathy: No cervical, preauricular, postauricular or axillary adenopathy is present Skin: Skin is warm and dry. No rash noted. No diaphoresis. No erythema. No pallor. Pscyh: Normal mood and affect. Behavior is normal. Judgment and thought content normal.   LABORATORY RESULTS: No results found for this or any previous visit (from the past 48 hour(s)).   RADIOLOGY RESULTS: No results found.  Problem List: Patient Active Problem List   Diagnosis Date Noted  . Acute appendicitis 02/28/2017  . HNP  (herniated nucleus pulposus), lumbar 06/22/2016  . Nausea with vomiting 11/30/2013  . Abdominal pain, epigastric 11/30/2013  . Dehydration 11/30/2013  . Acute pancreatitis 11/30/2013  . Pancreatitis 11/29/2013  . Gastroenteritis due to norovirus 11/26/2013  . Hypomagnesemia 11/25/2013  . Nausea vomiting and diarrhea 11/23/2013  . Anemia of chronic disease 11/23/2013  . Hypokalemia 11/23/2013  . Hyponatremia 11/23/2013  . Nausea and vomiting 11/23/2013  . Iatrogenic Cushing's syndrome ([place]) 10/02/2013  . Endometriosis 09/01/2013  . Celiac disease 09/01/2013  . Asthma, chronic 09/01/2013  . 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 POTS (postural orthostatic tachycardia syndrome) 11/26/2011    Assessment & Plan: Morbid obesity complicating her Cushing's.  For sleeve gastrectomy.    Matt B. Hassell Done, MD, [facility] [phone] beeper [phone]  07/15/2018 12:19 AM

## 2018-07-16 ENCOUNTER — Other Ambulatory Visit: Payer: Self-pay

## 2018-07-16 ENCOUNTER — Encounter (HOSPITAL_COMMUNITY): Payer: Self-pay | Admitting: Surgery

## 2018-07-16 LAB — CBC WITH DIFFERENTIAL/PLATELET
BASOS ABS: 0 10*3/uL (ref 0.0–0.1)
BASOS PCT: 0 %
EOS ABS: 0 10*3/uL (ref 0.0–0.7)
Eosinophils Relative: 0 %
HCT: 35.1 % — ABNORMAL LOW (ref 36.0–46.0)
HEMOGLOBIN: 11.6 g/dL — AB (ref 12.0–15.0)
Lymphocytes Relative: 12 %
Lymphs Abs: 1.6 10*3/uL (ref 0.7–4.0)
MCH: 31.4 pg (ref 26.0–34.0)
MCHC: 33 g/dL (ref 30.0–36.0)
MCV: 95.1 fL (ref 78.0–100.0)
MONOS PCT: 7 %
Monocytes Absolute: 0.9 10*3/uL (ref 0.1–1.0)
NEUTROS PCT: 81 %
Neutro Abs: 10.4 10*3/uL — ABNORMAL HIGH (ref 1.7–7.7)
PLATELETS: 234 10*3/uL (ref 150–400)
RBC: 3.69 MIL/uL — ABNORMAL LOW (ref 3.87–5.11)
RDW: 12.8 % (ref 11.5–15.5)
WBC: 12.9 10*3/uL — ABNORMAL HIGH (ref 4.0–10.5)

## 2018-07-16 LAB — LIPASE, BLOOD: Lipase: 32 U/L (ref 11–51)

## 2018-07-16 MED ORDER — INFLUENZA VAC SPLIT QUAD 0.5 ML IM SUSY
0.5000 mL | PREFILLED_SYRINGE | INTRAMUSCULAR | Status: AC
  Administered 2018-07-17: 0.5 mL via INTRAMUSCULAR
  Filled 2018-07-16: qty 0.5

## 2018-07-16 NOTE — Plan of Care (Signed)
Nutrition Education Note  Received consult for diet education per DROP protocol. Patient states she has had her vitamins ready to use since April. Vitamins were approved by RD at [facility].  Discussed 2 week post op diet with pt. Emphasized that liquids must be non carbonated, non caffeinated, and sugar free. Fluid goals discussed. Pt to follow up with outpatient bariatric RD for further diet progression after 2 weeks. Multivitamins and minerals also reviewed. Teach back method used, pt expressed understanding, expect good compliance.   Diet: First 2 Weeks  You will see the dietitian about two (2) weeks after your surgery. The dietitian will increase the types of foods you can eat if you are handling liquids well:  If you have severe vomiting or nausea and cannot handle clear liquids lasting longer than 1 day, call your surgeon  Protein Shake  Drink at least 2 ounces of shake 5-6 times per day  Each serving of protein shakes (usually 8 - 12 ounces) should have a minimum of:  15 grams of protein  And no more than 5 grams of carbohydrate  Goal for protein each day:  Men = 80 grams per day  Women = 60 grams per day  Protein powder may be added to fluids such as non-fat milk or Lactaid milk or Soy milk (limit to 35 grams added protein powder per serving)   Hydration  Slowly increase the amount of water and other clear liquids as tolerated (See Acceptable Fluids)  Slowly increase the amount of protein shake as tolerated  Sip fluids slowly and throughout the day  May use sugar substitutes in small amounts (no more than 6 - 8 packets per day; i.e. Splenda)   Fluid Goal  The first goal is to drink at least 8 ounces of protein shake/drink per day (or as directed by the nutritionist); some examples of protein shakes are Premier Protein, ITT IndustriesSyntrax Nectar, Dillard'sdkins Advantage, EAS Edge HP, and Unjury. See handout from pre-op Bariatric Education Class:  Slowly increase the amount of protein shake you drink as  tolerated  You may find it easier to slowly sip shakes throughout the day  It is important to get your proteins in first  Your fluid goal is to drink 64 - 100 ounces of fluid daily  It may take a few weeks to build up to this  32 oz (or more) should be clear liquids  And  32 oz (or more) should be full liquids (see below for examples)  Liquids should not contain sugar, caffeine, or carbonation   Clear Liquids:  Water or Sugar-free flavored water (i.e. Fruit H2O, Propel)  Decaffeinated coffee or tea (sugar-free)  Crystal Lite, Wyler's Lite, Minute Maid Lite  Sugar-free Jell-O  Bouillon or broth  Sugar-free Popsicle: *Less than 20 calories each; Limit 1 per day   Full Liquids:  Protein Shakes/Drinks + 2 choices per day of other full liquids  Full liquids must be:  No More Than 12 grams of Carbs per serving  No More Than 3 grams of Fat per serving  Strained low-fat cream soup  Non-Fat milk  Fat-free Lactaid Milk  Sugar-free yogurt (Dannon Lite & Fit, AustriaGreek yogurt, Oikos Zero)   Tilda FrancoLindsey Zander Ingham, MS, RD, LDN [facility] Clinical Dietitian Pager: [phone] After Hours Pager: [phone]

## 2018-07-16 NOTE — Progress Notes (Signed)
Patient alert and oriented, Post op day 1.  Provided support and encouragement.  Encouraged pulmonary toilet, ambulation and small sips of liquids.  Working on finishing 12 ounces of clear fluids.  Reports pain in upper abdomen that travels to back.  Ordered Lipase, resulted at 32.  Dr Daphine DeutscherMartin made aware.  Pain management discussed with patient and bedside RN.  All questions answered.  Will continue to monitor.

## 2018-07-16 NOTE — Progress Notes (Signed)
Patient alert and oriented, pain is controlled. Patient is tolerating fluids. Began protein shakes. Reviewed Gastric sleeve discharge instructions with patient and patient is able to articulate understanding.  Provided information on BELT program, Support Group and [facility] outpatient pharmacy. All questions answered, will continue to monitor.

## 2018-07-16 NOTE — Progress Notes (Signed)
Patient ID: Kaitlyn Johnson, female   DOB: [birth year], 37 y.o.   MRN: [id number] Feeling like she does when she has pancreatitis.  Lipase ordered.  Hassell Done

## 2018-07-17 LAB — CBC WITH DIFFERENTIAL/PLATELET
BASOS ABS: 0 10*3/uL (ref 0.0–0.1)
BASOS PCT: 0 %
Eosinophils Absolute: 0.1 10*3/uL (ref 0.0–0.7)
Eosinophils Relative: 1 %
HEMATOCRIT: 36 % (ref 36.0–46.0)
HEMOGLOBIN: 11.6 g/dL — AB (ref 12.0–15.0)
LYMPHS PCT: 24 %
Lymphs Abs: 2.5 10*3/uL (ref 0.7–4.0)
MCH: 31.2 pg (ref 26.0–34.0)
MCHC: 32.2 g/dL (ref 30.0–36.0)
MCV: 96.8 fL (ref 78.0–100.0)
Monocytes Absolute: 0.9 10*3/uL (ref 0.1–1.0)
Monocytes Relative: 9 %
NEUTROS ABS: 7.1 10*3/uL (ref 1.7–7.7)
NEUTROS PCT: 66 %
Platelets: 224 10*3/uL (ref 150–400)
RBC: 3.72 MIL/uL — AB (ref 3.87–5.11)
RDW: 13.4 % (ref 11.5–15.5)
WBC: 10.6 10*3/uL — AB (ref 4.0–10.5)

## 2018-07-17 MED ORDER — OXYCODONE HCL 5 MG/5ML PO SOLN: 5.0000 mg | Freq: Four times a day (QID) | ORAL | 0 refills | Status: DC | PRN

## 2018-07-17 MED ORDER — OXYCODONE HCL 5 MG/5ML PO SOLN: 5.0000 mg | ORAL | 0 refills | Status: DC | PRN

## 2018-07-17 MED FILL — oxyCODONE HCL 5 MG/5ML SOLN: 5 | 5 days supply | Qty: 100 | Fill #0

## 2018-07-17 NOTE — Progress Notes (Signed)
Patient drinking well overnight.  Completed protein shake and 8 ounce cup of water.  No nausea pain controlled.  Husband at bedside.  Reviewed discharge information.  Total fluid intake 660 Per dehydration protocol call back one week post op

## 2018-07-17 NOTE — Care Management Note (Signed)
Case Management Note  Patient Details  Name: [name] Arcilla MRN: [id number] Date of Birth: 09-04-1981  Subjective/Objective: sp lap sleeve gastrectomy. No CM needs.                   Action/Plan:dc home.   Expected Discharge Date:  07/17/18               Expected Discharge Plan:  Home/Self Care  In-House Referral:     Discharge planning Services  CM Consult  Post Acute Care Choice:    Choice offered to:     DME Arranged:    DME Agency:     HH Arranged:    HH Agency:     Status of Service:  Completed, signed off  If discussed at [employer] Length of Stay Meetings, dates discussed:    Additional Comments:  Lanier ClamMahabir, Kadon Andrus, RN 07/17/2018, 10:41 AM

## 2018-07-17 NOTE — Progress Notes (Signed)
Pt alert, oriented, tolerating liquids.  D/C instructions given, all questions answered.  Pt d/cd home with spouse.

## 2018-07-17 NOTE — Discharge Summary (Signed)
Physician Discharge Summary  Patient ID: Kaitlyn Johnson MRN: [id number] DOB/AGE: 35-29-1982 35 y.o.  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  Begun on liquids and these were advanced until she was ready for discharge on PD 2  Consults: none  Significant Diagnostic Studies: none    Discharge Exam: Blood pressure 134/80, pulse 76, temperature 98.1 F (36.7 C), temperature source Oral, resp. rate 18, height 5' 8"  (1.727 m), weight 119.2 kg, SpO2 93 %. Incisions OK  Disposition: Discharge disposition: 01-Home or Self Care       Discharge Instructions    Ambulate hourly while awake   Complete by:  As directed    Call MD for:  difficulty breathing, headache or visual disturbances   Complete by:  As directed    Call MD for:  persistant dizziness or light-headedness   Complete by:  As directed    Call MD for:  persistant nausea and vomiting   Complete by:  As directed    Call MD for:  redness, tenderness, or signs of infection (pain, swelling, redness, odor or green/yellow discharge around incision site)   Complete by:  As directed    Call MD for:  severe uncontrolled pain   Complete by:  As directed    Call MD for:  temperature >101 F   Complete by:  As directed    Diet bariatric full liquid   Complete by:  As directed    Incentive spirometry   Complete by:  As directed    Perform hourly while awake     Allergies as of 07/17/2018      Reactions   Bee Venom Anaphylaxis   Reglan [metoclopramide] Other (See Comments)   Reaction:  Oculogyric crisis    Shellfish Allergy Anaphylaxis   Wheat Bran Other (See Comments)   Pt has celiac disease.     Adhesive [tape] Hives   Gluten Meal Other (See Comments)   Pt has celiac disease.    Sulfa Antibiotics Rash   Tartrazine Rash   Yellow Dye Rash      Medication List    STOP taking these medications   oxyCODONE-acetaminophen 5-325 MG tablet Commonly known as:  PERCOCET/ROXICET     TAKE these medications   AMBIEN CR 12.5 MG CR tablet Generic drug:  zolpidem Take 12.5 mg by mouth at bedtime.   clonazePAM 1 MG tablet Commonly known as:  KLONOPIN Take 1 tablet (1 mg total) by mouth 2 (two) times daily as needed for anxiety. What changed:  when to take this   diltiazem 180 MG 24 hr capsule Commonly known as:  CARDIZEM CD Take 180 mg by mouth daily. Notes to patient:  Monitor Blood Pressure Daily and keep a log for primary care physician.  Monitor for symptoms of dehydration.  You may need to make changes to your medications with rapid weight loss.     diphenhydrAMINE 25 mg capsule Commonly known as:  BENADRYL Take 1 capsule (25 mg total) by mouth every 6 (six) hours as needed. What changed:    how much to take  reasons to take this   DULoxetine 30  MG capsule Commonly known as:  CYMBALTA Take 30 mg by mouth daily.   famotidine 20 MG tablet Commonly known as:  PEPCID Take 20 mg by mouth daily as needed for heartburn or indigestion.   hydrOXYzine 10 MG tablet Commonly known as:  ATARAX/VISTARIL Take 10 mg by mouth at bedtime.   LATUDA 60 MG Tabs Generic drug:  Lurasidone HCl Take 60 mg by mouth daily with supper.   levalbuterol 0.31 MG/3ML nebulizer solution Commonly known as:  XOPENEX Take 1 ampule by nebulization every 4 (four) hours as needed for wheezing.   metoprolol succinate 50 MG 24 hr tablet Commonly known as:  TOPROL-XL Take 50-75 mg by mouth See admin instructions. Take 75 mg by mouth in the morning, take 25 mg by mouth at lunch and take 51m by mouth in the  evening, per patient Notes to patient:  Monitor Blood Pressure Daily and keep a log for primary care physician.  You may need to make changes to your medications with rapid weight loss.     montelukast 10 MG tablet Commonly known as:  SINGULAIR Take 10 mg by mouth at bedtime.   omeprazole 20 MG capsule Commonly known as:  PRILOSEC Take 1 capsule (20 mg total) by mouth daily.   ondansetron 4 MG disintegrating tablet Commonly known as:  ZOFRAN-ODT Take 1 tablet (4 mg total) by mouth every 6 (six) hours as needed for nausea.   oxyCODONE 5 MG/5ML solution Commonly known as:  ROXICODONE Take 5-10 mLs (5-10 mg total) by mouth every 4 (four) hours as needed for moderate pain or severe pain.   PROAIR HFA 108 (90 Base) MCG/ACT inhaler Generic drug:  albuterol Inhale 1-2 puffs into the lungs every 6 (six) hours as needed for wheezing or shortness of breath.   traZODone 100 MG tablet Commonly known as:  DESYREL Take 300 mg by mouth at bedtime.      Follow-up Information    [facility].   Specialty:  General Surgery Why:  at 9am with Dr MJohnathan HausenContact information: [phone]       GCarlena Hurl PA-C. Go on 09/02/2018.   Specialty:  General Surgery Why:  at [id number]Contact information: [place] [phone]          Signed: MPedro Earls9/19/2019, 8:26 AM

## 2018-07-21 ENCOUNTER — Other Ambulatory Visit: Payer: Self-pay | Admitting: Surgery

## 2018-07-21 ENCOUNTER — Telehealth (HOSPITAL_COMMUNITY): Payer: Self-pay

## 2018-07-21 NOTE — Telephone Encounter (Addendum)
Patient called to discuss post bariatric surgery follow up questions.  See below:   1.  Tell me about your pain and pain management?pain/cramping with drinking afraid to drink due to stretching stomach.  Discussed that is a myth.  2.  Let's talk about fluid intake.  How much total fluid are you taking in?23 ounces of fluid  3.  How much protein have you taken in the last 2 days?15 grams  4.  Have you had nausea?  Tell me about when have experienced nausea and what you did to help?no nausea  5.  Has the frequency or color changed with your urine?limited urine, dark in color  6.  Tell me what your incisions look like?incisions look ok  7.  Have you been passing gas? BM?bm every day 8.  If a problem or question were to arise who would you call?  Do you know contact numbers for [facility], [facility], and [facility]?aware of how to contact all services  9.  How has the walking going?walking every hour  10.  How are your vitamins and calcium going?  How are you taking them?taking vitamin and calcium appropriately

## 2018-07-24 ENCOUNTER — Ambulatory Visit (HOSPITAL_COMMUNITY)
Admission: RE | Admit: 2018-07-24 | Discharge: 2018-07-24 | Disposition: A | Discharge status: Home / Self Care | Payer: BLUE CROSS/BLUE SHIELD | Source: Ambulatory Visit | Attending: Surgery | Admitting: Surgery

## 2018-07-24 DIAGNOSIS — E860 Dehydration: Secondary | ICD-10-CM | POA: Diagnosis present

## 2018-07-24 MED ORDER — SODIUM CHLORIDE 0.9 % IV SOLN: INTRAVENOUS | Status: DC

## 2018-07-24 MED ORDER — SODIUM CHLORIDE 0.9 % IV BOLUS
2000.0000 mL | Freq: Once | INTRAVENOUS | Status: AC
  Administered 2018-07-24: 2000 mL via INTRAVENOUS

## 2018-07-24 NOTE — Discharge Instructions (Signed)
Dehydration, Adult Dehydration is when there is not enough fluid or water in your body. This happens when you lose more fluids than you take in. Dehydration can range from mild to very bad. It should be treated right away to keep it from getting very bad. Symptoms of mild dehydration may include:  Thirst.  Dry lips.  Slightly dry mouth.  Dry, warm skin.  Dizziness. Symptoms of moderate dehydration may include:  Very dry mouth.  Muscle cramps.  Dark pee (urine). Pee may be the color of tea.  Your body making less pee.  Your eyes making fewer tears.  Heartbeat that is uneven or faster than normal (palpitations).  Headache.  Light-headedness, especially when you stand up from sitting.  Fainting (syncope). Symptoms of very bad dehydration may include:  Changes in skin, such as: ? Cold and clammy skin. ? Blotchy (mottled) or pale skin. ? Skin that does not quickly return to normal after being lightly pinched and let go (poor skin turgor).  Changes in body fluids, such as: ? Feeling very thirsty. ? Your eyes making fewer tears. ? Not sweating when body temperature is high, such as in hot weather. ? Your body making very little pee.  Changes in vital signs, such as: ? Weak pulse. ? Pulse that is more than 100 beats a minute when you are sitting still. ? Fast breathing. ? Low blood pressure.  Other changes, such as: ? Sunken eyes. ? Cold hands and feet. ? Confusion. ? Lack of energy (lethargy). ? Trouble waking up from sleep. ? Short-term weight loss. ? Unconsciousness. Follow these instructions at home:  If told by your doctor, drink an ORS: ? Make an ORS by using instructions on the package. ? Start by drinking small amounts, about  cup (120 mL) every 5-10 minutes. ? Slowly drink more until you have had the amount that your doctor said to have.  Drink enough clear fluid to keep your pee clear or pale yellow. If you were told to drink an ORS, finish the ORS  first, then start slowly drinking clear fluids. Drink fluids such as: ? Water. Do not drink only water by itself. Doing that can make the salt (sodium) level in your body get too low (hyponatremia). ? Ice chips. ? Fruit juice that you have added water to (diluted). ? Low-calorie sports drinks.  Avoid: ? Alcohol. ? Drinks that have a lot of sugar. These include high-calorie sports drinks, fruit juice that does not have water added, and soda. ? Caffeine. ? Foods that are greasy or have a lot of fat or sugar.  Take over-the-counter and prescription medicines only as told by your doctor.  Do not take salt tablets. Doing that can make the salt level in your body get too high (hypernatremia).  Eat foods that have minerals (electrolytes). Examples include bananas, oranges, potatoes, tomatoes, and spinach.  Keep all follow-up visits as told by your doctor. This is important. Contact a doctor if:  You have belly (abdominal) pain that: ? Gets worse. ? Stays in one area (localizes).  You have a rash.  You have a stiff neck.  You get angry or annoyed more easily than normal (irritability).  You are more sleepy than normal.  You have a harder time waking up than normal.  You feel: ? Weak. ? Dizzy. ? Very thirsty.  You have peed (urinated) only a small amount of very dark pee during 6-8 hours. Get help right away if:  You have symptoms of   very bad dehydration.  You cannot drink fluids without throwing up (vomiting).  Your symptoms get worse with treatment.  You have a fever.  You have a very bad headache.  You are throwing up or having watery poop (diarrhea) and it: ? Gets worse. ? Does not go away.  You have blood or something green (bile) in your throw-up.  You have blood in your poop (stool). This may cause poop to look black and tarry.  You have not peed in 6-8 hours.  You pass out (faint).  Your heart rate when you are sitting still is more than 100 beats a  minute.  You have trouble breathing. This information is not intended to replace advice given to you by your health care provider. Make sure you discuss any questions you have with your health care provider. Document Released: 08/11/2009 Document Revised: 05/04/2016 Document Reviewed: 12/09/2015 Elsevier Interactive Patient Education  2018 Elsevier Inc.  

## 2018-07-24 NOTE — Progress Notes (Signed)
PATIENT CARE CENTER NOTE  Diagnosis: Dehydration   Provider: Luretha Harte, MD   Procedure: 2 Liter fluid bolus   Note: Patient received 2 Liter bolus of 0.9% Sodium Chloride. Patient tolerated bolus well with no adverse reaction. Vital signs stable. Discharge instructions given. Patient alert, oriented and ambulatory at discharge.

## 2018-07-29 ENCOUNTER — Telehealth: Payer: Self-pay | Admitting: Skilled Nursing Facility1

## 2018-07-29 ENCOUNTER — Other Ambulatory Visit: Payer: Self-pay

## 2018-07-29 ENCOUNTER — Encounter: Payer: BLUE CROSS/BLUE SHIELD | Attending: Surgery | Admitting: Skilled Nursing Facility1

## 2018-07-29 ENCOUNTER — Encounter (HOSPITAL_COMMUNITY): Payer: Self-pay

## 2018-07-29 ENCOUNTER — Emergency Department (HOSPITAL_COMMUNITY)
Admission: EM | Admit: 2018-07-29 | Discharge: 2018-07-29 | Disposition: A | Discharge status: Home / Self Care | Payer: BLUE CROSS/BLUE SHIELD | Attending: Emergency Medicine | Admitting: Emergency Medicine

## 2018-07-29 DIAGNOSIS — Z79899 Other long term (current) drug therapy: Secondary | ICD-10-CM | POA: Insufficient documentation

## 2018-07-29 DIAGNOSIS — J45909 Unspecified asthma, uncomplicated: Secondary | ICD-10-CM | POA: Diagnosis not present

## 2018-07-29 DIAGNOSIS — R638 Other symptoms and signs concerning food and fluid intake: Secondary | ICD-10-CM | POA: Diagnosis not present

## 2018-07-29 DIAGNOSIS — R1110 Vomiting, unspecified: Secondary | ICD-10-CM | POA: Diagnosis not present

## 2018-07-29 LAB — BASIC METABOLIC PANEL
ANION GAP: 13 (ref 5–15)
BUN: 14 mg/dL (ref 6–20)
CHLORIDE: 104 mmol/L (ref 98–111)
CO2: 22 mmol/L (ref 22–32)
CREATININE: 0.82 mg/dL (ref 0.44–1.00)
Calcium: 9.3 mg/dL (ref 8.9–10.3)
GFR calc non Af Amer: >60 mL/min (ref >60)
Glucose, Bld: 81 mg/dL (ref 70–99)
POTASSIUM: 3.7 mmol/L (ref 3.5–5.1)
SODIUM: 139 mmol/L (ref 135–145)

## 2018-07-29 MED ORDER — SODIUM CHLORIDE 0.9 % IV BOLUS
1000.0000 mL | Freq: Once | INTRAVENOUS | Status: AC
  Administered 2018-07-29: 1000 mL via INTRAVENOUS

## 2018-07-29 NOTE — Telephone Encounter (Signed)
Pt arrived for post-op class today with complaints of dizziness, fatigue, pain every time she swallows, dry mouth and averaging about 15 ounces of fluid a day. Pt states she was having diarrhea but that has subsided for the last 3 days now.   Dietitian asked her colleague Isidore Moos RD to call the rehydration clinic while this dietitian taught the post-op class. [place] states the rehydration triage nurse was not able to see the pt due to their not being enough time to work with the pt before their closing hours so the pt would need to go to the ED. This was relayed to the pt and the pt did go to the ED for rehydration.   This Dietitian will call pt tomorrow for post op education.

## 2018-07-29 NOTE — ED Provider Notes (Signed)
[facility]-EMERGENCY DEPT Provider Note   CSN: [id number] Arrival date & time: 07/29/18  1615     History   Chief Complaint Chief Complaint  Patient presents with  . gastric sleeve issues    HPI Kaitlyn Johnson is a 37 y.o. female.  37 y/o female with a PMH of POTS, Anxiety and Asthma presents to the ED sent in by her nutritionist for lower intake after gastric sleeve surgery. She had a gastric sleeve placed two weeks ago by Dr. Baxter Kail.She reports her intake has been decreasing the past two days.Patient was seen by her nutritionist today and she reported she only had 14 ounces of fluid intake. She was told by the nutritionist " I cant see you today you need to go to the ED". Patient reports she has not been able to have any fluid intake or solid intake. She has tried eating popsicles but had multiple episodes of emesis. She denies any blood in her emesis. Patient is here for rehydration. She denies any fever, chest pain, shortness of breath, pain at the surgical site or abdominal pain.      Past Medical History:  Diagnosis Date  . Anemia 11/26/2011  . Anxiety   . Asthma   . Celiac and mesenteric artery injury   . Cushing's syndrome (HCC)    06/21/16- "in remission"  . Depression   . H/O hiatal hernia   . History of kidney stones   . POTS (postural orthostatic tachycardia syndrome)   . Shortness of breath dyspnea    with exertertion  . Sphincter of Oddi dysfunction   . Von Willebrand disease (HCC)    bruising easy    Patient Active Problem List   Diagnosis Date Noted  . 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 Acute appendicitis 02/28/2017  . HNP (herniated nucleus pulposus), lumbar 06/22/2016  . Nausea with vomiting 11/30/2013  . Abdominal pain, epigastric 11/30/2013  . Dehydration 11/30/2013  . Acute pancreatitis 11/30/2013  . Pancreatitis 11/29/2013  . Gastroenteritis due to norovirus 11/26/2013  .  Hypomagnesemia 11/25/2013  . Nausea vomiting and diarrhea 11/23/2013  . Anemia of chronic disease 11/23/2013  . Hypokalemia 11/23/2013  . Hyponatremia 11/23/2013  . Nausea and vomiting 11/23/2013  . Iatrogenic Cushing's syndrome (HCC) 10/02/2013  . Endometriosis 09/01/2013  . Celiac disease 09/01/2013  . Asthma, chronic 09/01/2013  . 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 POTS (postural orthostatic tachycardia syndrome) 11/26/2011    Past Surgical History:  Procedure Laterality Date  . ABDOMINAL HYSTERECTOMY  08/2010  . ANTERIOR CERVICAL DECOMP/DISCECTOMY FUSION N/A 03/31/2013   Procedure: ANTERIOR CERVICAL DECOMPRESSION/DISCECTOMY FUSION 1 LEVEL Cervical five-six;  Surgeon: Reinaldo Meeker, MD;  Location: [facility];  Service: Neurosurgery;  Laterality: N/A;  . APPENDECTOMY    . Cholangio-Pancreatography with Spintectorotomy + Stent  07/16/2012   01/15/14  . CHOLECYSTECTOMY    . LAPAROSCOPIC APPENDECTOMY N/A 02/28/2017   Procedure: APPENDECTOMY LAPAROSCOPIC;  Surgeon: Harriette Bouillon, MD;  Location: [facility];  Service: General;  Laterality: N/A;  . LAPAROSCOPIC ENDOMETRIOSIS FULGURATION    . LAPAROSCOPIC GASTRIC SLEEVE RESECTION N/A 07/15/2018   Procedure: LAPAROSCOPIC GASTRIC SLEEVE RESECTION, UPPER ENDO, ERAS Pathway;  Surgeon: Luretha Prew, MD;  Location: [facility];  Service: General;  Laterality: N/A;  . LUMBAR LAMINECTOMY    . LUMBAR LAMINECTOMY/DECOMPRESSION MICRODISCECTOMY Left 06/22/2016   Procedure: MICRODISCECTOMY LEFT LUMBAR FOUR-FIVE;  Surgeon: Lisbeth Renshaw, MD;  Location: [facility];  Service: Neurosurgery;  Laterality: Left;  .  ROUX-EN-Y PROCEDURE  04/1999     OB History   None      Home Medications    Prior to Admission medications   Medication Sig Start Date End Date Taking? Authorizing Provider  AMBIEN CR 12.5 MG CR tablet Take 12.5 mg by mouth at bedtime. 10/31/15  Yes [provider]  clonazePAM (KLONOPIN) 1 MG tablet Take 1  tablet (1 mg total) by mouth 2 (two) times daily as needed for anxiety. Patient taking differently: Take 1 mg by mouth daily as needed for anxiety.  12/07/13  Yes Dorothea Ogle, MD  diltiazem (CARDIZEM CD) 180 MG 24 hr capsule Take 180 mg by mouth daily.    Yes [provider]  DULoxetine (CYMBALTA) 30 MG capsule Take 30 mg by mouth daily.   Yes [provider]  famotidine (PEPCID) 20 MG tablet Take 20 mg by mouth daily as needed for heartburn or indigestion.   Yes [provider]  hydrOXYzine (ATARAX/VISTARIL) 10 MG tablet Take 10 mg by mouth at bedtime.    Yes [provider]  LATUDA 60 MG TABS Take 60 mg by mouth daily with supper.    Yes [provider]  metoprolol succinate (TOPROL-XL) 25 MG 24 hr tablet Take 25 mg by mouth. 07/16/18  Yes [provider]  metoprolol succinate (TOPROL-XL) 50 MG 24 hr tablet Take 50-75 mg by mouth See admin instructions. Take 75 mg by mouth in the morning, take 25 mg by mouth at lunch and take 75mg  by mouth in the evening, per patient   Yes [provider]  montelukast (SINGULAIR) 10 MG tablet Take 10 mg by mouth at bedtime.   Yes [provider]  omeprazole (PRILOSEC) 20 MG capsule Take 1 capsule (20 mg total) by mouth daily. 12/31/16  Yes Leaphart, Iantha Fallen T, PA-C  ondansetron (ZOFRAN-ODT) 4 MG disintegrating tablet Take 1 tablet (4 mg total) by mouth every 6 (six) hours as needed for nausea. 03/01/17  Yes Meuth, Brooke A, PA-C  oxyCODONE (ROXICODONE) 5 MG/5ML solution Take 5-10 mLs (5-10 mg total) by mouth every 4 (four) hours as needed for moderate pain or severe pain. 07/17/18  Yes Luretha Grahn, MD  traZODone (DESYREL) 100 MG tablet Take 300 mg by mouth at bedtime.    Yes [provider]  albuterol (PROAIR HFA) 108 (90 Base) MCG/ACT inhaler Inhale 1-2 puffs into the lungs every 6 (six) hours as needed for wheezing or shortness of breath.     [provider]  diphenhydrAMINE  (BENADRYL) 25 mg capsule Take 1 capsule (25 mg total) by mouth every 6 (six) hours as needed. Patient not taking: Reported on 07/29/2018 07/17/17 06/24/18  Graciella Freer A, PA-C  levalbuterol (XOPENEX) 0.31 MG/3ML nebulizer solution Take 1 ampule by nebulization every 4 (four) hours as needed for wheezing.    [provider]  oxyCODONE (ROXICODONE) 5 MG/5ML solution Take 5 mLs (5 mg total) by mouth every 6 (six) hours as needed for severe pain. Patient not taking: Reported on 07/29/2018 07/17/18   Luretha Roll, MD    Family History Family History  Problem Relation Age of Onset  . Hypertension Mother     Social History Social History   Tobacco Use  . Smoking status: Never Smoker  . Smokeless tobacco: Never Used  Substance Use Topics  . Alcohol use: Yes    Alcohol/week: 5.0 standard drinks    Types: 5 Glasses of wine per week  . Drug use: No  Allergies   Bee venom; Reglan [metoclopramide]; Shellfish allergy; Wheat bran; Adhesive [tape]; Gluten meal; Sulfa antibiotics; Tartrazine; and Yellow dye   Review of Systems Review of Systems  Constitutional: Negative for chills and fever.  HENT: Negative for sore throat.   Respiratory: Negative for shortness of breath.   Cardiovascular: Negative for chest pain.  Gastrointestinal: Positive for vomiting. Negative for abdominal pain, diarrhea and nausea.  Genitourinary: Negative for dysuria, flank pain and hematuria.  Musculoskeletal: Negative for back pain.  Neurological: Negative for syncope, light-headedness and headaches.  All other systems reviewed and are negative.    Physical Exam Updated Vital Signs BP 123/86   Pulse 95   Temp 98.5 F (36.9 C) (Oral)   Resp 16   Ht 5\' 8"  (1.727 m)   Wt 112.5 kg   SpO2 98%   BMI 37.71 kg/m   Physical Exam  Constitutional: She is oriented to person, place, and time. She appears well-developed and well-nourished.  HENT:  Head: Normocephalic and atraumatic.  Neck: Normal  range of motion. Neck supple.  Cardiovascular: Tachycardia present.  Pulmonary/Chest: Breath sounds normal.  Abdominal: Soft. Bowel sounds are normal. There is no tenderness.  Musculoskeletal: She exhibits no tenderness.  Neurological: She is alert and oriented to person, place, and time.  Skin: Skin is warm and dry. No erythema.     Multiple surgical sites, nicely healing no erythema, no edema, no drainage from surgical wound.   Nursing note and vitals reviewed.    ED Treatments / Results  Labs (all labs ordered are listed, but only abnormal results are displayed) Labs Reviewed  BASIC METABOLIC PANEL    EKG None  Radiology No results found.  Procedures Procedures (including critical care time)  Medications Ordered in ED Medications  sodium chloride 0.9 % bolus 1,000 mL (1,000 mLs Intravenous New Bag/Given 07/29/18 1838)  sodium chloride 0.9 % bolus 1,000 mL (1,000 mLs Intravenous New Bag/Given 07/29/18 2008)     Initial Impression / Assessment and Plan / ED Course  I have reviewed the triage vital signs and the nursing notes.  Pertinent labs & imaging results that were available during my care of the patient were reviewed by me and considered in my medical decision making (see chart for details).     Spoke to Dr. Dwain Sarna who states patient not call the office and was told by her nutritionist to make an appointment to the infusion center later this week.  Advised patient may receive fluids while she is in the ED however she has follow-up set up with Dr. Luretha Kimble for her gastric sleeve.  I will provide patient with fluids along with check BMP to make sure no electrolyte abnormality is present. Will further discuss plan with patient.   08:00PM to RN who will now infuse second bag of fluids.  Patient is asymptomatic at this time has a follow-up appointment with Dr. Daphine Deutscher tomorrow.  Will discharge patient home after second bag of fluids has gotten.  9:25 PM  Patient is ready for discharged after completion of 2 bags of fluids.  Patient has follow-up appointment with Dr. Daphine Deutscher who was her surgeon tomorrow.  Final Clinical Impressions(s) / ED Diagnoses   Final diagnoses:  Decreased oral intake  Vomiting, intractability of vomiting not specified, presence of nausea not specified, unspecified vomiting type    ED Discharge Orders    None       Claude Manges, Cordelia Poche 07/29/18 2126    Tilden Fossa, MD 07/31/18 [phone]

## 2018-07-29 NOTE — ED Triage Notes (Signed)
patient reports that she had a gastric sleeve 2 weeks ago and has had only 14 ounces of fluids and when she tries to consume more she gets nauseated. Patient's physician wanted the patient to come to the ED for IV fluids.

## 2018-07-29 NOTE — Discharge Instructions (Signed)
We have provided you with two bags of fluids during your ED visit. Your blood work is within normal limits.Please follow up with Dr. Daphine Deutscher tomorrow at your scheduled appointment.

## 2018-07-29 NOTE — Telephone Encounter (Signed)
[facility]  BODY COMP RESULTS  07/29/2018   BMI (kg/m^2) 37.8   Fat Mass (lbs) 133.2   Fat Free Mass (lbs) 115.4   Total Body Water (lbs) 85.4

## 2018-07-29 NOTE — ED Notes (Signed)
Pt is alert and oriented x 4 and is verbally responsive. Pt denies pain at this time and states that when she tries to drink to much or too fast fluid comes up. Pt reports that symptoms have been ongoing x 2 weeks. Pt denies any vomiting in the past 24 hours. Pt reports that she has a f/u appt.  tomorrow.

## 2018-07-31 ENCOUNTER — Telehealth: Payer: Self-pay | Admitting: Skilled Nursing Facility1

## 2018-07-31 NOTE — Telephone Encounter (Signed)
Dietitian called to set up a post-op appt with pt.  LVM

## 2018-08-01 ENCOUNTER — Ambulatory Visit (HOSPITAL_COMMUNITY)
Admission: RE | Admit: 2018-08-01 | Discharge: 2018-08-01 | Disposition: A | Discharge status: Home / Self Care | Payer: BLUE CROSS/BLUE SHIELD | Source: Ambulatory Visit | Attending: Surgery | Admitting: Surgery

## 2018-08-01 DIAGNOSIS — E860 Dehydration: Secondary | ICD-10-CM | POA: Diagnosis not present

## 2018-08-01 MED ORDER — SODIUM CHLORIDE 0.9 % IV SOLN
INTRAVENOUS | Status: AC
  Administered 2018-08-01 (×2): via INTRAVENOUS

## 2018-08-01 NOTE — Progress Notes (Signed)
Diagnosis: Dehydration   Provider: Luretha Shad, MD   Procedure: 2 Liter fluid bolus   Note: Patient received 2 Liter bolus of 0.9% Sodium Chloride. Patient tolerated bolus well with no adverse reaction. Vital signs stable. Discharge instructions given. Patient alert, oriented and ambulatory at discharge.

## 2018-08-01 NOTE — Discharge Instructions (Signed)
Dehydration, Adult Dehydration is when there is not enough fluid or water in your body. This happens when you lose more fluids than you take in. Dehydration can range from mild to very bad. It should be treated right away to keep it from getting very bad. Symptoms of mild dehydration may include:  Thirst.  Dry lips.  Slightly dry mouth.  Dry, warm skin.  Dizziness. Symptoms of moderate dehydration may include:  Very dry mouth.  Muscle cramps.  Dark pee (urine). Pee may be the color of tea.  Your body making less pee.  Your eyes making fewer tears.  Heartbeat that is uneven or faster than normal (palpitations).  Headache.  Light-headedness, especially when you stand up from sitting.  Fainting (syncope). Symptoms of very bad dehydration may include:  Changes in skin, such as: ? Cold and clammy skin. ? Blotchy (mottled) or pale skin. ? Skin that does not quickly return to normal after being lightly pinched and let go (poor skin turgor).  Changes in body fluids, such as: ? Feeling very thirsty. ? Your eyes making fewer tears. ? Not sweating when body temperature is high, such as in hot weather. ? Your body making very little pee.  Changes in vital signs, such as: ? Weak pulse. ? Pulse that is more than 100 beats a minute when you are sitting still. ? Fast breathing. ? Low blood pressure.  Other changes, such as: ? Sunken eyes. ? Cold hands and feet. ? Confusion. ? Lack of energy (lethargy). ? Trouble waking up from sleep. ? Short-term weight loss. ? Unconsciousness. Follow these instructions at home:  If told by your doctor, drink an ORS: ? Make an ORS by using instructions on the package. ? Start by drinking small amounts, about  cup (120 mL) every 5-10 minutes. ? Slowly drink more until you have had the amount that your doctor said to have.  Drink enough clear fluid to keep your pee clear or pale yellow. If you were told to drink an ORS, finish the ORS  first, then start slowly drinking clear fluids. Drink fluids such as: ? Water. Do not drink only water by itself. Doing that can make the salt (sodium) level in your body get too low (hyponatremia). ? Ice chips. ? Fruit juice that you have added water to (diluted). ? Low-calorie sports drinks.  Avoid: ? Alcohol. ? Drinks that have a lot of sugar. These include high-calorie sports drinks, fruit juice that does not have water added, and soda. ? Caffeine. ? Foods that are greasy or have a lot of fat or sugar.  Take over-the-counter and prescription medicines only as told by your doctor.  Do not take salt tablets. Doing that can make the salt level in your body get too high (hypernatremia).  Eat foods that have minerals (electrolytes). Examples include bananas, oranges, potatoes, tomatoes, and spinach.  Keep all follow-up visits as told by your doctor. This is important. Contact a doctor if:  You have belly (abdominal) pain that: ? Gets worse. ? Stays in one area (localizes).  You have a rash.  You have a stiff neck.  You get angry or annoyed more easily than normal (irritability).  You are more sleepy than normal.  You have a harder time waking up than normal.  You feel: ? Weak. ? Dizzy. ? Very thirsty.  You have peed (urinated) only a small amount of very dark pee during 6-8 hours. Get help right away if:  You have symptoms of   very bad dehydration.  You cannot drink fluids without throwing up (vomiting).  Your symptoms get worse with treatment.  You have a fever.  You have a very bad headache.  You are throwing up or having watery poop (diarrhea) and it: ? Gets worse. ? Does not go away.  You have blood or something green (bile) in your throw-up.  You have blood in your poop (stool). This may cause poop to look black and tarry.  You have not peed in 6-8 hours.  You pass out (faint).  Your heart rate when you are sitting still is more than 100 beats a  minute.  You have trouble breathing. This information is not intended to replace advice given to you by your health care provider. Make sure you discuss any questions you have with your health care provider. Document Released: 08/11/2009 Document Revised: 05/04/2016 Document Reviewed: 12/09/2015 Elsevier Interactive Patient Education  2018 Elsevier Inc.  

## 2018-08-05 ENCOUNTER — Encounter: Payer: Self-pay | Admitting: Skilled Nursing Facility1

## 2018-08-05 ENCOUNTER — Encounter: Payer: BLUE CROSS/BLUE SHIELD | Admitting: Skilled Nursing Facility1

## 2018-08-05 DIAGNOSIS — E669 Obesity, unspecified: Secondary | ICD-10-CM | POA: Insufficient documentation

## 2018-08-05 NOTE — Progress Notes (Signed)
Bariatric Class:  Appt start time: 1530 end time:  1630.  2 Week Post-Operative Nutrition Class  Patient was seen on 08/05/2018 for Post-Operative Nutrition education at [facility].    Pt has received IV fluids 3 times since surgery. 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 Pt states her surgeon told her to push through the pain when swallowing. Pt states she has no energy. Pt states she has been experiencing vomiting and diarrhea. Pt states she has been taking zofran which has helped calm her nausea stating the Carafate helps with nauseous within 30 minutes allowing her to eat. Pt states she feels nauseous and feels she is too full to fit anything else in the evening. Pt states no solid foods will go down at all. Pt states she has tried different temperatures with out success. Pt states she cannot get in anymore than 15 ounces of fluid a day. Pt states even with the Carafate she cannot get in 1 whole protein shake. Pt does have celiac disease and hx of cushing's.  Pt states the last time she went to the rehydration clinic the nurse there asked if she needed the saline bag or the vitamin bag: Dietitian advised she ask Dr. Hassell Done at her appt with him this Friday. Pt states the hydration clinic told ehr if she needs to be hydrated again she will need a PIC line.  Pt states she is able to take her multivitamin.   Pt states her current swallowing issues are due to her previous mesenteric issues.  24 hr recall: 4 ounces of premier protein  decaff unsweet tea 6 ounces over 60 minutes 2 T cottage cheese 2 ounces of decaff unsweet tea sugar free jello  Pt states she does take Procare multivitamin and calcium  Surgery date: 02/04/2018 Surgery type: Sleeve  [facility] at [facility]: 255.5 Weight today:  248   [facility]  BODY COMP RESULTS  07/29/2018 08/05/2018   BMI (kg/m^2) 37.8 37.7   Fat Mass (lbs) 133.2 128.8   Fat  Free Mass (lbs) 115.4 119.2   Total Body Water (lbs) 85.4 88.2    Pts Plan: Experiment with thickness of liquids   -If you continue to only take in 15 ounces of fluid you need to be hydrated again -Take another multivitamin at about 2-3pm -Keep taking your calcium -Continue to eat every 2-3 hours and drinking in between that time  -Get some thick it water  -Try pureeing fish with soy milk and a drop of oil first and then if that does not go well add thick it with lots of seasoning  -start with 1 ounce every 10 minutes  -Try the protein pudding  -Try yogurt

## 2018-08-05 NOTE — Patient Instructions (Addendum)
-  Take another multivitamin at about 2-3pm  -Keep taking your calcium  -Continue to eat every 2-3 hours and drinking in between that time   -Get some thick it water   -Try pureeing fish with soy milk and a drop of oil first and then if that does not go well add thick it with lots of seasoning   -start with 1 ounce every 10 minutes   -Try the protein pudding   -Try yogurt

## 2018-08-07 ENCOUNTER — Ambulatory Visit (HOSPITAL_COMMUNITY)
Admission: RE | Admit: 2018-08-07 | Discharge: 2018-08-07 | Disposition: A | Discharge status: Home / Self Care | Payer: BLUE CROSS/BLUE SHIELD | Source: Ambulatory Visit | Attending: Surgery | Admitting: Surgery

## 2018-08-07 ENCOUNTER — Other Ambulatory Visit: Payer: Self-pay | Admitting: Surgery

## 2018-08-07 ENCOUNTER — Other Ambulatory Visit (HOSPITAL_COMMUNITY): Payer: Self-pay | Admitting: Surgery

## 2018-08-07 DIAGNOSIS — Z9884 Bariatric surgery status: Secondary | ICD-10-CM

## 2018-08-07 DIAGNOSIS — Z789 Other specified health status: Secondary | ICD-10-CM

## 2018-08-07 DIAGNOSIS — E860 Dehydration: Secondary | ICD-10-CM | POA: Diagnosis not present

## 2018-08-07 DIAGNOSIS — Z452 Encounter for adjustment and management of vascular access device: Secondary | ICD-10-CM | POA: Diagnosis not present

## 2018-08-07 MED ORDER — LIDOCAINE HCL 1 % IJ SOLN
INTRAMUSCULAR | Status: DC | PRN
  Administered 2018-08-07: 5 mL via INTRADERMAL

## 2018-08-07 MED ORDER — HEPARIN SOD (PORK) LOCK FLUSH 100 UNIT/ML IV SOLN
INTRAVENOUS | Status: AC
  Filled 2018-08-07: qty 5

## 2018-08-07 MED ORDER — HEPARIN SOD (PORK) LOCK FLUSH 100 UNIT/ML IV SOLN
INTRAVENOUS | Status: DC | PRN
  Administered 2018-08-07: 500 [IU]

## 2018-08-07 MED ORDER — LIDOCAINE HCL 1 % IJ SOLN
INTRAMUSCULAR | Status: AC
  Filled 2018-08-07: qty 20

## 2018-08-07 NOTE — Procedures (Signed)
RUE PICC SVC RA 43 cm EBL 0 Comp 0

## 2018-08-08 ENCOUNTER — Ambulatory Visit (HOSPITAL_COMMUNITY)
Admission: RE | Admit: 2018-08-08 | Discharge: 2018-08-08 | Disposition: A | Discharge status: Home / Self Care | Payer: BLUE CROSS/BLUE SHIELD | Source: Ambulatory Visit | Attending: Surgery | Admitting: Surgery

## 2018-08-08 ENCOUNTER — Other Ambulatory Visit: Payer: Self-pay | Admitting: Surgery

## 2018-08-08 DIAGNOSIS — E860 Dehydration: Secondary | ICD-10-CM | POA: Diagnosis not present

## 2018-08-08 MED ORDER — SODIUM CHLORIDE 0.9 % IV SOLN: INTRAVENOUS | Status: DC

## 2018-08-08 MED ORDER — SODIUM CHLORIDE 0.9% FLUSH
10.0000 mL | INTRAVENOUS | Status: AC | PRN
  Administered 2018-08-08: 10 mL

## 2018-08-08 MED ORDER — HEPARIN SOD (PORK) LOCK FLUSH 100 UNIT/ML IV SOLN
250.0000 [IU] | INTRAVENOUS | Status: AC | PRN
  Administered 2018-08-08: 250 [IU]
  Filled 2018-08-08: qty 5

## 2018-08-08 MED ORDER — SODIUM CHLORIDE 0.9 % IV BOLUS
2000.0000 mL | Freq: Once | INTRAVENOUS | Status: AC
  Administered 2018-08-08: 2000 mL via INTRAVENOUS

## 2018-08-08 NOTE — Progress Notes (Signed)
PATIENT CARE CENTER NOTE  Diagnosis: Dehydration   Provider: Matthew Martin, MD   Procedure: 2 Liter fluid bolus   Note: Patient received 2 Liter bolus of 0.9% Sodium Chloride. Patient tolerated bolus well. Vital signs stable. Discharge instructions given. Patient alert, oriented and ambulatory at discharge.           

## 2018-08-08 NOTE — Discharge Instructions (Signed)
Dehydration, Adult Dehydration is when there is not enough fluid or water in your body. This happens when you lose more fluids than you take in. Dehydration can range from mild to very bad. It should be treated right away to keep it from getting very bad. Symptoms of mild dehydration may include:  Thirst.  Dry lips.  Slightly dry mouth.  Dry, warm skin.  Dizziness. Symptoms of moderate dehydration may include:  Very dry mouth.  Muscle cramps.  Dark pee (urine). Pee may be the color of tea.  Your body making less pee.  Your eyes making fewer tears.  Heartbeat that is uneven or faster than normal (palpitations).  Headache.  Light-headedness, especially when you stand up from sitting.  Fainting (syncope). Symptoms of very bad dehydration may include:  Changes in skin, such as: ? Cold and clammy skin. ? Blotchy (mottled) or pale skin. ? Skin that does not quickly return to normal after being lightly pinched and let go (poor skin turgor).  Changes in body fluids, such as: ? Feeling very thirsty. ? Your eyes making fewer tears. ? Not sweating when body temperature is high, such as in hot weather. ? Your body making very little pee.  Changes in vital signs, such as: ? Weak pulse. ? Pulse that is more than 100 beats a minute when you are sitting still. ? Fast breathing. ? Low blood pressure.  Other changes, such as: ? Sunken eyes. ? Cold hands and feet. ? Confusion. ? Lack of energy (lethargy). ? Trouble waking up from sleep. ? Short-term weight loss. ? Unconsciousness. Follow these instructions at home:  If told by your doctor, drink an ORS: ? Make an ORS by using instructions on the package. ? Start by drinking small amounts, about  cup (120 mL) every 5-10 minutes. ? Slowly drink more until you have had the amount that your doctor said to have.  Drink enough clear fluid to keep your pee clear or pale yellow. If you were told to drink an ORS, finish the ORS  first, then start slowly drinking clear fluids. Drink fluids such as: ? Water. Do not drink only water by itself. Doing that can make the salt (sodium) level in your body get too low (hyponatremia). ? Ice chips. ? Fruit juice that you have added water to (diluted). ? Low-calorie sports drinks.  Avoid: ? Alcohol. ? Drinks that have a lot of sugar. These include high-calorie sports drinks, fruit juice that does not have water added, and soda. ? Caffeine. ? Foods that are greasy or have a lot of fat or sugar.  Take over-the-counter and prescription medicines only as told by your doctor.  Do not take salt tablets. Doing that can make the salt level in your body get too high (hypernatremia).  Eat foods that have minerals (electrolytes). Examples include bananas, oranges, potatoes, tomatoes, and spinach.  Keep all follow-up visits as told by your doctor. This is important. Contact a doctor if:  You have belly (abdominal) pain that: ? Gets worse. ? Stays in one area (localizes).  You have a rash.  You have a stiff neck.  You get angry or annoyed more easily than normal (irritability).  You are more sleepy than normal.  You have a harder time waking up than normal.  You feel: ? Weak. ? Dizzy. ? Very thirsty.  You have peed (urinated) only a small amount of very dark pee during 6-8 hours. Get help right away if:  You have symptoms of   very bad dehydration.  You cannot drink fluids without throwing up (vomiting).  Your symptoms get worse with treatment.  You have a fever.  You have a very bad headache.  You are throwing up or having watery poop (diarrhea) and it: ? Gets worse. ? Does not go away.  You have blood or something green (bile) in your throw-up.  You have blood in your poop (stool). This may cause poop to look black and tarry.  You have not peed in 6-8 hours.  You pass out (faint).  Your heart rate when you are sitting still is more than 100 beats a  minute.  You have trouble breathing. This information is not intended to replace advice given to you by your health care provider. Make sure you discuss any questions you have with your health care provider. Document Released: 08/11/2009 Document Revised: 05/04/2016 Document Reviewed: 12/09/2015 Elsevier Interactive Patient Education  2018 Elsevier Inc.  

## 2018-08-10 ENCOUNTER — Other Ambulatory Visit: Payer: Self-pay

## 2018-08-10 ENCOUNTER — Emergency Department (HOSPITAL_COMMUNITY)
Admission: EM | Admit: 2018-08-10 | Discharge: 2018-08-10 | Disposition: A | Discharge status: Home / Self Care | Payer: BLUE CROSS/BLUE SHIELD | Attending: Emergency Medicine | Admitting: Emergency Medicine

## 2018-08-10 ENCOUNTER — Emergency Department (HOSPITAL_COMMUNITY): Payer: BLUE CROSS/BLUE SHIELD

## 2018-08-10 ENCOUNTER — Encounter (HOSPITAL_COMMUNITY): Payer: Self-pay | Admitting: Emergency Medicine

## 2018-08-10 DIAGNOSIS — K7689 Other specified diseases of liver: Secondary | ICD-10-CM | POA: Diagnosis not present

## 2018-08-10 DIAGNOSIS — R1013 Epigastric pain: Secondary | ICD-10-CM

## 2018-08-10 DIAGNOSIS — R110 Nausea: Secondary | ICD-10-CM | POA: Insufficient documentation

## 2018-08-10 DIAGNOSIS — R531 Weakness: Secondary | ICD-10-CM | POA: Insufficient documentation

## 2018-08-10 DIAGNOSIS — K228 Other specified diseases of esophagus: Secondary | ICD-10-CM | POA: Diagnosis not present

## 2018-08-10 DIAGNOSIS — R1114 Bilious vomiting: Secondary | ICD-10-CM

## 2018-08-10 DIAGNOSIS — R5383 Other fatigue: Secondary | ICD-10-CM | POA: Insufficient documentation

## 2018-08-10 DIAGNOSIS — Z79899 Other long term (current) drug therapy: Secondary | ICD-10-CM | POA: Insufficient documentation

## 2018-08-10 DIAGNOSIS — R109 Unspecified abdominal pain: Secondary | ICD-10-CM | POA: Diagnosis not present

## 2018-08-10 DIAGNOSIS — J45909 Unspecified asthma, uncomplicated: Secondary | ICD-10-CM | POA: Diagnosis not present

## 2018-08-10 DIAGNOSIS — R112 Nausea with vomiting, unspecified: Secondary | ICD-10-CM | POA: Diagnosis not present

## 2018-08-10 LAB — COMPREHENSIVE METABOLIC PANEL
ALT: 35 U/L (ref 0–44)
ANION GAP: 12 (ref 5–15)
AST: 26 U/L (ref 15–41)
Albumin: 4.2 g/dL (ref 3.5–5.0)
Alkaline Phosphatase: 61 U/L (ref 38–126)
BUN: 14 mg/dL (ref 6–20)
CALCIUM: 9.1 mg/dL (ref 8.9–10.3)
CHLORIDE: 107 mmol/L (ref 98–111)
CO2: 23 mmol/L (ref 22–32)
CREATININE: 0.62 mg/dL (ref 0.44–1.00)
Glucose, Bld: 92 mg/dL (ref 70–99)
Potassium: 3.9 mmol/L (ref 3.5–5.1)
Sodium: 142 mmol/L (ref 135–145)
Total Bilirubin: 0.5 mg/dL (ref 0.3–1.2)
Total Protein: 7.4 g/dL (ref 6.5–8.1)

## 2018-08-10 LAB — CBC WITH DIFFERENTIAL/PLATELET
Abs Immature Granulocytes: 0.04 10*3/uL (ref 0.00–0.07)
BASOS ABS: 0 10*3/uL (ref 0.0–0.1)
BASOS PCT: 0 %
EOS ABS: 0.2 10*3/uL (ref 0.0–0.5)
EOS PCT: 2 %
HCT: 39.9 % (ref 36.0–46.0)
Hemoglobin: 12.8 g/dL (ref 12.0–15.0)
Immature Granulocytes: 1 %
Lymphocytes Relative: 21 %
Lymphs Abs: 1.8 10*3/uL (ref 0.7–4.0)
MCH: 31.1 pg (ref 26.0–34.0)
MCHC: 32.1 g/dL (ref 30.0–36.0)
MCV: 96.8 fL (ref 80.0–100.0)
MONO ABS: 0.6 10*3/uL (ref 0.1–1.0)
Monocytes Relative: 7 %
NRBC: 0 % (ref 0.0–0.2)
Neutro Abs: 6 10*3/uL (ref 1.7–7.7)
Neutrophils Relative %: 69 %
PLATELETS: 206 10*3/uL (ref 150–400)
RBC: 4.12 MIL/uL (ref 3.87–5.11)
RDW: 12.9 % (ref 11.5–15.5)
WBC: 8.8 10*3/uL (ref 4.0–10.5)

## 2018-08-10 LAB — LIPASE, BLOOD: LIPASE: 55 U/L — AB (ref 11–51)

## 2018-08-10 MED ORDER — SODIUM CHLORIDE 0.9 % IJ SOLN
INTRAMUSCULAR | Status: AC
  Filled 2018-08-10: qty 50

## 2018-08-10 MED ORDER — SODIUM CHLORIDE 0.9 % IV BOLUS
1000.0000 mL | Freq: Once | INTRAVENOUS | Status: AC
  Administered 2018-08-10: 1000 mL via INTRAVENOUS

## 2018-08-10 MED ORDER — ONDANSETRON 4 MG PO TBDP: ORAL_TABLET | ORAL | 0 refills | Status: DC

## 2018-08-10 MED ORDER — IOHEXOL 300 MG/ML  SOLN: 75.0000 mL | Freq: Once | INTRAMUSCULAR | Status: DC | PRN

## 2018-08-10 MED ORDER — IOHEXOL 300 MG/ML  SOLN
15.0000 mL | Freq: Once | INTRAMUSCULAR | Status: AC | PRN
  Administered 2018-08-10: 15 mL via ORAL

## 2018-08-10 MED ORDER — HYDROMORPHONE HCL 1 MG/ML IJ SOLN
1.0000 mg | Freq: Once | INTRAMUSCULAR | Status: AC
  Administered 2018-08-10: 1 mg via INTRAVENOUS
  Filled 2018-08-10: qty 1

## 2018-08-10 MED ORDER — FENTANYL CITRATE (PF) 100 MCG/2ML IJ SOLN
50.0000 ug | Freq: Once | INTRAMUSCULAR | Status: AC
  Administered 2018-08-10: 50 ug via INTRAVENOUS
  Filled 2018-08-10: qty 2

## 2018-08-10 MED ORDER — ONDANSETRON HCL 4 MG/2ML IJ SOLN
4.0000 mg | Freq: Once | INTRAMUSCULAR | Status: AC
  Administered 2018-08-10: 4 mg via INTRAVENOUS
  Filled 2018-08-10: qty 2

## 2018-08-10 MED ORDER — HEPARIN SOD (PORK) LOCK FLUSH 100 UNIT/ML IV SOLN
500.0000 [IU] | Freq: Once | INTRAVENOUS | Status: AC
  Administered 2018-08-10: 500 [IU]
  Filled 2018-08-10: qty 5

## 2018-08-10 MED ORDER — FENTANYL CITRATE (PF) 100 MCG/2ML IJ SOLN
25.0000 ug | Freq: Once | INTRAMUSCULAR | Status: AC
  Administered 2018-08-10: 25 ug via INTRAVENOUS
  Filled 2018-08-10: qty 2

## 2018-08-10 MED ORDER — IOPAMIDOL (ISOVUE-300) INJECTION 61%: 30.0000 mL | Freq: Once | INTRAVENOUS | Status: DC | PRN

## 2018-08-10 MED ORDER — IOPAMIDOL (ISOVUE-300) INJECTION 61%
100.0000 mL | Freq: Once | INTRAVENOUS | Status: AC | PRN
  Administered 2018-08-10: 100 mL via INTRAVENOUS

## 2018-08-10 NOTE — ED Notes (Signed)
Patient transported to CT 

## 2018-08-10 NOTE — Progress Notes (Addendum)
Subjective S/p laparoscopic sleeve gastrectomy + primary HH repair 07/15/18 with Dr. Hassell Done. Intraoperative EGD demonstrated a widely patent normal appearing sleeve and repair. She has had some issues with dysphagia/PO tolerance and now has a PICC line for maintenance fluids. She saw Dr. Hassell Done on Friday and was scheduled for an UGI to further evaluate. She called and stated she had significant worsening of her MEG pain that she describes as a "wringing out" sensation, made worse with swallowing. Denies f/c/nausea. Denies chest pain or sob. She did not feel she could wait until her UGI and needed to be seen sooner as she believed something was wrong with her sleeve/repair. She presented to the ED for further evaluation.  She has had a prior cholecystectomy  On exam, AF, normal vitals; she had well healed incisions and a non-tender abdomen. Non-distended.  Lab work was noted to unremarkable (lipase very mildly elevated essentially upper limits of normal)  She underwent CTs with contrast by the ED team which were normal.   Objective: Vital signs in last 24 hours: Temp:  [98 F (36.7 C)] 98 F (36.7 C) (10/13 1222) Pulse Rate:  [76-81] 76 (10/13 1553) Resp:  [16] 16 (10/13 1553) BP: (127-133)/(75-95) 133/75 (10/13 1553) SpO2:  [99 %-100 %] 100 % (10/13 1553) Weight:  [111.6 kg] 111.6 kg (10/13 1223)    Intake/Output from previous day: No intake/output data recorded. Intake/Output this shift: Total I/O In: 1000 [IV Piggyback:1000] Out: -   Gen: NAD, comfortable CV: RRR Pulm: Normal work of breathing Abd: Soft, well healed incisions and a non-tender abdomen. Non-distended Ext: SCDs in place  Lab Results: CBC  Recent Labs    08/10/18 1331  WBC 8.8  HGB 12.8  HCT 39.9  PLT 206   BMET Recent Labs    08/10/18 1331  NA 142  K 3.9  CL 107  CO2 23  GLUCOSE 92  BUN 14  CREATININE 0.62  CALCIUM 9.1   PT/INR No results for input(s): LABPROT, INR in the last 72  hours. ABG No results for input(s): PHART, HCO3 in the last 72 hours.  Invalid input(s): PCO2, PO2  Studies/Results:  Anti-infectives: Anti-infectives (From admission, onward)   None       Assessment/Plan: Patient Active Problem List   Diagnosis Date Noted  . 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 Acute appendicitis 02/28/2017  . HNP (herniated nucleus pulposus), lumbar 06/22/2016  . Nausea with vomiting 11/30/2013  . Abdominal pain, epigastric 11/30/2013  . Dehydration 11/30/2013  . Acute pancreatitis 11/30/2013  . Pancreatitis 11/29/2013  . Gastroenteritis due to norovirus 11/26/2013  . Hypomagnesemia 11/25/2013  . Nausea vomiting and diarrhea 11/23/2013  . Anemia of chronic disease 11/23/2013  . Hypokalemia 11/23/2013  . Hyponatremia 11/23/2013  . Nausea and vomiting 11/23/2013  . Iatrogenic Cushing's syndrome ([place]) 10/02/2013  . Endometriosis 09/01/2013  . Celiac disease 09/01/2013  . Asthma, chronic 09/01/2013  . Superior mesenteric artery syndrome ([place]) 09/01/2013  . POTS (postural orthostatic tachycardia syndrome) 11/26/2011   PLAN -Dynamic UGI planned as an outpt to evaluate emptying of esophagus and function -No urgent/emergent issues noted on her quite thorough workup -I recommended she continue taking Carafate as prescribed and is being prescribed some medication for nausea as well   LOS: 0 days   Sharon Mt. Dema Severin, M.D. [facility]

## 2018-08-10 NOTE — ED Triage Notes (Signed)
Pt c/o epigastric / chest pain for 3-4 days  That has been gradually worsening. Pt states she had gastric sleeve surgery 07/15/18 and has been treated for dehydration because she hasn't been able to eat or drink (pt with PICC). Pt was given carafate and took yesterday but has not seen improvement. Spoke with Dr. Cliffton Asters [facility]) who advised pt to come to ed for CT.

## 2018-08-10 NOTE — ED Notes (Signed)
Dr Cliffton Asters in to talk with pt and examine her.

## 2018-08-10 NOTE — Discharge Instructions (Addendum)
Follow-up with your GI doctor as planned for the upper GI.  Return if any problems.

## 2018-08-10 NOTE — ED Provider Notes (Signed)
[facility] DEPT Provider Note   CSN: [id number] Arrival date & time: 08/10/18  1212     History   Chief Complaint Chief Complaint  Patient presents with  . Abdominal Pain  . Chest Pain    HPI Kaitlyn Johnson is a 37 y.o. female.  HPI  Patient presents with concern of subxiphoid and sternal discomfort as well as nausea, weakness, fatigue. She has a notable history of gastric sleeve procedure 3 weeks ago. She has been seen multiple times since the procedure due to nausea, lack of p.o. tolerance, has been receiving IV fluids, but has had worsening pain recently, primarily about the aforementioned area. No lower abdominal pain, no fever. Pain is worse with activity, has his fatigue and dyspnea. Pain is sharp, severe, not improved with anything. She spoke with her surgeon on call and I have her here for evaluation.   Past Medical History:  Diagnosis Date  . Anemia 11/26/2011  . Anxiety   . Asthma   . Celiac and mesenteric artery injury   . Cushing's syndrome ([place])    06/21/16- "in remission"  . Depression   . H/O hiatal hernia   . History of kidney stones   . POTS (postural orthostatic tachycardia syndrome)   . Shortness of breath dyspnea    with exertertion  . Sphincter of Oddi dysfunction   . Von Willebrand disease ([place])    bruising easy    Patient Active Problem List   Diagnosis Date Noted  . 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 Acute appendicitis 02/28/2017  . HNP (herniated nucleus pulposus), lumbar 06/22/2016  . Nausea with vomiting 11/30/2013  . Abdominal pain, epigastric 11/30/2013  . Dehydration 11/30/2013  . Acute pancreatitis 11/30/2013  . Pancreatitis 11/29/2013  . Gastroenteritis due to norovirus 11/26/2013  . Hypomagnesemia 11/25/2013  . Nausea vomiting and diarrhea 11/23/2013  . Anemia of chronic disease 11/23/2013  . Hypokalemia 11/23/2013  . Hyponatremia 11/23/2013  . Nausea and  vomiting 11/23/2013  . Iatrogenic Cushing's syndrome ([place]) 10/02/2013  . Endometriosis 09/01/2013  . Celiac disease 09/01/2013  . Asthma, chronic 09/01/2013  . 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 POTS (postural orthostatic tachycardia syndrome) 11/26/2011    Past Surgical History:  Procedure Laterality Date  . ABDOMINAL HYSTERECTOMY  08/2010  . ANTERIOR CERVICAL DECOMP/DISCECTOMY FUSION N/A 03/31/2013   Procedure: ANTERIOR CERVICAL DECOMPRESSION/DISCECTOMY FUSION 1 LEVEL Cervical five-six;  Surgeon: Faythe Ghee, MD;  Location: [facility];  Service: Neurosurgery;  Laterality: N/A;  . APPENDECTOMY    . Cholangio-Pancreatography with Spintectorotomy + Stent  07/16/2012   01/15/14  . CHOLECYSTECTOMY    . LAPAROSCOPIC APPENDECTOMY N/A 02/28/2017   Procedure: APPENDECTOMY LAPAROSCOPIC;  Surgeon: Erroll Luna, MD;  Location: [facility];  Service: General;  Laterality: N/A;  . LAPAROSCOPIC ENDOMETRIOSIS FULGURATION    . LAPAROSCOPIC GASTRIC SLEEVE RESECTION N/A 07/15/2018   Procedure: LAPAROSCOPIC GASTRIC SLEEVE RESECTION, UPPER ENDO, ERAS Pathway;  Surgeon: Johnathan Hausen, MD;  Location: [facility];  Service: General;  Laterality: N/A;  . LUMBAR LAMINECTOMY    . LUMBAR LAMINECTOMY/DECOMPRESSION MICRODISCECTOMY Left 06/22/2016   Procedure: MICRODISCECTOMY LEFT LUMBAR FOUR-FIVE;  Surgeon: Consuella Lose, MD;  Location: [facility];  Service: Neurosurgery;  Laterality: Left;  . ROUX-EN-Y PROCEDURE  04/1999     OB History   None      Home Medications    Prior to Admission medications   Medication Sig Start Date End Date Taking? Authorizing Provider  albuterol (PROAIR  HFA) 108 (90 Base) MCG/ACT inhaler Inhale 1-2 puffs into the lungs every 6 (six) hours as needed for wheezing or shortness of breath.    Yes [provider]  AMBIEN CR 12.5 MG CR tablet Take 12.5 mg by mouth at bedtime. 10/31/15  Yes [provider]  clonazePAM (KLONOPIN) 1 MG tablet Take  1 tablet (1 mg total) by mouth 2 (two) times daily as needed for anxiety. Patient taking differently: Take 1 mg by mouth daily as needed for anxiety.  12/07/13  Yes Theodis Blaze, MD  diltiazem (CARDIZEM CD) 180 MG 24 hr capsule Take 180 mg by mouth daily.    Yes [provider]  DULoxetine (CYMBALTA) 30 MG capsule Take 30 mg by mouth daily.   Yes [provider]  famotidine (PEPCID) 20 MG tablet Take 20 mg by mouth daily as needed for heartburn or indigestion.   Yes [provider]  hydrOXYzine (ATARAX/VISTARIL) 10 MG tablet Take 10 mg by mouth at bedtime.    Yes [provider]  LATUDA 60 MG TABS Take 60 mg by mouth daily with supper.    Yes [provider]  levalbuterol (XOPENEX) 0.31 MG/3ML nebulizer solution Take 1 ampule by nebulization every 4 (four) hours as needed for wheezing.   Yes [provider]  metoprolol succinate (TOPROL-XL) 25 MG 24 hr tablet Take 25 mg by mouth See admin instructions. Take with 50 mg in the AM & PM. (41m total BID) 07/16/18  Yes [provider]  metoprolol succinate (TOPROL-XL) 50 MG 24 hr tablet Take 50 mg by mouth See admin instructions. Take with 246min the AM & PM. (7540motal BID)   Yes [provider]  montelukast (SINGULAIR) 10 MG tablet Take 10 mg by mouth at bedtime.   Yes [provider]  omeprazole (PRILOSEC) 20 MG capsule Take 1 capsule (20 mg total) by mouth daily. 12/31/16  Yes Leaphart, KenZack SealA-C  OVER THE COUNTER MEDICATION Take 1 tablet by mouth daily. Bariatric vitamin   Yes [provider]  traZODone (DESYREL) 100 MG tablet Take 300 mg by mouth at bedtime.    Yes [provider]    Family History Family History  Problem Relation Age of Onset  . Hypertension Mother     Social History Social History   Tobacco Use  . Smoking status: Never Smoker  . Smokeless tobacco: Never Used  Substance Use Topics  . Alcohol use: Yes     Alcohol/week: 5.0 standard drinks    Types: 5 Glasses of wine per week  . Drug use: No     Allergies   Bee venom; Reglan [metoclopramide]; Shellfish allergy; Wheat bran; Adhesive [tape]; Gluten meal; Sulfa antibiotics; Tartrazine; and Yellow dye   Review of Systems Review of Systems  Constitutional:       Per HPI, otherwise negative  HENT:       Per HPI, otherwise negative  Respiratory:       Per HPI, otherwise negative  Cardiovascular:       Per HPI, otherwise negative  Gastrointestinal: Positive for nausea. Negative for vomiting.  Endocrine:       Negative aside from HPI  Genitourinary:       Neg aside from HPI   Musculoskeletal:       Per HPI, otherwise negative  Skin: Negative.   Neurological: Positive for weakness. Negative for syncope.     Physical Exam Updated Vital Signs BP 133/75 (BP Location: Left Arm)  Pulse 76   Temp 98 F (36.7 C) (Oral)   Resp 16   Wt 111.6 kg   SpO2 100%   BMI 37.40 kg/m   Physical Exam  Constitutional: She is oriented to person, place, and time. She appears well-developed and well-nourished. No distress.  HENT:  Head: Normocephalic and atraumatic.  Eyes: Conjunctivae and EOM are normal.  Cardiovascular: Normal rate and regular rhythm.  Pulmonary/Chest: Effort normal and breath sounds normal. No stridor. No respiratory distress.  Abdominal: She exhibits no distension.    Musculoskeletal: She exhibits no edema.  Neurological: She is alert and oriented to person, place, and time. No cranial nerve deficit.  Skin: Skin is warm and dry.     Psychiatric: She has a normal mood and affect.  Nursing note and vitals reviewed.    ED Treatments / Results  Labs (all labs ordered are listed, but only abnormal results are displayed) Labs Reviewed  LIPASE, BLOOD - Abnormal; Notable for the following components:      Result Value   Lipase 55 (*)    All other components within normal limits  COMPREHENSIVE METABOLIC PANEL  CBC  WITH DIFFERENTIAL/PLATELET    EKG None  Radiology No results found.  Procedures Procedures (including critical care time)  Medications Ordered in ED Medications  sodium chloride 0.9 % bolus 1,000 mL (1,000 mLs Intravenous New Bag/Given 08/10/18 1524)  sodium chloride 0.9 % injection (0 mLs  Hold 08/10/18 1515)  iopamidol (ISOVUE-300) 61 % injection 30 mL ( Oral Canceled Entry 08/10/18 1533)  sodium chloride 0.9 % bolus 1,000 mL (0 mLs Intravenous Stopped 08/10/18 1515)  ondansetron (ZOFRAN) injection 4 mg (4 mg Intravenous Given 08/10/18 1338)  fentaNYL (SUBLIMAZE) injection 25 mcg (25 mcg Intravenous Given 08/10/18 1339)  HYDROmorphone (DILAUDID) injection 1 mg (1 mg Intravenous Given 08/10/18 1420)  ondansetron (ZOFRAN) injection 4 mg (4 mg Intravenous Given 08/10/18 1441)  fentaNYL (SUBLIMAZE) injection 50 mcg (50 mcg Intravenous Given 08/10/18 1524)  iopamidol (ISOVUE-300) 61 % injection 100 mL (100 mLs Intravenous Contrast Given 08/10/18 1533)  iohexol (OMNIPAQUE) 300 MG/ML solution 15 mL (15 mLs Oral Contrast Given 08/10/18 1533)     Initial Impression / Assessment and Plan / ED Course  I have reviewed the triage vital signs and the nursing notes.  Pertinent labs & imaging results that were available during my care of the patient were reviewed by me and considered in my medical decision making (see chart for details).    Chart review notable for recent gastric sleeve procedure, recent ED valuation for nausea.  3:56 PM Patient has had some reduction in her pain, though inconsistently with either fentanyl or Dilaudid. Vital signs remained unremarkable, initial labs unremarkable aside from mild elevation in lipase. Zofran has been provided, and the patient has had no additional episodes of emesis. Our general surgery team has seen the patient in consultation, and we are awaiting results of CT scan.  Patient will have results followed by Dr. Roderic Palau, and gen  surg.   Final Clinical Impressions(s) / ED Diagnoses  Abdominal pain Nausea and vomiting   Carmin Muskrat, MD 08/10/18 1557

## 2018-08-10 NOTE — ED Provider Notes (Signed)
CT scan unremarkable.  Patient was seen by general surgery and felt like the patient could be discharged home on some nausea medicine and follow-up with her GI this week to have her upper GI   Bethann Berkshire, MD 08/10/18 1706

## 2018-08-10 NOTE — ED Notes (Signed)
Spoke with Dr Cliffton Asters to let him know test results are back. Nothing emergent noted. She has appointment to follow up with GI Next week. Dr Estell Harpin aware.

## 2018-08-11 ENCOUNTER — Other Ambulatory Visit: Payer: Self-pay | Admitting: Surgery

## 2018-08-11 DIAGNOSIS — Z9884 Bariatric surgery status: Secondary | ICD-10-CM

## 2018-08-12 ENCOUNTER — Ambulatory Visit (HOSPITAL_COMMUNITY)
Admission: RE | Admit: 2018-08-12 | Discharge: 2018-08-12 | Disposition: A | Discharge status: Home / Self Care | Payer: BLUE CROSS/BLUE SHIELD | Source: Ambulatory Visit | Attending: Emergency Medicine | Admitting: Emergency Medicine

## 2018-08-12 ENCOUNTER — Other Ambulatory Visit: Payer: Self-pay | Admitting: Surgery

## 2018-08-12 DIAGNOSIS — Z87442 Personal history of urinary calculi: Secondary | ICD-10-CM | POA: Diagnosis not present

## 2018-08-12 DIAGNOSIS — D680 Von Willebrand's disease: Secondary | ICD-10-CM | POA: Diagnosis not present

## 2018-08-12 DIAGNOSIS — R197 Diarrhea, unspecified: Secondary | ICD-10-CM | POA: Diagnosis not present

## 2018-08-12 DIAGNOSIS — R7881 Bacteremia: Secondary | ICD-10-CM | POA: Diagnosis not present

## 2018-08-12 DIAGNOSIS — T80211A Bloodstream infection due to central venous catheter, initial encounter: Principal | ICD-10-CM | POA: Diagnosis present

## 2018-08-12 DIAGNOSIS — Z9884 Bariatric surgery status: Secondary | ICD-10-CM

## 2018-08-12 DIAGNOSIS — K449 Diaphragmatic hernia without obstruction or gangrene: Secondary | ICD-10-CM | POA: Diagnosis present

## 2018-08-12 DIAGNOSIS — Z882 Allergy status to sulfonamides status: Secondary | ICD-10-CM | POA: Diagnosis not present

## 2018-08-12 DIAGNOSIS — Z90710 Acquired absence of both cervix and uterus: Secondary | ICD-10-CM | POA: Diagnosis not present

## 2018-08-12 DIAGNOSIS — Z888 Allergy status to other drugs, medicaments and biological substances status: Secondary | ICD-10-CM | POA: Diagnosis not present

## 2018-08-12 DIAGNOSIS — R509 Fever, unspecified: Secondary | ICD-10-CM | POA: Diagnosis not present

## 2018-08-12 DIAGNOSIS — E860 Dehydration: Secondary | ICD-10-CM | POA: Diagnosis present

## 2018-08-12 DIAGNOSIS — K2900 Acute gastritis without bleeding: Secondary | ICD-10-CM | POA: Diagnosis present

## 2018-08-12 DIAGNOSIS — R1110 Vomiting, unspecified: Secondary | ICD-10-CM | POA: Diagnosis not present

## 2018-08-12 DIAGNOSIS — R109 Unspecified abdominal pain: Secondary | ICD-10-CM | POA: Diagnosis not present

## 2018-08-12 DIAGNOSIS — B961 Klebsiella pneumoniae [K. pneumoniae] as the cause of diseases classified elsewhere: Secondary | ICD-10-CM | POA: Diagnosis present

## 2018-08-12 DIAGNOSIS — Z9049 Acquired absence of other specified parts of digestive tract: Secondary | ICD-10-CM | POA: Diagnosis not present

## 2018-08-12 DIAGNOSIS — Z980 Intestinal bypass and anastomosis status: Secondary | ICD-10-CM | POA: Diagnosis not present

## 2018-08-12 DIAGNOSIS — Z981 Arthrodesis status: Secondary | ICD-10-CM | POA: Diagnosis not present

## 2018-08-12 DIAGNOSIS — Z91013 Allergy to seafood: Secondary | ICD-10-CM | POA: Diagnosis not present

## 2018-08-12 DIAGNOSIS — E249 Cushing's syndrome, unspecified: Secondary | ICD-10-CM | POA: Diagnosis not present

## 2018-08-12 DIAGNOSIS — I498 Other specified cardiac arrhythmias: Secondary | ICD-10-CM | POA: Diagnosis present

## 2018-08-12 DIAGNOSIS — Z91030 Bee allergy status: Secondary | ICD-10-CM

## 2018-08-12 DIAGNOSIS — I951 Orthostatic hypotension: Secondary | ICD-10-CM | POA: Diagnosis not present

## 2018-08-12 DIAGNOSIS — K859 Acute pancreatitis without necrosis or infection, unspecified: Secondary | ICD-10-CM | POA: Diagnosis not present

## 2018-08-12 DIAGNOSIS — Z8249 Family history of ischemic heart disease and other diseases of the circulatory system: Secondary | ICD-10-CM

## 2018-08-12 DIAGNOSIS — F419 Anxiety disorder, unspecified: Secondary | ICD-10-CM | POA: Diagnosis present

## 2018-08-12 DIAGNOSIS — K900 Celiac disease: Secondary | ICD-10-CM | POA: Diagnosis not present

## 2018-08-12 DIAGNOSIS — D72819 Decreased white blood cell count, unspecified: Secondary | ICD-10-CM | POA: Diagnosis not present

## 2018-08-12 DIAGNOSIS — R112 Nausea with vomiting, unspecified: Secondary | ICD-10-CM | POA: Diagnosis not present

## 2018-08-12 DIAGNOSIS — Z95828 Presence of other vascular implants and grafts: Secondary | ICD-10-CM | POA: Diagnosis not present

## 2018-08-12 DIAGNOSIS — R1013 Epigastric pain: Secondary | ICD-10-CM | POA: Diagnosis not present

## 2018-08-12 LAB — CBC WITH DIFFERENTIAL/PLATELET
ABS IMMATURE GRANULOCYTES: 0.02 10*3/uL (ref 0.00–0.07)
BASOS ABS: 0 10*3/uL (ref 0.0–0.1)
Basophils Relative: 0 %
Eosinophils Absolute: 0.1 10*3/uL (ref 0.0–0.5)
Eosinophils Relative: 2 %
HEMATOCRIT: 39.8 % (ref 36.0–46.0)
HEMOGLOBIN: 12.6 g/dL (ref 12.0–15.0)
Immature Granulocytes: 0 %
LYMPHS ABS: 1.5 10*3/uL (ref 0.7–4.0)
LYMPHS PCT: 23 %
MCH: 30.4 pg (ref 26.0–34.0)
MCHC: 31.7 g/dL (ref 30.0–36.0)
MCV: 96.1 fL (ref 80.0–100.0)
MONO ABS: 0.6 10*3/uL (ref 0.1–1.0)
Monocytes Relative: 9 %
NEUTROS ABS: 4.5 10*3/uL (ref 1.7–7.7)
NRBC: 0 % (ref 0.0–0.2)
Neutrophils Relative %: 66 %
Platelets: 195 10*3/uL (ref 150–400)
RBC: 4.14 MIL/uL (ref 3.87–5.11)
RDW: 12.9 % (ref 11.5–15.5)
WBC: 6.8 10*3/uL (ref 4.0–10.5)

## 2018-08-12 LAB — COMPREHENSIVE METABOLIC PANEL
ALBUMIN: 4.2 g/dL (ref 3.5–5.0)
ALK PHOS: 59 U/L (ref 38–126)
ALT: 31 U/L (ref 0–44)
AST: 31 U/L (ref 15–41)
Anion gap: 11 (ref 5–15)
BUN: 13 mg/dL (ref 6–20)
CALCIUM: 9.3 mg/dL (ref 8.9–10.3)
CO2: 23 mmol/L (ref 22–32)
CREATININE: 0.73 mg/dL (ref 0.44–1.00)
Chloride: 108 mmol/L (ref 98–111)
GFR calc Af Amer: >60 mL/min (ref >60)
GFR calc non Af Amer: >60 mL/min (ref >60)
GLUCOSE: 91 mg/dL (ref 70–99)
Potassium: 3.9 mmol/L (ref 3.5–5.1)
Sodium: 142 mmol/L (ref 135–145)
Total Bilirubin: 0.7 mg/dL (ref 0.3–1.2)
Total Protein: 7.2 g/dL (ref 6.5–8.1)

## 2018-08-12 MED ORDER — HEPARIN SOD (PORK) LOCK FLUSH 100 UNIT/ML IV SOLN: 250.0000 [IU] | INTRAVENOUS | Status: DC | PRN

## 2018-08-12 MED ORDER — SODIUM CHLORIDE 0.9% FLUSH: 10.0000 mL | INTRAVENOUS | Status: DC | PRN

## 2018-08-12 MED ORDER — SODIUM CHLORIDE 0.9 % IV BOLUS
2000.0000 mL | Freq: Once | INTRAVENOUS | Status: AC
  Administered 2018-08-12: 2000 mL via INTRAVENOUS

## 2018-08-12 MED ORDER — SODIUM CHLORIDE 0.9 % IV SOLN: INTRAVENOUS | Status: DC

## 2018-08-12 MED ORDER — HYDROCODONE-ACETAMINOPHEN 5-325 MG PO TABS: 1.0000 | ORAL_TABLET | ORAL | 0 refills | Status: DC | PRN

## 2018-08-12 NOTE — Discharge Instructions (Signed)
Dehydration, Adult Dehydration is when there is not enough fluid or water in your body. This happens when you lose more fluids than you take in. Dehydration can range from mild to very bad. It should be treated right away to keep it from getting very bad. Symptoms of mild dehydration may include:  Thirst.  Dry lips.  Slightly dry mouth.  Dry, warm skin.  Dizziness. Symptoms of moderate dehydration may include:  Very dry mouth.  Muscle cramps.  Dark pee (urine). Pee may be the color of tea.  Your body making less pee.  Your eyes making fewer tears.  Heartbeat that is uneven or faster than normal (palpitations).  Headache.  Light-headedness, especially when you stand up from sitting.  Fainting (syncope). Symptoms of very bad dehydration may include:  Changes in skin, such as: ? Cold and clammy skin. ? Blotchy (mottled) or pale skin. ? Skin that does not quickly return to normal after being lightly pinched and let go (poor skin turgor).  Changes in body fluids, such as: ? Feeling very thirsty. ? Your eyes making fewer tears. ? Not sweating when body temperature is high, such as in hot weather. ? Your body making very little pee.  Changes in vital signs, such as: ? Weak pulse. ? Pulse that is more than 100 beats a minute when you are sitting still. ? Fast breathing. ? Low blood pressure.  Other changes, such as: ? Sunken eyes. ? Cold hands and feet. ? Confusion. ? Lack of energy (lethargy). ? Trouble waking up from sleep. ? Short-term weight loss. ? Unconsciousness. Follow these instructions at home:  If told by your doctor, drink an ORS: ? Make an ORS by using instructions on the package. ? Start by drinking small amounts, about  cup (120 mL) every 5-10 minutes. ? Slowly drink more until you have had the amount that your doctor said to have.  Drink enough clear fluid to keep your pee clear or pale yellow. If you were told to drink an ORS, finish the ORS  first, then start slowly drinking clear fluids. Drink fluids such as: ? Water. Do not drink only water by itself. Doing that can make the salt (sodium) level in your body get too low (hyponatremia). ? Ice chips. ? Fruit juice that you have added water to (diluted). ? Low-calorie sports drinks.  Avoid: ? Alcohol. ? Drinks that have a lot of sugar. These include high-calorie sports drinks, fruit juice that does not have water added, and soda. ? Caffeine. ? Foods that are greasy or have a lot of fat or sugar.  Take over-the-counter and prescription medicines only as told by your doctor.  Do not take salt tablets. Doing that can make the salt level in your body get too high (hypernatremia).  Eat foods that have minerals (electrolytes). Examples include bananas, oranges, potatoes, tomatoes, and spinach.  Keep all follow-up visits as told by your doctor. This is important. Contact a doctor if:  You have belly (abdominal) pain that: ? Gets worse. ? Stays in one area (localizes).  You have a rash.  You have a stiff neck.  You get angry or annoyed more easily than normal (irritability).  You are more sleepy than normal.  You have a harder time waking up than normal.  You feel: ? Weak. ? Dizzy. ? Very thirsty.  You have peed (urinated) only a small amount of very dark pee during 6-8 hours. Get help right away if:  You have symptoms of   very bad dehydration.  You cannot drink fluids without throwing up (vomiting).  Your symptoms get worse with treatment.  You have a fever.  You have a very bad headache.  You are throwing up or having watery poop (diarrhea) and it: ? Gets worse. ? Does not go away.  You have blood or something green (bile) in your throw-up.  You have blood in your poop (stool). This may cause poop to look black and tarry.  You have not peed in 6-8 hours.  You pass out (faint).  Your heart rate when you are sitting still is more than 100 beats a  minute.  You have trouble breathing. This information is not intended to replace advice given to you by your health care provider. Make sure you discuss any questions you have with your health care provider. Document Released: 08/11/2009 Document Revised: 05/04/2016 Document Reviewed: 12/09/2015 Elsevier Interactive Patient Education  2018 Elsevier Inc.  

## 2018-08-12 NOTE — Progress Notes (Signed)
PATIENT CARE CENTER NOTE  Diagnosis: Dehydration   Provider: Luretha Tyndall, MD   Procedure: 2 Liter fluid bolus, lab draw and PICC dressing change   Note: Patient received 2 Liter bolus of 0.9% Sodium Chloride. Patient tolerated bolus well. Labs drawn from patient's PICC line and PICC line dressing changed using sterile technique. Vital signs stable. Discharge instructions given. Patient alert, oriented and ambulatory at discharge.

## 2018-08-13 ENCOUNTER — Ambulatory Visit
Admission: RE | Admit: 2018-08-13 | Discharge: 2018-08-13 | Disposition: A | Discharge status: Home / Self Care | Payer: BLUE CROSS/BLUE SHIELD | Source: Ambulatory Visit | Attending: Surgery | Admitting: Surgery

## 2018-08-13 ENCOUNTER — Other Ambulatory Visit: Payer: Self-pay | Admitting: Surgery

## 2018-08-13 DIAGNOSIS — Z9884 Bariatric surgery status: Secondary | ICD-10-CM | POA: Diagnosis not present

## 2018-08-15 ENCOUNTER — Ambulatory Visit (HOSPITAL_COMMUNITY)
Admission: RE | Admit: 2018-08-15 | Discharge: 2018-08-15 | Disposition: A | Discharge status: Home / Self Care | Payer: BLUE CROSS/BLUE SHIELD | Source: Ambulatory Visit | Attending: Emergency Medicine | Admitting: Emergency Medicine

## 2018-08-15 MED ORDER — SODIUM CHLORIDE 0.9 % IV BOLUS
2000.0000 mL | Freq: Once | INTRAVENOUS | Status: AC
  Administered 2018-08-15: 2000 mL via INTRAVENOUS

## 2018-08-15 MED ORDER — SODIUM CHLORIDE 0.9% FLUSH
10.0000 mL | INTRAVENOUS | Status: AC | PRN
  Administered 2018-08-15: 10 mL

## 2018-08-15 MED ORDER — HEPARIN SOD (PORK) LOCK FLUSH 100 UNIT/ML IV SOLN
250.0000 [IU] | INTRAVENOUS | Status: AC | PRN
  Administered 2018-08-15: 250 [IU]
  Filled 2018-08-15: qty 5

## 2018-08-15 NOTE — Progress Notes (Addendum)
PATIENT CARE CENTER NOTE  Diagnosis: Dehydration   Provider: Luretha Golob, MD   Procedure: 2 Liter fluid bolus   Note: Patient received 2 Liter bolus of 0.9% Sodium Chloride. Patient tolerated bolus well. Vital signs stable. Discharge instructions given. Patient alert, oriented and ambulatory at discharge.

## 2018-08-15 NOTE — Discharge Instructions (Signed)
Dehydration, Adult Dehydration is when there is not enough fluid or water in your body. This happens when you lose more fluids than you take in. Dehydration can range from mild to very bad. It should be treated right away to keep it from getting very bad. Symptoms of mild dehydration may include:  Thirst.  Dry lips.  Slightly dry mouth.  Dry, warm skin.  Dizziness. Symptoms of moderate dehydration may include:  Very dry mouth.  Muscle cramps.  Dark pee (urine). Pee may be the color of tea.  Your body making less pee.  Your eyes making fewer tears.  Heartbeat that is uneven or faster than normal (palpitations).  Headache.  Light-headedness, especially when you stand up from sitting.  Fainting (syncope). Symptoms of very bad dehydration may include:  Changes in skin, such as: ? Cold and clammy skin. ? Blotchy (mottled) or pale skin. ? Skin that does not quickly return to normal after being lightly pinched and let go (poor skin turgor).  Changes in body fluids, such as: ? Feeling very thirsty. ? Your eyes making fewer tears. ? Not sweating when body temperature is high, such as in hot weather. ? Your body making very little pee.  Changes in vital signs, such as: ? Weak pulse. ? Pulse that is more than 100 beats a minute when you are sitting still. ? Fast breathing. ? Low blood pressure.  Other changes, such as: ? Sunken eyes. ? Cold hands and feet. ? Confusion. ? Lack of energy (lethargy). ? Trouble waking up from sleep. ? Short-term weight loss. ? Unconsciousness. Follow these instructions at home:  If told by your doctor, drink an ORS: ? Make an ORS by using instructions on the package. ? Start by drinking small amounts, about  cup (120 mL) every 5-10 minutes. ? Slowly drink more until you have had the amount that your doctor said to have.  Drink enough clear fluid to keep your pee clear or pale yellow. If you were told to drink an ORS, finish the ORS  first, then start slowly drinking clear fluids. Drink fluids such as: ? Water. Do not drink only water by itself. Doing that can make the salt (sodium) level in your body get too low (hyponatremia). ? Ice chips. ? Fruit juice that you have added water to (diluted). ? Low-calorie sports drinks.  Avoid: ? Alcohol. ? Drinks that have a lot of sugar. These include high-calorie sports drinks, fruit juice that does not have water added, and soda. ? Caffeine. ? Foods that are greasy or have a lot of fat or sugar.  Take over-the-counter and prescription medicines only as told by your doctor.  Do not take salt tablets. Doing that can make the salt level in your body get too high (hypernatremia).  Eat foods that have minerals (electrolytes). Examples include bananas, oranges, potatoes, tomatoes, and spinach.  Keep all follow-up visits as told by your doctor. This is important. Contact a doctor if:  You have belly (abdominal) pain that: ? Gets worse. ? Stays in one area (localizes).  You have a rash.  You have a stiff neck.  You get angry or annoyed more easily than normal (irritability).  You are more sleepy than normal.  You have a harder time waking up than normal.  You feel: ? Weak. ? Dizzy. ? Very thirsty.  You have peed (urinated) only a small amount of very dark pee during 6-8 hours. Get help right away if:  You have symptoms of   very bad dehydration.  You cannot drink fluids without throwing up (vomiting).  Your symptoms get worse with treatment.  You have a fever.  You have a very bad headache.  You are throwing up or having watery poop (diarrhea) and it: ? Gets worse. ? Does not go away.  You have blood or something green (bile) in your throw-up.  You have blood in your poop (stool). This may cause poop to look black and tarry.  You have not peed in 6-8 hours.  You pass out (faint).  Your heart rate when you are sitting still is more than 100 beats a  minute.  You have trouble breathing. This information is not intended to replace advice given to you by your health care provider. Make sure you discuss any questions you have with your health care provider. Document Released: 08/11/2009 Document Revised: 05/04/2016 Document Reviewed: 12/09/2015 Elsevier Interactive Patient Education  2018 Elsevier Inc.  

## 2018-08-16 ENCOUNTER — Other Ambulatory Visit: Payer: Self-pay

## 2018-08-16 ENCOUNTER — Inpatient Hospital Stay (HOSPITAL_COMMUNITY)
Admission: EM | Admit: 2018-08-16 | Discharge: 2018-08-25 | DRG: 315 | Disposition: A | Discharge status: Home Health | Payer: BLUE CROSS/BLUE SHIELD | Attending: Gastroenterology | Admitting: Gastroenterology

## 2018-08-16 ENCOUNTER — Encounter (HOSPITAL_COMMUNITY): Payer: Self-pay | Admitting: *Deleted

## 2018-08-16 ENCOUNTER — Emergency Department (HOSPITAL_COMMUNITY): Payer: BLUE CROSS/BLUE SHIELD

## 2018-08-16 DIAGNOSIS — Z91030 Bee allergy status: Secondary | ICD-10-CM | POA: Diagnosis not present

## 2018-08-16 DIAGNOSIS — E860 Dehydration: Secondary | ICD-10-CM

## 2018-08-16 DIAGNOSIS — Z91013 Allergy to seafood: Secondary | ICD-10-CM | POA: Diagnosis not present

## 2018-08-16 DIAGNOSIS — R109 Unspecified abdominal pain: Secondary | ICD-10-CM | POA: Diagnosis not present

## 2018-08-16 DIAGNOSIS — K2900 Acute gastritis without bleeding: Secondary | ICD-10-CM | POA: Diagnosis not present

## 2018-08-16 DIAGNOSIS — K449 Diaphragmatic hernia without obstruction or gangrene: Secondary | ICD-10-CM | POA: Diagnosis present

## 2018-08-16 DIAGNOSIS — Z980 Intestinal bypass and anastomosis status: Secondary | ICD-10-CM | POA: Diagnosis not present

## 2018-08-16 DIAGNOSIS — R197 Diarrhea, unspecified: Secondary | ICD-10-CM | POA: Diagnosis not present

## 2018-08-16 DIAGNOSIS — Z9884 Bariatric surgery status: Secondary | ICD-10-CM | POA: Diagnosis not present

## 2018-08-16 DIAGNOSIS — Z882 Allergy status to sulfonamides status: Secondary | ICD-10-CM | POA: Diagnosis not present

## 2018-08-16 DIAGNOSIS — Z95828 Presence of other vascular implants and grafts: Secondary | ICD-10-CM | POA: Diagnosis not present

## 2018-08-16 DIAGNOSIS — R7881 Bacteremia: Secondary | ICD-10-CM | POA: Diagnosis not present

## 2018-08-16 DIAGNOSIS — R1013 Epigastric pain: Secondary | ICD-10-CM

## 2018-08-16 DIAGNOSIS — R509 Fever, unspecified: Secondary | ICD-10-CM | POA: Diagnosis not present

## 2018-08-16 DIAGNOSIS — I951 Orthostatic hypotension: Secondary | ICD-10-CM | POA: Diagnosis not present

## 2018-08-16 DIAGNOSIS — R1110 Vomiting, unspecified: Secondary | ICD-10-CM | POA: Diagnosis not present

## 2018-08-16 DIAGNOSIS — Z9049 Acquired absence of other specified parts of digestive tract: Secondary | ICD-10-CM | POA: Diagnosis not present

## 2018-08-16 DIAGNOSIS — D680 Von Willebrand's disease: Secondary | ICD-10-CM | POA: Diagnosis present

## 2018-08-16 DIAGNOSIS — K859 Acute pancreatitis without necrosis or infection, unspecified: Secondary | ICD-10-CM | POA: Diagnosis not present

## 2018-08-16 DIAGNOSIS — Z8249 Family history of ischemic heart disease and other diseases of the circulatory system: Secondary | ICD-10-CM | POA: Diagnosis not present

## 2018-08-16 DIAGNOSIS — Z87442 Personal history of urinary calculi: Secondary | ICD-10-CM | POA: Diagnosis not present

## 2018-08-16 DIAGNOSIS — T80211A Bloodstream infection due to central venous catheter, initial encounter: Secondary | ICD-10-CM | POA: Diagnosis present

## 2018-08-16 DIAGNOSIS — K900 Celiac disease: Secondary | ICD-10-CM | POA: Diagnosis present

## 2018-08-16 DIAGNOSIS — Z90710 Acquired absence of both cervix and uterus: Secondary | ICD-10-CM | POA: Diagnosis not present

## 2018-08-16 DIAGNOSIS — R110 Nausea: Secondary | ICD-10-CM

## 2018-08-16 DIAGNOSIS — E249 Cushing's syndrome, unspecified: Secondary | ICD-10-CM | POA: Diagnosis present

## 2018-08-16 DIAGNOSIS — B961 Klebsiella pneumoniae [K. pneumoniae] as the cause of diseases classified elsewhere: Secondary | ICD-10-CM | POA: Diagnosis not present

## 2018-08-16 DIAGNOSIS — I498 Other specified cardiac arrhythmias: Secondary | ICD-10-CM | POA: Diagnosis present

## 2018-08-16 DIAGNOSIS — R112 Nausea with vomiting, unspecified: Secondary | ICD-10-CM | POA: Diagnosis not present

## 2018-08-16 DIAGNOSIS — D72819 Decreased white blood cell count, unspecified: Secondary | ICD-10-CM | POA: Diagnosis not present

## 2018-08-16 DIAGNOSIS — F419 Anxiety disorder, unspecified: Secondary | ICD-10-CM | POA: Diagnosis present

## 2018-08-16 DIAGNOSIS — Z981 Arthrodesis status: Secondary | ICD-10-CM | POA: Diagnosis not present

## 2018-08-16 DIAGNOSIS — Z888 Allergy status to other drugs, medicaments and biological substances status: Secondary | ICD-10-CM | POA: Diagnosis not present

## 2018-08-16 LAB — COMPREHENSIVE METABOLIC PANEL
ALK PHOS: 64 U/L (ref 38–126)
ALT: 26 U/L (ref 0–44)
AST: 24 U/L (ref 15–41)
Albumin: 4.1 g/dL (ref 3.5–5.0)
Anion gap: 10 (ref 5–15)
BUN: 5 mg/dL — ABNORMAL LOW (ref 6–20)
CALCIUM: 8.6 mg/dL — AB (ref 8.9–10.3)
CO2: 22 mmol/L (ref 22–32)
CREATININE: 0.67 mg/dL (ref 0.44–1.00)
Chloride: 105 mmol/L (ref 98–111)
GFR calc non Af Amer: >60 mL/min (ref >60)
Glucose, Bld: 113 mg/dL — ABNORMAL HIGH (ref 70–99)
Potassium: 3.5 mmol/L (ref 3.5–5.1)
SODIUM: 137 mmol/L (ref 135–145)
Total Bilirubin: 0.5 mg/dL (ref 0.3–1.2)
Total Protein: 7.3 g/dL (ref 6.5–8.1)

## 2018-08-16 LAB — URINALYSIS, ROUTINE W REFLEX MICROSCOPIC
BILIRUBIN URINE: NEGATIVE
GLUCOSE, UA: NEGATIVE mg/dL
HGB URINE DIPSTICK: NEGATIVE
KETONES UR: 5 mg/dL — AB
LEUKOCYTES UA: NEGATIVE
Nitrite: NEGATIVE
PROTEIN: NEGATIVE mg/dL
Specific Gravity, Urine: 1.03 (ref 1.005–1.030)
pH: 5 (ref 5.0–8.0)

## 2018-08-16 LAB — CBC
HCT: 40.7 % (ref 36.0–46.0)
Hemoglobin: 13.1 g/dL (ref 12.0–15.0)
MCH: 30.5 pg (ref 26.0–34.0)
MCHC: 32.2 g/dL (ref 30.0–36.0)
MCV: 94.7 fL (ref 80.0–100.0)
NRBC: 0 % (ref 0.0–0.2)
PLATELETS: 182 10*3/uL (ref 150–400)
RBC: 4.3 MIL/uL (ref 3.87–5.11)
RDW: 13 % (ref 11.5–15.5)
WBC: 4.5 10*3/uL (ref 4.0–10.5)

## 2018-08-16 LAB — I-STAT CG4 LACTIC ACID, ED: Lactic Acid, Venous: 1.04 mmol/L (ref 0.5–1.9)

## 2018-08-16 LAB — LIPASE, BLOOD: Lipase: 33 U/L (ref 11–51)

## 2018-08-16 MED ORDER — POLYETHYLENE GLYCOL 3350 17 G PO PACK: 17.0000 g | PACK | Freq: Every day | ORAL | Status: DC | PRN

## 2018-08-16 MED ORDER — ONDANSETRON HCL 4 MG/2ML IJ SOLN
4.0000 mg | INTRAMUSCULAR | Status: AC | PRN
  Administered 2018-08-16 – 2018-08-17 (×2): 4 mg via INTRAVENOUS
  Filled 2018-08-16 (×3): qty 2

## 2018-08-16 MED ORDER — ENOXAPARIN SODIUM 40 MG/0.4ML ~~LOC~~ SOLN
40.0000 mg | SUBCUTANEOUS | Status: DC
  Administered 2018-08-16 – 2018-08-24 (×8): 40 mg via SUBCUTANEOUS
  Filled 2018-08-16 (×9): qty 0.4

## 2018-08-16 MED ORDER — METOPROLOL SUCCINATE ER 25 MG PO TB24
25.0000 mg | ORAL_TABLET | Freq: Every day | ORAL | Status: DC
  Administered 2018-08-18 – 2018-08-24 (×6): 25 mg via ORAL
  Filled 2018-08-16 (×8): qty 1

## 2018-08-16 MED ORDER — ONDANSETRON 4 MG PO TBDP: 4.0000 mg | ORAL_TABLET | Freq: Four times a day (QID) | ORAL | Status: DC | PRN

## 2018-08-16 MED ORDER — DIPHENHYDRAMINE HCL 50 MG/ML IJ SOLN: 25.0000 mg | Freq: Four times a day (QID) | INTRAMUSCULAR | Status: DC | PRN

## 2018-08-16 MED ORDER — METOPROLOL TARTRATE 5 MG/5ML IV SOLN: 5.0000 mg | Freq: Four times a day (QID) | INTRAVENOUS | Status: DC | PRN

## 2018-08-16 MED ORDER — ACETAMINOPHEN 325 MG PO TABS
650.0000 mg | ORAL_TABLET | Freq: Four times a day (QID) | ORAL | Status: DC | PRN
  Administered 2018-08-16 – 2018-08-17 (×3): 650 mg via ORAL
  Filled 2018-08-16 (×3): qty 2

## 2018-08-16 MED ORDER — PROMETHAZINE HCL 25 MG/ML IJ SOLN
12.5000 mg | Freq: Four times a day (QID) | INTRAMUSCULAR | Status: DC | PRN
  Administered 2018-08-16 – 2018-08-20 (×10): 12.5 mg via INTRAVENOUS
  Filled 2018-08-16 (×11): qty 1

## 2018-08-16 MED ORDER — MORPHINE SULFATE (PF) 2 MG/ML IV SOLN
2.0000 mg | INTRAVENOUS | Status: DC | PRN
  Administered 2018-08-16 (×2): 2 mg via INTRAVENOUS
  Filled 2018-08-16 (×2): qty 1

## 2018-08-16 MED ORDER — FENTANYL CITRATE (PF) 100 MCG/2ML IJ SOLN
50.0000 ug | INTRAMUSCULAR | Status: AC | PRN
  Administered 2018-08-16 (×2): 50 ug via INTRAVENOUS
  Filled 2018-08-16 (×2): qty 2

## 2018-08-16 MED ORDER — ONDANSETRON HCL 4 MG/2ML IJ SOLN
4.0000 mg | Freq: Four times a day (QID) | INTRAMUSCULAR | Status: DC | PRN
  Administered 2018-08-16 – 2018-08-24 (×17): 4 mg via INTRAVENOUS
  Filled 2018-08-16 (×17): qty 2

## 2018-08-16 MED ORDER — METOPROLOL SUCCINATE ER 50 MG PO TB24
75.0000 mg | ORAL_TABLET | Freq: Two times a day (BID) | ORAL | Status: DC
  Administered 2018-08-16 – 2018-08-25 (×17): 75 mg via ORAL
  Filled 2018-08-16 (×17): qty 1

## 2018-08-16 MED ORDER — OXYCODONE HCL 5 MG/5ML PO SOLN
5.0000 mg | ORAL | Status: DC | PRN
  Administered 2018-08-16 – 2018-08-21 (×10): 10 mg via ORAL
  Filled 2018-08-16 (×2): qty 10
  Filled 2018-08-16: qty 5
  Filled 2018-08-16 (×9): qty 10

## 2018-08-16 MED ORDER — TRAMADOL HCL 50 MG PO TABS
50.0000 mg | ORAL_TABLET | Freq: Four times a day (QID) | ORAL | Status: DC | PRN
  Administered 2018-08-16 – 2018-08-23 (×2): 50 mg via ORAL
  Filled 2018-08-16 (×3): qty 1

## 2018-08-16 MED ORDER — OXYCODONE HCL 5 MG/5ML PO SOLN: 5.0000 mg | ORAL | Status: DC | PRN

## 2018-08-16 MED ORDER — FAMOTIDINE IN NACL 20-0.9 MG/50ML-% IV SOLN: 20.0000 mg | Freq: Two times a day (BID) | INTRAVENOUS | Status: DC

## 2018-08-16 MED ORDER — FAMOTIDINE IN NACL 20-0.9 MG/50ML-% IV SOLN
20.0000 mg | Freq: Two times a day (BID) | INTRAVENOUS | Status: DC
  Administered 2018-08-16 – 2018-08-20 (×8): 20 mg via INTRAVENOUS
  Filled 2018-08-16 (×8): qty 50

## 2018-08-16 MED ORDER — SODIUM CHLORIDE 0.9 % IV SOLN
INTRAVENOUS | Status: DC
  Administered 2018-08-16: 08:00:00 via INTRAVENOUS

## 2018-08-16 MED ORDER — ACETAMINOPHEN 650 MG RE SUPP: 650.0000 mg | Freq: Four times a day (QID) | RECTAL | Status: DC | PRN

## 2018-08-16 MED ORDER — DEXTROSE-NACL 5-0.45 % IV SOLN
INTRAVENOUS | Status: AC
  Administered 2018-08-16 – 2018-08-23 (×16): via INTRAVENOUS

## 2018-08-16 MED ORDER — DIPHENHYDRAMINE HCL 25 MG PO CAPS: 25.0000 mg | ORAL_CAPSULE | Freq: Four times a day (QID) | ORAL | Status: DC | PRN

## 2018-08-16 MED ORDER — FAMOTIDINE IN NACL 20-0.9 MG/50ML-% IV SOLN
20.0000 mg | Freq: Once | INTRAVENOUS | Status: AC
  Administered 2018-08-16: 20 mg via INTRAVENOUS
  Filled 2018-08-16: qty 50

## 2018-08-16 NOTE — Progress Notes (Signed)
MD notified that Tramadol is not relieving pts pain. New order given. Will continue to monitor [name], [name]

## 2018-08-16 NOTE — ED Triage Notes (Signed)
Pt c/o "esophageal pain" gradually worsening. Epigastric pain unrelieved by meds, she also has had difficulty with oral intake since having gastric sleeve surgery in September. She had PICC line placed and receives outpatient hydration. She reports having a fever tonight.

## 2018-08-16 NOTE — Progress Notes (Signed)
Dr. Sheliah Hatch aware via phone that pt's running 99.9 temp, flushed with chills, and feeling "bad" since taking Morphine. BP 145/91 and HR 114. Denies chest pain at this time. Epigastric pain level 5-6. See new orders received to restart pt's home beta blockers and home dose of Tramadol for pain.

## 2018-08-16 NOTE — ED Notes (Signed)
Pt is pointing to the epigastric area, complaining of a squeezing or wringing feeling. States unable to keep fluids down. Pt reports nausea currently.

## 2018-08-16 NOTE — ED Notes (Signed)
ED TO INPATIENT HANDOFF REPORT  Name/Age/Gender Kaitlyn Johnson 37 y.o. female  Code Status    Code Status Orders  (From admission, onward)         Start     Ordered   08/16/18 1103  Full code  Continuous     08/16/18 1105        Code Status History    Date Active Date Inactive Code Status Order ID Comments User Context   08/12/2018 1105 08/13/2018 0310 Full Code [id number]  Johnathan Hausen, MD HOV   08/08/2018 0829 08/09/2018 0304 Full Code [id number]  Alphonsa Overall, MD HOV   07/24/2018 1012 07/25/2018 0305 Full Code [id number]  Johnathan Hausen, MD HOV   07/15/2018 1158 07/17/2018 1339 Full Code [id number]  Johnathan Hausen, MD Inpatient   02/28/2017 1511 03/01/2017 0026 Full Code [id number]  Jerrye Beavers, PA-C ED   06/22/2016 1657 06/23/2016 1334 Full Code [id number]  Consuella Lose, MD Inpatient   11/30/2013 0058 12/07/2013 1718 Full Code [id number]  Kelvin Cellar, MD Inpatient   11/23/2013 1122 11/26/2013 1737 Full Code [id number]  Robbie Lis, MD ED      Home/SNF/Other Home  Chief Complaint Abd pain  Level of Care/Admitting Diagnosis ED Disposition    ED Disposition Condition [facility]: [facility] [100102]  Level of Care: Med-Surg [16]  Diagnosis: Dehydration [276.51.ICD-9-CM]  Admitting Physician: [place], [place]  Attending Physician: CCS, MD [[id number]]  Estimated length of stay: past midnight tomorrow  Certification:: I certify this patient will need inpatient services for at least 2 midnights  PT Class (Do Not Modify): Inpatient [101]  PT Acc Code (Do Not Modify): Private [1]       Medical History Past Medical History:  Diagnosis Date  . Anemia 11/26/2011  . Anxiety   . Asthma   . Celiac and mesenteric artery injury   . Cushing's syndrome ([place])    06/21/16- "in remission"  . Depression   . H/O hiatal hernia   . History of kidney stones   . POTS (postural orthostatic tachycardia syndrome)   . Shortness of breath  dyspnea    with exertertion  . Sphincter of Oddi dysfunction   . Von Willebrand disease ([place])    bruising easy    Allergies Allergies  Allergen Reactions  . Bee Venom Anaphylaxis  . Reglan [Metoclopramide] Other (See Comments)    Reaction:  Oculogyric crisis   . Shellfish Allergy Anaphylaxis  . Wheat Bran Other (See Comments)    Pt has celiac disease.    . Adhesive [Tape] Hives  . Gluten Meal Other (See Comments)    Pt has celiac disease.   . Sulfa Antibiotics Rash  . Tartrazine Rash  . Yellow Dye Rash    IV Location/Drains/Wounds Patient Lines/Drains/Airways Status   Active Line/Drains/Airways    Name:   Placement date:   Placement time:   Site:   Days:   PICC Double Lumen 75/64/33 PICC Right Basilic 43 cm   29/51/88    1549     9   Incision (Closed) 06/22/16 Back Other (Comment)   06/22/16    1337     785   Incision (Closed) 07/15/18 Abdomen   07/15/18    0933     32   Incision - 3 Ports Abdomen 1: Umbilicus 2: Upper 3: Lower   02/28/17    2249     534   Incision - 6 Ports Abdomen Right;Lateral Right;Medial  Left;Medial Left;Medial;Lateral Left;Lateral Mid;Upper   07/15/18    0805     32          Labs/Imaging Results for orders placed or performed during the hospital encounter of 08/16/18 (from the past 48 hour(s))  Lipase, blood     Status: None   Collection Time: 08/16/18  3:25 AM  Result Value Ref Range   Lipase 33 11 - 51 U/L    Comment: Performed at [facility], [place] [address], [place], [place] [address]  Comprehensive metabolic panel     Status: Abnormal   Collection Time: 08/16/18  3:25 AM  Result Value Ref Range   Sodium 137 135 - 145 mmol/L   Potassium 3.5 3.5 - 5.1 mmol/L   Chloride 105 98 - 111 mmol/L   CO2 22 22 - 32 mmol/L   Glucose, Bld 113 (H) 70 - 99 mg/dL   BUN 5 (L) 6 - 20 mg/dL   Creatinine, Ser 0.67 0.44 - 1.00 mg/dL   Calcium 8.6 (L) 8.9 - 10.3 mg/dL   Total Protein 7.3 6.5 - 8.1 g/dL   Albumin 4.1 3.5 - 5.0 g/dL    AST 24 15 - 41 U/L   ALT 26 0 - 44 U/L   Alkaline Phosphatase 64 38 - 126 U/L   Total Bilirubin 0.5 0.3 - 1.2 mg/dL   GFR calc non Af Amer >60 >60 mL/min   GFR calc Af Amer >60 >60 mL/min    Comment: (NOTE) The eGFR has been calculated using the CKD EPI equation. This calculation has not been validated in all clinical situations. eGFR's persistently <60 mL/min signify possible Chronic Kidney Disease.    Anion gap 10 5 - 15    Comment: Performed at [facility], [place] [address] [place], [place] [address]  CBC     Status: None   Collection Time: 08/16/18  3:25 AM  Result Value Ref Range   WBC 4.5 4.0 - 10.5 K/uL   RBC 4.30 3.87 - 5.11 MIL/uL   Hemoglobin 13.1 12.0 - 15.0 g/dL   HCT 40.7 36.0 - 46.0 %   MCV 94.7 80.0 - 100.0 fL   MCH 30.5 26.0 - 34.0 pg   MCHC 32.2 30.0 - 36.0 g/dL   RDW 13.0 11.5 - 15.5 %   Platelets 182 150 - 400 K/uL   nRBC 0.0 0.0 - 0.2 %    Comment: Performed at [facility], [place] [address], [place], [place] [address]  I-Stat CG4 Lactic Acid, ED     Status: None   Collection Time: 08/16/18  3:34 AM  Result Value Ref Range   Lactic Acid, Venous 1.04 0.5 - 1.9 mmol/L  Urinalysis, Routine w reflex microscopic     Status: Abnormal   Collection Time: 08/16/18  3:54 AM  Result Value Ref Range   Color, Urine YELLOW YELLOW   APPearance CLEAR CLEAR   Specific Gravity, Urine 1.030 1.005 - 1.030   pH 5.0 5.0 - 8.0   Glucose, UA NEGATIVE NEGATIVE mg/dL   Hgb urine dipstick NEGATIVE NEGATIVE   Bilirubin Urine NEGATIVE NEGATIVE   Ketones, ur 5 (A) NEGATIVE mg/dL   Protein, ur NEGATIVE NEGATIVE mg/dL   Nitrite NEGATIVE NEGATIVE   Leukocytes, UA NEGATIVE NEGATIVE    Comment: Performed at [facility] [address] [place], [place] [address]   Dg Abd Acute W/chest  Result Date: 08/16/2018 CLINICAL DATA:  Chronic abdominal pain, status post gastric  sleeve 1 month ago, persistent nausea, vomiting and fever  EXAM: DG ABDOMEN ACUTE W/ 1V CHEST COMPARISON:  08/10/2018, 08/13/2018 FINDINGS: Right upper extremity PICC line tip lower SVC level. Normal heart size and vascularity. Lungs remain clear. No focal pneumonia, collapse or consolidation. Negative for edema, effusion or pneumothorax. Trachea is midline. Lower cervical fusion hardware noted. No free air. Patient is status post gastric sleeve and previous cholecystectomy. Contrast throughout the nondilated colon from the previous upper GI series 3 days ago. No acute osseous finding. IMPRESSION: Negative for obstruction pattern or free air. No acute chest process Enteric contrast throughout the nondilated colon Electronically Signed   By: Jerilynn Mages.  Shick M.D.   On: 08/16/2018 09:09   None  Pending Labs Unresulted Labs (From admission, onward)    Start     Ordered   08/23/18 0500  Creatinine, serum  (enoxaparin (LOVENOX)    CrCl >/= 30 ml/min)  Weekly,   R    Comments:  while on enoxaparin therapy    08/16/18 1105   08/17/18 0500  Comprehensive metabolic panel  Daily,   R     08/16/18 1105   08/17/18 0500  CBC  Daily,   R     08/16/18 1105   08/16/18 1103  CBC  (enoxaparin (LOVENOX)    CrCl >/= 30 ml/min)  Once,   R    Comments:  Baseline for enoxaparin therapy IF NOT ALREADY DRAWN.  Notify MD if PLT < 100 K.    08/16/18 1105   08/16/18 1103  Creatinine, serum  (enoxaparin (LOVENOX)    CrCl >/= 30 ml/min)  Once,   R    Comments:  Baseline for enoxaparin therapy IF NOT ALREADY DRAWN.    08/16/18 1105   08/16/18 1103  HIV antibody (Routine Testing)  Once,   R     08/16/18 1105   08/16/18 0726  Urine culture  STAT,   STAT     08/16/18 0725   08/16/18 0726  Culture, blood (routine x 2)  BLOOD CULTURE X 2,   STAT     08/16/18 0725          Vitals/Pain Today's Vitals   08/16/18 0857 08/16/18 0951 08/16/18 1000 08/16/18 1106  BP:   101/65   Pulse:   88   Resp:   13   Temp:      TempSrc:      SpO2:   97%   Weight:      Height:      PainSc: _0 Isolation Precautions No active isolations  Medications Medications  0.9 %  sodium chloride infusion ( Intravenous New Bag/Given 08/16/18 0821)  ondansetron (ZOFRAN) injection 4 mg (4 mg Intravenous Given 08/16/18 0857)  enoxaparin (LOVENOX) injection 40 mg (has no administration in time range)  dextrose 5 %-0.45 % sodium chloride infusion (has no administration in time range)  acetaminophen (TYLENOL) tablet 650 mg (has no administration in time range)    Or  acetaminophen (TYLENOL) suppository 650 mg (has no administration in time range)  morphine 2 MG/ML injection 2 mg (has no administration in time range)  ondansetron (ZOFRAN-ODT) disintegrating tablet 4 mg (has no administration in time range)    Or  ondansetron (ZOFRAN) injection 4 mg (has no administration in time range)  metoprolol tartrate (LOPRESSOR) injection 5 mg (has no administration in time range)  diphenhydrAMINE (BENADRYL) capsule 25 mg (has no administration in time range)  Or  diphenhydrAMINE (BENADRYL) injection 25 mg (has no administration in time range)  promethazine (PHENERGAN) injection 12.5 mg (has no administration in time range)  polyethylene glycol (MIRALAX / GLYCOLAX) packet 17 g (has no administration in time range)  famotidine (PEPCID) IVPB 20 mg premix (has no administration in time range)  famotidine (PEPCID) IVPB 20 mg premix (0 mg Intravenous Stopped 08/16/18 0859)  fentaNYL (SUBLIMAZE) injection 50 mcg (50 mcg Intravenous Given 08/16/18 0958)    Mobility walks

## 2018-08-16 NOTE — ED Notes (Signed)
Urine culture sent to lab.

## 2018-08-16 NOTE — ED Provider Notes (Signed)
[facility] DEPT Provider Note   CSN: [id number] Arrival date & time: 08/16/18  0247     History   Chief Complaint Chief Complaint  Patient presents with  . Abdominal Pain    HPI Kaitlyn Johnson is a 37 y.o. female.  HPI  Pt was seen at 32.  Per pt, c/o gradual onset and persistence of constant mid-epigastric abd "pain" since her gastric sleeve surgery last month. Has been associated with nausea, intermittent vomiting, and decreased PO intake.  Describes the abd pain as "squeezing." Pt has been evaluated in the ED and by her General Surgeon multiple times for this complaint. Pt has had a PICC line place for hydration.  Pt most recently was evaluated in the ED 6 days ago with reassuring CT scan, and had outpatient IVF 3 times this past week, most recently yesterday. Pt states she developed a home fever to "100.7" last night. Denies diarrhea, no back pain, no rash, no CP/SOB, no cough, no sore throat, no black or blood in stools or emesis.      Past Medical History:  Diagnosis Date  . Anemia 11/26/2011  . Anxiety   . Asthma   . Celiac and mesenteric artery injury   . Cushing's syndrome ([place])    06/21/16- "in remission"  . Depression   . H/O hiatal hernia   . History of kidney stones   . POTS (postural orthostatic tachycardia syndrome)   . Shortness of breath dyspnea    with exertertion  . Sphincter of Oddi dysfunction   . Von Willebrand disease ([place])    bruising easy    Patient Active Problem List   Diagnosis Date Noted  . 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 Acute appendicitis 02/28/2017  . HNP (herniated nucleus pulposus), lumbar 06/22/2016  . Nausea with vomiting 11/30/2013  . Abdominal pain, epigastric 11/30/2013  . Dehydration 11/30/2013  . Acute pancreatitis 11/30/2013  . Pancreatitis 11/29/2013  . Gastroenteritis due to norovirus 11/26/2013  . Hypomagnesemia 11/25/2013  . Nausea vomiting and diarrhea  11/23/2013  . Anemia of chronic disease 11/23/2013  . Hypokalemia 11/23/2013  . Hyponatremia 11/23/2013  . Nausea and vomiting 11/23/2013  . Iatrogenic Cushing's syndrome ([place]) 10/02/2013  . Endometriosis 09/01/2013  . Celiac disease 09/01/2013  . Asthma, chronic 09/01/2013  . 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 POTS (postural orthostatic tachycardia syndrome) 11/26/2011    Past Surgical History:  Procedure Laterality Date  . ABDOMINAL HYSTERECTOMY  08/2010  . ANTERIOR CERVICAL DECOMP/DISCECTOMY FUSION N/A 03/31/2013   Procedure: ANTERIOR CERVICAL DECOMPRESSION/DISCECTOMY FUSION 1 LEVEL Cervical five-six;  Surgeon: Faythe Ghee, MD;  Location: [facility];  Service: Neurosurgery;  Laterality: N/A;  . APPENDECTOMY    . Cholangio-Pancreatography with Spintectorotomy + Stent  07/16/2012   01/15/14  . CHOLECYSTECTOMY    . LAPAROSCOPIC APPENDECTOMY N/A 02/28/2017   Procedure: APPENDECTOMY LAPAROSCOPIC;  Surgeon: Erroll Luna, MD;  Location: [facility];  Service: General;  Laterality: N/A;  . LAPAROSCOPIC ENDOMETRIOSIS FULGURATION    . LAPAROSCOPIC GASTRIC SLEEVE RESECTION N/A 07/15/2018   Procedure: LAPAROSCOPIC GASTRIC SLEEVE RESECTION, UPPER ENDO, ERAS Pathway;  Surgeon: Johnathan Hausen, MD;  Location: [facility];  Service: General;  Laterality: N/A;  . LUMBAR LAMINECTOMY    . LUMBAR LAMINECTOMY/DECOMPRESSION MICRODISCECTOMY Left 06/22/2016   Procedure: MICRODISCECTOMY LEFT LUMBAR FOUR-FIVE;  Surgeon: Consuella Lose, MD;  Location: [facility];  Service: Neurosurgery;  Laterality: Left;  . ROUX-EN-Y PROCEDURE  04/1999     OB  History   None      Home Medications    Prior to Admission medications   Medication Sig Start Date End Date Taking? Authorizing Provider  AMBIEN CR 12.5 MG CR tablet Take 12.5 mg by mouth at bedtime. 10/31/15  Yes [provider]  clonazePAM (KLONOPIN) 1 MG tablet Take 1 tablet (1 mg total) by mouth 2 (two) times daily as needed for  anxiety. Patient taking differently: Take 1 mg by mouth daily as needed for anxiety.  12/07/13  Yes Theodis Blaze, MD  diltiazem (CARDIZEM CD) 180 MG 24 hr capsule Take 180 mg by mouth daily.    Yes [provider]  DULoxetine (CYMBALTA) 30 MG capsule Take 30 mg by mouth daily.   Yes [provider]  famotidine (PEPCID) 20 MG tablet Take 20 mg by mouth daily as needed for heartburn or indigestion.   Yes [provider]  hydrOXYzine (ATARAX/VISTARIL) 10 MG tablet Take 10 mg by mouth at bedtime.    Yes [provider]  LATUDA 60 MG TABS Take 60 mg by mouth daily with supper.    Yes [provider]  metoprolol succinate (TOPROL-XL) 25 MG 24 hr tablet Take 25 mg by mouth See admin instructions. Take with 50 mg in the AM & PM. (32m total BID) 07/16/18  Yes [provider]  metoprolol succinate (TOPROL-XL) 50 MG 24 hr tablet Take 50 mg by mouth See admin instructions. Take with 294min the AM & PM. (7561motal BID)   Yes [provider]  montelukast (SINGULAIR) 10 MG tablet Take 10 mg by mouth at bedtime.   Yes [provider]  omeprazole (PRILOSEC) 20 MG capsule Take 1 capsule (20 mg total) by mouth daily. 12/31/16  Yes LeaOcie Cornfield PA-C  ondansetron (ZOFRAN ODT) 4 MG disintegrating tablet 4mg87mT q4 hours prn nausea/vomit 08/10/18  Yes ZammMilton Ferguson  OVER THE COUNTER MEDICATION Take 1 tablet by mouth daily. Bariatric vitamin   Yes [provider]  traMADol (ULTRAM) 50 MG tablet Take 50 mg by mouth 4 (four) times daily. 08/12/18  Yes [provider]  traZODone (DESYREL) 100 MG tablet Take 300 mg by mouth at bedtime.    Yes [provider]    Family History Family History  Problem Relation Age of Onset  . Hypertension Mother     Social History Social History   Tobacco Use  . Smoking status: Never Smoker  . Smokeless tobacco: Never Used  Substance Use Topics  . Alcohol use: Yes     Alcohol/week: 5.0 standard drinks    Types: 5 Glasses of wine per week  . Drug use: No     Allergies   Bee venom; Reglan [metoclopramide]; Shellfish allergy; Wheat bran; Adhesive [tape]; Gluten meal; Sulfa antibiotics; Tartrazine; and Yellow dye   Review of Systems Review of Systems ROS: Statement: All systems negative except as marked or noted in the HPI; Constitutional: +fever and chills. ; ; Eyes: Negative for eye pain, redness and discharge. ; ; ENMT: Negative for ear pain, hoarseness, nasal congestion, sinus pressure and sore throat. ; ; Cardiovascular: Negative for chest pain, palpitations, diaphoresis, dyspnea and peripheral edema. ; ; Respiratory: Negative for cough, wheezing and stridor. ; ; Gastrointestinal: +N/V, abd pain. Negative for diarrhea, blood in stool, hematemesis, jaundice and rectal bleeding. . ; ; Genitourinary: Negative for dysuria, flank pain and hematuria. ; ; Musculoskeletal: Negative for back pain and neck pain. Negative for swelling and trauma.; ;  Skin: Negative for pruritus, rash, abrasions, blisters, bruising and skin lesion.; ; Neuro: Negative for headache, lightheadedness and neck stiffness. Negative for weakness, altered level of consciousness, altered mental status, extremity weakness, paresthesias, involuntary movement, seizure and syncope.       Physical Exam Updated Vital Signs BP 119/75 (BP Location: Left Arm)   Pulse (!) 103   Temp 99 F (37.2 C) (Oral)   Resp 18   Ht 5' 8"  (1.727 m)   Wt 110.7 kg   SpO2 95%   BMI 37.10 kg/m    Patient Vitals for the past 24 hrs:  BP Temp Temp src Pulse Resp SpO2 Height Weight  08/16/18 0800 130/82 - - - (!) 27 - - -  08/16/18 0740 119/75 - - (!) 103 18 95 % - -  08/16/18 0509 (!) 139/92 99 F (37.2 C) Oral - 18 - - -  08/16/18 0500 (!) 139/92 - - - 18 - - -  08/16/18 0430 115/73 - - 82 18 97 % - -  08/16/18 0305 - - - - - - 5' 8"  (1.727 m) 110.7 kg  08/16/18 0304 134/87 99 F (37.2 C) Oral 98 16 97 % -  -     Physical Exam 0725: Physical examination:  Nursing notes reviewed; Vital signs and O2 SAT reviewed;  Constitutional: Well developed, Well nourished, Well hydrated, In no acute distress; Head:  Normocephalic, atraumatic; Eyes: EOMI, PERRL, No scleral icterus; ENMT: Mouth and pharynx normal, Mucous membranes moist; Neck: Supple, Full range of motion, No lymphadenopathy; Cardiovascular: Regular rate and rhythm, No gallop; Respiratory: Breath sounds clear & equal bilaterally, No wheezes.  Speaking full sentences with ease, Normal respiratory effort/excursion; Chest: Nontender, Movement normal; Abdomen: Soft, +mid-epigastric tenderness to palp. No rebound or guarding. Nondistended, Normal bowel sounds; Genitourinary: No CVA tenderness; Extremities: Peripheral pulses normal, No tenderness, No edema, No calf edema or asymmetry.; Neuro: AA&Ox3, Major CN grossly intact.  Speech clear. No gross focal motor or sensory deficits in extremities.; Skin: Color normal, Warm, Dry.; Psych:  Affect flat.    ED Treatments / Results  Labs (all labs ordered are listed, but only abnormal results are displayed)   EKG None  Radiology   Procedures Procedures (including critical care time)  Medications Ordered in ED Medications  0.9 %  sodium chloride infusion (has no administration in time range)  famotidine (PEPCID) IVPB 20 mg premix (has no administration in time range)  ondansetron (ZOFRAN) injection 4 mg (has no administration in time range)     Initial Impression / Assessment and Plan / ED Course  I have reviewed the triage vital signs and the nursing notes.  Pertinent labs & imaging results that were available during my care of the patient were reviewed by me and considered in my medical decision making (see chart for details).  MDM Reviewed: previous chart, nursing note and vitals Reviewed previous: labs and CT scan Interpretation: labs and x-ray   Results for orders placed or performed  during the hospital encounter of 08/16/18  Lipase, blood  Result Value Ref Range   Lipase 33 11 - 51 U/L  Comprehensive metabolic panel  Result Value Ref Range   Sodium 137 135 - 145 mmol/L   Potassium 3.5 3.5 - 5.1 mmol/L   Chloride 105 98 - 111 mmol/L   CO2 22 22 - 32 mmol/L   Glucose, Bld 113 (H) 70 - 99 mg/dL   BUN 5 (L) 6 - 20 mg/dL   Creatinine, Ser 0.67  0.44 - 1.00 mg/dL   Calcium 8.6 (L) 8.9 - 10.3 mg/dL   Total Protein 7.3 6.5 - 8.1 g/dL   Albumin 4.1 3.5 - 5.0 g/dL   AST 24 15 - 41 U/L   ALT 26 0 - 44 U/L   Alkaline Phosphatase 64 38 - 126 U/L   Total Bilirubin 0.5 0.3 - 1.2 mg/dL   GFR calc non Af Amer >60 >60 mL/min   GFR calc Af Amer >60 >60 mL/min   Anion gap 10 5 - 15  CBC  Result Value Ref Range   WBC 4.5 4.0 - 10.5 K/uL   RBC 4.30 3.87 - 5.11 MIL/uL   Hemoglobin 13.1 12.0 - 15.0 g/dL   HCT 40.7 36.0 - 46.0 %   MCV 94.7 80.0 - 100.0 fL   MCH 30.5 26.0 - 34.0 pg   MCHC 32.2 30.0 - 36.0 g/dL   RDW 13.0 11.5 - 15.5 %   Platelets 182 150 - 400 K/uL   nRBC 0.0 0.0 - 0.2 %  Urinalysis, Routine w reflex microscopic  Result Value Ref Range   Color, Urine YELLOW YELLOW   APPearance CLEAR CLEAR   Specific Gravity, Urine 1.030 1.005 - 1.030   pH 5.0 5.0 - 8.0   Glucose, UA NEGATIVE NEGATIVE mg/dL   Hgb urine dipstick NEGATIVE NEGATIVE   Bilirubin Urine NEGATIVE NEGATIVE   Ketones, ur 5 (A) NEGATIVE mg/dL   Protein, ur NEGATIVE NEGATIVE mg/dL   Nitrite NEGATIVE NEGATIVE   Leukocytes, UA NEGATIVE NEGATIVE  I-Stat CG4 Lactic Acid, ED  Result Value Ref Range   Lactic Acid, Venous 1.04 0.5 - 1.9 mmol/L   Ct Chest W Contrast Result Date: 08/10/2018 CLINICAL DATA:  Esophageal pain.  Unable to keep anything down. Pt c/o epigastric / chest pain for 3-4 days That has been gradually worsening. Pt states she had gastric sleeve surgery 07/15/18 and has been treated for dehydration because she hasn't been able to eat or drink (pt with PICC). Pt was given carafate and took  yesterday but has not seen improvement. Spoke with Dr. Dema Severin [facility]) who advised pt to come to ed for CT. Study performed with oral contrast given immediately before imaging. EXAM: CT CHEST AND ABDOMEN WITH CONTRAST TECHNIQUE: Multidetector CT imaging of the chest, abdomen and pelvis was performed following the standard protocol during bolus administration of intravenous contrast. CONTRAST:  25m OMNIPAQUE IOHEXOL 300 MG/ML SOLN, 1041mISOVUE-300 IOPAMIDOL (ISOVUE-300) INJECTION 61%, <See Chart> ISOVUE-300 IOPAMIDOL (ISOVUE-300) INJECTION 61% COMPARISON:  CT abdomen and pelvis, 02/28/2017 FINDINGS: CT CHEST FINDINGS Cardiovascular: Heart is normal in size and configuration. No pericardial effusion. No coronary artery calcifications. Great vessels are normal in caliber. No aortic dissection or atherosclerosis. Mediastinum/Nodes: Right PICC line tip projects just above the caval atrial junction. No neck base, axillary, mediastinal or hilar masses or adenopathy. Trachea and esophagus are unremarkable. Specifically, no esophageal wall thickening. No extraluminal contrast or air. No findings to suggest esophageal inflammation or perforation. Lungs/Pleura: Lungs essentially clear. No pleural effusion. No pneumothorax. Musculoskeletal: No fracture or acute finding. No osteoblastic or osteolytic lesions. Mild disc degenerative changes along the midthoracic spine. CT ABDOMEN FINDINGS Hepatobiliary: Liver normal in size and overall attenuation. Small amount of focal fat adjacent to the falciform ligament. Subtle, 3.2 cm area of hypoattenuation is noted in the right lobe on the initial post-contrast image not seen on the delayed images. This may also reflect focal fatty infiltration. There is no convincing mass. Gallbladder surgically absent. No  bile duct dilation. Pancreas: Unremarkable. No pancreatic ductal dilatation or surrounding inflammatory changes. Spleen: Normal in size without focal abnormality.  Adrenals/Urinary Tract: No adrenal masses. Kidneys normal size, orientation and position. Symmetric enhancement and excretion. No mass, stone or hydronephrosis. Visualized ureters are unremarkable. Stomach/Bowel: Status post gastric surgery, new since the prior CT. No stomach wall thickening or inflammation. Visualized small bowel and colon are normal in caliber. No wall thickening or inflammatory changes. Vascular/Lymphatic: No significant vascular findings are present. No enlarged abdominal or pelvic lymph nodes. Other: No ascites.  No hernia. Musculoskeletal: No fracture or acute finding. No osteoblastic or osteolytic lesions. IMPRESSION: 1. No acute findings within the chest or abdomen. No findings to account for epigastric chest pain. 2. No evidence of esophageal inflammation or injury. 3. Status post gastric stapling procedure. No stomach inflammation. No evidence of an operative complication. 4. Status post cholecystectomy. 5. Subtle area of hypoattenuation in the right liver lobe most likely focal fat. It is not visualized on the delayed sequence. There is no mass effect associated with this. Recommend further assessment and characterization with liver MRI with and without contrast. Electronically Signed   By: Lajean Manes M.D.   On: 08/10/2018 16:15   Ct Abdomen W Contrast Result Date: 08/10/2018 CLINICAL DATA:  Esophageal pain.  Unable to keep anything down. Pt c/o epigastric / chest pain for 3-4 days That has been gradually worsening. Pt states she had gastric sleeve surgery 07/15/18 and has been treated for dehydration because she hasn't been able to eat or drink (pt with PICC). Pt was given carafate and took yesterday but has not seen improvement. Spoke with Dr. Dema Severin [facility]) who advised pt to come to ed for CT. Study performed with oral contrast given immediately before imaging. EXAM: CT CHEST AND ABDOMEN WITH CONTRAST TECHNIQUE: Multidetector CT imaging of the chest, abdomen and pelvis  was performed following the standard protocol during bolus administration of intravenous contrast. CONTRAST:  85m OMNIPAQUE IOHEXOL 300 MG/ML SOLN, 1095mISOVUE-300 IOPAMIDOL (ISOVUE-300) INJECTION 61%, <See Chart> ISOVUE-300 IOPAMIDOL (ISOVUE-300) INJECTION 61% COMPARISON:  CT abdomen and pelvis, 02/28/2017 FINDINGS: CT CHEST FINDINGS Cardiovascular: Heart is normal in size and configuration. No pericardial effusion. No coronary artery calcifications. Great vessels are normal in caliber. No aortic dissection or atherosclerosis. Mediastinum/Nodes: Right PICC line tip projects just above the caval atrial junction. No neck base, axillary, mediastinal or hilar masses or adenopathy. Trachea and esophagus are unremarkable. Specifically, no esophageal wall thickening. No extraluminal contrast or air. No findings to suggest esophageal inflammation or perforation. Lungs/Pleura: Lungs essentially clear. No pleural effusion. No pneumothorax. Musculoskeletal: No fracture or acute finding. No osteoblastic or osteolytic lesions. Mild disc degenerative changes along the midthoracic spine. CT ABDOMEN FINDINGS Hepatobiliary: Liver normal in size and overall attenuation. Small amount of focal fat adjacent to the falciform ligament. Subtle, 3.2 cm area of hypoattenuation is noted in the right lobe on the initial post-contrast image not seen on the delayed images. This may also reflect focal fatty infiltration. There is no convincing mass. Gallbladder surgically absent. No bile duct dilation. Pancreas: Unremarkable. No pancreatic ductal dilatation or surrounding inflammatory changes. Spleen: Normal in size without focal abnormality. Adrenals/Urinary Tract: No adrenal masses. Kidneys normal size, orientation and position. Symmetric enhancement and excretion. No mass, stone or hydronephrosis. Visualized ureters are unremarkable. Stomach/Bowel: Status post gastric surgery, new since the prior CT. No stomach wall thickening or  inflammation. Visualized small bowel and colon are normal in caliber. No wall thickening or inflammatory  changes. Vascular/Lymphatic: No significant vascular findings are present. No enlarged abdominal or pelvic lymph nodes. Other: No ascites.  No hernia. Musculoskeletal: No fracture or acute finding. No osteoblastic or osteolytic lesions. IMPRESSION: 1. No acute findings within the chest or abdomen. No findings to account for epigastric chest pain. 2. No evidence of esophageal inflammation or injury. 3. Status post gastric stapling procedure. No stomach inflammation. No evidence of an operative complication. 4. Status post cholecystectomy. 5. Subtle area of hypoattenuation in the right liver lobe most likely focal fat. It is not visualized on the delayed sequence. There is no mass effect associated with this. Recommend further assessment and characterization with liver MRI with and without contrast. Electronically Signed   By: Lajean Manes M.D.   On: 08/10/2018 16:15   Dg Duanne Limerick  W/kub Result Date: 08/13/2018 CLINICAL DATA:  Post laparoscopic gastric sleeve EXAM: UPPER GI SERIES WITH KUB TECHNIQUE: After obtaining a scout radiograph a routine upper GI series was performed using thin barium FLUOROSCOPY TIME:  Fluoroscopy Time:  1 minutes 48 seconds Radiation Exposure Index (if provided by the fluoroscopic device): 301 mGy Number of Acquired Spot Images: 0 COMPARISON:  CT chest of 08/10/2018 FINDINGS: A preliminary film of the abdomen shows a nonspecific bowel gas pattern. Clips are present in the right upper quadrant from prior cholecystectomy and sutures are noted for gastric sleeve. A small opacity in the right abdomen potentially could represent renal calculus, but on the recent CT scan no renal calculus is evident and this opacity may represent material within the bowel. A single contrast study was performed. The swallowing mechanism is unremarkable. No hiatal hernia is seen, but the gastroesophageal junction  does appear to be rather patulous. Barium flows freely into the gastric sleeve with no obstruction. Barium flows freely through the gastric sleeve into the duodenum with no obstruction. The proximal small bowel that is visualized is unremarkable. There is mild gastroesophageal reflux demonstrated. A barium pill was given at the end the study which passed into the gastric sleeve without significant delay. IMPRESSION: 1. No abnormality of the gastric sleeve is seen. 2. Somewhat patulous gastroesophageal junction with mild reflux demonstrated with the water siphon maneuver. However, the barium pill passes into the gastric sleeve without significant delay. Electronically Signed   By: Ivar Drape M.D.   On: 08/13/2018 12:00     Dg Abd Acute W/chest Result Date: 08/16/2018 CLINICAL DATA:  Chronic abdominal pain, status post gastric sleeve 1 month ago, persistent nausea, vomiting and fever EXAM: DG ABDOMEN ACUTE W/ 1V CHEST COMPARISON:  08/10/2018, 08/13/2018 FINDINGS: Right upper extremity PICC line tip lower SVC level. Normal heart size and vascularity. Lungs remain clear. No focal pneumonia, collapse or consolidation. Negative for edema, effusion or pneumothorax. Trachea is midline. Lower cervical fusion hardware noted. No free air. Patient is status post gastric sleeve and previous cholecystectomy. Contrast throughout the nondilated colon from the previous upper GI series 3 days ago. No acute osseous finding. IMPRESSION: Negative for obstruction pattern or free air. No acute chest process Enteric contrast throughout the nondilated colon Electronically Signed   By: Jerilynn Mages.  Shick M.D.   On: 08/16/2018 09:09     0800: No fever while in the ED. BC and UC obtained. T/C returned from General Surgery Dr. Kieth Brightly, case discussed, including:  HPI, pertinent PM/SHx, VS/PE, dx testing, ED course and treatment:  Agreeable to come to ED for evaluation for admission.      Final Clinical Impressions(s) / ED Diagnoses  Final diagnoses:  None    ED Discharge Orders    None       Francine Graven, DO 08/17/18 [phone]

## 2018-08-16 NOTE — H&P (Signed)
Kaitlyn Johnson is an 37 y.o. female.   Chief Complaint: nausea and vomiting HPI: 37 yo female s/p sleeve gastrectomy with persistent nausea and epigastric cramping pain. She received fluids yesterday then had a fever of 100.5 yesterday. The pain did not subside and the fever did not subside either so she came to the ER. She has had a CT and UGI in the last week without major focal abnormality.  Past Medical History:  Diagnosis Date  . Anemia 11/26/2011  . Anxiety   . Asthma   . Celiac and mesenteric artery injury   . Cushing's syndrome ([place])    06/21/16- "in remission"  . Depression   . H/O hiatal hernia   . History of kidney stones   . POTS (postural orthostatic tachycardia syndrome)   . Shortness of breath dyspnea    with exertertion  . Sphincter of Oddi dysfunction   . Von Willebrand disease ([place])    bruising easy    Past Surgical History:  Procedure Laterality Date  . ABDOMINAL HYSTERECTOMY  08/2010  . ANTERIOR CERVICAL DECOMP/DISCECTOMY FUSION N/A 03/31/2013   Procedure: ANTERIOR CERVICAL DECOMPRESSION/DISCECTOMY FUSION 1 LEVEL Cervical five-six;  Surgeon: Faythe Ghee, MD;  Location: [facility];  Service: Neurosurgery;  Laterality: N/A;  . APPENDECTOMY    . Cholangio-Pancreatography with Spintectorotomy + Stent  07/16/2012   01/15/14  . CHOLECYSTECTOMY    . LAPAROSCOPIC APPENDECTOMY N/A 02/28/2017   Procedure: APPENDECTOMY LAPAROSCOPIC;  Surgeon: Erroll Luna, MD;  Location: [facility];  Service: General;  Laterality: N/A;  . LAPAROSCOPIC ENDOMETRIOSIS FULGURATION    . LAPAROSCOPIC GASTRIC SLEEVE RESECTION N/A 07/15/2018   Procedure: LAPAROSCOPIC GASTRIC SLEEVE RESECTION, UPPER ENDO, ERAS Pathway;  Surgeon: Johnathan Hausen, MD;  Location: [facility];  Service: General;  Laterality: N/A;  . LUMBAR LAMINECTOMY    . LUMBAR LAMINECTOMY/DECOMPRESSION MICRODISCECTOMY Left 06/22/2016   Procedure: MICRODISCECTOMY LEFT LUMBAR FOUR-FIVE;  Surgeon: Consuella Lose, MD;  Location:  [facility];  Service: Neurosurgery;  Laterality: Left;  . ROUX-EN-Y PROCEDURE  04/1999    Family History  Problem Relation Age of Onset  . Hypertension Mother    Social History:  reports that she has never smoked. She has never used smokeless tobacco. She reports that she drinks about 5.0 standard drinks of alcohol per week. She reports that she does not use drugs.  Allergies:  Allergies  Allergen Reactions  . Bee Venom Anaphylaxis  . Reglan [Metoclopramide] Other (See Comments)    Reaction:  Oculogyric crisis   . Shellfish Allergy Anaphylaxis  . Wheat Bran Other (See Comments)    Pt has celiac disease.    . Adhesive [Tape] Hives  . Gluten Meal Other (See Comments)    Pt has celiac disease.   . Sulfa Antibiotics Rash  . Tartrazine Rash  . Yellow Dye Rash     (Not in a hospital admission)  Results for orders placed or performed during the hospital encounter of 08/16/18 (from the past 48 hour(s))  Lipase, blood     Status: None   Collection Time: 08/16/18  3:25 AM  Result Value Ref Range   Lipase 33 11 - 51 U/L    Comment: Performed at [facility], [place] [address], [place], [place] [address]  Comprehensive metabolic panel     Status: Abnormal   Collection Time: 08/16/18  3:25 AM  Result Value Ref Range   Sodium 137 135 - 145 mmol/L   Potassium 3.5 3.5 - 5.1 mmol/L  Chloride 105 98 - 111 mmol/L   CO2 22 22 - 32 mmol/L   Glucose, Bld 113 (H) 70 - 99 mg/dL   BUN 5 (L) 6 - 20 mg/dL   Creatinine, Ser 0.67 0.44 - 1.00 mg/dL   Calcium 8.6 (L) 8.9 - 10.3 mg/dL   Total Protein 7.3 6.5 - 8.1 g/dL   Albumin 4.1 3.5 - 5.0 g/dL   AST 24 15 - 41 U/L   ALT 26 0 - 44 U/L   Alkaline Phosphatase 64 38 - 126 U/L   Total Bilirubin 0.5 0.3 - 1.2 mg/dL   GFR calc non Af Amer >60 >60 mL/min   GFR calc Af Amer >60 >60 mL/min    Comment: (NOTE) The eGFR has been calculated using the CKD EPI equation. This calculation has not been validated in all clinical  situations. eGFR's persistently <60 mL/min signify possible Chronic Kidney Disease.    Anion gap 10 5 - 15    Comment: Performed at [facility], [place] [address], [place], [place] [address]  CBC     Status: None   Collection Time: 08/16/18  3:25 AM  Result Value Ref Range   WBC 4.5 4.0 - 10.5 K/uL   RBC 4.30 3.87 - 5.11 MIL/uL   Hemoglobin 13.1 12.0 - 15.0 g/dL   HCT 40.7 36.0 - 46.0 %   MCV 94.7 80.0 - 100.0 fL   MCH 30.5 26.0 - 34.0 pg   MCHC 32.2 30.0 - 36.0 g/dL   RDW 13.0 11.5 - 15.5 %   Platelets 182 150 - 400 K/uL   nRBC 0.0 0.0 - 0.2 %    Comment: Performed at [facility], [place] [address] [place], [place] [address]  I-Stat CG4 Lactic Acid, ED     Status: None   Collection Time: 08/16/18  3:34 AM  Result Value Ref Range   Lactic Acid, Venous 1.04 0.5 - 1.9 mmol/L  Urinalysis, Routine w reflex microscopic     Status: Abnormal   Collection Time: 08/16/18  3:54 AM  Result Value Ref Range   Color, Urine YELLOW YELLOW   APPearance CLEAR CLEAR   Specific Gravity, Urine 1.030 1.005 - 1.030   pH 5.0 5.0 - 8.0   Glucose, UA NEGATIVE NEGATIVE mg/dL   Hgb urine dipstick NEGATIVE NEGATIVE   Bilirubin Urine NEGATIVE NEGATIVE   Ketones, ur 5 (A) NEGATIVE mg/dL   Protein, ur NEGATIVE NEGATIVE mg/dL   Nitrite NEGATIVE NEGATIVE   Leukocytes, UA NEGATIVE NEGATIVE    Comment: Performed at [place] [address], [place], [place] [address]   Dg Abd Acute W/chest  Result Date: 08/16/2018 CLINICAL DATA:  Chronic abdominal pain, status post gastric sleeve 1 month ago, persistent nausea, vomiting and fever EXAM: DG ABDOMEN ACUTE W/ 1V CHEST COMPARISON:  08/10/2018, 08/13/2018 FINDINGS: Right upper extremity PICC line tip lower SVC level. Normal heart size and vascularity. Lungs remain clear. No focal pneumonia, collapse or consolidation. Negative for edema, effusion or pneumothorax. Trachea is midline. Lower cervical fusion  hardware noted. No free air. Patient is status post gastric sleeve and previous cholecystectomy. Contrast throughout the nondilated colon from the previous upper GI series 3 days ago. No acute osseous finding. IMPRESSION: Negative for obstruction pattern or free air. No acute chest process Enteric contrast throughout the nondilated colon Electronically Signed   By: Jerilynn Mages.  Shick M.D.   On: 08/16/2018 09:09    Review of Systems  Constitutional: Negative for  chills and fever.  HENT: Negative for hearing loss.   Eyes: Negative for blurred vision and double vision.  Respiratory: Negative for cough and hemoptysis.   Cardiovascular: Negative for chest pain and palpitations.  Gastrointestinal: Positive for abdominal pain and nausea. Negative for vomiting.  Genitourinary: Negative for dysuria and urgency.  Musculoskeletal: Negative for myalgias and neck pain.  Skin: Negative for itching and rash.  Neurological: Negative for dizziness, tingling and headaches.  Endo/Heme/Allergies: Does not bruise/bleed easily.  Psychiatric/Behavioral: Negative for depression and suicidal ideas.    Blood pressure 101/65, pulse 88, temperature 99 F (37.2 C), temperature source Oral, resp. rate 13, height 5' 8" (1.727 m), weight 110.7 kg, SpO2 97 %. Physical Exam  Vitals reviewed. Constitutional: She is oriented to person, place, and time. She appears well-developed and well-nourished.  HENT:  Head: Normocephalic and atraumatic.  Eyes: Pupils are equal, round, and reactive to light. Conjunctivae and EOM are normal.  Neck: Normal range of motion. Neck supple.  Cardiovascular: Normal rate and regular rhythm.  Respiratory: Effort normal and breath sounds normal.  GI: Soft. Bowel sounds are normal. She exhibits no distension. There is no tenderness.  Incisions well healed  Musculoskeletal: Normal range of motion.  Neurological: She is alert and oriented to person, place, and time.  Skin: Skin is warm and dry.   Psychiatric: She has a normal mood and affect. Her behavior is normal.     Assessment/Plan 37 yo female s/p sleeve gastrectomy with persistent nausea and dehydration -admit to hospital -IV fluids -bari clears and shakes as tol -ambulate -antiemetics and pain medications  Mickeal Skinner, MD 08/16/2018, 10:59 AM

## 2018-08-16 NOTE — ED Notes (Signed)
Surgery in room.

## 2018-08-17 LAB — COMPREHENSIVE METABOLIC PANEL
ALT: 23 U/L (ref 0–44)
AST: 23 U/L (ref 15–41)
Albumin: 3.3 g/dL — ABNORMAL LOW (ref 3.5–5.0)
Alkaline Phosphatase: 55 U/L (ref 38–126)
Anion gap: 7 (ref 5–15)
CHLORIDE: 106 mmol/L (ref 98–111)
CO2: 25 mmol/L (ref 22–32)
CREATININE: 0.66 mg/dL (ref 0.44–1.00)
Calcium: 8.1 mg/dL — ABNORMAL LOW (ref 8.9–10.3)
GFR calc Af Amer: >60 mL/min (ref >60)
GLUCOSE: 139 mg/dL — AB (ref 70–99)
POTASSIUM: 3.1 mmol/L — AB (ref 3.5–5.1)
Sodium: 138 mmol/L (ref 135–145)
Total Bilirubin: 0.4 mg/dL (ref 0.3–1.2)
Total Protein: 6.4 g/dL — ABNORMAL LOW (ref 6.5–8.1)

## 2018-08-17 LAB — BLOOD CULTURE ID PANEL (REFLEXED)
Acinetobacter baumannii: NOT DETECTED
CANDIDA KRUSEI: NOT DETECTED
CANDIDA PARAPSILOSIS: NOT DETECTED
CANDIDA TROPICALIS: NOT DETECTED
CARBAPENEM RESISTANCE: NOT DETECTED
Candida albicans: NOT DETECTED
Candida glabrata: NOT DETECTED
ENTEROCOCCUS SPECIES: NOT DETECTED
Enterobacter cloacae complex: NOT DETECTED
Enterobacteriaceae species: DETECTED — AB
Escherichia coli: NOT DETECTED
Haemophilus influenzae: NOT DETECTED
Klebsiella oxytoca: NOT DETECTED
Klebsiella pneumoniae: DETECTED — AB
LISTERIA MONOCYTOGENES: NOT DETECTED
NEISSERIA MENINGITIDIS: NOT DETECTED
Proteus species: NOT DETECTED
Pseudomonas aeruginosa: NOT DETECTED
SERRATIA MARCESCENS: NOT DETECTED
STAPHYLOCOCCUS AUREUS BCID: NOT DETECTED
STAPHYLOCOCCUS SPECIES: NOT DETECTED
Streptococcus agalactiae: NOT DETECTED
Streptococcus pneumoniae: NOT DETECTED
Streptococcus pyogenes: NOT DETECTED
Streptococcus species: NOT DETECTED

## 2018-08-17 LAB — CBC
HEMATOCRIT: 37 % (ref 36.0–46.0)
HEMOGLOBIN: 11.7 g/dL — AB (ref 12.0–15.0)
MCH: 30.1 pg (ref 26.0–34.0)
MCHC: 31.6 g/dL (ref 30.0–36.0)
MCV: 95.1 fL (ref 80.0–100.0)
NRBC: 0 % (ref 0.0–0.2)
Platelets: 121 10*3/uL — ABNORMAL LOW (ref 150–400)
RBC: 3.89 MIL/uL (ref 3.87–5.11)
RDW: 13.2 % (ref 11.5–15.5)
WBC: 2.6 10*3/uL — ABNORMAL LOW (ref 4.0–10.5)

## 2018-08-17 LAB — URINE CULTURE: Culture: <10000 — AB

## 2018-08-17 MED ORDER — SODIUM CHLORIDE 0.9 % IV SOLN
2.0000 g | INTRAVENOUS | Status: DC
  Administered 2018-08-17 – 2018-08-25 (×9): 2 g via INTRAVENOUS
  Filled 2018-08-17 (×9): qty 2

## 2018-08-17 MED ORDER — SODIUM CHLORIDE 0.9% FLUSH: 10.0000 mL | INTRAVENOUS | Status: DC | PRN

## 2018-08-17 MED ORDER — POTASSIUM CHLORIDE 10 MEQ/100ML IV SOLN
10.0000 meq | INTRAVENOUS | Status: AC
  Administered 2018-08-17 (×2): 10 meq via INTRAVENOUS
  Filled 2018-08-17 (×2): qty 100

## 2018-08-17 MED ORDER — PREMIER PROTEIN SHAKE
2.0000 [oz_av] | ORAL | Status: DC
  Administered 2018-08-17 – 2018-08-22 (×5): 2 [oz_av] via ORAL

## 2018-08-17 NOTE — Progress Notes (Signed)
PHARMACY - PHYSICIAN COMMUNICATION CRITICAL VALUE ALERT - BLOOD CULTURE IDENTIFICATION (BCID)  Kaitlyn Johnson is an 37 y.o. female who presented to [facility] on 08/16/2018 with a chief complaint of nausea/dehydration, s/p sleeve gastrectomy 9/17.  Assessment:  2 of 4 bottles with Klebsiella pneumonia, no Carbapenem resistance detected  Name of physician (or Provider) Contacted: Dr. Sheliah Hatch  Current antibiotics: none  Begin Rocephin 2gm IV q24   Results for orders placed or performed during the hospital encounter of 08/16/18  Blood Culture ID Panel (Reflexed) (Collected: 08/16/2018  8:12 AM)  Result Value Ref Range   Enterococcus species NOT DETECTED NOT DETECTED   Listeria monocytogenes NOT DETECTED NOT DETECTED   Staphylococcus species NOT DETECTED NOT DETECTED   Staphylococcus aureus (BCID) NOT DETECTED NOT DETECTED   Streptococcus species NOT DETECTED NOT DETECTED   Streptococcus agalactiae NOT DETECTED NOT DETECTED   Streptococcus pneumoniae NOT DETECTED NOT DETECTED   Streptococcus pyogenes NOT DETECTED NOT DETECTED   Acinetobacter baumannii NOT DETECTED NOT DETECTED   Enterobacteriaceae species DETECTED (A) NOT DETECTED   Enterobacter cloacae complex NOT DETECTED NOT DETECTED   Escherichia coli NOT DETECTED NOT DETECTED   Klebsiella oxytoca NOT DETECTED NOT DETECTED   Klebsiella pneumoniae DETECTED (A) NOT DETECTED   Proteus species NOT DETECTED NOT DETECTED   Serratia marcescens NOT DETECTED NOT DETECTED   Carbapenem resistance NOT DETECTED NOT DETECTED   Haemophilus influenzae NOT DETECTED NOT DETECTED   Neisseria meningitidis NOT DETECTED NOT DETECTED   Pseudomonas aeruginosa NOT DETECTED NOT DETECTED   Candida albicans NOT DETECTED NOT DETECTED   Candida glabrata NOT DETECTED NOT DETECTED   Candida krusei NOT DETECTED NOT DETECTED   Candida parapsilosis NOT DETECTED NOT DETECTED   Candida tropicalis NOT DETECTED NOT DETECTED    Chilton Si, Satoshi Kalas  L 08/17/2018  7:43 AM

## 2018-08-17 NOTE — Progress Notes (Signed)
I have reviewed and concur with this student nurse's documentation.  

## 2018-08-17 NOTE — Progress Notes (Addendum)
  Progress Note: General Surgery Service   Assessment/Plan: Active Problems:   Dehydration 37 yo female s/p sleeve gastrectomy with persistent nausea and dehydration. She complains of epigastric pain, had diarrhea 1x overnight and is shivering with low grade fevers this morning. leukopenia -stool studies if diarrhea again -continue IV fluids -continue antiemetics -continue liquids with protein shakes as tolerated    LOS: 1 day  Chief Complaint/Subjective: Shivering overnight with feeling feverish, continued epigastric pain, diarrhea 1x overnight  Objective: Vital signs in last 24 hours: Temp:  [98.2 F (36.8 C)-100.2 F (37.9 C)] 99.2 F (37.3 C) (10/20 0535) Pulse Rate:  [84-110] 84 (10/20 0535) Resp:  [12-27] 12 (10/20 0535) BP: (100-145)/(63-91) 110/64 (10/20 0535) SpO2:  [95 %-100 %] 96 % (10/20 0535) Last BM Date: 08/13/18  Intake/Output from previous day: 10/19 0701 - 10/20 0700 In: 2650.8 [P.O.:120; I.V.:2430.8; IV Piggyback:100] Out: 2550 [Urine:2550] Intake/Output this shift: No intake/output data recorded.  Lungs: nonlabored breathing  Cardiovascular: tachycardic  Abd: soft, incisions c/d/i, no guarding, tender in epigastrium  Extremities: no edema  Neuro: AOx4  Lab Results: CBC  Recent Labs    08/16/18 0325 08/17/18 0438  WBC 4.5 2.6*  HGB 13.1 11.7*  HCT 40.7 37.0  PLT 182 121*   BMET Recent Labs    08/16/18 0325 08/17/18 0438  NA 137 138  K 3.5 3.1*  CL 105 106  CO2 22 25  GLUCOSE 113* 139*  BUN 5* <5*  CREATININE 0.67 0.66  CALCIUM 8.6* 8.1*   PT/INR No results for input(s): LABPROT, INR in the last 72 hours. ABG No results for input(s): PHART, HCO3 in the last 72 hours.  Invalid input(s): PCO2, PO2  Studies/Results:  Anti-infectives: Anti-infectives (From admission, onward)   None      Medications: Scheduled Meds: . enoxaparin (LOVENOX) injection  40 mg Subcutaneous Q24H  . metoprolol succinate  25 mg Oral Daily    . metoprolol succinate  75 mg Oral BID  . protein supplement shake  2 oz Oral Q4H   Continuous Infusions: . sodium chloride Stopped (08/16/18 1131)  . dextrose 5 % and 0.45% NaCl 125 mL/hr at 08/17/18 0600  . famotidine (PEPCID) IV Stopped (08/16/18 2133)  . potassium chloride 10 mEq (08/17/18 0623)   PRN Meds:.acetaminophen **OR** acetaminophen, diphenhydrAMINE **OR** diphenhydrAMINE, metoprolol tartrate, ondansetron, ondansetron **OR** ondansetron (ZOFRAN) IV, oxyCODONE, polyethylene glycol, promethazine, sodium chloride flush, traMADol  Mickeal Skinner, MD Pg# [phone] [facility]   Addendum 07:51 Blood cultures 2/4 positive for Klebsiella Pneumonia -start rocephin 2g today

## 2018-08-18 ENCOUNTER — Encounter (HOSPITAL_COMMUNITY): Payer: Self-pay

## 2018-08-18 ENCOUNTER — Inpatient Hospital Stay (HOSPITAL_COMMUNITY): Payer: BLUE CROSS/BLUE SHIELD

## 2018-08-18 DIAGNOSIS — R509 Fever, unspecified: Secondary | ICD-10-CM

## 2018-08-18 DIAGNOSIS — R112 Nausea with vomiting, unspecified: Secondary | ICD-10-CM

## 2018-08-18 DIAGNOSIS — B961 Klebsiella pneumoniae [K. pneumoniae] as the cause of diseases classified elsewhere: Secondary | ICD-10-CM

## 2018-08-18 DIAGNOSIS — R197 Diarrhea, unspecified: Secondary | ICD-10-CM

## 2018-08-18 DIAGNOSIS — Z9884 Bariatric surgery status: Secondary | ICD-10-CM

## 2018-08-18 DIAGNOSIS — R7881 Bacteremia: Secondary | ICD-10-CM

## 2018-08-18 DIAGNOSIS — D72819 Decreased white blood cell count, unspecified: Secondary | ICD-10-CM

## 2018-08-18 LAB — GASTROINTESTINAL PANEL BY PCR, STOOL (REPLACES STOOL CULTURE)

## 2018-08-18 LAB — COMPREHENSIVE METABOLIC PANEL
ALBUMIN: 3.3 g/dL — AB (ref 3.5–5.0)
ALT: 27 U/L (ref 0–44)
AST: 24 U/L (ref 15–41)
Alkaline Phosphatase: 52 U/L (ref 38–126)
Anion gap: 7 (ref 5–15)
CO2: 24 mmol/L (ref 22–32)
Calcium: 8.3 mg/dL — ABNORMAL LOW (ref 8.9–10.3)
Chloride: 107 mmol/L (ref 98–111)
Creatinine, Ser: 0.64 mg/dL (ref 0.44–1.00)
GFR calc Af Amer: >60 mL/min (ref >60)
Glucose, Bld: 129 mg/dL — ABNORMAL HIGH (ref 70–99)
POTASSIUM: 3.2 mmol/L — AB (ref 3.5–5.1)
Sodium: 138 mmol/L (ref 135–145)
Total Bilirubin: 0.3 mg/dL (ref 0.3–1.2)
Total Protein: 6.5 g/dL (ref 6.5–8.1)

## 2018-08-18 LAB — CBC
HCT: 37.8 % (ref 36.0–46.0)
Hemoglobin: 12.2 g/dL (ref 12.0–15.0)
MCH: 30.9 pg (ref 26.0–34.0)
MCHC: 32.3 g/dL (ref 30.0–36.0)
MCV: 95.7 fL (ref 80.0–100.0)
NRBC: 0 % (ref 0.0–0.2)
PLATELETS: 126 10*3/uL — AB (ref 150–400)
RBC: 3.95 MIL/uL (ref 3.87–5.11)
RDW: 13.1 % (ref 11.5–15.5)
WBC: 2.8 10*3/uL — AB (ref 4.0–10.5)

## 2018-08-18 MED ORDER — SODIUM CHLORIDE 0.9 % IJ SOLN
INTRAMUSCULAR | Status: AC
  Filled 2018-08-18: qty 50

## 2018-08-18 MED ORDER — HYDROMORPHONE HCL 1 MG/ML IJ SOLN
0.5000 mg | INTRAMUSCULAR | Status: DC | PRN
  Administered 2018-08-18 – 2018-08-20 (×9): 1 mg via INTRAVENOUS
  Filled 2018-08-18 (×9): qty 1

## 2018-08-18 MED ORDER — DULOXETINE HCL 30 MG PO CPEP
30.0000 mg | ORAL_CAPSULE | Freq: Every day | ORAL | Status: DC
  Administered 2018-08-18 – 2018-08-25 (×8): 30 mg via ORAL
  Filled 2018-08-18 (×8): qty 1

## 2018-08-18 MED ORDER — IOHEXOL 300 MG/ML  SOLN
30.0000 mL | Freq: Once | INTRAMUSCULAR | Status: AC | PRN
  Administered 2018-08-18: 30 mL via ORAL

## 2018-08-18 MED ORDER — ZOLPIDEM TARTRATE 5 MG PO TABS
5.0000 mg | ORAL_TABLET | Freq: Every day | ORAL | Status: DC
  Administered 2018-08-18: 5 mg via ORAL
  Filled 2018-08-18 (×2): qty 1

## 2018-08-18 MED ORDER — IOHEXOL 300 MG/ML  SOLN
100.0000 mL | Freq: Once | INTRAMUSCULAR | Status: AC | PRN
  Administered 2018-08-18: 100 mL via INTRAVENOUS

## 2018-08-18 MED ORDER — LURASIDONE HCL 60 MG PO TABS
60.0000 mg | ORAL_TABLET | Freq: Every day | ORAL | Status: DC
  Administered 2018-08-18: 60 mg via ORAL
  Filled 2018-08-18 (×2): qty 1

## 2018-08-18 NOTE — Progress Notes (Signed)
[facility] Progress Note     Subjective: CC-  Patient states that she feels about the same. Continues to have intermittent epigastric abdominal pain, nausea, and vomiting. She had 1 loose stool yesterday. Sipping on liquids makes her nauseated but does not make her abdominal pain worse. TMAX 101.9. WBC 2.8.  Blood cultures yesterday came back positive for Klebsiella pneumoniae and Enterobacteriaceae; she was started on IV rocephin.  PICC line placed 10/10, removed over the weekend.  Objective: Vital signs in last 24 hours: Temp:  [98.6 F (37 C)-101.9 F (38.8 C)] 98.8 F (37.1 C) (10/21 0646) Pulse Rate:  [71-96] 71 (10/21 0646) Resp:  [16-18] 16 (10/21 0646) BP: (94-141)/(51-86) 119/77 (10/21 0646) SpO2:  [96 %-100 %] 98 % (10/21 0646) Last BM Date: 08/13/18  Intake/Output from previous day: 10/20 0701 - 10/21 0700 In: 2223 [P.O.:122; I.V.:1810.6; IV Piggyback:290.4] Out: 3025 [Urine:3025] Intake/Output this shift: Total I/O In: 120 [P.O.:120] Out: 200 [Urine:200]  PE: Gen:  Alert, NAD, pleasant HEENT: EOM's intact, pupils equal and round Card:  RRR Pulm:  CTAB, no W/R/R, effort normal Abd: Soft, mild TTP epigastric region, +BS, lap incisions cdi Ext:  Calves soft and nontender Psych: A&Ox3  Skin: no rashes noted, warm and dry  Lab Results:  Recent Labs    08/17/18 0438 08/18/18 0425  WBC 2.6* 2.8*  HGB 11.7* 12.2  HCT 37.0 37.8  PLT 121* 126*   BMET Recent Labs    08/17/18 0438 08/18/18 0425  NA 138 138  K 3.1* 3.2*  CL 106 107  CO2 25 24  GLUCOSE 139* 129*  BUN <5* <5*  CREATININE 0.66 0.64  CALCIUM 8.1* 8.3*   PT/INR No results for input(s): LABPROT, INR in the last 72 hours. CMP     Component Value Date/Time   NA 138 08/18/2018 0425   NA 138 09/07/2013 1409   K 3.2 (L) 08/18/2018 0425   K 3.7 09/07/2013 1409   CL 107 08/18/2018 0425   CL 102 11/11/2012 1324   CO2 24 08/18/2018 0425   CO2 23 09/07/2013 1409   GLUCOSE  129 (H) 08/18/2018 0425   GLUCOSE 108 09/07/2013 1409   GLUCOSE 85 11/11/2012 1324   BUN <5 (L) 08/18/2018 0425   BUN 12.4 09/07/2013 1409   CREATININE 0.64 08/18/2018 0425   CREATININE 0.8 09/07/2013 1409   CALCIUM 8.3 (L) 08/18/2018 0425   CALCIUM 9.4 09/07/2013 1409   PROT 6.5 08/18/2018 0425   PROT 7.1 09/07/2013 1409   ALBUMIN 3.3 (L) 08/18/2018 0425   ALBUMIN 3.6 09/07/2013 1409   AST 24 08/18/2018 0425   AST 14 09/07/2013 1409   ALT 27 08/18/2018 0425   ALT 28 09/07/2013 1409   ALKPHOS 52 08/18/2018 0425   ALKPHOS 81 09/07/2013 1409   BILITOT 0.3 08/18/2018 0425   BILITOT <0.20 09/07/2013 1409   GFRNONAA >60 08/18/2018 0425   GFRAA >60 08/18/2018 0425   Lipase     Component Value Date/Time   LIPASE 33 08/16/2018 0325       Studies/Results: No results found.  Anti-infectives: Anti-infectives (From admission, onward)   Start     Dose/Rate Route Frequency Ordered Stop   08/17/18 0800  cefTRIAXone (ROCEPHIN) 2 g in sodium chloride 0.9 % 100 mL IVPB     2 g 200 mL/hr over 30 Minutes Intravenous Every 24 hours 08/17/18 0753         Assessment/Plan Abdominal pain, nausea/vomiting, and dehydration S/p sleeve gastrectomy 07/15/18 Dr. Hassell Done -  Patient seen and examined with Dr. Hassell Done. Will repeat CT scan abdomen/pelvis with oral contrast today. Continue bariatric clears as tolerated and IVF rehydration. May need upper endoscopy. Repeat labs in AM.  Bacteremia  - blood cx positive for klebsiella pneumoniae and Enterobacteriaceae - started rocephin 10/20. Will ask ID to see - GI panel pending  ID - rocephin 10/20 FEN - IVF, bariatric clears VTE - SCDs, lovenox Foley - none   LOS: 2 days    Wellington [place] , [facility] 08/18/2018, 9:33 AM Pager: [phone] Mon 7:00 am -11:30 AM Tues-Fri 7:00 am-4:30 pm Sat-Sun 7:00 am-11:30 am

## 2018-08-18 NOTE — Consult Note (Signed)
[facility] for Infectious Disease    Date of Admission:  08/16/2018   Total days of antibiotics: 2 ceftriaxone               Reason for Consult: Bacteremia    Referring Provider: Gillian Shields   Assessment: Kleb pneumo bacteremia  PIC line (removed) Gastric sleeve surgery Leukopenia  Plan: 1. Agree with ceftriaxone 2. Await repeat CT 3. Will repeat her BCx  Comment-  Suspect she had bacteremia related to [facility] given her negative CT lat week.  Will narrow her anbx as we get sensi. Difficulty is she may need another [facility] given her issues with PO intake at this point?  Thank you so much for this interesting consult,  Active Problems:   Dehydration   . DULoxetine  30 mg Oral Daily  . enoxaparin (LOVENOX) injection  40 mg Subcutaneous Q24H  . Lurasidone HCl  60 mg Oral Q supper  . metoprolol succinate  25 mg Oral Daily  . metoprolol succinate  75 mg Oral BID  . protein supplement shake  2 oz Oral Q4H  . zolpidem  5 mg Oral QHS    HPI: Kaitlyn Johnson is a 37 y.o. 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 She has had decreased po intake, n/v, since her surgery. She was followed in the hydration clinic and had PIC line placed 10-10. She was seen in ED on 10-13 and had CT scan which did not show acute abnormality.  On 10-19 she had temp 100.5 and came to ED. WBC 4.5 Her BCx have since grown K pneumo in 2/2.  She had no problems with her PIC at home (tenderenss, erythema, d/c). Her PIC was removed 10-20.   Review of Systems: Review of Systems  Constitutional: Positive for chills, fever and weight loss.  Gastrointestinal: Positive for abdominal pain, diarrhea, nausea and vomiting.  Genitourinary: Negative for dysuria.  Please see HPI. All other systems reviewed and negative.   Past Medical History:  Diagnosis Date  . Anemia 11/26/2011  . Anxiety   . Asthma   . Celiac and mesenteric artery  injury   . Cushing's syndrome ([place])    06/21/16- "in remission"  . Depression   . H/O hiatal hernia   . History of kidney stones   . POTS (postural orthostatic tachycardia syndrome)   . Shortness of breath dyspnea    with exertertion  . Sphincter of Oddi dysfunction   . Von Willebrand disease ([place])    bruising easy    Social History   Tobacco Use  . Smoking status: Never Smoker  . Smokeless tobacco: Never Used  Substance Use Topics  . Alcohol use: Yes    Alcohol/week: 5.0 standard drinks    Types: 5 Glasses of wine per week  . Drug use: No    Family History  Problem Relation Age of Onset  . Hypertension Mother      Medications:  Scheduled: . DULoxetine  30 mg Oral Daily  . enoxaparin (LOVENOX) injection  40 mg Subcutaneous Q24H  . Lurasidone HCl  60 mg Oral Q supper  . metoprolol succinate  25 mg Oral Daily  . metoprolol succinate  75 mg Oral BID  . protein supplement shake  2 oz Oral Q4H  . zolpidem  5 mg Oral QHS    Abtx:  Anti-infectives (From admission, onward)   Start     Dose/Rate Route  Frequency Ordered Stop   08/17/18 0800  cefTRIAXone (ROCEPHIN) 2 g in sodium chloride 0.9 % 100 mL IVPB     2 g 200 mL/hr over 30 Minutes Intravenous Every 24 hours 08/17/18 0753          OBJECTIVE: Blood pressure 119/77, pulse 71, temperature 98.8 F (37.1 C), temperature source Oral, resp. rate 16, height '5\' 8"'$  (1.727 m), weight 110.7 kg, SpO2 98 %.  Physical Exam  Constitutional: She appears well-developed and well-nourished.  Non-toxic appearance. She does not appear ill.  HENT:  Head: Normocephalic and atraumatic.  Mouth/Throat: No oropharyngeal exudate.  Eyes: Pupils are equal, round, and reactive to light. EOM are normal.  Neck: Normal range of motion. Neck supple.  Cardiovascular: Normal rate, regular rhythm and normal heart sounds.  Pulmonary/Chest: Effort normal and breath sounds normal.  Abdominal: Soft. Bowel sounds are normal. She exhibits no  distension. There is no tenderness. There is no guarding.  Musculoskeletal: She exhibits no edema.       Arms: Lymphadenopathy:    She has no cervical adenopathy.    Lab Results Results for orders placed or performed during the hospital encounter of 08/16/18 (from the past 48 hour(s))  Comprehensive metabolic panel     Status: Abnormal   Collection Time: 08/17/18  4:38 AM  Result Value Ref Range   Sodium 138 135 - 145 mmol/L   Potassium 3.1 (L) 3.5 - 5.1 mmol/L   Chloride 106 98 - 111 mmol/L   CO2 25 22 - 32 mmol/L   Glucose, Bld 139 (H) 70 - 99 mg/dL   BUN <5 (L) 6 - 20 mg/dL   Creatinine, Ser 0.66 0.44 - 1.00 mg/dL   Calcium 8.1 (L) 8.9 - 10.3 mg/dL   Total Protein 6.4 (L) 6.5 - 8.1 g/dL   Albumin 3.3 (L) 3.5 - 5.0 g/dL   AST 23 15 - 41 U/L   ALT 23 0 - 44 U/L   Alkaline Phosphatase 55 38 - 126 U/L   Total Bilirubin 0.4 0.3 - 1.2 mg/dL   GFR calc non Af Amer >60 >60 mL/min   GFR calc Af Amer >60 >60 mL/min    Comment: (NOTE) The eGFR has been calculated using the CKD EPI equation. This calculation has not been validated in all clinical situations. eGFR's persistently <60 mL/min signify possible Chronic Kidney Disease.    Anion gap 7 5 - 15    Comment: Performed at [facility], [place] [address], [place], [place] [address]  CBC     Status: Abnormal   Collection Time: 08/17/18  4:38 AM  Result Value Ref Range   WBC 2.6 (L) 4.0 - 10.5 K/uL   RBC 3.89 3.87 - 5.11 MIL/uL   Hemoglobin 11.7 (L) 12.0 - 15.0 g/dL   HCT 37.0 36.0 - 46.0 %   MCV 95.1 80.0 - 100.0 fL   MCH 30.1 26.0 - 34.0 pg   MCHC 31.6 30.0 - 36.0 g/dL   RDW 13.2 11.5 - 15.5 %   Platelets 121 (L) 150 - 400 K/uL    Comment: REPEATED TO VERIFY SPECIMEN CHECKED FOR CLOTS    nRBC 0.0 0.0 - 0.2 %    Comment: Performed at [facility], [place] [address] [place],  [address]  Gastrointestinal Panel by PCR , Stool     Status: None   Collection Time: 08/17/18  7:00 AM    Result Value Ref Range   Campylobacter species NOT DETECTED NOT  DETECTED   Plesimonas shigelloides NOT DETECTED NOT DETECTED   Salmonella species NOT DETECTED NOT DETECTED   Yersinia enterocolitica NOT DETECTED NOT DETECTED   Vibrio species NOT DETECTED NOT DETECTED   Vibrio cholerae NOT DETECTED NOT DETECTED   Enteroaggregative E coli (EAEC) NOT DETECTED NOT DETECTED   Enteropathogenic E coli (EPEC) NOT DETECTED NOT DETECTED   Enterotoxigenic E coli (ETEC) NOT DETECTED NOT DETECTED   Shiga like toxin producing E coli (STEC) NOT DETECTED NOT DETECTED   Shigella/Enteroinvasive E coli (EIEC) NOT DETECTED NOT DETECTED   Cryptosporidium NOT DETECTED NOT DETECTED   Cyclospora cayetanensis NOT DETECTED NOT DETECTED   Entamoeba histolytica NOT DETECTED NOT DETECTED   Giardia lamblia NOT DETECTED NOT DETECTED   Adenovirus F40/41 NOT DETECTED NOT DETECTED   Astrovirus NOT DETECTED NOT DETECTED   Norovirus GI/GII NOT DETECTED NOT DETECTED   Rotavirus A NOT DETECTED NOT DETECTED   Sapovirus (I, II, IV, and V) NOT DETECTED NOT DETECTED    Comment: Performed at [facility], [place] [place], [place] [address]  Comprehensive metabolic panel     Status: Abnormal   Collection Time: 08/18/18  4:25 AM  Result Value Ref Range   Sodium 138 135 - 145 mmol/L   Potassium 3.2 (L) 3.5 - 5.1 mmol/L   Chloride 107 98 - 111 mmol/L   CO2 24 22 - 32 mmol/L   Glucose, Bld 129 (H) 70 - 99 mg/dL   BUN <5 (L) 6 - 20 mg/dL   Creatinine, Ser 0.64 0.44 - 1.00 mg/dL   Calcium 8.3 (L) 8.9 - 10.3 mg/dL   Total Protein 6.5 6.5 - 8.1 g/dL   Albumin 3.3 (L) 3.5 - 5.0 g/dL   AST 24 15 - 41 U/L   ALT 27 0 - 44 U/L   Alkaline Phosphatase 52 38 - 126 U/L   Total Bilirubin 0.3 0.3 - 1.2 mg/dL   GFR calc non Af Amer >60 >60 mL/min   GFR calc Af Amer >60 >60 mL/min    Comment: (NOTE) The eGFR has been calculated using the CKD EPI equation. This calculation has not been validated in all clinical  situations. eGFR's persistently <60 mL/min signify possible Chronic Kidney Disease.    Anion gap 7 5 - 15    Comment: Performed at [facility], [place] [address] [place], [place] [address]  CBC     Status: Abnormal   Collection Time: 08/18/18  4:25 AM  Result Value Ref Range   WBC 2.8 (L) 4.0 - 10.5 K/uL   RBC 3.95 3.87 - 5.11 MIL/uL   Hemoglobin 12.2 12.0 - 15.0 g/dL   HCT 37.8 36.0 - 46.0 %   MCV 95.7 80.0 - 100.0 fL   MCH 30.9 26.0 - 34.0 pg   MCHC 32.3 30.0 - 36.0 g/dL   RDW 13.1 11.5 - 15.5 %   Platelets 126 (L) 150 - 400 K/uL   nRBC 0.0 0.0 - 0.2 %    Comment: Performed at [facility], [place] [address] [place], [place] [address]      Component Value Date/Time   SDES  08/16/2018 0109    BLOOD RIGHT ARM Performed at [facility], [place] [address], [place], [place] [address]    SDES  08/16/2018 [phone]    BLOOD LEFT HAND Performed at [facility], [place] [address], [place], [place] [address]    [place]  08/16/2018 7062    BOTTLES DRAWN  AEROBIC AND ANAEROBIC Blood Culture adequate volume Performed at [facility] [address] [place], [place] [address]    SPECREQUEST  08/16/2018 4782    BOTTLES DRAWN AEROBIC AND ANAEROBIC Blood Culture adequate volume Performed at [facility] [address], [place], [place] [address]    CULT  08/16/2018 3086    NO GROWTH 2 DAYS Performed at [facility], [place] [address] [place], [place] [address]    CULT (A) 08/16/2018 9629    KLEBSIELLA PNEUMONIAE SUSCEPTIBILITIES TO FOLLOW Performed at [facility], [place] [address], [place], [place] [address]    REPTSTATUS PENDING 08/16/2018 3244   REPTSTATUS PENDING 08/16/2018 0812   No results found. Recent Results (from the past 240 hour(s))  Urine culture     Status: Abnormal   Collection Time: 08/16/18  3:54 AM  Result Value Ref Range Status   Specimen  Description   Final    URINE, RANDOM Performed at [facility] [address] [place], [place] [address]    Special Requests   Final    NONE Performed at [facility], [place] [address], [place], [place] [address]    Culture (A)  Final    <10,000 COLONIES/mL INSIGNIFICANT GROWTH Performed at [facility] [address], [place], [place] [address]    Report Status 08/17/2018 FINAL  Final  Culture, blood (routine x 2)     Status: None (Preliminary result)   Collection Time: 08/16/18  8:12 AM  Result Value Ref Range Status   Specimen Description   Final    BLOOD RIGHT ARM Performed at [facility] [address], [place], [place] [address]    Special Requests   Final    BOTTLES DRAWN AEROBIC AND ANAEROBIC Blood Culture adequate volume Performed at [facility] [address] [place], [place] [address]    Culture   Final    NO GROWTH 2 DAYS Performed at [facility] [address], [place], [place] [address]    Report Status PENDING  Incomplete  Culture, blood (routine x 2)     Status: Abnormal (Preliminary result)   Collection Time: 08/16/18  8:12 AM  Result Value Ref Range Status   Specimen Description   Final    BLOOD LEFT HAND Performed at [facility] [address], [place], [place] [address]    Special Requests   Final    BOTTLES DRAWN AEROBIC AND ANAEROBIC Blood Culture adequate volume Performed at [facility] [address], [place], [place] [address]    Culture  Setup Time   Final    GRAM NEGATIVE RODS IN BOTH AEROBIC AND ANAEROBIC BOTTLES Organism ID to follow CRITICAL RESULT CALLED TO, READ BACK BY AND VERIFIED WITH: T. GREEN PHARMD, AT 0630 08/17/18 BY D. VANHOOK    Culture (A)  Final    KLEBSIELLA PNEUMONIAE SUSCEPTIBILITIES TO FOLLOW Performed at [facility], [place] [address] [place], [place] [address]    Report Status  PENDING  Incomplete  Blood Culture ID Panel (Reflexed)     Status: Abnormal   Collection Time: 08/16/18  8:12 AM  Result Value Ref Range Status   Enterococcus species NOT DETECTED NOT DETECTED Final   Listeria monocytogenes NOT DETECTED NOT DETECTED Final   Staphylococcus species NOT DETECTED NOT DETECTED Final   Staphylococcus aureus (BCID) NOT DETECTED NOT DETECTED Final   Streptococcus species  NOT DETECTED NOT DETECTED Final   Streptococcus agalactiae NOT DETECTED NOT DETECTED Final   Streptococcus pneumoniae NOT DETECTED NOT DETECTED Final   Streptococcus pyogenes NOT DETECTED NOT DETECTED Final   Acinetobacter baumannii NOT DETECTED NOT DETECTED Final   Enterobacteriaceae species DETECTED (A) NOT DETECTED Final    Comment: Enterobacteriaceae represent a large family of gram-negative bacteria, not a single organism. CRITICAL RESULT CALLED TO, READ BACK BY AND VERIFIED WITH: T. GREEN PHARMD, AT 0734 08/17/18 BY D. VANHOOK    Enterobacter cloacae complex NOT DETECTED NOT DETECTED Final   Escherichia coli NOT DETECTED NOT DETECTED Final   Klebsiella oxytoca NOT DETECTED NOT DETECTED Final   Klebsiella pneumoniae DETECTED (A) NOT DETECTED Final    Comment: CRITICAL RESULT CALLED TO, READ BACK BY AND VERIFIED WITH: T. GREEN PHARMD, AT 8563 08/17/18 BY D. VANHOOK    Proteus species NOT DETECTED NOT DETECTED Final   Serratia marcescens NOT DETECTED NOT DETECTED Final   Carbapenem resistance NOT DETECTED NOT DETECTED Final   Haemophilus influenzae NOT DETECTED NOT DETECTED Final   Neisseria meningitidis NOT DETECTED NOT DETECTED Final   Pseudomonas aeruginosa NOT DETECTED NOT DETECTED Final   Candida albicans NOT DETECTED NOT DETECTED Final   Candida glabrata NOT DETECTED NOT DETECTED Final   Candida krusei NOT DETECTED NOT DETECTED Final   Candida parapsilosis NOT DETECTED NOT DETECTED Final   Candida tropicalis NOT DETECTED NOT DETECTED Final    Comment: Performed at [facility], [place] [address] [place], [place] [address]  Gastrointestinal Panel by PCR , Stool     Status: None   Collection Time: 08/17/18  7:00 AM  Result Value Ref Range Status   Campylobacter species NOT DETECTED NOT DETECTED Final   Plesimonas shigelloides NOT DETECTED NOT DETECTED Final   Salmonella species NOT DETECTED NOT DETECTED Final   Yersinia enterocolitica NOT DETECTED NOT DETECTED Final   Vibrio species NOT DETECTED NOT DETECTED Final   Vibrio cholerae NOT DETECTED NOT DETECTED Final   Enteroaggregative E coli (EAEC) NOT DETECTED NOT DETECTED Final   Enteropathogenic E coli (EPEC) NOT DETECTED NOT DETECTED Final   Enterotoxigenic E coli (ETEC) NOT DETECTED NOT DETECTED Final   Shiga like toxin producing E coli (STEC) NOT DETECTED NOT DETECTED Final   Shigella/Enteroinvasive E coli (EIEC) NOT DETECTED NOT DETECTED Final   Cryptosporidium NOT DETECTED NOT DETECTED Final   Cyclospora cayetanensis NOT DETECTED NOT DETECTED Final   Entamoeba histolytica NOT DETECTED NOT DETECTED Final   Giardia lamblia NOT DETECTED NOT DETECTED Final   Adenovirus F40/41 NOT DETECTED NOT DETECTED Final   Astrovirus NOT DETECTED NOT DETECTED Final   Norovirus GI/GII NOT DETECTED NOT DETECTED Final   Rotavirus A NOT DETECTED NOT DETECTED Final   Sapovirus (I, II, IV, and V) NOT DETECTED NOT DETECTED Final    Comment: Performed at [facility], [address], [place], [place] [address]    Microbiology: Recent Results (from the past 240 hour(s))  Urine culture     Status: Abnormal   Collection Time: 08/16/18  3:54 AM  Result Value Ref Range Status   Specimen Description   Final    URINE, RANDOM Performed at [facility], [place] [address] [place], [place] [address]    Special Requests   Final    NONE Performed at [facility], [place] [address] [place],  [address]    Culture (A)  Final    <10,000 COLONIES/mL INSIGNIFICANT GROWTH  Performed  at [facility], [place] [address], [place], [place] [address]    Report Status 08/17/2018 FINAL  Final  Culture, blood (routine x 2)     Status: None (Preliminary result)   Collection Time: 08/16/18  8:12 AM  Result Value Ref Range Status   Specimen Description   Final    BLOOD RIGHT ARM Performed at [facility] [address], [place], [place] [address]    Special Requests   Final    BOTTLES DRAWN AEROBIC AND ANAEROBIC Blood Culture adequate volume Performed at [facility] [address] [place], [place] [address]    Culture   Final    NO GROWTH 2 DAYS Performed at [facility] [address], [place], [place] [address]    Report Status PENDING  Incomplete  Culture, blood (routine x 2)     Status: Abnormal (Preliminary result)   Collection Time: 08/16/18  8:12 AM  Result Value Ref Range Status   Specimen Description   Final    BLOOD LEFT HAND Performed at [facility] [address] [place], [place] [address]    Special Requests   Final    BOTTLES DRAWN AEROBIC AND ANAEROBIC Blood Culture adequate volume Performed at [facility] [address], [place], [place] [address]    Culture  Setup Time   Final    GRAM NEGATIVE RODS IN BOTH AEROBIC AND ANAEROBIC BOTTLES Organism ID to follow CRITICAL RESULT CALLED TO, READ BACK BY AND VERIFIED WITH: T. GREEN PHARMD, AT 4854 08/17/18 BY D. VANHOOK    Culture (A)  Final    KLEBSIELLA PNEUMONIAE SUSCEPTIBILITIES TO FOLLOW Performed at [facility], [place] [address] [place], [place] [address]    Report Status PENDING  Incomplete  Blood Culture ID Panel (Reflexed)     Status: Abnormal   Collection Time: 08/16/18  8:12 AM  Result Value Ref Range Status   Enterococcus species NOT DETECTED NOT DETECTED Final   Listeria monocytogenes NOT DETECTED NOT DETECTED Final   Staphylococcus species NOT DETECTED NOT DETECTED Final   Staphylococcus  aureus (BCID) NOT DETECTED NOT DETECTED Final   Streptococcus species NOT DETECTED NOT DETECTED Final   Streptococcus agalactiae NOT DETECTED NOT DETECTED Final   Streptococcus pneumoniae NOT DETECTED NOT DETECTED Final   Streptococcus pyogenes NOT DETECTED NOT DETECTED Final   Acinetobacter baumannii NOT DETECTED NOT DETECTED Final   Enterobacteriaceae species DETECTED (A) NOT DETECTED Final    Comment: Enterobacteriaceae represent a large family of gram-negative bacteria, not a single organism. CRITICAL RESULT CALLED TO, READ BACK BY AND VERIFIED WITH: T. GREEN PHARMD, AT 0734 08/17/18 BY D. VANHOOK    Enterobacter cloacae complex NOT DETECTED NOT DETECTED Final   Escherichia coli NOT DETECTED NOT DETECTED Final   Klebsiella oxytoca NOT DETECTED NOT DETECTED Final   Klebsiella pneumoniae DETECTED (A) NOT DETECTED Final    Comment: CRITICAL RESULT CALLED TO, READ BACK BY AND VERIFIED WITH: T. GREEN PHARMD, AT 5009 08/17/18 BY D. VANHOOK    Proteus species NOT DETECTED NOT DETECTED Final   Serratia marcescens NOT DETECTED NOT DETECTED Final   Carbapenem resistance NOT DETECTED NOT DETECTED Final   Haemophilus influenzae NOT DETECTED NOT DETECTED Final   Neisseria meningitidis NOT DETECTED NOT DETECTED Final   Pseudomonas aeruginosa NOT DETECTED NOT DETECTED Final   Candida albicans NOT DETECTED NOT DETECTED Final   Candida glabrata NOT DETECTED NOT DETECTED Final  Candida krusei NOT DETECTED NOT DETECTED Final   Candida parapsilosis NOT DETECTED NOT DETECTED Final   Candida tropicalis NOT DETECTED NOT DETECTED Final    Comment: Performed at [facility], [place] [address], [place], [place] [address]  Gastrointestinal Panel by PCR , Stool     Status: None   Collection Time: 08/17/18  7:00 AM  Result Value Ref Range Status   Campylobacter species NOT DETECTED NOT DETECTED Final   Plesimonas shigelloides NOT DETECTED NOT DETECTED Final   Salmonella species NOT DETECTED NOT  DETECTED Final   Yersinia enterocolitica NOT DETECTED NOT DETECTED Final   Vibrio species NOT DETECTED NOT DETECTED Final   Vibrio cholerae NOT DETECTED NOT DETECTED Final   Enteroaggregative E coli (EAEC) NOT DETECTED NOT DETECTED Final   Enteropathogenic E coli (EPEC) NOT DETECTED NOT DETECTED Final   Enterotoxigenic E coli (ETEC) NOT DETECTED NOT DETECTED Final   Shiga like toxin producing E coli (STEC) NOT DETECTED NOT DETECTED Final   Shigella/Enteroinvasive E coli (EIEC) NOT DETECTED NOT DETECTED Final   Cryptosporidium NOT DETECTED NOT DETECTED Final   Cyclospora cayetanensis NOT DETECTED NOT DETECTED Final   Entamoeba histolytica NOT DETECTED NOT DETECTED Final   Giardia lamblia NOT DETECTED NOT DETECTED Final   Adenovirus F40/41 NOT DETECTED NOT DETECTED Final   Astrovirus NOT DETECTED NOT DETECTED Final   Norovirus GI/GII NOT DETECTED NOT DETECTED Final   Rotavirus A NOT DETECTED NOT DETECTED Final   Sapovirus (I, II, IV, and V) NOT DETECTED NOT DETECTED Final    Comment: Performed at [facility], [place]., [place], [place] [address]    Radiographs and labs were personally reviewed by me.   Bobby Rumpf, MD [facility] for Infectious [facility] [phone] 08/18/2018, 12:42 PM

## 2018-08-18 NOTE — Progress Notes (Signed)
[facility] Coordinator will follow pt with ID team to support IV ABX if needed and IV hydration if ordered by surgery team for home.  If patient discharges after hours, please call [phone].   [name] 08/18/2018, 5:47 PM

## 2018-08-18 NOTE — Progress Notes (Signed)
Offered support to patient. Await repeat CT.  Encouraged mobilization.  No resources needed at this time.  Questions answered.  Will continue to monitor for needs.

## 2018-08-18 NOTE — Progress Notes (Signed)
IV in left AC occluded and removed. IV team inserted new IV in right forearm.  Edwin Dada, RN 5:29 AM

## 2018-08-19 ENCOUNTER — Inpatient Hospital Stay (HOSPITAL_COMMUNITY): Payer: BLUE CROSS/BLUE SHIELD | Admitting: Anesthesiology

## 2018-08-19 ENCOUNTER — Encounter (HOSPITAL_COMMUNITY): Payer: Self-pay | Admitting: *Deleted

## 2018-08-19 ENCOUNTER — Encounter (HOSPITAL_COMMUNITY): Admission: EM | Disposition: A | Discharge status: Home Health | Payer: Self-pay | Source: Home / Self Care

## 2018-08-19 DIAGNOSIS — Z95828 Presence of other vascular implants and grafts: Secondary | ICD-10-CM

## 2018-08-19 HISTORY — PX: ESOPHAGOGASTRODUODENOSCOPY (EGD) WITH PROPOFOL: SHX5813

## 2018-08-19 LAB — CBC WITH DIFFERENTIAL/PLATELET
ABS IMMATURE GRANULOCYTES: 0.01 10*3/uL (ref 0.00–0.07)
BASOS ABS: 0 10*3/uL (ref 0.0–0.1)
Basophils Relative: 1 %
EOS PCT: 3 %
Eosinophils Absolute: 0.1 10*3/uL (ref 0.0–0.5)
HCT: 36 % (ref 36.0–46.0)
HEMOGLOBIN: 11.4 g/dL — AB (ref 12.0–15.0)
Immature Granulocytes: 0 %
LYMPHS ABS: 1.4 10*3/uL (ref 0.7–4.0)
LYMPHS PCT: 46 %
MCH: 30.5 pg (ref 26.0–34.0)
MCHC: 31.7 g/dL (ref 30.0–36.0)
MCV: 96.3 fL (ref 80.0–100.0)
MONO ABS: 0.6 10*3/uL (ref 0.1–1.0)
Monocytes Relative: 19 %
NRBC: 0 % (ref 0.0–0.2)
Neutro Abs: 1 10*3/uL — ABNORMAL LOW (ref 1.7–7.7)
Neutrophils Relative %: 31 %
Platelets: 150 10*3/uL (ref 150–400)
RBC: 3.74 MIL/uL — ABNORMAL LOW (ref 3.87–5.11)
RDW: 13.2 % (ref 11.5–15.5)
WBC: 3.2 10*3/uL — ABNORMAL LOW (ref 4.0–10.5)

## 2018-08-19 LAB — CULTURE, BLOOD (ROUTINE X 2): Special Requests: ADEQUATE

## 2018-08-19 LAB — COMPREHENSIVE METABOLIC PANEL
ALBUMIN: 3.4 g/dL — AB (ref 3.5–5.0)
ALT: 30 U/L (ref 0–44)
AST: 36 U/L (ref 15–41)
Alkaline Phosphatase: 48 U/L (ref 38–126)
Anion gap: 7 (ref 5–15)
CHLORIDE: 107 mmol/L (ref 98–111)
CO2: 25 mmol/L (ref 22–32)
Calcium: 8.3 mg/dL — ABNORMAL LOW (ref 8.9–10.3)
Creatinine, Ser: 0.63 mg/dL (ref 0.44–1.00)
GFR calc Af Amer: >60 mL/min (ref >60)
GFR calc non Af Amer: >60 mL/min (ref >60)
Glucose, Bld: 123 mg/dL — ABNORMAL HIGH (ref 70–99)
Potassium: 3.2 mmol/L — ABNORMAL LOW (ref 3.5–5.1)
SODIUM: 139 mmol/L (ref 135–145)
Total Bilirubin: 0.5 mg/dL (ref 0.3–1.2)
Total Protein: 6.5 g/dL (ref 6.5–8.1)

## 2018-08-19 LAB — HIV ANTIBODY (ROUTINE TESTING W REFLEX): HIV SCREEN 4TH GENERATION: NONREACTIVE

## 2018-08-19 SURGERY — ESOPHAGOGASTRODUODENOSCOPY (EGD) WITH PROPOFOL
Anesthesia: Monitor Anesthesia Care

## 2018-08-19 MED ORDER — PROPOFOL 10 MG/ML IV BOLUS
INTRAVENOUS | Status: DC | PRN
  Administered 2018-08-19: 30 mg via INTRAVENOUS
  Administered 2018-08-19: 50 mg via INTRAVENOUS
  Administered 2018-08-19: 20 mg via INTRAVENOUS
  Administered 2018-08-19 (×2): 50 mg via INTRAVENOUS

## 2018-08-19 MED ORDER — PROPOFOL 500 MG/50ML IV EMUL
INTRAVENOUS | Status: DC | PRN
  Administered 2018-08-19: 150 ug/kg/min via INTRAVENOUS

## 2018-08-19 MED ORDER — PROMETHAZINE HCL 25 MG/ML IJ SOLN
6.2500 mg | Freq: Once | INTRAMUSCULAR | Status: AC
  Administered 2018-08-19: 6.25 mg via INTRAVENOUS

## 2018-08-19 MED ORDER — LIDOCAINE 2% (20 MG/ML) 5 ML SYRINGE
INTRAMUSCULAR | Status: DC | PRN
  Administered 2018-08-19: 60 mg via INTRAVENOUS

## 2018-08-19 MED ORDER — ZOLPIDEM TARTRATE 5 MG PO TABS
5.0000 mg | ORAL_TABLET | Freq: Once | ORAL | Status: AC
  Administered 2018-08-22: 5 mg via ORAL
  Filled 2018-08-19: qty 1

## 2018-08-19 MED ORDER — LURASIDONE HCL 40 MG PO TABS
60.0000 mg | ORAL_TABLET | Freq: Every day | ORAL | Status: DC
  Administered 2018-08-20 – 2018-08-24 (×5): 60 mg via ORAL
  Filled 2018-08-19 (×6): qty 1

## 2018-08-19 MED ORDER — PROPOFOL 10 MG/ML IV BOLUS
INTRAVENOUS | Status: AC
  Filled 2018-08-19: qty 60

## 2018-08-19 MED ORDER — PROMETHAZINE HCL 25 MG/ML IJ SOLN
INTRAMUSCULAR | Status: AC
  Filled 2018-08-19: qty 1

## 2018-08-19 MED ORDER — LACTATED RINGERS IV SOLN
INTRAVENOUS | Status: DC
  Administered 2018-08-19: 14:00:00 via INTRAVENOUS

## 2018-08-19 SURGICAL SUPPLY — 15 items

## 2018-08-19 NOTE — H&P (View-Only) (Signed)
Referring Provider: Dr. Hassell Done Primary Care Physician:  Scifres, Durel Salts Primary Gastroenterologist:  Althia Forts  Reason for Consultation:  Abdominal pain  HPI: Kaitlyn Johnson is a 37 y.o. female s/p sleeve gsatrectomy last month who started having epigastric pain 1.5 weeks following surgery that would feel like a "washcloth being wrung out." Pain comes and goes and will last one hour max. Pain is 10/10 in intensity. +N/V. When she eats then she will have onset of the abdominal pain. Has been getting IVFs via a PICC line due to her recurrent N/V. Has lost 26 pounds since surgery. Having fevers to 100.5. Tmax 101.5 overnight. Found to have Klebsiella pneumonia bacteremia and PICC line was removed. Abd/pelvis CT with contrast was negative. Barium swallow last week was unrevealing.  Past Medical History:  Diagnosis Date  . Anemia 11/26/2011  . Anxiety   . Asthma   . Celiac and mesenteric artery injury   . Cushing's syndrome ([place])    06/21/16- "in remission"  . Depression   . H/O hiatal hernia   . History of kidney stones   . POTS (postural orthostatic tachycardia syndrome)   . Shortness of breath dyspnea    with exertertion  . Sphincter of Oddi dysfunction   . Von Willebrand disease ([place])    bruising easy    Past Surgical History:  Procedure Laterality Date  . ABDOMINAL HYSTERECTOMY  08/2010  . ANTERIOR CERVICAL DECOMP/DISCECTOMY FUSION N/A 03/31/2013   Procedure: ANTERIOR CERVICAL DECOMPRESSION/DISCECTOMY FUSION 1 LEVEL Cervical five-six;  Surgeon: Faythe Ghee, MD;  Location: [place] [facility];  Service: Neurosurgery;  Laterality: N/A;  . APPENDECTOMY    . Cholangio-Pancreatography with Spintectorotomy + Stent  07/16/2012   01/15/14  . CHOLECYSTECTOMY    . LAPAROSCOPIC APPENDECTOMY N/A 02/28/2017   Procedure: APPENDECTOMY LAPAROSCOPIC;  Surgeon: Erroll Luna, MD;  Location: [facility];  Service: General;  Laterality: N/A;  . LAPAROSCOPIC ENDOMETRIOSIS FULGURATION    .  LAPAROSCOPIC GASTRIC SLEEVE RESECTION N/A 07/15/2018   Procedure: LAPAROSCOPIC GASTRIC SLEEVE RESECTION, UPPER ENDO, ERAS Pathway;  Surgeon: Johnathan Hausen, MD;  Location: [facility];  Service: General;  Laterality: N/A;  . LUMBAR LAMINECTOMY    . LUMBAR LAMINECTOMY/DECOMPRESSION MICRODISCECTOMY Left 06/22/2016   Procedure: MICRODISCECTOMY LEFT LUMBAR FOUR-FIVE;  Surgeon: Consuella Lose, MD;  Location: [facility];  Service: Neurosurgery;  Laterality: Left;  . ROUX-EN-Y PROCEDURE  04/1999    Prior to Admission medications   Medication Sig Start Date End Date Taking? Authorizing Provider  AMBIEN CR 12.5 MG CR tablet Take 12.5 mg by mouth at bedtime. 10/31/15  Yes [provider]  clonazePAM (KLONOPIN) 1 MG tablet Take 1 tablet (1 mg total) by mouth 2 (two) times daily as needed for anxiety. Patient taking differently: Take 1 mg by mouth daily as needed for anxiety.  12/07/13  Yes Theodis Blaze, MD  diltiazem (CARDIZEM CD) 180 MG 24 hr capsule Take 180 mg by mouth daily.    Yes [provider]  DULoxetine (CYMBALTA) 30 MG capsule Take 30 mg by mouth daily.   Yes [provider]  famotidine (PEPCID) 20 MG tablet Take 20 mg by mouth daily as needed for heartburn or indigestion.   Yes [provider]  hydrOXYzine (ATARAX/VISTARIL) 10 MG tablet Take 10 mg by mouth at bedtime.    Yes [provider]  LATUDA 60 MG TABS Take 60 mg by mouth daily with supper.    Yes [provider]  metoprolol succinate (TOPROL-XL) 25 MG 24  hr tablet Take 25 mg by mouth See admin instructions. Take with 50 mg in the AM & PM. (12m total BID) 07/16/18  Yes [provider]  metoprolol succinate (TOPROL-XL) 50 MG 24 hr tablet Take 50 mg by mouth See admin instructions. Take with 220min the AM & PM. (7528motal BID)   Yes [provider]  montelukast (SINGULAIR) 10 MG tablet Take 10 mg by mouth at bedtime.   Yes [provider]  omeprazole (PRILOSEC)  20 MG capsule Take 1 capsule (20 mg total) by mouth daily. 12/31/16  Yes LeaOcie Cornfield PA-C  ondansetron (ZOFRAN ODT) 4 MG disintegrating tablet 4mg84mT q4 hours prn nausea/vomit 08/10/18  Yes ZammMilton Ferguson  OVER THE COUNTER MEDICATION Take 1 tablet by mouth daily. Bariatric vitamin   Yes [provider]  traMADol (ULTRAM) 50 MG tablet Take 50 mg by mouth 4 (four) times daily. 08/12/18  Yes [provider]  traZODone (DESYREL) 100 MG tablet Take 300 mg by mouth at bedtime.    Yes [provider]    Scheduled Meds: . DULoxetine  30 mg Oral Daily  . enoxaparin (LOVENOX) injection  40 mg Subcutaneous Q24H  . Lurasidone HCl  60 mg Oral Q supper  . metoprolol succinate  25 mg Oral Daily  . metoprolol succinate  75 mg Oral BID  . protein supplement shake  2 oz Oral Q4H  . zolpidem  5 mg Oral QHS   Continuous Infusions: . cefTRIAXone (ROCEPHIN)  IV 2 g (08/19/18 0748)  . dextrose 5 % and 0.45% NaCl 125 mL/hr at 08/18/18 2139  . famotidine (PEPCID) IV 20 mg (08/18/18 2141)   PRN Meds:.acetaminophen **OR** acetaminophen, diphenhydrAMINE **OR** diphenhydrAMINE, HYDROmorphone (DILAUDID) injection, metoprolol tartrate, ondansetron **OR** ondansetron (ZOFRAN) IV, oxyCODONE, polyethylene glycol, promethazine, sodium chloride flush, sodium chloride flush, traMADol  Allergies as of 08/16/2018 - Review Complete 08/16/2018  Allergen Reaction Noted  . Bee venom Anaphylaxis 11/04/2015  . Reglan [metoclopramide] Other (See Comments) 06/02/2012  . Shellfish allergy Anaphylaxis 06/02/2012  . Wheat bran Other (See Comments) 06/02/2012  . Adhesive [tape] Hives 02/28/2017  . Gluten meal Other (See Comments) 02/28/2017  . Sulfa antibiotics Rash 06/02/2012  . Tartrazine Rash 06/02/2012  . Yellow dye Rash 11/04/2015    Family History  Problem Relation Age of Onset  . Hypertension Mother     Social History   Socioeconomic History  . Marital status: Married     Spouse name: Not on file  . Number of children: Not on file  . Years of education: Not on file  . Highest education level: Not on file  Occupational History  . Not on file  Social Needs  . Financial resource strain: Not on file  . Food insecurity:    Worry: Never true    Inability: Never true  . Transportation needs:    Medical: Not on file    Non-medical: Not on file  Tobacco Use  . Smoking status: Never Smoker  . Smokeless tobacco: Never Used  Substance and Sexual Activity  . Alcohol use: Yes    Alcohol/week: 5.0 standard drinks    Types: 5 Glasses of wine per week  . Drug use: No  . Sexual activity: Yes    Partners: Male    Birth control/protection: Surgical  Lifestyle  . Physical activity:    Days per week: Not on file    Minutes per session: Not on file  . Stress: Not on file  Relationships  .  Social connections:    Talks on phone: Not on file    Gets together: Not on file    Attends religious service: Not on file    Active member of club or organization: Not on file    Attends meetings of clubs or organizations: Not on file    Relationship status: Not on file  . Intimate partner violence:    Fear of current or ex partner: Not on file    Emotionally abused: Not on file    Physically abused: Not on file    Forced sexual activity: Not on file  Other Topics Concern  . Not on file  Social History Narrative   Regular exercise: can't at this time   Caffeine use: no    Review of Systems: All negative except as stated above in HPI.  Physical Exam: Vital signs: Vitals:   08/18/18 2137 08/19/18 0524  BP: 118/76 121/78  Pulse: 72 67  Resp: 18 18  Temp: 98.7 F (37.1 C) 98.2 F (36.8 C)  SpO2: 98%    Last BM Date: 08/18/18 General:   Alert,  Well-developed, well-nourished, pleasant and cooperative in NAD Head: normocephalic, atraumatic Eyes: anicteric sclera ENT: oropharynx clear Neck: supple, nontender Lungs:  Clear throughout to auscultation.   No  wheezes, crackles, or rhonchi. No acute distress. Heart:  Regular rate and rhythm; no murmurs, clicks, rubs,  or gallops. Abdomen: epigastric tenderness with minimal guarding, otherwise nontender, soft, nondistended, +BS  Rectal:  Deferred Ext: no edema  GI:  Lab Results: Recent Labs    08/17/18 0438 08/18/18 0425 08/19/18 0402  WBC 2.6* 2.8* 3.2*  HGB 11.7* 12.2 11.4*  HCT 37.0 37.8 36.0  PLT 121* 126* 150   BMET Recent Labs    08/17/18 0438 08/18/18 0425 08/19/18 0402  NA 138 138 139  K 3.1* 3.2* 3.2*  CL 106 107 107  CO2 25 24 25   GLUCOSE 139* 129* 123*  BUN <5* <5* <5*  CREATININE 0.66 0.64 0.63  CALCIUM 8.1* 8.3* 8.3*   LFT Recent Labs    08/19/18 0402  PROT 6.5  ALBUMIN 3.4*  AST 36  ALT 30  ALKPHOS 48  BILITOT 0.5   PT/INR No results for input(s): LABPROT, INR in the last 72 hours.   Studies/Results: Ct Abdomen Pelvis W Contrast  Result Date: 08/18/2018 CLINICAL DATA:  Diffuse abdominal pain, nausea and vomiting, and fever. One month postop from gastric sleeve resection. Endometriosis with previous hysterectomy and appendectomy. EXAM: CT ABDOMEN AND PELVIS WITH CONTRAST TECHNIQUE: Multidetector CT imaging of the abdomen and pelvis was performed using the standard protocol following bolus administration of intravenous contrast. CONTRAST:  11m OMNIPAQUE IOHEXOL 300 MG/ML SOLN, 371mOMNIPAQUE IOHEXOL 300 MG/ML SOLN COMPARISON:  02/28/2017 FINDINGS: Lower Chest: No acute findings. Hepatobiliary: No hepatic masses identified. Focal fatty infiltration again seen adjacent to the falciform ligament. Prior cholecystectomy. No evidence of biliary obstruction. Pancreas:  No mass or inflammatory changes. Spleen: Within normal limits in size and appearance. Adrenals/Urinary Tract: No masses identified. No evidence of hydronephrosis. Stomach/Bowel: Expected postop changes from sleeve gastrectomy. No evidence of obstruction, inflammatory process or abnormal fluid  collections. Vascular/Lymphatic: No pathologically enlarged lymph nodes. No abdominal aortic aneurysm. Reproductive: Prior hysterectomy noted. Adnexal regions are unremarkable in appearance. Other:  None. Musculoskeletal:  No suspicious bone lesions identified. IMPRESSION: No acute findings or other significant abnormality identified. Electronically Signed   By: JoEarle Gell.D.   On: 08/18/2018 16:05    Impression/Plan: [age]o  s/p sleeve gastrectomy with recurrent epigastric pain, nausea and vomiting. Question gastric outlet obstruction vs peptic ulcer/marginal ulcer. EGD today to further evaluate. NPO. Supportive care. Continue IV Pepcid for now but will change to PPI depending on EGD findings.    LOS: 3 days   Lear Ng  08/19/2018, 8:38 AM  Questions please call [phone]

## 2018-08-19 NOTE — Anesthesia Preprocedure Evaluation (Addendum)
Anesthesia Evaluation  Patient identified by MRN, date of birth, ID band Patient awake    Reviewed: Allergy & Precautions, NPO status , Patient's Chart, lab work & pertinent test results  Airway Mallampati: I  TM Distance: >3 FB Neck ROM: Full    Dental no notable dental hx. (+) Teeth Intact, Dental Advisory Given   Pulmonary asthma ,    Pulmonary exam normal breath sounds clear to auscultation       Cardiovascular negative cardio ROS Normal cardiovascular exam Rhythm:Regular Rate:Normal     Neuro/Psych Anxiety Depression negative neurological ROS  negative psych ROS   GI/Hepatic Neg liver ROS, GERD  ,  Endo/Other  negative endocrine ROS  Renal/GU negative Renal ROS  negative genitourinary   Musculoskeletal negative musculoskeletal ROS (+)   Abdominal   Peds  Hematology negative hematology ROS (+)   Anesthesia Other Findings Epigastric pain  POTS, Von Willebrands, Celiac disease  Reproductive/Obstetrics                           Anesthesia Physical Anesthesia Plan  ASA: III  Anesthesia Plan: MAC   Post-op Pain Management:    Induction: Intravenous  PONV Risk Score and Plan: 3 and Treatment may vary due to age or medical condition and Propofol infusion  Airway Management Planned: Natural Airway and Simple Face Mask  Additional Equipment:   Intra-op Plan:   Post-operative Plan:   Informed Consent: I have reviewed the patients History and Physical, chart, labs and discussed the procedure including the risks, benefits and alternatives for the proposed anesthesia with the patient or authorized representative who has indicated his/her understanding and acceptance.   Dental advisory given  Plan Discussed with: CRNA  Anesthesia Plan Comments:        Anesthesia Quick Evaluation

## 2018-08-19 NOTE — Progress Notes (Signed)
Patient for EGD today.  No needs at this time.  Will continue to monitor.

## 2018-08-19 NOTE — Interval H&P Note (Signed)
History and Physical Interval Note:  08/19/2018 2:29 PM  Kaitlyn Johnson  has presented today for surgery, with the diagnosis of epigastric pain  The various methods of treatment have been discussed with the patient and family. After consideration of risks, benefits and other options for treatment, the patient has consented to  Procedure(s): ESOPHAGOGASTRODUODENOSCOPY (EGD) WITH PROPOFOL (N/A) as a surgical intervention .  The patient's history has been reviewed, patient examined, no change in status, stable for surgery.  I have reviewed the patient's chart and labs.  Questions were answered to the patient's satisfaction.     Lear Ng

## 2018-08-19 NOTE — Consult Note (Signed)
Referring Provider: Dr. Hassell Done Primary Care Physician:  Scifres, Durel Salts Primary Gastroenterologist:  Althia Forts  Reason for Consultation:  Abdominal pain  HPI: Kaitlyn Johnson is a 37 y.o. female s/p sleeve gsatrectomy last month who started having epigastric pain 1.5 weeks following surgery that would feel like a "washcloth being wrung out." Pain comes and goes and will last one hour max. Pain is 10/10 in intensity. +N/V. When she eats then she will have onset of the abdominal pain. Has been getting IVFs via a PICC line due to her recurrent N/V. Has lost 26 pounds since surgery. Having fevers to 100.5. Tmax 101.5 overnight. Found to have Klebsiella pneumonia bacteremia and PICC line was removed. Abd/pelvis CT with contrast was negative. Barium swallow last week was unrevealing.  Past Medical History:  Diagnosis Date  . Anemia 11/26/2011  . Anxiety   . Asthma   . Celiac and mesenteric artery injury   . Cushing's syndrome ([place])    06/21/16- "in remission"  . Depression   . H/O hiatal hernia   . History of kidney stones   . POTS (postural orthostatic tachycardia syndrome)   . Shortness of breath dyspnea    with exertertion  . Sphincter of Oddi dysfunction   . Von Willebrand disease ([place])    bruising easy    Past Surgical History:  Procedure Laterality Date  . ABDOMINAL HYSTERECTOMY  08/2010  . ANTERIOR CERVICAL DECOMP/DISCECTOMY FUSION N/A 03/31/2013   Procedure: ANTERIOR CERVICAL DECOMPRESSION/DISCECTOMY FUSION 1 LEVEL Cervical five-six;  Surgeon: Faythe Ghee, MD;  Location: [place];  Service: Neurosurgery;  Laterality: N/A;  . APPENDECTOMY    . Cholangio-Pancreatography with Spintectorotomy + Stent  07/16/2012   01/15/14  . CHOLECYSTECTOMY    . LAPAROSCOPIC APPENDECTOMY N/A 02/28/2017   Procedure: APPENDECTOMY LAPAROSCOPIC;  Surgeon: Erroll Luna, MD;  Location: [facility];  Service: General;  Laterality: N/A;  . LAPAROSCOPIC ENDOMETRIOSIS FULGURATION    .  LAPAROSCOPIC GASTRIC SLEEVE RESECTION N/A 07/15/2018   Procedure: LAPAROSCOPIC GASTRIC SLEEVE RESECTION, UPPER ENDO, ERAS Pathway;  Surgeon: Johnathan Hausen, MD;  Location: [facility];  Service: General;  Laterality: N/A;  . LUMBAR LAMINECTOMY    . LUMBAR LAMINECTOMY/DECOMPRESSION MICRODISCECTOMY Left 06/22/2016   Procedure: MICRODISCECTOMY LEFT LUMBAR FOUR-FIVE;  Surgeon: Consuella Lose, MD;  Location: [place] [facility];  Service: Neurosurgery;  Laterality: Left;  . ROUX-EN-Y PROCEDURE  04/1999    Prior to Admission medications   Medication Sig Start Date End Date Taking? Authorizing Provider  AMBIEN CR 12.5 MG CR tablet Take 12.5 mg by mouth at bedtime. 10/31/15  Yes [provider]  clonazePAM (KLONOPIN) 1 MG tablet Take 1 tablet (1 mg total) by mouth 2 (two) times daily as needed for anxiety. Patient taking differently: Take 1 mg by mouth daily as needed for anxiety.  12/07/13  Yes Theodis Blaze, MD  diltiazem (CARDIZEM CD) 180 MG 24 hr capsule Take 180 mg by mouth daily.    Yes [provider]  DULoxetine (CYMBALTA) 30 MG capsule Take 30 mg by mouth daily.   Yes [provider]  famotidine (PEPCID) 20 MG tablet Take 20 mg by mouth daily as needed for heartburn or indigestion.   Yes [provider]  hydrOXYzine (ATARAX/VISTARIL) 10 MG tablet Take 10 mg by mouth at bedtime.    Yes [provider]  LATUDA 60 MG TABS Take 60 mg by mouth daily with supper.    Yes [provider]  metoprolol succinate (TOPROL-XL) 25 MG 24  hr tablet Take 25 mg by mouth See admin instructions. Take with 50 mg in the AM & PM. (47m total BID) 07/16/18  Yes [provider]  metoprolol succinate (TOPROL-XL) 50 MG 24 hr tablet Take 50 mg by mouth See admin instructions. Take with 248min the AM & PM. (7527motal BID)   Yes [provider]  montelukast (SINGULAIR) 10 MG tablet Take 10 mg by mouth at bedtime.   Yes [provider]  omeprazole (PRILOSEC)  20 MG capsule Take 1 capsule (20 mg total) by mouth daily. 12/31/16  Yes LeaOcie Cornfield PA-C  ondansetron (ZOFRAN ODT) 4 MG disintegrating tablet 4mg26mT q4 hours prn nausea/vomit 08/10/18  Yes ZammMilton Ferguson  OVER THE COUNTER MEDICATION Take 1 tablet by mouth daily. Bariatric vitamin   Yes [provider]  traMADol (ULTRAM) 50 MG tablet Take 50 mg by mouth 4 (four) times daily. 08/12/18  Yes [provider]  traZODone (DESYREL) 100 MG tablet Take 300 mg by mouth at bedtime.    Yes [provider]    Scheduled Meds: . DULoxetine  30 mg Oral Daily  . enoxaparin (LOVENOX) injection  40 mg Subcutaneous Q24H  . Lurasidone HCl  60 mg Oral Q supper  . metoprolol succinate  25 mg Oral Daily  . metoprolol succinate  75 mg Oral BID  . protein supplement shake  2 oz Oral Q4H  . zolpidem  5 mg Oral QHS   Continuous Infusions: . cefTRIAXone (ROCEPHIN)  IV 2 g (08/19/18 0748)  . dextrose 5 % and 0.45% NaCl 125 mL/hr at 08/18/18 2139  . famotidine (PEPCID) IV 20 mg (08/18/18 2141)   PRN Meds:.acetaminophen **OR** acetaminophen, diphenhydrAMINE **OR** diphenhydrAMINE, HYDROmorphone (DILAUDID) injection, metoprolol tartrate, ondansetron **OR** ondansetron (ZOFRAN) IV, oxyCODONE, polyethylene glycol, promethazine, sodium chloride flush, sodium chloride flush, traMADol  Allergies as of 08/16/2018 - Review Complete 08/16/2018  Allergen Reaction Noted  . Bee venom Anaphylaxis 11/04/2015  . Reglan [metoclopramide] Other (See Comments) 06/02/2012  . Shellfish allergy Anaphylaxis 06/02/2012  . Wheat bran Other (See Comments) 06/02/2012  . Adhesive [tape] Hives 02/28/2017  . Gluten meal Other (See Comments) 02/28/2017  . Sulfa antibiotics Rash 06/02/2012  . Tartrazine Rash 06/02/2012  . Yellow dye Rash 11/04/2015    Family History  Problem Relation Age of Onset  . Hypertension Mother     Social History   Socioeconomic History  . Marital status: Married     Spouse name: Not on file  . Number of children: Not on file  . Years of education: Not on file  . Highest education level: Not on file  Occupational History  . Not on file  Social Needs  . Financial resource strain: Not on file  . Food insecurity:    Worry: Never true    Inability: Never true  . Transportation needs:    Medical: Not on file    Non-medical: Not on file  Tobacco Use  . Smoking status: Never Smoker  . Smokeless tobacco: Never Used  Substance and Sexual Activity  . Alcohol use: Yes    Alcohol/week: 5.0 standard drinks    Types: 5 Glasses of wine per week  . Drug use: No  . Sexual activity: Yes    Partners: Male    Birth control/protection: Surgical  Lifestyle  . Physical activity:    Days per week: Not on file    Minutes per session: Not on file  . Stress: Not on file  Relationships  .  Social connections:    Talks on phone: Not on file    Gets together: Not on file    Attends religious service: Not on file    Active member of club or organization: Not on file    Attends meetings of clubs or organizations: Not on file    Relationship status: Not on file  . Intimate partner violence:    Fear of current or ex partner: Not on file    Emotionally abused: Not on file    Physically abused: Not on file    Forced sexual activity: Not on file  Other Topics Concern  . Not on file  Social History Narrative   Regular exercise: can't at this time   Caffeine use: no    Review of Systems: All negative except as stated above in HPI.  Physical Exam: Vital signs: Vitals:   08/18/18 2137 08/19/18 0524  BP: 118/76 121/78  Pulse: 72 67  Resp: 18 18  Temp: 98.7 F (37.1 C) 98.2 F (36.8 C)  SpO2: 98%    Last BM Date: 08/18/18 General:   Alert,  Well-developed, well-nourished, pleasant and cooperative in NAD Head: normocephalic, atraumatic Eyes: anicteric sclera ENT: oropharynx clear Neck: supple, nontender Lungs:  Clear throughout to auscultation.   No  wheezes, crackles, or rhonchi. No acute distress. Heart:  Regular rate and rhythm; no murmurs, clicks, rubs,  or gallops. Abdomen: epigastric tenderness with minimal guarding, otherwise nontender, soft, nondistended, +BS  Rectal:  Deferred Ext: no edema  GI:  Lab Results: Recent Labs    08/17/18 0438 08/18/18 0425 08/19/18 0402  WBC 2.6* 2.8* 3.2*  HGB 11.7* 12.2 11.4*  HCT 37.0 37.8 36.0  PLT 121* 126* 150   BMET Recent Labs    08/17/18 0438 08/18/18 0425 08/19/18 0402  NA 138 138 139  K 3.1* 3.2* 3.2*  CL 106 107 107  CO2 25 24 25   GLUCOSE 139* 129* 123*  BUN <5* <5* <5*  CREATININE 0.66 0.64 0.63  CALCIUM 8.1* 8.3* 8.3*   LFT Recent Labs    08/19/18 0402  PROT 6.5  ALBUMIN 3.4*  AST 36  ALT 30  ALKPHOS 48  BILITOT 0.5   PT/INR No results for input(s): LABPROT, INR in the last 72 hours.   Studies/Results: Ct Abdomen Pelvis W Contrast  Result Date: 08/18/2018 CLINICAL DATA:  Diffuse abdominal pain, nausea and vomiting, and fever. One month postop from gastric sleeve resection. Endometriosis with previous hysterectomy and appendectomy. EXAM: CT ABDOMEN AND PELVIS WITH CONTRAST TECHNIQUE: Multidetector CT imaging of the abdomen and pelvis was performed using the standard protocol following bolus administration of intravenous contrast. CONTRAST:  147m OMNIPAQUE IOHEXOL 300 MG/ML SOLN, 381mOMNIPAQUE IOHEXOL 300 MG/ML SOLN COMPARISON:  02/28/2017 FINDINGS: Lower Chest: No acute findings. Hepatobiliary: No hepatic masses identified. Focal fatty infiltration again seen adjacent to the falciform ligament. Prior cholecystectomy. No evidence of biliary obstruction. Pancreas:  No mass or inflammatory changes. Spleen: Within normal limits in size and appearance. Adrenals/Urinary Tract: No masses identified. No evidence of hydronephrosis. Stomach/Bowel: Expected postop changes from sleeve gastrectomy. No evidence of obstruction, inflammatory process or abnormal fluid  collections. Vascular/Lymphatic: No pathologically enlarged lymph nodes. No abdominal aortic aneurysm. Reproductive: Prior hysterectomy noted. Adnexal regions are unremarkable in appearance. Other:  None. Musculoskeletal:  No suspicious bone lesions identified. IMPRESSION: No acute findings or other significant abnormality identified. Electronically Signed   By: JoEarle Gell.D.   On: 08/18/2018 16:05    Impression/Plan: [age]o  s/p sleeve gastrectomy with recurrent epigastric pain, nausea and vomiting. Question gastric outlet obstruction vs peptic ulcer/marginal ulcer. EGD today to further evaluate. NPO. Supportive care. Continue IV Pepcid for now but will change to PPI depending on EGD findings.    LOS: 3 days   Lear Ng  08/19/2018, 8:38 AM  Questions please call [phone]

## 2018-08-19 NOTE — Anesthesia Postprocedure Evaluation (Addendum)
Anesthesia Post Note  Patient: Kaitlyn Johnson  Procedure(s) Performed: ESOPHAGOGASTRODUODENOSCOPY (EGD) WITH PROPOFOL (N/A )     Patient location during evaluation: PACU Anesthesia Type: MAC Level of consciousness: awake and alert Pain management: pain level controlled Vital Signs Assessment: post-procedure vital signs reviewed and stable Respiratory status: spontaneous breathing, nonlabored ventilation and respiratory function stable Cardiovascular status: blood pressure returned to baseline and stable Postop Assessment: no apparent nausea or vomiting Anesthetic complications: no    Last Vitals:  Vitals:   08/19/18 1540 08/19/18 1611  BP: 126/78 118/80  Pulse: 65 65  Resp: 19 18  Temp:  37 C  SpO2: 99% 100%    Last Pain:  Vitals:   08/19/18 1611  TempSrc: Oral  PainSc:                  Lidia Collum

## 2018-08-19 NOTE — Progress Notes (Signed)
INFECTIOUS DISEASE PROGRESS NOTE  ID: Kaitlyn Johnson is a 37 y.o. female with  Active Problems:   Dehydration  Subjective: Feels same, no complaints.   Abtx:  Anti-infectives (From admission, onward)   Start     Dose/Rate Route Frequency Ordered Stop   08/17/18 0800  cefTRIAXone (ROCEPHIN) 2 g in sodium chloride 0.9 % 100 mL IVPB     2 g 200 mL/hr over 30 Minutes Intravenous Every 24 hours 08/17/18 0753        Medications:  Scheduled: . DULoxetine  30 mg Oral Daily  . enoxaparin (LOVENOX) injection  40 mg Subcutaneous Q24H  . Lurasidone HCl  60 mg Oral Q supper  . metoprolol succinate  25 mg Oral Daily  . metoprolol succinate  75 mg Oral BID  . protein supplement shake  2 oz Oral Q4H  . zolpidem  5 mg Oral QHS    Objective: Vital signs in last 24 hours: Temp:  [97.7 F (36.5 C)-98.7 F (37.1 C)] 98.2 F (36.8 C) (10/22 0524) Pulse Rate:  [67-82] 67 (10/22 0524) Resp:  [18] 18 (10/22 0524) BP: (118-128)/(76-96) 121/78 (10/22 0524) SpO2:  [98 %-100 %] 98 % (10/21 2137)   General appearance: alert, cooperative and no distress Resp: clear to auscultation bilaterally Cardio: regular rate and rhythm GI: normal findings: bowel sounds normal and soft, non-tender Extremities: RUE peripheral IV  Lab Results Recent Labs    08/18/18 0425 08/19/18 0402  WBC 2.8* 3.2*  HGB 12.2 11.4*  HCT 37.8 36.0  NA 138 139  K 3.2* 3.2*  CL 107 107  CO2 24 25  BUN <5* <5*  CREATININE 0.64 0.63   Liver Panel Recent Labs    08/18/18 0425 08/19/18 0402  PROT 6.5 6.5  ALBUMIN 3.3* 3.4*  AST 24 36  ALT 27 30  ALKPHOS 52 48  BILITOT 0.3 0.5   Sedimentation Rate No results for input(s): ESRSEDRATE in the last 72 hours. C-Reactive Protein No results for input(s): CRP in the last 72 hours.  Microbiology: Recent Results (from the past 240 hour(s))  Urine culture     Status: Abnormal   Collection Time: 08/16/18  3:54 AM  Result Value Ref Range Status   Specimen Description   Final    URINE, RANDOM Performed at [facility], [address], [place], [place] [address]    Special Requests   Final    NONE Performed at [facility], [address] [place], [place] [address]    Culture (A)  Final    <10,000 COLONIES/mL INSIGNIFICANT GROWTH Performed at [facility], [address], [place], [place] [address]    Report Status 08/17/2018 FINAL  Final  Culture, blood (routine x 2)     Status: None (Preliminary result)   Collection Time: 08/16/18  8:12 AM  Result Value Ref Range Status   Specimen Description   Final    BLOOD RIGHT ARM Performed at [facility], [address] [place], [place] [address]    Special Requests   Final    BOTTLES DRAWN AEROBIC AND ANAEROBIC Blood Culture adequate volume Performed at [facility], [address], [place], [place] [address]    Culture   Final    NO GROWTH 3 DAYS Performed at [facility], [address], [place], [place] [address]    Report Status PENDING  Incomplete  Culture, blood (routine x 2)  Status: Abnormal   Collection Time: 08/16/18  8:12 AM  Result Value Ref Range Status   Specimen Description   Final    BLOOD LEFT HAND Performed at [facility], [address], [place], [place] [address]    Special Requests   Final    BOTTLES DRAWN AEROBIC AND ANAEROBIC Blood Culture adequate volume Performed at [facility], [address], [place], [place] [address]    Culture  Setup Time   Final    GRAM NEGATIVE RODS IN BOTH AEROBIC AND ANAEROBIC BOTTLES Organism ID to follow CRITICAL RESULT CALLED TO, READ BACK BY AND VERIFIED WITH: T. GREEN PHARMD, AT 0734 08/17/18 BY Renato Shin Performed at [facility], [address] [place], [place] [address]    Culture KLEBSIELLA PNEUMONIAE (A)  Final   Report Status 08/19/2018 FINAL  Final   Organism ID,  Bacteria KLEBSIELLA PNEUMONIAE  Final      Susceptibility   Klebsiella pneumoniae - MIC*    AMPICILLIN >=32 RESISTANT Resistant     CEFAZOLIN <=4 SENSITIVE Sensitive     CEFEPIME <=1 SENSITIVE Sensitive     CEFTAZIDIME <=1 SENSITIVE Sensitive     CEFTRIAXONE <=1 SENSITIVE Sensitive     CIPROFLOXACIN <=0.25 SENSITIVE Sensitive     GENTAMICIN <=1 SENSITIVE Sensitive     IMIPENEM <=0.25 SENSITIVE Sensitive     TRIMETH/SULFA <=20 SENSITIVE Sensitive     AMPICILLIN/SULBACTAM 4 SENSITIVE Sensitive     PIP/TAZO <=4 SENSITIVE Sensitive     * KLEBSIELLA PNEUMONIAE  Blood Culture ID Panel (Reflexed)     Status: Abnormal   Collection Time: 08/16/18  8:12 AM  Result Value Ref Range Status   Enterococcus species NOT DETECTED NOT DETECTED Final   Listeria monocytogenes NOT DETECTED NOT DETECTED Final   Staphylococcus species NOT DETECTED NOT DETECTED Final   Staphylococcus aureus (BCID) NOT DETECTED NOT DETECTED Final   Streptococcus species NOT DETECTED NOT DETECTED Final   Streptococcus agalactiae NOT DETECTED NOT DETECTED Final   Streptococcus pneumoniae NOT DETECTED NOT DETECTED Final   Streptococcus pyogenes NOT DETECTED NOT DETECTED Final   Acinetobacter baumannii NOT DETECTED NOT DETECTED Final   Enterobacteriaceae species DETECTED (A) NOT DETECTED Final    Comment: Enterobacteriaceae represent a large family of gram-negative bacteria, not a single organism. CRITICAL RESULT CALLED TO, READ BACK BY AND VERIFIED WITH: T. GREEN PHARMD, AT 0734 08/17/18 BY D. VANHOOK    Enterobacter cloacae complex NOT DETECTED NOT DETECTED Final   Escherichia coli NOT DETECTED NOT DETECTED Final   Klebsiella oxytoca NOT DETECTED NOT DETECTED Final   Klebsiella pneumoniae DETECTED (A) NOT DETECTED Final    Comment: CRITICAL RESULT CALLED TO, READ BACK BY AND VERIFIED WITH: T. GREEN PHARMD, AT 9147 08/17/18 BY D. VANHOOK    Proteus species NOT DETECTED NOT DETECTED Final   Serratia marcescens NOT DETECTED  NOT DETECTED Final   Carbapenem resistance NOT DETECTED NOT DETECTED Final   Haemophilus influenzae NOT DETECTED NOT DETECTED Final   Neisseria meningitidis NOT DETECTED NOT DETECTED Final   Pseudomonas aeruginosa NOT DETECTED NOT DETECTED Final   Candida albicans NOT DETECTED NOT DETECTED Final   Candida glabrata NOT DETECTED NOT DETECTED Final   Candida krusei NOT DETECTED NOT DETECTED Final   Candida parapsilosis NOT DETECTED NOT DETECTED Final   Candida tropicalis NOT DETECTED NOT DETECTED Final    Comment: Performed at [facility], [address] [place], [place] [address]  Gastrointestinal Panel  by PCR , Stool     Status: None   Collection Time: 08/17/18  7:00 AM  Result Value Ref Range Status   Campylobacter species NOT DETECTED NOT DETECTED Final   Plesimonas shigelloides NOT DETECTED NOT DETECTED Final   Salmonella species NOT DETECTED NOT DETECTED Final   Yersinia enterocolitica NOT DETECTED NOT DETECTED Final   Vibrio species NOT DETECTED NOT DETECTED Final   Vibrio cholerae NOT DETECTED NOT DETECTED Final   Enteroaggregative E coli (EAEC) NOT DETECTED NOT DETECTED Final   Enteropathogenic E coli (EPEC) NOT DETECTED NOT DETECTED Final   Enterotoxigenic E coli (ETEC) NOT DETECTED NOT DETECTED Final   Shiga like toxin producing E coli (STEC) NOT DETECTED NOT DETECTED Final   Shigella/Enteroinvasive E coli (EIEC) NOT DETECTED NOT DETECTED Final   Cryptosporidium NOT DETECTED NOT DETECTED Final   Cyclospora cayetanensis NOT DETECTED NOT DETECTED Final   Entamoeba histolytica NOT DETECTED NOT DETECTED Final   Giardia lamblia NOT DETECTED NOT DETECTED Final   Adenovirus F40/41 NOT DETECTED NOT DETECTED Final   Astrovirus NOT DETECTED NOT DETECTED Final   Norovirus GI/GII NOT DETECTED NOT DETECTED Final   Rotavirus A NOT DETECTED NOT DETECTED Final   Sapovirus (I, II, IV, and V) NOT DETECTED NOT DETECTED Final    Comment: Performed at [facility], [address] [place], [place] [address]  Culture, blood (Routine X 2) w Reflex to ID Panel     Status: None (Preliminary result)   Collection Time: 08/18/18  1:52 PM  Result Value Ref Range Status   Specimen Description   Final    BLOOD LEFT HAND Performed at [facility], [address], [place], [place] [address]    Special Requests   Final    BOTTLES DRAWN AEROBIC AND ANAEROBIC Blood Culture adequate volume Performed at [facility], [address], [place], [place] [address]    Culture   Final    NO GROWTH < 24 HOURS Performed at [facility], [address], [place], [place] [address]    Report Status PENDING  Incomplete    Studies/Results: Ct Abdomen Pelvis W Contrast  Result Date: 08/18/2018 CLINICAL DATA:  Diffuse abdominal pain, nausea and vomiting, and fever. One month postop from gastric sleeve resection. Endometriosis with previous hysterectomy and appendectomy. EXAM: CT ABDOMEN AND PELVIS WITH CONTRAST TECHNIQUE: Multidetector CT imaging of the abdomen and pelvis was performed using the standard protocol following bolus administration of intravenous contrast. CONTRAST:  OMNIPAQUE IOHEXOL 300 MG/ML SOLN, 30mL OMNIPAQUE IOHEXOL 300 MG/ML SOLN COMPARISON:  02/28/2017 FINDINGS: Lower Chest: No acute findings. Hepatobiliary: No hepatic masses identified. Focal fatty infiltration again seen adjacent to the falciform ligament. Prior cholecystectomy. No evidence of biliary obstruction. Pancreas:  No mass or inflammatory changes. Spleen: Within normal limits in size and appearance. Adrenals/Urinary Tract: No masses identified. No evidence of hydronephrosis. Stomach/Bowel: Expected postop changes from sleeve gastrectomy. No evidence of obstruction, inflammatory process or abnormal fluid collections. Vascular/Lymphatic: No pathologically enlarged lymph nodes. No abdominal aortic aneurysm. Reproductive: Prior hysterectomy noted. Adnexal regions are  unremarkable in appearance. Other:  None. Musculoskeletal:  No suspicious bone lesions identified. IMPRESSION: No acute findings or other significant abnormality identified. Electronically Signed   By: Myles Rosenthal M.D.   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  Her repeat CT is (-) She is to have EGD today Her PIC can be replaced when repeat BCx 10-21 are (-) after 4 days.  Will follow peripherally while awaiting her repeat BCx. Will place opat consult for home anbx.           Johny Sax MD, FACP Infectious Diseases (pager) [phone] [email or web address] 08/19/2018, 11:01 AM  LOS: 3 days

## 2018-08-19 NOTE — Progress Notes (Signed)
PHARMACY CONSULT NOTE FOR:  OUTPATIENT  PARENTERAL ANTIBIOTIC THERAPY (OPAT)  Indication: Kleb pneumo bacteremia  Regimen: CTX 2g IV q24h End date: 08/30/18  IV antibiotic discharge orders are pended. To discharging provider:  please sign these orders via discharge navigator,  Select New Orders & click on the button choice - Manage This Unsigned Work.     Thank you for allowing pharmacy to be a part of this patient's care.  Luisa Hart D 08/19/2018, 11:26 AM

## 2018-08-19 NOTE — Transfer of Care (Signed)
Immediate Anesthesia Transfer of Care Note  Patient: Kaitlyn Johnson  Procedure(s) Performed: ESOPHAGOGASTRODUODENOSCOPY (EGD) WITH PROPOFOL (N/A )  Patient Location: PACU and Endoscopy Unit  Anesthesia Type:MAC  Level of Consciousness: awake and alert   Airway & Oxygen Therapy: Patient Spontanous Breathing and Patient connected to nasal cannula oxygen  Post-op Assessment: Report given to RN and Post -op Vital signs reviewed and stable  Post vital signs: Reviewed and stable  Last Vitals:  Vitals Value Taken Time  BP    Temp    Pulse    Resp    SpO2      Last Pain:  Vitals:   08/19/18 1335  TempSrc: Oral  PainSc: 2       Patients Stated Pain Goal: 2 (72/76/18 4859)  Complications: No apparent anesthesia complications

## 2018-08-19 NOTE — Progress Notes (Signed)
Patient ID: Kaitlyn Johnson, female   DOB: October 03, 1981, 37 y.o.   MRN: [id number]   Mild gastritis seen in sleeve gastrectomy. No gastric outlet obstruction. See endopro note for details. Since she has allergies to yellow dye and other components of Protonix would keep her on IV Pepcid. Clear liquid diet.

## 2018-08-19 NOTE — Progress Notes (Signed)
[facility] [place] [facility] Progress Note     Subjective: CC-  No changes. Continues to complain of epigastric abdominal pain. States that she had several loose BMs yesterday. Some nausea, no emesis.  CT abdomen/pelvis yesterday negative for any acute issues.  Objective: Vital signs in last 24 hours: Temp:  [97.7 F (36.5 C)-98.7 F (37.1 C)] 98.2 F (36.8 C) (10/22 0524) Pulse Rate:  [67-82] 67 (10/22 0524) Resp:  [18] 18 (10/22 0524) BP: (118-128)/(76-96) 121/78 (10/22 0524) SpO2:  [98 %-100 %] 98 % (10/21 2137) Last BM Date: 08/18/18  Intake/Output from previous day: 10/21 0701 - 10/22 0700 In: 3320.1 [P.O.:180; I.V.:2940; IV Piggyback:200.1] Out: 3150 [Urine:3150] Intake/Output this shift: No intake/output data recorded.  PE: Gen:  Alert, NAD, pleasant HEENT: EOM's intact, pupils equal and round Card:  RRR Pulm:  CTAB, no W/R/R, effort normal Abd: Soft, mild TTP epigastric region, +BS, lap incisions cdi Psych: A&Ox3  Skin: no rashes noted, warm and dry  Lab Results:  Recent Labs    08/18/18 0425 08/19/18 0402  WBC 2.8* 3.2*  HGB 12.2 11.4*  HCT 37.8 36.0  PLT 126* 150   BMET Recent Labs    08/18/18 0425 08/19/18 0402  NA 138 139  K 3.2* 3.2*  CL 107 107  CO2 24 25  GLUCOSE 129* 123*  BUN <5* <5*  CREATININE 0.64 0.63  CALCIUM 8.3* 8.3*   PT/INR No results for input(s): LABPROT, INR in the last 72 hours. CMP     Component Value Date/Time   NA 139 08/19/2018 0402   NA 138 09/07/2013 1409   K 3.2 (L) 08/19/2018 0402   K 3.7 09/07/2013 1409   CL 107 08/19/2018 0402   CL 102 11/11/2012 1324   CO2 25 08/19/2018 0402   CO2 23 09/07/2013 1409   GLUCOSE 123 (H) 08/19/2018 0402   GLUCOSE 108 09/07/2013 1409   GLUCOSE 85 11/11/2012 1324   BUN <5 (L) 08/19/2018 0402   BUN 12.4 09/07/2013 1409   CREATININE 0.63 08/19/2018 0402   CREATININE 0.8 09/07/2013 1409   CALCIUM 8.3 (L) 08/19/2018 0402   CALCIUM 9.4 09/07/2013 1409   PROT 6.5 08/19/2018  0402   PROT 7.1 09/07/2013 1409   ALBUMIN 3.4 (L) 08/19/2018 0402   ALBUMIN 3.6 09/07/2013 1409   AST 36 08/19/2018 0402   AST 14 09/07/2013 1409   ALT 30 08/19/2018 0402   ALT 28 09/07/2013 1409   ALKPHOS 48 08/19/2018 0402   ALKPHOS 81 09/07/2013 1409   BILITOT 0.5 08/19/2018 0402   BILITOT <0.20 09/07/2013 1409   GFRNONAA >60 08/19/2018 0402   GFRAA >60 08/19/2018 0402   Lipase     Component Value Date/Time   LIPASE 33 08/16/2018 0325       Studies/Results: Ct Abdomen Pelvis W Contrast  Result Date: 08/18/2018 CLINICAL DATA:  Diffuse abdominal pain, nausea and vomiting, and fever. One month postop from gastric sleeve resection. Endometriosis with previous hysterectomy and appendectomy. EXAM: CT ABDOMEN AND PELVIS WITH CONTRAST TECHNIQUE: Multidetector CT imaging of the abdomen and pelvis was performed using the standard protocol following bolus administration of intravenous contrast. CONTRAST:  139m OMNIPAQUE IOHEXOL 300 MG/ML SOLN, 321mOMNIPAQUE IOHEXOL 300 MG/ML SOLN COMPARISON:  02/28/2017 FINDINGS: Lower Chest: No acute findings. Hepatobiliary: No hepatic masses identified. Focal fatty infiltration again seen adjacent to the falciform ligament. Prior cholecystectomy. No evidence of biliary obstruction. Pancreas:  No mass or inflammatory changes. Spleen: Within normal limits in size and appearance. Adrenals/Urinary Tract: No  masses identified. No evidence of hydronephrosis. Stomach/Bowel: Expected postop changes from sleeve gastrectomy. No evidence of obstruction, inflammatory process or abnormal fluid collections. Vascular/Lymphatic: No pathologically enlarged lymph nodes. No abdominal aortic aneurysm. Reproductive: Prior hysterectomy noted. Adnexal regions are unremarkable in appearance. Other:  None. Musculoskeletal:  No suspicious bone lesions identified. IMPRESSION: No acute findings or other significant abnormality identified. Electronically Signed   By: Earle Gell M.D.    On: 08/18/2018 16:05    Anti-infectives: Anti-infectives (From admission, onward)   Start     Dose/Rate Route Frequency Ordered Stop   08/17/18 0800  cefTRIAXone (ROCEPHIN) 2 g in sodium chloride 0.9 % 100 mL IVPB     2 g 200 mL/hr over 30 Minutes Intravenous Every 24 hours 08/17/18 0753         Assessment/Plan Abdominal pain, nausea/vomiting, and dehydration S/p sleeve gastrectomy 07/15/18 Dr. Hassell Done - CT scan 10/21 normal - Discussed with Dr. Hassell Done. Will consult GI for upper endoscopy. Keep NPO in case this is able to be performed today. Ok to resume bariatric clears after procedure.  Bacteremia  - blood cx positive for klebsiella pneumoniae and Enterobacteriaceae. Repeat Bcx pending - appreciate ID consult - started rocephin 10/20  ID - rocephin 10/20>> FEN - IVF, NPO for possible procdure VTE - SCDs, lovenox Foley - none   LOS: 3 days    Wellington Hampshire , [facility] 08/19/2018, 8:05 AM Pager: [phone] Mon 7:00 am -11:30 AM Tues-Fri 7:00 am-4:30 pm Sat-Sun 7:00 am-11:30 am

## 2018-08-19 NOTE — Anesthesia Procedure Notes (Signed)
Procedure Name: MAC Date/Time: 08/19/2018 3:07 PM Performed by: Cynda Familia, CRNA Pre-anesthesia Checklist: Patient identified, Emergency Drugs available, Suction available, Patient being monitored and Timeout performed Patient Re-evaluated:Patient Re-evaluated prior to induction Oxygen Delivery Method: Nasal cannula Placement Confirmation: CO2 detector and breath sounds checked- equal and bilateral Dental Injury: Teeth and Oropharynx as per pre-operative assessment  Comments: Bite block by GI tech

## 2018-08-19 NOTE — Op Note (Signed)
[facility] Patient Name: Kaitlyn Johnson Procedure Date: 08/19/2018 MRN: [id number] Attending MD: Lear Ng , MD Date of Birth: 01/23/1981 CSN: [id number] Age: 37 Admit Type: Inpatient Procedure:                Upper GI endoscopy Indications:              Epigastric abdominal pain, Nausea with vomiting Providers:                Lear Ng, MD, Cleda Daub, RN, Alan Mulder, Technician, Glenis Smoker, CRNA Referring MD:             hospital team Medicines:                Propofol per Anesthesia, Monitored Anesthesia Care Complications:            No immediate complications. Estimated Blood Loss:     Estimated blood loss: none. Procedure:                Pre-Anesthesia Assessment:                           - Prior to the procedure, a History and Physical                            was performed, and patient medications and                            allergies were reviewed. The patient's tolerance of                            previous anesthesia was also reviewed. The risks                            and benefits of the procedure and the sedation                            options and risks were discussed with the patient.                            All questions were answered, and informed consent                            was obtained. Prior Anticoagulants: The patient has                            taken no previous anticoagulant or antiplatelet                            agents. ASA Grade Assessment: III - A patient with                            severe systemic disease. After reviewing the risks  and benefits, the patient was deemed in                            satisfactory condition to undergo the procedure.                           After obtaining informed consent, the endoscope was                            passed under direct vision. Throughout the                             procedure, the patient's blood pressure, pulse, and                            oxygen saturations were monitored continuously. The                            GIF-H190 ([id number]) Olympus Adult Endoscope was                            introduced through the mouth, and advanced to the                            second part of duodenum. The upper GI endoscopy was                            accomplished without difficulty. The patient                            tolerated the procedure fairly well. Scope In: Scope Out: Findings:      The examined esophagus was normal.      The Z-line was regular and was found 40 cm from the incisors.      Evidence of a Roux-en-Y gastrojejunostomy was found. The gastrojejunal       anastomosis was characterized by healthy appearing mucosa. This was       traversed. The pouch-to-jejunum limb was characterized by congestion,       edema and erythema. The jejunojejunal anastomosis was characterized by       healthy appearing mucosa. The duodenum-to-jejunum limb was examined.      Evidence of a sleeve gastrectomy was found in the stomach. This was       characterized by congestion, edema, erythema and inflammation.      The cardia and gastric fundus were otherwise normal on retroflexion.      Patchy mild inflammation characterized by congestion (edema), erosions       and erythema was found in the entire examined stomach. Impression:               - Normal esophagus.                           - Z-line regular, 40 cm from the incisors.                           - Roux-en-Y gastrojejunostomy  with gastrojejunal                            anastomosis characterized by healthy appearing                            mucosa.                           - A sleeve gastrectomy was found, characterized by                            inflammation, congestion, edema and erythema.                           - Acute gastritis.                           - No specimens  collected. Moderate Sedation:      N/A- Per Anesthesia Care Recommendation:           - Clear liquid diet.                           - Observe patient's clinical course.                           - Use Protonix (pantoprazole) 40 mg IV daily. Procedure Code(s):        --- Professional ---                           [id number], Esophagogastroduodenoscopy, flexible,                            transoral; diagnostic, including collection of                            specimen(s) by brushing or washing, when performed                            (separate procedure) Diagnosis Code(s):        --- Professional ---                           R10.13, Epigastric pain                           R11.2, Nausea with vomiting, unspecified                           K29.00, Acute gastritis without bleeding                           Z98.0, Intestinal bypass and anastomosis status                           Z98.84, Bariatric surgery status CPT copyright 2018 [employer]. All rights reserved. The codes documented in this report are preliminary and upon coder  review may  be revised to meet current compliance requirements. Lear Ng, MD 08/19/2018 3:54:06 PM This report has been signed electronically. Number of Addenda: 0

## 2018-08-20 ENCOUNTER — Encounter (HOSPITAL_COMMUNITY): Payer: Self-pay | Admitting: Gastroenterology

## 2018-08-20 LAB — CBC WITH DIFFERENTIAL/PLATELET
ABS IMMATURE GRANULOCYTES: 0.01 10*3/uL (ref 0.00–0.07)
Basophils Absolute: 0 10*3/uL (ref 0.0–0.1)
Basophils Relative: 0 %
Eosinophils Absolute: 0.1 10*3/uL (ref 0.0–0.5)
Eosinophils Relative: 3 %
HEMATOCRIT: 36.6 % (ref 36.0–46.0)
HEMOGLOBIN: 11.6 g/dL — AB (ref 12.0–15.0)
Immature Granulocytes: 0 %
LYMPHS ABS: 1.4 10*3/uL (ref 0.7–4.0)
LYMPHS PCT: 43 %
MCH: 30.4 pg (ref 26.0–34.0)
MCHC: 31.7 g/dL (ref 30.0–36.0)
MCV: 95.8 fL (ref 80.0–100.0)
MONO ABS: 0.4 10*3/uL (ref 0.1–1.0)
MONOS PCT: 13 %
Neutro Abs: 1.3 10*3/uL — ABNORMAL LOW (ref 1.7–7.7)
Neutrophils Relative %: 41 %
Platelets: 173 10*3/uL (ref 150–400)
RBC: 3.82 MIL/uL — ABNORMAL LOW (ref 3.87–5.11)
RDW: 13.1 % (ref 11.5–15.5)
WBC: 3.3 10*3/uL — ABNORMAL LOW (ref 4.0–10.5)
nRBC: 0 % (ref 0.0–0.2)

## 2018-08-20 LAB — COMPREHENSIVE METABOLIC PANEL
ALBUMIN: 3.4 g/dL — AB (ref 3.5–5.0)
ALT: 30 U/L (ref 0–44)
AST: 27 U/L (ref 15–41)
Alkaline Phosphatase: 50 U/L (ref 38–126)
Anion gap: 9 (ref 5–15)
CHLORIDE: 104 mmol/L (ref 98–111)
CO2: 27 mmol/L (ref 22–32)
CREATININE: 0.62 mg/dL (ref 0.44–1.00)
Calcium: 8.4 mg/dL — ABNORMAL LOW (ref 8.9–10.3)
GFR calc Af Amer: >60 mL/min (ref >60)
GFR calc non Af Amer: >60 mL/min (ref >60)
GLUCOSE: 121 mg/dL — AB (ref 70–99)
Potassium: 3.5 mmol/L (ref 3.5–5.1)
Sodium: 140 mmol/L (ref 135–145)
Total Bilirubin: 0.6 mg/dL (ref 0.3–1.2)
Total Protein: 6.8 g/dL (ref 6.5–8.1)

## 2018-08-20 MED ORDER — PANTOPRAZOLE SODIUM 40 MG IV SOLR
40.0000 mg | Freq: Two times a day (BID) | INTRAVENOUS | Status: DC
  Administered 2018-08-20 – 2018-08-24 (×10): 40 mg via INTRAVENOUS
  Filled 2018-08-20 (×11): qty 40

## 2018-08-20 MED ORDER — SUCRALFATE 1 GM/10ML PO SUSP
1.0000 g | Freq: Three times a day (TID) | ORAL | Status: DC
  Administered 2018-08-20 – 2018-08-25 (×18): 1 g via ORAL
  Filled 2018-08-20 (×19): qty 10

## 2018-08-20 MED ORDER — PROMETHAZINE HCL 25 MG/ML IJ SOLN
6.2500 mg | Freq: Four times a day (QID) | INTRAMUSCULAR | Status: DC | PRN
  Administered 2018-08-20 – 2018-08-24 (×9): 6.25 mg via INTRAVENOUS
  Filled 2018-08-20 (×10): qty 1

## 2018-08-20 MED ORDER — ACETAMINOPHEN 500 MG PO TABS
1000.0000 mg | ORAL_TABLET | Freq: Three times a day (TID) | ORAL | Status: DC
  Administered 2018-08-20 – 2018-08-25 (×11): 1000 mg via ORAL
  Filled 2018-08-20 (×13): qty 2

## 2018-08-20 MED ORDER — HYDROMORPHONE HCL 1 MG/ML IJ SOLN
0.5000 mg | INTRAMUSCULAR | Status: DC | PRN
  Administered 2018-08-20: 1 mg via INTRAVENOUS
  Filled 2018-08-20: qty 1

## 2018-08-20 NOTE — Progress Notes (Signed)
Advanced diet to bari full per Dr Daphine Deutscher.  Continue PPI and Carafate.  No questions at this time

## 2018-08-20 NOTE — Progress Notes (Signed)
[facility] Progress Note  Kaitlyn Johnson 36 y.o. Oct 11, 1981   Subjective: Continues to have focal epigastric pain and felt nauseous this morning after breakfast.  Objective: Vital signs: Vitals:   08/19/18 2156 08/20/18 0606  BP: 131/82 124/82  Pulse: 81 70  Resp: 18 18  Temp: 98.2 F (36.8 C) 97.8 F (36.6 C)  SpO2: 100% 98%    Physical Exam: Gen: alert, no acute distress  HEENT: anicteric sclera CV: RRR Chest: CTA B Abd: epigastric tenderness with minimal guarding, soft, nondistended, +BS Ext: no edema  Lab Results: Recent Labs    08/19/18 0402 08/20/18 0436  NA 139 140  K 3.2* 3.5  CL 107 104  CO2 25 27  GLUCOSE 123* 121*  BUN <5* <5*  CREATININE 0.63 0.62  CALCIUM 8.3* 8.4*   Recent Labs    08/19/18 0402 08/20/18 0436  AST 36 27  ALT 30 30  ALKPHOS 48 50  BILITOT 0.5 0.6  PROT 6.5 6.8  ALBUMIN 3.4* 3.4*   Recent Labs    08/19/18 0402 08/20/18 0436  WBC 3.2* 3.3*  NEUTROABS 1.0* 1.3*  HGB 11.4* 11.6*  HCT 36.0 36.6  MCV 96.3 95.8  PLT 150 173      Assessment/Plan: Mild gastritis in sleeve gastrectomy without gastric outlet obstruction. Atypical for gastritis to cause severe abdominal pain. Agree with IV PPI Q 12 hours for better acid control. Continue Sucralfate. Encouraged PO intake.   Kaitlyn Johnson 08/20/2018, 12:11 PM  Questions please call [phone] ID: Kaitlyn Johnson, female   DOB: 1981-06-04, 37 y.o.   MRN: [id number]

## 2018-08-20 NOTE — Addendum Note (Signed)
Addendum  created 08/20/18 0957 by Elmer Picker, MD   Sign clinical note, SmartForm saved

## 2018-08-20 NOTE — Progress Notes (Signed)
1 Day Post-Op    CC:  Abdominal pain  Subjective: Still complaining of pain and trouble with eating with bariatric full liquids.  All her port sites look good.    Objective: Vital signs in last 24 hours: Temp:  [97.7 F (36.5 C)-98.6 F (37 C)] 97.8 F (36.6 C) (10/23 0606) Pulse Rate:  [65-81] 70 (10/23 0606) Resp:  [12-19] 18 (10/23 0606) BP: (118-131)/(78-85) 124/82 (10/23 0606) SpO2:  [98 %-100 %] 98 % (10/23 0606) Weight:  [110.7 kg] 110.7 kg (10/22 1335) Last BM Date: 08/18/18 240 PO 2236 IV 3500 urine No Bm Afebrile, VSS Labs OK Pain/Nausea Dilaudid 1 mg x 5 yesterday; Zofran x 2; phenergan 12.5 x 3 phenergan 6.25 x 1 Intake/Output from previous day: 10/22 0701 - 10/23 0700 In: 2676.2 [P.O.:240; I.V.:2236.2; IV Piggyback:200] Out: 3500 [Urine:3500] Intake/Output this shift: No intake/output data recorded.  General appearance: alert, cooperative, no distress and anxious Resp: clear to auscultation bilaterally GI: complains of epigastric pain, no distension, + BS. Sites all look fine.  Lab Results:  Recent Labs    08/19/18 0402 08/20/18 0436  WBC 3.2* 3.3*  HGB 11.4* 11.6*  HCT 36.0 36.6  PLT 150 173    BMET Recent Labs    08/19/18 0402 08/20/18 0436  NA 139 140  K 3.2* 3.5  CL 107 104  CO2 25 27  GLUCOSE 123* 121*  BUN <5* <5*  CREATININE 0.63 0.62  CALCIUM 8.3* 8.4*   PT/INR No results for input(s): LABPROT, INR in the last 72 hours.  Recent Labs  Lab 08/16/18 0325 08/17/18 0438 08/18/18 0425 08/19/18 0402 08/20/18 0436  AST 24 23 24  36 27  ALT 26 23 27 30 30   ALKPHOS 64 55 52 48 50  BILITOT 0.5 0.4 0.3 0.5 0.6  PROT 7.3 6.4* 6.5 6.5 6.8  ALBUMIN 4.1 3.3* 3.3* 3.4* 3.4*     Lipase     Component Value Date/Time   LIPASE 33 08/16/2018 0325     Medications: . DULoxetine  30 mg Oral Daily  . enoxaparin (LOVENOX) injection  40 mg Subcutaneous Q24H  . lurasidone  60 mg Oral Q supper  . metoprolol succinate  25 mg Oral Daily   . metoprolol succinate  75 mg Oral BID  . protein supplement shake  2 oz Oral Q4H  . sucralfate  1 g Oral TID WC & HS  . zolpidem  5 mg Oral QHS  . zolpidem  5 mg Oral Once   Anti-infectives (From admission, onward)   Start     Dose/Rate Route Frequency Ordered Stop   08/17/18 0800  cefTRIAXone (ROCEPHIN) 2 g in sodium chloride 0.9 % 100 mL IVPB     2 g 200 mL/hr over 30 Minutes Intravenous Every 24 hours 08/17/18 0753       EGD 08/19/18 - Dr. Michail Sermon: Normal esophagus. - Z-line regular, 40 cm from the incisors. - Roux-en-Y gastrojejunostomy with gastrojejunal anastomosis characterized by healthy appearing mucosa. - A sleeve gastrectomy was found, characterized by inflammation, congestion, edema and erythema. - Acute gastritis. - No specimens collected.    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 Hassell Done - CT scan 10/21 normal - Discussed with Dr. Hassell Done. Will consult GI for upper endoscopy. Keep NPO  in case this is able to be performed today. Ok to resume bariatric clears after procedure.  Bacteremia   - Klebsiella bacteremia  - PICC line (removed 08-17-18) can be replaced after 10/21 blood cultures are negative for 4         Days  - await 10/21 blood cultures and home abx based on those cultures  - ID consult 10/22, Dr. Johnnye Sima ID -rocephin 10/20>> FEN -IVF,  bariatric full liquids VTE -SCDs, lovenox Foley -none   Plan:  I ask Pharmacy to change her PPI to IV for now, I will switch her to PO as soon as I know she is keeping it down.  We started Carafate today also. She has full liquids ordered but she can also have Bariatric clears if she likes.  Appreciate DR.  Hatcher's assistance.  We will continue IV Rocephin for now. Start weaning narcotics.    LOS: 4 days    Conley Pawling 08/20/2018 [phone]

## 2018-08-20 NOTE — Progress Notes (Signed)
[facility]  [facility] will provide Home Infusion Pharmacy and [facility] services with RN for Home IV ABX at DC.  [facility] can also support Home IV hydration at DC if CCS team and pt desires/orders in order to keep pt at home.    If patient discharges after hours, please call [phone].   [name] 08/20/2018, 11:45 AM

## 2018-08-21 LAB — CULTURE, BLOOD (ROUTINE X 2)
CULTURE: NO GROWTH
Special Requests: ADEQUATE

## 2018-08-21 LAB — CBC WITH DIFFERENTIAL/PLATELET
Abs Immature Granulocytes: 0.01 10*3/uL (ref 0.00–0.07)
BASOS ABS: 0 10*3/uL (ref 0.0–0.1)
Basophils Relative: 1 %
EOS ABS: 0.1 10*3/uL (ref 0.0–0.5)
Eosinophils Relative: 4 %
HEMATOCRIT: 35.3 % — AB (ref 36.0–46.0)
HEMOGLOBIN: 11.1 g/dL — AB (ref 12.0–15.0)
IMMATURE GRANULOCYTES: 0 %
LYMPHS ABS: 1.8 10*3/uL (ref 0.7–4.0)
Lymphocytes Relative: 44 %
MCH: 30.2 pg (ref 26.0–34.0)
MCHC: 31.4 g/dL (ref 30.0–36.0)
MCV: 95.9 fL (ref 80.0–100.0)
MONOS PCT: 11 %
Monocytes Absolute: 0.4 10*3/uL (ref 0.1–1.0)
NEUTROS PCT: 40 %
NRBC: 0 % (ref 0.0–0.2)
Neutro Abs: 1.6 10*3/uL — ABNORMAL LOW (ref 1.7–7.7)
Platelets: 179 10*3/uL (ref 150–400)
RBC: 3.68 MIL/uL — ABNORMAL LOW (ref 3.87–5.11)
RDW: 13 % (ref 11.5–15.5)
WBC: 4 10*3/uL (ref 4.0–10.5)

## 2018-08-21 LAB — COMPREHENSIVE METABOLIC PANEL
ALBUMIN: 3.2 g/dL — AB (ref 3.5–5.0)
ALT: 39 U/L (ref 0–44)
ANION GAP: 8 (ref 5–15)
AST: 44 U/L — ABNORMAL HIGH (ref 15–41)
Alkaline Phosphatase: 59 U/L (ref 38–126)
BUN: <5 mg/dL — ABNORMAL LOW (ref 6–20)
CHLORIDE: 108 mmol/L (ref 98–111)
CO2: 25 mmol/L (ref 22–32)
Calcium: 8.5 mg/dL — ABNORMAL LOW (ref 8.9–10.3)
Creatinine, Ser: 0.59 mg/dL (ref 0.44–1.00)
GFR calc non Af Amer: >60 mL/min (ref >60)
GLUCOSE: 110 mg/dL — AB (ref 70–99)
Potassium: 3.4 mmol/L — ABNORMAL LOW (ref 3.5–5.1)
SODIUM: 141 mmol/L (ref 135–145)
Total Bilirubin: 0.3 mg/dL (ref 0.3–1.2)
Total Protein: 6.2 g/dL — ABNORMAL LOW (ref 6.5–8.1)

## 2018-08-21 MED ORDER — PRO-STAT SUGAR FREE PO LIQD
30.0000 mL | Freq: Two times a day (BID) | ORAL | Status: DC
  Administered 2018-08-21: 30 mL via ORAL
  Filled 2018-08-21 (×7): qty 30

## 2018-08-21 MED ORDER — HYDROMORPHONE HCL 1 MG/ML IJ SOLN
0.5000 mg | INTRAMUSCULAR | Status: DC | PRN
  Administered 2018-08-22 – 2018-08-23 (×5): 1 mg via INTRAVENOUS
  Filled 2018-08-21 (×5): qty 1

## 2018-08-21 MED ORDER — CLONAZEPAM 1 MG PO TABS
1.0000 mg | ORAL_TABLET | Freq: Every day | ORAL | Status: DC | PRN
  Administered 2018-08-21 – 2018-08-24 (×4): 1 mg via ORAL
  Filled 2018-08-21 (×5): qty 1

## 2018-08-21 NOTE — Progress Notes (Addendum)
Initial Nutrition Assessment  DOCUMENTATION CODES:   Obesity unspecified  INTERVENTION:    Provide Premier Protein BID, each supplement provides 160kcal and 30g protein  Bariatric approved snacks BID  30 ml Prostat BID, each supplement provides 100 kcals and 15 grams protein.   Bariatric MVI,  500 mg Calcium TID   48-hour calorie count   Recommend checking phosphorus and magnesium as pt is risk for refeeding.   NUTRITION DIAGNOSIS:   Inadequate oral intake related to poor appetite as evidenced by per patient/family report.  GOAL:   Patient will meet greater than or equal to 90% of their needs  MONITOR:   PO intake, Supplement acceptance, Labs, Weight trends, I & O's  REASON FOR ASSESSMENT:   Consult Assessment of nutrition requirement/status, Calorie Count  ASSESSMENT:   Patient with PMH significant for anxiety, Cushing's syndrome, celiac disease, hiatal hernia, Sphincter of Oddi dysfunction, and s/p sleeve gastrectomy (07/15/18). Presents this admission with persistent nausea, vomiting, and epigastric pain. Pt with PICC line for hydration and was found to have Klebsiella pneumona bacteremia.    10/20- PICC removed  10/22- upper endoscopy showed mild gastritis seen in sleeve gastrectomy  10/24- diet advanced to bariatric advanced  Pt endorses having a significant decrease in appetite 1.5 weeks post-op due to ongoing nausea and epigastric pain. States since that time she tolerated a couple sips of premier protein and 1 oz of fish daily. She describes fear associated with eating because she does not want to feel pain. When asked about hydration pt claims she had a hard time getting in 10 oz daily. Per outpatient RD note, pt has underwent multiple IV hydrations. Pt reports she has been compliant with her multivitamin and calcium supplements since surgery (1 Procare daily, 3-4 TUMS).   During this admission pt has attempted Vitamin Water Zero and premier protein. She  endorses having a burning sensation in her esophagus upon eating. Discussed importance of PO intake and meeting protein needs this admission. Pt amendable to trying Prostat. Went over entire hospital menu to decide on snack options she could choose to maximize calories and protein.   Weight noted to decrease from 262 lb on 07/15/18 to 244 lb this admission (6.9% wt loss in once month). Weight loss is to be expected after surgery but with dietary recall suspect pt could be malnourished. Nutrition-Focused physical exam completed.   A calorie count envelope was placed on the patient's door. Please document percent consumed for each item on the patient's meal tray ticket/snacks and keep in envelope.   Medications reviewed and include: Carafate, Protonix, D5 @ 100 ml/hr Labs reviewed: K 3.4 (L)   NUTRITION - FOCUSED PHYSICAL EXAM:    Most Recent Value  Orbital Region  No depletion  Upper Arm Region  No depletion  Thoracic and Lumbar Region  No depletion  Buccal Region  No depletion  Temple Region  No depletion  Clavicle Bone Region  No depletion  Clavicle and Acromion Bone Region  No depletion  Scapular Bone Region  No depletion  Dorsal Hand  No depletion  Patellar Region  No depletion  Anterior Thigh Region  No depletion  Posterior Calf Region  No depletion  Edema (RD Assessment)  None     Diet Order:   Diet Order            Diet bariatric advanced Room service appropriate? Yes; Fluid consistency: Thin  Diet effective now  EDUCATION NEEDS:   Education needs have been addressed  Skin:  Skin Assessment: Reviewed RN Assessment  Last BM:  08/18/18  Height:   Ht Readings from Last 1 Encounters:  08/19/18 5\' 8"  (1.727 m)    Weight:   Wt Readings from Last 1 Encounters:  08/19/18 110.7 kg    Ideal Body Weight:  63.6 kg  BMI:  Body mass index is 37.11 kg/m.  Estimated Nutritional Needs:   Kcal:  1600-1800 kcal  Protein:  >/= 60 grams  Fluid:  >/= 1.6  L/day   Vanessa Kick RD, LDN Clinical Nutrition Pager # - [phone]

## 2018-08-21 NOTE — Progress Notes (Signed)
[facility] Progress Note  2 Days Post-Op  Subjective: CC-  Per patient Dr. Hassell Done was in earlier this morning. Diet was advanced. States that she continues to have epigastric abdominal pain and nausea. About the same as yesterday. No emesis.  Also states that she "feels out of her head" this morning. Denies headache, blurry vision, focal weakness, no slurred speech. VSS and labs fairly normal. Hold narcotics.   Objective: Vital signs in last 24 hours: Temp:  [97.8 F (36.6 C)-98.2 F (36.8 C)] 97.9 F (36.6 C) (10/24 0854) Pulse Rate:  [61-74] 74 (10/24 0854) Resp:  [16-18] 16 (10/24 0854) BP: (121-141)/(55-94) 140/93 (10/24 0854) SpO2:  [95 %-99 %] 98 % (10/24 0854) Last BM Date: 08/18/18  Intake/Output from previous day: 10/23 0701 - 10/24 0700 In: 1965.7 [P.O.:600; I.V.:1222.7; IV Piggyback:142.9] Out: 2450 [Urine:2450] Intake/Output this shift: No intake/output data recorded.  PE: Gen: Alert, NAD, pleasant HEENT: EOM's intact, pupils equal and round Card: RRR Pulm: CTAB, no W/R/R, effort normal Abd: Soft,ND, NT, +BS,lap incisions cdi Psych: A&Ox3  Skin: no rashes noted, warm and dry  Lab Results:  Recent Labs    08/20/18 0436 08/21/18 0422  WBC 3.3* 4.0  HGB 11.6* 11.1*  HCT 36.6 35.3*  PLT 173 179   BMET Recent Labs    08/20/18 0436 08/21/18 0422  NA 140 141  K 3.5 3.4*  CL 104 108  CO2 27 25  GLUCOSE 121* 110*  BUN <5* <5*  CREATININE 0.62 0.59  CALCIUM 8.4* 8.5*   PT/INR No results for input(s): LABPROT, INR in the last 72 hours. CMP     Component Value Date/Time   NA 141 08/21/2018 0422   NA 138 09/07/2013 1409   K 3.4 (L) 08/21/2018 0422   K 3.7 09/07/2013 1409   CL 108 08/21/2018 0422   CL 102 11/11/2012 1324   CO2 25 08/21/2018 0422   CO2 23 09/07/2013 1409   GLUCOSE 110 (H) 08/21/2018 0422   GLUCOSE 108 09/07/2013 1409   GLUCOSE 85 11/11/2012 1324   BUN <5 (L) 08/21/2018 0422   BUN 12.4 09/07/2013 1409   CREATININE 0.59 08/21/2018 0422   CREATININE 0.8 09/07/2013 1409   CALCIUM 8.5 (L) 08/21/2018 0422   CALCIUM 9.4 09/07/2013 1409   PROT 6.2 (L) 08/21/2018 0422   PROT 7.1 09/07/2013 1409   ALBUMIN 3.2 (L) 08/21/2018 0422   ALBUMIN 3.6 09/07/2013 1409   AST 44 (H) 08/21/2018 0422   AST 14 09/07/2013 1409   ALT 39 08/21/2018 0422   ALT 28 09/07/2013 1409   ALKPHOS 59 08/21/2018 0422   ALKPHOS 81 09/07/2013 1409   BILITOT 0.3 08/21/2018 0422   BILITOT <0.20 09/07/2013 1409   GFRNONAA >60 08/21/2018 0422   GFRAA >60 08/21/2018 0422   Lipase     Component Value Date/Time   LIPASE 33 08/16/2018 0325       Studies/Results: No results found.  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 Hassell Done -CT scan 10/21 normal - EGD 10/22 mild gastritis, no GOO  Bacteremia   - Klebsiella bacteremia  -  PICC line (removed 08-17-18) can be replaced after 10/21 blood cultures are negative for 4 days  - await 10/21 blood cultures and home abx based on those cultures - appreciate Dr. Johnnye Sima assistance  ID -rocephin 10/20>> FEN -IVF @ 121m/hr, bariatric advanced VTE -SCDs, lovenox Foley -none   Plan:  Patient seen by Dr. MHassell Donethis morning and was advanced to bariatric advanced diet. Encourage PO intake. Continue IV PPI q12 and carafate. Will ask dietician to see for calorie count, assessment of nutrition requirements/status.  If 10/21 blood cultures negative tomorrow will be able to replace PICC at that time.   LOS: 5 days    BWellington Hampshire, PWar [facility] HospitalSur[facility] 08/21/2018, 9:13 AM Pager:  [phone] 7:00 am -11:30 AM Tues-Fri 7:00 am-4:30 pm Sat-Sun 7:00 am-11:30 am

## 2018-08-21 NOTE — Progress Notes (Signed)
This CM spoke with [facility] infusion rep about home infusion needs at discharge. Will need MD orders for [facility] and script for DC. Sandford Craze RN,BSN [phone]

## 2018-08-21 NOTE — Progress Notes (Signed)
Rounded with Dr Daphine Deutscher this am.  Patient drinking clear fluids, did not like premiere protein shake.  Will try unjury chicken soup protein shake.  Patient was tolerating fish at home.  Increased diet to incorporate this food per Dr Daphine Deutscher.  Options discussed with patient.  Dietitian consult placed.  Questions answered.

## 2018-08-21 NOTE — Progress Notes (Signed)
Multiple attempts made in offering patient something to drink. Educated about needing to take in more protein. Patient states she either does not like the taste or that she is nauseous. Will continue to monitor.

## 2018-08-22 ENCOUNTER — Inpatient Hospital Stay: Payer: Self-pay

## 2018-08-22 LAB — COMPREHENSIVE METABOLIC PANEL
ALBUMIN: 3.6 g/dL (ref 3.5–5.0)
ALT: 43 U/L (ref 0–44)
ANION GAP: 8 (ref 5–15)
AST: 37 U/L (ref 15–41)
Alkaline Phosphatase: 62 U/L (ref 38–126)
BUN: <5 mg/dL — ABNORMAL LOW (ref 6–20)
CHLORIDE: 110 mmol/L (ref 98–111)
CO2: 23 mmol/L (ref 22–32)
Calcium: 8.5 mg/dL — ABNORMAL LOW (ref 8.9–10.3)
Creatinine, Ser: 0.5 mg/dL (ref 0.44–1.00)
GFR calc Af Amer: >60 mL/min (ref >60)
GFR calc non Af Amer: >60 mL/min (ref >60)
GLUCOSE: 103 mg/dL — AB (ref 70–99)
POTASSIUM: 3.1 mmol/L — AB (ref 3.5–5.1)
SODIUM: 141 mmol/L (ref 135–145)
Total Bilirubin: 0.5 mg/dL (ref 0.3–1.2)
Total Protein: 6.7 g/dL (ref 6.5–8.1)

## 2018-08-22 LAB — CBC WITH DIFFERENTIAL/PLATELET
ABS IMMATURE GRANULOCYTES: 0.02 10*3/uL (ref 0.00–0.07)
BASOS PCT: 0 %
Basophils Absolute: 0 10*3/uL (ref 0.0–0.1)
Eosinophils Absolute: 0.1 10*3/uL (ref 0.0–0.5)
Eosinophils Relative: 2 %
HEMATOCRIT: 35.3 % — AB (ref 36.0–46.0)
HEMOGLOBIN: 11.1 g/dL — AB (ref 12.0–15.0)
Immature Granulocytes: 0 %
LYMPHS PCT: 30 %
Lymphs Abs: 1.6 10*3/uL (ref 0.7–4.0)
MCH: 30.1 pg (ref 26.0–34.0)
MCHC: 31.4 g/dL (ref 30.0–36.0)
MCV: 95.7 fL (ref 80.0–100.0)
MONO ABS: 0.5 10*3/uL (ref 0.1–1.0)
Monocytes Relative: 9 %
NEUTROS ABS: 3.2 10*3/uL (ref 1.7–7.7)
NRBC: 0 % (ref 0.0–0.2)
Neutrophils Relative %: 59 %
PLATELETS: 228 10*3/uL (ref 150–400)
RBC: 3.69 MIL/uL — AB (ref 3.87–5.11)
RDW: 12.9 % (ref 11.5–15.5)
WBC: 5.5 10*3/uL (ref 4.0–10.5)

## 2018-08-22 LAB — MAGNESIUM: Magnesium: 2.1 mg/dL (ref 1.7–2.4)

## 2018-08-22 LAB — PHOSPHORUS: Phosphorus: 2.6 mg/dL (ref 2.5–4.6)

## 2018-08-22 MED ORDER — SODIUM CHLORIDE 0.9% FLUSH
10.0000 mL | INTRAVENOUS | Status: DC | PRN
  Administered 2018-08-25: 10 mL
  Filled 2018-08-22: qty 40

## 2018-08-22 MED ORDER — POTASSIUM CHLORIDE 10 MEQ/100ML IV SOLN
10.0000 meq | INTRAVENOUS | Status: AC
  Administered 2018-08-22 – 2018-08-23 (×6): 10 meq via INTRAVENOUS
  Filled 2018-08-22 (×6): qty 100

## 2018-08-22 MED ORDER — INSULIN ASPART 100 UNIT/ML ~~LOC~~ SOLN: 0.0000 [IU] | Freq: Three times a day (TID) | SUBCUTANEOUS | Status: DC

## 2018-08-22 NOTE — Progress Notes (Signed)
INFECTIOUS DISEASE PROGRESS NOTE  ID: Kaitlyn Johnson is a 37 y.o. female with  Active Problems:   Dehydration  Subjective: Had n/v this AM.  Tearful, would like to go home  Abtx:  Anti-infectives (From admission, onward)   Start     Dose/Rate Route Frequency Ordered Stop   08/17/18 0800  cefTRIAXone (ROCEPHIN) 2 g in sodium chloride 0.9 % 100 mL IVPB     2 g 200 mL/hr over 30 Minutes Intravenous Every 24 hours 08/17/18 0753        Medications:  Scheduled: . acetaminophen  1,000 mg Oral Q8H  . DULoxetine  30 mg Oral Daily  . enoxaparin (LOVENOX) injection  40 mg Subcutaneous Q24H  . feeding supplement (PRO-STAT SUGAR FREE 64)  30 mL Oral BID  . lurasidone  60 mg Oral Q supper  . metoprolol succinate  25 mg Oral Daily  . metoprolol succinate  75 mg Oral BID  . pantoprazole (PROTONIX) IV  40 mg Intravenous Q12H  . protein supplement shake  2 oz Oral Q4H  . sucralfate  1 g Oral TID WC & HS  . zolpidem  5 mg Oral Once    Objective: Vital signs in last 24 hours: Temp:  [97.7 F (36.5 C)-98.5 F (36.9 C)] 98.5 F (36.9 C) (10/25 0454) Pulse Rate:  [58-74] 64 (10/25 0454) Resp:  [18] 18 (10/25 0454) BP: (118-157)/(67-99) 157/99 (10/25 1204) SpO2:  [99 %-100 %] 99 % (10/25 0454)   General appearance: alert, cooperative and mild distress Resp: clear to auscultation bilaterally Cardio: regular rate and rhythm GI: normal findings: bowel sounds normal and soft, non-tender  Lab Results Recent Labs    08/20/18 0436 08/21/18 0422 08/22/18 0346  WBC 3.3* 4.0 5.5  HGB 11.6* 11.1* 11.1*  HCT 36.6 35.3* 35.3*  NA 140 141  --   K 3.5 3.4*  --   CL 104 108  --   CO2 27 25  --   BUN <5* <5*  --   CREATININE 0.62 0.59  --    Liver Panel Recent Labs    08/20/18 0436 08/21/18 0422  PROT 6.8 6.2*  ALBUMIN 3.4* 3.2*  AST 27 44*  ALT 30 39  ALKPHOS 50 59  BILITOT 0.6 0.3   Sedimentation Rate No results for input(s): ESRSEDRATE in the last 72  hours. C-Reactive Protein No results for input(s): CRP in the last 72 hours.  Microbiology: Recent Results (from the past 240 hour(s))  Urine culture     Status: Abnormal   Collection Time: 08/16/18  3:54 AM  Result Value Ref Range Status   Specimen Description   Final    URINE, RANDOM Performed at [facility], [address], [place], [place] [address]    Special Requests   Final    NONE Performed at [facility], [address] [place], [place] [address]    Culture (A)  Final    <10,000 COLONIES/mL INSIGNIFICANT GROWTH Performed at [facility], [address] [place], [place] [address]    Report Status 08/17/2018 FINAL  Final  Culture, blood (routine x 2)     Status: None   Collection Time: 08/16/18  8:12 AM  Result Value Ref Range Status   Specimen Description   Final    BLOOD RIGHT ARM Performed at [facility], [address] [place], [place] [address]    Special Requests   Final    BOTTLES DRAWN  AEROBIC AND ANAEROBIC Blood Culture adequate volume Performed at [facility], [address] [place], [place] [address]    Culture   Final    NO GROWTH 5 DAYS Performed at [facility], [address], [place], [place] [address]    Report Status 08/21/2018 FINAL  Final  Culture, blood (routine x 2)     Status: Abnormal   Collection Time: 08/16/18  8:12 AM  Result Value Ref Range Status   Specimen Description   Final    BLOOD LEFT HAND Performed at [facility], [address] [place], [place] [address]    Special Requests   Final    BOTTLES DRAWN AEROBIC AND ANAEROBIC Blood Culture adequate volume Performed at [facility], [address] [place], [place] [address]    Culture  Setup Time   Final    GRAM NEGATIVE RODS IN BOTH AEROBIC AND ANAEROBIC BOTTLES Organism ID to follow CRITICAL RESULT CALLED TO, READ BACK BY AND VERIFIED WITH:  T. GREEN PHARMD, AT 0734 08/17/18 BY Renato Shin Performed at [facility], [address] [place], [place] [address]    Culture KLEBSIELLA PNEUMONIAE (A)  Final   Report Status 08/19/2018 FINAL  Final   Organism ID, Bacteria KLEBSIELLA PNEUMONIAE  Final      Susceptibility   Klebsiella pneumoniae - MIC*    AMPICILLIN >=32 RESISTANT Resistant     CEFAZOLIN <=4 SENSITIVE Sensitive     CEFEPIME <=1 SENSITIVE Sensitive     CEFTAZIDIME <=1 SENSITIVE Sensitive     CEFTRIAXONE <=1 SENSITIVE Sensitive     CIPROFLOXACIN <=0.25 SENSITIVE Sensitive     GENTAMICIN <=1 SENSITIVE Sensitive     IMIPENEM <=0.25 SENSITIVE Sensitive     TRIMETH/SULFA <=20 SENSITIVE Sensitive     AMPICILLIN/SULBACTAM 4 SENSITIVE Sensitive     PIP/TAZO <=4 SENSITIVE Sensitive     * KLEBSIELLA PNEUMONIAE  Blood Culture ID Panel (Reflexed)     Status: Abnormal   Collection Time: 08/16/18  8:12 AM  Result Value Ref Range Status   Enterococcus species NOT DETECTED NOT DETECTED Final   Listeria monocytogenes NOT DETECTED NOT DETECTED Final   Staphylococcus species NOT DETECTED NOT DETECTED Final   Staphylococcus aureus (BCID) NOT DETECTED NOT DETECTED Final   Streptococcus species NOT DETECTED NOT DETECTED Final   Streptococcus agalactiae NOT DETECTED NOT DETECTED Final   Streptococcus pneumoniae NOT DETECTED NOT DETECTED Final   Streptococcus pyogenes NOT DETECTED NOT DETECTED Final   Acinetobacter baumannii NOT DETECTED NOT DETECTED Final   Enterobacteriaceae species DETECTED (A) NOT DETECTED Final    Comment: Enterobacteriaceae represent a large family of gram-negative bacteria, not a single organism. CRITICAL RESULT CALLED TO, READ BACK BY AND VERIFIED WITH: T. GREEN PHARMD, AT 0734 08/17/18 BY D. VANHOOK    Enterobacter cloacae complex NOT DETECTED NOT DETECTED Final   Escherichia coli NOT DETECTED NOT DETECTED Final   Klebsiella oxytoca NOT DETECTED NOT DETECTED Final   Klebsiella pneumoniae DETECTED (A)  NOT DETECTED Final    Comment: CRITICAL RESULT CALLED TO, READ BACK BY AND VERIFIED WITH: T. GREEN PHARMD, AT 1308 08/17/18 BY D. VANHOOK    Proteus species NOT DETECTED NOT DETECTED Final   Serratia marcescens NOT DETECTED NOT DETECTED Final   Carbapenem resistance NOT DETECTED NOT DETECTED Final   Haemophilus influenzae NOT DETECTED NOT DETECTED Final   Neisseria meningitidis NOT DETECTED NOT DETECTED Final   Pseudomonas aeruginosa NOT DETECTED NOT DETECTED  Final   Candida albicans NOT DETECTED NOT DETECTED Final   Candida glabrata NOT DETECTED NOT DETECTED Final   Candida krusei NOT DETECTED NOT DETECTED Final   Candida parapsilosis NOT DETECTED NOT DETECTED Final   Candida tropicalis NOT DETECTED NOT DETECTED Final    Comment: Performed at [facility], [address], [place], [place] [address]  Gastrointestinal Panel by PCR , Stool     Status: None   Collection Time: 08/17/18  7:00 AM  Result Value Ref Range Status   Campylobacter species NOT DETECTED NOT DETECTED Final   Plesimonas shigelloides NOT DETECTED NOT DETECTED Final   Salmonella species NOT DETECTED NOT DETECTED Final   Yersinia enterocolitica NOT DETECTED NOT DETECTED Final   Vibrio species NOT DETECTED NOT DETECTED Final   Vibrio cholerae NOT DETECTED NOT DETECTED Final   Enteroaggregative E coli (EAEC) NOT DETECTED NOT DETECTED Final   Enteropathogenic E coli (EPEC) NOT DETECTED NOT DETECTED Final   Enterotoxigenic E coli (ETEC) NOT DETECTED NOT DETECTED Final   Shiga like toxin producing E coli (STEC) NOT DETECTED NOT DETECTED Final   Shigella/Enteroinvasive E coli (EIEC) NOT DETECTED NOT DETECTED Final   Cryptosporidium NOT DETECTED NOT DETECTED Final   Cyclospora cayetanensis NOT DETECTED NOT DETECTED Final   Entamoeba histolytica NOT DETECTED NOT DETECTED Final   Giardia lamblia NOT DETECTED NOT DETECTED Final   Adenovirus F40/41 NOT DETECTED NOT DETECTED Final   Astrovirus NOT DETECTED NOT DETECTED  Final   Norovirus GI/GII NOT DETECTED NOT DETECTED Final   Rotavirus A NOT DETECTED NOT DETECTED Final   Sapovirus (I, II, IV, and V) NOT DETECTED NOT DETECTED Final    Comment: Performed at [facility], [address] [place], [place] [address]  Culture, blood (Routine X 2) w Reflex to ID Panel     Status: None (Preliminary result)   Collection Time: 08/18/18  1:52 PM  Result Value Ref Range Status   Specimen Description   Final    BLOOD LEFT HAND Performed at [facility], [address] [place], [place] [address]    Special Requests   Final    BOTTLES DRAWN AEROBIC AND ANAEROBIC Blood Culture adequate volume Performed at [facility], [address], [place], [place] [address]    Culture   Final    NO GROWTH 4 DAYS Performed at [facility], [address], [place], [place] [address]    Report Status PENDING  Incomplete    Studies/Results: No results found.   Assessment/Plan: Klebsiella bacteremia PICC line (removed 08-17-18) Gastric sleeve surgery (06-2018)  Total days of antibiotics: 6 ceftriaxone     Her repeat BCx are ngtd at day 4.  She can have new PIC placed.  Would aim for 14 days of IV rx.  Available as needed.        Johny Sax MD, FACP Infectious Diseases (pager) [phone] [email or web address] 08/22/2018, 1:41 PM  LOS: 6 days

## 2018-08-22 NOTE — Progress Notes (Signed)
[facility] Progress Note  3 Days Post-Op  Subjective: CC-  Unable to tolerate more PO. Has vomited twice this afternoon. Denies much abdominal pain. Emesis is not provoked by PO intake.   Objective: Vital signs in last 24 hours: Temp:  [98 F (36.7 C)-98.5 F (36.9 C)] 98 F (36.7 C) (10/25 1400) Pulse Rate:  [58-74] 59 (10/25 1400) Resp:  [12-18] 12 (10/25 1400) BP: (118-157)/(67-99) 147/99 (10/25 1400) SpO2:  [97 %-100 %] 97 % (10/25 1400) Last BM Date: 08/22/18  Intake/Output from previous day: 10/24 0701 - 10/25 0700 In: 2532.3 [P.O.:90; I.V.:2342.3; IV Piggyback:100] Out: 1700 [Urine:1700] Intake/Output this shift: Total I/O In: 2117.5 [P.O.:140; I.V.:1877.5; IV Piggyback:100] Out: 0   PE: Gen: Alert, NAD, pleasant HEENT: EOM's intact, pupils equal and round Card: RRR Pulm: CTAB, no W/R/R, effort normal Abd: Soft,ND, NT, +BS,lap incisions cdi Psych: A&Ox3  Skin: no rashes noted, warm and dry  Lab Results:  Recent Labs    08/21/18 0422 08/22/18 0346  WBC 4.0 5.5  HGB 11.1* 11.1*  HCT 35.3* 35.3*  PLT 179 228   BMET Recent Labs    08/20/18 0436 08/21/18 0422  NA 140 141  K 3.5 3.4*  CL 104 108  CO2 27 25  GLUCOSE 121* 110*  BUN <5* <5*  CREATININE 0.62 0.59  CALCIUM 8.4* 8.5*   PT/INR No results for input(s): LABPROT, INR in the last 72 hours. CMP     Component Value Date/Time   NA 141 08/21/2018 0422   NA 138 09/07/2013 1409   K 3.4 (L) 08/21/2018 0422   K 3.7 09/07/2013 1409   CL 108 08/21/2018 0422   CL 102 11/11/2012 1324   CO2 25 08/21/2018 0422   CO2 23 09/07/2013 1409   GLUCOSE 110 (H) 08/21/2018 0422   GLUCOSE 108 09/07/2013 1409   GLUCOSE 85 11/11/2012 1324   BUN <5 (L) 08/21/2018 0422   BUN 12.4 09/07/2013 1409   CREATININE 0.59 08/21/2018 0422   CREATININE 0.8 09/07/2013 1409   CALCIUM 8.5 (L) 08/21/2018 0422   CALCIUM 9.4 09/07/2013 1409   PROT 6.2 (L) 08/21/2018 0422   PROT 7.1 09/07/2013 1409   ALBUMIN 3.2 (L) 08/21/2018 0422   ALBUMIN 3.6 09/07/2013 1409   AST 44 (H) 08/21/2018 0422   AST 14 09/07/2013 1409   ALT 39 08/21/2018 0422   ALT 28 09/07/2013 1409   ALKPHOS 59 08/21/2018 0422   ALKPHOS 81 09/07/2013 1409   BILITOT 0.3 08/21/2018 0422   BILITOT <0.20 09/07/2013 1409   GFRNONAA >60 08/21/2018 0422   GFRAA >60 08/21/2018 0422   Lipase     Component Value Date/Time   LIPASE 33 08/16/2018 0325       Studies/Results: [place] Ekg Site Rite  Result Date: 08/22/2018 If Site Rite image not attached, placement could not be confirmed due to current cardiac rhythm.   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 Hassell Done -CT scan 10/21 normal - EGD 10/22 mild gastritis, no GOO  Bacteremia -Klebsiella bacteremia -PICC line (removed 08-17-18), repeat Bcx negative after 4 days. [place] with Dr.  Hatcher to place new PICC. Aim for 14 days of IV abx  ID -rocephin 10/20>> FEN -IVF @ 130m/hr, bariatric advanced, TPN VTE -SCDs, lovenox Foley -none  Plan: Discussed with Dr. MHassell Done patient still unable to tolerate enough calories PO. Will place PICC and start on TPN. Antibiotics per ID. Continue PO intake as tolerated. Continue IV PPI q12 and carafate.    LOS: 6 days    BWellington Hampshire, [facility] Center[facility] 08/22/2018, 3:29 PM Pager: [phone] 7:00 am -11:30 AM Tues-Fri 7:00 am-4:30 pm Sat-Sun 7:00 am-11:30 am

## 2018-08-22 NOTE — Progress Notes (Addendum)
Follow up with patient revealed 2 additional ounces tolerated of protein and 2 ounces of water. Shared with Dr Daphine Deutscher.

## 2018-08-22 NOTE — Progress Notes (Signed)
[facility] Gastroenterology Progress Note  Kaitlyn Johnson 37 y.o. 11-Aug-1981   Subjective: Vomiting today with protein shake. Denies abdominal pain.  Objective: Vital signs: Vitals:   08/22/18 1204 08/22/18 1400  BP: (!) 157/99 (!) 147/99  Pulse:  (!) 59  Resp:  12  Temp:  98 F (36.7 C)  SpO2:  97%    Physical Exam: Gen: alert, no acute distress  HEENT: anicteric sclera CV: RRR Chest: CTA B Abd: minimal epigastric tenderness without guarding, soft, nondistended, +BS   Lab Results: Recent Labs    08/20/18 0436 08/21/18 0422  NA 140 141  K 3.5 3.4*  CL 104 108  CO2 27 25  GLUCOSE 121* 110*  BUN <5* <5*  CREATININE 0.62 0.59  CALCIUM 8.4* 8.5*   Recent Labs    08/20/18 0436 08/21/18 0422  AST 27 44*  ALT 30 39  ALKPHOS 50 59  BILITOT 0.6 0.3  PROT 6.8 6.2*  ALBUMIN 3.4* 3.2*   Recent Labs    08/21/18 0422 08/22/18 0346  WBC 4.0 5.5  NEUTROABS 1.6* 3.2  HGB 11.1* 11.1*  HCT 35.3* 35.3*  MCV 95.9 95.7  PLT 179 228      Assessment/Plan: Recurrent N/V in the setting of a recent sleeve gastrectomy - gastritis seen on EGD would not cause this recurrent abd pain and N/V and may have a functional obstruction from decreased gastric accomodation. Supportive care. Defer to surgery next steps. No new GI recs. Will sign off. Call if questions.   Lear Ng 08/22/2018, 4:18 PM  Questions please call [phone] ID: [name], female   DOB: 10-27-1981, 37 y.o.   MRN: [id number]

## 2018-08-22 NOTE — Progress Notes (Signed)
Rounded with Dr Daphine Deutscher.  Patient able to take in 3 ounces of protein shake without difficulty.  Expressed she would like to go home today because her children are sick.  Will continue to monitor fluid intake.

## 2018-08-22 NOTE — Progress Notes (Signed)
Peripherally Inserted Central Catheter/Midline Placement  The IV Nurse has discussed with the patient and/or persons authorized to consent for the patient, the purpose of this procedure and the potential benefits and risks involved with this procedure.  The benefits include less needle sticks, lab draws from the catheter, and the patient may be discharged home with the catheter. Risks include, but not limited to, infection, bleeding, blood clot (thrombus formation), and puncture of an artery; nerve damage and irregular heartbeat and possibility to perform a PICC exchange if needed/ordered by physician.  Alternatives to this procedure were also discussed.  Bard Power PICC patient education guide, fact sheet on infection prevention and patient information card has been provided to patient /or left at bedside.    PICC/Midline Placement Documentation  PICC Single Lumen 08/22/18 PICC Right Cephalic 39 cm 0 cm (Active)  Indication for Insertion or Continuance of Line Home intravenous therapies (PICC only) 08/22/2018  5:42 PM  Exposed Catheter (cm) 0 cm 08/22/2018  5:42 PM  Site Assessment Clean;Dry;Intact 08/22/2018  5:42 PM  Line Status Flushed;Blood return noted 08/22/2018  5:42 PM  Dressing Type Transparent 08/22/2018  5:42 PM  Dressing Status Clean;Dry;Intact;Antimicrobial disc in place 08/22/2018  5:42 PM  Dressing Intervention New dressing 08/22/2018  5:42 PM  Dressing Change Due 08/29/18 08/22/2018  5:42 PM       Reginia Forts Albarece 08/22/2018, 5:43 PM

## 2018-08-22 NOTE — Progress Notes (Signed)
PHARMACY - ADULT TOTAL PARENTERAL NUTRITION CONSULT NOTE   Pharmacy Consult for TPN Indication: Intolerance to enteral feeding  Patient Measurements: Height: 5\' 8"  (172.7 cm) Weight: 244 lb 0.8 oz (110.7 kg) IBW/kg (Calculated) : 63.9 TPN AdjBW (KG): 75.6 Body mass index is 37.11 kg/m. Usual Weight:   Insulin Requirements:  Not on insulin - no hx DM  Current Nutrition:  - premier 2 oz q4h (refused some doses) - prostat bid (refused some doses)  IVF: D5 0.45NS at 100 ml/hr  Central access: [facility] line removed 08/17/08,  new PICC placed on 10/25  TPN start date: PICC placed on 10/25  ASSESSMENT                                                                                                          HPI: Patient is a 37 y.o F s/p sleeve gastrectomy on 07/25/18, presented to the ED on 10/19 with c/o fever and abd pain.   EGD on 10/22 showed acute gastritis. Blood culture on 10/19 grew klebsiella. PICC line removed on 08/17/18 and patient's currently on abx for bacteremia with plan on treat for 14 days of IV abx.  ID's note on 10/25 indicated that she can have new PICC placed.  Patient's continues to have emesis and unable to tolerate oral intake - to start TPN per CCS.  Significant events:   Today:   Glucose (goal <150): 110 from BMET on 100/24  Electrolytes: K 3.1, Phos 2.6; Mag 2.1  Renal: scr 0.50  LFTs: wnl  TGs: pending  Prealbumin: pending  NUTRITIONAL GOALS                                                                                             RD recs (10/24): Kcal:  1600-1800 kcal Protein:  >/= 60 grams Fluid:  >/= 1.6 L/day  Custom TPN at goal rate 70 ml/hr will provide:  - Protein (AA): 60 gm (36 gm/L) - Lipid: 50 gm (30 gm/L) - Dextrose: 286 gm (17%) - Total kcal: 1717 kcal  PLAN  TPN is now made at [facility]. Will start  TPN on 10/26 since TPN consult was received after 12p and PICC not placed until ~6p.  Now: - potassium chloride 10 meq IV x6 runs  On 10/26 at 1800:  Will plan to start TPN at 40 ml/hr. Will provide 34gm AA, 29 gm lipid, 163 gm dextrose, 981 total kcal  Plan to advance as tolerated to the goal rate.  TPN to contain standard multivitamins and trace elements.  Reduce IV fluid to 60 ml/hr  TPN lab panels on Mondays & Thursdays.  F/u daily.   Seda Kronberg P 08/22/2018,3:46 PM

## 2018-08-22 NOTE — Progress Notes (Signed)
Pt vomited protein shake nausea medication given will continue to monitor.

## 2018-08-23 LAB — CBC WITH DIFFERENTIAL/PLATELET
ABS IMMATURE GRANULOCYTES: 0.04 10*3/uL (ref 0.00–0.07)
BASOS PCT: 0 %
Basophils Absolute: 0 10*3/uL (ref 0.0–0.1)
EOS ABS: 0.1 10*3/uL (ref 0.0–0.5)
Eosinophils Relative: 2 %
HCT: 35.1 % — ABNORMAL LOW (ref 36.0–46.0)
Hemoglobin: 11.2 g/dL — ABNORMAL LOW (ref 12.0–15.0)
IMMATURE GRANULOCYTES: 1 %
Lymphocytes Relative: 27 %
Lymphs Abs: 1.8 10*3/uL (ref 0.7–4.0)
MCH: 30.4 pg (ref 26.0–34.0)
MCHC: 31.9 g/dL (ref 30.0–36.0)
MCV: 95.4 fL (ref 80.0–100.0)
MONO ABS: 0.6 10*3/uL (ref 0.1–1.0)
Monocytes Relative: 9 %
NEUTROS ABS: 4.1 10*3/uL (ref 1.7–7.7)
NEUTROS PCT: 61 %
Platelets: 252 10*3/uL (ref 150–400)
RBC: 3.68 MIL/uL — ABNORMAL LOW (ref 3.87–5.11)
RDW: 13.1 % (ref 11.5–15.5)
WBC: 6.7 10*3/uL (ref 4.0–10.5)
nRBC: 0 % (ref 0.0–0.2)

## 2018-08-23 LAB — MAGNESIUM: MAGNESIUM: 1.9 mg/dL (ref 1.7–2.4)

## 2018-08-23 LAB — GLUCOSE, CAPILLARY
GLUCOSE-CAPILLARY: 107 mg/dL — AB (ref 70–99)
Glucose-Capillary: 93 mg/dL (ref 70–99)

## 2018-08-23 LAB — CULTURE, BLOOD (ROUTINE X 2)
Culture: NO GROWTH
Special Requests: ADEQUATE

## 2018-08-23 LAB — COMPREHENSIVE METABOLIC PANEL
ALT: 48 U/L — AB (ref 0–44)
AST: 49 U/L — AB (ref 15–41)
Albumin: 3.3 g/dL — ABNORMAL LOW (ref 3.5–5.0)
Alkaline Phosphatase: 58 U/L (ref 38–126)
Anion gap: 6 (ref 5–15)
CALCIUM: 8.3 mg/dL — AB (ref 8.9–10.3)
CHLORIDE: 110 mmol/L (ref 98–111)
CO2: 24 mmol/L (ref 22–32)
CREATININE: 0.52 mg/dL (ref 0.44–1.00)
Glucose, Bld: 107 mg/dL — ABNORMAL HIGH (ref 70–99)
Potassium: 3.3 mmol/L — ABNORMAL LOW (ref 3.5–5.1)
SODIUM: 140 mmol/L (ref 135–145)
Total Bilirubin: 0.4 mg/dL (ref 0.3–1.2)
Total Protein: 6.6 g/dL (ref 6.5–8.1)

## 2018-08-23 LAB — PHOSPHORUS: Phosphorus: 2.7 mg/dL (ref 2.5–4.6)

## 2018-08-23 LAB — TRIGLYCERIDES: Triglycerides: 180 mg/dL — ABNORMAL HIGH (ref <150)

## 2018-08-23 LAB — PREALBUMIN: Prealbumin: 19.5 mg/dL (ref 18–38)

## 2018-08-23 MED ORDER — ZOLPIDEM TARTRATE 5 MG PO TABS
5.0000 mg | ORAL_TABLET | Freq: Every evening | ORAL | Status: DC | PRN
  Administered 2018-08-23 – 2018-08-24 (×2): 5 mg via ORAL
  Filled 2018-08-23 (×2): qty 1

## 2018-08-23 MED ORDER — POTASSIUM CHLORIDE 10 MEQ/50ML IV SOLN
10.0000 meq | INTRAVENOUS | Status: AC
  Administered 2018-08-23 (×2): 10 meq via INTRAVENOUS
  Filled 2018-08-23 (×2): qty 50

## 2018-08-23 MED ORDER — TRAVASOL 10 % IV SOLN
INTRAVENOUS | Status: AC
  Administered 2018-08-23: 18:00:00 via INTRAVENOUS
  Filled 2018-08-23: qty 345.6

## 2018-08-23 MED ORDER — DEXTROSE-NACL 5-0.45 % IV SOLN
INTRAVENOUS | Status: AC
  Administered 2018-08-23 – 2018-08-24 (×2): via INTRAVENOUS

## 2018-08-23 NOTE — Progress Notes (Signed)
Patient continues to dry heave today. Nothing tolerated PO as of this morning. Patient states she wants to go home. I have contacted [facility] and confirmed home health services.

## 2018-08-23 NOTE — Progress Notes (Addendum)
PHARMACY - ADULT TOTAL PARENTERAL NUTRITION CONSULT NOTE   Pharmacy Consult for TPN Indication: Intolerance to enteral feeding  Patient Measurements: Height: _0  (172.7 cm) Weight: 242 lb 8.1 oz (110 kg) IBW/kg (Calculated) : 63.9 TPN AdjBW (KG): 75.6 Body mass index is 36.87 kg/m. Usual Weight:   Insulin Requirements:  Senstive SSI: none used - no hx DM  Current Nutrition:  - Bariatric advanced diet - premier 2 oz q4h, and Prostat BID (refused doses d/t N/V)  IVF: D5 0.45NS at 100 ml/hr  Central access: [place] line removed 08/17/08 d/t bacteremia,  new PICC placed on 10/25. TPN start date: 10/26  - start TPN on 10/26 since TPN consult was received on 10/25 after 12p and PICC not placed until ~6p.  ASSESSMENT                                                                                                          HPI: Patient is a 37 y.o F s/p sleeve gastrectomy on 07/25/18, presented to the ED on 10/19 with c/o fever and abd pain.   EGD on 10/22 showed acute gastritis. Blood culture on 10/19 grew klebsiella. PICC line removed on 08/17/18 and patient's currently on abx for bacteremia with plan on treat for 14 days of IV abx.  ID's note on 10/25 indicated that she can have new PICC placed.  Patient's continues to have emesis and unable to tolerate oral intake - to start TPN per CCS.  Significant events:   Today:   Glucose (goal <150): 110 from BMET on 100/24  Electrolytes: K 3.3, others WNL including Na, Phos, Mag  Renal: scr 0.52  LFTs: ASt/ALT slightly elevated, Alk phos and Tbili WNL  TGs: 180, elevated above goal < 150.  Monitor and will adjust lipids if > 400.  Prealbumin: 19.5 (10/26)  NUTRITIONAL GOALS                                                                                             RD recs (10/24): Kcal:  1600-1800 kcal Protein:  >/= 60 grams Fluid:  >/= 1.6 L/day  Custom TPN at goal rate 70 ml/hr will provide:  - Protein (AA): 60 gm (36 gm/L) -  Lipid: 33 gm (20 gm/L) - Dextrose: 336 gm (20%) - Total kcal: 1720 kcal  PLAN  Now:  KCl IV x2 runs  At 1800:  Start TPN at 40 ml/hr.   TPN will provide 34gm AA, 19 gm lipid, 192 gm dextrose, 983 total kcal  Plan to advance as tolerated to the goal rate.  TPN to contain standard multivitamins and trace elements.  Standard electrolytes with Cl:Ac ratio 1:1  Reduce IV fluid to 60 ml/hr  Sensitive SSI and CBGs q8h   TPN lab panels on Mondays & Thursdays.  F/u daily.    F/u discharge plans.   Gretta Arab PharmD, BCPS Pager [phone] 08/23/2018 10:31 AM

## 2018-08-23 NOTE — Progress Notes (Signed)
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 Hassell Done -CT scan 10/21 normal - EGD 10/22 mild gastritis, no GOO  Bacteremia -Klebsiella bacteremia -PICC line (removed 08-17-18), repeat Bcx negative after 4 days. [place] with Dr. Johnnye Sima to place new PICC. Aim for 14 days of IV abx  ID -rocephin 10/20>> FEN -IVF@ 131m/hr, bariatric advanced, TPN VTE -SCDs, lovenox Foley -none  Plan:  PICC line in place  TNA to start this PM, IV abx's ongoing  [facility]  Will attempt to reach Dr. MHassell Doneto discuss; nursing [facility]         TArmandina Gemma MD       [facility]       Office: [phone]  Chief Complaint: Persistent nausea, emesis following gastric sleeve surgery  Subjective: Patient in bed, comfortable.  Up in room.  Ate two bites of pudding this AM.  Has had dry heaves.  Objective: Vital signs in last 24 hours: Temp:  [98 F (36.7 C)-98.1 F (36.7 C)] 98.1 F (36.7 C) (10/26 0606) Pulse Rate:  [57-68] 68 (10/26 0606) Resp:  [12-18] 15 (10/26 0606) BP: (130-157)/(86-99) 130/90 (10/26 0606) SpO2:  [97 %-99 %] 99 % (10/26 0606) Weight:  [110 kg] 110 kg (10/25 1729) Last BM Date: 08/23/18  Intake/Output from previous day: 10/25 0701 - 10/26 0700 In: 3670.5 [P.O.:500; I.V.:3070.5; IV Piggyback:100] Out: 1450 [Urine:1450] Intake/Output this shift: Total I/O In: 120 [P.O.:120] Out: -   Physical Exam: HEENT - sclerae clear, mucous membranes moist Neck - soft Chest - clear bilaterally Cor - RRR Abdomen - soft, non-tender Ext - no edema, non-tender Neuro - alert & oriented, no focal deficits  Lab Results:  Recent Labs    08/22/18 0346 08/23/18 0414  WBC 5.5 6.7  HGB 11.1* 11.2*  HCT 35.3* 35.1*  PLT 228 252     BMET Recent Labs    08/22/18 1845 08/23/18 0414  NA 141 140  K 3.1* 3.3*  CL 110 110  CO2 23 24  GLUCOSE 103* 107*  BUN <5* <5*  CREATININE 0.50 0.52  CALCIUM 8.5* 8.3*   PT/INR No results for input(s): LABPROT, INR in the last 72 hours. Comprehensive Metabolic Panel:    Component Value Date/Time   NA 140 08/23/2018 0414   NA 141 08/22/2018 1845   NA 138 09/07/2013 1409   NA 135 (L) 11/11/2012 1324   K 3.3 (L) 08/23/2018 0414   K 3.1 (L) 08/22/2018 1845   K 3.7 09/07/2013 1409   K 4.0 11/11/2012 1324   CL 110 08/23/2018 0414   CL 110 08/22/2018 1845   CL 102 11/11/2012 1324   CL 98 05/04/2009 0853   CL 109 (H) 03/26/2006 1414   CO2 24 08/23/2018 0414   CO2 23 08/22/2018 1845   CO2 23 09/07/2013 1409   CO2 25 11/11/2012 1324   BUN <5 (L) 08/23/2018 0414   BUN <5 (L) 08/22/2018 1845   BUN 12.4 09/07/2013 1409   BUN 14.0 11/11/2012 1324   CREATININE 0.52 08/23/2018 0414   CREATININE 0.50 08/22/2018 1845   CREATININE 0.8 09/07/2013 1409   CREATININE 0.8 11/11/2012 1324   GLUCOSE 107 (H) 08/23/2018 0414   GLUCOSE 103 (H) 08/22/2018 1845   GLUCOSE 108 09/07/2013 1409  GLUCOSE 85 11/11/2012 1324   GLUCOSE 98 05/04/2009 0853   GLUCOSE 78 03/26/2006 1414   CALCIUM 8.3 (L) 08/23/2018 0414   CALCIUM 8.5 (L) 08/22/2018 1845   CALCIUM 9.4 09/07/2013 1409   CALCIUM 9.2 11/11/2012 1324   AST 49 (H) 08/23/2018 0414   AST 37 08/22/2018 1845   AST 14 09/07/2013 1409   AST 13 11/11/2012 1324   ALT 48 (H) 08/23/2018 0414   ALT 43 08/22/2018 1845   ALT 28 09/07/2013 1409   ALT <6 Repeated and Verified 11/11/2012 1324   ALKPHOS 58 08/23/2018 0414   ALKPHOS 62 08/22/2018 1845   ALKPHOS 81 09/07/2013 1409   ALKPHOS 79 11/11/2012 1324   BILITOT 0.4 08/23/2018 0414   BILITOT 0.5 08/22/2018 1845   BILITOT <0.20 09/07/2013 1409   BILITOT 0.59 11/11/2012 1324   PROT 6.6 08/23/2018 0414   PROT 6.7 08/22/2018 1845   PROT 7.1 09/07/2013 1409   PROT 7.8 11/11/2012 1324    ALBUMIN 3.3 (L) 08/23/2018 0414   ALBUMIN 3.6 08/22/2018 1845   ALBUMIN 3.6 09/07/2013 1409   ALBUMIN 4.0 11/11/2012 1324    Studies/Results: [place] Ekg Site Rite  Result Date: 08/22/2018 If Site Rite image not attached, placement could not be confirmed due to current cardiac rhythm.     Keevon Henney M 08/23/2018  Patient ID: [name], female   DOB: July 22, 1981, 37 y.o.   MRN: [id number]

## 2018-08-24 LAB — GLUCOSE, CAPILLARY
GLUCOSE-CAPILLARY: 97 mg/dL (ref 70–99)
GLUCOSE-CAPILLARY: 90 mg/dL (ref 70–99)
GLUCOSE-CAPILLARY: 84 mg/dL (ref 70–99)

## 2018-08-24 LAB — CBC WITH DIFFERENTIAL/PLATELET
ABS IMMATURE GRANULOCYTES: 0.06 10*3/uL (ref 0.00–0.07)
Basophils Absolute: 0 10*3/uL (ref 0.0–0.1)
Basophils Relative: 1 %
Eosinophils Absolute: 0.2 10*3/uL (ref 0.0–0.5)
Eosinophils Relative: 3 %
HCT: 36.8 % (ref 36.0–46.0)
HEMOGLOBIN: 11.6 g/dL — AB (ref 12.0–15.0)
IMMATURE GRANULOCYTES: 1 %
LYMPHS PCT: 24 %
Lymphs Abs: 1.6 10*3/uL (ref 0.7–4.0)
MCH: 30.1 pg (ref 26.0–34.0)
MCHC: 31.5 g/dL (ref 30.0–36.0)
MCV: 95.6 fL (ref 80.0–100.0)
MONO ABS: 0.6 10*3/uL (ref 0.1–1.0)
MONOS PCT: 9 %
NEUTROS ABS: 4.2 10*3/uL (ref 1.7–7.7)
NEUTROS PCT: 62 %
Platelets: 271 10*3/uL (ref 150–400)
RBC: 3.85 MIL/uL — AB (ref 3.87–5.11)
RDW: 12.9 % (ref 11.5–15.5)
WBC: 6.7 10*3/uL (ref 4.0–10.5)
nRBC: 0 % (ref 0.0–0.2)

## 2018-08-24 LAB — BASIC METABOLIC PANEL
ANION GAP: 10 (ref 5–15)
CHLORIDE: 109 mmol/L (ref 98–111)
CO2: 21 mmol/L — ABNORMAL LOW (ref 22–32)
Calcium: 8.7 mg/dL — ABNORMAL LOW (ref 8.9–10.3)
Creatinine, Ser: 0.53 mg/dL (ref 0.44–1.00)
GFR calc Af Amer: >60 mL/min (ref >60)
Glucose, Bld: 108 mg/dL — ABNORMAL HIGH (ref 70–99)
POTASSIUM: 3.4 mmol/L — AB (ref 3.5–5.1)
SODIUM: 140 mmol/L (ref 135–145)

## 2018-08-24 LAB — MAGNESIUM: MAGNESIUM: 2.1 mg/dL (ref 1.7–2.4)

## 2018-08-24 LAB — PHOSPHORUS: PHOSPHORUS: 3.8 mg/dL (ref 2.5–4.6)

## 2018-08-24 MED ORDER — PROCHLORPERAZINE EDISYLATE 10 MG/2ML IJ SOLN
10.0000 mg | Freq: Four times a day (QID) | INTRAMUSCULAR | Status: DC | PRN
  Administered 2018-08-24: 10 mg via INTRAVENOUS
  Filled 2018-08-24: qty 2

## 2018-08-24 MED ORDER — DEXTROSE-NACL 5-0.45 % IV SOLN
INTRAVENOUS | Status: DC
  Administered 2018-08-24: 19:00:00 via INTRAVENOUS

## 2018-08-24 MED ORDER — TRAVASOL 10 % IV SOLN
INTRAVENOUS | Status: DC
  Administered 2018-08-24: 18:00:00 via INTRAVENOUS
  Filled 2018-08-24: qty 604.8

## 2018-08-24 MED ORDER — SODIUM CHLORIDE 0.9 % IV SOLN
8.0000 mg | Freq: Four times a day (QID) | INTRAVENOUS | Status: DC | PRN
  Administered 2018-08-24 – 2018-08-25 (×2): 8 mg via INTRAVENOUS
  Filled 2018-08-24 (×3): qty 4

## 2018-08-24 MED ORDER — METOCLOPRAMIDE HCL 5 MG/ML IJ SOLN: 10.0000 mg | Freq: Four times a day (QID) | INTRAMUSCULAR | Status: DC | PRN

## 2018-08-24 MED ORDER — ONDANSETRON 4 MG PO TBDP: 4.0000 mg | ORAL_TABLET | Freq: Four times a day (QID) | ORAL | Status: DC | PRN

## 2018-08-24 MED ORDER — STERILE WATER FOR INJECTION IJ SOLN
INTRAMUSCULAR | Status: AC
  Administered 2018-08-24: 12:00:00
  Filled 2018-08-24: qty 10

## 2018-08-24 MED ORDER — SCOPOLAMINE 1 MG/3DAYS TD PT72
1.0000 | MEDICATED_PATCH | TRANSDERMAL | Status: DC
  Administered 2018-08-24: 1.5 mg via TRANSDERMAL
  Filled 2018-08-24: qty 1

## 2018-08-24 NOTE — Plan of Care (Signed)
Dry heaving and overall weakness remains.

## 2018-08-24 NOTE — Progress Notes (Signed)
Nutrition Follow-up  DOCUMENTATION CODES:   Obesity unspecified  INTERVENTION:   -TPN per Pharmacy -Continue Premier Protein and Prostat supplements as pt tolerates -Continue checking phosphorus and magnesium as pt is at risk for refeeding.   NUTRITION DIAGNOSIS:   Inadequate oral intake related to poor appetite as evidenced by per patient/family report.  Ongoing.  GOAL:   Patient will meet greater than or equal to 90% of their needs  Progressing now with TPN.  MONITOR:   PO intake, Supplement acceptance, Labs, Weight trends, I & O's  REASON FOR ASSESSMENT:   Consult New TPN/TNA  ASSESSMENT:   Patient with PMH significant for anxiety, Cushing's syndrome, hiatal hernia, Sphincter of Oddi dysfunction, and s/p sleeve gastrectomy (07/15/18). Presents this admission with persistent nausea, vomiting, and epigastric pain. Pt with PICC line for hydration and was found to have Klebsiella pneumona bacteremia.   10/20- PICC removed  10/22- upper endoscopy showed mild gastritis seen in sleeve gastrectomy  10/24- diet advanced to bariatric advanced 10/25 -PICC replaced, Calorie Count d/c 10/26 -TPN began at 40 ml/hr  Patient continues to have N/V, dry heaves. Pt unable to consume much PO. Refusing supplements. TPN now at 40 ml/hr providing 983 kcal and 34g protein. Pharmacy to advance to goal rate today 70 ml/hr providing 1720 kcal and 60g protein.   Weight currently stable.  Medications: D5-.45% NaCl infusion, IV Zofran PRN, IV Phenergan PRN Labs reviewed: Low K Mg/Phos WNL TG: 180 mg/dL  Diet Order:   Diet Order            Diet bariatric advanced Room service appropriate? Yes; Fluid consistency: Thin  Diet effective now              EDUCATION NEEDS:   Education needs have been addressed  Skin:  Skin Assessment: Reviewed RN Assessment  Last BM:  10/26  Height:   Ht Readings from Last 1 Encounters:  08/19/18 5\' 8"  (1.727 m)    Weight:   Wt Readings  from Last 1 Encounters:  08/22/18 110 kg    Ideal Body Weight:  63.6 kg  BMI:  Body mass index is 36.87 kg/m.  Estimated Nutritional Needs:   Kcal:  1600-1800 kcal  Protein:  >/= 60 grams  Fluid:  >/= 1.6 L/day  Tilda Franco, MS, RD, LDN [facility] Inpatient Clinical Dietitian Pager: [phone] After Hours Pager: [phone]

## 2018-08-24 NOTE — Progress Notes (Signed)
I have reviewed and concur with this student's documentation.   

## 2018-08-24 NOTE — Progress Notes (Signed)
PHARMACY - ADULT TOTAL PARENTERAL NUTRITION CONSULT NOTE   Pharmacy Consult for TPN Indication: Intolerance to enteral feeding  Patient Measurements: Height: 5' 8"  (172.7 cm) Weight: 242 lb 8.1 oz (110 kg) IBW/kg (Calculated) : 63.9 TPN AdjBW (KG): 75.6 Body mass index is 36.87 kg/m. Usual Weight:   Insulin Requirements:  Senstive SSI: none used - no hx DM  Current Nutrition:  - Bariatric advanced diet - premier 2 oz q4h, and Prostat BID (refused doses d/t N/V)  IVF: D5 0.45NS at 60 ml/hr  Central access: [facility] line removed 08/17/08 d/t bacteremia,  new PICC placed on 10/25. TPN start date: 10/26  - start TPN on 10/26 since TPN consult was received on 10/25 after 12p and PICC not placed until ~6p.  ASSESSMENT                                                                                                          HPI: Patient is a 37 y.o F s/p sleeve gastrectomy on 07/25/18, presented to the ED on 10/19 with c/o fever and abd pain.   EGD on 10/22 showed acute gastritis. Blood culture on 10/19 grew klebsiella. PICC line removed on 08/17/18 and patient's currently on abx for bacteremia with plan on treat for 14 days of IV abx.  ID's note on 10/25 indicated that she can have new PICC placed.  Patient's continues to have emesis and unable to tolerate oral intake - to start TPN per CCS.  Significant events:   Today:   Glucose (goal <150): within goal, range 90-107; no SSI used  Electrolytes: K 3.4, others WNL including Na, Phos, Mag  Renal: scr 0.53  LFTs: AST/ALT slightly elevated, Alk phos and Tbili WNL (10/26)  TGs: 180, elevated above goal < 150.  Monitor and will adjust lipids if > 400, next TG with Monday 10/28 labs.  Prealbumin: 19.5 (10/26)  NUTRITIONAL GOALS                                                                                             RD recs (10/24): Kcal:  1600-1800 kcal Protein:  >/= 60 grams Fluid:  >/= 1.6 L/day  PLAN  At 1800:  Advance TPN to goal rate, 70 ml/hr.   TPN will provide 60.5g AA, 33 g lipid, 336 g dextrose, 1720 total kcal  TPN to contain standard multivitamins and trace elements.  Standard electrolytes with Cl:Ac ratio 1:2   Reduce IV fluid to 30 ml/hr   Sensitive SSI and CBGs q8h   TPN lab panels on Mondays & Thursdays.  F/u daily.    F/u discharge plans; per patient, [facility] services have been set up.   Gretta Arab PharmD, BCPS Pager [phone] 08/24/2018 8:58 AM

## 2018-08-24 NOTE — Progress Notes (Signed)
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 Hassell Done -CT scan 10/21 normal - EGD 10/22 mild gastritis, no GOO  Bacteremia -Klebsiella bacteremia -PICC line (removed 08-17-18), repeat Bcx negative after 4 days. [place] with Dr. Johnnye Sima to place new PICC. Aim for 14 days of IV abx  ID -rocephin 10/20>> FEN -IVF@ 172m/hr, bariatric advanced, TPN VTE -SCDs, lovenox Foley -none  Plan:             PICC line in place             TNA to start this PM, IV abx's ongoing             [facility]  [facility]. MHassell Donestates OK to discharge home once arrangements for above are in place.         TArmandina Gemma MD       [facility]       Office: [phone]  Chief Complaint: Abdominal pain, nausea, dehydration  Subjective: Patient comfortable this AM, some dry heaves overnight.  Denies pain.  Objective: Vital signs in last 24 hours: Temp:  [97.9 F (36.6 C)-98.5 F (36.9 C)] 98 F (36.7 C) (10/27 0930) Pulse Rate:  [57-72] 64 (10/27 0930) Resp:  [13-16] 13 (10/27 0404) BP: (130-141)/(84-94) 132/94 (10/27 0930) SpO2:  [97 %-100 %] 99 % (10/27 0930) Last BM Date: 08/23/18  Intake/Output from previous day: 10/26 0701 - 10/27 0700 In: 2329 [P.O.:480; I.V.:1849] Out: -  Intake/Output this shift: Total I/O In: 419.3 [P.O.:60; I.V.:359.3] Out: 3 [Urine:3]  Physical Exam: HEENT - sclerae clear, mucous membranes moist Neck - soft Chest - clear bilaterally Cor - RRR Abdomen - soft; non-tender Ext - no edema, non-tender Neuro - alert & oriented, no focal deficits  Lab Results:  Recent Labs    08/23/18 0414 08/24/18 0404  WBC 6.7 6.7  HGB 11.2* 11.6*  HCT 35.1* 36.8  PLT 252 271   BMET Recent Labs    08/23/18 0414 08/24/18 0404  NA 140 140  K 3.3* 3.4*  CL 110 109  CO2 24 21*  GLUCOSE 107* 108*  BUN  <5* <5*  CREATININE 0.52 0.53  CALCIUM 8.3* 8.7*   PT/INR No results for input(s): LABPROT, INR in the last 72 hours. Comprehensive Metabolic Panel:    Component Value Date/Time   NA 140 08/24/2018 0404   NA 140 08/23/2018 0414   NA 138 09/07/2013 1409   NA 135 (L) 11/11/2012 1324   K 3.4 (L) 08/24/2018 0404   K 3.3 (L) 08/23/2018 0414   K 3.7 09/07/2013 1409   K 4.0 11/11/2012 1324   CL 109 08/24/2018 0404   CL 110 08/23/2018 0414   CL 102 11/11/2012 1324   CL 98 05/04/2009 0853   CL 109 (H) 03/26/2006 1414   CO2 21 (L) 08/24/2018 0404   CO2 24 08/23/2018 0414   CO2 23 09/07/2013 1409   CO2 25 11/11/2012 1324   BUN <5 (L) 08/24/2018 0404   BUN <5 (L) 08/23/2018 0414   BUN 12.4 09/07/2013 1409   BUN 14.0 11/11/2012 1324   CREATININE 0.53 08/24/2018 0404   CREATININE 0.52 08/23/2018 0414   CREATININE 0.8 09/07/2013 1409   CREATININE 0.8 11/11/2012 1324   GLUCOSE 108 (H) 08/24/2018 0404   GLUCOSE 107 (H) 08/23/2018 0414   GLUCOSE 108 09/07/2013 1409   GLUCOSE  85 11/11/2012 1324   GLUCOSE 98 05/04/2009 0853   GLUCOSE 78 03/26/2006 1414   CALCIUM 8.7 (L) 08/24/2018 0404   CALCIUM 8.3 (L) 08/23/2018 0414   CALCIUM 9.4 09/07/2013 1409   CALCIUM 9.2 11/11/2012 1324   AST 49 (H) 08/23/2018 0414   AST 37 08/22/2018 1845   AST 14 09/07/2013 1409   AST 13 11/11/2012 1324   ALT 48 (H) 08/23/2018 0414   ALT 43 08/22/2018 1845   ALT 28 09/07/2013 1409   ALT <6 Repeated and Verified 11/11/2012 1324   ALKPHOS 58 08/23/2018 0414   ALKPHOS 62 08/22/2018 1845   ALKPHOS 81 09/07/2013 1409   ALKPHOS 79 11/11/2012 1324   BILITOT 0.4 08/23/2018 0414   BILITOT 0.5 08/22/2018 1845   BILITOT <0.20 09/07/2013 1409   BILITOT 0.59 11/11/2012 1324   PROT 6.6 08/23/2018 0414   PROT 6.7 08/22/2018 1845   PROT 7.1 09/07/2013 1409   PROT 7.8 11/11/2012 1324   ALBUMIN 3.3 (L) 08/23/2018 0414   ALBUMIN 3.6 08/22/2018 1845   ALBUMIN 3.6 09/07/2013 1409   ALBUMIN 4.0 11/11/2012 1324     Studies/Results: [place] Ekg Site Rite  Result Date: 08/22/2018 If Site Rite image not attached, placement could not be confirmed due to current cardiac rhythm.     Kaitlyn Johnson M 08/24/2018  Patient ID: Kaitlyn Johnson, female   DOB: 12-04-1980, 37 y.o.   MRN: [id number]

## 2018-08-25 DIAGNOSIS — R7881 Bacteremia: Secondary | ICD-10-CM | POA: Diagnosis not present

## 2018-08-25 DIAGNOSIS — B961 Klebsiella pneumoniae [K. pneumoniae] as the cause of diseases classified elsewhere: Secondary | ICD-10-CM | POA: Diagnosis not present

## 2018-08-25 DIAGNOSIS — E860 Dehydration: Secondary | ICD-10-CM | POA: Diagnosis not present

## 2018-08-25 DIAGNOSIS — K2900 Acute gastritis without bleeding: Secondary | ICD-10-CM | POA: Diagnosis not present

## 2018-08-25 DIAGNOSIS — I951 Orthostatic hypotension: Secondary | ICD-10-CM | POA: Diagnosis not present

## 2018-08-25 LAB — CBC WITH DIFFERENTIAL/PLATELET
ABS IMMATURE GRANULOCYTES: 0.05 10*3/uL (ref 0.00–0.07)
Basophils Absolute: 0 10*3/uL (ref 0.0–0.1)
Basophils Relative: 0 %
EOS ABS: 0.2 10*3/uL (ref 0.0–0.5)
Eosinophils Relative: 3 %
HEMATOCRIT: 37.8 % (ref 36.0–46.0)
Hemoglobin: 12.1 g/dL (ref 12.0–15.0)
IMMATURE GRANULOCYTES: 1 %
LYMPHS ABS: 1.6 10*3/uL (ref 0.7–4.0)
Lymphocytes Relative: 23 %
MCH: 30.1 pg (ref 26.0–34.0)
MCHC: 32 g/dL (ref 30.0–36.0)
MCV: 94 fL (ref 80.0–100.0)
MONOS PCT: 11 %
Monocytes Absolute: 0.8 10*3/uL (ref 0.1–1.0)
NEUTROS PCT: 62 %
Neutro Abs: 4.2 10*3/uL (ref 1.7–7.7)
Platelets: 302 10*3/uL (ref 150–400)
RBC: 4.02 MIL/uL (ref 3.87–5.11)
RDW: 12.8 % (ref 11.5–15.5)
WBC: 6.9 10*3/uL (ref 4.0–10.5)
nRBC: 0 % (ref 0.0–0.2)

## 2018-08-25 LAB — COMPREHENSIVE METABOLIC PANEL
ALK PHOS: 67 U/L (ref 38–126)
ALT: 80 U/L — AB (ref 0–44)
AST: 61 U/L — AB (ref 15–41)
Albumin: 3.5 g/dL (ref 3.5–5.0)
Anion gap: 8 (ref 5–15)
BILIRUBIN TOTAL: 0.5 mg/dL (ref 0.3–1.2)
BUN: 7 mg/dL (ref 6–20)
CO2: 23 mmol/L (ref 22–32)
CREATININE: 0.64 mg/dL (ref 0.44–1.00)
Calcium: 8.8 mg/dL — ABNORMAL LOW (ref 8.9–10.3)
Chloride: 107 mmol/L (ref 98–111)
GFR calc Af Amer: >60 mL/min (ref >60)
Glucose, Bld: 116 mg/dL — ABNORMAL HIGH (ref 70–99)
Potassium: 3.4 mmol/L — ABNORMAL LOW (ref 3.5–5.1)
Sodium: 138 mmol/L (ref 135–145)
Total Protein: 6.7 g/dL (ref 6.5–8.1)

## 2018-08-25 LAB — PREALBUMIN: Prealbumin: 23.9 mg/dL (ref 18–38)

## 2018-08-25 LAB — GLUCOSE, CAPILLARY
Glucose-Capillary: 108 mg/dL — ABNORMAL HIGH (ref 70–99)
Glucose-Capillary: 109 mg/dL — ABNORMAL HIGH (ref 70–99)

## 2018-08-25 LAB — PHOSPHORUS: Phosphorus: 4 mg/dL (ref 2.5–4.6)

## 2018-08-25 LAB — MAGNESIUM: MAGNESIUM: 2 mg/dL (ref 1.7–2.4)

## 2018-08-25 LAB — TRIGLYCERIDES: Triglycerides: 197 mg/dL — ABNORMAL HIGH (ref <150)

## 2018-08-25 MED ORDER — PANTOPRAZOLE SODIUM 40 MG PO TBEC
40.0000 mg | DELAYED_RELEASE_TABLET | Freq: Two times a day (BID) | ORAL | Status: DC
  Administered 2018-08-25: 40 mg via ORAL
  Filled 2018-08-25: qty 1

## 2018-08-25 MED ORDER — PROCHLORPERAZINE MALEATE 5 MG PO TABS: 5.0000 mg | ORAL_TABLET | Freq: Four times a day (QID) | ORAL | 0 refills | Status: DC | PRN

## 2018-08-25 MED ORDER — SUCRALFATE 1 GM/10ML PO SUSP: 1.0000 g | Freq: Three times a day (TID) | ORAL | 1 refills | Status: DC

## 2018-08-25 MED ORDER — ONDANSETRON 4 MG PO TBDP: 4.0000 mg | ORAL_TABLET | Freq: Four times a day (QID) | ORAL | 0 refills | Status: DC | PRN

## 2018-08-25 MED ORDER — HEPARIN SOD (PORK) LOCK FLUSH 100 UNIT/ML IV SOLN
250.0000 [IU] | INTRAVENOUS | Status: AC | PRN
  Administered 2018-08-25: 250 [IU]

## 2018-08-25 MED ORDER — CEFTRIAXONE IV (FOR PTA / DISCHARGE USE ONLY): 2.0000 g | INTRAVENOUS | 0 refills | Status: DC

## 2018-08-25 MED ORDER — SCOPOLAMINE 1 MG/3DAYS TD PT72: 1.0000 | MEDICATED_PATCH | TRANSDERMAL | 0 refills | Status: DC

## 2018-08-25 MED ORDER — PANTOPRAZOLE SODIUM 40 MG PO TBEC: 40.0000 mg | DELAYED_RELEASE_TABLET | Freq: Two times a day (BID) | ORAL | 1 refills | Status: DC

## 2018-08-25 MED ORDER — ACETAMINOPHEN 500 MG PO TABS: 1000.0000 mg | ORAL_TABLET | Freq: Three times a day (TID) | ORAL | 0 refills | Status: DC

## 2018-08-25 NOTE — Discharge Summary (Addendum)
[facility] Discharge Summary   Patient ID: Kaitlyn Johnson MRN: [id number] DOB/AGE: [birth year] 37 y.o.  Admit date: 08/16/2018 Discharge date: 08/25/2018  Admitting Diagnosis: S/p sleeve gastrectomy  Persistent nausea and dehydration  Discharge Diagnosis Patient Active Problem List   Diagnosis Date Noted  . 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 Acute appendicitis 02/28/2017  . HNP (herniated nucleus pulposus), lumbar 06/22/2016  . Nausea with vomiting 11/30/2013  . Abdominal pain, epigastric 11/30/2013  . Dehydration 11/30/2013  . Acute pancreatitis 11/30/2013  . Pancreatitis 11/29/2013  . Gastroenteritis due to norovirus 11/26/2013  . Hypomagnesemia 11/25/2013  . Nausea vomiting and diarrhea 11/23/2013  . Anemia of chronic disease 11/23/2013  . Hypokalemia 11/23/2013  . Hyponatremia 11/23/2013  . Nausea and vomiting 11/23/2013  . Iatrogenic Cushing's syndrome ([place]) 10/02/2013  . Endometriosis 09/01/2013  . Celiac disease 09/01/2013  . Asthma, chronic 09/01/2013  . 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 POTS (postural orthostatic tachycardia syndrome) 11/26/2011    Consultants Gastroenterology  Imaging: No results found.  Procedures Dr. Michail Sermon (08/19/18) - Upper endoscopy  Hospital Course:  Kaitlyn Johnson is a 37yo female s/p sleeve gastrectomy 07/15/18 by Dr. Hassell Done who presented to [facility] 10/19 with persistent nausea, vomiting, and epigastric pain.  PICC was placed 10/10. She had a CT and UGI in the last week without major focal abnormality. Patient was admitted for IV fluids, antiemetics, and monitoring. Blood cultures revealed klebsiellla pneumoniae; ID was consulted and recommended IV rocephin x14 days. Bacteremia likely secondary to PICC placement. Once repeat blood cultures were negative for 4 days PICC line was replaced. CT scan repeated 10/21 and was negative for acute problem. GI was  consulted and performed an upper endoscopy 10/22 that revealed only mild gastritis. Patient was kept on IV protonix and carafate. Despite medication patient still intolerant to sufficient calories PO, therefore she was started on TPN 10/25.  On 10/28 the patient was felt stable for discharge home with home health for IV rocephin and TPN.  Patient will follow up as below and knows to call with questions or concerns.     Physical Exam: Gen:  Alert, NAD, pleasant HEENT: EOM's intact, pupils equal and round Pulm:  CTAB, no W/R/R, effort normal Abd: Soft, nontender, +BS, lap incisions cdi Psych: A&Ox3  Skin: no rashes noted, warm and dry  Allergies as of 08/25/2018      Reactions   Bee Venom Anaphylaxis   Reglan [metoclopramide] Other (See Comments)   Reaction:  Oculogyric crisis    Shellfish Allergy Anaphylaxis   Wheat Bran Other (See Comments)   Pt has celiac disease.     Adhesive [tape] Hives   Gluten Meal Other (See Comments)   Pt has celiac disease.    Sulfa Antibiotics Rash   Tartrazine Rash   Yellow Dye Rash      Medication List    STOP taking these medications   famotidine 20 MG tablet Commonly known as:  PEPCID   omeprazole 20 MG capsule Commonly known as:  PRILOSEC   traMADol 50 MG tablet Commonly known as:  ULTRAM     TAKE these medications   acetaminophen 500 MG tablet Commonly known as:  TYLENOL Take 2 tablets (1,000 mg total) by mouth every 8 (eight) hours.   AMBIEN CR 12.5 MG CR tablet Generic drug:  zolpidem Take 12.5 mg by mouth at bedtime.   cefTRIAXone  IVPB Commonly known as:  ROCEPHIN Inject 2 g into the vein daily. Indication:  Klebsiella pneumoniae bacteremia  Last Day of Therapy:  08/30/18 Labs - Once weekly:  CBC/D and BMP, Labs - Every other week:  ESR and CRP   clonazePAM 1 MG tablet Commonly known as:  KLONOPIN Take 1 tablet (1 mg total) by mouth 2 (two) times daily as needed for anxiety. What changed:  when to take this   diltiazem  180 MG 24 hr capsule Commonly known as:  CARDIZEM CD Take 180 mg by mouth daily.   DULoxetine 30 MG capsule Commonly known as:  CYMBALTA Take 30 mg by mouth daily.   hydrOXYzine 10 MG tablet Commonly known as:  ATARAX/VISTARIL Take 10 mg by mouth at bedtime.   LATUDA 60 MG Tabs Generic drug:  Lurasidone HCl Take 60 mg by mouth daily with supper.   metoprolol succinate 50 MG 24 hr tablet Commonly known as:  TOPROL-XL Take 50 mg by mouth See admin instructions. Take with 53m in the AM & PM. (746mtotal BID)   metoprolol succinate 25 MG 24 hr tablet Commonly known as:  TOPROL-XL Take 25 mg by mouth See admin instructions. Take with 50 mg in the AM & PM. (7562motal BID)   montelukast 10 MG tablet Commonly known as:  SINGULAIR Take 10 mg by mouth at bedtime.   ondansetron 4 MG disintegrating tablet Commonly known as:  ZOFRAN-ODT Take 1 tablet (4 mg total) by mouth every 6 (six) hours as needed for nausea or vomiting. What changed:    how much to take  how to take this  when to take this  reasons to take this  additional instructions   OVER THE COUNTER MEDICATION Take 1 tablet by mouth daily. Bariatric vitamin   pantoprazole 40 MG tablet Commonly known as:  PROTONIX Take 1 tablet (40 mg total) by mouth 2 (two) times daily.   prochlorperazine 5 MG tablet Commonly known as:  COMPAZINE Take 1 tablet (5 mg total) by mouth every 6 (six) hours as needed for refractory nausea / vomiting (if not controlled with zofran).   scopolamine 1 MG/3DAYS Commonly known as:  TRANSDERM-SCOP Place 1 patch (1.5 mg total) onto the skin every 3 (three) days. Start taking on:  08/27/2018   sucralfate 1 GM/10ML suspension Commonly known as:  CARAFATE Take 10 mLs (1 g total) by mouth 4 (four) times daily -  with meals and at bedtime.   traZODone 100 MG tablet Commonly known as:  DESYREL Take 300 mg by mouth at bedtime.            Home Infusion Instuctions  (From admission,  onward)         Start     Ordered   08/25/18 0000  Home infusion instructions [facility] May follow [facility] Protocol; May administer Cathflo as needed to maintain patency of vascular access device.; Flushing of vascular access device: [facility]: 0.9% NaCl pre/post medica...    Question Answer Comment  Instructions May follow [facility] Protocol   Instructions May administer Cathflo as needed to maintain patency of vascular access device.   Instructions Flushing of vascular access device: per [facility]: 0.9% NaCl pre/post medication administration and prn patency; Heparin 100 u/ml, 5ml81mr implanted ports and Heparin 10u/ml, 5ml 60m all other central venous catheters.   Instructions May follow [facility] Anaphylaxis Protocol for First Dose Administration in the home: 0.9% NaCl at 25-50 ml/hr to maintain IV access for protocol meds. Epinephrine 0.3 ml IV/IM PRN and Benadryl 25-50  IV/IM PRN s/s of anaphylaxis.   Instructions [facility] Infusion Coordinator (RN) to assist per patient IV care needs in the home PRN.      08/25/18 0916           Follow-up Information    Health, [facility]-Home Follow up.   Specialty:  [place] Why:  for home health infusion services Contact information: [address] [place] [address] [phone]        Johnathan Hausen, MD. Call.   Specialty:  General Surgery Why:  We are working on your appointment, please call to confirm. Contact information: [address] [place] [address] [phone]           Signed: Wellington Hampshire, [facility] 08/25/2018, 9:16 AM Pager: [phone] Mon 7:00 am -11:30 AM Tues-Fri 7:00 am-4:30 pm Sat-Sun 7:00 am-11:30 am

## 2018-08-25 NOTE — Care Management Note (Signed)
Case Management Note  Patient Details  Name: Kaitlyn Johnson MRN: [id number] Date of Birth: 08/24/1981  Subjective/Objective:                    Action/Plan: [facility] aware of d/c and needs for IV abx and TNA  Expected Discharge Date:  08/25/18               Expected Discharge Plan:  Home w Home Health Services  In-House Referral:  NA  Discharge planning Services  CM Consult  Post Acute Care Choice:  Home Health Choice offered to:  Patient  DME Arranged:  N/A DME Agency:  NA  HH Arranged:  RN, Disease Management HH Agency:  [facility]  Status of Service:  Completed, signed off  If discussed at Long Length of Stay Meetings, dates discussed:    Additional Comments:  Alexis Goodell, RN 08/25/2018, 10:08 AM

## 2018-08-25 NOTE — Progress Notes (Signed)
PHARMACY - ADULT TOTAL PARENTERAL NUTRITION CONSULT NOTE   Pharmacy Consult for TPN Indication: Intolerance to enteral feeding  Patient Measurements: Height: 5' 8"  (172.7 cm) Weight: 242 lb 8.1 oz (110 kg) IBW/kg (Calculated) : 63.9 TPN AdjBW (KG): 75.6 Body mass index is 36.87 kg/m. Usual Weight:   Insulin Requirements:  Senstive SSI: none used - no hx DM  Current Nutrition:  - Bariatric advanced diet - premier 2 oz q4h, and Prostat BID (refused doses d/t N/V)  IVF: D5 0.45NS at 30 ml/hr  Central access: [facility] line removed 08/17/08 d/t bacteremia,  new PICC placed on 10/25. TPN start date: 10/26  - start TPN on 10/26 since TPN consult was received on 10/25 after 12p and PICC not placed until ~6p.  ASSESSMENT                                                                                                          HPI: Patient is a 37 y.o F s/p sleeve gastrectomy on 07/25/18, presented to the ED on 10/19 with c/o fever and abd pain.   EGD on 10/22 showed acute gastritis. Blood culture on 10/19 grew klebsiella. PICC line removed on 08/17/18 and patient's currently on abx for bacteremia with plan on treat for 14 days of IV abx.  ID's note on 10/25 indicated that she can have new PICC placed.  Patient's continues to have emesis and unable to tolerate oral intake - to start TPN per [facility].  Significant events:   Today:   Glucose (goal <150): within goal, range 90-107; no SSI used> DC SSI/CBGs  Electrolytes: K 3.4, others WNL including Na, Phos, Mag  Renal: scr WNL  LFTs: AST/ALT slightly elevated, Alk phos and Tbili WNL (10/26)  TGs: 180 10/26, 197 10/28  Prealbumin: 19.5 (10/26) 23.9 (10/28)  WNL  On bari clears w/ poor po intakes and continued dry heaves; refusing prostat and premier protein   NUTRITIONAL GOALS                                                                                             RD recs (10/24): Kcal:  1600-1800 kcal Protein:  >/= 60 grams Fluid:   >/= 1.6 L/day  PLAN  DC home w/ [facility] TPN ordered- will NOT make a TPN for today At 1800:  TPN per [facility]: goal rate is 70 ml/hr. [facility] could begin to cycle if desired  TPN provides 60.5g AA, 33 g lipid, 336 g dextrose, 1720 total kcal.  Standard electrolytes with Cl:Ac ratio 1:2   MVI/trace elements in TPN  IV fluid at 30 ml/hr   DC SSI/CBGs   IV PPI to PO, could add pepcid to TPN but on 10/23 [facility] wanted PPI BID  TPN lab panels on Mondays & Thursdays.  F/u daily.   F/u discharge plans; per patient, [facility] services have been set up.  Eudelia Bunch, Pharm.D [phone] 08/25/2018 9:20 AM

## 2018-08-25 NOTE — Progress Notes (Signed)
Discharge instructions discussed with patient and family, verbalized agreement and understanding 

## 2018-08-26 DIAGNOSIS — R7881 Bacteremia: Secondary | ICD-10-CM | POA: Diagnosis not present

## 2018-08-26 DIAGNOSIS — Z9989 Dependence on other enabling machines and devices: Secondary | ICD-10-CM | POA: Diagnosis not present

## 2018-09-02 DIAGNOSIS — R000 Tachycardia, unspecified: Secondary | ICD-10-CM | POA: Diagnosis not present

## 2018-09-02 DIAGNOSIS — I951 Orthostatic hypotension: Secondary | ICD-10-CM | POA: Diagnosis not present

## 2018-09-04 DIAGNOSIS — F900 Attention-deficit hyperactivity disorder, predominantly inattentive type: Secondary | ICD-10-CM | POA: Diagnosis not present

## 2018-09-04 DIAGNOSIS — F411 Generalized anxiety disorder: Secondary | ICD-10-CM | POA: Diagnosis not present

## 2018-09-09 ENCOUNTER — Encounter: Payer: Self-pay | Admitting: Skilled Nursing Facility1

## 2018-09-09 ENCOUNTER — Encounter: Payer: BLUE CROSS/BLUE SHIELD | Attending: Surgery | Admitting: Skilled Nursing Facility1

## 2018-09-09 DIAGNOSIS — E669 Obesity, unspecified: Secondary | ICD-10-CM | POA: Insufficient documentation

## 2018-09-09 NOTE — Progress Notes (Signed)
Follow-up visit:  Post-Operative sleeve Surgery Primary concerns today: Post-operative Bariatric Surgery Nutrition Management.  Pt states she had gastritis, after last appt got a PICC line for rehydration clinic and then became septic spending 9 days int he hospital and then TPN for 7 days. Pt states since then she has experienced no nausea. Pt states this is the first week she has felt normal. Pt states her energy level is coming back. Pt states she has a bowel movement every day. Pt states she does not like the taste of plain water.   24 hr recall: 3-4 days a week having 3 meals feeling better on the days she has 5 meals a day Full protein shake Deli meat and cheese 3 pm Cashews  Or almonds  Dinner: corn black bean fat free sour cream  and cheese and [place]key casserole   Surgery date: 02/04/2018 Surgery type: Sleeve  [facility] Weight today:  237 Weight change: 11  TANITA  BODY COMP RESULTS  07/29/2018 08/05/2018 09/09/2018   BMI (kg/m^2) 37.8 37.7 36   Fat Mass (lbs) 133.2 128.8 121.4   Fat Free Mass (lbs) 115.4 119.2 115.6   Total Body Water (lbs) 85.4 88.2 85.2   Fluid intake: Gatorade zero, zero vitamin water, unsweet tea: 40-60 ounces Estimated total protein intake: 60+  Medications: see list Supplementation: (does not remember) possibly procare and calcium   Using straws: no Drinking while eating: no Having you been chewing well:no Chewing/swallowing difficulties: no Changes in vision: no Changes to mood/headaches: no Hair loss/Cahnges to skin/Changes to nails: no Any difficulty focusing or concentrating: no Sweating: no Dizziness/Lightheaded: no Palpitations: no  Carbonated beverages: no N/V/D/C/GAS: no Abdominal Pain: no Dumping syndrome: no  Recent physical activity:  Walking 3 days a week for about 20-30 minutes   Progress Towards Goal(s):  In progress.    Intervention:  Nutrition cousneling. Pts diet was advanced to the next phase now  including complex carbohydrates and non starchy veggies. Due to the bodies need for essential vitamins, minerals, and fats the pt was educated on the need to consume a certain amount of calories as well as certain nutrients daily. Pt was educated on the need for daily physical activity and to reach a goal of at least 150 minutes of moderate to vigorous physical activity as directed by their physician due to such benefits as increased musculature and improved lab values.   Goals: Create balanced meals within the your needs (60g protein, 45 grams CHO, non starchy veggies)  Teaching Method Utilized:  Visual Auditory Hands on  Barriers to learning/adherence to lifestyle change: none identified   Demonstrated degree of understanding via:  Teach Back   Monitoring/Evaluation:  Dietary intake, exercise, and body weight.

## 2018-09-11 ENCOUNTER — Ambulatory Visit: Payer: Self-pay | Admitting: Dietician

## 2018-10-03 DIAGNOSIS — R112 Nausea with vomiting, unspecified: Secondary | ICD-10-CM | POA: Diagnosis not present

## 2018-10-03 DIAGNOSIS — Z9884 Bariatric surgery status: Secondary | ICD-10-CM | POA: Diagnosis not present

## 2018-10-03 DIAGNOSIS — Z9889 Other specified postprocedural states: Secondary | ICD-10-CM | POA: Diagnosis not present

## 2018-10-03 DIAGNOSIS — K219 Gastro-esophageal reflux disease without esophagitis: Secondary | ICD-10-CM | POA: Diagnosis not present

## 2018-10-07 DIAGNOSIS — R112 Nausea with vomiting, unspecified: Secondary | ICD-10-CM | POA: Diagnosis not present

## 2018-10-08 DIAGNOSIS — Z0189 Encounter for other specified special examinations: Secondary | ICD-10-CM | POA: Diagnosis not present

## 2018-10-08 DIAGNOSIS — R000 Tachycardia, unspecified: Secondary | ICD-10-CM | POA: Diagnosis not present

## 2018-10-08 DIAGNOSIS — I951 Orthostatic hypotension: Secondary | ICD-10-CM | POA: Diagnosis not present

## 2018-10-19 DIAGNOSIS — N3001 Acute cystitis with hematuria: Secondary | ICD-10-CM | POA: Diagnosis not present

## 2018-10-30 DIAGNOSIS — Z9884 Bariatric surgery status: Secondary | ICD-10-CM | POA: Diagnosis not present

## 2018-10-30 DIAGNOSIS — R112 Nausea with vomiting, unspecified: Secondary | ICD-10-CM | POA: Diagnosis not present

## 2018-10-30 DIAGNOSIS — K2970 Gastritis, unspecified, without bleeding: Secondary | ICD-10-CM | POA: Diagnosis not present

## 2018-10-30 DIAGNOSIS — Z9049 Acquired absence of other specified parts of digestive tract: Secondary | ICD-10-CM | POA: Diagnosis not present

## 2018-10-30 DIAGNOSIS — K219 Gastro-esophageal reflux disease without esophagitis: Secondary | ICD-10-CM | POA: Diagnosis not present

## 2018-10-30 DIAGNOSIS — K2930 Chronic superficial gastritis without bleeding: Secondary | ICD-10-CM | POA: Diagnosis not present

## 2018-11-03 DIAGNOSIS — K2950 Unspecified chronic gastritis without bleeding: Secondary | ICD-10-CM | POA: Diagnosis not present

## 2018-11-04 DIAGNOSIS — R112 Nausea with vomiting, unspecified: Secondary | ICD-10-CM | POA: Diagnosis not present

## 2018-11-05 DIAGNOSIS — B952 Enterococcus as the cause of diseases classified elsewhere: Secondary | ICD-10-CM | POA: Diagnosis not present

## 2018-11-05 DIAGNOSIS — N3010 Interstitial cystitis (chronic) without hematuria: Secondary | ICD-10-CM | POA: Diagnosis not present

## 2018-11-05 DIAGNOSIS — N390 Urinary tract infection, site not specified: Secondary | ICD-10-CM | POA: Diagnosis not present

## 2018-11-05 DIAGNOSIS — N3000 Acute cystitis without hematuria: Secondary | ICD-10-CM | POA: Diagnosis not present

## 2018-11-11 ENCOUNTER — Encounter: Payer: Self-pay | Admitting: Skilled Nursing Facility1

## 2018-11-11 ENCOUNTER — Encounter: Payer: BLUE CROSS/BLUE SHIELD | Attending: Surgery | Admitting: Skilled Nursing Facility1

## 2018-11-11 DIAGNOSIS — E669 Obesity, unspecified: Secondary | ICD-10-CM | POA: Diagnosis not present

## 2018-11-11 NOTE — Progress Notes (Signed)
Follow-up visit:  Post-Operative sleeve Surgery Primary concerns today: Post-operative Bariatric Surgery Nutrition Management.  Hx got a PICC line for rehydration clinic and then became septic spending 9 days int he hospital and then TPN for 7 days.   Pt states she has had another bought of gastritis stating she will need to take the protonix long term having vomited for 3 weeks every day. Pt states she has been feeling better since this past Sunday (11/09/2018). Pt states her gastritis episode starting 3 days after stopping the protonix. Pt states during this time she did have a bought of constipation not having a bowel movement for 6 days. Pt states she has not been able to take the multivitamin stating she is throwing them up. Pt states during her gastritis flare up she was only able to tolerate potato soup.  Pt states she is currently feeling much better.   24 hr recall:  sausage patty with 1 slice of cheese (first try at eating foods again)   Regular sprite: 1 can a day and unsweet tea with lemon: 40: 52 ounces total   Surgery date: 02/04/2018 Surgery type: Sleeve  Start weight at [facility]: 255.5 Weight today:  222.6 Weight change: 15  TANITA  BODY COMP RESULTS  07/29/2018 08/05/2018 09/09/2018 11/11/2018   BMI (kg/m^2) 37.8 37.7 36 33.8   Fat Mass (lbs) 133.2 128.8 121.4 112   Fat Free Mass (lbs) 115.4 119.2 115.6 110.6   Total Body Water (lbs) 85.4 88.2 85.2 81   Fluid intake: Gatorade zero, zero vitamin water, unsweet tea: 40-60 ounces Estimated total protein intake: 60+  Medications: see list Supplementation: (does not remember) possibly procare and calcium   Using straws: no Drinking while eating: no Having you been chewing well:no Chewing/swallowing difficulties: no Changes in vision: no Changes to mood/headaches: no Hair loss/Cahnges to skin/Changes to nails: no Any difficulty focusing or concentrating: no Sweating: no Dizziness/Lightheaded: no Palpitations: no   Carbonated beverages: no N/V/D/C/GAS: no Abdominal Pain: no Dumping syndrome: no  Recent physical activity:  Walking 3 days a week for about 20-30 minutes   Progress Towards Goal(s):  In progress.    Intervention:  Nutrition cousneling. Pts diet was advanced to the next phase now including complex carbohydrates and non starchy veggies. Due to the bodies need for essential vitamins, minerals, and fats the pt was educated on the need to consume a certain amount of calories as well as certain nutrients daily. Pt was educated on the need for daily physical activity and to reach a goal of at least 150 minutes of moderate to vigorous physical activity as directed by their physician due to such benefits as increased musculature and improved lab values.   Goals: -Keep a daily journal of signs and symptoms as well as what and how much you are eating and drinking  -Try alkaline water pH 8.8 -Eat smaller amounts of food every 3 hours: Eating a minimum of 5 times a day with protein at each meal, non-starchy vegetables soft cooked (nothing raw) at 2 of those meals, and complex carbohydrates at 2-3 of those meals  -For dinner: brown rice eat less than 1/3 cup and then 1.5-2 ounces of ground [place] and dollop of plain greek yogurt -Go back to taking the chewable multivitamin  -If you begin to struggle with eating try: Orgain plant based protein  Soy Isolate: Now Sports   Teaching Method Utilized:  Visual Auditory Hands on  Barriers to learning/adherence to lifestyle change: none identified  Demonstrated degree of understanding via:  Teach Back   Monitoring/Evaluation:  Dietary intake, exercise, and body weight.

## 2018-11-11 NOTE — Patient Instructions (Addendum)
-  Keep a daily journal of signs and symptoms as well as what and how much you are eating and drinking   -Try alkaline water pH 8.8  -Eat smaller amounts of food every 3 hours: Eating a minimum of 5 times a day with protein at each meal, non-starchy vegetables soft cooked (nothing raw) at 2 of those meals, and complex carbohydrates at 2-3 of those meals   -For dinner: brown rice eat less than 1/3 cup and then 1.5-2 ounces of ground [place] and dollop of plain greek yogurt  -Go back to taking the chewable multivitamin   -If you begin to struggle with eating try: Orgain plant based protein  Soy Isolate: Now Sports

## 2018-12-04 ENCOUNTER — Encounter: Payer: Self-pay | Admitting: Cardiology

## 2018-12-05 ENCOUNTER — Encounter: Payer: Self-pay | Admitting: Cardiology

## 2018-12-05 ENCOUNTER — Ambulatory Visit: Payer: Self-pay | Admitting: Cardiology

## 2018-12-05 DIAGNOSIS — R002 Palpitations: Secondary | ICD-10-CM

## 2018-12-05 HISTORY — DX: Palpitations: R00.2

## 2018-12-05 NOTE — Progress Notes (Deleted)
Subjective:   _0  ID: Kaitlyn Johnson, female    DOB: 1980/11/16, 38 y.o.   MRN: [id number]  No chief complaint on file.   HPI: Kaitlyn Johnson is a pleasant Caucasian female with hernitated ruptured cercical disc in 2015 and was placed on chronic steroid therapy, since then she developed Cushing's disease, significant weight gain. Patient has had chronic palpitations, but was being evaluated on an annual basis for the same. She was doing well and was reassured with this approach. Is on Metoprolol and Dilt and has been stable with this. I had seen her in Dec 2019 and now presents for a 2 month f/u.   Patient has been evaluated by cardiology Dr. Ardelia Mems and has a diagnosis of POTS, celiac disease, superior mesenteric artery syndrome, chronic steroid use, which she has discontinued 6 months ago. 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  Past Medical History:  Diagnosis Date  . Acute appendicitis 02/28/2017  . Acute pancreatitis 11/30/2013  . Anemia 11/26/2011  . Anxiety   . Asthma   . Celiac and mesenteric artery injury   . Cushing's syndrome ([place])    06/21/16- "in remission"  . Depression   . H/O hiatal hernia   . History of kidney stones   . Iatrogenic Cushing's syndrome ([place]) 10/02/2013  . Nausea vomiting and diarrhea 11/23/2013  . Nausea with vomiting 11/30/2013  . Palpitations 12/05/2018  . Pancreatitis 11/29/2013  . POTS (postural orthostatic tachycardia syndrome)   . Shortness of breath dyspnea    with exertertion  . Sphincter of Oddi dysfunction   . Von Willebrand disease ([place])    bruising easy    Past Surgical History:  Procedure Laterality Date  . ABDOMINAL HYSTERECTOMY  08/2010  . ANTERIOR CERVICAL DECOMP/DISCECTOMY FUSION N/A 03/31/2013   Procedure: ANTERIOR CERVICAL DECOMPRESSION/DISCECTOMY FUSION 1 LEVEL Cervical five-six;  Surgeon: Faythe Ghee, MD;  Location: [place] [facility];  Service: Neurosurgery;  Laterality: N/A;  . APPENDECTOMY     . Cholangio-Pancreatography with Spintectorotomy + Stent  07/16/2012   01/15/14  . CHOLECYSTECTOMY    . ESOPHAGOGASTRODUODENOSCOPY (EGD) WITH PROPOFOL N/A 08/19/2018   Procedure: ESOPHAGOGASTRODUODENOSCOPY (EGD) WITH PROPOFOL;  Surgeon: Wilford Corner, MD;  Location: [facility];  Service: Endoscopy;  Laterality: N/A;  . LAPAROSCOPIC APPENDECTOMY N/A 02/28/2017   Procedure: APPENDECTOMY LAPAROSCOPIC;  Surgeon: Erroll Luna, MD;  Location: [facility];  Service: General;  Laterality: N/A;  . LAPAROSCOPIC ENDOMETRIOSIS FULGURATION    . LAPAROSCOPIC GASTRIC SLEEVE RESECTION N/A 07/15/2018   Procedure: LAPAROSCOPIC GASTRIC SLEEVE RESECTION, UPPER ENDO, ERAS Pathway;  Surgeon: Johnathan Hausen, MD;  Location: [facility];  Service: General;  Laterality: N/A;  . LUMBAR LAMINECTOMY    . LUMBAR LAMINECTOMY/DECOMPRESSION MICRODISCECTOMY Left 06/22/2016   Procedure: MICRODISCECTOMY LEFT LUMBAR FOUR-FIVE;  Surgeon: Consuella Lose, MD;  Location: [place] [facility];  Service: Neurosurgery;  Laterality: Left;  . ROUX-EN-Y PROCEDURE  04/1999    Social History   Socioeconomic History  . Marital status: Married    Spouse name: Not on file  . Number of children: Not on file  . Years of education: Not on file  . Highest education level: Not on file  Occupational History  . Not on file  Social Needs  . Financial resource strain: Not on file  . Food insecurity:    Worry: Never true    Inability: Never true  . Transportation needs:    Medical: Not on file    Non-medical: Not on file  Tobacco Use  . Smoking status: Never Smoker  . Smokeless tobacco: Never Used  Substance and Sexual Activity  . Alcohol use: Yes    Alcohol/week: 5.0 standard drinks    Types: 5 Glasses of wine per week  . Drug use: No  . Sexual activity: Yes    Partners: Male    Birth control/protection: Surgical  Lifestyle  . Physical activity:    Days per week: Not on file    Minutes per session: Not on file  . Stress: Not on file    Relationships  . Social connections:    Talks on phone: Not on file    Gets together: Not on file    Attends religious service: Not on file    Active member of club or organization: Not on file    Attends meetings of clubs or organizations: Not on file    Relationship status: Not on file  . Intimate partner violence:    Fear of current or ex partner: Not on file    Emotionally abused: Not on file    Physically abused: Not on file    Forced sexual activity: Not on file  Other Topics Concern  . Not on file  Social History Narrative   Regular exercise: can't at this time   Caffeine use: no    Current Outpatient Medications on File Prior to Visit  Medication Sig Dispense Refill  . acetaminophen (TYLENOL) 500 MG tablet Take 2 tablets (1,000 mg total) by mouth every 8 (eight) hours. 30 tablet 0  . AMBIEN CR 12.5 MG CR tablet Take 12.5 mg by mouth at bedtime.  1  . cefTRIAXone (ROCEPHIN) IVPB Inject 2 g into the vein daily. Indication:  Klebsiella pneumoniae bacteremia  Last Day of Therapy:  08/30/18 Labs - Once weekly:  CBC/D and BMP, Labs - Every other week:  ESR and CRP 11 Units 0  . clonazePAM (KLONOPIN) 1 MG tablet Take 1 tablet (1 mg total) by mouth 2 (two) times daily as needed for anxiety. (Patient taking differently: Take 1 mg by mouth daily as needed for anxiety. ) 30 tablet 0  . diltiazem (CARDIZEM CD) 180 MG 24 hr capsule Take 180 mg by mouth daily.   10  . DULoxetine (CYMBALTA) 30 MG capsule Take 30 mg by mouth daily.    . hydrOXYzine (ATARAX/VISTARIL) 10 MG tablet Take 10 mg by mouth at bedtime.   2  . LATUDA 60 MG TABS Take 60 mg by mouth daily with supper.   1  . metoprolol succinate (TOPROL-XL) 25 MG 24 hr tablet Take 25 mg by mouth See admin instructions. Take with 50 mg in the AM & PM. (35m total BID)  10  . metoprolol succinate (TOPROL-XL) 50 MG 24 hr tablet Take 50 mg by mouth See admin instructions. Take with 245min the AM & PM. (7536motal BID)  10  . montelukast  (SINGULAIR) 10 MG tablet Take 10 mg by mouth at bedtime.    . ondansetron (ZOFRAN-ODT) 4 MG disintegrating tablet Take 1 tablet (4 mg total) by mouth every 6 (six) hours as needed for nausea or vomiting. 20 tablet 0  . OVER THE COUNTER MEDICATION Take 1 tablet by mouth daily. Bariatric vitamin    . pantoprazole (PROTONIX) 40 MG tablet Take 1 tablet (40 mg total) by mouth 2 (two) times daily. 30 tablet 1  . prochlorperazine (COMPAZINE) 5 MG tablet Take 1 tablet (5 mg total) by mouth every 6 (six) hours as  needed for refractory nausea / vomiting (if not controlled with zofran). 20 tablet 0  . scopolamine (TRANSDERM-SCOP) 1 MG/3DAYS Place 1 patch (1.5 mg total) onto the skin every 3 (three) days. 10 patch 0  . sucralfate (CARAFATE) 1 GM/10ML suspension Take 10 mLs (1 g total) by mouth 4 (four) times daily -  with meals and at bedtime. 420 mL 1  . traZODone (DESYREL) 100 MG tablet Take 300 mg by mouth at bedtime.      No current facility-administered medications on file prior to visit.      ROS     Objective:  There were no vitals taken for this visit.  Physical Exam  Constitutional: She appears well-developed. No distress.  Mildly obese  HENT:  Head: Atraumatic.  Eyes: Conjunctivae are normal.  Neck: Neck supple. No JVD present. No thyromegaly present.  Cardiovascular: Normal rate, regular rhythm, normal heart sounds and intact distal pulses. Exam reveals no gallop.  No murmur heard. Pulmonary/Chest: Effort normal and breath sounds normal.  Abdominal: Soft. Bowel sounds are normal.  Musculoskeletal: Normal range of motion.        General: No edema.  Neurological: She is alert.  Skin: Skin is warm and dry.  Psychiatric: She has a normal mood and affect.    ***Echocardiogram ***  ***Stress test***  ***Coronary angiogram***  Assessment & Recommendations:   1. POTS (postural orthostatic tachycardia syndrome) ***  2. Mild intermittent chronic asthma without  complication ***  3. Palpitations   4. Abnormal LFB and mild hyperlipidemia 08/25/2018: Potassium 3.4, creatinine 0.64, calcium 8.8, AST 61, ALT 80.  EGFR greater than 60, CMP otherwise normal.  CBC normal.  Prealbumin normal.  11/30/2013: Cholesterol 187, triglycerides 105, HDL 49, LDL 117.     Adrian Prows, MD, [facility] 12/05/2018, [facility]. [place] Pager: [phone] Office: [phone] If no answer Cell [phone]

## 2018-12-10 ENCOUNTER — Ambulatory Visit: Payer: Self-pay | Admitting: Skilled Nursing Facility1

## 2018-12-23 DIAGNOSIS — F3181 Bipolar II disorder: Secondary | ICD-10-CM | POA: Diagnosis not present

## 2018-12-23 DIAGNOSIS — Z6832 Body mass index (BMI) 32.0-32.9, adult: Secondary | ICD-10-CM | POA: Diagnosis not present

## 2018-12-23 DIAGNOSIS — M47816 Spondylosis without myelopathy or radiculopathy, lumbar region: Secondary | ICD-10-CM | POA: Diagnosis not present

## 2018-12-25 DIAGNOSIS — J069 Acute upper respiratory infection, unspecified: Secondary | ICD-10-CM | POA: Diagnosis not present

## 2018-12-30 DIAGNOSIS — J069 Acute upper respiratory infection, unspecified: Secondary | ICD-10-CM | POA: Diagnosis not present

## 2019-01-05 DIAGNOSIS — J019 Acute sinusitis, unspecified: Secondary | ICD-10-CM | POA: Diagnosis not present

## 2019-01-15 DIAGNOSIS — M47816 Spondylosis without myelopathy or radiculopathy, lumbar region: Secondary | ICD-10-CM | POA: Diagnosis not present

## 2019-01-15 DIAGNOSIS — Q763 Congenital scoliosis due to congenital bony malformation: Secondary | ICD-10-CM | POA: Diagnosis not present

## 2019-01-20 DIAGNOSIS — F3181 Bipolar II disorder: Secondary | ICD-10-CM | POA: Diagnosis not present

## 2019-02-03 DIAGNOSIS — F3181 Bipolar II disorder: Secondary | ICD-10-CM | POA: Diagnosis not present

## 2019-02-12 DIAGNOSIS — F3181 Bipolar II disorder: Secondary | ICD-10-CM | POA: Diagnosis not present

## 2019-02-26 DIAGNOSIS — F3181 Bipolar II disorder: Secondary | ICD-10-CM | POA: Diagnosis not present

## 2019-03-26 DIAGNOSIS — F3181 Bipolar II disorder: Secondary | ICD-10-CM | POA: Diagnosis not present

## 2019-03-30 DIAGNOSIS — F411 Generalized anxiety disorder: Secondary | ICD-10-CM | POA: Diagnosis not present

## 2019-04-02 ENCOUNTER — Encounter (HOSPITAL_COMMUNITY): Payer: Self-pay

## 2019-04-02 ENCOUNTER — Emergency Department (HOSPITAL_COMMUNITY)
Admission: EM | Admit: 2019-04-02 | Discharge: 2019-04-02 | Disposition: A | Discharge status: Home / Self Care | Payer: BC Managed Care – PPO | Attending: Emergency Medicine | Admitting: Emergency Medicine

## 2019-04-02 ENCOUNTER — Other Ambulatory Visit: Payer: Self-pay

## 2019-04-02 DIAGNOSIS — Z5321 Procedure and treatment not carried out due to patient leaving prior to being seen by health care provider: Secondary | ICD-10-CM | POA: Diagnosis not present

## 2019-04-02 DIAGNOSIS — R1010 Upper abdominal pain, unspecified: Secondary | ICD-10-CM | POA: Insufficient documentation

## 2019-04-02 DIAGNOSIS — R110 Nausea: Secondary | ICD-10-CM | POA: Diagnosis not present

## 2019-04-02 LAB — CBC
HCT: 40.5 % (ref 36.0–46.0)
Hemoglobin: 13.3 g/dL (ref 12.0–15.0)
MCH: 33.3 pg (ref 26.0–34.0)
MCHC: 32.8 g/dL (ref 30.0–36.0)
MCV: 101.3 fL — ABNORMAL HIGH (ref 80.0–100.0)
Platelets: 256 10*3/uL (ref 150–400)
RBC: 4 MIL/uL (ref 3.87–5.11)
RDW: 12.4 % (ref 11.5–15.5)
WBC: 8 10*3/uL (ref 4.0–10.5)
nRBC: 0 % (ref 0.0–0.2)

## 2019-04-02 LAB — COMPREHENSIVE METABOLIC PANEL
ALT: 17 U/L (ref 0–44)
AST: 18 U/L (ref 15–41)
Albumin: 4.6 g/dL (ref 3.5–5.0)
Alkaline Phosphatase: 61 U/L (ref 38–126)
Anion gap: 7 (ref 5–15)
BUN: 8 mg/dL (ref 6–20)
CO2: 25 mmol/L (ref 22–32)
Calcium: 9.2 mg/dL (ref 8.9–10.3)
Chloride: 106 mmol/L (ref 98–111)
Creatinine, Ser: 0.75 mg/dL (ref 0.44–1.00)
GFR calc Af Amer: >60 mL/min (ref >60)
GFR calc non Af Amer: >60 mL/min (ref >60)
Glucose, Bld: 83 mg/dL (ref 70–99)
Potassium: 4 mmol/L (ref 3.5–5.1)
Sodium: 138 mmol/L (ref 135–145)
Total Bilirubin: <0.1 mg/dL — ABNORMAL LOW (ref 0.3–1.2)
Total Protein: 7.8 g/dL (ref 6.5–8.1)

## 2019-04-02 LAB — LIPASE, BLOOD: Lipase: 33 U/L (ref 11–51)

## 2019-04-02 MED ORDER — SODIUM CHLORIDE 0.9% FLUSH: 3.0000 mL | Freq: Once | INTRAVENOUS | Status: DC

## 2019-04-02 NOTE — ED Notes (Signed)
Patient called x2 for room placement with no answer. 

## 2019-04-02 NOTE — ED Notes (Signed)
Pt called for room, no response from lobby 

## 2019-04-02 NOTE — ED Notes (Signed)
Patient called for room placement x3 with no answer. 

## 2019-04-02 NOTE — ED Triage Notes (Signed)
Patient c/o nausea and upper abdomina pain xx 5 days. Patient states she had a gastric sleeve 06/2018. Patient called GI and GI surgeon and wanted patient to come to the ED to rule out dehydration and labs.

## 2019-04-06 ENCOUNTER — Other Ambulatory Visit: Payer: Self-pay | Admitting: Cardiology

## 2019-04-06 DIAGNOSIS — R112 Nausea with vomiting, unspecified: Secondary | ICD-10-CM | POA: Diagnosis not present

## 2019-04-06 DIAGNOSIS — R0789 Other chest pain: Secondary | ICD-10-CM | POA: Diagnosis not present

## 2019-04-06 DIAGNOSIS — R197 Diarrhea, unspecified: Secondary | ICD-10-CM | POA: Diagnosis not present

## 2019-04-07 DIAGNOSIS — F419 Anxiety disorder, unspecified: Secondary | ICD-10-CM | POA: Diagnosis present

## 2019-04-07 DIAGNOSIS — R197 Diarrhea, unspecified: Secondary | ICD-10-CM | POA: Diagnosis not present

## 2019-04-15 DIAGNOSIS — F411 Generalized anxiety disorder: Secondary | ICD-10-CM | POA: Diagnosis not present

## 2019-05-04 ENCOUNTER — Other Ambulatory Visit: Payer: Self-pay | Admitting: Cardiology

## 2019-05-05 DIAGNOSIS — F3181 Bipolar II disorder: Secondary | ICD-10-CM | POA: Diagnosis not present

## 2019-06-18 DIAGNOSIS — F331 Major depressive disorder, recurrent, moderate: Secondary | ICD-10-CM | POA: Diagnosis not present

## 2019-07-02 DIAGNOSIS — F3181 Bipolar II disorder: Secondary | ICD-10-CM | POA: Diagnosis not present

## 2019-07-08 ENCOUNTER — Other Ambulatory Visit: Payer: Self-pay | Admitting: Surgery

## 2019-07-08 DIAGNOSIS — Z9884 Bariatric surgery status: Secondary | ICD-10-CM

## 2019-07-09 ENCOUNTER — Other Ambulatory Visit: Payer: Self-pay | Admitting: Surgery

## 2019-07-09 ENCOUNTER — Ambulatory Visit
Admission: RE | Admit: 2019-07-09 | Discharge: 2019-07-09 | Disposition: A | Discharge status: Home / Self Care | Payer: BC Managed Care – PPO | Source: Ambulatory Visit | Attending: Surgery | Admitting: Surgery

## 2019-07-09 DIAGNOSIS — Z9884 Bariatric surgery status: Secondary | ICD-10-CM

## 2019-07-09 DIAGNOSIS — K219 Gastro-esophageal reflux disease without esophagitis: Secondary | ICD-10-CM | POA: Diagnosis not present

## 2019-07-10 DIAGNOSIS — R509 Fever, unspecified: Secondary | ICD-10-CM | POA: Diagnosis not present

## 2019-07-10 DIAGNOSIS — J069 Acute upper respiratory infection, unspecified: Secondary | ICD-10-CM | POA: Diagnosis not present

## 2019-07-10 DIAGNOSIS — R51 Headache: Secondary | ICD-10-CM | POA: Diagnosis not present

## 2019-07-10 DIAGNOSIS — R05 Cough: Secondary | ICD-10-CM | POA: Diagnosis not present

## 2019-07-12 DIAGNOSIS — R51 Headache: Secondary | ICD-10-CM | POA: Diagnosis not present

## 2019-07-12 DIAGNOSIS — J069 Acute upper respiratory infection, unspecified: Secondary | ICD-10-CM | POA: Diagnosis not present

## 2019-07-12 DIAGNOSIS — R509 Fever, unspecified: Secondary | ICD-10-CM | POA: Diagnosis not present

## 2019-07-12 DIAGNOSIS — R05 Cough: Secondary | ICD-10-CM | POA: Diagnosis not present

## 2019-07-15 DIAGNOSIS — K219 Gastro-esophageal reflux disease without esophagitis: Secondary | ICD-10-CM | POA: Diagnosis not present

## 2019-07-27 ENCOUNTER — Telehealth: Payer: Self-pay

## 2019-07-27 NOTE — Telephone Encounter (Signed)
NP seen 09/2018, NS appt in Feb. Called c/o dizziness going from sitting to standing x 2 days and feels as thou HR is elevated but was not able to check HR or BP. Has POTS and gastric sleeve surgery in Sept 2019.. concern of dehydration. JG pt but Placed on AK schedule due to first availability.//ah

## 2019-07-28 ENCOUNTER — Other Ambulatory Visit: Payer: Self-pay

## 2019-07-28 ENCOUNTER — Ambulatory Visit (INDEPENDENT_AMBULATORY_CARE_PROVIDER_SITE_OTHER): Payer: BLUE CROSS/BLUE SHIELD | Admitting: Cardiology

## 2019-07-28 ENCOUNTER — Encounter: Payer: Self-pay | Admitting: Cardiology

## 2019-07-28 VITALS — Ht 68.0 in | Wt 203.0 lb

## 2019-07-28 DIAGNOSIS — R000 Tachycardia, unspecified: Secondary | ICD-10-CM | POA: Diagnosis not present

## 2019-07-28 DIAGNOSIS — R197 Diarrhea, unspecified: Secondary | ICD-10-CM | POA: Diagnosis not present

## 2019-07-28 DIAGNOSIS — R55 Syncope and collapse: Secondary | ICD-10-CM | POA: Diagnosis not present

## 2019-07-28 DIAGNOSIS — I498 Other specified cardiac arrhythmias: Secondary | ICD-10-CM

## 2019-07-28 DIAGNOSIS — I951 Orthostatic hypotension: Secondary | ICD-10-CM

## 2019-07-28 DIAGNOSIS — R42 Dizziness and giddiness: Secondary | ICD-10-CM

## 2019-07-28 DIAGNOSIS — R1110 Vomiting, unspecified: Secondary | ICD-10-CM | POA: Diagnosis not present

## 2019-07-28 DIAGNOSIS — G90A Postural orthostatic tachycardia syndrome (POTS): Secondary | ICD-10-CM

## 2019-07-28 NOTE — Progress Notes (Addendum)
Primary Physician:  Clementeen Graham, PA-C   Patient ID: Kaitlyn Johnson, female    DOB: [birth year], 38 y.o.   MRN: [id number]  Subjective:    Chief Complaint  Patient presents with  . Dizziness  . Irregular Heart Beat  . Follow-up    HPI: Kaitlyn Johnson  is a 38 y.o. female  with hernitated ruptured cercical disc in 2015 and was placed on chronic steroid therapy, since then she developed Cushing's disease, significant weight gain. Patient has had chronic palpitations, but was being evaluated on an annual basis for the same. She was doing well and was reassured with this approach. Is on Metoprolol and Dilt and has been stable with this.  Patient has been evaluated by cardiology Dr. Carolann Littler and has a diagnosis of POTS, celiac disease, superior mesenteric artery syndrome, chronic steroid use, which she has discontinued 6 months ago. 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 And due to potential for weight loss and hypotension as she is on chronic Metoprolol and Dilt for POTS, recommended regular follow up with Cardiology.  She made an appointment to see me today for episodes of near syncope over the last 1-2 weeks with sudden position changes. She has not had any syncope. No significant heart racing. She does report for the last 1-2 months, she has had significant issues with nausea/vomiting and diarrhea. States that she is only able to keep down sips of water. She is being closely followed by GI and states that she has developed a stricture and is to see them in the next few days for follow up. She is on antiemetic medicine.   Past Medical History:  Diagnosis Date  . Acute appendicitis 02/28/2017  . Acute pancreatitis 11/30/2013  . Anemia 11/26/2011  . Anxiety   . Asthma   . Celiac and mesenteric artery injury   . Cushing's syndrome ([facility])    06/21/16- "in remission"  . Depression   . H/O hiatal hernia   . History of kidney stones   . Iatrogenic  Cushing's syndrome ([facility]) 10/02/2013  . Nausea vomiting and diarrhea 11/23/2013  . Nausea with vomiting 11/30/2013  . Palpitations 12/05/2018  . Pancreatitis 11/29/2013  . POTS (postural orthostatic tachycardia syndrome)   . Shortness of breath dyspnea    with exertertion  . Sphincter of Oddi dysfunction   . Von Willebrand disease ([facility])    bruising easy    Past Surgical History:  Procedure Laterality Date  . ABDOMINAL HYSTERECTOMY  08/2010  . ANTERIOR CERVICAL DECOMP/DISCECTOMY FUSION N/A 03/31/2013   Procedure: ANTERIOR CERVICAL DECOMPRESSION/DISCECTOMY FUSION 1 LEVEL Cervical five-six;  Surgeon: Reinaldo Meeker, MD;  Location: [facility];  Service: Neurosurgery;  Laterality: N/A;  . APPENDECTOMY    . Cholangio-Pancreatography with Spintectorotomy + Stent  07/16/2012   01/15/14  . CHOLECYSTECTOMY    . ESOPHAGOGASTRODUODENOSCOPY (EGD) WITH PROPOFOL N/A 08/19/2018   Procedure: ESOPHAGOGASTRODUODENOSCOPY (EGD) WITH PROPOFOL;  Surgeon: Charlott Rakes, MD;  Location: [facility];  Service: Endoscopy;  Laterality: N/A;  . LAPAROSCOPIC APPENDECTOMY N/A 02/28/2017   Procedure: APPENDECTOMY LAPAROSCOPIC;  Surgeon: Harriette Bouillon, MD;  Location: [facility];  Service: General;  Laterality: N/A;  . LAPAROSCOPIC ENDOMETRIOSIS FULGURATION    . LAPAROSCOPIC GASTRIC SLEEVE RESECTION N/A 07/15/2018   Procedure: LAPAROSCOPIC GASTRIC SLEEVE RESECTION, UPPER ENDO, ERAS Pathway;  Surgeon: Luretha Nicolini, MD;  Location: [facility];  Service: General;  Laterality: N/A;  . LUMBAR LAMINECTOMY    . LUMBAR LAMINECTOMY/DECOMPRESSION MICRODISCECTOMY Left 06/22/2016  Procedure: MICRODISCECTOMY LEFT LUMBAR FOUR-FIVE;  Surgeon: Consuella Lose, MD;  Location: [place] [facility];  Service: Neurosurgery;  Laterality: Left;  . ROUX-EN-Y PROCEDURE  04/1999    Social History   Socioeconomic History  . Marital status: Married    Spouse name: Not on file  . Number of children: 0  . Years of education: Not on file  . Highest  education level: Not on file  Occupational History  . Not on file  Social Needs  . Financial resource strain: Not on file  . Food insecurity    Worry: Never true    Inability: Never true  . Transportation needs    Medical: Not on file    Non-medical: Not on file  Tobacco Use  . Smoking status: Never Smoker  . Smokeless tobacco: Never Used  Substance and Sexual Activity  . Alcohol use: Yes    Alcohol/week: 5.0 standard drinks    Types: 5 Glasses of wine per week  . Drug use: No  . Sexual activity: Yes    Partners: Male    Birth control/protection: Surgical  Lifestyle  . Physical activity    Days per week: Not on file    Minutes per session: Not on file  . Stress: Not on file  Relationships  . Social Herbalist on phone: Not on file    Gets together: Not on file    Attends religious service: Not on file    Active member of club or organization: Not on file    Attends meetings of clubs or organizations: Not on file    Relationship status: Not on file  . Intimate partner violence    Fear of current or ex partner: Not on file    Emotionally abused: Not on file    Physically abused: Not on file    Forced sexual activity: Not on file  Other Topics Concern  . Not on file  Social History Narrative   Regular exercise: can't at this time   Caffeine use: no    Review of Systems  Constitution: Negative for decreased appetite, malaise/fatigue, weight gain and weight loss.  Eyes: Negative for visual disturbance.  Cardiovascular: Positive for near-syncope. Negative for chest pain, claudication, dyspnea on exertion, leg swelling, orthopnea, palpitations and syncope.  Respiratory: Negative for hemoptysis and wheezing.   Endocrine: Negative for cold intolerance and heat intolerance.  Hematologic/Lymphatic: Does not bruise/bleed easily.  Skin: Negative for nail changes.  Musculoskeletal: Negative for muscle weakness and myalgias.  Gastrointestinal: Positive for diarrhea,  nausea and vomiting. Negative for abdominal pain and change in bowel habit.  Neurological: Negative for difficulty with concentration, dizziness, focal weakness and headaches.  Psychiatric/Behavioral: Negative for altered mental status and suicidal ideas.  All other systems reviewed and are negative.     Objective:  Height 5\' 8"  (1.727 m), weight 203 lb (92.1 kg), SpO2 99 %. Body mass index is 30.87 kg/m.   BP supine: 110/70, HR 81 BP sitting: 109/76, HR 90 BP standing 110/72, HR 92    Physical Exam  Constitutional: She is oriented to person, place, and time. Vital signs are normal. She appears well-developed and well-nourished.  HENT:  Head: Normocephalic and atraumatic.  Neck: Normal range of motion.  Cardiovascular: Normal rate, regular rhythm, normal heart sounds and intact distal pulses.  Pulmonary/Chest: Effort normal and breath sounds normal. No accessory muscle usage. No respiratory distress.  Abdominal: Soft. Bowel sounds are normal.  Musculoskeletal: Normal range of motion.  Neurological:  She is alert and oriented to person, place, and time.  Skin: Skin is warm and dry.  Vitals reviewed.  Radiology: No results found.  Laboratory examination:    CMP Latest Ref Rng & Units 07/28/2019 04/02/2019 08/25/2018  Glucose 65 - 99 mg/dL 161(W) 83 960(A)  BUN 6 - 20 mg/dL 5(L) 8 7  Creatinine 5.40 - 1.00 mg/dL 9.81 1.91 4.78  Sodium 134 - 144 mmol/L 140 138 138  Potassium 3.5 - 5.2 mmol/L 4.5 4.0 3.4(L)  Chloride 96 - 106 mmol/L 104 106 107  CO2 20 - 29 mmol/L 22 25 23   Calcium 8.7 - 10.2 mg/dL 9.4 9.2 )  Total Protein 6.5 - 8.1 g/dL - 7.8 6.7  Total Bilirubin 0.3 - 1.2 mg/dL - <0.1(L) 0.5  Alkaline Phos 38 - 126 U/L - 61 67  AST 15 - 41 U/L - 18 61(H)  ALT 0 - 44 U/L - 17 80(H)   CBC Latest Ref Rng & Units 04/02/2019 08/25/2018 08/24/2018  WBC 4.0 - 10.5 K/uL 8.0 6.9 6.7  Hemoglobin 12.0 - 15.0 g/dL 08/26/2018 62.1 11.6(L)  Hematocrit 36.0 - 46.0 % 40.5 37.8 36.8   Platelets 150 - 400 K/uL 256 302 271   Lipid Panel     Component Value Date/Time   CHOL 187 11/30/2013 0210   TRIG 197 (H) 08/25/2018 0439   HDL 49 11/30/2013 0210   CHOLHDL 3.8 11/30/2013 0210   VLDL 21 11/30/2013 0210   LDLCALC 117 (H) 11/30/2013 0210   HEMOGLOBIN A1C No results found for: HGBA1C, MPG TSH No results for input(s): TSH in the last 8760 hours.  PRN Meds:. Medications Discontinued During This Encounter  Medication Reason  . DULoxetine (CYMBALTA) 30 MG capsule Error  . LATUDA 60 MG TABS Error  . metoprolol succinate (TOPROL-XL) 25 MG 24 hr tablet Error  . scopolamine (TRANSDERM-SCOP) 1 MG/3DAYS Error   Current Meds  Medication Sig  . acetaminophen (TYLENOL) 500 MG tablet Take 2 tablets (1,000 mg total) by mouth every 8 (eight) hours.  01/28/2014 CR 12.5 MG CR tablet Take 12.5 mg by mouth at bedtime.  . ARIPiprazole (ABILIFY) 10 MG tablet Take 10 mg by mouth daily.  . clonazePAM (KLONOPIN) 1 MG tablet Take 1 tablet (1 mg total) by mouth 2 (two) times daily as needed for anxiety. (Patient taking differently: Take 1 mg by mouth daily as needed for anxiety. )  . diltiazem (CARDIZEM CD) 180 MG 24 hr capsule TAKE 1 CAPSULE DAILY.  . hydrOXYzine (ATARAX/VISTARIL) 10 MG tablet Take 10 mg by mouth at bedtime.   . metoprolol succinate (TOPROL-XL) 50 MG 24 hr tablet TAKE 1 TABLET BY MOUTH TWICE DAILY.  . montelukast (SINGULAIR) 10 MG tablet Take 10 mg by mouth at bedtime.  . ondansetron (ZOFRAN-ODT) 4 MG disintegrating tablet Take 1 tablet (4 mg total) by mouth every 6 (six) hours as needed for nausea or vomiting.  Remus Loffler OVER THE COUNTER MEDICATION Take 1 tablet by mouth daily. Bariatric vitamin  . pantoprazole (PROTONIX) 40 MG tablet Take 1 tablet (40 mg total) by mouth 2 (two) times daily.  . prochlorperazine (COMPAZINE) 5 MG tablet Take 1 tablet (5 mg total) by mouth every 6 (six) hours as needed for refractory nausea / vomiting (if not controlled with zofran).  . sucralfate  (CARAFATE) 1 GM/10ML suspension Take 10 mLs (1 g total) by mouth 4 (four) times daily -  with meals and at bedtime.  . traZODone (DESYREL) 100 MG tablet Take 300 mg  by mouth at bedtime.     Cardiac Studies:     Assessment:   POTS (postural orthostatic tachycardia syndrome) - Plan: EKG 12-Lead  Postural dizziness with near syncope  Non-intractable vomiting, presence of nausea not specified, unspecified vomiting type - Plan: Basic metabolic panel, Basic metabolic panel  Diarrhea, unspecified type  EKG 07/28/2019: Normal sinus rhythm at 82 bpm, normal axis, nonspecific T wave flattening in the lateral leads, no changes from previous EKG in December 2019.  Recommendations:    Jilda RocheKendall Dawes is a pleasant Caucasian female with hernitated ruptured cercical disc in 2015 and was placed on chronic steroid therapy, since then she developed Cushing's disease, significant weight gain. She has since underwent vertical sleeve gastrectomy in Sept 2019. Patient has had chronic palpitations and diagnosis of POTS, but was being evaluated on an annual basis for the same.   She called our office requesting to be seen due to near syncope with position changes and occasional elevations in her heart rate.  She is not noted to be orthostatic and did not have significant heart rate changes with position changes on exam today.  I suspect her symptoms are related to dehydration as she has been having vomiting and diarrhea for the last 1 month related to stricture.  She is to be following up with GI in the next few days for management of this.  Encouraged her to drink Pedialyte, eat bland foods, and push fluids.  I will check a BMP to evaluate her electrolytes.  Her blood pressure is overall stable with            metoprolol and diltiazem.  Once she undergoes evaluation with GI if she continues to have symptoms, will consider making changes at that time.  We will see her back as scheduled, but encouraged her to contact  me sooner if needed.  Toniann FailAshton Haynes Carmen Tolliver, MSN, APRN, FNP-C [facility] [place] Office: [phone] Fax: [phone]

## 2019-07-29 LAB — BASIC METABOLIC PANEL WITH GFR
BUN/Creatinine Ratio: 7 — ABNORMAL LOW (ref 9–23)
BUN: 5 mg/dL — ABNORMAL LOW (ref 6–20)
CO2: 22 mmol/L (ref 20–29)
Calcium: 9.4 mg/dL (ref 8.7–10.2)
Chloride: 104 mmol/L (ref 96–106)
Creatinine, Ser: 0.74 mg/dL (ref 0.57–1.00)
GFR calc Af Amer: 121 mL/min/1.73
GFR calc non Af Amer: 105 mL/min/1.73
Glucose: 109 mg/dL — ABNORMAL HIGH (ref 65–99)
Potassium: 4.5 mmol/L (ref 3.5–5.2)
Sodium: 140 mmol/L (ref 134–144)

## 2019-08-07 DIAGNOSIS — K900 Celiac disease: Secondary | ICD-10-CM | POA: Diagnosis not present

## 2019-08-07 DIAGNOSIS — R112 Nausea with vomiting, unspecified: Secondary | ICD-10-CM | POA: Diagnosis not present

## 2019-08-07 DIAGNOSIS — Z9884 Bariatric surgery status: Secondary | ICD-10-CM | POA: Diagnosis not present

## 2019-08-07 DIAGNOSIS — K529 Noninfective gastroenteritis and colitis, unspecified: Secondary | ICD-10-CM | POA: Diagnosis not present

## 2019-08-12 DIAGNOSIS — R112 Nausea with vomiting, unspecified: Secondary | ICD-10-CM | POA: Diagnosis not present

## 2019-08-12 DIAGNOSIS — F3181 Bipolar II disorder: Secondary | ICD-10-CM | POA: Diagnosis not present

## 2019-08-12 DIAGNOSIS — K900 Celiac disease: Secondary | ICD-10-CM | POA: Diagnosis not present

## 2019-08-14 DIAGNOSIS — R1111 Vomiting without nausea: Secondary | ICD-10-CM | POA: Diagnosis not present

## 2019-08-24 DIAGNOSIS — Z319 Encounter for procreative management, unspecified: Secondary | ICD-10-CM | POA: Diagnosis not present

## 2019-09-18 DIAGNOSIS — M542 Cervicalgia: Secondary | ICD-10-CM | POA: Diagnosis not present

## 2019-09-22 DIAGNOSIS — Z981 Arthrodesis status: Secondary | ICD-10-CM | POA: Diagnosis not present

## 2019-09-22 DIAGNOSIS — M5412 Radiculopathy, cervical region: Secondary | ICD-10-CM | POA: Diagnosis not present

## 2019-09-22 DIAGNOSIS — M542 Cervicalgia: Secondary | ICD-10-CM | POA: Diagnosis not present

## 2019-09-28 ENCOUNTER — Other Ambulatory Visit: Payer: Self-pay | Admitting: Neurosurgery

## 2019-09-28 DIAGNOSIS — M5412 Radiculopathy, cervical region: Secondary | ICD-10-CM

## 2019-10-01 ENCOUNTER — Ambulatory Visit
Admission: RE | Admit: 2019-10-01 | Discharge: 2019-10-01 | Disposition: A | Discharge status: Home / Self Care | Payer: BC Managed Care – PPO | Source: Ambulatory Visit | Attending: Neurosurgery | Admitting: Neurosurgery

## 2019-10-01 ENCOUNTER — Other Ambulatory Visit: Payer: Self-pay

## 2019-10-01 DIAGNOSIS — M5412 Radiculopathy, cervical region: Secondary | ICD-10-CM

## 2019-10-01 DIAGNOSIS — M4802 Spinal stenosis, cervical region: Secondary | ICD-10-CM | POA: Diagnosis not present

## 2019-10-06 DIAGNOSIS — M75101 Unspecified rotator cuff tear or rupture of right shoulder, not specified as traumatic: Secondary | ICD-10-CM | POA: Diagnosis not present

## 2019-10-06 DIAGNOSIS — G5621 Lesion of ulnar nerve, right upper limb: Secondary | ICD-10-CM | POA: Diagnosis not present

## 2019-10-06 DIAGNOSIS — M47812 Spondylosis without myelopathy or radiculopathy, cervical region: Secondary | ICD-10-CM | POA: Diagnosis not present

## 2019-10-06 DIAGNOSIS — Z981 Arthrodesis status: Secondary | ICD-10-CM | POA: Diagnosis not present

## 2019-10-13 ENCOUNTER — Other Ambulatory Visit: Payer: Self-pay | Admitting: Plastic Surgery

## 2019-10-13 DIAGNOSIS — Z01818 Encounter for other preprocedural examination: Secondary | ICD-10-CM | POA: Diagnosis not present

## 2019-10-13 DIAGNOSIS — J4520 Mild intermittent asthma, uncomplicated: Secondary | ICD-10-CM | POA: Diagnosis not present

## 2019-10-13 DIAGNOSIS — F418 Other specified anxiety disorders: Secondary | ICD-10-CM | POA: Diagnosis not present

## 2019-10-13 DIAGNOSIS — R000 Tachycardia, unspecified: Secondary | ICD-10-CM | POA: Diagnosis not present

## 2019-10-13 DIAGNOSIS — Z411 Encounter for cosmetic surgery: Secondary | ICD-10-CM | POA: Diagnosis not present

## 2019-10-13 DIAGNOSIS — D680 Von Willebrand's disease: Secondary | ICD-10-CM | POA: Diagnosis not present

## 2019-10-13 DIAGNOSIS — Z1231 Encounter for screening mammogram for malignant neoplasm of breast: Secondary | ICD-10-CM

## 2019-10-14 ENCOUNTER — Encounter: Payer: Self-pay | Admitting: Cardiology

## 2019-10-15 ENCOUNTER — Ambulatory Visit
Admission: RE | Admit: 2019-10-15 | Discharge: 2019-10-15 | Disposition: A | Discharge status: Home / Self Care | Payer: BC Managed Care – PPO | Source: Ambulatory Visit | Attending: Plastic Surgery | Admitting: Plastic Surgery

## 2019-10-15 ENCOUNTER — Other Ambulatory Visit: Payer: Self-pay

## 2019-10-15 DIAGNOSIS — F3181 Bipolar II disorder: Secondary | ICD-10-CM | POA: Diagnosis not present

## 2019-10-15 DIAGNOSIS — Z1231 Encounter for screening mammogram for malignant neoplasm of breast: Secondary | ICD-10-CM

## 2019-10-16 DIAGNOSIS — Z01818 Encounter for other preprocedural examination: Secondary | ICD-10-CM | POA: Diagnosis not present

## 2019-10-29 DIAGNOSIS — Z20828 Contact with and (suspected) exposure to other viral communicable diseases: Secondary | ICD-10-CM | POA: Diagnosis not present

## 2019-10-29 DIAGNOSIS — Z20818 Contact with and (suspected) exposure to other bacterial communicable diseases: Secondary | ICD-10-CM | POA: Diagnosis not present

## 2019-11-12 DIAGNOSIS — F3181 Bipolar II disorder: Secondary | ICD-10-CM | POA: Diagnosis not present

## 2019-11-12 DIAGNOSIS — F331 Major depressive disorder, recurrent, moderate: Secondary | ICD-10-CM | POA: Diagnosis not present

## 2019-11-26 DIAGNOSIS — E860 Dehydration: Secondary | ICD-10-CM | POA: Diagnosis not present

## 2019-12-15 DIAGNOSIS — F3181 Bipolar II disorder: Secondary | ICD-10-CM | POA: Diagnosis not present

## 2019-12-15 DIAGNOSIS — F331 Major depressive disorder, recurrent, moderate: Secondary | ICD-10-CM | POA: Diagnosis not present

## 2020-01-13 ENCOUNTER — Encounter (HOSPITAL_COMMUNITY): Payer: Self-pay

## 2020-02-11 DIAGNOSIS — F3181 Bipolar II disorder: Secondary | ICD-10-CM | POA: Diagnosis not present

## 2020-02-11 DIAGNOSIS — F331 Major depressive disorder, recurrent, moderate: Secondary | ICD-10-CM | POA: Diagnosis not present

## 2020-03-13 DIAGNOSIS — J301 Allergic rhinitis due to pollen: Secondary | ICD-10-CM | POA: Diagnosis not present

## 2020-03-29 ENCOUNTER — Other Ambulatory Visit: Payer: Self-pay

## 2020-03-29 ENCOUNTER — Other Ambulatory Visit: Payer: Self-pay | Admitting: Cardiology

## 2020-03-29 MED ORDER — METOPROLOL SUCCINATE ER 50 MG PO TB24: 50.0000 mg | ORAL_TABLET | Freq: Two times a day (BID) | ORAL | 0 refills | Status: DC

## 2020-03-29 MED ORDER — DILTIAZEM HCL ER COATED BEADS 180 MG PO CP24: 180.0000 mg | ORAL_CAPSULE | Freq: Every day | ORAL | 0 refills | Status: DC

## 2020-04-15 DIAGNOSIS — N3010 Interstitial cystitis (chronic) without hematuria: Secondary | ICD-10-CM | POA: Diagnosis not present

## 2020-04-15 DIAGNOSIS — N898 Other specified noninflammatory disorders of vagina: Secondary | ICD-10-CM | POA: Diagnosis not present

## 2020-04-22 ENCOUNTER — Other Ambulatory Visit: Payer: Self-pay

## 2020-04-22 ENCOUNTER — Ambulatory Visit: Payer: BC Managed Care – PPO | Admitting: Cardiology

## 2020-04-22 ENCOUNTER — Encounter: Payer: Self-pay | Admitting: Cardiology

## 2020-04-22 VITALS — BP 125/87 | HR 68 | Ht 69.0 in | Wt 185.0 lb

## 2020-04-22 DIAGNOSIS — I498 Other specified cardiac arrhythmias: Secondary | ICD-10-CM | POA: Diagnosis not present

## 2020-04-22 DIAGNOSIS — G90A Postural orthostatic tachycardia syndrome (POTS): Secondary | ICD-10-CM

## 2020-04-22 DIAGNOSIS — R002 Palpitations: Secondary | ICD-10-CM | POA: Diagnosis not present

## 2020-04-22 NOTE — Progress Notes (Signed)
Primary Physician:  Maude Leriche, PA-C   Patient ID: Kaitlyn Johnson, female    DOB: Oct 20, 1981, 39 y.o.   MRN: [id number]  Subjective:    Chief Complaint  Patient presents with  . Postral orthostatic tachycardia syndrome    HPI: Kaitlyn Johnson  is a 39 y.o. female  with hernitated ruptured cervical disc in 2015 and was placed on chronic steroid therapy, since then she developed Cushing's disease, significant weight gain. 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 She was having severe issues with nausea and vomiting, but eventually this is all settled down.  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  She has occasional episodes of dizziness when she is dehydrated.  No further palpitations.  Patient has been evaluated by cardiology Dr. Ardelia Mems and has a diagnosis of POTS, celiac disease, superior mesenteric artery syndrome. She is on chronic Metoprolol and Dilt for POTS, recommended regular follow up with Cardiology.   Past Medical History:  Diagnosis Date  . Acute appendicitis 02/28/2017  . Acute pancreatitis 11/30/2013  . Anemia 11/26/2011  . Anxiety   . Asthma   . Celiac and mesenteric artery injury   . Cushing's syndrome ([place])    06/21/16- "in remission"  . Depression   . H/O hiatal hernia   . History of kidney stones   . Iatrogenic Cushing's syndrome ([place]) 10/02/2013  . Nausea vomiting and diarrhea 11/23/2013  . Nausea with vomiting 11/30/2013  . Palpitations 12/05/2018  . Pancreatitis 11/29/2013  . POTS (postural orthostatic tachycardia syndrome)   . Shortness of breath dyspnea    with exertertion  . Sphincter of Oddi dysfunction   . Von Willebrand disease ([place])    bruising easy    Past Surgical History:  Procedure Laterality Date  . ABDOMINAL HYSTERECTOMY  08/2010  . ANTERIOR CERVICAL DECOMP/DISCECTOMY FUSION N/A 03/31/2013   Procedure: ANTERIOR CERVICAL  DECOMPRESSION/DISCECTOMY FUSION 1 LEVEL Cervical five-six;  Surgeon: Faythe Ghee, MD;  Location: [facility];  Service: Neurosurgery;  Laterality: N/A;  . APPENDECTOMY    . Cholangio-Pancreatography with Spintectorotomy + Stent  07/16/2012   01/15/14  . CHOLECYSTECTOMY    . ESOPHAGOGASTRODUODENOSCOPY (EGD) WITH PROPOFOL N/A 08/19/2018   Procedure: ESOPHAGOGASTRODUODENOSCOPY (EGD) WITH PROPOFOL;  Surgeon: Wilford Corner, MD;  [facility];  Service: Endoscopy;  Laterality: N/A;  . LAPAROSCOPIC APPENDECTOMY N/A 02/28/2017   Procedure: APPENDECTOMY LAPAROSCOPIC;  Surgeon: Erroll Luna, MD;  Location: [facility];  Service: General;  Laterality: N/A;  . LAPAROSCOPIC ENDOMETRIOSIS FULGURATION    . LAPAROSCOPIC GASTRIC SLEEVE RESECTION N/A 07/15/2018   Procedure: LAPAROSCOPIC GASTRIC SLEEVE RESECTION, UPPER ENDO, ERAS Pathway;  Surgeon: Johnathan Hausen, MD;  [facility];  Service: General;  Laterality: N/A;  . LUMBAR LAMINECTOMY    . LUMBAR LAMINECTOMY/DECOMPRESSION MICRODISCECTOMY Left 06/22/2016   Procedure: MICRODISCECTOMY LEFT LUMBAR FOUR-FIVE;  Surgeon: Consuella Lose, MD;  Location: [facility];  Service: Neurosurgery;  Laterality: Left;  . ROUX-EN-Y PROCEDURE  04/1999   Social History   Tobacco Use  . Smoking status: Never Smoker  . Smokeless tobacco: Never Used  Substance Use Topics  . Alcohol use: Yes    Alcohol/week: 5.0 standard drinks    Types: 5 Glasses of wine per week   Marital Status: Married   Review of Systems  Cardiovascular: Negative for chest pain, dyspnea on exertion and leg swelling.  Gastrointestinal: Negative for melena.  Neurological: Positive for dizziness (occasional).      Objective:  Blood pressure 125/87, pulse 68, height 5' 9"  (1.753 m), weight 185 lb (83.9 kg), SpO2 100 %. Body mass index is 27.32 kg/m.  Orthostatic VS for the past 72 hrs (Last 3 readings):  Patient Position BP Location Cuff Size  04/22/20 0852 Sitting Left Arm  Normal      Physical Exam Vitals reviewed.  Constitutional:      Appearance: Normal appearance. She is well-developed.  Cardiovascular:     Rate and Rhythm: Normal rate and regular rhythm.     Pulses: Intact distal pulses.     Heart sounds: Normal heart sounds.  Pulmonary:     Effort: Pulmonary effort is normal. No accessory muscle usage or respiratory distress.     Breath sounds: Normal breath sounds.  Abdominal:     General: Bowel sounds are normal.     Palpations: Abdomen is soft.  Neurological:     Mental Status: She is oriented to person, place, and time.    Radiology: No results found.  Laboratory examination:    CMP Latest Ref Rng & Units 07/28/2019 04/02/2019 08/25/2018  Glucose 65 - 99 mg/dL 109(H) 83 116(H)  BUN 6 - 20 mg/dL 5(L) 8 7  Creatinine 0.57 - 1.00 mg/dL 0.74 0.75 0.64  Sodium 134 - 144 mmol/L 140 138 138  Potassium 3.5 - 5.2 mmol/L 4.5 4.0 3.4(L)  Chloride 96 - 106 mmol/L 104 106 107  CO2 20 - 29 mmol/L 22 25 23   Calcium 8.7 - 10.2 mg/dL 9.4 9.2 8.8(L)  Total Protein 6.5 - 8.1 g/dL - 7.8 6.7  Total Bilirubin 0.3 - 1.2 mg/dL - <0.1(L) 0.5  Alkaline Phos 38 - 126 U/L - 61 67  AST 15 - 41 U/L - 18 61(H)  ALT 0 - 44 U/L - 17 80(H)   CBC Latest Ref Rng & Units 04/02/2019 08/25/2018 08/24/2018  WBC 4.0 - 10.5 K/uL 8.0 6.9 6.7  Hemoglobin 12.0 - 15.0 g/dL 13.3 12.1 11.6(L)  Hematocrit 36 - 46 % 40.5 37.8 36.8  Platelets 150 - 400 K/uL 256 302 271   Lipid Panel     Component Value Date/Time   CHOL 187 11/30/2013 0210   TRIG 197 (H) 08/25/2018 0439   HDL 49 11/30/2013 0210   CHOLHDL 3.8 11/30/2013 0210   VLDL 21 11/30/2013 0210   LDLCALC 117 (H) 11/30/2013 0210    PRN Meds:. Medications Discontinued During This Encounter  Medication Reason  . OVER THE COUNTER MEDICATION Completed Course  . sucralfate (CARAFATE) 1 GM/10ML suspension Completed Course   Current Meds  Medication Sig  . acetaminophen (TYLENOL) 500 MG tablet Take 2 tablets (1,000 mg  total) by mouth every 8 (eight) hours.  Lorrin Mais CR 12.5 MG CR tablet Take 12.5 mg by mouth at bedtime.  . ARIPiprazole (ABILIFY) 10 MG tablet Take 10 mg by mouth daily.  . clonazePAM (KLONOPIN) 1 MG tablet Take 1 tablet (1 mg total) by mouth 2 (two) times daily as needed for anxiety. (Patient taking differently: Take 1 mg by mouth daily as needed for anxiety. )  . diltiazem (CARDIZEM CD) 180 MG 24 hr capsule Take 1 capsule (180 mg total) by mouth daily.  . hydrOXYzine (ATARAX/VISTARIL) 10 MG tablet Take 10 mg by mouth at bedtime.   . metoprolol succinate (TOPROL-XL) 50 MG 24 hr tablet Take 1 tablet (50 mg total) by mouth 2 (two) times daily. Take with or immediately following a meal.  .  montelukast (SINGULAIR) 10 MG tablet Take 10 mg by mouth at bedtime.  . ondansetron (ZOFRAN-ODT) 4 MG disintegrating tablet Take 1 tablet (4 mg total) by mouth every 6 (six) hours as needed for nausea or vomiting.  . pantoprazole (PROTONIX) 40 MG tablet Take 1 tablet (40 mg total) by mouth 2 (two) times daily.  . prochlorperazine (COMPAZINE) 5 MG tablet Take 1 tablet (5 mg total) by mouth every 6 (six) hours as needed for refractory nausea / vomiting (if not controlled with zofran).  . traZODone (DESYREL) 100 MG tablet Take 300 mg by mouth at bedtime.     Cardiac Studies:   01/21/2020: Normal sinus rhythm with rate of 63 bpm, normal axis.  No evidence of ischemia, normal EKG.    Assessment:   POTS (postural orthostatic tachycardia syndrome) - Plan: EKG 12-Lead  Palpitations  Recommendations:    Williette Loewe  is a 39 y.o. female  with hernitated ruptured cervical disc in 2015 and was placed on chronic steroid therapy, since then she developed Cushing's disease, significant weight gain. 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 She was having severe issues with nausea and vomiting, but eventually this is all settled down.  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  She has occasional episodes of dizziness when she is dehydrated.  No further palpitations.  She is presently on metoprolol and diltiazem with excellent control of her symptoms.  We discussed regarding keeping herself well-hydrated but otherwise no changes were done by me today.  I congratulated her on having lost weight and following a disciplined diet.  She has now returned to work again. I will see her back annually.    Adrian Prows, MD, [facility] 04/22/2020, 1:14 PM Office: [phone]

## 2020-04-25 ENCOUNTER — Other Ambulatory Visit: Payer: Self-pay | Admitting: Cardiology

## 2020-05-10 DIAGNOSIS — R112 Nausea with vomiting, unspecified: Secondary | ICD-10-CM | POA: Diagnosis not present

## 2020-05-10 DIAGNOSIS — R109 Unspecified abdominal pain: Secondary | ICD-10-CM | POA: Diagnosis not present

## 2020-05-12 DIAGNOSIS — F411 Generalized anxiety disorder: Secondary | ICD-10-CM | POA: Diagnosis not present

## 2020-05-12 DIAGNOSIS — F331 Major depressive disorder, recurrent, moderate: Secondary | ICD-10-CM | POA: Diagnosis not present

## 2020-05-13 DIAGNOSIS — R112 Nausea with vomiting, unspecified: Secondary | ICD-10-CM | POA: Diagnosis not present

## 2020-05-13 DIAGNOSIS — K56609 Unspecified intestinal obstruction, unspecified as to partial versus complete obstruction: Secondary | ICD-10-CM | POA: Diagnosis not present

## 2020-05-18 DIAGNOSIS — R112 Nausea with vomiting, unspecified: Secondary | ICD-10-CM | POA: Diagnosis not present

## 2020-05-27 ENCOUNTER — Other Ambulatory Visit: Payer: Self-pay | Admitting: Cardiology

## 2020-05-30 DIAGNOSIS — R1310 Dysphagia, unspecified: Secondary | ICD-10-CM | POA: Diagnosis not present

## 2020-05-30 DIAGNOSIS — E249 Cushing's syndrome, unspecified: Secondary | ICD-10-CM | POA: Diagnosis not present

## 2020-05-30 DIAGNOSIS — K900 Celiac disease: Secondary | ICD-10-CM | POA: Diagnosis not present

## 2020-05-30 DIAGNOSIS — D680 Von Willebrand's disease: Secondary | ICD-10-CM | POA: Diagnosis not present

## 2020-05-30 DIAGNOSIS — J45909 Unspecified asthma, uncomplicated: Secondary | ICD-10-CM | POA: Diagnosis not present

## 2020-05-30 DIAGNOSIS — Z79899 Other long term (current) drug therapy: Secondary | ICD-10-CM | POA: Diagnosis not present

## 2020-05-30 DIAGNOSIS — Z9884 Bariatric surgery status: Secondary | ICD-10-CM | POA: Diagnosis not present

## 2020-05-30 DIAGNOSIS — Z980 Intestinal bypass and anastomosis status: Secondary | ICD-10-CM | POA: Diagnosis not present

## 2020-05-30 DIAGNOSIS — R112 Nausea with vomiting, unspecified: Secondary | ICD-10-CM | POA: Diagnosis not present

## 2020-06-04 ENCOUNTER — Other Ambulatory Visit: Payer: Self-pay | Admitting: Cardiology

## 2020-06-14 DIAGNOSIS — F331 Major depressive disorder, recurrent, moderate: Secondary | ICD-10-CM | POA: Diagnosis not present

## 2020-06-27 ENCOUNTER — Other Ambulatory Visit: Payer: Self-pay | Admitting: Cardiology

## 2020-07-01 ENCOUNTER — Other Ambulatory Visit: Payer: Self-pay | Admitting: Cardiology

## 2020-08-10 DIAGNOSIS — F331 Major depressive disorder, recurrent, moderate: Secondary | ICD-10-CM | POA: Diagnosis not present

## 2020-08-10 DIAGNOSIS — F411 Generalized anxiety disorder: Secondary | ICD-10-CM | POA: Diagnosis not present

## 2020-09-14 DIAGNOSIS — F331 Major depressive disorder, recurrent, moderate: Secondary | ICD-10-CM | POA: Diagnosis not present

## 2020-09-14 DIAGNOSIS — F411 Generalized anxiety disorder: Secondary | ICD-10-CM | POA: Diagnosis not present

## 2020-10-11 DIAGNOSIS — Q763 Congenital scoliosis due to congenital bony malformation: Secondary | ICD-10-CM | POA: Diagnosis not present

## 2020-10-11 DIAGNOSIS — M47816 Spondylosis without myelopathy or radiculopathy, lumbar region: Secondary | ICD-10-CM | POA: Diagnosis not present

## 2020-10-13 DIAGNOSIS — M47816 Spondylosis without myelopathy or radiculopathy, lumbar region: Secondary | ICD-10-CM | POA: Diagnosis not present

## 2020-10-26 DIAGNOSIS — Z3189 Encounter for other procreative management: Secondary | ICD-10-CM | POA: Diagnosis not present

## 2020-11-03 DIAGNOSIS — M47816 Spondylosis without myelopathy or radiculopathy, lumbar region: Secondary | ICD-10-CM | POA: Diagnosis not present

## 2020-11-03 DIAGNOSIS — Q763 Congenital scoliosis due to congenital bony malformation: Secondary | ICD-10-CM | POA: Diagnosis not present

## 2020-11-09 DIAGNOSIS — F605 Obsessive-compulsive personality disorder: Secondary | ICD-10-CM | POA: Diagnosis not present

## 2020-11-09 DIAGNOSIS — F331 Major depressive disorder, recurrent, moderate: Secondary | ICD-10-CM | POA: Diagnosis not present

## 2020-11-09 DIAGNOSIS — F411 Generalized anxiety disorder: Secondary | ICD-10-CM | POA: Diagnosis not present

## 2020-11-17 DIAGNOSIS — N9089 Other specified noninflammatory disorders of vulva and perineum: Secondary | ICD-10-CM | POA: Diagnosis not present

## 2020-11-17 DIAGNOSIS — S30814A Abrasion of vagina and vulva, initial encounter: Secondary | ICD-10-CM | POA: Diagnosis not present

## 2020-11-29 ENCOUNTER — Other Ambulatory Visit: Payer: Self-pay

## 2020-11-29 ENCOUNTER — Emergency Department (HOSPITAL_BASED_OUTPATIENT_CLINIC_OR_DEPARTMENT_OTHER)
Admission: EM | Admit: 2020-11-29 | Discharge: 2020-11-29 | Disposition: A | Discharge status: Home / Self Care | Payer: BC Managed Care – PPO | Attending: Emergency Medicine | Admitting: Emergency Medicine

## 2020-11-29 ENCOUNTER — Encounter (HOSPITAL_BASED_OUTPATIENT_CLINIC_OR_DEPARTMENT_OTHER): Payer: Self-pay | Admitting: *Deleted

## 2020-11-29 ENCOUNTER — Emergency Department (HOSPITAL_BASED_OUTPATIENT_CLINIC_OR_DEPARTMENT_OTHER): Payer: BC Managed Care – PPO

## 2020-11-29 DIAGNOSIS — N200 Calculus of kidney: Secondary | ICD-10-CM | POA: Diagnosis not present

## 2020-11-29 DIAGNOSIS — R112 Nausea with vomiting, unspecified: Secondary | ICD-10-CM | POA: Insufficient documentation

## 2020-11-29 DIAGNOSIS — Z79899 Other long term (current) drug therapy: Secondary | ICD-10-CM | POA: Insufficient documentation

## 2020-11-29 DIAGNOSIS — R1013 Epigastric pain: Secondary | ICD-10-CM | POA: Insufficient documentation

## 2020-11-29 DIAGNOSIS — J45909 Unspecified asthma, uncomplicated: Secondary | ICD-10-CM | POA: Diagnosis not present

## 2020-11-29 LAB — COMPREHENSIVE METABOLIC PANEL
ALT: 14 U/L (ref 0–44)
AST: 15 U/L (ref 15–41)
Albumin: 4.3 g/dL (ref 3.5–5.0)
Alkaline Phosphatase: 58 U/L (ref 38–126)
Anion gap: 10 (ref 5–15)
BUN: 12 mg/dL (ref 6–20)
CO2: 25 mmol/L (ref 22–32)
Calcium: 9.3 mg/dL (ref 8.9–10.3)
Chloride: 103 mmol/L (ref 98–111)
Creatinine, Ser: 0.76 mg/dL (ref 0.44–1.00)
GFR, Estimated: >60 mL/min (ref >60)
Glucose, Bld: 97 mg/dL (ref 70–99)
Potassium: 3.8 mmol/L (ref 3.5–5.1)
Sodium: 138 mmol/L (ref 135–145)
Total Bilirubin: 0.1 mg/dL — ABNORMAL LOW (ref 0.3–1.2)
Total Protein: 7.7 g/dL (ref 6.5–8.1)

## 2020-11-29 LAB — CBC WITH DIFFERENTIAL/PLATELET
Abs Immature Granulocytes: 0.01 10*3/uL (ref 0.00–0.07)
Basophils Absolute: 0 10*3/uL (ref 0.0–0.1)
Basophils Relative: 0 %
Eosinophils Absolute: 0.1 10*3/uL (ref 0.0–0.5)
Eosinophils Relative: 1 %
HCT: 37.3 % (ref 36.0–46.0)
Hemoglobin: 12.5 g/dL (ref 12.0–15.0)
Immature Granulocytes: 0 %
Lymphocytes Relative: 32 %
Lymphs Abs: 1.9 10*3/uL (ref 0.7–4.0)
MCH: 31.6 pg (ref 26.0–34.0)
MCHC: 33.5 g/dL (ref 30.0–36.0)
MCV: 94.2 fL (ref 80.0–100.0)
Monocytes Absolute: 0.5 10*3/uL (ref 0.1–1.0)
Monocytes Relative: 8 %
Neutro Abs: 3.6 10*3/uL (ref 1.7–7.7)
Neutrophils Relative %: 59 %
Platelets: 241 10*3/uL (ref 150–400)
RBC: 3.96 MIL/uL (ref 3.87–5.11)
RDW: 12.9 % (ref 11.5–15.5)
WBC: 6.1 10*3/uL (ref 4.0–10.5)
nRBC: 0 % (ref 0.0–0.2)

## 2020-11-29 LAB — URINALYSIS, ROUTINE W REFLEX MICROSCOPIC
Bilirubin Urine: NEGATIVE
Glucose, UA: NEGATIVE mg/dL
Hgb urine dipstick: NEGATIVE
Ketones, ur: NEGATIVE mg/dL
Leukocytes,Ua: NEGATIVE
Nitrite: NEGATIVE
Protein, ur: NEGATIVE mg/dL
Specific Gravity, Urine: 1.01 (ref 1.005–1.030)
pH: 6.5 (ref 5.0–8.0)

## 2020-11-29 LAB — LIPASE, BLOOD: Lipase: 39 U/L (ref 11–51)

## 2020-11-29 LAB — PREGNANCY, URINE: Preg Test, Ur: NEGATIVE

## 2020-11-29 MED ORDER — IOHEXOL 300 MG/ML  SOLN: 100.0000 mL | Freq: Once | INTRAMUSCULAR | Status: DC | PRN

## 2020-11-29 MED ORDER — HALOPERIDOL LACTATE 5 MG/ML IJ SOLN
1.0000 mg | Freq: Once | INTRAMUSCULAR | Status: AC
  Administered 2020-11-29: 1 mg via INTRAVENOUS
  Filled 2020-11-29: qty 1

## 2020-11-29 MED ORDER — MORPHINE SULFATE (PF) 4 MG/ML IV SOLN
4.0000 mg | Freq: Once | INTRAVENOUS | Status: AC
  Administered 2020-11-29: 4 mg via INTRAVENOUS
  Filled 2020-11-29: qty 1

## 2020-11-29 MED ORDER — PROMETHAZINE HCL 25 MG/ML IJ SOLN
25.0000 mg | Freq: Once | INTRAMUSCULAR | Status: AC
  Administered 2020-11-29: 25 mg via INTRAVENOUS
  Filled 2020-11-29: qty 1

## 2020-11-29 MED ORDER — SODIUM CHLORIDE 0.9 % IV BOLUS
1000.0000 mL | Freq: Once | INTRAVENOUS | Status: AC
  Administered 2020-11-29: 1000 mL via INTRAVENOUS

## 2020-11-29 MED ORDER — HALOPERIDOL 10 MG PO TABS: 10.0000 mg | ORAL_TABLET | Freq: Two times a day (BID) | ORAL | 0 refills | Status: DC | PRN

## 2020-11-29 NOTE — Discharge Instructions (Addendum)
Stick to a liquid and soft food diet for the next 48 hours.  Follow up with your GI doctor's clinic.

## 2020-11-29 NOTE — ED Provider Notes (Signed)
[facility] EMERGENCY DEPARTMENT Provider Note   CSN: [id number] Arrival date & time: 11/29/20  1437     History Chief Complaint  Patient presents with  . Abdominal Pain    Marcille Barman is a 40 y.o. 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  She reports abrupt onset of abdominal pain 2 days ago.  She says it is sharp pain in her epigastrium that radiates her back.  She said it feels reminiscent of her pancreatitis in the past.  She reports has not been able to keep down any food, fluids or medicines, and has been vomiting immediately after taking them.  She called her GI doctor he was at [facility], who advised that she come to a local emergency department for some testing.  They are not able to see her in the office today.  She reports she has had a Covid vaccines.  She denies cough, congestion, sore throat, headache, fevers or chills.  She reports she has had a loose bowel movements throughout the course of the day.  She does report a history of bowel obstructions but does not have any of those active symptoms per her report.  She has allergies to Reglan and sulfa drugs.  HPI    Past Medical History:  Diagnosis Date  . Acute appendicitis 02/28/2017  . Acute pancreatitis 11/30/2013  . Anemia 11/26/2011  . Anxiety   . Asthma   . Celiac and mesenteric artery injury   . Cushing's syndrome ([place])    06/21/16- "in remission"  . Depression   . H/O hiatal hernia   . History of kidney stones   . Iatrogenic Cushing's syndrome ([place]) 10/02/2013  . Nausea vomiting and diarrhea 11/23/2013  . Nausea with vomiting 11/30/2013  . Palpitations 12/05/2018  . Pancreatitis 11/29/2013  . POTS (postural orthostatic  tachycardia syndrome)   . Shortness of breath dyspnea    with exertertion  . Sphincter of Oddi dysfunction   . Von Willebrand disease ([place])    bruising easy    Patient Active Problem List   Diagnosis Date Noted  . Palpitations 12/05/2018  . 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 HNP (herniated nucleus pulposus), lumbar 06/22/2016  . Hypomagnesemia 11/25/2013  . Anemia of chronic disease 11/23/2013  . Endometriosis 09/01/2013  . Celiac disease 09/01/2013  . Asthma, chronic 09/01/2013  . Superior mesenteric artery syndrome ([place]) 09/01/2013  . Abnormal LFTs 07/15/2012  . POTS (postural orthostatic tachycardia syndrome) 11/26/2011    Past Surgical History:  Procedure Laterality Date  . ABDOMINAL HYSTERECTOMY  08/2010  . ANTERIOR CERVICAL DECOMP/DISCECTOMY FUSION N/A 03/31/2013   Procedure: ANTERIOR CERVICAL DECOMPRESSION/DISCECTOMY FUSION 1 LEVEL Cervical five-six;  Surgeon: Faythe Ghee, MD;  Location: [facility];  Service: Neurosurgery;  Laterality: N/A;  . APPENDECTOMY    . Cholangio-Pancreatography with Spintectorotomy + Stent  07/16/2012   01/15/14  . CHOLECYSTECTOMY    . ESOPHAGOGASTRODUODENOSCOPY (EGD) WITH PROPOFOL N/A 08/19/2018   Procedure: ESOPHAGOGASTRODUODENOSCOPY (EGD) WITH PROPOFOL;  Surgeon: Wilford Corner, MD;  [facility];  Service: Endoscopy;  Laterality: N/A;  . LAPAROSCOPIC APPENDECTOMY N/A 02/28/2017   Procedure: APPENDECTOMY LAPAROSCOPIC;  Surgeon: Erroll Luna, MD;  [facility];  Service: General;  Laterality: N/A;  . LAPAROSCOPIC ENDOMETRIOSIS FULGURATION    . LAPAROSCOPIC GASTRIC SLEEVE RESECTION  N/A 07/15/2018   Procedure: LAPAROSCOPIC GASTRIC SLEEVE RESECTION, UPPER ENDO, ERAS Pathway;  Surgeon: Johnathan Hausen, MD;  [facility];  Service: General;  Laterality: N/A;  . LUMBAR LAMINECTOMY    . LUMBAR LAMINECTOMY/DECOMPRESSION MICRODISCECTOMY Left 06/22/2016   Procedure: MICRODISCECTOMY LEFT LUMBAR FOUR-FIVE;   Surgeon: Consuella Lose, MD;  Location: [facility];  Service: Neurosurgery;  Laterality: Left;  . ROUX-EN-Y PROCEDURE  04/1999     OB History   No obstetric history on file.     Family History  Problem Relation Age of Onset  . Hypertension Mother     Social History   Tobacco Use  . Smoking status: Never Smoker  . Smokeless tobacco: Never Used  Vaping Use  . Vaping Use: Never used  Substance Use Topics  . Alcohol use: Yes    Alcohol/week: 5.0 standard drinks    Types: 5 Glasses of wine per week  . Drug use: No    Home Medications Prior to Admission medications   Medication Sig Start Date End Date Taking? Authorizing Provider  haloperidol (HALDOL) 10 MG tablet Take 1 tablet (10 mg total) by mouth 2 (two) times daily as needed for up to 5 doses. 11/29/20  Yes Evian Derringer, Carola Rhine, MD  acetaminophen (TYLENOL) 500 MG tablet Take 2 tablets (1,000 mg total) by mouth every 8 (eight) hours. 08/25/18   Meuth, Brooke A, PA-C  AMBIEN CR 12.5 MG CR tablet Take 12.5 mg by mouth at bedtime. 10/31/15   [provider]  ARIPiprazole (ABILIFY) 10 MG tablet Take 10 mg by mouth daily.    [provider]  clonazePAM (KLONOPIN) 1 MG tablet Take 1 tablet (1 mg total) by mouth 2 (two) times daily as needed for anxiety. Patient taking differently: Take 1 mg by mouth daily as needed for anxiety.  12/07/13   Theodis Blaze, MD  diltiazem (CARDIZEM CD) 180 MG 24 hr capsule TAKE 1 CAPSULE DAILY. 06/27/20   Adrian Prows, MD  hydrOXYzine (ATARAX/VISTARIL) 10 MG tablet Take 10 mg by mouth at bedtime.     [provider]  metoprolol succinate (TOPROL-XL) 50 MG 24 hr tablet TAKE ONE TABLET TWICE A DAY WITH OR IMMEDIATELY FOLLOWING A MEAL. 07/01/20   Adrian Prows, MD  montelukast (SINGULAIR) 10 MG tablet Take 10 mg by mouth at bedtime.    [provider]  ondansetron (ZOFRAN-ODT) 4 MG disintegrating tablet Take 1 tablet (4 mg total) by mouth every 6 (six) hours as needed for nausea or  vomiting. 08/25/18   Meuth, Brooke A, PA-C  pantoprazole (PROTONIX) 40 MG tablet Take 1 tablet (40 mg total) by mouth 2 (two) times daily. 08/25/18   Meuth, Brooke A, PA-C  prochlorperazine (COMPAZINE) 5 MG tablet Take 1 tablet (5 mg total) by mouth every 6 (six) hours as needed for refractory nausea / vomiting (if not controlled with zofran). 08/25/18   Meuth, Brooke A, PA-C  traZODone (DESYREL) 100 MG tablet Take 300 mg by mouth at bedtime.     [provider]    Allergies    Bee venom, Reglan [metoclopramide], Shellfish allergy, Wheat bran, Adhesive [tape], Gluten meal, Sulfa antibiotics, Tartrazine, and Yellow dye  Review of Systems   Review of Systems  Constitutional: Negative for chills and fever.  HENT: Negative for ear pain and sore throat.   Eyes: Negative for pain and visual disturbance.  Respiratory: Negative for cough and shortness of breath.   Cardiovascular: Negative for chest pain and palpitations.  Gastrointestinal: Positive  for abdominal pain, nausea and vomiting.  Genitourinary: Negative for dysuria and hematuria.  Musculoskeletal: Positive for back pain. Negative for arthralgias.  Skin: Negative for color change and rash.  Neurological: Negative for syncope, light-headedness and headaches.  All other systems reviewed and are negative.   Physical Exam Updated Vital Signs BP 111/77 (BP Location: Right Arm)   Pulse 69   Temp 97.8 F (36.6 C) (Oral)   Resp 18   Ht 5' 9"  (1.753 m)   Wt 80.7 kg   SpO2 100%   BMI 26.29 kg/m   Physical Exam Constitutional:      General: She is not in acute distress. HENT:     Head: Normocephalic and atraumatic.  Eyes:     Conjunctiva/sclera: Conjunctivae normal.     Pupils: Pupils are equal, round, and reactive to light.  Cardiovascular:     Rate and Rhythm: Normal rate and regular rhythm.  Pulmonary:     Effort: Pulmonary effort is normal. No respiratory distress.  Abdominal:     General: There is no distension.      Tenderness: There is abdominal tenderness in the epigastric area. Negative signs include Dayal's sign and McBurney's sign.  Skin:    General: Skin is warm and dry.  Neurological:     General: No focal deficit present.     Mental Status: She is alert. Mental status is at baseline.  Psychiatric:        Mood and Affect: Mood normal.        Behavior: Behavior normal.     ED Results / Procedures / Treatments   Labs (all labs ordered are listed, but only abnormal results are displayed) Labs Reviewed  COMPREHENSIVE METABOLIC PANEL - Abnormal; Notable for the following components:      Result Value   Total Bilirubin 0.1 (*)    All other components within normal limits  URINALYSIS, ROUTINE W REFLEX MICROSCOPIC  CBC WITH DIFFERENTIAL/PLATELET  LIPASE, BLOOD  PREGNANCY, URINE    EKG None  Radiology No results found.  Procedures Procedures   Medications Ordered in ED Medications  morphine 4 MG/ML injection 4 mg (4 mg Intravenous Given 11/29/20 1534)  sodium chloride 0.9 % bolus 1,000 mL ( Intravenous Stopped 11/29/20 1628)  promethazine (PHENERGAN) injection 25 mg (25 mg Intravenous Given 11/29/20 1528)  haloperidol lactate (HALDOL) injection 1 mg (1 mg Intravenous Given 11/29/20 1701)    ED Course  I have reviewed the triage vital signs and the nursing notes.  Pertinent labs & imaging results that were available during my care of the patient were reviewed by me and considered in my medical decision making (see chart for details).  This patient presents to the Emergency Department with complaint of abdominal pain. This involves an extensive number of treatment options, and is a complaint that carries with it a high risk of complications and morbidity.  The differential diagnosis includes gastritis vs pancreatitis vs SBO vs colitis vs UTI vs other  I ordered, reviewed, and interpreted labs, which were unremarkable, and not indicative of acute pancreatitis, hepatitis, biliary  disease, or infection. I ordered medication IV morphine, IV phenergan, IV haldol, IV fluids for abdominal pain and/or nausea Previous records obtained and reviewed showing outpatient records  I initially ordered a CT abdomen pelvis with her persistent vomiting, with inclination to rule out bowel obstruction.  However she subsequently spoke to her GI doctor on the phone, who reported she has a hx of gastric paresis or outlet obstruction, and  recommended home management if she can tolerate PO.  I reassessed the patient and found her nausea had improved with haldol, she could tolerate PO fluids, and she reported she had been passing gas and having stools all day.  Therefore I had a lower suspicion for bowel obstruction, and thought it was reasonable to forego another CT scan at this time.  Okay for discharge with GI follow up.  Clinical Course as of 11/30/20 0045  Tue Nov 29, 2020  1657 Still vomiting after meds, we'll try haldol (she tells me her GI doctor has prescribed this before to her, and she has no adverse reactions).  CT abdomen ordered for obstruction evaluation [MT]  1852 Tolerating PO, wants to go home.  I think this is reasonable.  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  I explained I do think this is a possibility and clinically fits.  Her labs are benign, and her symptoms improved significantly after haldol, enough that she tolerated water and ginger ale and is feeling better.  I cancelled the CT scan, and I will discharge her home.   [MT]    Clinical Course User Index [MT] Wyvonnia Dusky, MD    Final Clinical Impression(s) / ED Diagnoses Final diagnoses:  Non-intractable vomiting with nausea, unspecified vomiting type    Rx / DC Orders ED Discharge Orders         Ordered    haloperidol (HALDOL) 10 MG tablet  2 times daily PRN        11/29/20 1858           Wyvonnia Dusky, MD 11/30/20 0045

## 2020-11-29 NOTE — ED Notes (Signed)
Spoke with Bland Span in lab to add on u-preg

## 2020-11-29 NOTE — ED Notes (Signed)
C/o nausea.  Dry heaves noted.  Provider made aware.

## 2020-11-29 NOTE — ED Notes (Signed)
Pt ambulatory with steady gait to restroom 

## 2020-11-29 NOTE — ED Triage Notes (Addendum)
C/o left upper abd pain with n/v  x 4 days , no relief with phenergan and zofan, pt is being followed by [facility]

## 2020-11-29 NOTE — ED Notes (Signed)
Presents with abd pain, states approx 0300hrs this am began having nausea, antiemetic was taken but did not resolve the nausea and vomiting, states she was unable to keep "anything down". Capillary refill WNL, skin W&D, strong radial pulses. Alert and oriented x 4. IV established, IVF initiated and lab tubes obtained per order, comfort measures provided.

## 2020-12-26 ENCOUNTER — Other Ambulatory Visit (HOSPITAL_COMMUNITY): Payer: Self-pay | Admitting: Occupational Medicine

## 2020-12-26 ENCOUNTER — Other Ambulatory Visit: Payer: Self-pay | Admitting: Occupational Medicine

## 2020-12-26 DIAGNOSIS — M5412 Radiculopathy, cervical region: Secondary | ICD-10-CM

## 2020-12-27 ENCOUNTER — Other Ambulatory Visit: Payer: Self-pay

## 2020-12-27 ENCOUNTER — Ambulatory Visit (HOSPITAL_COMMUNITY)
Admission: RE | Admit: 2020-12-27 | Discharge: 2020-12-27 | Disposition: A | Discharge status: Home / Self Care | Payer: PRIVATE HEALTH INSURANCE | Source: Ambulatory Visit | Attending: Occupational Medicine | Admitting: Occupational Medicine

## 2020-12-27 DIAGNOSIS — M5412 Radiculopathy, cervical region: Secondary | ICD-10-CM | POA: Diagnosis not present

## 2021-01-02 DIAGNOSIS — M5412 Radiculopathy, cervical region: Secondary | ICD-10-CM | POA: Diagnosis not present

## 2021-01-02 DIAGNOSIS — M47812 Spondylosis without myelopathy or radiculopathy, cervical region: Secondary | ICD-10-CM | POA: Diagnosis not present

## 2021-01-04 DIAGNOSIS — M5412 Radiculopathy, cervical region: Secondary | ICD-10-CM | POA: Diagnosis not present

## 2021-01-05 ENCOUNTER — Encounter: Payer: Self-pay | Admitting: Cardiology

## 2021-01-06 DIAGNOSIS — M5412 Radiculopathy, cervical region: Secondary | ICD-10-CM | POA: Diagnosis not present

## 2021-01-06 DIAGNOSIS — Z981 Arthrodesis status: Secondary | ICD-10-CM | POA: Diagnosis not present

## 2021-01-06 DIAGNOSIS — M4802 Spinal stenosis, cervical region: Secondary | ICD-10-CM | POA: Diagnosis not present

## 2021-01-06 DIAGNOSIS — M50123 Cervical disc disorder at C6-C7 level with radiculopathy: Secondary | ICD-10-CM | POA: Diagnosis not present

## 2021-01-24 ENCOUNTER — Encounter (HOSPITAL_COMMUNITY): Payer: Self-pay | Admitting: *Deleted

## 2021-01-30 DIAGNOSIS — M47812 Spondylosis without myelopathy or radiculopathy, cervical region: Secondary | ICD-10-CM | POA: Diagnosis not present

## 2021-02-07 DIAGNOSIS — F411 Generalized anxiety disorder: Secondary | ICD-10-CM | POA: Diagnosis not present

## 2021-02-07 DIAGNOSIS — F605 Obsessive-compulsive personality disorder: Secondary | ICD-10-CM | POA: Diagnosis not present

## 2021-02-07 DIAGNOSIS — F331 Major depressive disorder, recurrent, moderate: Secondary | ICD-10-CM | POA: Diagnosis not present

## 2021-02-09 DIAGNOSIS — R222 Localized swelling, mass and lump, trunk: Secondary | ICD-10-CM | POA: Diagnosis not present

## 2021-02-09 DIAGNOSIS — L859 Epidermal thickening, unspecified: Secondary | ICD-10-CM | POA: Diagnosis not present

## 2021-03-14 DIAGNOSIS — R293 Abnormal posture: Secondary | ICD-10-CM | POA: Diagnosis not present

## 2021-03-14 DIAGNOSIS — M25611 Stiffness of right shoulder, not elsewhere classified: Secondary | ICD-10-CM | POA: Diagnosis not present

## 2021-03-14 DIAGNOSIS — M62511 Muscle wasting and atrophy, not elsewhere classified, right shoulder: Secondary | ICD-10-CM | POA: Diagnosis not present

## 2021-03-14 DIAGNOSIS — M25511 Pain in right shoulder: Secondary | ICD-10-CM | POA: Diagnosis not present

## 2021-03-15 DIAGNOSIS — M25611 Stiffness of right shoulder, not elsewhere classified: Secondary | ICD-10-CM | POA: Diagnosis not present

## 2021-03-15 DIAGNOSIS — M62511 Muscle wasting and atrophy, not elsewhere classified, right shoulder: Secondary | ICD-10-CM | POA: Diagnosis not present

## 2021-03-15 DIAGNOSIS — M25511 Pain in right shoulder: Secondary | ICD-10-CM | POA: Diagnosis not present

## 2021-03-15 DIAGNOSIS — R293 Abnormal posture: Secondary | ICD-10-CM | POA: Diagnosis not present

## 2021-04-06 ENCOUNTER — Other Ambulatory Visit (HOSPITAL_COMMUNITY): Payer: Self-pay | Admitting: Neurosurgery

## 2021-04-06 ENCOUNTER — Ambulatory Visit (HOSPITAL_COMMUNITY)
Admission: RE | Admit: 2021-04-06 | Discharge: 2021-04-06 | Disposition: A | Discharge status: Home / Self Care | Payer: BC Managed Care – PPO | Source: Ambulatory Visit | Attending: Neurosurgery | Admitting: Neurosurgery

## 2021-04-06 DIAGNOSIS — G8929 Other chronic pain: Secondary | ICD-10-CM | POA: Insufficient documentation

## 2021-04-06 DIAGNOSIS — M25511 Pain in right shoulder: Secondary | ICD-10-CM | POA: Diagnosis not present

## 2021-04-11 ENCOUNTER — Ambulatory Visit (INDEPENDENT_AMBULATORY_CARE_PROVIDER_SITE_OTHER): Payer: BC Managed Care – PPO | Admitting: Orthopaedic Surgery

## 2021-04-11 ENCOUNTER — Encounter: Payer: Self-pay | Admitting: Orthopaedic Surgery

## 2021-04-11 DIAGNOSIS — G8929 Other chronic pain: Secondary | ICD-10-CM | POA: Diagnosis not present

## 2021-04-11 DIAGNOSIS — M25511 Pain in right shoulder: Secondary | ICD-10-CM | POA: Diagnosis not present

## 2021-04-11 MED ORDER — LIDOCAINE HCL 1 % IJ SOLN
3.0000 mL | INTRAMUSCULAR | Status: AC | PRN
  Administered 2021-04-11: 3 mL

## 2021-04-11 MED ORDER — BUPIVACAINE HCL 0.5 % IJ SOLN
3.0000 mL | INTRAMUSCULAR | Status: AC | PRN
  Administered 2021-04-11: 3 mL via INTRA_ARTICULAR

## 2021-04-11 NOTE — Progress Notes (Signed)
Office Visit Note   Patient: Kaitlyn Johnson           Date of Birth: Dec 17, 1980           MRN: [id number] Visit Date: 04/11/2021              Requested by: Maude Leriche, PA-C [place],  [place] [id number] PCP: Maude Leriche, PA-C   Assessment & Plan: Visit Diagnoses:  1. Chronic right shoulder pain     Plan: Based on findings I feel her symptoms are more consistent with myofascial syndrome.  However she did have some mild subacromial bursitis on the MRI which was the only finding.  She did agree to try a subacromial injection with Toradol to see if this will help with the pain in case the pain is referred from this.  I have also updated her prescription for physical therapy for dry needling and modalities as indicated at [facility].  She will follow-up with [place] in about 4 weeks if she does not notice any improvement.  Follow-Up Instructions: Return if symptoms worsen or fail to improve.   Orders:  No orders of the defined types were placed in this encounter.  No orders of the defined types were placed in this encounter.     Procedures: Large Joint Inj: R subacromial bursa on 04/11/2021 11:24 AM Indications: pain Details: 22 G needle  Arthrogram: No  Medications: 3 mL lidocaine 1 %; 3 mL bupivacaine 0.5 % Outcome: tolerated well, no immediate complications Consent was given by the patient. Patient was prepped and draped in the usual sterile fashion.      Clinical Data: No additional findings.   Subjective: Chief Complaint  Patient presents with   Right Shoulder - Pain    Shiela is a 40 year old female here for evaluation of chronic right shoulder pain referral from Dr. Kathyrn Sheriff.  She is 10 weeks status post ACDF.  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  She is currently fighting with Worker's  Compensation to get this claim excepted.  She has done physical therapy at [facility] without any relief in the shoulder.  She is feels numbness down the backside of her arm.  She feels some limitation range of motion secondary to pain.  Currently out of work.  Her family owns [employer].   Review of Systems  Constitutional: Negative.   HENT: Negative.    Eyes: Negative.   Respiratory: Negative.    Cardiovascular: Negative.   Endocrine: Negative.   Musculoskeletal: Negative.   Neurological: Negative.   Hematological: Negative.   Psychiatric/Behavioral: Negative.    All other systems reviewed and are negative.   Objective: Vital Signs: There were no vitals taken for this visit.  Physical Exam Vitals and nursing note reviewed.  Constitutional:      Appearance: She is well-developed.  HENT:     Head: Normocephalic and atraumatic.  Pulmonary:     Effort: Pulmonary effort is normal.  Abdominal:     Palpations: Abdomen is soft.  Musculoskeletal:     Cervical back: Neck supple.  Skin:    General: Skin is warm.     Capillary Refill: Capillary refill takes less than 2 seconds.  Neurological:     Mental Status: She is alert and oriented to person, place, and time.  Psychiatric:  Behavior: Behavior normal.        Thought Content: Thought content normal.        Judgment: Judgment normal.    Ortho Exam Right shoulder shows full passive range of motion with moderate pain and guarding.  Active range of motion is limited secondary to guarding.  Manual muscle testing of the rotator cuff is normal.  Negative Hawkins sign.  Negative Neer sign.  Negative cross body adduction.  Negative Speed.  Negative O'Brien. Specialty Comments:  No specialty comments available.  Imaging: No results found.   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   Abnormal LFTs 07/15/2012   POTS (postural orthostatic tachycardia syndrome) 11/26/2011   Past Medical History:  Diagnosis Date   Acute appendicitis 02/28/2017   Acute pancreatitis 11/30/2013   Anemia 11/26/2011   Anxiety    Asthma    Celiac and mesenteric artery injury    Cushing's syndrome ([place])    06/21/16- "in remission"   Depression    H/O hiatal hernia    History of kidney stones    Iatrogenic Cushing's syndrome ([place]) 10/02/2013   Nausea vomiting and diarrhea 11/23/2013   Nausea with vomiting 11/30/2013   Palpitations 12/05/2018   Pancreatitis 11/29/2013   POTS (postural orthostatic tachycardia syndrome)    Shortness of breath dyspnea    with exertertion   Sphincter of Oddi dysfunction    Von Willebrand disease ([place])    bruising easy    Family History  Problem Relation Age of Onset   Hypertension Mother     Past Surgical History:  Procedure Laterality Date   ABDOMINAL HYSTERECTOMY  08/2010   ANTERIOR CERVICAL DECOMP/DISCECTOMY FUSION N/A 03/31/2013   Procedure: ANTERIOR CERVICAL DECOMPRESSION/DISCECTOMY FUSION 1 LEVEL Cervical five-six;  Surgeon: Faythe Ghee, MD;  Location: [facility];  Service: Neurosurgery;  Laterality: N/A;   APPENDECTOMY     Cholangio-Pancreatography with Spintectorotomy + Stent  07/16/2012   01/15/14   CHOLECYSTECTOMY     ESOPHAGOGASTRODUODENOSCOPY (EGD) WITH PROPOFOL N/A 08/19/2018   Procedure: ESOPHAGOGASTRODUODENOSCOPY (EGD) WITH PROPOFOL;  Surgeon: Wilford Corner, MD;  Location: [facility];  Service: Endoscopy;  Laterality: N/A;   LAPAROSCOPIC APPENDECTOMY N/A 02/28/2017   Procedure: APPENDECTOMY LAPAROSCOPIC;  Surgeon: Erroll Luna, MD;  Location: [facility];  Service: General;  Laterality: N/A;    LAPAROSCOPIC ENDOMETRIOSIS FULGURATION     LAPAROSCOPIC GASTRIC SLEEVE RESECTION N/A 07/15/2018   Procedure: LAPAROSCOPIC GASTRIC SLEEVE RESECTION, UPPER ENDO, ERAS Pathway;  Surgeon: Johnathan Hausen, MD;  Location: [facility];  Service: General;  Laterality: N/A;   LUMBAR LAMINECTOMY     LUMBAR LAMINECTOMY/DECOMPRESSION MICRODISCECTOMY Left 06/22/2016   Procedure: MICRODISCECTOMY LEFT LUMBAR FOUR-FIVE;  Surgeon: Consuella Lose, MD;  Location: [facility];  Service: Neurosurgery;  Laterality: Left;   ROUX-EN-Y PROCEDURE  04/1999   Social History   Occupational History   Not on file  Tobacco Use   Smoking status: Never   Smokeless tobacco: Never  Vaping Use   Vaping Use: Never used  Substance and Sexual Activity   Alcohol use: Yes    Alcohol/week: 5.0 standard drinks    Types: 5 Glasses of wine per week   Drug use:  No   Sexual activity: Yes    Partners: Male    Birth control/protection: Surgical

## 2021-04-24 ENCOUNTER — Other Ambulatory Visit: Payer: Self-pay

## 2021-04-24 ENCOUNTER — Ambulatory Visit: Payer: BC Managed Care – PPO | Admitting: Cardiology

## 2021-04-24 ENCOUNTER — Encounter: Payer: Self-pay | Admitting: Cardiology

## 2021-04-24 VITALS — BP 129/87 | HR 83 | Temp 98.4°F | Resp 17 | Ht 69.0 in | Wt 194.0 lb

## 2021-04-24 DIAGNOSIS — I498 Other specified cardiac arrhythmias: Secondary | ICD-10-CM | POA: Diagnosis not present

## 2021-04-24 DIAGNOSIS — R002 Palpitations: Secondary | ICD-10-CM | POA: Diagnosis not present

## 2021-04-24 DIAGNOSIS — G90A Postural orthostatic tachycardia syndrome (POTS): Secondary | ICD-10-CM

## 2021-04-24 NOTE — Progress Notes (Signed)
Primary Physician:  Maude Leriche, PA-C   Patient ID: Kaitlyn Johnson, female    DOB: June 22, 1981, 40 y.o.   MRN: [id number]  Subjective:    Chief Complaint  Patient presents with   Palpitations    1 year    HPI: Kaitlyn Johnson  is a 40 y.o. female  with hernitated ruptured cervical disc in 2015 and was placed on chronic steroid therapy, since then she developed Cushing's disease, significant weight gain. 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   She has occasional episodes of dizziness when she is dehydrated.  No further palpitations. Still having issues with nausea and dehydration but states since changing her GI medications, symptoms are well controlled.   Patient has a diagnosis of POTS, celiac disease, superior mesenteric artery syndrome. She is on chronic Metoprolol and Dilt for POTS, recommended regular follow up with Cardiology.   Past Medical History:  Diagnosis Date   Acute appendicitis 02/28/2017   Acute pancreatitis 11/30/2013   Anemia 11/26/2011   Anxiety    Asthma    Celiac and mesenteric artery injury    Cushing's syndrome ([place])    06/21/16- "in remission"   Depression    H/O hiatal hernia    History of kidney stones    Iatrogenic Cushing's syndrome ([place]) 10/02/2013   Nausea vomiting and diarrhea 11/23/2013   Nausea with vomiting 11/30/2013   Palpitations 12/05/2018   Pancreatitis 11/29/2013   POTS (postural orthostatic tachycardia syndrome)    Shortness of breath dyspnea    with exertertion   Sphincter of Oddi dysfunction    Von Willebrand disease ([place])    bruising easy    Past Surgical History:  Procedure Laterality Date   ABDOMINAL HYSTERECTOMY  08/2010   ANTERIOR CERVICAL DECOMP/DISCECTOMY FUSION N/A 03/31/2013   Procedure: ANTERIOR CERVICAL DECOMPRESSION/DISCECTOMY FUSION 1 LEVEL Cervical five-six;  Surgeon: Faythe Ghee, MD;  Location: [facility];  Service: Neurosurgery;  Laterality: N/A;   APPENDECTOMY      Cholangio-Pancreatography with Spintectorotomy + Stent  07/16/2012   01/15/14   CHOLECYSTECTOMY     ESOPHAGOGASTRODUODENOSCOPY (EGD) WITH PROPOFOL N/A 08/19/2018   Procedure: ESOPHAGOGASTRODUODENOSCOPY (EGD) WITH PROPOFOL;  Surgeon: Wilford Corner, MD;  Location[facility];  Service: Endoscopy;  Laterality: N/A;   LAPAROSCOPIC APPENDECTOMY N/A 02/28/2017   Procedure: APPENDECTOMY LAPAROSCOPIC;  Surgeon: Erroll Luna, MD;  Location[facility];  Service: General;  Laterality: N/A;   LAPAROSCOPIC ENDOMETRIOSIS FULGURATION     LAPAROSCOPIC GASTRIC SLEEVE RESECTION N/A 07/15/2018   Procedure: LAPAROSCOPIC GASTRIC SLEEVE RESECTION, UPPER ENDO, ERAS Pathway;  Surgeon: Johnathan Hausen, MD;  Location: [facility];  Service: General;  Laterality: N/A;   LUMBAR LAMINECTOMY     LUMBAR LAMINECTOMY/DECOMPRESSION MICRODISCECTOMY Left 06/22/2016   Procedure: MICRODISCECTOMY LEFT LUMBAR FOUR-FIVE;  Surgeon: Consuella Lose, MD;  Location: [facility];  Service: Neurosurgery;  Laterality: Left;   ROUX-EN-Y PROCEDURE  04/1999   Social History   Tobacco Use   Smoking status: Never   Smokeless tobacco: Never  Substance Use Topics   Alcohol use: Yes    Alcohol/week: 5.0 standard drinks    Types: 5 Glasses of wine per week    Comment: OCC   Marital Status: Married   Review of Systems  Cardiovascular:  Negative for chest pain, dyspnea on exertion and leg swelling.  Gastrointestinal:  Positive for bloating and nausea.  Neurological:  Positive for dizziness (occasional).     Objective:  Blood pressure 129/87, pulse 83, temperature 98.4 F (  36.9 C), temperature source Temporal, resp. rate 17, height 5' 9"  (1.753 m), weight 194 lb (88 kg), SpO2 98 %. Body mass index is 28.65 kg/m.   Vitals with BMI 04/24/2021 11/29/2020 11/29/2020  Height 5' 9"  - -  Weight 194 lbs - -  BMI 62.22 - -  Systolic 979 892 119  Diastolic 87 77 81  Pulse 83 69 74   Physical Exam Vitals reviewed.  Constitutional:       Appearance: Normal appearance. She is well-developed.  Cardiovascular:     Rate and Rhythm: Normal rate and regular rhythm.     Pulses: Intact distal pulses.     Heart sounds: Normal heart sounds.  Pulmonary:     Effort: Pulmonary effort is normal. No accessory muscle usage or respiratory distress.     Breath sounds: Normal breath sounds.  Abdominal:     General: Bowel sounds are normal.     Palpations: Abdomen is soft.  Neurological:     Mental Status: She is oriented to person, place, and time.   Radiology: No results found.  Laboratory examination:    CMP Latest Ref Rng & Units 11/29/2020 07/28/2019 04/02/2019  Glucose 70 - 99 mg/dL 97 109(H) 83  BUN 6 - 20 mg/dL 12 5(L) 8  Creatinine 0.44 - 1.00 mg/dL 0.76 0.74 0.75  Sodium 135 - 145 mmol/L 138 140 138  Potassium 3.5 - 5.1 mmol/L 3.8 4.5 4.0  Chloride 98 - 111 mmol/L 103 104 106  CO2 22 - 32 mmol/L 25 22 25   Calcium 8.9 - 10.3 mg/dL 9.3 9.4 9.2  Total Protein 6.5 - 8.1 g/dL 7.7 - 7.8  Total Bilirubin 0.3 - 1.2 mg/dL 0.1(L) - <0.1(L)  Alkaline Phos 38 - 126 U/L 58 - 61  AST 15 - 41 U/L 15 - 18  ALT 0 - 44 U/L 14 - 17   CBC Latest Ref Rng & Units 11/29/2020 04/02/2019 08/25/2018  WBC 4.0 - 10.5 K/uL 6.1 8.0 6.9  Hemoglobin 12.0 - 15.0 g/dL 12.5 13.3 12.1  Hematocrit 36.0 - 46.0 % 37.3 40.5 37.8  Platelets 150 - 400 K/uL 241 256 302   Lipid Panel     Component Value Date/Time   CHOL 187 11/30/2013 0210   TRIG 197 (H) 08/25/2018 0439   HDL 49 11/30/2013 0210   CHOLHDL 3.8 11/30/2013 0210   VLDL 21 11/30/2013 0210   LDLCALC 117 (H) 11/30/2013 0210    PRN Meds:. Medications Discontinued During This Encounter  Medication Reason   metoprolol succinate (TOPROL-XL) 50 MG 24 hr tablet Duplicate   Current Meds  Medication Sig   acetaminophen (TYLENOL) 500 MG tablet Take 2 tablets (1,000 mg total) by mouth every 8 (eight) hours.   albuterol (VENTOLIN HFA) 108 (90 Base) MCG/ACT inhaler Inhale 1 puff into the lungs as needed.    AMBIEN CR 12.5 MG CR tablet Take 12.5 mg by mouth at bedtime.   ARIPiprazole (ABILIFY) 10 MG tablet Take 10 mg by mouth daily.   busPIRone (BUSPAR) 10 MG tablet Take 20 mg by mouth 3 (three) times daily.   clonazePAM (KLONOPIN) 1 MG tablet Take 1 tablet (1 mg total) by mouth 2 (two) times daily as needed for anxiety. (Patient taking differently: Take 1 mg by mouth daily as needed for anxiety.)   CYANOCOBALAMIN IJ Inject 1 mL as directed every 30 (thirty) days.   DEXILANT 30 MG capsule Take 1 capsule by mouth 2 (two) times daily.   diltiazem (CARDIZEM CD) 180  MG 24 hr capsule TAKE 1 CAPSULE DAILY.   EPINEPHrine 0.3 mg/0.3 mL IJ SOAJ injection See admin instructions.   haloperidol (HALDOL) 10 MG tablet Take 1 tablet (10 mg total) by mouth 2 (two) times daily as needed for up to 5 doses.   hydrOXYzine (ATARAX/VISTARIL) 10 MG tablet Take 10 mg by mouth at bedtime.    levalbuterol (XOPENEX HFA) 45 MCG/ACT inhaler Inhale into the lungs as needed.   metoprolol tartrate (LOPRESSOR) 50 MG tablet Take by mouth.   montelukast (SINGULAIR) 10 MG tablet Take 10 mg by mouth at bedtime.   ondansetron (ZOFRAN-ODT) 4 MG disintegrating tablet Take 1 tablet (4 mg total) by mouth every 6 (six) hours as needed for nausea or vomiting.   pantoprazole (PROTONIX) 40 MG tablet Take 1 tablet (40 mg total) by mouth 2 (two) times daily.   prochlorperazine (COMPAZINE) 5 MG tablet Take 1 tablet (5 mg total) by mouth every 6 (six) hours as needed for refractory nausea / vomiting (if not controlled with zofran).   promethazine (PHENERGAN) 25 MG tablet Take 25 mg by mouth every 6 (six) hours as needed.   scopolamine (TRANSDERM-SCOP) 1 MG/3DAYS as needed.   traZODone (DESYREL) 100 MG tablet Take 300 mg by mouth at bedtime.    [DISCONTINUED] metoprolol succinate (TOPROL-XL) 50 MG 24 hr tablet TAKE ONE TABLET TWICE A DAY WITH OR IMMEDIATELY FOLLOWING A MEAL.    Cardiac Studies:   EKG 04/24/2021: Normal sinus rhythm at rate of  71 bpm, otherwise normal EKG.  No significant change from 01/21/2020.  Assessment:   POTS (postural orthostatic tachycardia syndrome)  Palpitations - Plan: EKG 12-Lead  Recommendations:    Analeigha Nauman  is a 40 y.o. female  with hernitated ruptured cervical disc in 2015 and was placed on chronic steroid therapy, since then she developed Cushing's disease, significant weight gain. 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   She continues to have issues with nausea and vomiting but is now controlled with present medical regimen.  She has maintained weight loss  Symptoms of POTS has been well controlled on present medical regimen.  EKG revealed sinus rhythm.  No changes were done by me today, I will see her back on an annual basis. I congratulated her on having lost weight and following a disciplined diet.  She has now returned to work again. I will see her back annually.    Adrian Prows, MD, [facility] 04/27/2021, 9:06 PM Office: [phone]

## 2021-05-03 DIAGNOSIS — R202 Paresthesia of skin: Secondary | ICD-10-CM | POA: Diagnosis not present

## 2021-05-17 DIAGNOSIS — M47816 Spondylosis without myelopathy or radiculopathy, lumbar region: Secondary | ICD-10-CM | POA: Diagnosis not present

## 2021-05-26 ENCOUNTER — Other Ambulatory Visit: Payer: Self-pay | Admitting: Neurosurgery

## 2021-05-26 DIAGNOSIS — M47816 Spondylosis without myelopathy or radiculopathy, lumbar region: Secondary | ICD-10-CM

## 2021-05-28 ENCOUNTER — Emergency Department (HOSPITAL_COMMUNITY)
Admission: EM | Admit: 2021-05-28 | Discharge: 2021-05-29 | Disposition: A | Discharge status: Home / Self Care | Payer: BC Managed Care – PPO | Attending: Emergency Medicine | Admitting: Emergency Medicine

## 2021-05-28 ENCOUNTER — Other Ambulatory Visit: Payer: Self-pay

## 2021-05-28 DIAGNOSIS — M79604 Pain in right leg: Secondary | ICD-10-CM | POA: Diagnosis not present

## 2021-05-28 DIAGNOSIS — R32 Unspecified urinary incontinence: Secondary | ICD-10-CM | POA: Diagnosis not present

## 2021-05-28 DIAGNOSIS — M5442 Lumbago with sciatica, left side: Secondary | ICD-10-CM | POA: Diagnosis not present

## 2021-05-28 DIAGNOSIS — M5126 Other intervertebral disc displacement, lumbar region: Secondary | ICD-10-CM | POA: Diagnosis not present

## 2021-05-28 DIAGNOSIS — Q279 Congenital malformation of peripheral vascular system, unspecified: Secondary | ICD-10-CM | POA: Diagnosis not present

## 2021-05-28 DIAGNOSIS — M5125 Other intervertebral disc displacement, thoracolumbar region: Secondary | ICD-10-CM | POA: Diagnosis not present

## 2021-05-28 DIAGNOSIS — J45909 Unspecified asthma, uncomplicated: Secondary | ICD-10-CM | POA: Diagnosis not present

## 2021-05-28 DIAGNOSIS — M5127 Other intervertebral disc displacement, lumbosacral region: Secondary | ICD-10-CM | POA: Diagnosis not present

## 2021-05-29 ENCOUNTER — Encounter (HOSPITAL_COMMUNITY): Payer: Self-pay

## 2021-05-29 ENCOUNTER — Emergency Department (HOSPITAL_COMMUNITY): Payer: BC Managed Care – PPO

## 2021-05-29 DIAGNOSIS — R32 Unspecified urinary incontinence: Secondary | ICD-10-CM | POA: Diagnosis not present

## 2021-05-29 DIAGNOSIS — M79604 Pain in right leg: Secondary | ICD-10-CM | POA: Diagnosis not present

## 2021-05-29 DIAGNOSIS — M5125 Other intervertebral disc displacement, thoracolumbar region: Secondary | ICD-10-CM | POA: Diagnosis not present

## 2021-05-29 DIAGNOSIS — M5442 Lumbago with sciatica, left side: Secondary | ICD-10-CM | POA: Diagnosis not present

## 2021-05-29 DIAGNOSIS — M5127 Other intervertebral disc displacement, lumbosacral region: Secondary | ICD-10-CM | POA: Diagnosis not present

## 2021-05-29 DIAGNOSIS — J45909 Unspecified asthma, uncomplicated: Secondary | ICD-10-CM | POA: Diagnosis not present

## 2021-05-29 DIAGNOSIS — Q279 Congenital malformation of peripheral vascular system, unspecified: Secondary | ICD-10-CM | POA: Diagnosis not present

## 2021-05-29 DIAGNOSIS — M5126 Other intervertebral disc displacement, lumbar region: Secondary | ICD-10-CM | POA: Diagnosis not present

## 2021-05-29 LAB — URINALYSIS, ROUTINE W REFLEX MICROSCOPIC
Bilirubin Urine: NEGATIVE
Glucose, UA: NEGATIVE mg/dL
Hgb urine dipstick: NEGATIVE
Ketones, ur: NEGATIVE mg/dL
Leukocytes,Ua: NEGATIVE
Nitrite: NEGATIVE
Protein, ur: NEGATIVE mg/dL
Specific Gravity, Urine: 1.011 (ref 1.005–1.030)
pH: 5 (ref 5.0–8.0)

## 2021-05-29 MED ORDER — HYDROMORPHONE HCL 1 MG/ML IJ SOLN
0.5000 mg | Freq: Once | INTRAMUSCULAR | Status: AC
  Administered 2021-05-29: 0.5 mg via INTRAVENOUS
  Filled 2021-05-29: qty 1

## 2021-05-29 MED ORDER — HYDROMORPHONE HCL 1 MG/ML IJ SOLN
0.5000 mg | INTRAMUSCULAR | Status: DC | PRN
  Administered 2021-05-29 (×2): 0.5 mg via INTRAVENOUS
  Filled 2021-05-29 (×2): qty 1

## 2021-05-29 MED ORDER — OXYCODONE-ACETAMINOPHEN 5-325 MG PO TABS: 1.0000 | ORAL_TABLET | Freq: Four times a day (QID) | ORAL | 0 refills | Status: DC | PRN

## 2021-05-29 MED ORDER — HYDROMORPHONE HCL 1 MG/ML IJ SOLN
1.0000 mg | Freq: Once | INTRAMUSCULAR | Status: AC
  Administered 2021-05-29: 1 mg via INTRAVENOUS
  Filled 2021-05-29: qty 1

## 2021-05-29 MED ORDER — GABAPENTIN 100 MG PO CAPS: 100.0000 mg | ORAL_CAPSULE | Freq: Two times a day (BID) | ORAL | 0 refills | Status: DC

## 2021-05-29 MED ORDER — LORAZEPAM 2 MG/ML IJ SOLN
1.0000 mg | Freq: Once | INTRAMUSCULAR | Status: AC
  Administered 2021-05-29: 1 mg via INTRAVENOUS
  Filled 2021-05-29: qty 1

## 2021-05-29 NOTE — ED Provider Notes (Signed)
Accepted handoff at shift change from S. [place] PA-C. Please see prior provider note for more detail.   Briefly: Patient is 40 y.o.  Followed by Dr. Kathyrn Sheriff of neurosurgery.  Here for severe low back pain with some urinary symptoms concerning for cauda equina no other symptoms of cauda equina specifically no difficulty walking numbness saddle anesthesia or loss of reflexes.  DDX: concern for cauda equina   Plan: Follow-up on lumbar MRI.   Physical Exam  BP 115/81   Pulse (!) 104   Temp 98.1 F (36.7 C) (Oral)   Resp 18   Ht 5' 9"  (1.753 m)   Wt 86.2 kg   SpO2 97%   BMI 28.06 kg/m   Physical Exam Vitals and nursing note reviewed.  Constitutional:      Comments: Uncomfortable appearing 40 year old female.  Speaking full sentences.  Able answer questions appropriately follow commands.  HENT:     Head: Normocephalic and atraumatic.     Nose: Nose normal.     Mouth/Throat:     Mouth: Mucous membranes are moist.  Eyes:     General: No scleral icterus. Cardiovascular:     Rate and Rhythm: Regular rhythm. Tachycardia present.     Pulses: Normal pulses.     Heart sounds: Normal heart sounds.     Comments: Bilateral radial artery and DP pulses are 3+ and symmetric. Pulmonary:     Effort: Pulmonary effort is normal. No respiratory distress.     Breath sounds: No wheezing.  Abdominal:     Palpations: Abdomen is soft.     Tenderness: There is no abdominal tenderness. There is no right CVA tenderness, left CVA tenderness, guarding or rebound.     Comments: Abdomen is soft, nontender.  No guarding or rebound.  Musculoskeletal:     Cervical back: Normal range of motion.     Right lower leg: No edema.     Left lower leg: No edema.     Comments: Midline lumbar tenderness to palpation.  Skin:    General: Skin is warm and dry.     Capillary Refill: Capillary refill takes less than 2 seconds.  Neurological:     Mental Status: She is alert. Mental status is at baseline.   Psychiatric:        Mood and Affect: Mood normal.        Behavior: Behavior normal.    ED Course/Procedures     Procedures  MDM  Patient notably has had a complete abdominal hysterectomy in 2011 We will check a urinalysis given patient is having some urinary incontinence however she is not having any other urinary symptoms indicative of UTI/pyelonephritis.  She is afebrile.  Patient does have some tachycardia although this is multifactorial most likely given patient has a history of POTS and is also experiencing again discomfort.  History of iatrogenic Cushing's syndrome. Will attempt to control pain with a brief course of narcotic pain medication as well as Tylenol/ibuprofen/gabapentin.  She will follow closely with Dr. Kathyrn Sheriff -- message sent to promote continuity of care/awareness that pt was seen in ER.   Patient extensively counseled on medications including the correct dose of Tylenol she is able to teach [place] back.  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  Strict return precautions were given along with instructions to follow closely if her neurosurgeon.  Urinalysis is unremarkable.  Patient symptoms have dramatically improved with narcotic pain medication here.  Tachycardia has almost  completely resolved.  Patient remains afebrile with temperature recheck.  Vital signs otherwise unremarkable.  MRI reviewed.  No evidence of cauda equina.  Printed and provided patient with copy of MRI read.      Pati Gallo [place], [place] 05/29/21 [id number]    Charlesetta Shanks, MD 05/30/21 1115

## 2021-05-29 NOTE — ED Triage Notes (Signed)
Pt states that she has been having lower back pain that shoots down her right leg  since July 17th and is now having bladder incontinence as of 1 day ago. Pt is scheduled to have an MRI on Tuesday with [facility].

## 2021-05-29 NOTE — ED Notes (Signed)
Called Insurance risk surveyor at [employer] and gave report

## 2021-05-29 NOTE — ED Provider Notes (Signed)
Patient transferred from [facility] ED for MRI evaluation of back pain and urinary incontinence. MRI pending. On arrival, she reports pain is returning. Will re-dose pain medication for comfort. VSS. Mildly tachycardic.   Today's Vitals   05/29/21 0200 05/29/21 0210 05/29/21 0238 05/29/21 0245  BP: 125/80  (!) 140/104   Pulse: 96  (!) 112   Resp: 17  18   Temp:  97.8 F (36.6 C) 98.1 F (36.7 C)   TempSrc:  Oral Oral   SpO2: 100%  100%   Weight:      Height:      PainSc:    5    Body mass index is 28.06 kg/m.  Pain addressed throughout ED encounter. I have placed an order for 0.5 mg Dilaudid q 2 hours prn until MRI can be obtained.   Patient care signed out to oncoming provider team to continue care.   Charlann Lange, PA-C 05/29/21 0554    Orpah Greek, MD 05/29/21 [phone]

## 2021-05-29 NOTE — Discharge Instructions (Addendum)
Your urinalysis was without any evidence of infection.  I prescribed you a short course of narcotic pain medicine to use only as needed for breakthrough pain.  I would like for you to start taking gabapentin as prescribed twice daily.  This can cause some drowsiness--do not operate any machinery or take this medication at times when you will need to be alert and have quick reflexes.  Please use Tylenol or ibuprofen for pain.  You may use 600 mg ibuprofen every 6 hours or 1000 mg of Tylenol every 6 hours.  You may choose to alternate between the 2.  This would be most effective.  Not to exceed 4 g of Tylenol within 24 hours.  Not to exceed 3200 mg ibuprofen 24 hours.  The narcotic pain medicine I prescribed you is called percocet (it does contain tylenol -- if you use this please decrease the dose of tylenol you are taking to 558m for one dose or skip one dose of tylenol).

## 2021-05-29 NOTE — ED Notes (Signed)
Returned from MRI 

## 2021-05-29 NOTE — ED Provider Notes (Addendum)
[place] DEPT Provider Note   CSN: [id number] Arrival date & time: 05/28/21  2348     History Chief Complaint  Patient presents with   Back Pain   Urinary Incontinence    Kaitlyn Johnson is a 40 y.o. female.  The history is provided by the patient and medical records.  Back Pain  40 year old female with history of anemia, anxiety, Cushing's syndrome, POTS, Bertolotti syndrome, presenting to the ED with low back pain.  States this began a few days ago and was seen by nurse practitioner at [place] [facility].  Pain has been in mid lower back with radiation down the right leg.  She denies any focal numbness or weakness.  She remains ambulatory.  She was scheduled for MRI on Tuesday, 05/30/2021 but symptoms have worsened.  Starting this morning she darted having some urinary incontinence.  States she never had the urge to urinate, but would feel urine coming out and then would get up and go to the bathroom and finish urinating.  States this is very unusual for her.  She has not had any fecal incontinence.  She denies any dysuria or lower abdominal discomfort.  She denies any fever.  No history of IV drug use or cancer.  She did talk with physician on-call at [facility] neurosurgery who sent her to the ED.  Has had prior lumbar laminectomy.  Usually follows with Dr. Kathyrn Sheriff.  Past Medical History:  Diagnosis Date   Acute appendicitis 02/28/2017   Acute pancreatitis 11/30/2013   Anemia 11/26/2011   Anxiety    Asthma    Celiac and mesenteric artery injury    Cushing's syndrome ([place])    06/21/16- "in remission"   Depression    H/O hiatal hernia    History of kidney stones    Iatrogenic Cushing's syndrome ([place]) 10/02/2013   Nausea vomiting and diarrhea 11/23/2013   Nausea with vomiting 11/30/2013   Palpitations 12/05/2018   Pancreatitis 11/29/2013   POTS (postural orthostatic tachycardia syndrome)    Shortness of breath dyspnea    with exertertion    Sphincter of Oddi dysfunction    Von Willebrand disease ([place])    bruising easy    Patient Active Problem List   Diagnosis Date Noted   Palpitations 12/05/2018   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 07/15/2018   HNP (herniated nucleus pulposus), lumbar 06/22/2016   Hypomagnesemia 11/25/2013   Anemia of chronic disease 11/23/2013   Endometriosis 09/01/2013   Celiac disease 09/01/2013   Asthma, chronic 09/01/2013   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   Abnormal LFTs 07/15/2012   POTS (postural orthostatic tachycardia syndrome) 11/26/2011    Past Surgical History:  Procedure Laterality Date   ABDOMINAL HYSTERECTOMY  08/2010   ANTERIOR CERVICAL DECOMP/DISCECTOMY FUSION N/A 03/31/2013   Procedure: ANTERIOR CERVICAL DECOMPRESSION/DISCECTOMY FUSION 1 LEVEL Cervical five-six;  Surgeon: Faythe Ghee, MD;  Location: [place] [facility];  Service: Neurosurgery;  Laterality: N/A;   APPENDECTOMY     Cholangio-Pancreatography with Spintectorotomy + Stent  07/16/2012   01/15/14   CHOLECYSTECTOMY     ESOPHAGOGASTRODUODENOSCOPY (EGD) WITH PROPOFOL N/A 08/19/2018   Procedure: ESOPHAGOGASTRODUODENOSCOPY (EGD) WITH PROPOFOL;  Surgeon: Wilford Corner, MD;  [facility];  Service: Endoscopy;  Laterality: N/A;   LAPAROSCOPIC APPENDECTOMY N/A 02/28/2017   Procedure: APPENDECTOMY LAPAROSCOPIC;  Surgeon: Erroll Luna, MD;  Location: [facility];  Service: General;  Laterality: N/A;   LAPAROSCOPIC ENDOMETRIOSIS FULGURATION     LAPAROSCOPIC GASTRIC SLEEVE RESECTION N/A 07/15/2018   Procedure:  LAPAROSCOPIC GASTRIC SLEEVE RESECTION, UPPER ENDO, ERAS Pathway;  Surgeon: Johnathan Hausen, MD;  Location: [facility];  Service: General;  Laterality: N/A;   LUMBAR LAMINECTOMY     LUMBAR LAMINECTOMY/DECOMPRESSION MICRODISCECTOMY Left 06/22/2016   Procedure: MICRODISCECTOMY LEFT LUMBAR FOUR-FIVE;  Surgeon: Consuella Lose, MD;  Location: [place] NEURO ORS;  Service: Neurosurgery;  Laterality: Left;    ROUX-EN-Y PROCEDURE  04/1999     OB History   No obstetric history on file.     Family History  Problem Relation Age of Onset   Hypertension Mother     Social History   Tobacco Use   Smoking status: Never   Smokeless tobacco: Never  Vaping Use   Vaping Use: Never used  Substance Use Topics   Alcohol use: Yes    Alcohol/week: 5.0 standard drinks    Types: 5 Glasses of wine per week    Comment: OCC   Drug use: No    Home Medications Prior to Admission medications   Medication Sig Start Date End Date Taking? Authorizing Provider  acetaminophen (TYLENOL) 500 MG tablet Take 2 tablets (1,000 mg total) by mouth every 8 (eight) hours. 08/25/18   Meuth, Brooke A, PA-C  albuterol (VENTOLIN HFA) 108 (90 Base) MCG/ACT inhaler Inhale 1 puff into the lungs as needed. 01/20/15   [provider]  AMBIEN CR 12.5 MG CR tablet Take 12.5 mg by mouth at bedtime. 10/31/15   [provider]  ARIPiprazole (ABILIFY) 10 MG tablet Take 10 mg by mouth daily.    [provider]  busPIRone (BUSPAR) 10 MG tablet Take 20 mg by mouth 3 (three) times daily. 02/20/21   [provider]  clonazePAM (KLONOPIN) 1 MG tablet Take 1 tablet (1 mg total) by mouth 2 (two) times daily as needed for anxiety. Patient taking differently: Take 1 mg by mouth daily as needed for anxiety. 12/07/13   Theodis Blaze, MD  CYANOCOBALAMIN IJ Inject 1 mL as directed every 30 (thirty) days.    [provider]  DEXILANT 30 MG capsule Take 1 capsule by mouth 2 (two) times daily. 03/29/21   [provider]  diltiazem (CARDIZEM CD) 180 MG 24 hr capsule TAKE 1 CAPSULE DAILY. 06/27/20   Adrian Prows, MD  EPINEPHrine 0.3 mg/0.3 mL IJ SOAJ injection See admin instructions. 07/07/13   [provider]  haloperidol (HALDOL) 10 MG tablet Take 1 tablet (10 mg total) by mouth 2 (two) times daily as needed for up to 5 doses. 11/29/20   Wyvonnia Dusky, MD  hydrOXYzine (ATARAX/VISTARIL) 10 MG tablet  Take 10 mg by mouth at bedtime.     [provider]  levalbuterol Penne Lash HFA) 45 MCG/ACT inhaler Inhale into the lungs as needed.    [provider]  metoprolol tartrate (LOPRESSOR) 50 MG tablet Take by mouth.    [provider]  montelukast (SINGULAIR) 10 MG tablet Take 10 mg by mouth at bedtime.    [provider]  ondansetron (ZOFRAN-ODT) 4 MG disintegrating tablet Take 1 tablet (4 mg total) by mouth every 6 (six) hours as needed for nausea or vomiting. 08/25/18   Meuth, Brooke A, PA-C  pantoprazole (PROTONIX) 40 MG tablet Take 1 tablet (40 mg total) by mouth 2 (two) times daily. 08/25/18   Meuth, Brooke A, PA-C  prochlorperazine (COMPAZINE) 5 MG tablet Take 1 tablet (5 mg total) by mouth every 6 (six) hours as needed for refractory nausea / vomiting (if not controlled with zofran). 08/25/18  Meuth, Brooke A, PA-C  promethazine (PHENERGAN) 25 MG tablet Take 25 mg by mouth every 6 (six) hours as needed. 11/28/20   [provider]  scopolamine (TRANSDERM-SCOP) 1 MG/3DAYS as needed.    [provider]  traZODone (DESYREL) 100 MG tablet Take 300 mg by mouth at bedtime.     [provider]    Allergies    Bee venom, Reglan [metoclopramide], Shellfish allergy, Wheat bran, Adhesive [tape], Gluten meal, Sulfa antibiotics, Tartrazine, and Yellow dye  Review of Systems   Review of Systems  Musculoskeletal:  Positive for back pain.  All other systems reviewed and are negative.  Physical Exam Updated Vital Signs BP 129/90   Pulse 96   Temp 98 F (36.7 C) (Oral)   Resp 16   Ht 5' 9"  (1.753 m)   Wt 86.2 kg   SpO2 100%   BMI 28.06 kg/m   Physical Exam Vitals and nursing note reviewed.  Constitutional:      Appearance: She is well-developed.  HENT:     Head: Normocephalic and atraumatic.  Eyes:     Conjunctiva/sclera: Conjunctivae normal.     Pupils: Pupils are equal, round, and reactive to light.  Cardiovascular:     Rate  and Rhythm: Normal rate and regular rhythm.     Heart sounds: Normal heart sounds.  Pulmonary:     Effort: Pulmonary effort is normal.     Breath sounds: Normal breath sounds.  Abdominal:     General: Bowel sounds are normal.     Palpations: Abdomen is soft.     Comments: Soft, nontender, no fullness over the bladder  Genitourinary:    Comments: Exam chaperoned by NT Normal rectal tone Musculoskeletal:        General: Normal range of motion.     Cervical back: Normal range of motion.     Comments: Normal strength and sensation of both legs, ambulatory without issue  Skin:    General: Skin is warm and dry.  Neurological:     Mental Status: She is alert and oriented to person, place, and time.    ED Results / Procedures / Treatments   Labs (all labs ordered are listed, but only abnormal results are displayed) Labs Reviewed - No data to display  EKG None  Radiology No results found.  Procedures Procedures   Medications Ordered in ED Medications  HYDROmorphone (DILAUDID) injection 1 mg (1 mg Intravenous Given 05/29/21 0125)    ED Course  I have reviewed the triage vital signs and the nursing notes.  Pertinent labs & imaging results that were available during my care of the patient were reviewed by me and considered in my medical decision making (see chart for details).    MDM Rules/Calculators/A&P                           40 year old female presenting to the ED with worsening low back pain and new urinary incontinence.  Has history of Bertolotti syndrome and has had prior lumbar laminectomy in the past.  She was seen in [place] neuro[facility] office 3 days ago due to pain and scheduled for MRI 05/30/21 but worsening pain and incontinence today prompted ED visit.  Urinary incontinence is described as start of urinary stream without urge to urinate, but able to get up and finish urinating in bathroom.  Never had this happen before.  She did speak with physician on-call at  [facility] neurosurgery who sent  to the ED.    She is afebrile and nontoxic in appearance here.  She was able to ambulate from triage to treatment room without apparent difficulty.  She has no strength or sensory deficit.  Does have some radicular type symptoms into the right leg but normal pulses bilaterally.  Rectal exam was performed, good tone without any reported fecal incontinence.  Given her symptoms, I do feel she requires MRI to rule out cauda equina.  As MRI not available at this facility, will transfer to [facility] to have this done tonight.  Discussed with Dr. Leonette Monarch at [facility], accepts patient in transfer.  Charge RN has been notified as well.  Patient is aware of plan and agreeable.  Final Clinical Impression(s) / ED Diagnoses Final diagnoses:  Acute midline low back pain with left-sided sciatica  Urinary incontinence, unspecified type    Rx / DC Orders ED Discharge Orders     None        Larene Pickett, PA-C 05/29/21 0224    Larene Pickett, PA-C 05/29/21 0226    Quintella Reichert, MD 05/29/21 [phone]

## 2021-05-29 NOTE — ED Notes (Signed)
Pt ambulatory from triage

## 2021-05-29 NOTE — ED Notes (Signed)
Patient transported to MRI 

## 2021-05-29 NOTE — ED Notes (Signed)
Patient here from [facility].  NAD.  VSS.  CAOX4.  Pain 3/10.

## 2021-05-30 ENCOUNTER — Inpatient Hospital Stay: Admission: RE | Admit: 2021-05-30 | Payer: BC Managed Care – PPO | Source: Ambulatory Visit

## 2021-06-05 DIAGNOSIS — Z6828 Body mass index (BMI) 28.0-28.9, adult: Secondary | ICD-10-CM | POA: Diagnosis not present

## 2021-06-05 DIAGNOSIS — R030 Elevated blood-pressure reading, without diagnosis of hypertension: Secondary | ICD-10-CM | POA: Diagnosis not present

## 2021-06-05 DIAGNOSIS — M5440 Lumbago with sciatica, unspecified side: Secondary | ICD-10-CM | POA: Diagnosis not present

## 2021-06-07 ENCOUNTER — Inpatient Hospital Stay: Admission: RE | Admit: 2021-06-07 | Payer: BC Managed Care – PPO | Source: Ambulatory Visit

## 2021-06-17 ENCOUNTER — Other Ambulatory Visit: Payer: Self-pay | Admitting: Cardiology

## 2021-06-25 ENCOUNTER — Other Ambulatory Visit: Payer: Self-pay | Admitting: Cardiology

## 2021-06-26 ENCOUNTER — Other Ambulatory Visit: Payer: Self-pay

## 2021-08-08 DIAGNOSIS — F411 Generalized anxiety disorder: Secondary | ICD-10-CM | POA: Diagnosis not present

## 2021-08-08 DIAGNOSIS — F605 Obsessive-compulsive personality disorder: Secondary | ICD-10-CM | POA: Diagnosis not present

## 2021-08-08 DIAGNOSIS — F331 Major depressive disorder, recurrent, moderate: Secondary | ICD-10-CM | POA: Diagnosis not present

## 2021-08-10 DIAGNOSIS — R1110 Vomiting, unspecified: Secondary | ICD-10-CM | POA: Diagnosis not present

## 2021-08-10 DIAGNOSIS — R110 Nausea: Secondary | ICD-10-CM | POA: Diagnosis not present

## 2021-08-10 DIAGNOSIS — K3184 Gastroparesis: Secondary | ICD-10-CM | POA: Diagnosis not present

## 2021-08-22 ENCOUNTER — Other Ambulatory Visit (HOSPITAL_BASED_OUTPATIENT_CLINIC_OR_DEPARTMENT_OTHER): Payer: Self-pay

## 2021-08-22 MED ORDER — INFLUENZA VAC SPLIT QUAD 0.5 ML IM SUSY
PREFILLED_SYRINGE | INTRAMUSCULAR | 0 refills | Status: DC
  Filled 2021-08-22: qty 0.5, 1d supply, fill #0

## 2021-09-04 ENCOUNTER — Other Ambulatory Visit: Payer: Self-pay | Admitting: Family Medicine

## 2021-09-04 ENCOUNTER — Other Ambulatory Visit: Payer: Self-pay

## 2021-09-04 ENCOUNTER — Ambulatory Visit
Admission: RE | Admit: 2021-09-04 | Discharge: 2021-09-04 | Disposition: A | Discharge status: Home / Self Care | Payer: BC Managed Care – PPO | Source: Ambulatory Visit | Attending: Family Medicine | Admitting: Family Medicine

## 2021-09-04 DIAGNOSIS — G4485 Primary stabbing headache: Secondary | ICD-10-CM | POA: Diagnosis not present

## 2021-09-04 DIAGNOSIS — R42 Dizziness and giddiness: Secondary | ICD-10-CM | POA: Diagnosis not present

## 2021-09-04 DIAGNOSIS — Z1329 Encounter for screening for other suspected endocrine disorder: Secondary | ICD-10-CM | POA: Diagnosis not present

## 2021-09-04 DIAGNOSIS — E519 Thiamine deficiency, unspecified: Secondary | ICD-10-CM | POA: Diagnosis not present

## 2021-09-04 DIAGNOSIS — Z131 Encounter for screening for diabetes mellitus: Secondary | ICD-10-CM | POA: Diagnosis not present

## 2021-09-04 DIAGNOSIS — R2681 Unsteadiness on feet: Secondary | ICD-10-CM | POA: Diagnosis not present

## 2021-09-04 DIAGNOSIS — E789 Disorder of lipoprotein metabolism, unspecified: Secondary | ICD-10-CM | POA: Diagnosis not present

## 2021-09-13 ENCOUNTER — Encounter: Payer: Self-pay | Admitting: Student

## 2021-09-13 ENCOUNTER — Other Ambulatory Visit: Payer: Self-pay

## 2021-09-13 ENCOUNTER — Ambulatory Visit: Payer: BC Managed Care – PPO | Admitting: Student

## 2021-09-13 VITALS — Temp 98.4°F | Ht 69.0 in | Wt 198.0 lb

## 2021-09-13 DIAGNOSIS — R072 Precordial pain: Secondary | ICD-10-CM | POA: Diagnosis not present

## 2021-09-13 DIAGNOSIS — G90A Postural orthostatic tachycardia syndrome (POTS): Secondary | ICD-10-CM

## 2021-09-13 MED ORDER — ACEBUTOLOL HCL 200 MG PO CAPS: 200.0000 mg | ORAL_CAPSULE | Freq: Two times a day (BID) | ORAL | 3 refills | Status: DC

## 2021-09-13 NOTE — Progress Notes (Signed)
Primary Physician:  Maude Leriche, PA-C   Patient ID: Kaitlyn Johnson, female    DOB: 11/09/1980, 40 y.o.   MRN: [id number]  Subjective:    Chief Complaint  Patient presents with   POTS (postural orthostatic tachycardia syndrome)   Chest Pain    HPI: Kaitlyn Johnson  is a 40 y.o. female  with hernitated ruptured cervical disc in 2015 and was placed on chronic steroid therapy, since then she developed Cushing's disease, significant weight gain. 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   Patient has a diagnosis of POTS, celiac disease, superior mesenteric artery syndrome.  Patient presents for urgent visit today at her request with concerns of shortness of breath, dizziness, headache, tachycardia.  Patient's symptoms have previously been well controlled on metoprolol and diltiazem for POTS, however over the last 1 week she has been having frequent episodes of orthostatic intolerance as well as chest pain associated with these episodes.  She continues to drink approximately 2.5 L of water per day, she does not wear compression socks.  Patient's symptoms resolve when she lays flat.   Past Medical History:  Diagnosis Date   Acute appendicitis 02/28/2017   Acute pancreatitis 11/30/2013   Anemia 11/26/2011   Anxiety    Asthma    Celiac and mesenteric artery injury    Cushing's syndrome ([place])    06/21/16- "in remission"   Depression    H/O hiatal hernia    History of kidney stones    Iatrogenic Cushing's syndrome ([place]) 10/02/2013   Nausea vomiting and diarrhea 11/23/2013   Nausea with vomiting 11/30/2013   Palpitations 12/05/2018   Pancreatitis 11/29/2013   POTS (postural orthostatic tachycardia syndrome)    Shortness of breath dyspnea    with exertertion   Sphincter of Oddi dysfunction    Von Willebrand disease    bruising easy   Past Surgical History:  Procedure Laterality Date   ABDOMINAL HYSTERECTOMY  08/2010   ANTERIOR CERVICAL DECOMP/DISCECTOMY FUSION  N/A 03/31/2013   Procedure: ANTERIOR CERVICAL DECOMPRESSION/DISCECTOMY FUSION 1 LEVEL Cervical five-six;  Surgeon: Faythe Ghee, MD;  Location: [place] NEURO ORS;  Service: Neurosurgery;  Laterality: N/A;   APPENDECTOMY     Cholangio-Pancreatography with Spintectorotomy + Stent  07/16/2012   01/15/14   CHOLECYSTECTOMY     ESOPHAGOGASTRODUODENOSCOPY (EGD) WITH PROPOFOL N/A 08/19/2018   Procedure: ESOPHAGOGASTRODUODENOSCOPY (EGD) WITH PROPOFOL;  Surgeon: Wilford Corner, MD;  [facility];  Service: Endoscopy;  Laterality: N/A;   LAPAROSCOPIC APPENDECTOMY N/A 02/28/2017   Procedure: APPENDECTOMY LAPAROSCOPIC;  Surgeon: Erroll Luna, MD;  [facility];  Service: General;  Laterality: N/A;   LAPAROSCOPIC ENDOMETRIOSIS FULGURATION     LAPAROSCOPIC GASTRIC SLEEVE RESECTION N/A 07/15/2018   Procedure: LAPAROSCOPIC GASTRIC SLEEVE RESECTION, UPPER ENDO, ERAS Pathway;  Surgeon: Johnathan Hausen, MD;  [facility];  Service: General;  Laterality: N/A;   LUMBAR LAMINECTOMY     LUMBAR LAMINECTOMY/DECOMPRESSION MICRODISCECTOMY Left 06/22/2016   Procedure: MICRODISCECTOMY LEFT LUMBAR FOUR-FIVE;  Surgeon: Consuella Lose, MD;  Location: [facility];  Service: Neurosurgery;  Laterality: Left;   ROUX-EN-Y PROCEDURE  04/1999   Family History  Problem Relation Age of Onset   Hypertension Mother    Social History   Tobacco Use   Smoking status: Never   Smokeless tobacco: Never  Substance Use Topics   Alcohol use: Yes    Alcohol/week: 5.0 standard drinks    Types: 5 Glasses of wine per week    Comment: OCC  Marital Status: Married   ROS   Review of Systems  Cardiovascular:  Positive for chest pain and palpitations. Negative for dyspnea on exertion and leg swelling.  Gastrointestinal:  Positive for bloating and nausea.  Neurological:  Positive for dizziness and headaches.     Objective:  Temperature 98.4 F (36.9 C), temperature source Temporal, height 5' 9"  (1.753 m), weight 198  lb (89.8 kg), SpO2 99 %. Body mass index is 29.24 kg/m.   Vitals with BMI 09/13/2021 05/29/2021 05/29/2021  Height 5' 9"  - -  Weight 198 lbs - -  BMI 65.99 - -  Systolic - 357 017  Diastolic - 81 81  Pulse - 105 104  Orthostatic VS for the past 72 hrs (Last 3 readings):  Orthostatic BP Patient Position BP Location Cuff Size Orthostatic Pulse  09/13/21 1004 (!) 124/94 Standing Left Arm Large 112  09/13/21 1003 132/89 Sitting Left Arm Large 103  09/13/21 0956 121/82 Supine Left Arm Large 90    Physical Exam Vitals reviewed.  Constitutional:      Appearance: Normal appearance. She is well-developed.  Cardiovascular:     Rate and Rhythm: Normal rate and regular rhythm.     Pulses: Intact distal pulses.     Heart sounds: Normal heart sounds, S1 normal and S2 normal. No murmur heard.   No gallop.  Pulmonary:     Effort: Pulmonary effort is normal. No accessory muscle usage or respiratory distress.     Breath sounds: Normal breath sounds. No wheezing, rhonchi or rales.  Musculoskeletal:     Right lower leg: No edema.     Left lower leg: No edema.  Neurological:     Mental Status: She is alert and oriented to person, place, and time.   Laboratory examination:    CMP Latest Ref Rng & Units 11/29/2020 07/28/2019 04/02/2019  Glucose 70 - 99 mg/dL 97 109(H) 83  BUN 6 - 20 mg/dL 12 5(L) 8  Creatinine 0.44 - 1.00 mg/dL 0.76 0.74 0.75  Sodium 135 - 145 mmol/L 138 140 138  Potassium 3.5 - 5.1 mmol/L 3.8 4.5 4.0  Chloride 98 - 111 mmol/L 103 104 106  CO2 22 - 32 mmol/L 25 22 25   Calcium 8.9 - 10.3 mg/dL 9.3 9.4 9.2  Total Protein 6.5 - 8.1 g/dL 7.7 - 7.8  Total Bilirubin 0.3 - 1.2 mg/dL 0.1(L) - <0.1(L)  Alkaline Phos 38 - 126 U/L 58 - 61  AST 15 - 41 U/L 15 - 18  ALT 0 - 44 U/L 14 - 17   CBC Latest Ref Rng & Units 11/29/2020 04/02/2019 08/25/2018  WBC 4.0 - 10.5 K/uL 6.1 8.0 6.9  Hemoglobin 12.0 - 15.0 g/dL 12.5 13.3 12.1  Hematocrit 36.0 - 46.0 % 37.3 40.5 37.8  Platelets 150 - 400 K/uL  241 256 302   Lipid Panel     Component Value Date/Time   CHOL 187 11/30/2013 0210   TRIG 197 (H) 08/25/2018 0439   HDL 49 11/30/2013 0210   CHOLHDL 3.8 11/30/2013 0210   VLDL 21 11/30/2013 0210   LDLCALC 117 (H) 11/30/2013 0210   External Labs:  None   Allergies   Allergies  Allergen Reactions   Bee Venom Anaphylaxis   Reglan [Metoclopramide] Other (See Comments)    Reaction:  Oculogyric crisis    Shellfish Allergy Anaphylaxis   Wheat Bran Other (See Comments)    Pt has celiac disease.     Adhesive [Tape] Hives   Gluten Meal Other (  See Comments)    Pt has celiac disease.    Sulfa Antibiotics Rash   Tartrazine Rash   Yellow Dye Rash    Medications Prior to Visit:   Outpatient Medications Prior to Visit  Medication Sig Dispense Refill   ABILIFY MAINTENA 400 MG SRER injection Inject 400 mg into the muscle every 28 (twenty-eight) days.     acetaminophen (TYLENOL) 500 MG tablet Take 2 tablets (1,000 mg total) by mouth every 8 (eight) hours. 30 tablet 0   albuterol (VENTOLIN HFA) 108 (90 Base) MCG/ACT inhaler Inhale 1 puff into the lungs as needed.     AMBIEN CR 12.5 MG CR tablet Take 12.5 mg by mouth at bedtime.  1   busPIRone (BUSPAR) 10 MG tablet Take 20 mg by mouth 3 (three) times daily.     clonazePAM (KLONOPIN) 1 MG tablet Take 1 tablet (1 mg total) by mouth 2 (two) times daily as needed for anxiety. (Patient taking differently: Take 1 mg by mouth daily as needed for anxiety.) 30 tablet 0   CYANOCOBALAMIN IJ Inject 1 mL as directed every 30 (thirty) days.     DEXILANT 30 MG capsule Take 1 capsule by mouth 2 (two) times daily.     diltiazem (CARDIZEM CD) 180 MG 24 hr capsule TAKE 1 CAPSULE DAILY. 90 capsule 3   EPINEPHrine 0.3 mg/0.3 mL IJ SOAJ injection See admin instructions.     levalbuterol (XOPENEX HFA) 45 MCG/ACT inhaler Inhale into the lungs as needed.     montelukast (SINGULAIR) 10 MG tablet Take 10 mg by mouth at bedtime.     ondansetron (ZOFRAN-ODT) 4 MG  disintegrating tablet Take 1 tablet (4 mg total) by mouth every 6 (six) hours as needed for nausea or vomiting. 20 tablet 0   pantoprazole (PROTONIX) 40 MG tablet Take 1 tablet (40 mg total) by mouth 2 (two) times daily. 30 tablet 1   prochlorperazine (COMPAZINE) 5 MG tablet Take 1 tablet (5 mg total) by mouth every 6 (six) hours as needed for refractory nausea / vomiting (if not controlled with zofran). 20 tablet 0   promethazine (PHENERGAN) 25 MG tablet Take 25 mg by mouth every 6 (six) hours as needed.     scopolamine (TRANSDERM-SCOP) 1 MG/3DAYS as needed.     traZODone (DESYREL) 100 MG tablet Take 300 mg by mouth at bedtime.      metoprolol succinate (TOPROL-XL) 50 MG 24 hr tablet TAKE ONE TABLET TWICE A DAY WITH OR IMMEDIATELY FOLLOWING A MEAL. 180 tablet 3   oxyCODONE-acetaminophen (PERCOCET/ROXICET) 5-325 MG tablet Take 1 tablet by mouth every 6 (six) hours as needed for severe pain. 10 tablet 0   influenza vac split quadrivalent PF (FLUARIX) 0.5 ML injection Inject into the muscle. 0.5 mL 0   gabapentin (NEURONTIN) 100 MG capsule Take 1 capsule (100 mg total) by mouth 2 (two) times daily for 7 days. 14 capsule 0   hydrOXYzine (ATARAX/VISTARIL) 10 MG tablet Take 10 mg by mouth at bedtime.   2   No facility-administered medications prior to visit.   Final Medications at End of Visit    Current Meds  Medication Sig   ABILIFY MAINTENA 400 MG SRER injection Inject 400 mg into the muscle every 28 (twenty-eight) days.   acebutolol (SECTRAL) 200 MG capsule Take 1 capsule (200 mg total) by mouth 2 (two) times daily.   acetaminophen (TYLENOL) 500 MG tablet Take 2 tablets (1,000 mg total) by mouth every 8 (eight) hours.   albuterol (VENTOLIN  HFA) 108 (90 Base) MCG/ACT inhaler Inhale 1 puff into the lungs as needed.   AMBIEN CR 12.5 MG CR tablet Take 12.5 mg by mouth at bedtime.   busPIRone (BUSPAR) 10 MG tablet Take 20 mg by mouth 3 (three) times daily.   clonazePAM (KLONOPIN) 1 MG tablet Take 1  tablet (1 mg total) by mouth 2 (two) times daily as needed for anxiety. (Patient taking differently: Take 1 mg by mouth daily as needed for anxiety.)   CYANOCOBALAMIN IJ Inject 1 mL as directed every 30 (thirty) days.   DEXILANT 30 MG capsule Take 1 capsule by mouth 2 (two) times daily.   diltiazem (CARDIZEM CD) 180 MG 24 hr capsule TAKE 1 CAPSULE DAILY.   EPINEPHrine 0.3 mg/0.3 mL IJ SOAJ injection See admin instructions.   levalbuterol (XOPENEX HFA) 45 MCG/ACT inhaler Inhale into the lungs as needed.   montelukast (SINGULAIR) 10 MG tablet Take 10 mg by mouth at bedtime.   ondansetron (ZOFRAN-ODT) 4 MG disintegrating tablet Take 1 tablet (4 mg total) by mouth every 6 (six) hours as needed for nausea or vomiting.   pantoprazole (PROTONIX) 40 MG tablet Take 1 tablet (40 mg total) by mouth 2 (two) times daily.   prochlorperazine (COMPAZINE) 5 MG tablet Take 1 tablet (5 mg total) by mouth every 6 (six) hours as needed for refractory nausea / vomiting (if not controlled with zofran).   promethazine (PHENERGAN) 25 MG tablet Take 25 mg by mouth every 6 (six) hours as needed.   scopolamine (TRANSDERM-SCOP) 1 MG/3DAYS as needed.   traZODone (DESYREL) 100 MG tablet Take 300 mg by mouth at bedtime.    [DISCONTINUED] metoprolol succinate (TOPROL-XL) 50 MG 24 hr tablet TAKE ONE TABLET TWICE A DAY WITH OR IMMEDIATELY FOLLOWING A MEAL.   [DISCONTINUED] oxyCODONE-acetaminophen (PERCOCET/ROXICET) 5-325 MG tablet Take 1 tablet by mouth every 6 (six) hours as needed for severe pain.   Cardiac Studies:  09/13/2021: Sinus rhythm at a rate of 89 bpm.  Normal axis.  Otherwise normal EKG.  Compared to EKG 04/24/2021, no significant change.  Assessment:   Precordial pain - Plan: EKG 12-Lead  POTS (postural orthostatic tachycardia syndrome) - Plan: EKG 12-Lead  Recommendations:   Kaitlyn Johnson  is a 40 y.o. female  with hernitated ruptured cervical disc in 2015 and was placed on chronic steroid therapy,  since then she developed Cushing's disease, significant weight gain. 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   Patient has a diagnosis of POTS, celiac disease, superior mesenteric artery syndrome.  Patient symptoms had previously been well controlled with metoprolol and diltiazem.  She is now however having symptomatic episodes.  We will therefore discontinue metoprolol and switch patient to a spewed Will 20 mg twice daily.  We will continue diltiazem.  Also advised patient to continue vitamin B12 supplements and add vitamin B6 to her regimen.  Suspect patient's symptoms are related to underlying POTS.  Will follow-up in 2 weeks to reevaluate patient's symptoms with new medication regimen.  Patient was seen in collaboration with Dr. Einar Gip and he is in agreement with plan.      Alethia Berthold, MD, [facility] 09/13/2021, 11:36 AM Office: [phone]

## 2021-09-18 ENCOUNTER — Ambulatory Visit: Payer: BC Managed Care – PPO | Admitting: Cardiology

## 2021-09-26 NOTE — Progress Notes (Signed)
Primary Physician:  Maude Leriche, PA-C   Patient ID: Kaitlyn Johnson, female    DOB: 30-Aug-1981, 40 y.o.   MRN: [id number]  Subjective:    Chief Complaint  Patient presents with   pots    HPI: Kaitlyn Johnson  is a 40 y.o. female  with hernitated ruptured cervical disc in 2015 and was placed on chronic steroid therapy, since then she developed Cushing's disease, significant weight gain. 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   Patient has a diagnosis of POTS, celiac disease, superior mesenteric artery syndrome.  Patient presents for 2-week follow-up to reevaluate symptoms.  She was seen for urgent visit with exacerbation of POTS symptoms.  She was seen in collaboration with Dr. Einar Gip and switched her from metoprolol to acebutolol 200 mg twice daily as well as advised her to take vitamins B12 and vitamin B6 supplements.  Patient has had no recurrence of orthostatic intolerance since last office visit.  Denies shortness of breath, dizziness, headache, tachycardia.  She is tolerating these futile without issue.  Past Medical History:  Diagnosis Date   Acute appendicitis 02/28/2017   Acute pancreatitis 11/30/2013   Anemia 11/26/2011   Anxiety    Asthma    Celiac and mesenteric artery injury    Cushing's syndrome ([place])    06/21/16- "in remission"   Depression    H/O hiatal hernia    History of kidney stones    Iatrogenic Cushing's syndrome ([place]) 10/02/2013   Nausea vomiting and diarrhea 11/23/2013   Nausea with vomiting 11/30/2013   Palpitations 12/05/2018   Pancreatitis 11/29/2013   POTS (postural orthostatic tachycardia syndrome)    Shortness of breath dyspnea    with exertertion   Sphincter of Oddi dysfunction    Von Willebrand disease    bruising easy   Past Surgical History:  Procedure Laterality Date   ABDOMINAL HYSTERECTOMY  08/2010   ANTERIOR CERVICAL DECOMP/DISCECTOMY FUSION N/A 03/31/2013   Procedure: ANTERIOR CERVICAL DECOMPRESSION/DISCECTOMY  FUSION 1 LEVEL Cervical five-six;  Surgeon: Faythe Ghee, MD;  Location: [place] [facility];  Service: Neurosurgery;  Laterality: N/A;   APPENDECTOMY     Cholangio-Pancreatography with Spintectorotomy + Stent  07/16/2012   01/15/14   CHOLECYSTECTOMY     ESOPHAGOGASTRODUODENOSCOPY (EGD) WITH PROPOFOL N/A 08/19/2018   Procedure: ESOPHAGOGASTRODUODENOSCOPY (EGD) WITH PROPOFOL;  Surgeon: Wilford Corner, MD;  Location: [facility];  Service: Endoscopy;  Laterality: N/A;   LAPAROSCOPIC APPENDECTOMY N/A 02/28/2017   Procedure: APPENDECTOMY LAPAROSCOPIC;  Surgeon: Erroll Luna, MD;  Location: [facility];  Service: General;  Laterality: N/A;   LAPAROSCOPIC ENDOMETRIOSIS FULGURATION     LAPAROSCOPIC GASTRIC SLEEVE RESECTION N/A 07/15/2018   Procedure: LAPAROSCOPIC GASTRIC SLEEVE RESECTION, UPPER ENDO, ERAS Pathway;  Surgeon: Johnathan Hausen, MD;  Location: [facility];  Service: General;  Laterality: N/A;   LUMBAR LAMINECTOMY     LUMBAR LAMINECTOMY/DECOMPRESSION MICRODISCECTOMY Left 06/22/2016   Procedure: MICRODISCECTOMY LEFT LUMBAR FOUR-FIVE;  Surgeon: Consuella Lose, MD;  Location: [facility];  Service: Neurosurgery;  Laterality: Left;   ROUX-EN-Y PROCEDURE  04/1999   Family History  Problem Relation Age of Onset   Hypertension Mother    Social History   Tobacco Use   Smoking status: Never   Smokeless tobacco: Never  Substance Use Topics   Alcohol use: Yes    Alcohol/week: 5.0 standard drinks    Types: 5 Glasses of wine per week    Comment: OCC   Marital Status: Married   ROS   Review  of Systems  Constitutional: Negative for malaise/fatigue and weight gain.  Cardiovascular:  Negative for chest pain, claudication, leg swelling, near-syncope, orthopnea, palpitations, paroxysmal nocturnal dyspnea and syncope.  Respiratory:  Negative for shortness of breath.   Neurological:  Negative for dizziness.     Objective:  Blood pressure (!) 118/92, pulse 76, temperature 98.2 F (36.8 C), height 5'  9" (1.753 m), weight 202 lb (91.6 kg), SpO2 98 %. Body mass index is 29.83 kg/m.   Vitals with BMI 09/27/2021 09/13/2021 05/29/2021  Height 5' 9"  5' 9"  -  Weight 202 lbs 198 lbs -  BMI 70.26 37.85 -  Systolic 885 - 027  Diastolic 92 - 81  Pulse 76 - 105  Orthostatic VS for the past 72 hrs (Last 3 readings):  Patient Position BP Location Cuff Size  09/27/21 0940 Sitting Left Arm Normal    Physical Exam Vitals reviewed.  Constitutional:      Appearance: Normal appearance. She is well-developed.  Cardiovascular:     Rate and Rhythm: Normal rate and regular rhythm.     Pulses: Intact distal pulses.     Heart sounds: Normal heart sounds, S1 normal and S2 normal. No murmur heard.   No gallop.  Pulmonary:     Effort: Pulmonary effort is normal. No accessory muscle usage or respiratory distress.     Breath sounds: Normal breath sounds. No wheezing, rhonchi or rales.  Musculoskeletal:     Right lower leg: No edema.     Left lower leg: No edema.  Neurological:     Mental Status: She is alert and oriented to person, place, and time.  Physical exam unchanged compared to previous office visit.  Laboratory examination:    CMP Latest Ref Rng & Units 11/29/2020 07/28/2019 04/02/2019  Glucose 70 - 99 mg/dL 97 109(H) 83  BUN 6 - 20 mg/dL 12 5(L) 8  Creatinine 0.44 - 1.00 mg/dL 0.76 0.74 0.75  Sodium 135 - 145 mmol/L 138 140 138  Potassium 3.5 - 5.1 mmol/L 3.8 4.5 4.0  Chloride 98 - 111 mmol/L 103 104 106  CO2 22 - 32 mmol/L 25 22 25   Calcium 8.9 - 10.3 mg/dL 9.3 9.4 9.2  Total Protein 6.5 - 8.1 g/dL 7.7 - 7.8  Total Bilirubin 0.3 - 1.2 mg/dL 0.1(L) - <0.1(L)  Alkaline Phos 38 - 126 U/L 58 - 61  AST 15 - 41 U/L 15 - 18  ALT 0 - 44 U/L 14 - 17   CBC Latest Ref Rng & Units 11/29/2020 04/02/2019 08/25/2018  WBC 4.0 - 10.5 K/uL 6.1 8.0 6.9  Hemoglobin 12.0 - 15.0 g/dL 12.5 13.3 12.1  Hematocrit 36.0 - 46.0 % 37.3 40.5 37.8  Platelets 150 - 400 K/uL 241 256 302   Lipid Panel     Component  Value Date/Time   CHOL 187 11/30/2013 0210   TRIG 197 (H) 08/25/2018 0439   HDL 49 11/30/2013 0210   CHOLHDL 3.8 11/30/2013 0210   VLDL 21 11/30/2013 0210   LDLCALC 117 (H) 11/30/2013 0210   External Labs:  None   Allergies   Allergies  Allergen Reactions   Bee Venom Anaphylaxis   Reglan [Metoclopramide] Other (See Comments)    Reaction:  Oculogyric crisis    Shellfish Allergy Anaphylaxis   Wheat Bran Other (See Comments)    Pt has celiac disease.     Adhesive [Tape] Hives   Hydrocod Polst-Cpm Polst Er Other (See Comments)   Hydromorphone Hcl    Measles, Mumps &  Rubella Vac     Other reaction(s): slept for 28 hrs   Gluten Meal Other (See Comments)    Pt has celiac disease.    Sulfa Antibiotics Rash   Tartrazine Rash   Yellow Dye Rash    Medications Prior to Visit:   Outpatient Medications Prior to Visit  Medication Sig Dispense Refill   ABILIFY MAINTENA 400 MG SRER injection Inject 400 mg into the muscle every 28 (twenty-eight) days.     acetaminophen (TYLENOL) 500 MG tablet Take 2 tablets (1,000 mg total) by mouth every 8 (eight) hours. 30 tablet 0   albuterol (VENTOLIN HFA) 108 (90 Base) MCG/ACT inhaler Inhale 1 puff into the lungs as needed.     AMBIEN CR 12.5 MG CR tablet Take 12.5 mg by mouth at bedtime.  1   busPIRone (BUSPAR) 10 MG tablet Take 20 mg by mouth 3 (three) times daily.     clonazePAM (KLONOPIN) 1 MG tablet Take 1 tablet (1 mg total) by mouth 2 (two) times daily as needed for anxiety. (Patient taking differently: Take 1 mg by mouth daily as needed for anxiety.) 30 tablet 0   CYANOCOBALAMIN IJ Inject 1 mL as directed every 30 (thirty) days.     DEXILANT 30 MG capsule Take 1 capsule by mouth 2 (two) times daily.     diltiazem (CARDIZEM CD) 180 MG 24 hr capsule TAKE 1 CAPSULE DAILY. 90 capsule 3   EPINEPHrine 0.3 mg/0.3 mL IJ SOAJ injection See admin instructions.     influenza vac split quadrivalent PF (FLUARIX) 0.5 ML injection Inject into the muscle.  0.5 mL 0   levalbuterol (XOPENEX HFA) 45 MCG/ACT inhaler Inhale into the lungs as needed.     montelukast (SINGULAIR) 10 MG tablet Take 10 mg by mouth at bedtime.     ondansetron (ZOFRAN-ODT) 4 MG disintegrating tablet Take 1 tablet (4 mg total) by mouth every 6 (six) hours as needed for nausea or vomiting. 20 tablet 0   pantoprazole (PROTONIX) 40 MG tablet Take 1 tablet (40 mg total) by mouth 2 (two) times daily. 30 tablet 1   prochlorperazine (COMPAZINE) 5 MG tablet Take 1 tablet (5 mg total) by mouth every 6 (six) hours as needed for refractory nausea / vomiting (if not controlled with zofran). 20 tablet 0   promethazine (PHENERGAN) 25 MG tablet Take 25 mg by mouth every 6 (six) hours as needed.     scopolamine (TRANSDERM-SCOP) 1 MG/3DAYS as needed.     traZODone (DESYREL) 100 MG tablet Take 300 mg by mouth at bedtime.      acebutolol (SECTRAL) 200 MG capsule Take 1 capsule (200 mg total) by mouth 2 (two) times daily. 60 capsule 3   lamoTRIgine (LAMICTAL) 25 MG tablet Take 1 tablet by mouth at bedtime.     No facility-administered medications prior to visit.   Final Medications at End of Visit    Current Meds  Medication Sig   ABILIFY MAINTENA 400 MG SRER injection Inject 400 mg into the muscle every 28 (twenty-eight) days.   acetaminophen (TYLENOL) 500 MG tablet Take 2 tablets (1,000 mg total) by mouth every 8 (eight) hours.   albuterol (VENTOLIN HFA) 108 (90 Base) MCG/ACT inhaler Inhale 1 puff into the lungs as needed.   AMBIEN CR 12.5 MG CR tablet Take 12.5 mg by mouth at bedtime.   busPIRone (BUSPAR) 10 MG tablet Take 20 mg by mouth 3 (three) times daily.   clonazePAM (KLONOPIN) 1 MG tablet Take 1 tablet (  1 mg total) by mouth 2 (two) times daily as needed for anxiety. (Patient taking differently: Take 1 mg by mouth daily as needed for anxiety.)   CYANOCOBALAMIN IJ Inject 1 mL as directed every 30 (thirty) days.   DEXILANT 30 MG capsule Take 1 capsule by mouth 2 (two) times daily.    diltiazem (CARDIZEM CD) 180 MG 24 hr capsule TAKE 1 CAPSULE DAILY.   EPINEPHrine 0.3 mg/0.3 mL IJ SOAJ injection See admin instructions.   influenza vac split quadrivalent PF (FLUARIX) 0.5 ML injection Inject into the muscle.   levalbuterol (XOPENEX HFA) 45 MCG/ACT inhaler Inhale into the lungs as needed.   montelukast (SINGULAIR) 10 MG tablet Take 10 mg by mouth at bedtime.   ondansetron (ZOFRAN-ODT) 4 MG disintegrating tablet Take 1 tablet (4 mg total) by mouth every 6 (six) hours as needed for nausea or vomiting.   pantoprazole (PROTONIX) 40 MG tablet Take 1 tablet (40 mg total) by mouth 2 (two) times daily.   prochlorperazine (COMPAZINE) 5 MG tablet Take 1 tablet (5 mg total) by mouth every 6 (six) hours as needed for refractory nausea / vomiting (if not controlled with zofran).   promethazine (PHENERGAN) 25 MG tablet Take 25 mg by mouth every 6 (six) hours as needed.   scopolamine (TRANSDERM-SCOP) 1 MG/3DAYS as needed.   traZODone (DESYREL) 100 MG tablet Take 300 mg by mouth at bedtime.    [DISCONTINUED] acebutolol (SECTRAL) 200 MG capsule Take 1 capsule (200 mg total) by mouth 2 (two) times daily.   Cardiac Studies:  09/13/2021: Sinus rhythm at a rate of 89 bpm.  Normal axis.  Otherwise normal EKG.  Compared to EKG 04/24/2021, no significant change.  Assessment:   POTS (postural orthostatic tachycardia syndrome)  Recommendations:   Ala Kratz  is a 40 y.o. female  with hernitated ruptured cervical disc in 2015 and was placed on chronic steroid therapy, since then she developed Cushing's disease, significant weight gain. 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   Patient has a diagnosis of POTS, celiac disease, superior mesenteric artery syndrome.  Patient presents for 2-week follow-up to reevaluate symptoms.  She was seen for urgent visit with exacerbation of POTS symptoms.  She was seen in collaboration with Dr. Einar Gip and switched her from metoprolol to  acebutolol 200 mg twice daily as well as advised her to take vitamins B12 and vitamin B6 supplements.  Patient's symptoms have essentially resolved with switching from metoprolol to acebutolol which she is tolerating without issue.  Will not make changes at today's office visit.  She is stable from a cardiovascular standpoint.  Follow-up as previously scheduled in June with Dr. Einar Gip per patient preference.   Alethia Berthold, PA-C 09/27/2021, 10:03 AM Office: [phone]

## 2021-09-27 ENCOUNTER — Other Ambulatory Visit: Payer: Self-pay

## 2021-09-27 ENCOUNTER — Other Ambulatory Visit: Payer: Self-pay | Admitting: Family Medicine

## 2021-09-27 ENCOUNTER — Encounter: Payer: Self-pay | Admitting: Student

## 2021-09-27 ENCOUNTER — Ambulatory Visit: Payer: BC Managed Care – PPO | Admitting: Student

## 2021-09-27 VITALS — BP 118/92 | HR 76 | Temp 98.2°F | Ht 69.0 in | Wt 202.0 lb

## 2021-09-27 DIAGNOSIS — G90A Postural orthostatic tachycardia syndrome (POTS): Secondary | ICD-10-CM

## 2021-09-27 DIAGNOSIS — R519 Headache, unspecified: Secondary | ICD-10-CM

## 2021-09-27 MED ORDER — ACEBUTOLOL HCL 200 MG PO CAPS: 200.0000 mg | ORAL_CAPSULE | Freq: Two times a day (BID) | ORAL | 3 refills | Status: DC

## 2021-10-06 ENCOUNTER — Other Ambulatory Visit: Payer: Self-pay

## 2021-10-06 ENCOUNTER — Ambulatory Visit
Admission: RE | Admit: 2021-10-06 | Discharge: 2021-10-06 | Disposition: A | Discharge status: Home / Self Care | Payer: BC Managed Care – PPO | Source: Ambulatory Visit | Attending: Family Medicine | Admitting: Family Medicine

## 2021-10-06 DIAGNOSIS — J322 Chronic ethmoidal sinusitis: Secondary | ICD-10-CM | POA: Diagnosis not present

## 2021-10-06 DIAGNOSIS — R519 Headache, unspecified: Secondary | ICD-10-CM | POA: Diagnosis not present

## 2021-10-06 MED ORDER — GADOBENATE DIMEGLUMINE 529 MG/ML IV SOLN
20.0000 mL | Freq: Once | INTRAVENOUS | Status: AC | PRN
  Administered 2021-10-06: 20 mL via INTRAVENOUS

## 2021-10-14 ENCOUNTER — Other Ambulatory Visit: Payer: BC Managed Care – PPO

## 2021-11-07 DIAGNOSIS — F411 Generalized anxiety disorder: Secondary | ICD-10-CM | POA: Diagnosis not present

## 2021-11-07 DIAGNOSIS — F331 Major depressive disorder, recurrent, moderate: Secondary | ICD-10-CM | POA: Diagnosis not present

## 2021-11-07 DIAGNOSIS — F605 Obsessive-compulsive personality disorder: Secondary | ICD-10-CM | POA: Diagnosis not present

## 2021-11-11 ENCOUNTER — Emergency Department (HOSPITAL_BASED_OUTPATIENT_CLINIC_OR_DEPARTMENT_OTHER)
Admission: EM | Admit: 2021-11-11 | Discharge: 2021-11-11 | Disposition: A | Discharge status: Home / Self Care | Payer: BC Managed Care – PPO | Attending: Emergency Medicine | Admitting: Emergency Medicine

## 2021-11-11 ENCOUNTER — Emergency Department (HOSPITAL_BASED_OUTPATIENT_CLINIC_OR_DEPARTMENT_OTHER): Payer: BC Managed Care – PPO

## 2021-11-11 ENCOUNTER — Encounter (HOSPITAL_BASED_OUTPATIENT_CLINIC_OR_DEPARTMENT_OTHER): Payer: Self-pay | Admitting: Emergency Medicine

## 2021-11-11 ENCOUNTER — Other Ambulatory Visit: Payer: Self-pay

## 2021-11-11 DIAGNOSIS — K834 Spasm of sphincter of Oddi: Secondary | ICD-10-CM

## 2021-11-11 DIAGNOSIS — R1011 Right upper quadrant pain: Secondary | ICD-10-CM | POA: Diagnosis not present

## 2021-11-11 DIAGNOSIS — R112 Nausea with vomiting, unspecified: Secondary | ICD-10-CM | POA: Diagnosis not present

## 2021-11-11 DIAGNOSIS — R109 Unspecified abdominal pain: Secondary | ICD-10-CM | POA: Diagnosis not present

## 2021-11-11 DIAGNOSIS — R1013 Epigastric pain: Secondary | ICD-10-CM | POA: Diagnosis not present

## 2021-11-11 LAB — COMPREHENSIVE METABOLIC PANEL
ALT: 18 U/L (ref 0–44)
AST: 21 U/L (ref 15–41)
Albumin: 4.1 g/dL (ref 3.5–5.0)
Alkaline Phosphatase: 53 U/L (ref 38–126)
Anion gap: 8 (ref 5–15)
BUN: 13 mg/dL (ref 6–20)
CO2: 22 mmol/L (ref 22–32)
Calcium: 9 mg/dL (ref 8.9–10.3)
Chloride: 105 mmol/L (ref 98–111)
Creatinine, Ser: 0.78 mg/dL (ref 0.44–1.00)
GFR, Estimated: >60 mL/min (ref >60)
Glucose, Bld: 105 mg/dL — ABNORMAL HIGH (ref 70–99)
Potassium: 3.2 mmol/L — ABNORMAL LOW (ref 3.5–5.1)
Sodium: 135 mmol/L (ref 135–145)
Total Bilirubin: 0.6 mg/dL (ref 0.3–1.2)
Total Protein: 7.7 g/dL (ref 6.5–8.1)

## 2021-11-11 LAB — CBC
HCT: 37 % (ref 36.0–46.0)
Hemoglobin: 12.7 g/dL (ref 12.0–15.0)
MCH: 31.4 pg (ref 26.0–34.0)
MCHC: 34.3 g/dL (ref 30.0–36.0)
MCV: 91.6 fL (ref 80.0–100.0)
Platelets: 301 10*3/uL (ref 150–400)
RBC: 4.04 MIL/uL (ref 3.87–5.11)
RDW: 13.2 % (ref 11.5–15.5)
WBC: 9.7 10*3/uL (ref 4.0–10.5)
nRBC: 0 % (ref 0.0–0.2)

## 2021-11-11 LAB — URINALYSIS, ROUTINE W REFLEX MICROSCOPIC
Bilirubin Urine: NEGATIVE
Glucose, UA: NEGATIVE mg/dL
Hgb urine dipstick: NEGATIVE
Ketones, ur: NEGATIVE mg/dL
Leukocytes,Ua: NEGATIVE
Nitrite: NEGATIVE
Protein, ur: NEGATIVE mg/dL
Specific Gravity, Urine: 1.015 (ref 1.005–1.030)
pH: 6 (ref 5.0–8.0)

## 2021-11-11 LAB — LIPASE, BLOOD: Lipase: 44 U/L (ref 11–51)

## 2021-11-11 MED ORDER — MORPHINE SULFATE (PF) 4 MG/ML IV SOLN
4.0000 mg | Freq: Once | INTRAVENOUS | Status: AC
  Administered 2021-11-11: 4 mg via INTRAVENOUS
  Filled 2021-11-11: qty 1

## 2021-11-11 MED ORDER — OXYCODONE-ACETAMINOPHEN 5-325 MG PO TABS: 1.0000 | ORAL_TABLET | Freq: Four times a day (QID) | ORAL | 0 refills | Status: DC | PRN

## 2021-11-11 MED ORDER — ONDANSETRON HCL 4 MG/2ML IJ SOLN
4.0000 mg | Freq: Once | INTRAMUSCULAR | Status: AC | PRN
  Administered 2021-11-11: 4 mg via INTRAVENOUS
  Filled 2021-11-11: qty 2

## 2021-11-11 MED ORDER — IOHEXOL 300 MG/ML  SOLN
100.0000 mL | Freq: Once | INTRAMUSCULAR | Status: AC | PRN
  Administered 2021-11-11: 100 mL via INTRAVENOUS

## 2021-11-11 MED ORDER — ONDANSETRON 8 MG PO TBDP: ORAL_TABLET | ORAL | 0 refills | Status: DC

## 2021-11-11 NOTE — ED Provider Notes (Signed)
[place] [facility] EMERGENCY DEPARTMENT Provider Note   CSN: [id number] Arrival date & time: 11/11/21  0001     History  Chief Complaint  Patient presents with   Abdominal Pain    Kaitlyn Johnson is a 41 y.o. female.  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  This was performed at [facility].  Her GI doctor is Dr. Terrace Arabia.  Patient presenting today with complaints of right upper quadrant and epigastric abdominal pain that began at approximately 9 PM.  This began in the absence of any injury or trauma.  She describes constant pain that radiates to her right side.  She has felt nauseated but has not vomited.  She denies any fevers or chills.  She denies any diarrhea or constipation.  The history is provided by the patient.  Abdominal Pain Pain location:  Epigastric and RUQ Pain quality: cramping   Pain radiates to:  R flank Pain severity:  Moderate Onset quality:  Sudden Timing:  Constant Progression:  Worsening Chronicity:  New Context: not alcohol use   Relieved by:  Nothing Worsened by:  Movement and palpation     Home Medications Prior to Admission medications   Medication Sig Start Date End Date Taking? Authorizing Provider  ABILIFY MAINTENA 400 MG SRER injection Inject 400 mg into the muscle every 28 (twenty-eight) days. 05/25/21   [provider]  acebutolol (SECTRAL) 200 MG capsule Take 1 capsule (200 mg total) by mouth 2 (two) times daily. 09/27/21   Cantwell, Celeste C, PA-C  acetaminophen (TYLENOL) 500 MG tablet Take 2 tablets (1,000 mg total) by mouth every 8 (eight) hours. 08/25/18   Meuth, Brooke A, PA-C  albuterol (VENTOLIN HFA) 108 (90 Base) MCG/ACT inhaler Inhale 1 puff into the lungs as needed. 01/20/15   [provider]  AMBIEN CR 12.5 MG CR tablet Take 12.5 mg by mouth at bedtime. 10/31/15   [provider]  busPIRone (BUSPAR) 10 MG  tablet Take 20 mg by mouth 3 (three) times daily. 02/20/21   [provider]  clonazePAM (KLONOPIN) 1 MG tablet Take 1 tablet (1 mg total) by mouth 2 (two) times daily as needed for anxiety. Patient taking differently: Take 1 mg by mouth daily as needed for anxiety. 12/07/13   Theodis Blaze, MD  CYANOCOBALAMIN IJ Inject 1 mL as directed every 30 (thirty) days.    [provider]  DEXILANT 30 MG capsule Take 1 capsule by mouth 2 (two) times daily. 03/29/21   [provider]  diltiazem (CARDIZEM CD) 180 MG 24 hr capsule TAKE 1 CAPSULE DAILY. 06/19/21   Adrian Prows, MD  EPINEPHrine 0.3 mg/0.3 mL IJ SOAJ injection See admin instructions. 07/07/13   [provider]  influenza vac split quadrivalent PF (FLUARIX) 0.5 ML injection Inject into the muscle. 08/22/21   Carlyle Basques, MD  lamoTRIgine (LAMICTAL) 25 MG tablet Take 1 tablet by mouth at bedtime. 09/18/21   [provider]  levalbuterol Penne Lash HFA) 45 MCG/ACT inhaler Inhale into the lungs as needed.    [provider]  montelukast (SINGULAIR) 10 MG tablet Take 10 mg by mouth at bedtime.    [provider]  ondansetron (ZOFRAN-ODT) 4 MG disintegrating tablet Take 1 tablet (4 mg total) by mouth every 6 (six) hours as needed for nausea or vomiting. 08/25/18   Meuth, Brooke A, PA-C  pantoprazole (PROTONIX) 40 MG tablet Take 1 tablet (40 mg  total) by mouth 2 (two) times daily. 08/25/18   Meuth, Brooke A, PA-C  prochlorperazine (COMPAZINE) 5 MG tablet Take 1 tablet (5 mg total) by mouth every 6 (six) hours as needed for refractory nausea / vomiting (if not controlled with zofran). 08/25/18   Meuth, Blaine Hamper, PA-C  promethazine (PHENERGAN) 25 MG tablet Take 25 mg by mouth every 6 (six) hours as needed. 11/28/20   [provider]  scopolamine (TRANSDERM-SCOP) 1 MG/3DAYS as needed.    [provider]  traZODone (DESYREL) 100 MG tablet Take 300 mg by mouth at bedtime.     [provider]      Allergies    Bee venom; Reglan [metoclopramide]; Shellfish allergy; Wheat bran; Adhesive [tape]; Hydrocod polst-cpm polst er; Hydromorphone hcl; Measles, mumps & rubella vac; Gluten meal; Sulfa antibiotics; Tartrazine; and Yellow dye    Review of Systems   Review of Systems  Gastrointestinal:  Positive for abdominal pain.  All other systems reviewed and are negative.  Physical Exam Updated Vital Signs BP 113/87 (BP Location: Left Arm)    Pulse 90    Temp 98.3 F (36.8 C) (Oral)    Resp 18    Ht 5' 8"  (1.727 m)    Wt 87.5 kg    SpO2 99%    BMI 29.35 kg/m  Physical Exam Vitals and nursing note reviewed.  Constitutional:      General: She is not in acute distress.    Appearance: She is well-developed. She is not diaphoretic.  HENT:     Head: Normocephalic and atraumatic.  Cardiovascular:     Rate and Rhythm: Normal rate and regular rhythm.     Heart sounds: No murmur heard.   No friction rub. No gallop.  Pulmonary:     Effort: Pulmonary effort is normal. No respiratory distress.     Breath sounds: Normal breath sounds. No wheezing.  Abdominal:     General: Bowel sounds are normal. There is no distension.     Palpations: Abdomen is soft.     Tenderness: There is abdominal tenderness in the right upper quadrant and epigastric area. There is no right CVA tenderness, left CVA tenderness, guarding or rebound.  Musculoskeletal:        General: Normal range of motion.     Cervical back: Normal range of motion and neck supple.  Skin:    General: Skin is warm and dry.  Neurological:     General: No focal deficit present.     Mental Status: She is alert and oriented to person, place, and time.    ED Results / Procedures / Treatments   Labs (all labs ordered are listed, but only abnormal results are displayed) Labs Reviewed  COMPREHENSIVE METABOLIC PANEL - Abnormal; Notable for the following components:      Result Value   Potassium 3.2 (*)    Glucose, Bld 105  (*)    All other components within normal limits  LIPASE, BLOOD  CBC  URINALYSIS, ROUTINE W REFLEX MICROSCOPIC    EKG EKG Interpretation  Date/Time:  Saturday November 11 2021 00:14:51 EST Ventricular Rate:  90 PR Interval:  144 QRS Duration: 78 QT Interval:  380 QTC Calculation: 464 R Axis:   74 Text Interpretation: Normal sinus rhythm Normal ECG Confirmed by Veryl Speak [phone]) on 11/11/2021 3:34:14 AM  Radiology No results found.  Procedures Procedures    Medications Ordered in ED Medications  morphine 4 MG/ML injection 4 mg (has no administration in  time range)  ondansetron (ZOFRAN) injection 4 mg (4 mg Intravenous Given 11/11/21 0158)    ED Course/ Medical Decision Making/ A&P  This patient presents to the ED for concern of abdominal pain, this involves an extensive number of treatment options, and is a complaint that carries with it a high risk of complications and morbidity.  The differential diagnosis includes bowel obstruction, biliary duct obstruction, sphincter of Oddi dysfunction, cholangitis   Co morbidities that complicate the patient evaluation  Patient with history of center of Oddi dysfunction status post sphincterotomy in the past.  She is followed by GI at [facility].   Additional history obtained:  No outside records obtained or necessary   Lab Tests:  I Ordered, and personally interpreted labs.  The pertinent results include: CBC, CMP, lipase, all of which are essentially unremarkable   Imaging Studies ordered:  I ordered imaging studies including CT scan of the abdomen and pelvis I independently visualized and interpreted imaging which showed no acute intra-abdominal process I agree with the radiologist interpretation   Cardiac Monitoring:  The patient was maintained on a cardiac monitor.  I personally viewed and interpreted the cardiac monitored which showed an underlying rhythm of: Sinus   Medicines ordered and prescription drug  management:  I ordered medication including morphine for pain and Zofran for nausea Reevaluation of the patient after these medicines showed that the patient improved I have reviewed the patients home medicines and have made adjustments as needed   Test Considered:  No other test considered or indicated   Critical Interventions:  IV fluids and pain medication   Consultations Obtained:  No further consultations indicated.   Problem List / ED Course:  Patient with past medical history as per HPI who presents with epigastric pain, the etiology of which I am uncertain.  There does not appear to be any evidence for biliary obstruction based on the CT results.  Laboratory studies are reassuring.  Patient is feeling better after receiving IV hydration and pain and nausea medicine.  At this point, I feel as though patient can safely be discharged with outpatient follow-up with her GI doctors and return as needed if symptoms worsen.   Reevaluation:  After the interventions noted above, I reevaluated the patient and found that they have :improved   Social Determinants of Health:  None   Dispostion:  After consideration of the diagnostic results and the patients response to treatment, I feel that the patent would benefit from outpatient medication for pain and nausea and follow-up with her GI doctor if symptoms are not improving.    Final Clinical Impression(s) / ED Diagnoses Final diagnoses:  None    Rx / DC Orders ED Discharge Orders     None         Veryl Speak, MD 11/11/21 [id number]

## 2021-11-11 NOTE — ED Triage Notes (Signed)
Pt c/o right upper abdominal pain that radiates around to back onset 2000. Pt reports nausea as well.

## 2021-11-11 NOTE — Discharge Instructions (Signed)
Begin taking Percocet as prescribed as needed for pain.  Begin taking Zofran as prescribed as needed for nausea.  Follow-up with your gastroenterologist if symptoms are not improving in the next few days, and return to the ER if you develop high fever, worsening pain, bloody stools, or other new and concerning symptoms.

## 2021-11-20 DIAGNOSIS — G4700 Insomnia, unspecified: Secondary | ICD-10-CM | POA: Diagnosis not present

## 2021-11-20 DIAGNOSIS — D6804 Acquired von Willebrand disease: Secondary | ICD-10-CM | POA: Diagnosis not present

## 2021-11-20 DIAGNOSIS — R1010 Upper abdominal pain, unspecified: Secondary | ICD-10-CM | POA: Diagnosis not present

## 2021-11-20 DIAGNOSIS — Z91030 Bee allergy status: Secondary | ICD-10-CM | POA: Diagnosis not present

## 2021-11-20 DIAGNOSIS — R109 Unspecified abdominal pain: Secondary | ICD-10-CM | POA: Diagnosis not present

## 2021-11-20 DIAGNOSIS — R1012 Left upper quadrant pain: Secondary | ICD-10-CM | POA: Diagnosis not present

## 2021-11-20 DIAGNOSIS — F329 Major depressive disorder, single episode, unspecified: Secondary | ICD-10-CM | POA: Diagnosis not present

## 2021-11-20 DIAGNOSIS — Q402 Other specified congenital malformations of stomach: Secondary | ICD-10-CM | POA: Diagnosis not present

## 2021-11-20 DIAGNOSIS — Z20822 Contact with and (suspected) exposure to covid-19: Secondary | ICD-10-CM | POA: Diagnosis not present

## 2021-11-20 DIAGNOSIS — K3184 Gastroparesis: Secondary | ICD-10-CM | POA: Diagnosis not present

## 2021-11-20 DIAGNOSIS — Z980 Intestinal bypass and anastomosis status: Secondary | ICD-10-CM | POA: Diagnosis not present

## 2021-11-20 DIAGNOSIS — R1013 Epigastric pain: Secondary | ICD-10-CM | POA: Diagnosis not present

## 2021-11-20 DIAGNOSIS — Z882 Allergy status to sulfonamides status: Secondary | ICD-10-CM | POA: Diagnosis not present

## 2021-11-20 DIAGNOSIS — Z91018 Allergy to other foods: Secondary | ICD-10-CM | POA: Diagnosis not present

## 2021-11-20 DIAGNOSIS — R197 Diarrhea, unspecified: Secondary | ICD-10-CM | POA: Diagnosis not present

## 2021-11-20 DIAGNOSIS — Z888 Allergy status to other drugs, medicaments and biological substances status: Secondary | ICD-10-CM | POA: Diagnosis not present

## 2021-11-20 DIAGNOSIS — Z91013 Allergy to seafood: Secondary | ICD-10-CM | POA: Diagnosis not present

## 2021-11-20 DIAGNOSIS — F419 Anxiety disorder, unspecified: Secondary | ICD-10-CM | POA: Diagnosis not present

## 2021-11-20 DIAGNOSIS — G90A Postural orthostatic tachycardia syndrome (POTS): Secondary | ICD-10-CM | POA: Diagnosis not present

## 2021-11-20 DIAGNOSIS — Z9884 Bariatric surgery status: Secondary | ICD-10-CM | POA: Diagnosis not present

## 2021-11-20 DIAGNOSIS — K3189 Other diseases of stomach and duodenum: Secondary | ICD-10-CM | POA: Diagnosis not present

## 2021-11-20 DIAGNOSIS — J45909 Unspecified asthma, uncomplicated: Secondary | ICD-10-CM | POA: Diagnosis not present

## 2021-11-20 DIAGNOSIS — R112 Nausea with vomiting, unspecified: Secondary | ICD-10-CM | POA: Diagnosis not present

## 2021-11-21 DIAGNOSIS — R1013 Epigastric pain: Secondary | ICD-10-CM | POA: Diagnosis not present

## 2021-11-21 DIAGNOSIS — R1012 Left upper quadrant pain: Secondary | ICD-10-CM | POA: Diagnosis not present

## 2021-11-30 DIAGNOSIS — K3184 Gastroparesis: Secondary | ICD-10-CM | POA: Diagnosis not present

## 2021-11-30 DIAGNOSIS — R112 Nausea with vomiting, unspecified: Secondary | ICD-10-CM | POA: Diagnosis not present

## 2021-12-06 ENCOUNTER — Encounter: Payer: Self-pay | Admitting: Cardiology

## 2021-12-06 NOTE — Telephone Encounter (Signed)
From pt

## 2021-12-13 DIAGNOSIS — K529 Noninfective gastroenteritis and colitis, unspecified: Secondary | ICD-10-CM | POA: Diagnosis not present

## 2021-12-13 DIAGNOSIS — R110 Nausea: Secondary | ICD-10-CM | POA: Diagnosis not present

## 2021-12-15 ENCOUNTER — Other Ambulatory Visit: Payer: Self-pay

## 2021-12-15 ENCOUNTER — Emergency Department (HOSPITAL_BASED_OUTPATIENT_CLINIC_OR_DEPARTMENT_OTHER)
Admission: EM | Admit: 2021-12-15 | Discharge: 2021-12-15 | Disposition: A | Discharge status: Home / Self Care | Payer: BC Managed Care – PPO | Attending: Emergency Medicine | Admitting: Emergency Medicine

## 2021-12-15 ENCOUNTER — Encounter (HOSPITAL_BASED_OUTPATIENT_CLINIC_OR_DEPARTMENT_OTHER): Payer: Self-pay

## 2021-12-15 DIAGNOSIS — R109 Unspecified abdominal pain: Secondary | ICD-10-CM | POA: Diagnosis not present

## 2021-12-15 DIAGNOSIS — R197 Diarrhea, unspecified: Secondary | ICD-10-CM

## 2021-12-15 DIAGNOSIS — R112 Nausea with vomiting, unspecified: Secondary | ICD-10-CM | POA: Diagnosis not present

## 2021-12-15 DIAGNOSIS — J45909 Unspecified asthma, uncomplicated: Secondary | ICD-10-CM | POA: Insufficient documentation

## 2021-12-15 DIAGNOSIS — E860 Dehydration: Secondary | ICD-10-CM | POA: Insufficient documentation

## 2021-12-15 LAB — CBC WITH DIFFERENTIAL/PLATELET
Abs Immature Granulocytes: 0.03 10*3/uL (ref 0.00–0.07)
Basophils Absolute: 0 10*3/uL (ref 0.0–0.1)
Basophils Relative: 0 %
Eosinophils Absolute: 0.1 10*3/uL (ref 0.0–0.5)
Eosinophils Relative: 1 %
HCT: 36.2 % (ref 36.0–46.0)
Hemoglobin: 12.2 g/dL (ref 12.0–15.0)
Immature Granulocytes: 0 %
Lymphocytes Relative: 21 %
Lymphs Abs: 1.5 10*3/uL (ref 0.7–4.0)
MCH: 31.8 pg (ref 26.0–34.0)
MCHC: 33.7 g/dL (ref 30.0–36.0)
MCV: 94.3 fL (ref 80.0–100.0)
Monocytes Absolute: 0.5 10*3/uL (ref 0.1–1.0)
Monocytes Relative: 7 %
Neutro Abs: 5 10*3/uL (ref 1.7–7.7)
Neutrophils Relative %: 71 %
Platelets: 285 10*3/uL (ref 150–400)
RBC: 3.84 MIL/uL — ABNORMAL LOW (ref 3.87–5.11)
RDW: 14 % (ref 11.5–15.5)
WBC: 7.2 10*3/uL (ref 4.0–10.5)
nRBC: 0 % (ref 0.0–0.2)

## 2021-12-15 LAB — URINALYSIS, ROUTINE W REFLEX MICROSCOPIC
Bilirubin Urine: NEGATIVE
Glucose, UA: NEGATIVE mg/dL
Hgb urine dipstick: NEGATIVE
Ketones, ur: NEGATIVE mg/dL
Leukocytes,Ua: NEGATIVE
Nitrite: NEGATIVE
Protein, ur: NEGATIVE mg/dL
Specific Gravity, Urine: 1.015 (ref 1.005–1.030)
pH: 8.5 — ABNORMAL HIGH (ref 5.0–8.0)

## 2021-12-15 LAB — COMPREHENSIVE METABOLIC PANEL
ALT: 20 U/L (ref 0–44)
AST: 21 U/L (ref 15–41)
Albumin: 4.4 g/dL (ref 3.5–5.0)
Alkaline Phosphatase: 55 U/L (ref 38–126)
Anion gap: 8 (ref 5–15)
BUN: 9 mg/dL (ref 6–20)
CO2: 24 mmol/L (ref 22–32)
Calcium: 8.8 mg/dL — ABNORMAL LOW (ref 8.9–10.3)
Chloride: 102 mmol/L (ref 98–111)
Creatinine, Ser: 0.72 mg/dL (ref 0.44–1.00)
GFR, Estimated: >60 mL/min (ref >60)
Glucose, Bld: 101 mg/dL — ABNORMAL HIGH (ref 70–99)
Potassium: 4 mmol/L (ref 3.5–5.1)
Sodium: 134 mmol/L — ABNORMAL LOW (ref 135–145)
Total Bilirubin: 0.3 mg/dL (ref 0.3–1.2)
Total Protein: 8.1 g/dL (ref 6.5–8.1)

## 2021-12-15 LAB — LIPASE, BLOOD: Lipase: 30 U/L (ref 11–51)

## 2021-12-15 MED ORDER — SODIUM CHLORIDE 0.9 % IV BOLUS
1000.0000 mL | Freq: Once | INTRAVENOUS | Status: AC
  Administered 2021-12-15: 1000 mL via INTRAVENOUS

## 2021-12-15 MED ORDER — MORPHINE SULFATE (PF) 4 MG/ML IV SOLN
4.0000 mg | Freq: Once | INTRAVENOUS | Status: AC
  Administered 2021-12-15: 4 mg via INTRAVENOUS
  Filled 2021-12-15: qty 1

## 2021-12-15 MED ORDER — ONDANSETRON HCL 4 MG/2ML IJ SOLN
4.0000 mg | Freq: Once | INTRAMUSCULAR | Status: AC
  Administered 2021-12-15: 4 mg via INTRAVENOUS
  Filled 2021-12-15: qty 2

## 2021-12-15 NOTE — ED Triage Notes (Signed)
First contact with patient. Patient arrived via triage with complaints of n/v/d x 3 weeks - Patient was recently discharged from [facility] for same. Patient is A&OX 4. Respirations even/unlabored. Patient was instructed to come to ED for evaluation for dehydration.  Patient requesting to wait for IV to obtain blood work due to weak veins.

## 2021-12-15 NOTE — Discharge Instructions (Signed)
He was seen in the emergency room today with nausea, vomiting, diarrhea.  We have provided some IV fluid hydration and checked lab work which is reassuring.  Please continue your home medications and follow closely with your GI doctors moving forward.

## 2021-12-15 NOTE — ED Provider Notes (Signed)
Emergency Department Provider Note   I have reviewed the triage vital signs and the nursing notes.   HISTORY  Chief Complaint Nausea, Emesis, Abdominal Pain, and Diarrhea   HPI Kaitlyn Johnson is a 41 y.o. female past history reviewed below including POTS, chronic nausea/vomiting/diarrhea, sphincter of Oddi dysfunction and admit to [facility] in January for abdominal pain and dehydration presents to the emergency department with return of symptoms.  She is followed by her GI team at [facility] and has been compliant with her home medications including Zofran, scopolamine patch, Lomotil.  She feels that the scopolamine dried up some of her secretions but continues to feel nauseated.  She is not having any pain into the chest.  She has intermittent abdominal cramping with multiple episodes of diarrhea.  No blood in the emesis or stool.  No fevers.  With symptoms difficult to control at home she presents today for evaluation and treatment.      Past Medical History:  Diagnosis Date   Acute appendicitis 02/28/2017   Acute pancreatitis 11/30/2013   Anemia 11/26/2011   Anxiety    Asthma    Celiac and mesenteric artery injury    Cushing's syndrome ([place])    06/21/16- "in remission"   Depression    H/O hiatal hernia    History of kidney stones    Iatrogenic Cushing's syndrome ([place]) 10/02/2013   Nausea vomiting and diarrhea 11/23/2013   Nausea with vomiting 11/30/2013   Palpitations 12/05/2018   Pancreatitis 11/29/2013   POTS (postural orthostatic tachycardia syndrome)    Shortness of breath dyspnea    with exertertion   Sphincter of Oddi dysfunction    Von Willebrand disease    bruising easy    Review of Systems  Constitutional: No fever/chills. Positive fatigue and generalized weakness.  Eyes: No visual changes. ENT: No sore throat. Cardiovascular: Denies chest pain. Respiratory: Denies shortness of breath. Gastrointestinal: No abdominal pain. Positive nausea, vomiting, and diarrhea.  No  constipation. Genitourinary: Negative for dysuria. Musculoskeletal: Negative for back pain. Skin: Negative for rash. Neurological: Negative for headaches, focal weakness or numbness.   ____________________________________________   PHYSICAL EXAM:  VITAL SIGNS: ED Triage Vitals  Enc Vitals Group     BP 12/15/21 1207 127/81     Pulse Rate 12/15/21 1207 83     Resp 12/15/21 1207 18     Temp 12/15/21 1207 97.9 F (36.6 C)     Temp Source 12/15/21 1207 Oral     SpO2 12/15/21 1207 100 %     Weight 12/15/21 1205 185 lb (83.9 kg)     Height 12/15/21 1205 5' 9"  (1.753 m)   Constitutional: Alert and oriented. Well appearing and in no acute distress. Eyes: Conjunctivae are normal.  Head: Atraumatic. Nose: No congestion/rhinnorhea. Mouth/Throat: Mucous membranes are slightly dry. Oropharynx non-erythematous. Neck: No stridor.   Cardiovascular: Normal rate, regular rhythm. Good peripheral circulation. Grossly normal heart sounds.   Respiratory: Normal respiratory effort.  No retractions. Lungs CTAB. Gastrointestinal: Soft and nontender. No distention.  Musculoskeletal: No lower extremity tenderness nor edema. No gross deformities of extremities. Neurologic:  Normal speech and language. No gross focal neurologic deficits are appreciated.  Skin:  Skin is warm, dry and intact. No rash noted.   ____________________________________________   LABS (all labs ordered are listed, but only abnormal results are displayed)  Labs Reviewed  COMPREHENSIVE METABOLIC PANEL - Abnormal; Notable for the following components:      Result Value   Sodium 134 (*)  Glucose, Bld 101 (*)    Calcium 8.8 (*)    All other components within normal limits  CBC WITH DIFFERENTIAL/PLATELET - Abnormal; Notable for the following components:   RBC 3.84 (*)    All other components within normal limits  URINALYSIS, ROUTINE W REFLEX MICROSCOPIC - Abnormal; Notable for the following components:   Color, Urine  STRAW (*)    pH 8.5 (*)    All other components within normal limits  LIPASE, BLOOD    ____________________________________________   PROCEDURES  Procedure(s) performed:   Procedures  None ____________________________________________   INITIAL IMPRESSION / ASSESSMENT AND PLAN / ED COURSE  Pertinent labs & imaging results that were available during my care of the patient were reviewed by me and considered in my medical decision making (see chart for details).   This patient is Presenting for Evaluation of abdominal pain and dehydration, which does require a range of treatment options, and is a complaint that involves a high risk of morbidity and mortality.  The Differential Diagnoses include colitis, biliary dysfunction, gastritis, gastroparesis, viral gastritis.  Critical Interventions- IVF, IV pain medication, and IVF.    Medications  sodium chloride 0.9 % bolus 1,000 mL (0 mLs Intravenous Stopped 12/15/21 1504)  morphine (PF) 4 MG/ML injection 4 mg (4 mg Intravenous Given 12/15/21 1309)  ondansetron (ZOFRAN) injection 4 mg (4 mg Intravenous Given 12/15/21 1309)    Reassessment after intervention:  Patient feeling well.   I decided to review pertinent External Data, and in summary patient with admit at [facility] in January and ED visit in our system at that time as well. .   Clinical Laboratory Tests Ordered, included UA without evidence of infection. No AKI. Normal electrolytes. Normal lipase. Normal LFTs and bilirubin.   Radiologic Tests: Considered need for abdominal imaging. Patient with a CT in January in our system which was reviewed. This is a recurrent issue for her. Abdomen is soft and non-tender. Will hold on advanced imaging for now.   Cardiac Monitor Tracing which shows NSR.    Social Determinants of Health Risk non-smoker.   Medical Decision Making: Summary:  Patient presents to the ED with abdominal pain w/o tenderness on exam. Patient with normal vitals and  reassuring exam. Labs here are also reassuring. GI symptoms are chronic. Patient looking and feeling better after IVF and medication. Plan for continued GI follow up with [facility] team.   Reevaluation with update and discussion with patient. Plan for discharge with GI follow up.   Disposition: discharge.   ____________________________________________  FINAL CLINICAL IMPRESSION(S) / ED DIAGNOSES  Final diagnoses:  Nausea vomiting and diarrhea  Dehydration     Note:  This document was prepared using Dragon voice recognition software and may include unintentional dictation errors.  Nanda Quinton, MD, [facility] Emergency Medicine    Kaityln Kallstrom, [facility], MD 12/16/21 [phone]

## 2021-12-17 DIAGNOSIS — K529 Noninfective gastroenteritis and colitis, unspecified: Secondary | ICD-10-CM | POA: Diagnosis not present

## 2021-12-17 DIAGNOSIS — R110 Nausea: Secondary | ICD-10-CM | POA: Diagnosis not present

## 2021-12-18 DIAGNOSIS — F411 Generalized anxiety disorder: Secondary | ICD-10-CM | POA: Diagnosis not present

## 2021-12-18 DIAGNOSIS — Z79899 Other long term (current) drug therapy: Secondary | ICD-10-CM | POA: Diagnosis not present

## 2021-12-18 DIAGNOSIS — F329 Major depressive disorder, single episode, unspecified: Secondary | ICD-10-CM | POA: Diagnosis not present

## 2021-12-18 DIAGNOSIS — E860 Dehydration: Secondary | ICD-10-CM | POA: Diagnosis not present

## 2021-12-18 DIAGNOSIS — R1013 Epigastric pain: Secondary | ICD-10-CM | POA: Diagnosis not present

## 2021-12-18 DIAGNOSIS — D126 Benign neoplasm of colon, unspecified: Secondary | ICD-10-CM | POA: Diagnosis not present

## 2021-12-18 DIAGNOSIS — G4700 Insomnia, unspecified: Secondary | ICD-10-CM | POA: Diagnosis not present

## 2021-12-18 DIAGNOSIS — Z9884 Bariatric surgery status: Secondary | ICD-10-CM | POA: Diagnosis not present

## 2021-12-18 DIAGNOSIS — E46 Unspecified protein-calorie malnutrition: Secondary | ICD-10-CM | POA: Diagnosis not present

## 2021-12-18 DIAGNOSIS — G90A Postural orthostatic tachycardia syndrome (POTS): Secondary | ICD-10-CM | POA: Diagnosis not present

## 2021-12-18 DIAGNOSIS — J45909 Unspecified asthma, uncomplicated: Secondary | ICD-10-CM | POA: Diagnosis not present

## 2021-12-18 DIAGNOSIS — K551 Chronic vascular disorders of intestine: Secondary | ICD-10-CM | POA: Diagnosis not present

## 2021-12-18 DIAGNOSIS — R634 Abnormal weight loss: Secondary | ICD-10-CM | POA: Diagnosis not present

## 2021-12-18 DIAGNOSIS — N39490 Overflow incontinence: Secondary | ICD-10-CM | POA: Diagnosis not present

## 2021-12-18 DIAGNOSIS — D123 Benign neoplasm of transverse colon: Secondary | ICD-10-CM | POA: Diagnosis not present

## 2021-12-18 DIAGNOSIS — K3184 Gastroparesis: Secondary | ICD-10-CM | POA: Diagnosis not present

## 2021-12-18 DIAGNOSIS — R197 Diarrhea, unspecified: Secondary | ICD-10-CM | POA: Diagnosis not present

## 2021-12-18 DIAGNOSIS — Z4659 Encounter for fitting and adjustment of other gastrointestinal appliance and device: Secondary | ICD-10-CM | POA: Diagnosis not present

## 2021-12-18 DIAGNOSIS — Z6827 Body mass index (BMI) 27.0-27.9, adult: Secondary | ICD-10-CM | POA: Diagnosis not present

## 2021-12-18 DIAGNOSIS — R112 Nausea with vomiting, unspecified: Secondary | ICD-10-CM | POA: Diagnosis not present

## 2021-12-18 DIAGNOSIS — K529 Noninfective gastroenteritis and colitis, unspecified: Secondary | ICD-10-CM | POA: Diagnosis not present

## 2021-12-18 DIAGNOSIS — Z20822 Contact with and (suspected) exposure to covid-19: Secondary | ICD-10-CM | POA: Diagnosis not present

## 2021-12-18 DIAGNOSIS — Z90710 Acquired absence of both cervix and uterus: Secondary | ICD-10-CM | POA: Diagnosis not present

## 2021-12-25 DIAGNOSIS — R634 Abnormal weight loss: Secondary | ICD-10-CM | POA: Diagnosis not present

## 2021-12-25 DIAGNOSIS — R790 Abnormal level of blood mineral: Secondary | ICD-10-CM | POA: Diagnosis not present

## 2021-12-25 DIAGNOSIS — L03114 Cellulitis of left upper limb: Secondary | ICD-10-CM | POA: Diagnosis not present

## 2021-12-26 ENCOUNTER — Encounter (HOSPITAL_BASED_OUTPATIENT_CLINIC_OR_DEPARTMENT_OTHER): Payer: Self-pay

## 2021-12-26 ENCOUNTER — Emergency Department (HOSPITAL_BASED_OUTPATIENT_CLINIC_OR_DEPARTMENT_OTHER): Payer: BC Managed Care – PPO

## 2021-12-26 ENCOUNTER — Emergency Department (HOSPITAL_BASED_OUTPATIENT_CLINIC_OR_DEPARTMENT_OTHER)
Admission: EM | Admit: 2021-12-26 | Discharge: 2021-12-26 | Disposition: A | Discharge status: Home / Self Care | Payer: BC Managed Care – PPO | Attending: Emergency Medicine | Admitting: Emergency Medicine

## 2021-12-26 ENCOUNTER — Other Ambulatory Visit: Payer: Self-pay

## 2021-12-26 DIAGNOSIS — I808 Phlebitis and thrombophlebitis of other sites: Secondary | ICD-10-CM

## 2021-12-26 DIAGNOSIS — I82812 Embolism and thrombosis of superficial veins of left lower extremities: Secondary | ICD-10-CM | POA: Diagnosis not present

## 2021-12-26 DIAGNOSIS — I809 Phlebitis and thrombophlebitis of unspecified site: Secondary | ICD-10-CM | POA: Insufficient documentation

## 2021-12-26 DIAGNOSIS — M7989 Other specified soft tissue disorders: Secondary | ICD-10-CM | POA: Diagnosis not present

## 2021-12-26 LAB — CBC WITH DIFFERENTIAL/PLATELET
Abs Immature Granulocytes: 0.03 10*3/uL (ref 0.00–0.07)
Basophils Absolute: 0.1 10*3/uL (ref 0.0–0.1)
Basophils Relative: 1 %
Eosinophils Absolute: 0.1 10*3/uL (ref 0.0–0.5)
Eosinophils Relative: 1 %
HCT: 38.6 % (ref 36.0–46.0)
Hemoglobin: 12.8 g/dL (ref 12.0–15.0)
Immature Granulocytes: 0 %
Lymphocytes Relative: 30 %
Lymphs Abs: 2.1 10*3/uL (ref 0.7–4.0)
MCH: 31.3 pg (ref 26.0–34.0)
MCHC: 33.2 g/dL (ref 30.0–36.0)
MCV: 94.4 fL (ref 80.0–100.0)
Monocytes Absolute: 0.6 10*3/uL (ref 0.1–1.0)
Monocytes Relative: 8 %
Neutro Abs: 4.3 10*3/uL (ref 1.7–7.7)
Neutrophils Relative %: 60 %
Platelets: 215 10*3/uL (ref 150–400)
RBC: 4.09 MIL/uL (ref 3.87–5.11)
RDW: 13.2 % (ref 11.5–15.5)
WBC: 7.1 10*3/uL (ref 4.0–10.5)
nRBC: 0 % (ref 0.0–0.2)

## 2021-12-26 LAB — COMPREHENSIVE METABOLIC PANEL
ALT: 23 U/L (ref 0–44)
AST: 18 U/L (ref 15–41)
Albumin: 4.9 g/dL (ref 3.5–5.0)
Alkaline Phosphatase: 74 U/L (ref 38–126)
Anion gap: 10 (ref 5–15)
BUN: 6 mg/dL (ref 6–20)
CO2: 24 mmol/L (ref 22–32)
Calcium: 9.7 mg/dL (ref 8.9–10.3)
Chloride: 101 mmol/L (ref 98–111)
Creatinine, Ser: 0.7 mg/dL (ref 0.44–1.00)
GFR, Estimated: >60 mL/min (ref >60)
Glucose, Bld: 100 mg/dL — ABNORMAL HIGH (ref 70–99)
Potassium: 4.2 mmol/L (ref 3.5–5.1)
Sodium: 135 mmol/L (ref 135–145)
Total Bilirubin: 0.5 mg/dL (ref 0.3–1.2)
Total Protein: 7.7 g/dL (ref 6.5–8.1)

## 2021-12-26 LAB — LACTIC ACID, PLASMA: Lactic Acid, Venous: 0.7 mmol/L (ref 0.5–1.9)

## 2021-12-26 MED ORDER — SODIUM CHLORIDE 0.9 % IV SOLN
1.0000 g | Freq: Once | INTRAVENOUS | Status: AC
  Administered 2021-12-26: 1 g via INTRAVENOUS
  Filled 2021-12-26: qty 10

## 2021-12-26 NOTE — Discharge Instructions (Signed)
Continue that antibiotics given to you by your primary care provider   Use warm compresses on the area and use antiinflammatories like Ibuprofen or Naproxen to treat the pain and inflammation.  Please follow up with your primary care provider within 5-7 days for re-evaluation of your symptoms.   Please return to the emergency room immediately if you experience any new or worsening symptoms or any symptoms that indicate worsening infection such as fevers, increased redness/swelling/pain, warmth, or drainage from the affected area.

## 2021-12-26 NOTE — ED Triage Notes (Signed)
Patient here POV from Home with Skin Infection.  Patient was discharged 3 days PTA from [facility] Admission for Gastroparesis.   Patient endorses having a PIV during admission to Left Lateral Distal Forearm. On Saturday the Patient noticed Swelling, Redness, Tenderness to Same Area.  No Known Fevers. No New N/V/D. Sent by PCP for IV Antibiotics.   NAD Noted during Triage. A&Ox4. GCS 15. Ambulatory.

## 2021-12-26 NOTE — ED Provider Notes (Signed)
[facility] EMERGENCY DEPT Provider Note   CSN: [id number] Arrival date & time: 12/26/21  1132     History  Chief Complaint  Patient presents with   Arm Swelling    Kaitlyn Johnson is a 41 y.o. female.  HPI  41 year old female with a history of pancreatitis, celiac disease, Cushing syndrome, depression, nephrolithiasis, POTS syndrome sphincter of Oddi dysfunction, von Willebrand's disease, who presents to the emergency department today for evaluation of swelling and redness to the left upper extremity.  States she was admitted to an outside hospital last week for persistent vomiting.  She had an IV in the left forearm and since then has developed redness and firmness, pain and swelling to the arm.  She was seen by her PCP yesterday and was started on Keflex however symptoms have not improved since onset and she was told to come to the ED for IV antibiotics.  She reports some chills but no documented fevers at home.  She denies any systemic symptoms at this time.  Denies chest pain or shortness of breath.  Home Medications Prior to Admission medications   Medication Sig Start Date End Date Taking? Authorizing Provider  ABILIFY MAINTENA 400 MG SRER injection Inject 400 mg into the muscle every 28 (twenty-eight) days. 05/25/21   [provider]  acebutolol (SECTRAL) 200 MG capsule Take 1 capsule (200 mg total) by mouth 2 (two) times daily. 09/27/21   Cantwell, Celeste C, PA-C  acetaminophen (TYLENOL) 500 MG tablet Take 2 tablets (1,000 mg total) by mouth every 8 (eight) hours. 08/25/18   Meuth, Brooke A, PA-C  albuterol (VENTOLIN HFA) 108 (90 Base) MCG/ACT inhaler Inhale 1 puff into the lungs as needed. 01/20/15   [provider]  AMBIEN CR 12.5 MG CR tablet Take 12.5 mg by mouth at bedtime. 10/31/15   [provider]  busPIRone (BUSPAR) 10 MG tablet Take 20 mg by mouth 3 (three) times daily. 02/20/21   [provider]  clonazePAM  (KLONOPIN) 1 MG tablet Take 1 tablet (1 mg total) by mouth 2 (two) times daily as needed for anxiety. Patient taking differently: Take 1 mg by mouth daily as needed for anxiety. 12/07/13   Dorothea Ogle, MD  CYANOCOBALAMIN IJ Inject 1 mL as directed every 30 (thirty) days.    [provider]  DEXILANT 30 MG capsule Take 1 capsule by mouth 2 (two) times daily. 03/29/21   [provider]  diltiazem (CARDIZEM CD) 180 MG 24 hr capsule TAKE 1 CAPSULE DAILY. 06/19/21   Yates Decamp, MD  EPINEPHrine 0.3 mg/0.3 mL IJ SOAJ injection See admin instructions. 07/07/13   [provider]  influenza vac split quadrivalent PF (FLUARIX) 0.5 ML injection Inject into the muscle. 08/22/21   Judyann Munson, MD  lamoTRIgine (LAMICTAL) 25 MG tablet Take 1 tablet by mouth at bedtime. 09/18/21   [provider]  levalbuterol Pauline Aus HFA) 45 MCG/ACT inhaler Inhale into the lungs as needed.    [provider]  montelukast (SINGULAIR) 10 MG tablet Take 10 mg by mouth at bedtime.    [provider]  ondansetron (ZOFRAN-ODT) 8 MG disintegrating tablet 8mg  ODT q4 hours prn nausea 11/11/21   Geoffery Lyons, MD  oxyCODONE-acetaminophen (PERCOCET) 5-325 MG tablet Take 1-2 tablets by mouth every 6 (six) hours as needed. 11/11/21   Geoffery Lyons, MD  pantoprazole (PROTONIX) 40 MG tablet Take 1 tablet (40 mg total) by mouth 2 (two) times daily. 08/25/18   Meuth, Lina Sar, PA-C  prochlorperazine (COMPAZINE) 5 MG tablet Take 1 tablet (5 mg total) by mouth every 6 (six) hours as needed for refractory nausea / vomiting (if not controlled with zofran). 08/25/18   Meuth, Lina Sar, PA-C  promethazine (PHENERGAN) 25 MG tablet Take 25 mg by mouth every 6 (six) hours as needed. 11/28/20   [provider]  scopolamine (TRANSDERM-SCOP) 1 MG/3DAYS as needed.    [provider]  traZODone (DESYREL) 100 MG tablet Take 300 mg by mouth at bedtime.     [provider]       Allergies    Bee venom; Reglan [metoclopramide]; Shellfish allergy; Wheat bran; Adhesive [tape]; Hydrocod poli-chlorphe poli er; Hydromorphone hcl; Measles, mumps & rubella vac; Gluten meal; Sulfa antibiotics; Tartrazine; and Yellow dye    Review of Systems   Review of Systems See HPI for pertinent positives or negatives.   Physical Exam Updated Vital Signs BP 113/79    Pulse 80    Temp 98.1 F (36.7 C) (Oral)    Resp 16    Ht 5\' 9"  (1.753 m)    Wt 81.6 kg    SpO2 100%    BMI 26.58 kg/m  Physical Exam Vitals and nursing note reviewed.  Constitutional:      General: She is not in acute distress.    Appearance: She is well-developed.  HENT:     Head: Normocephalic and atraumatic.  Eyes:     Conjunctiva/sclera: Conjunctivae normal.  Cardiovascular:     Rate and Rhythm: Normal rate and regular rhythm.     Heart sounds: Normal heart sounds. No murmur heard. Pulmonary:     Effort: Pulmonary effort is normal. No respiratory distress.     Breath sounds: Normal breath sounds.  Abdominal:     Palpations: Abdomen is soft.     Tenderness: There is no abdominal tenderness.  Musculoskeletal:        General: Swelling and tenderness present.     Cervical back: Neck supple.     Comments: Induration and TTP to the left forearm at the site of prior IV site consistent with superficial thrombophlebitis. Radial pulse intact. Compartments are soft. No crepitus.  Skin:    General: Skin is warm and dry.     Capillary Refill: Capillary refill takes less than 2 seconds.  Neurological:     Mental Status: She is alert.  Psychiatric:        Mood and Affect: Mood normal.      ED Results / Procedures / Treatments   Labs (all labs ordered are listed, but only abnormal results are displayed) Labs Reviewed  COMPREHENSIVE METABOLIC PANEL - Abnormal; Notable for the following components:      Result Value   Glucose, Bld 100 (*)    All other components within normal limits  LACTIC ACID, PLASMA   CBC WITH DIFFERENTIAL/PLATELET    EKG None  Radiology Venous Img Upper Left (DVT Study)  Result Date: 12/26/2021 CLINICAL DATA:  Left upper extremity swelling. Suspected thrombophlebitis. Recent peripheral IV in the left upper extremity. EXAM: LEFT UPPER EXTREMITY VENOUS DOPPLER ULTRASOUND TECHNIQUE: Gray-scale sonography with graded compression, as well as color Doppler and duplex ultrasound were performed to evaluate the upper extremity deep venous system from the level of the subclavian vein and including the jugular, axillary, basilic, radial, ulnar and upper cephalic vein. Spectral Doppler was utilized to evaluate flow at rest and with distal augmentation maneuvers. COMPARISON:  None. FINDINGS: Contralateral Subclavian Vein: Normal color Doppler flow. No  evidence of thrombus. Internal Jugular Vein: No evidence of thrombus. Normal color Doppler flow and phasicity. Subclavian Vein: No evidence of thrombus. Normal color Doppler flow and phasicity. Axillary Vein: No evidence of thrombus. Normal compressibility, respiratory phasicity and response to augmentation. Cephalic Vein: Limited evaluation. There appears to be normal compressibility. Basilic Vein: No evidence of thrombus. Normal compressibility, respiratory phasicity and response to augmentation. Brachial Veins: No evidence of thrombus. Normal compressibility, respiratory phasicity and response to augmentation. Radial Veins: No evidence of thrombus. Normal compressibility and color Doppler flow. Ulnar Veins: No evidence of thrombus. Normal compressibility with color Doppler flow. Other Findings: Thrombosed superficial vein at the area of concern in the left lateral forearm. IMPRESSION: 1. Positive for superficial venous thrombosis in the left lateral forearm. 2. Negative for deep venous thrombosis in the left upper extremity. Electronically Signed   By: Richarda Overlie M.D.   On: 12/26/2021 14:14    Procedures Procedures    Medications Ordered  in ED Medications  cefTRIAXone (ROCEPHIN) 1 g in sodium chloride 0.9 % 100 mL IVPB (1 g Intravenous New Bag/Given 12/26/21 1310)    ED Course/ Medical Decision Making/ A&P                           Medical Decision Making Amount and/or Complexity of Data Reviewed Labs: ordered. ECG/medicine tests: ordered.   This patient presents to the ED for concern of arm swelling/pain, this involves an extensive number of treatment options, and is a complaint that carries with it a high risk of complications and morbidity.  The differential diagnosis includes but is not limited to cellulitis, superficial thrombophlebitis, DVT, necrotizing infection  Comorbidities that complicate the patient evaluation: Patients presentation is complicated by their history of pots, cushings syndrome, anemia  Additional history obtained: Records reviewed Care Everywhere/External Records  Lab Tests: I Ordered, and personally interpreted labs.  The pertinent results include:   CBC unremarkable CMP unremarkable Lactic acid neg  Imaging Studies ordered: I ordered imaging which showed  LUE [place] which showed superficial thrombophlebitis, no dvt  I agree with the radiologist interpretation   Medicines ordered and prescription drug management: I ordered medication including ceftriaxone, warm compresses  for tx of cellulitis and tx of superficial thrombophlebitis  Reevaluation of the patient after these medicines showed that the patient    stayed the same  Critical Interventions: abs, warm compresses  Complexity of problems addressed: Patients presentation is most consistent with  acute complicated illness/injury requiring diagnostic workup  Disposition: After consideration of the diagnostic results and the patients response to treatment,  I feel that the patent would benefit from discharge home with continued tx with abx.  . Discussed findings and plan. Advised pcp f/u and return to the ED if no improvement or  worsening sxs. Pt voices understanding of the plan and reasons to return. All questions answered. Pt stable for discharge.   Final Clinical Impression(s) / ED Diagnoses Final diagnoses:  Superficial thrombophlebitis of left upper extremity    Rx / DC Orders ED Discharge Orders     None         Karrie Meres, PA-C 12/26/21 1500    Terald Sleeper, MD 12/26/21 [phone]

## 2021-12-26 NOTE — ED Notes (Signed)
PA at Bedside. 

## 2021-12-27 DIAGNOSIS — R112 Nausea with vomiting, unspecified: Secondary | ICD-10-CM | POA: Diagnosis not present

## 2021-12-27 DIAGNOSIS — F419 Anxiety disorder, unspecified: Secondary | ICD-10-CM | POA: Diagnosis not present

## 2021-12-27 DIAGNOSIS — F339 Major depressive disorder, recurrent, unspecified: Secondary | ICD-10-CM | POA: Diagnosis not present

## 2021-12-27 DIAGNOSIS — R634 Abnormal weight loss: Secondary | ICD-10-CM | POA: Diagnosis not present

## 2021-12-27 DIAGNOSIS — R1013 Epigastric pain: Secondary | ICD-10-CM | POA: Diagnosis not present

## 2021-12-27 DIAGNOSIS — R197 Diarrhea, unspecified: Secondary | ICD-10-CM | POA: Diagnosis not present

## 2021-12-27 DIAGNOSIS — K3184 Gastroparesis: Secondary | ICD-10-CM | POA: Diagnosis not present

## 2021-12-27 DIAGNOSIS — R5383 Other fatigue: Secondary | ICD-10-CM | POA: Diagnosis not present

## 2021-12-27 DIAGNOSIS — N809 Endometriosis, unspecified: Secondary | ICD-10-CM | POA: Diagnosis not present

## 2021-12-27 DIAGNOSIS — Z01818 Encounter for other preprocedural examination: Secondary | ICD-10-CM | POA: Diagnosis not present

## 2021-12-27 DIAGNOSIS — R1010 Upper abdominal pain, unspecified: Secondary | ICD-10-CM | POA: Diagnosis not present

## 2021-12-27 DIAGNOSIS — G90A Postural orthostatic tachycardia syndrome (POTS): Secondary | ICD-10-CM | POA: Diagnosis not present

## 2021-12-27 DIAGNOSIS — J4520 Mild intermittent asthma, uncomplicated: Secondary | ICD-10-CM | POA: Diagnosis not present

## 2021-12-27 DIAGNOSIS — D6804 Acquired von Willebrand disease: Secondary | ICD-10-CM | POA: Diagnosis not present

## 2021-12-27 DIAGNOSIS — K551 Chronic vascular disorders of intestine: Secondary | ICD-10-CM | POA: Diagnosis not present

## 2021-12-28 DIAGNOSIS — K8689 Other specified diseases of pancreas: Secondary | ICD-10-CM | POA: Diagnosis not present

## 2021-12-28 DIAGNOSIS — K2289 Other specified disease of esophagus: Secondary | ICD-10-CM | POA: Diagnosis not present

## 2021-12-28 DIAGNOSIS — D6800 Von Willebrand disease, unspecified: Secondary | ICD-10-CM | POA: Diagnosis not present

## 2021-12-28 DIAGNOSIS — K900 Celiac disease: Secondary | ICD-10-CM | POA: Diagnosis not present

## 2021-12-28 DIAGNOSIS — R1084 Generalized abdominal pain: Secondary | ICD-10-CM | POA: Diagnosis not present

## 2021-12-28 DIAGNOSIS — Q453 Other congenital malformations of pancreas and pancreatic duct: Secondary | ICD-10-CM | POA: Diagnosis not present

## 2021-12-28 DIAGNOSIS — Z980 Intestinal bypass and anastomosis status: Secondary | ICD-10-CM | POA: Diagnosis not present

## 2021-12-28 DIAGNOSIS — J45909 Unspecified asthma, uncomplicated: Secondary | ICD-10-CM | POA: Diagnosis not present

## 2021-12-28 DIAGNOSIS — Z9884 Bariatric surgery status: Secondary | ICD-10-CM | POA: Diagnosis not present

## 2021-12-28 DIAGNOSIS — K859 Acute pancreatitis without necrosis or infection, unspecified: Secondary | ICD-10-CM | POA: Diagnosis not present

## 2021-12-28 DIAGNOSIS — Z79899 Other long term (current) drug therapy: Secondary | ICD-10-CM | POA: Diagnosis not present

## 2022-01-01 ENCOUNTER — Encounter: Payer: Self-pay | Admitting: Cardiology

## 2022-01-01 NOTE — Telephone Encounter (Signed)
Please let patient know this is like related to volume depletion. I reviewed her records, it looks like diarrhea had gotten better prior to discharge.  When is her next follow-up with GI?  I see she is not scheduled to see Dr. Einar Gip until June, I am happy to see her sooner although I suspect symptoms are indeed related to hypovolemia given ongoing diarrhea.

## 2022-01-01 NOTE — Telephone Encounter (Signed)
From patient.

## 2022-01-02 NOTE — Telephone Encounter (Signed)
From patient.

## 2022-01-19 DIAGNOSIS — R112 Nausea with vomiting, unspecified: Secondary | ICD-10-CM | POA: Diagnosis not present

## 2022-01-19 DIAGNOSIS — R1013 Epigastric pain: Secondary | ICD-10-CM | POA: Diagnosis not present

## 2022-01-19 DIAGNOSIS — K3184 Gastroparesis: Secondary | ICD-10-CM | POA: Diagnosis not present

## 2022-01-31 IMAGING — MR MR HEAD WO/W CM
12 series · 48 of 48 positions shown · IV contrast (multihance)
Comparison: Prior head CT examinations 09/04/2021 and earlier.

CLINICAL DATA: Provided history: Persistent headaches. Additional
history provided by scanning technologist: Patient reports constant
headaches (right-sided) since 09/04/2021.

EXAM:
MRI HEAD WITHOUT AND WITH CONTRAST
MRA HEAD WITHOUT CONTRAST
TECHNIQUE: Multiplanar, multi-echo pulse sequences of the brain and surrounding
structures were acquired without and with intravenous contrast.
Angiographic images of the Circle of Willis were acquired using MRA
technique without intravenous contrast.
CONTRAST:  20mL MULTIHANCE GADOBENATE DIMEGLUMINE 529 MG/ML IV SOLN

[Series 1: T1 · sagittal · 5.0mm · 0.45mm/px · 1 of 26 slices shown]
[im 1/26]
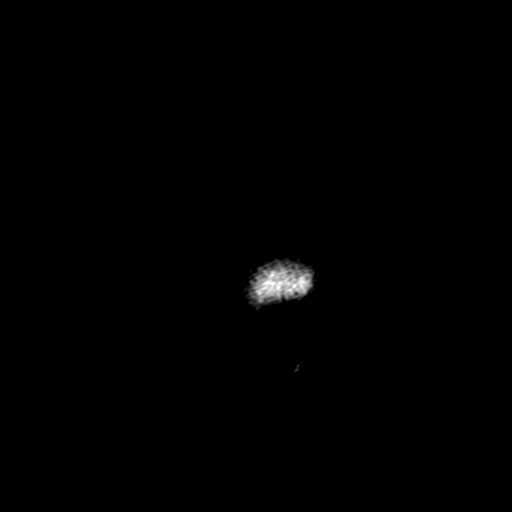

[Series 2: ax ep2d_diff_3 · axial · 3.0mm · 1.80mm/px · z∈[-98,+63]mm · 6 of 105 slices shown]
[im 1/105]
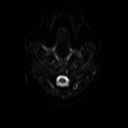
[im 21/105]
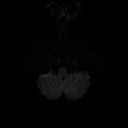
[im 42/105]
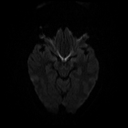
[im 63/105]
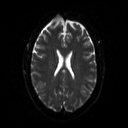
[im 84/105]
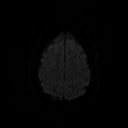
[im 105/105]
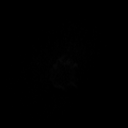

[Series 3: ax ep2d_diff_3_adc · axial · 3.0mm · 1.80mm/px · z∈[-98,+63]mm · 3 of 55 slices shown]
[im 1/55]
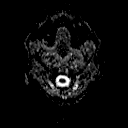
[im 28/55]
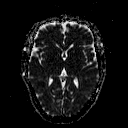
[im 55/55]
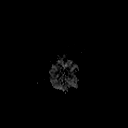

[Series 4: cor ep2d_diff · coronal · 5.0mm · 1.77mm/px · 3 of 59 slices shown]
[im 1/59]
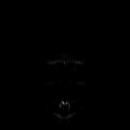
[im 30/59]
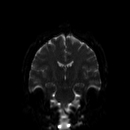
[im 59/59]
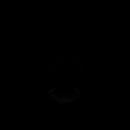

[Series 5: cor ep2d_diff_adc · coronal · 5.0mm · 1.77mm/px · 2 of 30 slices shown]
[im 1/30]
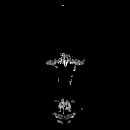
[im 30/30]
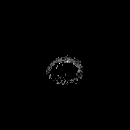

[Series 7: swi_images · axial · 2.0mm · 0.98mm/px · z∈[-95,+62]mm · 5 of 80 slices shown]
[im 1/80]
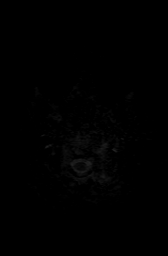
[im 20/80]
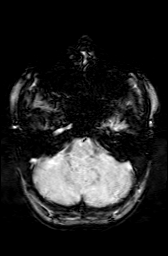
[im 40/80]
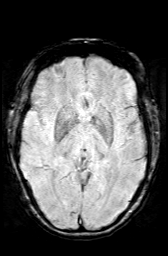
[im 60/80]
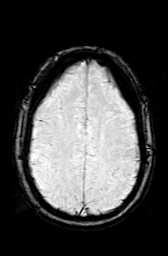
[im 80/80]
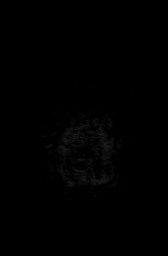

[Series 8: FLAIR · axial · 3.0mm · 0.43mm/px · z∈[-98,+61]mm · 2 of 42 slices shown]
[im 1/42]
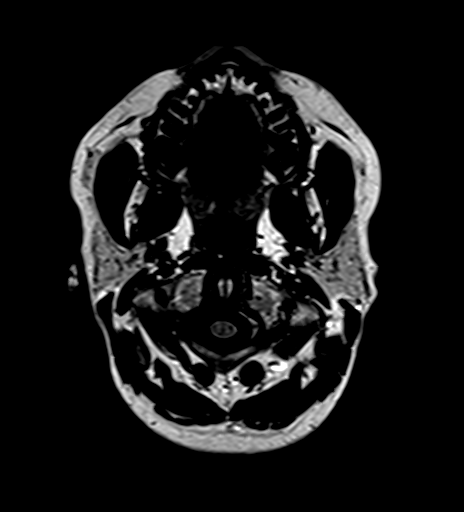
[im 42/42]
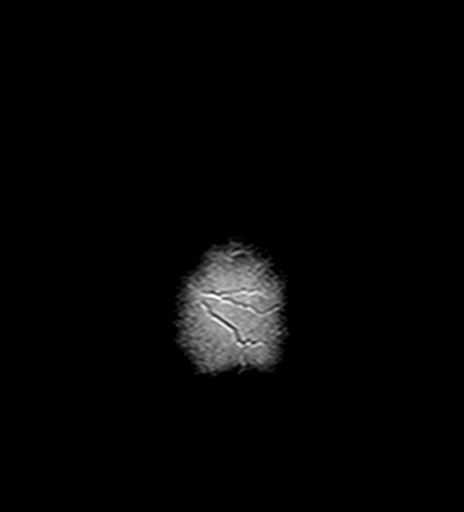

[Series 9: T2 · axial · 5.0mm · 0.65mm/px · z∈[-100,+67]mm · 2 of 29 slices shown (1 of 2)]
[im 1/29]
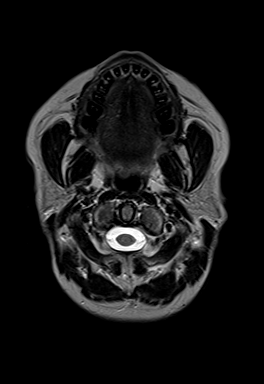
[im 29/29]
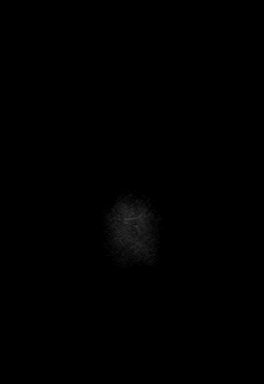

[Series 10: t1_mpr_tra · axial · 1.0mm · 0.72mm/px · z∈[-105,+69]mm · 10 of 176 slices shown]
[im 1/176]
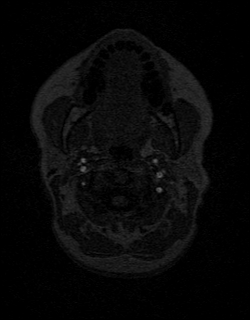
[im 20/176]
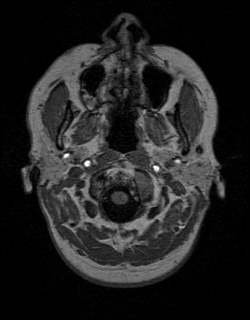
[im 39/176]
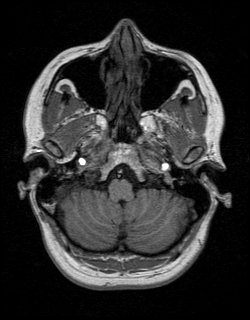
[im 59/176]
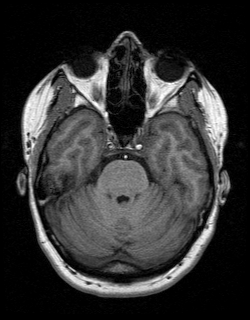
[im 78/176]
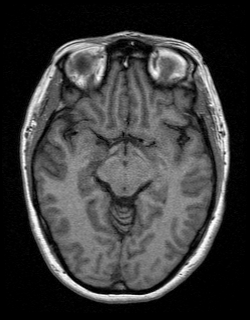
[im 98/176]
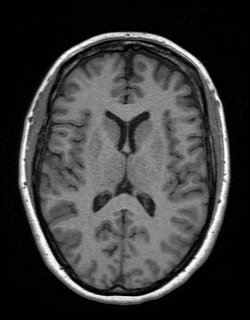
[im 117/176]
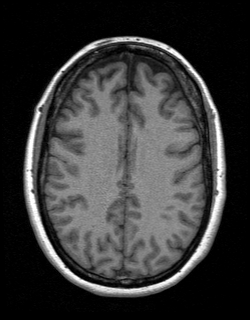
[im 137/176]
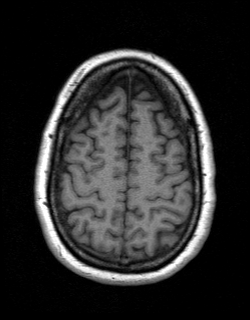
[im 156/176]
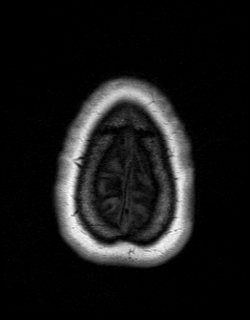
[im 176/176]
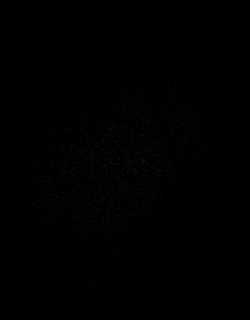

[Series 11: T2 · coronal · 5.0mm · 0.43mm/px · 2 of 32 slices shown (2 of 2)]
[im 1/32]
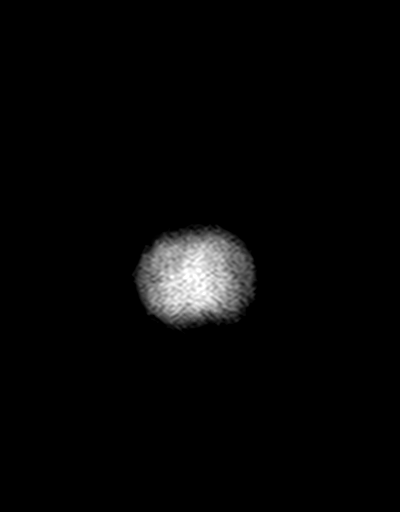
[im 32/32]
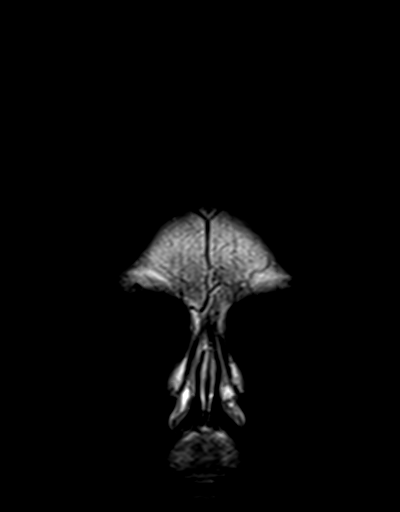

[Series 12: post t1_mpr_tra · axial · 1.0mm · 0.72mm/px · z∈[-105,+69]mm · 10 of 176 slices shown]
[im 1/176]
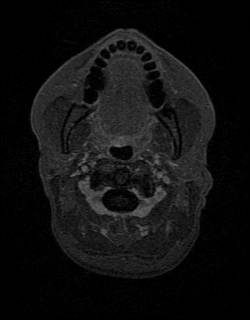
[im 20/176]
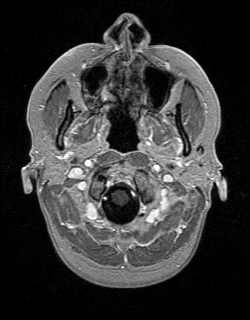
[im 39/176]
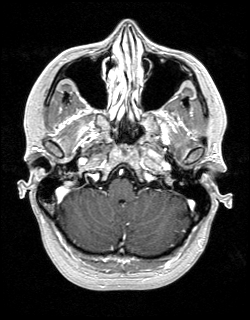
[im 59/176]
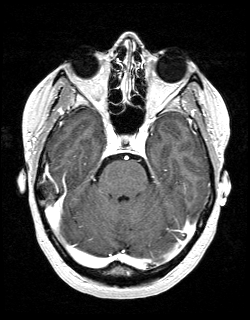
[im 78/176]
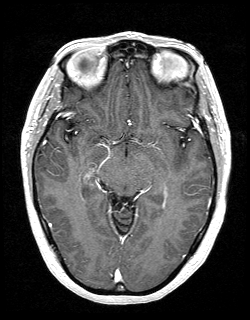
[im 98/176]
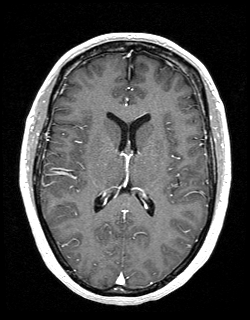
[im 117/176]
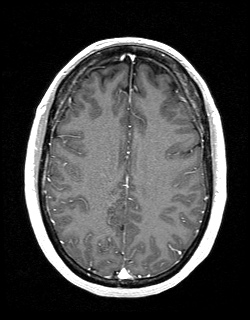
[im 137/176]
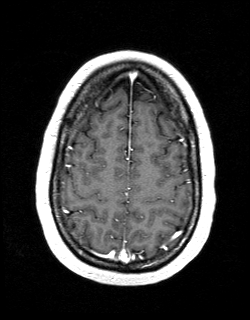
[im 156/176]
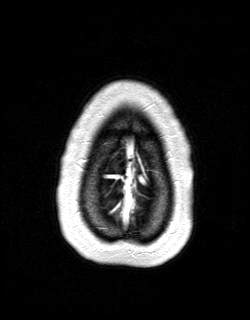
[im 176/176]
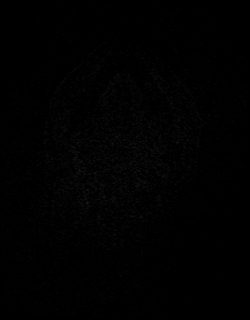

[Series 13: T1 post-contrast · coronal · 5.0mm · 0.72mm/px · 2 of 32 slices shown]
[im 1/32]
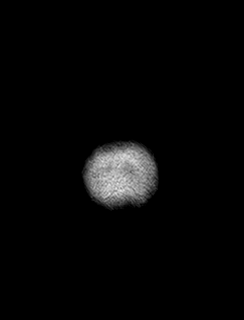
[im 32/32]
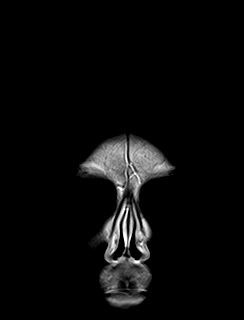

[48 of 48 positions shown; findings below may reference images not displayed]

FINDINGS: MRI HEAD FINDINGS

Brain:

Cerebral volume is normal.

No cortical encephalomalacia is identified.

There is no acute infarct.

No evidence of an intracranial mass.

No chronic intracranial blood products.

No extra-axial fluid collection.

No midline shift.

No pathologic intracranial enhancement identified.

Vascular: Maintained flow voids within the proximal large arterial
vessels.

Skull and upper cervical spine: No focal suspicious marrow lesion.

Sinuses/Orbits: Visualized orbits show no acute finding. Minimal
mucosal thickening within the bilateral ethmoid sinuses.

MRA HEAD FINDINGS

Anterior circulation:

The intracranial internal carotid arteries are patent. The M1 middle
cerebral arteries are patent. No M2 proximal branch occlusion or
high-grade proximal stenosis is identified. The anterior cerebral
arteries are patent. No intracranial aneurysm is identified.

Posterior circulation:

The intracranial vertebral arteries are patent. The basilar artery
is patent. The posterior cerebral arteries are patent. Posterior
communicating arteries are present bilaterally.

Anatomic variants: None significant.
IMPRESSION: MRI brain:

1. Unremarkable MRI appearance of the brain. No evidence of acute
intracranial abnormality.
2. Minimal mucosal thickening within the bilateral ethmoid sinuses.

MRA head:

1. Unremarkable exam. No intracranial large vessel occlusion or
proximal high-grade arterial stenosis.
2. No intracranial aneurysm is identified.

## 2022-02-01 ENCOUNTER — Encounter (HOSPITAL_COMMUNITY): Payer: Self-pay | Admitting: *Deleted

## 2022-02-06 DIAGNOSIS — F331 Major depressive disorder, recurrent, moderate: Secondary | ICD-10-CM | POA: Diagnosis not present

## 2022-02-06 DIAGNOSIS — F605 Obsessive-compulsive personality disorder: Secondary | ICD-10-CM | POA: Diagnosis not present

## 2022-02-06 DIAGNOSIS — F411 Generalized anxiety disorder: Secondary | ICD-10-CM | POA: Diagnosis not present

## 2022-02-19 DIAGNOSIS — R112 Nausea with vomiting, unspecified: Secondary | ICD-10-CM | POA: Diagnosis not present

## 2022-02-19 DIAGNOSIS — K3184 Gastroparesis: Secondary | ICD-10-CM | POA: Diagnosis not present

## 2022-02-19 DIAGNOSIS — R1013 Epigastric pain: Secondary | ICD-10-CM | POA: Diagnosis not present

## 2022-03-16 DIAGNOSIS — J4520 Mild intermittent asthma, uncomplicated: Secondary | ICD-10-CM | POA: Insufficient documentation

## 2022-03-16 DIAGNOSIS — K3184 Gastroparesis: Secondary | ICD-10-CM | POA: Diagnosis present

## 2022-03-16 DIAGNOSIS — D509 Iron deficiency anemia, unspecified: Secondary | ICD-10-CM | POA: Insufficient documentation

## 2022-03-16 DIAGNOSIS — T63441A Toxic effect of venom of bees, accidental (unintentional), initial encounter: Secondary | ICD-10-CM | POA: Insufficient documentation

## 2022-03-16 DIAGNOSIS — G8929 Other chronic pain: Secondary | ICD-10-CM | POA: Insufficient documentation

## 2022-03-16 DIAGNOSIS — Z8619 Personal history of other infectious and parasitic diseases: Secondary | ICD-10-CM | POA: Insufficient documentation

## 2022-03-16 DIAGNOSIS — G4700 Insomnia, unspecified: Secondary | ICD-10-CM | POA: Diagnosis present

## 2022-03-16 DIAGNOSIS — Z8719 Personal history of other diseases of the digestive system: Secondary | ICD-10-CM | POA: Insufficient documentation

## 2022-03-16 DIAGNOSIS — Z90710 Acquired absence of both cervix and uterus: Secondary | ICD-10-CM | POA: Insufficient documentation

## 2022-03-16 DIAGNOSIS — Z9884 Bariatric surgery status: Secondary | ICD-10-CM | POA: Diagnosis not present

## 2022-04-25 ENCOUNTER — Ambulatory Visit: Payer: BC Managed Care – PPO | Admitting: Cardiology

## 2022-05-03 DIAGNOSIS — F411 Generalized anxiety disorder: Secondary | ICD-10-CM | POA: Diagnosis not present

## 2022-05-03 DIAGNOSIS — F605 Obsessive-compulsive personality disorder: Secondary | ICD-10-CM | POA: Diagnosis not present

## 2022-05-03 DIAGNOSIS — F331 Major depressive disorder, recurrent, moderate: Secondary | ICD-10-CM | POA: Diagnosis not present

## 2022-05-14 ENCOUNTER — Other Ambulatory Visit: Payer: Self-pay

## 2022-05-14 ENCOUNTER — Emergency Department (HOSPITAL_COMMUNITY)
Admission: EM | Admit: 2022-05-14 | Discharge: 2022-05-14 | Disposition: A | Discharge status: Home / Self Care | Payer: BC Managed Care – PPO | Attending: Emergency Medicine | Admitting: Emergency Medicine

## 2022-05-14 ENCOUNTER — Encounter (HOSPITAL_COMMUNITY): Payer: Self-pay

## 2022-05-14 ENCOUNTER — Emergency Department (HOSPITAL_COMMUNITY): Payer: BC Managed Care – PPO

## 2022-05-14 DIAGNOSIS — M5440 Lumbago with sciatica, unspecified side: Secondary | ICD-10-CM | POA: Diagnosis not present

## 2022-05-14 DIAGNOSIS — S39012A Strain of muscle, fascia and tendon of lower back, initial encounter: Secondary | ICD-10-CM | POA: Insufficient documentation

## 2022-05-14 DIAGNOSIS — K6289 Other specified diseases of anus and rectum: Secondary | ICD-10-CM | POA: Insufficient documentation

## 2022-05-14 DIAGNOSIS — M549 Dorsalgia, unspecified: Secondary | ICD-10-CM | POA: Diagnosis not present

## 2022-05-14 DIAGNOSIS — X58XXXA Exposure to other specified factors, initial encounter: Secondary | ICD-10-CM | POA: Insufficient documentation

## 2022-05-14 DIAGNOSIS — R198 Other specified symptoms and signs involving the digestive system and abdomen: Secondary | ICD-10-CM

## 2022-05-14 DIAGNOSIS — Z6828 Body mass index (BMI) 28.0-28.9, adult: Secondary | ICD-10-CM | POA: Diagnosis not present

## 2022-05-14 DIAGNOSIS — S3992XA Unspecified injury of lower back, initial encounter: Secondary | ICD-10-CM | POA: Diagnosis not present

## 2022-05-14 DIAGNOSIS — R112 Nausea with vomiting, unspecified: Secondary | ICD-10-CM | POA: Diagnosis not present

## 2022-05-14 DIAGNOSIS — M545 Low back pain, unspecified: Secondary | ICD-10-CM | POA: Diagnosis not present

## 2022-05-14 DIAGNOSIS — R197 Diarrhea, unspecified: Secondary | ICD-10-CM | POA: Diagnosis not present

## 2022-05-14 DIAGNOSIS — R200 Anesthesia of skin: Secondary | ICD-10-CM | POA: Diagnosis not present

## 2022-05-14 LAB — BASIC METABOLIC PANEL
Anion gap: 10 (ref 5–15)
BUN: 7 mg/dL (ref 6–20)
CO2: 24 mmol/L (ref 22–32)
Calcium: 9.1 mg/dL (ref 8.9–10.3)
Chloride: 104 mmol/L (ref 98–111)
Creatinine, Ser: 0.78 mg/dL (ref 0.44–1.00)
GFR, Estimated: >60 mL/min (ref >60)
Glucose, Bld: 96 mg/dL (ref 70–99)
Potassium: 3.7 mmol/L (ref 3.5–5.1)
Sodium: 138 mmol/L (ref 135–145)

## 2022-05-14 LAB — CBC WITH DIFFERENTIAL/PLATELET
Abs Immature Granulocytes: 0.02 10*3/uL (ref 0.00–0.07)
Basophils Absolute: 0 10*3/uL (ref 0.0–0.1)
Basophils Relative: 1 %
Eosinophils Absolute: 0.1 10*3/uL (ref 0.0–0.5)
Eosinophils Relative: 1 %
HCT: 37.8 % (ref 36.0–46.0)
Hemoglobin: 12.8 g/dL (ref 12.0–15.0)
Immature Granulocytes: 0 %
Lymphocytes Relative: 28 %
Lymphs Abs: 1.8 10*3/uL (ref 0.7–4.0)
MCH: 33.1 pg (ref 26.0–34.0)
MCHC: 33.9 g/dL (ref 30.0–36.0)
MCV: 97.7 fL (ref 80.0–100.0)
Monocytes Absolute: 0.4 10*3/uL (ref 0.1–1.0)
Monocytes Relative: 7 %
Neutro Abs: 4 10*3/uL (ref 1.7–7.7)
Neutrophils Relative %: 63 %
Platelets: 223 10*3/uL (ref 150–400)
RBC: 3.87 MIL/uL (ref 3.87–5.11)
RDW: 12.7 % (ref 11.5–15.5)
WBC: 6.4 10*3/uL (ref 4.0–10.5)
nRBC: 0 % (ref 0.0–0.2)

## 2022-05-14 MED ORDER — SODIUM CHLORIDE 0.9 % IV SOLN: INTRAVENOUS | Status: DC | PRN

## 2022-05-14 MED ORDER — MORPHINE SULFATE (PF) 4 MG/ML IV SOLN
4.0000 mg | Freq: Once | INTRAVENOUS | Status: AC
  Administered 2022-05-14: 4 mg via INTRAVENOUS
  Filled 2022-05-14: qty 1

## 2022-05-14 MED ORDER — KETOROLAC TROMETHAMINE 15 MG/ML IJ SOLN
15.0000 mg | Freq: Once | INTRAMUSCULAR | Status: AC
  Administered 2022-05-14: 15 mg via INTRAVENOUS
  Filled 2022-05-14: qty 1

## 2022-05-14 MED ORDER — LORAZEPAM 2 MG/ML IJ SOLN
1.0000 mg | Freq: Once | INTRAMUSCULAR | Status: AC
  Administered 2022-05-14: 1 mg via INTRAVENOUS
  Filled 2022-05-14 (×2): qty 1

## 2022-05-14 NOTE — ED Notes (Signed)
Patient transported to MRI 

## 2022-05-14 NOTE — ED Provider Notes (Signed)
[facility] EMERGENCY DEPARTMENT Provider Note   CSN: [id number] Arrival date & time: 05/14/22  1607     History  Chief Complaint  Patient presents with   Back Pain    w/ Rectal Pressure    Kaitlyn Johnson is a 41 y.o. female.  Patient presents with persistent lumbar back pain and now feeling rectal pressure and more relaxed tone during bowel movements.  Patient has numbness down right leg and mild pain as well.  Patient's had cervical and lumbar disc surgeries performed by local neurosurgeon.  Patient was seen by neurosurgery today due to persistent and worsening symptoms and sent here for urgent MRI.  Patient denies any fevers or chills.  No direct trauma the past few days.  Patient has had anterior cervical discectomy, lumbar laminectomy, abdominal hysterectomy, laparoscopic appendectomy and gastric sleeve.       Home Medications Prior to Admission medications   Medication Sig Start Date End Date Taking? Authorizing Provider  ABILIFY MAINTENA 400 MG SRER injection Inject 400 mg into the muscle every 28 (twenty-eight) days. 05/25/21   [provider]  acebutolol (SECTRAL) 200 MG capsule Take 1 capsule (200 mg total) by mouth 2 (two) times daily. 09/27/21   Cantwell, Celeste C, PA-C  acetaminophen (TYLENOL) 500 MG tablet Take 2 tablets (1,000 mg total) by mouth every 8 (eight) hours. 08/25/18   Meuth, Brooke A, PA-C  albuterol (VENTOLIN HFA) 108 (90 Base) MCG/ACT inhaler Inhale 1 puff into the lungs as needed. 01/20/15   [provider]  AMBIEN CR 12.5 MG CR tablet Take 12.5 mg by mouth at bedtime. 10/31/15   [provider]  busPIRone (BUSPAR) 10 MG tablet Take 20 mg by mouth 3 (three) times daily. 02/20/21   [provider]  clonazePAM (KLONOPIN) 1 MG tablet Take 1 tablet (1 mg total) by mouth 2 (two) times daily as needed for anxiety. Patient taking differently: Take 1 mg by mouth daily as needed for anxiety. 12/07/13    Theodis Blaze, MD  CYANOCOBALAMIN IJ Inject 1 mL as directed every 30 (thirty) days.    [provider]  DEXILANT 30 MG capsule Take 1 capsule by mouth 2 (two) times daily. 03/29/21   [provider]  diltiazem (CARDIZEM CD) 180 MG 24 hr capsule TAKE 1 CAPSULE DAILY. 06/19/21   Adrian Prows, MD  EPINEPHrine 0.3 mg/0.3 mL IJ SOAJ injection See admin instructions. 07/07/13   [provider]  influenza vac split quadrivalent PF (FLUARIX) 0.5 ML injection Inject into the muscle. 08/22/21   Carlyle Basques, MD  lamoTRIgine (LAMICTAL) 25 MG tablet Take 1 tablet by mouth at bedtime. 09/18/21   [provider]  levalbuterol Penne Lash HFA) 45 MCG/ACT inhaler Inhale into the lungs as needed.    [provider]  montelukast (SINGULAIR) 10 MG tablet Take 10 mg by mouth at bedtime.    [provider]  ondansetron (ZOFRAN-ODT) 8 MG disintegrating tablet 44m ODT q4 hours prn nausea 11/11/21   DVeryl Speak MD  oxyCODONE-acetaminophen (PERCOCET) 5-325 MG tablet Take 1-2 tablets by mouth every 6 (six) hours as needed. 11/11/21   DVeryl Speak MD  pantoprazole (PROTONIX) 40 MG tablet Take 1 tablet (40 mg total) by mouth 2 (two) times daily. 08/25/18   Meuth, Brooke A, PA-C  prochlorperazine (COMPAZINE) 5 MG tablet Take 1 tablet (5 mg total) by mouth every 6 (six) hours as needed for refractory nausea / vomiting (if not controlled with zofran). 08/25/18  Meuth, Brooke A, PA-C  promethazine (PHENERGAN) 25 MG tablet Take 25 mg by mouth every 6 (six) hours as needed. 11/28/20   [provider]  scopolamine (TRANSDERM-SCOP) 1 MG/3DAYS as needed.    [provider]  traZODone (DESYREL) 100 MG tablet Take 300 mg by mouth at bedtime.     [provider]      Allergies    Bee venom; Reglan [metoclopramide]; Shellfish allergy; Wheat bran; Adhesive [tape]; Hydrocod poli-chlorphe poli er; Measles, mumps & rubella vac; Gluten meal; Hydromorphone hcl;  Sulfa antibiotics; Tartrazine; and Yellow dye    Review of Systems   Review of Systems  Constitutional:  Negative for chills and fever.  HENT:  Negative for congestion.   Eyes:  Negative for visual disturbance.  Respiratory:  Negative for shortness of breath.   Cardiovascular:  Negative for chest pain.  Gastrointestinal:  Negative for abdominal pain and vomiting.  Genitourinary:  Negative for dysuria and flank pain.  Musculoskeletal:  Positive for back pain. Negative for neck pain and neck stiffness.  Skin:  Negative for rash.  Neurological:  Negative for light-headedness and headaches.    Physical Exam Updated Vital Signs BP (!) 120/91 (BP Location: Right Arm)   Pulse 97   Temp 98.4 F (36.9 C) (Oral)   Resp 18   Ht 5' 9"  (1.753 m)   Wt 86.2 kg   SpO2 98%   BMI 28.06 kg/m  Physical Exam Vitals and nursing note reviewed.  Constitutional:      General: She is not in acute distress.    Appearance: She is well-developed.  HENT:     Head: Normocephalic.     Mouth/Throat:     Mouth: Mucous membranes are moist.  Eyes:     General:        Right eye: No discharge.        Left eye: No discharge.  Neck:     Trachea: No tracheal deviation.  Cardiovascular:     Rate and Rhythm: Normal rate.  Pulmonary:     Effort: Pulmonary effort is normal.  Abdominal:     General: There is no distension.     Palpations: Abdomen is soft.     Tenderness: There is no abdominal tenderness. There is no guarding.  Musculoskeletal:        General: Tenderness present. No swelling.     Cervical back: Normal range of motion.     Comments: Patient has tenderness midline paraspinal lower lumbar region, no midline thoracic tenderness.  Patient has 5+ strength with flexion extension of lower extremities at major joints, more difficult time with right lower extremity due to pain especially with straight leg raise.  Sensation intact in all major nerves to palpation.  2+ reflexes lower extremities  bilateral.  Skin:    General: Skin is warm.     Capillary Refill: Capillary refill takes less than 2 seconds.     Findings: No rash.  Neurological:     General: No focal deficit present.     Mental Status: She is alert.     Cranial Nerves: No cranial nerve deficit.     Sensory: No sensory deficit.     Motor: No weakness.     Coordination: Coordination normal.  Psychiatric:        Mood and Affect: Mood normal.     ED Results / Procedures / Treatments   Labs (all labs ordered are listed, but only abnormal results are displayed) Labs Reviewed  CBC  WITH DIFFERENTIAL/PLATELET  BASIC METABOLIC PANEL    EKG None  Radiology MR THORACIC SPINE WO CONTRAST  Result Date: 05/14/2022 CLINICAL DATA:  Back pain.  Rectal pressure and numbness. EXAM: MRI THORACIC SPINE WITHOUT CONTRAST TECHNIQUE: Multiplanar, multisequence MR imaging of the thoracic spine was performed. No intravenous contrast was administered. COMPARISON:  Thoracic radiography 10/17/2016. Cervical MRI 12/27/2020. FINDINGS: Alignment: Mild scoliotic curvature convex to the right. No antero or retrolisthesis. Vertebrae: No thoracic fracture or focal bone lesion. Cord:  No cord compression or focal cord lesion. Paraspinal and other soft tissues: Negative Disc levels: No significant disc finding at T5-6 or above. At T6-7, there is a left posterolateral disc herniation with slight indentation of the subarachnoid space but no compression of cord. No compressive foraminal narrowing. T7-8: Small central disc protrusion indents the subarachnoid space but does not affect the cord or show foraminal extension. T8-9, T9-10 and T10-11 are normal. T11-12: Shallow central to left-sided disc herniation as shown at lumbar imaging. No apparent neural compression. IMPRESSION: No thoracic abnormality likely to cause the clinical presentation. Mild scoliotic curvature convex to the right. Small left posterolateral disc herniation at T6-7 but without cord  compression or apparent foraminal encroachment. Minimal central disc protrusion at T7-8 without neural compression. Shallow central to left-sided disc herniation at T11-12 without neural compression. Electronically Signed   By: Nelson Chimes M.D.   On: 05/14/2022 21:35   MR LUMBAR SPINE WO CONTRAST  Result Date: 05/14/2022 CLINICAL DATA:  Low back pain.  Cauda equina syndrome suspected. EXAM: MRI LUMBAR SPINE WITHOUT CONTRAST TECHNIQUE: Multiplanar, multisequence MR imaging of the lumbar spine was performed. No intravenous contrast was administered. COMPARISON:  05/29/2021 FINDINGS: Segmentation: Same numbering terminology utilized as on the prior exam. L5 is sacralized. Alignment:  Normal Vertebrae:  No fracture or focal bone lesion. Conus medullaris and cauda equina: Conus extends to the T12-L1 level. Conus and cauda equina appear normal. Paraspinal and other soft tissues: Negative Disc levels: Shallow central to left-sided disc protrusion at T12-L1 but without visible neural compression or change. L1-2: Normal interspace. L2-3: Minimal disc bulge. Minimal facet hypertrophy. No stenosis. No change. L3-4: Minimal disc bulge with an annular fissure in the right foraminal to extraforaminal region. No visible neural compression. Annular fissures can be associated with nerve irritation. L4-5: Previous left hemilaminectomy. Endplate osteophytes and mild bulging of the disc. No compressive narrowing of the canal or foramina. L5-S1: Vestigial disc. No stenosis of the canal or foramina. No edematous change seen at the transitional articulation on right. IMPRESSION: L5 is sacralized, as described previously. Shallow central to left-sided disc herniation at T12-L1 without change or apparent neural compression. Right foraminal annular fissure at L3-4 without evidence of neural compression. Annular fissures can be associated with neural irritation. Previous left laminectomy and discectomy. Endplate osteophytes and mild  bulging of the disc but no apparent neural compressive stenosis or change. Electronically Signed   By: Nelson Chimes M.D.   On: 05/14/2022 21:29    Procedures Procedures    Medications Ordered in ED Medications  0.9 %  sodium chloride infusion ( Intravenous New Bag/Given 05/14/22 1952)  morphine (PF) 4 MG/ML injection 4 mg (4 mg Intravenous Given 05/14/22 1725)  LORazepam (ATIVAN) injection 1 mg (1 mg Intravenous Given 05/14/22 1959)  morphine (PF) 4 MG/ML injection 4 mg (4 mg Intravenous Given 05/14/22 1953)  morphine (PF) 4 MG/ML injection 4 mg (4 mg Intravenous Given 05/14/22 2132)  ketorolac (TORADOL) 15 MG/ML injection 15 mg (15 mg  Intravenous Given 05/14/22 2227)    ED Course/ Medical Decision Making/ A&P                           Medical Decision Making Amount and/or Complexity of Data Reviewed Labs: ordered. Radiology: ordered.  Risk Prescription drug management.   Patient presents from neurosurgery office for urgent MRI due to worsening pain and change in bowel movements.  MRI and lumbar and thoracic ordered without contrast.  Pain meds ordered and Ativan as needed for claustrophobia.  General blood work sent.  Differential includes cauda equina, musculoskeletal, disc herniation, ligamentous, other.  No concern for infectious component at this time.  General blood work ordered reviewed normal white count, normal hemoglobin, electrolytes unremarkable kidney function normal.  Pain worsening while waiting for MRI, repeat morphine dose ordered.  Called MRI to arrange.  MRI performed and results independently reviewed and discussed with neurosurgery on-call and fortunately no significant findings.  Overall similar to previous.  Mild disc herniation at 3 different levels but nothing to explain her change in signs and symptoms.  Neurosurgery will ensure close follow-up outpatient.  Discussed this with patient and discharged after Toradol.        Final Clinical Impression(s) / ED  Diagnoses Final diagnoses:  Lumbar strain, initial encounter  Rectal pressure    Rx / DC Orders ED Discharge Orders     None         Elnora Morrison, MD 05/14/22 2253

## 2022-05-14 NOTE — ED Notes (Signed)
Discharge papers discussed with pt caregiver. Discussed s/sx to return, follow up with PCP, medications given/next dose due. Caregiver verbalized understanding.  ?

## 2022-05-14 NOTE — ED Triage Notes (Addendum)
Pt heard a pop in the back when getting up from the couch last week. Pain has been gradually increasing, but has been manageable with ibuprofen. Pt said pain was even worse this morning and is now feeling a rectal pressure. The numbness is now running down her R leg and down to the toes. Last dose of ibuprofen at 10 AM.

## 2022-05-14 NOTE — Discharge Instructions (Signed)
Use your home pain medicines as you have done previously. Follow-up with your neurosurgeon as directed.

## 2022-05-28 DIAGNOSIS — Q7649 Other congenital malformations of spine, not associated with scoliosis: Secondary | ICD-10-CM | POA: Diagnosis not present

## 2022-05-31 DIAGNOSIS — D508 Other iron deficiency anemias: Secondary | ICD-10-CM | POA: Diagnosis not present

## 2022-05-31 DIAGNOSIS — Q7649 Other congenital malformations of spine, not associated with scoliosis: Secondary | ICD-10-CM | POA: Diagnosis not present

## 2022-05-31 DIAGNOSIS — Z01818 Encounter for other preprocedural examination: Secondary | ICD-10-CM | POA: Diagnosis not present

## 2022-05-31 DIAGNOSIS — Z139 Encounter for screening, unspecified: Secondary | ICD-10-CM | POA: Diagnosis not present

## 2022-05-31 DIAGNOSIS — M47816 Spondylosis without myelopathy or radiculopathy, lumbar region: Secondary | ICD-10-CM | POA: Diagnosis not present

## 2022-05-31 DIAGNOSIS — E271 Primary adrenocortical insufficiency: Secondary | ICD-10-CM | POA: Diagnosis not present

## 2022-05-31 DIAGNOSIS — G90A Postural orthostatic tachycardia syndrome (POTS): Secondary | ICD-10-CM | POA: Diagnosis not present

## 2022-05-31 DIAGNOSIS — Z9889 Other specified postprocedural states: Secondary | ICD-10-CM | POA: Diagnosis not present

## 2022-05-31 DIAGNOSIS — R233 Spontaneous ecchymoses: Secondary | ICD-10-CM | POA: Diagnosis not present

## 2022-06-05 DIAGNOSIS — F331 Major depressive disorder, recurrent, moderate: Secondary | ICD-10-CM | POA: Diagnosis not present

## 2022-06-05 DIAGNOSIS — F411 Generalized anxiety disorder: Secondary | ICD-10-CM | POA: Diagnosis not present

## 2022-06-05 DIAGNOSIS — F605 Obsessive-compulsive personality disorder: Secondary | ICD-10-CM | POA: Diagnosis not present

## 2022-06-15 ENCOUNTER — Telehealth: Payer: Self-pay | Admitting: Orthopaedic Surgery

## 2022-06-15 NOTE — Telephone Encounter (Signed)
Received call from April Manson with [facility], she is seeking physical therapy notes. I advised we don't have any P.T. notes. Though the ov note references patient going to therapy. I gave her [facility] on [employer] ph number. Ph [phone]

## 2022-06-19 DIAGNOSIS — J4520 Mild intermittent asthma, uncomplicated: Secondary | ICD-10-CM | POA: Diagnosis not present

## 2022-06-19 DIAGNOSIS — M5137 Other intervertebral disc degeneration, lumbosacral region: Secondary | ICD-10-CM | POA: Diagnosis not present

## 2022-06-19 DIAGNOSIS — Z9889 Other specified postprocedural states: Secondary | ICD-10-CM | POA: Diagnosis not present

## 2022-06-19 DIAGNOSIS — R000 Tachycardia, unspecified: Secondary | ICD-10-CM | POA: Diagnosis not present

## 2022-06-19 DIAGNOSIS — Z0389 Encounter for observation for other suspected diseases and conditions ruled out: Secondary | ICD-10-CM | POA: Diagnosis not present

## 2022-06-19 DIAGNOSIS — Z91048 Other nonmedicinal substance allergy status: Secondary | ICD-10-CM | POA: Diagnosis not present

## 2022-06-19 DIAGNOSIS — J45909 Unspecified asthma, uncomplicated: Secondary | ICD-10-CM | POA: Diagnosis not present

## 2022-06-19 DIAGNOSIS — Q7649 Other congenital malformations of spine, not associated with scoliosis: Secondary | ICD-10-CM | POA: Diagnosis not present

## 2022-06-19 DIAGNOSIS — Z79899 Other long term (current) drug therapy: Secondary | ICD-10-CM | POA: Diagnosis not present

## 2022-06-19 DIAGNOSIS — Z888 Allergy status to other drugs, medicaments and biological substances status: Secondary | ICD-10-CM | POA: Diagnosis not present

## 2022-06-19 DIAGNOSIS — Z91013 Allergy to seafood: Secondary | ICD-10-CM | POA: Diagnosis not present

## 2022-06-19 DIAGNOSIS — Z9049 Acquired absence of other specified parts of digestive tract: Secondary | ICD-10-CM | POA: Diagnosis not present

## 2022-06-19 DIAGNOSIS — Z91030 Bee allergy status: Secondary | ICD-10-CM | POA: Diagnosis not present

## 2022-06-19 DIAGNOSIS — Z882 Allergy status to sulfonamides status: Secondary | ICD-10-CM | POA: Diagnosis not present

## 2022-06-26 ENCOUNTER — Encounter (HOSPITAL_BASED_OUTPATIENT_CLINIC_OR_DEPARTMENT_OTHER): Payer: Self-pay | Admitting: Emergency Medicine

## 2022-06-26 ENCOUNTER — Other Ambulatory Visit: Payer: Self-pay

## 2022-06-26 ENCOUNTER — Emergency Department (HOSPITAL_BASED_OUTPATIENT_CLINIC_OR_DEPARTMENT_OTHER): Payer: BC Managed Care – PPO

## 2022-06-26 ENCOUNTER — Emergency Department (HOSPITAL_BASED_OUTPATIENT_CLINIC_OR_DEPARTMENT_OTHER)
Admission: EM | Admit: 2022-06-26 | Discharge: 2022-06-26 | Disposition: A | Discharge status: Home / Self Care | Payer: BC Managed Care – PPO | Attending: Emergency Medicine | Admitting: Emergency Medicine

## 2022-06-26 DIAGNOSIS — Q7649 Other congenital malformations of spine, not associated with scoliosis: Secondary | ICD-10-CM | POA: Diagnosis not present

## 2022-06-26 DIAGNOSIS — M48061 Spinal stenosis, lumbar region without neurogenic claudication: Secondary | ICD-10-CM | POA: Diagnosis not present

## 2022-06-26 DIAGNOSIS — T8131XA Disruption of external operation (surgical) wound, not elsewhere classified, initial encounter: Secondary | ICD-10-CM | POA: Diagnosis not present

## 2022-06-26 DIAGNOSIS — T8130XA Disruption of wound, unspecified, initial encounter: Secondary | ICD-10-CM | POA: Diagnosis not present

## 2022-06-26 DIAGNOSIS — L7682 Other postprocedural complications of skin and subcutaneous tissue: Secondary | ICD-10-CM | POA: Diagnosis not present

## 2022-06-26 DIAGNOSIS — S32050A Wedge compression fracture of fifth lumbar vertebra, initial encounter for closed fracture: Secondary | ICD-10-CM | POA: Diagnosis not present

## 2022-06-26 DIAGNOSIS — Z981 Arthrodesis status: Secondary | ICD-10-CM | POA: Diagnosis not present

## 2022-06-26 LAB — CBC WITH DIFFERENTIAL/PLATELET
Abs Immature Granulocytes: 0.04 10*3/uL (ref 0.00–0.07)
Basophils Absolute: 0 10*3/uL (ref 0.0–0.1)
Basophils Relative: 0 %
Eosinophils Absolute: 0.3 10*3/uL (ref 0.0–0.5)
Eosinophils Relative: 4 %
HCT: 32.5 % — ABNORMAL LOW (ref 36.0–46.0)
Hemoglobin: 10.8 g/dL — ABNORMAL LOW (ref 12.0–15.0)
Immature Granulocytes: 1 %
Lymphocytes Relative: 21 %
Lymphs Abs: 1.5 10*3/uL (ref 0.7–4.0)
MCH: 32 pg (ref 26.0–34.0)
MCHC: 33.2 g/dL (ref 30.0–36.0)
MCV: 96.4 fL (ref 80.0–100.0)
Monocytes Absolute: 0.5 10*3/uL (ref 0.1–1.0)
Monocytes Relative: 7 %
Neutro Abs: 4.8 10*3/uL (ref 1.7–7.7)
Neutrophils Relative %: 67 %
Platelets: 355 10*3/uL (ref 150–400)
RBC: 3.37 MIL/uL — ABNORMAL LOW (ref 3.87–5.11)
RDW: 11.9 % (ref 11.5–15.5)
WBC: 7.1 10*3/uL (ref 4.0–10.5)
nRBC: 0 % (ref 0.0–0.2)

## 2022-06-26 LAB — URINALYSIS, ROUTINE W REFLEX MICROSCOPIC
Bilirubin Urine: NEGATIVE
Glucose, UA: NEGATIVE mg/dL
Hgb urine dipstick: NEGATIVE
Ketones, ur: NEGATIVE mg/dL
Leukocytes,Ua: NEGATIVE
Nitrite: NEGATIVE
Protein, ur: NEGATIVE mg/dL
Specific Gravity, Urine: 1.015 (ref 1.005–1.030)
pH: 7 (ref 5.0–8.0)

## 2022-06-26 LAB — COMPREHENSIVE METABOLIC PANEL
ALT: 52 U/L — ABNORMAL HIGH (ref 0–44)
AST: 51 U/L — ABNORMAL HIGH (ref 15–41)
Albumin: 3.9 g/dL (ref 3.5–5.0)
Alkaline Phosphatase: 94 U/L (ref 38–126)
Anion gap: 8 (ref 5–15)
BUN: 7 mg/dL (ref 6–20)
CO2: 25 mmol/L (ref 22–32)
Calcium: 8.7 mg/dL — ABNORMAL LOW (ref 8.9–10.3)
Chloride: 103 mmol/L (ref 98–111)
Creatinine, Ser: 0.68 mg/dL (ref 0.44–1.00)
GFR, Estimated: >60 mL/min (ref >60)
Glucose, Bld: 94 mg/dL (ref 70–99)
Potassium: 3.9 mmol/L (ref 3.5–5.1)
Sodium: 136 mmol/L (ref 135–145)
Total Bilirubin: 0.5 mg/dL (ref 0.3–1.2)
Total Protein: 7.6 g/dL (ref 6.5–8.1)

## 2022-06-26 LAB — LACTIC ACID, PLASMA: Lactic Acid, Venous: 0.7 mmol/L (ref 0.5–1.9)

## 2022-06-26 LAB — PROTIME-INR
INR: 0.9 (ref 0.8–1.2)
Prothrombin Time: 12.4 seconds (ref 11.4–15.2)

## 2022-06-26 MED ORDER — HYDROMORPHONE HCL 1 MG/ML IJ SOLN
1.0000 mg | Freq: Once | INTRAMUSCULAR | Status: AC
  Administered 2022-06-26: 1 mg via INTRAVENOUS
  Filled 2022-06-26: qty 1

## 2022-06-26 MED ORDER — IOHEXOL 300 MG/ML  SOLN
100.0000 mL | Freq: Once | INTRAMUSCULAR | Status: AC | PRN
  Administered 2022-06-26: 100 mL via INTRAVENOUS

## 2022-06-26 NOTE — ED Notes (Signed)
OK to only obtain 1 set of blood cultures per Lorin PA

## 2022-06-26 NOTE — ED Provider Notes (Signed)
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  Patient had L5-S1 anterior lumbar interbody fusion by Dr. Otilio Jefferson with [facility] on 8/22.  She presents today inserted for infection of her wound.  Yesterday she started noticing increased warmth and tenderness of the area around her staples, with malodorous drainage.  Denies any fevers or chills, but has been taking Dilaudid and Tylenol around-the-clock as prescribed.  HPI     Home Medications Prior to Admission medications   Medication Sig Start Date End Date Taking? Authorizing Provider  ABILIFY MAINTENA 400 MG SRER injection Inject 400 mg into the muscle every 28 (twenty-eight) days. 05/25/21   [provider]  acebutolol (SECTRAL) 200 MG capsule Take 1 capsule (200 mg total) by mouth 2 (two) times daily. 09/27/21   Cantwell, Celeste C, PA-C  acetaminophen (TYLENOL) 500 MG tablet Take 2 tablets (1,000 mg total) by mouth every 8 (eight) hours. 08/25/18   Meuth, Brooke A, PA-C  albuterol (VENTOLIN HFA) 108 (90 Base) MCG/ACT inhaler Inhale 1 puff into the lungs as needed. 01/20/15   [provider]  AMBIEN CR 12.5 MG CR tablet Take 12.5 mg by mouth at bedtime. 10/31/15   [provider]  busPIRone (BUSPAR) 10 MG tablet Take 20 mg by mouth 3 (three) times daily. 02/20/21   [provider]  clonazePAM (KLONOPIN) 1 MG tablet Take 1 tablet (1 mg total) by mouth 2 (two) times daily as needed for anxiety. Patient taking differently: Take 1 mg by mouth daily as needed for anxiety. 12/07/13   Theodis Blaze, MD  CYANOCOBALAMIN IJ Inject 1 mL as directed every 30 (thirty) days.    [provider]  DEXILANT 30 MG capsule Take 1  capsule by mouth 2 (two) times daily. 03/29/21   [provider]  diltiazem (CARDIZEM CD) 180 MG 24 hr capsule TAKE 1 CAPSULE DAILY. 06/19/21   Adrian Prows, MD  EPINEPHrine 0.3 mg/0.3 mL IJ SOAJ injection See admin instructions. 07/07/13   [provider]  influenza vac split quadrivalent PF (FLUARIX) 0.5 ML injection Inject into the muscle. 08/22/21   Carlyle Basques, MD  lamoTRIgine (LAMICTAL) 25 MG tablet Take 1 tablet by mouth at bedtime. 09/18/21   [provider]  levalbuterol Penne Lash HFA) 45 MCG/ACT inhaler Inhale into the lungs as needed.    [provider]  montelukast (SINGULAIR) 10 MG tablet Take 10 mg by mouth at bedtime.    [provider]  ondansetron (ZOFRAN-ODT) 8 MG disintegrating tablet 49m ODT q4 hours prn nausea 11/11/21   DVeryl Speak MD  oxyCODONE-acetaminophen (PERCOCET) 5-325 MG tablet Take 1-2 tablets by mouth every 6 (six) hours as needed. 11/11/21   DVeryl Speak MD  pantoprazole (PROTONIX) 40 MG tablet Take 1 tablet (40 mg total) by mouth 2 (two) times daily. 08/25/18   Meuth, Brooke A, PA-C  prochlorperazine (COMPAZINE) 5 MG tablet Take 1 tablet (5 mg total) by mouth every 6 (six) hours as needed for refractory nausea / vomiting (if not controlled with zofran). 08/25/18   Meuth, BBlaine Hamper PA-C  promethazine (PHENERGAN) 25 MG tablet Take 25 mg by mouth every 6 (six) hours as needed. 11/28/20   [provider]  scopolamine (TRANSDERM-SCOP) 1 MG/3DAYS as needed.    [provider]  traZODone (DESYREL) 100 MG tablet Take 300 mg by mouth at bedtime.     [provider]      Allergies    Bee venom; Reglan [metoclopramide]; Shellfish allergy; Wheat bran; Adhesive [tape]; Hydrocod poli-chlorphe poli er; Measles, mumps & rubella vac; Gluten meal; Hydromorphone hcl; Sulfa antibiotics; Tartrazine; and Yellow dye    Review of Systems   Review of Systems  Constitutional:  Negative for chills and fever.   Musculoskeletal:  Positive for back pain.  Skin:  Positive for wound.  All other systems reviewed and are negative.   Physical Exam Updated Vital Signs BP 133/84   Pulse 93   Temp 98.4 F (36.9 C)   Resp 16   Ht 5' 9"  (1.753 m)   Wt 89.8 kg   SpO2 98%   BMI 29.24 kg/m  Physical Exam Vitals and nursing note reviewed.  Constitutional:      Appearance: Normal appearance.  HENT:     Head: Normocephalic and atraumatic.  Eyes:     Conjunctiva/sclera: Conjunctivae normal.  Pulmonary:     Effort: Pulmonary effort is normal. No respiratory distress.  Musculoskeletal:     Comments: Post op wound with staples. Overlying erythema and foul smelling purulent drainage.   Skin:    General: Skin is warm and dry.  Neurological:     Mental Status: She is alert.  Psychiatric:        Mood and Affect: Mood normal.        Behavior: Behavior normal.      ED Results / Procedures / Treatments   Labs (all labs ordered are listed, but only abnormal results are displayed) Labs Reviewed  COMPREHENSIVE METABOLIC PANEL - Abnormal; Notable for the following components:      Result Value   Calcium 8.7 (*)    AST 51 (*)    ALT 52 (*)    All other components within normal limits  CBC WITH DIFFERENTIAL/PLATELET - Abnormal; Notable for the following components:   RBC 3.37 (*)    Hemoglobin 10.8 (*)    HCT 32.5 (*)    All other components within normal limits  URINALYSIS, ROUTINE W REFLEX MICROSCOPIC - Abnormal; Notable for the following components:   APPearance HAZY (*)    All other components within normal limits  CULTURE, BLOOD (ROUTINE X 2)  CULTURE, BLOOD (ROUTINE X 2)  LACTIC ACID, PLASMA  PROTIME-INR    EKG None  Radiology CT LUMBAR SPINE W CONTRAST  Result Date: 06/26/2022 CLINICAL DATA:  Postoperative infection suspected. EXAM: CT LUMBAR SPINE WITH CONTRAST TECHNIQUE: Multidetector CT imaging of the lumbar spine was performed with intravenous contrast administration.  RADIATION DOSE REDUCTION: This exam was performed according to the departmental dose-optimization program which includes automated exposure control, adjustment of the mA and/or kV according to patient size and/or use of iterative reconstruction technique. CONTRAST:  145m OMNIPAQUE IOHEXOL 300 MG/ML  SOLN COMPARISON:  Lumbar spine MRI 05/14/2022. FINDINGS: Segmentation: There is sacralization of L5. Alignment: Normal. Vertebrae: L5-S1 posterior fusion hardware is present with bilateral transpedicular screws. There also surgical screws crossing the disc space. There is a small oblique fracture through the posteroinferior L5 vertebral body with fracture fragment displaced 4 mm posteriorly. No evidence for hardware loosening. Paraspinal and other soft tissues: Posterior skin staples are present compatible with recent surgery. There is lower lumbar subcutaneous edema diffusely. There is presacral edema as well. Ill-defined collection of  air and fluid seen anterior to the S2 vertebral body left of midline measuring proximally 1.7 x 1.6 x 2.4 cm in the presacral space. Disc levels: There is disc space narrowing at L4-L5 and L5-S1 compatible with degenerative change, similar to prior. There is retropulsion of fracture fragment at L5 at the level of the inferior endplate causing moderate central canal stenosis. IMPRESSION: 1. New L5-S1 fusion hardware present. 2. New small acute fracture of the posteroinferior L5 vertebral body with retropulsion of fracture fragment resulting in moderate central canal stenosis. 3. Presacral edema with ill-defined air-fluid collection in the presacral space at the level of S2. Developing abscess not excluded. Electronically Signed   By: Ronney Asters M.D.   On: 06/26/2022 18:57    Procedures Procedures    Medications Ordered in ED Medications  HYDROmorphone (DILAUDID) injection 1 mg (1 mg Intravenous Given 06/26/22 1722)  iohexol (OMNIPAQUE) 300 MG/ML solution 100 mL (100 mLs  Intravenous Contrast Given 06/26/22 1754)  HYDROmorphone (DILAUDID) injection 1 mg (1 mg Intravenous Given 06/26/22 1848)    ED Course/ Medical Decision Making/ A&P                           Medical Decision Making Amount and/or Complexity of Data Reviewed Labs: ordered. Radiology: ordered.  Risk Prescription drug management.  This patient is a 41 y.o. female  who presents to the ED for concern of post operative concern.   Past Medical History / Co-morbidities: Bertolotti's syndrome   Additional history: Chart reviewed. Pertinent results include: L5-S1 anterior lumbar interbody fusion by Dr. Otilio Jefferson with [facility] on 8/22  Physical Exam: Physical exam performed. The pertinent findings include: Post op lower back wound with foul smelling purulent drainage.   Lab Tests/Imaging studies: I personally interpreted labs/imaging and the pertinent results include:  No leukocytosis, unremarkable CMP. Negative lactic.  CT lumbar with contrast shows new acute fracture of L5 vertebral body with retropulsion of fracture fragment and moderate canal stenosis, presacral edema with ill-defined air-fluid collection in the presacral space at S2, cannot exclude abscess. I agree with the radiologist interpretation.  Medications: I ordered medication including pain medication.  I have reviewed the patients home medicines and have made adjustments as needed.   Disposition: Patient discussed and care transferred to [facility] at shift change. Please see his/her note for further details regarding further ED course and disposition. Plan at time of handoff is contact patient's surgical team at [facility] and await recommendations.   Final Clinical Impression(s) / ED Diagnoses Final diagnoses:  Wound dehiscence    Rx / DC Orders ED Discharge Orders     None      Portions of this report may have been transcribed using voice recognition software. Every effort was made to ensure accuracy; however, inadvertent  computerized transcription errors may be present.    Kateri Plummer, PA-C 06/26/22 1922    Wynona Dove A, DO 06/27/22 0139

## 2022-06-26 NOTE — ED Triage Notes (Signed)
Pt arrives pov, to triage in wheelchair, c/o back pain and concern for infection after back surgery 1 wk pta. Surgical site on posterior LT.Surgery was done at [facility]. Pt reports odoriferous surgical site. Pt taking tylenol q 4 hrs, unknown fever, Last tylenol at 1230

## 2022-06-26 NOTE — ED Notes (Signed)
Patient transported to CT 

## 2022-06-26 NOTE — ED Provider Notes (Signed)
41 year old female with post op complication, 1 week ago  (L5-S1 anterior interbody fusion) at [facility]. On APAP around the clock. Wound tender with drainage. Ct today with new fx L5, presacral edema with air/fluid level concerning for abscess. Pt called her surgeon and was recommended to start Keflex.  Physical Exam  BP 136/86   Pulse 100   Temp 98.4 F (36.9 C)   Resp 16   Ht 5' 9"  (1.753 m)   Wt 89.8 kg   SpO2 100%   BMI 29.24 kg/m   Physical Exam  Procedures  Procedures  ED Course / MDM    Medical Decision Making Amount and/or Complexity of Data Reviewed Labs: ordered. Radiology: ordered.  Risk Prescription drug management.   Case discussed with Dr. Otilio Jefferson, patient's surgeon. Dr. Otilio Jefferson is aware of patient's post operative course, recommends continue with oral antibiotics and follow up in office tomorrow as planned. Patient verbalizes understanding.        Tacy Learn, PA-C 06/26/22 [place], [place], DO 06/27/22 [phone]

## 2022-06-26 NOTE — Discharge Instructions (Signed)
Continue with antibiotics as previously prescribed. Follow up with your surgeon as scheduled. Return to ER as needed.

## 2022-07-01 LAB — CULTURE, BLOOD (ROUTINE X 2)
Culture: NO GROWTH
Special Requests: ADEQUATE

## 2022-07-05 ENCOUNTER — Encounter: Payer: Self-pay | Admitting: Cardiology

## 2022-07-05 ENCOUNTER — Telehealth: Payer: Self-pay

## 2022-07-05 NOTE — Telephone Encounter (Signed)
That is good. Will see her then

## 2022-07-06 ENCOUNTER — Encounter (HOSPITAL_BASED_OUTPATIENT_CLINIC_OR_DEPARTMENT_OTHER): Payer: Self-pay

## 2022-07-06 ENCOUNTER — Encounter: Payer: Self-pay | Admitting: Cardiology

## 2022-07-06 ENCOUNTER — Emergency Department (HOSPITAL_BASED_OUTPATIENT_CLINIC_OR_DEPARTMENT_OTHER): Payer: BC Managed Care – PPO

## 2022-07-06 ENCOUNTER — Ambulatory Visit: Payer: BC Managed Care – PPO | Admitting: Cardiology

## 2022-07-06 ENCOUNTER — Other Ambulatory Visit: Payer: Self-pay

## 2022-07-06 ENCOUNTER — Emergency Department (HOSPITAL_BASED_OUTPATIENT_CLINIC_OR_DEPARTMENT_OTHER)
Admission: EM | Admit: 2022-07-06 | Discharge: 2022-07-07 | Disposition: A | Discharge status: Home / Self Care | Payer: BC Managed Care – PPO | Attending: Emergency Medicine | Admitting: Emergency Medicine

## 2022-07-06 VITALS — BP 114/91 | HR 110 | Temp 98.0°F | Resp 16 | Ht 69.0 in | Wt 187.0 lb

## 2022-07-06 DIAGNOSIS — R7989 Other specified abnormal findings of blood chemistry: Secondary | ICD-10-CM

## 2022-07-06 DIAGNOSIS — R0789 Other chest pain: Secondary | ICD-10-CM | POA: Diagnosis not present

## 2022-07-06 DIAGNOSIS — Z981 Arthrodesis status: Secondary | ICD-10-CM | POA: Diagnosis not present

## 2022-07-06 DIAGNOSIS — G90A Postural orthostatic tachycardia syndrome (POTS): Secondary | ICD-10-CM

## 2022-07-06 DIAGNOSIS — R072 Precordial pain: Secondary | ICD-10-CM

## 2022-07-06 DIAGNOSIS — I951 Orthostatic hypotension: Secondary | ICD-10-CM

## 2022-07-06 DIAGNOSIS — R61 Generalized hyperhidrosis: Secondary | ICD-10-CM | POA: Insufficient documentation

## 2022-07-06 DIAGNOSIS — R079 Chest pain, unspecified: Secondary | ICD-10-CM

## 2022-07-06 DIAGNOSIS — Z9889 Other specified postprocedural states: Secondary | ICD-10-CM

## 2022-07-06 DIAGNOSIS — R918 Other nonspecific abnormal finding of lung field: Secondary | ICD-10-CM | POA: Diagnosis not present

## 2022-07-06 DIAGNOSIS — J45909 Unspecified asthma, uncomplicated: Secondary | ICD-10-CM | POA: Insufficient documentation

## 2022-07-06 LAB — COMPLETE METABOLIC PANEL WITH GFR
AG Ratio: 1.4 (calc) (ref 1.0–2.5)
ALT: 22 U/L (ref 6–29)
AST: 17 U/L (ref 10–30)
Albumin: 4.8 g/dL (ref 3.6–5.1)
Alkaline phosphatase (APISO): 101 U/L (ref 31–125)
BUN: 8 mg/dL (ref 7–25)
CO2: 25 mmol/L (ref 20–32)
Calcium: 10.3 mg/dL — ABNORMAL HIGH (ref 8.6–10.2)
Chloride: 103 mmol/L (ref 98–110)
Creat: 0.81 mg/dL (ref 0.50–0.97)
Globulin: 3.5 g/dL (calc) (ref 1.9–3.7)
Glucose, Bld: 98 mg/dL (ref 65–139)
Potassium: 4.7 mmol/L (ref 3.5–5.3)
Sodium: 136 mmol/L (ref 135–146)
Total Bilirubin: 0.3 mg/dL (ref 0.2–1.2)
Total Protein: 8.3 g/dL — ABNORMAL HIGH (ref 6.1–8.1)
eGFR: 95 mL/min/{1.73_m2} (ref >=60)

## 2022-07-06 LAB — BASIC METABOLIC PANEL
Anion gap: 7 (ref 5–15)
BUN: 8 mg/dL (ref 6–20)
CO2: 24 mmol/L (ref 22–32)
Calcium: 9 mg/dL (ref 8.9–10.3)
Chloride: 106 mmol/L (ref 98–111)
Creatinine, Ser: 0.76 mg/dL (ref 0.44–1.00)
GFR, Estimated: >60 mL/min (ref >60)
Glucose, Bld: 107 mg/dL — ABNORMAL HIGH (ref 70–99)
Potassium: 3.5 mmol/L (ref 3.5–5.1)
Sodium: 137 mmol/L (ref 135–145)

## 2022-07-06 LAB — CBC
HCT: 37.3 % (ref 36.0–46.0)
Hemoglobin: 12.5 g/dL (ref 12.0–15.0)
MCH: 32 pg (ref 26.0–34.0)
MCHC: 33.5 g/dL (ref 30.0–36.0)
MCV: 95.4 fL (ref 80.0–100.0)
Platelets: 483 10*3/uL — ABNORMAL HIGH (ref 150–400)
RBC: 3.91 MIL/uL (ref 3.87–5.11)
RDW: 12.1 % (ref 11.5–15.5)
WBC: 9.1 10*3/uL (ref 4.0–10.5)
nRBC: 0 % (ref 0.0–0.2)

## 2022-07-06 LAB — D-DIMER, QUANTITATIVE: D-Dimer, Quant: 0.88 mcg/mL FEU — ABNORMAL HIGH (ref <0.50)

## 2022-07-06 LAB — TROPONIN I (HIGH SENSITIVITY): Troponin I (High Sensitivity): <2 ng/L (ref <18)

## 2022-07-06 LAB — TROPONIN I: Troponin I: <3 ng/L (ref <=47)

## 2022-07-06 MED ORDER — IOHEXOL 350 MG/ML SOLN
75.0000 mL | Freq: Once | INTRAVENOUS | Status: AC | PRN
  Administered 2022-07-06: 75 mL via INTRAVENOUS

## 2022-07-06 NOTE — Progress Notes (Addendum)
Primary Physician:  Maude Leriche, PA-C (Inactive)   Patient ID: Kaitlyn Johnson, female    DOB: 07-13-1981, 41 y.o.   MRN: [id number]  Subjective:    Chief Complaint  Patient presents with   Chest Pain    Sick visit    HPI: Kaitlyn Johnson  is a 41 y.o. 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  Patient has a diagnosis of POTS, celiac disease, superior mesenteric artery syndrome.  She is an established patient of my partner Dr. Einar Gip since 2005 presents today for an acute visit for chest pain evaluation.  She had surgery 2 weeks ago at [facility] for L5-S1 anterior lumbar interbody fusion by Dr. Otilio Jefferson on 8/22.  Approximately a week ago was diagnosed with incision site infection and has been placed on antibiotics.  As of yesterday 2 PM patient has been having chest pain, located substernally, initially present at rest, radiates to the left breast, diaphoresis at times, duration approximately 45 minutes, self-limited.  Symptoms are not brought on by effort related activities.  Pain is not pleuritic.  More noticeable when sitting upright and worse with laying flat.  No sick contacts.  Patient provides outside EKG from 06/20/2022 which notes sinus tachycardia, 129 bpm, normal axis, nonspecific ST-T changes.  She has undergone hysterectomy in the past.  Denies any near-syncope or syncopal events.  Collateral history also provided by her husband.  Past Medical History:  Diagnosis Date   Acute appendicitis 02/28/2017   Acute pancreatitis 11/30/2013   Anemia 11/26/2011   Anxiety    Asthma    Celiac and mesenteric artery injury    Cushing's syndrome ([place])    06/21/16- "in remission"   Depression    H/O hiatal hernia    History of  kidney stones    Iatrogenic Cushing's syndrome ([place]) 10/02/2013   Nausea vomiting and diarrhea 11/23/2013   Nausea with vomiting 11/30/2013   Palpitations 12/05/2018   Pancreatitis 11/29/2013   POTS (postural orthostatic tachycardia syndrome)    Shortness of breath dyspnea    with exertertion   Sphincter of Oddi dysfunction    Von Willebrand disease ([place])    bruising easy   Past Surgical History:  Procedure Laterality Date   ABDOMINAL HYSTERECTOMY  08/2010   ANTERIOR CERVICAL DECOMP/DISCECTOMY FUSION N/A 03/31/2013   Procedure: ANTERIOR CERVICAL DECOMPRESSION/DISCECTOMY FUSION 1 LEVEL Cervical five-six;  Surgeon: Faythe Ghee, MD;  Location: [place] [facility];  Service: Neurosurgery;  Laterality: N/A;   APPENDECTOMY     BACK SURGERY     Cholangio-Pancreatography with Spintectorotomy + Stent  07/16/2012   01/15/14   CHOLECYSTECTOMY     ESOPHAGOGASTRODUODENOSCOPY (EGD) WITH PROPOFOL N/A 08/19/2018   Procedure: ESOPHAGOGASTRODUODENOSCOPY (EGD) WITH PROPOFOL;  Surgeon: Wilford Corner, MD;  Location: [facility];  Service: Endoscopy;  Laterality: N/A;   LAPAROSCOPIC APPENDECTOMY N/A 02/28/2017   Procedure: APPENDECTOMY LAPAROSCOPIC;  Surgeon: Erroll Luna, MD;  Location: [facility];  Service: General;  Laterality: N/A;   LAPAROSCOPIC ENDOMETRIOSIS FULGURATION     LAPAROSCOPIC GASTRIC SLEEVE RESECTION N/A 07/15/2018   Procedure: LAPAROSCOPIC GASTRIC SLEEVE RESECTION, UPPER ENDO, ERAS Pathway;  Surgeon: Johnathan Hausen, MD;  Location: [facility];  Service: General;  Laterality: N/A;   LUMBAR LAMINECTOMY     LUMBAR LAMINECTOMY/DECOMPRESSION MICRODISCECTOMY Left 06/22/2016  Procedure: MICRODISCECTOMY LEFT LUMBAR FOUR-FIVE;  Surgeon: Consuella Lose, MD;  Location: [place] [facility];  Service: Neurosurgery;  Laterality: Left;   ROUX-EN-Y PROCEDURE  04/1999   Family History  Problem Relation Age of Onset   Hypertension Mother    Social History   Tobacco Use   Smoking status: Never   Smokeless  tobacco: Never  Substance Use Topics   Alcohol use: Yes    Alcohol/week: 5.0 standard drinks of alcohol    Types: 5 Glasses of wine per week    Comment: OCC   Marital Status: Married   ROS   Review of Systems  Constitutional: Positive for diaphoresis. Negative for malaise/fatigue and weight gain.  Cardiovascular:  Positive for chest pain. Negative for claudication, leg swelling, near-syncope, orthopnea, palpitations, paroxysmal nocturnal dyspnea and syncope.  Respiratory:  Negative for shortness of breath.   Musculoskeletal:  Positive for back pain.  Neurological:  Positive for light-headedness. Negative for dizziness.      Objective:  Blood pressure (!) 114/91, pulse (!) 110, temperature 98 F (36.7 C), resp. rate 16, height 5' 9"  (1.753 m), weight 187 lb (84.8 kg), SpO2 96 %. Body mass index is 27.62 kg/m.      07/06/2022    2:27 PM 07/06/2022    2:26 PM 06/26/2022    8:00 PM  Vitals with BMI  Height  5' 9"    Weight  187 lbs   BMI  69.6   Systolic 295 284 132  Diastolic 91 90 86  Pulse 440 89 100   Orthostatic VS for the past 72 hrs (Last 3 readings):  Orthostatic BP Patient Position BP Location Cuff Size Orthostatic Pulse  07/06/22 1513 100/71 Standing Left Arm Normal 118  07/06/22 1512 112/82 Sitting Left Arm Normal 102  07/06/22 1511 (!) 136/96 Supine Left Arm Normal 85     Physical Exam Vitals reviewed.  Constitutional:      Appearance: Normal appearance. She is well-developed.  Cardiovascular:     Rate and Rhythm: Regular rhythm. Tachycardia present.     Pulses: Intact distal pulses.     Heart sounds: Normal heart sounds, S1 normal and S2 normal. No murmur heard.    No gallop.  Pulmonary:     Effort: Pulmonary effort is normal. No accessory muscle usage or respiratory distress.     Breath sounds: Normal breath sounds. No wheezing, rhonchi or rales.  Musculoskeletal:     Right lower leg: Edema present.     Left lower leg: Edema present.  Neurological:      Mental Status: She is alert and oriented to person, place, and time.    Laboratory examination:       Latest Ref Rng & Units 06/26/2022    5:08 PM 05/14/2022    5:54 PM 12/26/2021   11:52 AM  CMP  Glucose 70 - 99 mg/dL 94  96  100   BUN 6 - 20 mg/dL 7  7  6    Creatinine 0.44 - 1.00 mg/dL 0.68  0.78  0.70   Sodium 135 - 145 mmol/L 136  138  135   Potassium 3.5 - 5.1 mmol/L 3.9  3.7  4.2   Chloride 98 - 111 mmol/L 103  104  101   CO2 22 - 32 mmol/L 25  24  24    Calcium 8.9 - 10.3 mg/dL 8.7  9.1  9.7   Total Protein 6.5 - 8.1 g/dL 7.6   7.7   Total Bilirubin 0.3 - 1.2 mg/dL 0.5  0.5   Alkaline Phos 38 - 126 U/L 94   74   AST 15 - 41 U/L 51   18   ALT 0 - 44 U/L 52   23       Latest Ref Rng & Units 06/26/2022    5:08 PM 05/14/2022    5:54 PM 12/26/2021   11:52 AM  CBC  WBC 4.0 - 10.5 K/uL 7.1  6.4  7.1   Hemoglobin 12.0 - 15.0 g/dL 10.8  12.8  12.8   Hematocrit 36.0 - 46.0 % 32.5  37.8  38.6   Platelets 150 - 400 K/uL 355  223  215    Lipid Panel     Component Value Date/Time   CHOL 187 11/30/2013 0210   TRIG 197 (H) 08/25/2018 0439   HDL 49 11/30/2013 0210   CHOLHDL 3.8 11/30/2013 0210   VLDL 21 11/30/2013 0210   LDLCALC 117 (H) 11/30/2013 0210   External Labs:  None   Allergies   Allergies  Allergen Reactions   Bee Venom Anaphylaxis   Reglan [Metoclopramide] Other (See Comments)    Reaction:  Oculogyric crisis    Shellfish Allergy Anaphylaxis   Wheat Bran Other (See Comments)    Pt has celiac disease.     Adhesive [Tape] Hives   Hydrocod Poli-Chlorphe Poli Er Other (See Comments)   Measles, Mumps & Rubella Vac     Other reaction(s): slept for 28 hrs   Gluten Meal Other (See Comments)    Pt has celiac disease.    Hydromorphone Hcl     Pt says not allergic   Sulfa Antibiotics Rash   Tartrazine Rash   Yellow Dye Rash    Medications Prior to Visit:   Outpatient Medications Prior to Visit  Medication Sig Dispense Refill   ABILIFY MAINTENA 400 MG SRER  injection Inject 400 mg into the muscle every 28 (twenty-eight) days.     acebutolol (SECTRAL) 200 MG capsule Take 1 capsule (200 mg total) by mouth 2 (two) times daily. 180 capsule 3   acetaminophen (TYLENOL) 500 MG tablet Take 2 tablets (1,000 mg total) by mouth every 8 (eight) hours. 30 tablet 0   albuterol (VENTOLIN HFA) 108 (90 Base) MCG/ACT inhaler Inhale 1 puff into the lungs as needed.     AMBIEN CR 12.5 MG CR tablet Take 12.5 mg by mouth at bedtime.  1   busPIRone (BUSPAR) 10 MG tablet Take 20 mg by mouth 3 (three) times daily.     clonazePAM (KLONOPIN) 1 MG tablet Take 1 tablet (1 mg total) by mouth 2 (two) times daily as needed for anxiety. (Patient taking differently: Take 1 mg by mouth daily as needed for anxiety.) 30 tablet 0   CYANOCOBALAMIN IJ Inject 1 mL as directed every 30 (thirty) days.     DEXILANT 30 MG capsule Take 1 capsule by mouth 2 (two) times daily.     diltiazem (CARDIZEM CD) 180 MG 24 hr capsule TAKE 1 CAPSULE DAILY. 90 capsule 3   EPINEPHrine 0.3 mg/0.3 mL IJ SOAJ injection See admin instructions.     HYDROmorphone (DILAUDID) 2 MG tablet Take 2 mg by mouth every 4 (four) hours as needed.     influenza vac split quadrivalent PF (FLUARIX) 0.5 ML injection Inject into the muscle. 0.5 mL 0   lamoTRIgine (LAMICTAL) 25 MG tablet Take 1 tablet by mouth at bedtime.     levalbuterol (XOPENEX HFA) 45 MCG/ACT inhaler Inhale into the lungs as needed.  methocarbamol (ROBAXIN) 750 MG tablet Take 750 mg by mouth 3 (three) times daily as needed.     montelukast (SINGULAIR) 10 MG tablet Take 10 mg by mouth at bedtime.     ondansetron (ZOFRAN-ODT) 8 MG disintegrating tablet 39m ODT q4 hours prn nausea 10 tablet 0   pantoprazole (PROTONIX) 40 MG tablet Take 1 tablet (40 mg total) by mouth 2 (two) times daily. 30 tablet 1   prochlorperazine (COMPAZINE) 5 MG tablet Take 1 tablet (5 mg total) by mouth every 6 (six) hours as needed for refractory nausea / vomiting (if not controlled  with zofran). 20 tablet 0   promethazine (PHENERGAN) 25 MG tablet Take 25 mg by mouth every 6 (six) hours as needed.     scopolamine (TRANSDERM-SCOP) 1 MG/3DAYS as needed.     traZODone (DESYREL) 100 MG tablet Take 300 mg by mouth at bedtime.      oxyCODONE-acetaminophen (PERCOCET) 5-325 MG tablet Take 1-2 tablets by mouth every 6 (six) hours as needed. (Patient not taking: Reported on 07/06/2022) 12 tablet 0   No facility-administered medications prior to visit.   Final Medications at End of Visit    Current Meds  Medication Sig   ABILIFY MAINTENA 400 MG SRER injection Inject 400 mg into the muscle every 28 (twenty-eight) days.   acebutolol (SECTRAL) 200 MG capsule Take 1 capsule (200 mg total) by mouth 2 (two) times daily.   acetaminophen (TYLENOL) 500 MG tablet Take 2 tablets (1,000 mg total) by mouth every 8 (eight) hours.   albuterol (VENTOLIN HFA) 108 (90 Base) MCG/ACT inhaler Inhale 1 puff into the lungs as needed.   AMBIEN CR 12.5 MG CR tablet Take 12.5 mg by mouth at bedtime.   busPIRone (BUSPAR) 10 MG tablet Take 20 mg by mouth 3 (three) times daily.   clonazePAM (KLONOPIN) 1 MG tablet Take 1 tablet (1 mg total) by mouth 2 (two) times daily as needed for anxiety. (Patient taking differently: Take 1 mg by mouth daily as needed for anxiety.)   CYANOCOBALAMIN IJ Inject 1 mL as directed every 30 (thirty) days.   DEXILANT 30 MG capsule Take 1 capsule by mouth 2 (two) times daily.   diltiazem (CARDIZEM CD) 180 MG 24 hr capsule TAKE 1 CAPSULE DAILY.   EPINEPHrine 0.3 mg/0.3 mL IJ SOAJ injection See admin instructions.   HYDROmorphone (DILAUDID) 2 MG tablet Take 2 mg by mouth every 4 (four) hours as needed.   influenza vac split quadrivalent PF (FLUARIX) 0.5 ML injection Inject into the muscle.   lamoTRIgine (LAMICTAL) 25 MG tablet Take 1 tablet by mouth at bedtime.   levalbuterol (XOPENEX HFA) 45 MCG/ACT inhaler Inhale into the lungs as needed.   methocarbamol (ROBAXIN) 750 MG tablet Take  750 mg by mouth 3 (three) times daily as needed.   montelukast (SINGULAIR) 10 MG tablet Take 10 mg by mouth at bedtime.   ondansetron (ZOFRAN-ODT) 8 MG disintegrating tablet 827mODT q4 hours prn nausea   pantoprazole (PROTONIX) 40 MG tablet Take 1 tablet (40 mg total) by mouth 2 (two) times daily.   prochlorperazine (COMPAZINE) 5 MG tablet Take 1 tablet (5 mg total) by mouth every 6 (six) hours as needed for refractory nausea / vomiting (if not controlled with zofran).   promethazine (PHENERGAN) 25 MG tablet Take 25 mg by mouth every 6 (six) hours as needed.   scopolamine (TRANSDERM-SCOP) 1 MG/3DAYS as needed.   traZODone (DESYREL) 100 MG tablet Take 300 mg by mouth at bedtime.  Cardiac Studies:  09/13/2021: Sinus rhythm at a rate of 89 bpm.  Normal axis.  Otherwise normal EKG.  Compared to EKG 04/24/2021, no significant change.  07/06/2022: Sinus tachycardia, 101 bpm, left atrial enlargement, without underlying ischemia or injury pattern.  Assessment:     ICD-10-CM   1. Precordial pain  R07.2 EKG 12-Lead    PCV ECHOCARDIOGRAM COMPLETE    Troponin I    D-dimer, quantitative    COMPLETE METABOLIC PANEL WITH GFR    2. POTS (postural orthostatic tachycardia syndrome)  G90.A     3. Orthostatic hypotension  I95.1      Recommendations:   Kaitlyn Johnson  is a 41 y.o. 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  Patient symptoms of precordial discomfort and EKG not concerning for ACS at this time.  However given her recent surgery would like to rule out thromboembolic events.  We will check D-dimer.  If needed, CT scan can be ordered without checking urine pregnancy test given her history of hysterectomy.  We will check high sensitive troponin to evaluate for  myocardial injury.  Check CMP.  Echo will be ordered to evaluate for structural heart disease and left ventricular systolic function.  Patient is asked to seek medical attention by the closest ED via EMS if her symptoms worsen in intensity, frequency, and or duration.  Same applies if she has symptoms suggestive of angina pectoris as discussed at today's visit.  History of POTS -well-controlled.  Underlying tachycardia may be secondary to prior diagnosis of POTS, surgical pain (intensity 4 out of 10), infection, postoperative anemia.  Currently on acebutolol and diltiazem.   Orthostatic vital signs are positive for orthostasis.  However, patient states her symptoms are still well controlled.  I have advised her to take her pain medications/antianxiety medications in such a way that she is taking some in the morning and some in the evening to help prevent near-syncope or syncope.  Further recommendations to follow.  We will schedule 4 week follow-up with her primary cardiologist Dr. Einar Gip at patient's request.   Rex Kras, DO, [facility]  Pager: [phone] Office: [phone]   Addendum: LABORATORY DATA:     Latest Ref Rng & Units 07/06/2022    3:39 PM 06/26/2022    5:08 PM 05/14/2022    5:54 PM  CMP  Glucose 65 - 139 mg/dL 98  94  96   BUN 7 - 25 mg/dL 8  7  7    Creatinine 0.50 - 0.97 mg/dL 0.81  0.68  0.78   Sodium 135 - 146 mmol/L 136  136  138   Potassium 3.5 - 5.3 mmol/L 4.7  3.9  3.7   Chloride 98 - 110 mmol/L 103  103  104   CO2 20 - 32 mmol/L 25  25  24    Calcium 8.6 - 10.2 mg/dL 10.3  8.7  9.1   Total Protein 6.1 - 8.1 g/dL 8.3  7.6    Total Bilirubin 0.2 - 1.2 mg/dL 0.3  0.5    Alkaline Phos 38 - 126 U/L  94    AST 10 - 30 U/L 17  51    ALT 6 - 29 U/L 22  52     Troponin I <3  Lab Results  Component Value Date  DDIMER 0.88 (H) 07/06/2022   Will order CT PE protocol to rule out PE and LE venous duplex to rule out DVT.

## 2022-07-06 NOTE — ED Triage Notes (Signed)
Patient c/o chest pain x 2 days - states she has history of POTS and had an elevated ddimer - Patient complains of intermittent chest pain.

## 2022-07-06 NOTE — Telephone Encounter (Signed)
From patient.

## 2022-07-06 NOTE — Discharge Instructions (Addendum)
We evaluated you today for your chest pain and elevated D-dimer level.  Your CT scan in the emergency department was negative for a pulmonary embolism (blood clot).  Your physical exam was not concerning for deep venous thrombosis of the leg (DVT).  Please follow-up with your cardiologist for further work-up.  Please return to the emergency department if you develop any new or worsening symptoms such as leg pain or swelling, fainting, worsening chest pain, difficulty breathing or any other new symptoms.

## 2022-07-06 NOTE — Addendum Note (Signed)
Addended by: Oran Rein on: 07/06/2022 07:15 PM   Modules accepted: Orders

## 2022-07-07 NOTE — ED Provider Notes (Signed)
[facility] DEPART[facility] Provider Note  CSN: [id number] Arrival date & time: 07/06/22 2016  Chief Complaint(s) Chest Pain  HPI Kaitlyn Johnson is a 41 y.o. female with history of POTS presenting to the emergency department with chest pain.  Patient reports chest pain for the past few days which is not pleuritic or exertional.  No syncope.  Denies shortness of breath, cough, runny nose, sore throat.  No nausea or vomiting.  Reports occasional diaphoresis.  Symptoms mild.  Saw her cardiologist who obtained lab testing including positive D-dimer, recommended further evaluation in the emergency department.   Past Medical History Past Medical History:  Diagnosis Date   Acute appendicitis 02/28/2017   Acute pancreatitis 11/30/2013   Anemia 11/26/2011   Anxiety    Asthma    Celiac and mesenteric artery injury    Cushing's syndrome ([place])    06/21/16- "in remission"   Depression    H/O hiatal hernia    History of kidney stones    Iatrogenic Cushing's syndrome ([place]) 10/02/2013   Nausea vomiting and diarrhea 11/23/2013   Nausea with vomiting 11/30/2013   Palpitations 12/05/2018   Pancreatitis 11/29/2013   POTS (postural orthostatic tachycardia syndrome)    Shortness of breath dyspnea    with exertertion   Sphincter of Oddi dysfunction    Von Willebrand disease ([place])    bruising easy   Patient Active Problem List   Diagnosis Date Noted   Palpitations 12/05/2018   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  Authorizing Provider  ABILIFY MAINTENA 400 MG SRER injection Inject 400 mg into the muscle every 28 (twenty-eight) days. 05/25/21   [provider]  acebutolol (SECTRAL) 200 MG capsule Take 1 capsule (200 mg total) by mouth 2 (two) times daily. 09/27/21   Cantwell, Celeste C, PA-C  acetaminophen (TYLENOL) 500 MG tablet Take 2 tablets (1,000 mg total) by mouth every 8 (eight) hours. 08/25/18   Meuth, Brooke A, PA-C  albuterol (VENTOLIN HFA) 108 (90 Base) MCG/ACT inhaler Inhale 1 puff into the lungs as needed. 01/20/15   [provider]  AMBIEN CR 12.5 MG CR tablet Take 12.5 mg by mouth at bedtime. 10/31/15   [provider]  busPIRone (BUSPAR) 10 MG tablet Take 20 mg by mouth 3 (three) times daily. 02/20/21   [provider]  clonazePAM (KLONOPIN) 1 MG tablet Take 1 tablet (1 mg total) by mouth 2 (two) times daily as needed for anxiety. Patient taking differently: Take 1 mg by mouth daily as needed for anxiety. 12/07/13   Theodis Blaze, MD  CYANOCOBALAMIN IJ Inject 1 mL as directed every 30 (thirty) days.    [provider]  DEXILANT 30 MG capsule Take 1 capsule by mouth 2 (two) times daily. 03/29/21   [provider]  diltiazem (CARDIZEM CD) 180 MG 24 hr capsule TAKE 1 CAPSULE DAILY. 06/19/21   Adrian Prows, MD  EPINEPHrine 0.3 mg/0.3 mL IJ SOAJ injection See admin instructions. 07/07/13   [provider]  HYDROmorphone (DILAUDID) 2 MG tablet Take 2 mg by mouth every 4 (four) hours  as needed. 07/03/22   [provider]  influenza vac split quadrivalent PF (FLUARIX) 0.5 ML injection Inject into the muscle. 08/22/21   Carlyle Basques, MD  lamoTRIgine (LAMICTAL) 25 MG tablet Take 1 tablet by mouth at bedtime. 09/18/21   [provider]  levalbuterol Penne Lash HFA) 45 MCG/ACT inhaler Inhale into the lungs as needed.    [provider]  methocarbamol (ROBAXIN) 750 MG tablet Take 750 mg by mouth 3 (three) times daily as needed.  07/03/22   [provider]  montelukast (SINGULAIR) 10 MG tablet Take 10 mg by mouth at bedtime.    [provider]  ondansetron (ZOFRAN-ODT) 8 MG disintegrating tablet 56m ODT q4 hours prn nausea 11/11/21   DVeryl Speak MD  oxyCODONE-acetaminophen (PERCOCET) 5-325 MG tablet Take 1-2 tablets by mouth every 6 (six) hours as needed. Patient not taking: Reported on 07/06/2022 11/11/21   DVeryl Speak MD  pantoprazole (PROTONIX) 40 MG tablet Take 1 tablet (40 mg total) by mouth 2 (two) times daily. 08/25/18   Meuth, Brooke A, PA-C  prochlorperazine (COMPAZINE) 5 MG tablet Take 1 tablet (5 mg total) by mouth every 6 (six) hours as needed for refractory nausea / vomiting (if not controlled with zofran). 08/25/18   Meuth, BBlaine Hamper PA-C  promethazine (PHENERGAN) 25 MG tablet Take 25 mg by mouth every 6 (six) hours as needed. 11/28/20   [provider]  scopolamine (TRANSDERM-SCOP) 1 MG/3DAYS as needed.    [provider]  traZODone (DESYREL) 100 MG tablet Take 300 mg by mouth at bedtime.     [provider]                                                                                                                                    Past Surgical History Past Surgical History:  Procedure Laterality Date   ABDOMINAL HYSTERECTOMY  08/2010   ANTERIOR CERVICAL DECOMP/DISCECTOMY FUSION N/A 03/31/2013   Procedure: ANTERIOR CERVICAL DECOMPRESSION/DISCECTOMY FUSION 1 LEVEL Cervical five-six;  Surgeon: RFaythe Ghee MD;  [facility];  Service: Neurosurgery;  Laterality: N/A;   APPENDECTOMY     BACK SURGERY     [place]-Pancreatography with Spintectorotomy + Stent  07/16/2012   01/15/14   CHOLECYSTECTOMY     ESOPHAGOGASTRODUODENOSCOPY (EGD) WITH PROPOFOL N/A 08/19/2018   Procedure: ESOPHAGOGASTRODUODENOSCOPY (EGD) WITH PROPOFOL;  Surgeon: SWilford Corner MD;  Location: [facility];  Service: Endoscopy;  Laterality: N/A;   LAPAROSCOPIC APPENDECTOMY  N/A 02/28/2017   Procedure: APPENDECTOMY LAPAROSCOPIC;  Surgeon: CErroll Luna MD;  Location: [facility];  Service: General;  Laterality: N/A;   LAPAROSCOPIC ENDOMETRIOSIS FULGURATION     LAPAROSCOPIC GASTRIC SLEEVE RESECTION N/A 07/15/2018   Procedure: LAPAROSCOPIC GASTRIC SLEEVE RESECTION, UPPER ENDO, ERAS Pathway;  Surgeon: MJohnathan Hausen MD;  Location: [facility];  Service: General;  Laterality: N/A;   LUMBAR LAMINECTOMY     LUMBAR LAMINECTOMY/DECOMPRESSION MICRODISCECTOMY Left 06/22/2016   Procedure:  MICRODISCECTOMY LEFT LUMBAR FOUR-FIVE;  Surgeon: Consuella Lose, MD;  Location: [facility];  Service: Neurosurgery;  Laterality: Left;   ROUX-EN-Y PROCEDURE  04/1999   Family History Family History  Problem Relation Age of Onset   Hypertension Mother     Social History Social History   Tobacco Use   Smoking status: Never   Smokeless tobacco: Never  Vaping Use   Vaping Use: Never used  Substance Use Topics   Alcohol use: Yes    Alcohol/week: 5.0 standard drinks of alcohol    Types: 5 Glasses of wine per week    Comment: OCC   Drug use: No   Allergies Bee venom; Reglan [metoclopramide]; Shellfish allergy; Wheat bran; Adhesive [tape]; Hydrocod poli-chlorphe poli er; Measles, mumps & rubella vac; Gluten meal; Hydromorphone hcl; Sulfa antibiotics; Tartrazine; and Yellow dye  Review of Systems Review of Systems  Constitutional:  Negative for chills and fever.  HENT:  Negative for ear pain and sore throat.   Eyes:  Negative for pain and visual disturbance.  Respiratory:  Negative for cough and shortness of breath.   Cardiovascular:  Negative for chest pain and palpitations.  Gastrointestinal:  Negative for abdominal pain and vomiting.  Genitourinary:  Negative for dysuria and hematuria.  Musculoskeletal:  Negative for arthralgias and back pain.  Skin:  Negative for color change and rash.  Neurological:  Negative for seizures and syncope.  All other systems reviewed and are  negative.   Physical Exam Vital Signs  I have reviewed the triage vital signs BP 111/73   Pulse 86   Temp 98.2 F (36.8 C) (Oral)   Resp 15   Ht 5' 9"  (1.753 m)   Wt 84.8 kg   SpO2 100%   BMI 27.62 kg/m  Physical Exam Vitals and nursing note reviewed.  Constitutional:      General: She is not in acute distress.    Appearance: She is well-developed.  HENT:     Head: Normocephalic and atraumatic.     Mouth/Throat:     Mouth: Mucous membranes are moist.  Eyes:     Pupils: Pupils are equal, round, and reactive to light.  Cardiovascular:     Rate and Rhythm: Normal rate and regular rhythm.     Heart sounds: No murmur heard. Pulmonary:     Effort: Pulmonary effort is normal. No respiratory distress.     Breath sounds: Normal breath sounds.  Abdominal:     General: Abdomen is flat.     Palpations: Abdomen is soft.     Tenderness: There is no abdominal tenderness.  Musculoskeletal:        General: No tenderness.     Right lower leg: No edema.     Left lower leg: No edema.  Skin:    General: Skin is warm and dry.     Comments: Bilateral paraspinal surgical wounds in the lumbar region, staples in place, well approximated, no significant surrounding erythema or warmth, no expressible purulence  Neurological:     General: No focal deficit present.     Mental Status: She is alert. Mental status is at baseline.  Psychiatric:        Mood and Affect: Mood normal.        Behavior: Behavior normal.     ED Results and Treatments Labs (all labs ordered are listed, but only abnormal results are displayed) Labs Reviewed  BASIC METABOLIC PANEL - Abnormal; Notable for the following components:      Result  Value   Glucose, Bld 107 (*)    All other components within normal limits  CBC - Abnormal; Notable for the following components:   Platelets 483 (*)    All other components within normal limits  TROPONIN I (HIGH SENSITIVITY)                                                                                                                           Radiology CT Angio Chest PE W/Cm &/Or Wo Cm  Result Date: 07/06/2022 CLINICAL DATA:  Chest pain for 2 days; history of POTS; elevated D-dimer PE suspected EXAM: CT ANGIOGRAPHY CHEST WITH CONTRAST TECHNIQUE: Multidetector CT imaging of the chest was performed using the standard protocol during bolus administration of intravenous contrast. Multiplanar CT image reconstructions and MIPs were obtained to evaluate the vascular anatomy. RADIATION DOSE REDUCTION: This exam was performed according to the departmental dose-optimization program which includes automated exposure control, adjustment of the mA and/or kV according to patient size and/or use of iterative reconstruction technique. CONTRAST:  74m OMNIPAQUE IOHEXOL 350 MG/ML SOLN COMPARISON:  Radiographs earlier today and CT chest 08/10/2018 FINDINGS: Cardiovascular: Satisfactory opacification of the pulmonary arteries to the segmental level. No evidence of pulmonary embolism. Normal heart size. No pericardial effusion. Mediastinum/Nodes: No enlarged mediastinal, hilar, or axillary lymph nodes. Thyroid gland, trachea, and esophagus demonstrate no significant findings. Lungs/Pleura: Lungs are clear. No pleural effusion or pneumothorax. Upper Abdomen: No acute abnormality. Cholecystectomy. Postoperative changes about the stomach. Musculoskeletal: No chest wall abnormality. No acute osseous findings. Anterior cervical spine fusion. Review of the MIP images confirms the above findings. IMPRESSION: Negative for acute pulmonary embolism. No acute findings in the chest. Electronically Signed   By: TPlacido SouM.D.   On: 07/06/2022 23:14   DG Chest 2 View  Result Date: 07/06/2022 CLINICAL DATA:  Chest pain for 2 days. EXAM: CHEST - 2 VIEW COMPARISON:  08/16/2018 FINDINGS: The heart size and mediastinal contours are within normal limits. Both lungs are clear. Cervical spine fusion hardware again  noted. IMPRESSION: No active cardiopulmonary disease. Electronically Signed   By: JMarlaine HindM.D.   On: 07/06/2022 20:39    Pertinent labs & imaging results that were available during my care of the patient were reviewed by me and considered in my medical decision making (see MDM for details).  Medications Ordered in ED Medications  iohexol (OMNIPAQUE) 350 MG/ML injection 75 mL (75 mLs Intravenous Contrast Given 07/06/22 2257)  Procedures Procedures  (including critical care time)  Medical Decision Making / ED Course   MDM:  41 year old female presenting to the emergency department with chest pain.  Patient overall well-appearing, physical exam overall unremarkable.  Prior surgical wounds appear to be healing without obvious signs of infection currently.  EKG with nonspecific ST changes but no STEMI.  Unclear cause of patient's symptoms, low concern for ACS given age, atypical symptoms, negative troponin x2 including previous troponin obtained earlier today.  D-dimer elevated, no clinical evidence of DVT with no calf pain or leg swelling, no erythema of the leg, outpatient providers ordered duplex ultrasound.  CTA of the chest without evidence of PE to explain chest pain.  CTA without evidence of pneumonia, pneumothorax.  Doubt esophageal pathology without nausea or vomiting.  No pericardial effusion on CTA.  Advise close follow-up with patient's cardiologist, will defer ultrasound lower extremities at this time given no clinical evidence of DVT, advised follow-up outpatient. Will discharge patient to home. All questions answered. Patient comfortable with plan of discharge. Return precautions discussed with patient and specified on the after visit summary.       Additional history obtained: -Additional history obtained from spouse -External records from  outside source obtained and reviewed including: Chart review including previous notes, labs, imaging, consultation notes   Lab Tests: -I ordered, reviewed, and interpreted labs.   The pertinent results include:   Labs Reviewed  BASIC METABOLIC PANEL - Abnormal; Notable for the following components:      Result Value   Glucose, Bld 107 (*)    All other components within normal limits  CBC - Abnormal; Notable for the following components:   Platelets 483 (*)    All other components within normal limits  TROPONIN I (HIGH SENSITIVITY)      EKG   EKG Interpretation  Date/Time:    Ventricular Rate:    PR Interval:    QRS Duration:   QT Interval:    QTC Calculation:   R Axis:     Text Interpretation:           Imaging Studies ordered: I ordered imaging studies including CTA chest On my interpretation imaging demonstrates no acute process I independently visualized and interpreted imaging. I agree with the radiologist interpretation   Medicines ordered and prescription drug management: Meds ordered this encounter  Medications   iohexol (OMNIPAQUE) 350 MG/ML injection 75 mL    -I have reviewed the patients home medicines and have made adjustments as needed    Cardiac Monitoring: The patient was maintained on a cardiac monitor.  I personally viewed and interpreted the cardiac monitored which showed an underlying rhythm of: NSR  Reevaluation: After the interventions noted above, I reevaluated the patient and found that they have stayed the same  Co morbidities that complicate the patient evaluation  Past Medical History:  Diagnosis Date   Acute appendicitis 02/28/2017   Acute pancreatitis 11/30/2013   Anemia 11/26/2011   Anxiety    Asthma    Celiac and mesenteric artery injury    Cushing's syndrome ([place])    06/21/16- "in remission"   Depression    H/O hiatal hernia    History of kidney stones    Iatrogenic Cushing's syndrome ([place]) 10/02/2013   Nausea vomiting  and diarrhea 11/23/2013   Nausea with vomiting 11/30/2013   Palpitations 12/05/2018   Pancreatitis 11/29/2013   POTS (postural orthostatic tachycardia syndrome)    Shortness of breath dyspnea    with exertertion  Sphincter of Oddi dysfunction    Von Willebrand disease ([place])    bruising easy      Dispostion: Discharge    Final Clinical Impression(s) / ED Diagnoses Final diagnoses:  Chest pain, unspecified type     This chart was dictated using voice recognition software.  Despite best efforts to proofread,  errors can occur which can change the documentation meaning.    Cristie Hem, MD 07/07/22 1046

## 2022-07-09 ENCOUNTER — Ambulatory Visit (HOSPITAL_COMMUNITY)
Admission: RE | Admit: 2022-07-09 | Discharge: 2022-07-09 | Disposition: A | Discharge status: Home / Self Care | Payer: BC Managed Care – PPO | Source: Ambulatory Visit | Attending: Cardiology | Admitting: Cardiology

## 2022-07-09 DIAGNOSIS — Z9889 Other specified postprocedural states: Secondary | ICD-10-CM | POA: Diagnosis not present

## 2022-07-09 DIAGNOSIS — R7989 Other specified abnormal findings of blood chemistry: Secondary | ICD-10-CM | POA: Insufficient documentation

## 2022-07-09 NOTE — Telephone Encounter (Signed)
Can you see that she gets this appointment made? Thank you!

## 2022-07-09 NOTE — Progress Notes (Signed)
VASCULAR LAB    Bilateral lower extremity venous duplex has been performed.  See CV proc for preliminary results.   Mea Ozga, RVT 07/09/2022, 3:19 PM

## 2022-07-10 ENCOUNTER — Ambulatory Visit: Payer: BC Managed Care – PPO | Admitting: Cardiology

## 2022-07-10 DIAGNOSIS — Z4802 Encounter for removal of sutures: Secondary | ICD-10-CM | POA: Diagnosis not present

## 2022-07-10 DIAGNOSIS — Z4889 Encounter for other specified surgical aftercare: Secondary | ICD-10-CM | POA: Diagnosis not present

## 2022-07-11 ENCOUNTER — Other Ambulatory Visit: Payer: Self-pay | Admitting: Cardiology

## 2022-07-25 DIAGNOSIS — F605 Obsessive-compulsive personality disorder: Secondary | ICD-10-CM | POA: Diagnosis not present

## 2022-07-25 DIAGNOSIS — F411 Generalized anxiety disorder: Secondary | ICD-10-CM | POA: Diagnosis not present

## 2022-07-25 DIAGNOSIS — F331 Major depressive disorder, recurrent, moderate: Secondary | ICD-10-CM | POA: Diagnosis not present

## 2022-07-30 DIAGNOSIS — Z981 Arthrodesis status: Secondary | ICD-10-CM | POA: Diagnosis not present

## 2022-07-30 DIAGNOSIS — M5134 Other intervertebral disc degeneration, thoracic region: Secondary | ICD-10-CM | POA: Diagnosis not present

## 2022-07-30 DIAGNOSIS — M4326 Fusion of spine, lumbar region: Secondary | ICD-10-CM | POA: Diagnosis not present

## 2022-07-30 DIAGNOSIS — M5030 Other cervical disc degeneration, unspecified cervical region: Secondary | ICD-10-CM | POA: Diagnosis not present

## 2022-07-30 DIAGNOSIS — M5136 Other intervertebral disc degeneration, lumbar region: Secondary | ICD-10-CM | POA: Diagnosis not present

## 2022-08-02 ENCOUNTER — Encounter: Payer: Self-pay | Admitting: Cardiology

## 2022-08-02 ENCOUNTER — Ambulatory Visit: Payer: BC Managed Care – PPO | Admitting: Cardiology

## 2022-08-02 VITALS — BP 122/84 | HR 90 | Temp 97.7°F | Resp 16 | Ht 69.0 in | Wt 189.8 lb

## 2022-08-02 DIAGNOSIS — G90A Postural orthostatic tachycardia syndrome (POTS): Secondary | ICD-10-CM | POA: Diagnosis not present

## 2022-08-02 DIAGNOSIS — I471 Supraventricular tachycardia, unspecified: Secondary | ICD-10-CM | POA: Diagnosis not present

## 2022-08-02 DIAGNOSIS — I4711 Inappropriate sinus tachycardia, so stated: Secondary | ICD-10-CM

## 2022-08-02 MED ORDER — IVABRADINE HCL 5 MG PO TABS: 5.0000 mg | ORAL_TABLET | Freq: Two times a day (BID) | ORAL | 2 refills | Status: DC

## 2022-08-02 NOTE — Progress Notes (Addendum)
Primary Physician:  Maude Leriche, PA-C (Inactive)   Patient ID: Kaitlyn Johnson, female    DOB: [birth year], 41 y.o.   MRN: [id number]  Subjective:    Chief Complaint  Patient presents with   Follow-up   Palpitations    HPI: Kaitlyn Johnson  is a 41 y.o. female  female  with hernitated ruptured cervical disc in 2015 and was placed on chronic steroid therapy, since then she developed Cushing's disease, significant weight gain. 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  Since then patient has developed marked dizziness, fatigue, rapid palpitations.  Continues to have marked fatigue, states that she has not been able to play with her children as if she leans down or bends down, feels like she may pass out.  She is also experiencing rapid heartbeat even while just laying down or sitting well.  Her heart rate jumps all the way up to 140 bpm.  Fortunately she has not had any syncope.  No fever or chills.  Surgical scar per patient has healed well.  Past Medical History:  Diagnosis Date   Acute appendicitis 02/28/2017   Acute pancreatitis 11/30/2013   Anemia 11/26/2011   Anxiety    Asthma    Celiac and mesenteric artery injury    Cushing's syndrome ([place])    06/21/16- "in remission"   Depression    H/O hiatal hernia    History of kidney stones    Iatrogenic Cushing's syndrome ([place]) 10/02/2013   Nausea vomiting and diarrhea 11/23/2013   Nausea with vomiting 11/30/2013   Palpitations 12/05/2018   Pancreatitis 11/29/2013   POTS (postural orthostatic tachycardia syndrome)    Shortness of breath dyspnea    with exertertion   Sphincter of Oddi dysfunction    Von Willebrand disease ([place])     bruising easy   Past Surgical History:  Procedure Laterality Date   ABDOMINAL HYSTERECTOMY  08/2010   ANTERIOR CERVICAL DECOMP/DISCECTOMY FUSION N/A 03/31/2013   Procedure: ANTERIOR CERVICAL DECOMPRESSION/DISCECTOMY FUSION 1 LEVEL Cervical five-six;  Surgeon: Faythe Ghee, MD;  Location: [facility];  Service: Neurosurgery;  Laterality: N/A;   APPENDECTOMY     BACK SURGERY     Cholangio-Pancreatography with Spintectorotomy + Stent  07/16/2012   01/15/14   CHOLECYSTECTOMY     ESOPHAGOGASTRODUODENOSCOPY (EGD) WITH PROPOFOL N/A 08/19/2018   Procedure: ESOPHAGOGASTRODUODENOSCOPY (EGD) WITH PROPOFOL;  Surgeon: Wilford Corner, MD;  Location: [facility];  Service: Endoscopy;  Laterality: N/A;   LAPAROSCOPIC APPENDECTOMY N/A 02/28/2017   Procedure: APPENDECTOMY LAPAROSCOPIC;  Surgeon: Erroll Luna, MD;  Location: [facility];  Service: General;  Laterality: N/A;   LAPAROSCOPIC ENDOMETRIOSIS FULGURATION     LAPAROSCOPIC GASTRIC SLEEVE RESECTION N/A 07/15/2018   Procedure: LAPAROSCOPIC GASTRIC SLEEVE RESECTION, UPPER ENDO, ERAS Pathway;  Surgeon: Johnathan Hausen, MD;  Location: [facility];  Service: General;  Laterality: N/A;   LUMBAR LAMINECTOMY     LUMBAR LAMINECTOMY/DECOMPRESSION MICRODISCECTOMY Left 06/22/2016   Procedure: MICRODISCECTOMY LEFT LUMBAR FOUR-FIVE;  Surgeon: Consuella Lose, MD;  Location: [facility];  Service: Neurosurgery;  Laterality: Left;   ROUX-EN-Y PROCEDURE  04/1999   Family History  Problem Relation Age of Onset   Hypertension Mother    Social History   Tobacco Use   Smoking status: Never  Smokeless tobacco: Never  Substance Use Topics   Alcohol use: Yes    Alcohol/week: 5.0 standard drinks of alcohol    Types: 5 Glasses of wine per week    Comment: OCC   Marital Status: Married   ROS   Review of Systems  Cardiovascular:  Positive for palpitations. Negative for chest pain, dyspnea on exertion and leg swelling.  Neurological:  Positive for  dizziness and weakness.   Objective:  Blood pressure 122/84, pulse 90, temperature 97.7 F (36.5 C), temperature source Temporal, resp. rate 16, height 5' 9"  (1.753 m), weight 189 lb 12.8 oz (86.1 kg), SpO2 98 %. Body mass index is 28.03 kg/m.      08/02/2022   10:52 AM 07/06/2022   11:30 PM 07/06/2022   10:30 PM  Vitals with BMI  Height 5' 9"     Weight 189 lbs 13 oz    BMI 09.23    Systolic 300 762 263  Diastolic 84 73 87  Pulse 90 86 90  Orthostatic VS for the past 72 hrs (Last 3 readings):  Patient Position BP Location Cuff Size  08/02/22 1052 Sitting Left Arm Large    Physical Exam Vitals reviewed.  Constitutional:      Appearance: She is well-developed.  Neck:     Vascular: No carotid bruit or JVD.  Cardiovascular:     Rate and Rhythm: Normal rate and regular rhythm.     Pulses: Normal pulses and intact distal pulses.     Heart sounds: Normal heart sounds, S1 normal and S2 normal. No murmur heard.    No gallop.  Pulmonary:     Effort: Pulmonary effort is normal. No accessory muscle usage or respiratory distress.     Breath sounds: Normal breath sounds. No wheezing, rhonchi or rales.  Abdominal:     General: Bowel sounds are normal.     Palpations: Abdomen is soft.  Musculoskeletal:     Right lower leg: No edema.     Left lower leg: No edema.  Neurological:     Mental Status: She is alert.   Physical exam unchanged compared to previous office visit.  Laboratory examination:       Latest Ref Rng & Units 07/06/2022    8:22 PM 07/06/2022    3:39 PM 06/26/2022    5:08 PM  CMP  Glucose 70 - 99 mg/dL 107  98  94   BUN 6 - 20 mg/dL 8  8  7    Creatinine 0.44 - 1.00 mg/dL 0.76  0.81  0.68   Sodium 135 - 145 mmol/L 137  136  136   Potassium 3.5 - 5.1 mmol/L 3.5  4.7  3.9   Chloride 98 - 111 mmol/L 106  103  103   CO2 22 - 32 mmol/L 24  25  25    Calcium 8.9 - 10.3 mg/dL 9.0  10.3  8.7   Total Protein 6.1 - 8.1 g/dL  8.3  7.6   Total Bilirubin 0.2 - 1.2 mg/dL  0.3   0.5   Alkaline Phos 38 - 126 U/L   94   AST 10 - 30 U/L  17  51   ALT 6 - 29 U/L  22  52       Latest Ref Rng & Units 07/06/2022    8:22 PM 06/26/2022    5:08 PM 05/14/2022    5:54 PM  CBC  WBC 4.0 - 10.5 K/uL 9.1  7.1  6.4   Hemoglobin 12.0 -  15.0 g/dL 12.5  10.8  12.8   Hematocrit 36.0 - 46.0 % 37.3  32.5  37.8   Platelets 150 - 400 K/uL 483  355  223    Lipid Panel     Component Value Date/Time   CHOL 187 11/30/2013 0210   TRIG 197 (H) 08/25/2018 0439   HDL 49 11/30/2013 0210   CHOLHDL 3.8 11/30/2013 0210   VLDL 21 11/30/2013 0210   LDLCALC 117 (H) 11/30/2013 0210   External Labs:  None   Allergies   Allergies  Allergen Reactions   Bee Venom Anaphylaxis   Reglan [Metoclopramide] Other (See Comments)    Reaction:  Oculogyric crisis    Shellfish Allergy Anaphylaxis   Wheat Bran Other (See Comments)    Pt has celiac disease.     Adhesive [Tape] Hives   Hydrocod Poli-Chlorphe Poli Er Other (See Comments)   Measles, Mumps & Rubella Vac     Other reaction(s): slept for 28 hrs   Gluten Meal Other (See Comments)    Pt has celiac disease.    Hydromorphone Hcl     Pt says not allergic   Sulfa Antibiotics Rash   Tartrazine Rash   Yellow Dye Rash     Final Medications at End of Visit    Current Outpatient Medications:    ABILIFY MAINTENA 400 MG SRER injection, Inject 400 mg into the muscle every 28 (twenty-eight) days., Disp: , Rfl:    acebutolol (SECTRAL) 200 MG capsule, Take 1 capsule (200 mg total) by mouth 2 (two) times daily., Disp: 180 capsule, Rfl: 3   acetaminophen (TYLENOL) 500 MG tablet, Take 2 tablets (1,000 mg total) by mouth every 8 (eight) hours., Disp: 30 tablet, Rfl: 0   albuterol (VENTOLIN HFA) 108 (90 Base) MCG/ACT inhaler, Inhale 1 puff into the lungs as needed., Disp: , Rfl:    AMBIEN CR 12.5 MG CR tablet, Take 12.5 mg by mouth at bedtime., Disp: , Rfl: 1   busPIRone (BUSPAR) 10 MG tablet, Take 20 mg by mouth 3 (three) times daily., Disp: , Rfl:     clonazePAM (KLONOPIN) 1 MG tablet, Take 1 tablet (1 mg total) by mouth 2 (two) times daily as needed for anxiety. (Patient taking differently: Take 1 mg by mouth daily as needed for anxiety.), Disp: 30 tablet, Rfl: 0   CYANOCOBALAMIN IJ, Inject 1 mL as directed every 30 (thirty) days., Disp: , Rfl:    DEXILANT 30 MG capsule, Take 1 capsule by mouth 2 (two) times daily., Disp: , Rfl:    diltiazem (CARDIZEM CD) 180 MG 24 hr capsule, TAKE ONE CAPSULE BY MOUTH EVERY DAY, Disp: 90 capsule, Rfl: 3   EPINEPHrine 0.3 mg/0.3 mL IJ SOAJ injection, See admin instructions., Disp: , Rfl:    ivabradine (CORLANOR) 5 MG TABS tablet, Take 1 tablet (5 mg total) by mouth 2 (two) times daily with a meal., Disp: 60 tablet, Rfl: 2   lamoTRIgine (LAMICTAL) 25 MG tablet, Take 1 tablet by mouth at bedtime., Disp: , Rfl:    levalbuterol (XOPENEX HFA) 45 MCG/ACT inhaler, Inhale into the lungs as needed., Disp: , Rfl:    montelukast (SINGULAIR) 10 MG tablet, Take 10 mg by mouth at bedtime., Disp: , Rfl:    ondansetron (ZOFRAN-ODT) 8 MG disintegrating tablet, 34m ODT q4 hours prn nausea, Disp: 10 tablet, Rfl: 0   promethazine (PHENERGAN) 25 MG tablet, Take 25 mg by mouth every 6 (six) hours as needed., Disp: , Rfl:    traZODone (DESYREL)  100 MG tablet, Take 300 mg by mouth at bedtime. , Disp: , Rfl:    Cardiac Studies:   CT angiogram of the chest for PE 07/06/2022: Negative for acute pulmonary embolism. No acute findings in the chest.  Normal CTA.  Lower extremity venous duplex 07/09/2022: BILATERAL: - No evidence of deep vein thrombosis seen in the lower extremities, bilaterally. -No evidence of popliteal cyst, bilaterally.  EKG:   EKG 09/13/2021: Sinus rhythm at a rate of 89 bpm.  Normal axis.  Otherwise normal EKG.  Compared to EKG 04/24/2021, no significant change.  Assessment:   Inappropriate sinus tachycardia - Plan: ivabradine (CORLANOR) 5 MG TABS tablet  POTS (postural orthostatic tachycardia  syndrome)  Recommendations:   Kaitlyn Johnson  is a 41 y.o. female  with hernitated ruptured cervical disc in 2015 and was placed on chronic steroid therapy, since then she developed Cushing's disease, significant weight gain. 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  Since then patient has developed marked dizziness, fatigue, rapid palpitations.  I reviewed the results of the CT angiogram of the chest on 07/06/2022 which was negative for PE and normal evaluation and lower extremity venous duplex for leg edema was also negative.  Suspect this is related to postop changes.  I have advised the patient that even minor infections or surgical procedures can sometimes precipitate worsening symptoms of POTS and/or inappropriate sinus tachycardia.  Patient is already on a beta-blocker and calcium channel blocker and heart rate jumps up from a resting 90 bpm to 110 to 120 bpm to standing, fortunately she was not orthostatic.  Best option is to proceed with starting her on Corlanor 5 mg twice daily and we could increase it to 7.5 mg twice daily.  If she does indeed tolerate this, due to episodes of low blood pressure related to POTS, we may be able to discontinue either a beta-blocker or calcium.  General blocker.  Patient is also on multiple mood altering/psychotherapy could be contributing slightly as well to orthostasis and palpitations.  However in view of her medical conditions, no wiggle room for making changes to this.  I would like to see her back in 4 to 6 weeks for follow-up.    Adrian Prows, MD, [facility] 08/02/2022, 9:37 PM Office: [phone] Fax: [phone] Pager: [phone]

## 2022-08-06 ENCOUNTER — Ambulatory Visit: Payer: BC Managed Care – PPO | Admitting: Cardiology

## 2022-08-07 ENCOUNTER — Encounter: Payer: Self-pay | Admitting: Cardiology

## 2022-08-09 DIAGNOSIS — R110 Nausea: Secondary | ICD-10-CM | POA: Diagnosis not present

## 2022-08-14 ENCOUNTER — Encounter: Payer: Self-pay | Admitting: Cardiology

## 2022-08-14 NOTE — Telephone Encounter (Signed)
Will check.

## 2022-08-15 ENCOUNTER — Telehealth: Payer: Self-pay

## 2022-08-15 NOTE — Telephone Encounter (Signed)
Patient called back and states that there needs to be an appeal sent in for medication.

## 2022-08-15 NOTE — Telephone Encounter (Signed)
Patient called requesting samples of Corlanor 7.68m informed patient that we did not have any samples at this time. She also informed me that there was a prior authorization [facility] I explained to patient that I would continue to see if the prior authorization has been sent and I would let her know when we will receive more samples.

## 2022-08-16 DIAGNOSIS — R110 Nausea: Secondary | ICD-10-CM | POA: Diagnosis not present

## 2022-08-21 ENCOUNTER — Encounter: Payer: Self-pay | Admitting: Cardiology

## 2022-08-31 ENCOUNTER — Other Ambulatory Visit: Payer: BC Managed Care – PPO

## 2022-09-07 ENCOUNTER — Ambulatory Visit: Payer: BC Managed Care – PPO

## 2022-09-07 DIAGNOSIS — R072 Precordial pain: Secondary | ICD-10-CM

## 2022-09-13 DIAGNOSIS — J019 Acute sinusitis, unspecified: Secondary | ICD-10-CM | POA: Diagnosis not present

## 2022-09-17 ENCOUNTER — Other Ambulatory Visit: Payer: Self-pay

## 2022-09-17 MED ORDER — IVABRADINE HCL 7.5 MG PO TABS: 7.5000 mg | ORAL_TABLET | Freq: Two times a day (BID) | ORAL | 1 refills | Status: DC

## 2022-10-06 DIAGNOSIS — J069 Acute upper respiratory infection, unspecified: Secondary | ICD-10-CM | POA: Diagnosis not present

## 2022-10-06 DIAGNOSIS — R0602 Shortness of breath: Secondary | ICD-10-CM | POA: Diagnosis not present

## 2022-10-08 DIAGNOSIS — J45901 Unspecified asthma with (acute) exacerbation: Secondary | ICD-10-CM | POA: Diagnosis not present

## 2022-10-08 DIAGNOSIS — R051 Acute cough: Secondary | ICD-10-CM | POA: Diagnosis not present

## 2022-10-11 ENCOUNTER — Encounter: Payer: Self-pay | Admitting: Cardiology

## 2022-10-11 ENCOUNTER — Ambulatory Visit: Payer: BC Managed Care – PPO | Admitting: Cardiology

## 2022-10-11 VITALS — BP 130/85 | HR 80 | Resp 16 | Ht 69.0 in | Wt 191.0 lb

## 2022-10-11 DIAGNOSIS — I4711 Inappropriate sinus tachycardia, so stated: Secondary | ICD-10-CM | POA: Diagnosis not present

## 2022-10-11 DIAGNOSIS — R002 Palpitations: Secondary | ICD-10-CM | POA: Diagnosis not present

## 2022-10-11 DIAGNOSIS — G90A Postural orthostatic tachycardia syndrome (POTS): Secondary | ICD-10-CM | POA: Diagnosis not present

## 2022-10-11 NOTE — Progress Notes (Signed)
Primary Physician:  Maude Leriche, PA-C (Inactive)   Patient ID: Kaitlyn Johnson, female    DOB: 1980-12-08, 41 y.o.   MRN: [id number]  Subjective:    Chief Complaint  Patient presents with   Tachycardia   Hypotension   Follow-up    1 year    HPI: Kaitlyn Johnson  is a 41 y.o. female with hernitated ruptured cervical disc in 2015 and was placed on chronic steroid therapy, since then she developed Cushing's disease, significant weight gain. 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 Since then patient has developed marked dizziness, fatigue, rapid palpitations.  She was started on ivabradine and since then states that she is feeling the best she has in many years with no recurrence of palpitations, dizziness or fatigue.  Past Medical History:  Diagnosis Date   Acute appendicitis 02/28/2017   Acute pancreatitis 11/30/2013   Anemia 11/26/2011   Anxiety    Asthma    Celiac and mesenteric artery injury    Cushing's syndrome ([place])    06/21/16- "in remission"   Depression    H/O hiatal hernia    History of kidney stones    Iatrogenic Cushing's syndrome ([place]) 10/02/2013   Nausea vomiting and diarrhea 11/23/2013   Nausea with vomiting 11/30/2013   Palpitations 12/05/2018   Pancreatitis 11/29/2013   POTS (postural orthostatic tachycardia syndrome)    Shortness of breath dyspnea    with exertertion   Sphincter of Oddi dysfunction    Von Willebrand disease ([place])    bruising easy   Past Surgical History:  Procedure Laterality Date   ABDOMINAL HYSTERECTOMY  08/2010   ANTERIOR CERVICAL DECOMP/DISCECTOMY FUSION N/A 03/31/2013   Procedure: ANTERIOR CERVICAL DECOMPRESSION/DISCECTOMY  FUSION 1 LEVEL Cervical five-six;  Surgeon: Faythe Ghee, MD;  Location: [facility];  Service: Neurosurgery;  Laterality: N/A;   APPENDECTOMY     BACK SURGERY     Cholangio-Pancreatography with Spintectorotomy + Stent  07/16/2012   01/15/14   CHOLECYSTECTOMY     ESOPHAGOGASTRODUODENOSCOPY (EGD) WITH PROPOFOL N/A 08/19/2018   Procedure: ESOPHAGOGASTRODUODENOSCOPY (EGD) WITH PROPOFOL;  Surgeon: Wilford Corner, MD;  [facility];  Service: Endoscopy;  Laterality: N/A;   LAPAROSCOPIC APPENDECTOMY N/A 02/28/2017   Procedure: APPENDECTOMY LAPAROSCOPIC;  Surgeon: Erroll Luna, MD;  Location: [facility];  Service: General;  Laterality: N/A;   LAPAROSCOPIC ENDOMETRIOSIS FULGURATION     LAPAROSCOPIC GASTRIC SLEEVE RESECTION N/A 07/15/2018   Procedure: LAPAROSCOPIC GASTRIC SLEEVE RESECTION, UPPER ENDO, ERAS Pathway;  Surgeon: Johnathan Hausen, MD;  Location: [facility];  Service: General;  Laterality: N/A;   LUMBAR LAMINECTOMY     LUMBAR LAMINECTOMY/DECOMPRESSION MICRODISCECTOMY Left 06/22/2016   Procedure: MICRODISCECTOMY LEFT LUMBAR FOUR-FIVE;  Surgeon: Consuella Lose, MD;  Location: [facility];  Service: Neurosurgery;  Laterality: Left;   ROUX-EN-Y PROCEDURE  04/1999   Family History  Problem Relation Age of Onset   Hypertension Mother    Social History   Tobacco Use   Smoking status: Never   Smokeless tobacco: Never  Substance Use Topics   Alcohol use: Yes    Alcohol/week: 5.0 standard drinks of alcohol    Types: 5 Glasses of wine per week    Comment: OCC   Marital Status: Married   ROS   Review  of Systems  Cardiovascular:  Negative for chest pain, dyspnea on exertion and leg swelling.   Objective:  Blood pressure 130/85, pulse 80, resp. rate 16, height 5' 9"  (1.753 m), weight 191 lb (86.6 kg), SpO2 99 %. Body mass index is 28.21 kg/m.      10/11/2022    3:41 PM 08/02/2022   10:52 AM 07/06/2022   11:30 PM  Vitals with BMI  Height 5' 9"  5' 9"    Weight 191 lbs  189 lbs 13 oz   BMI 40.98 11.91   Systolic 478 295 621  Diastolic 85 84 73  Pulse 80 90 86  Orthostatic VS for the past 72 hrs (Last 3 readings):  Orthostatic BP Patient Position BP Location Cuff Size Orthostatic Pulse  10/11/22 1547 120/83 Standing Left Arm Large 80  10/11/22 1546 132/84 Sitting Left Arm Large 67  10/11/22 1544 130/81 Supine Left Arm Large 72    Physical Exam Neck:     Vascular: No carotid bruit or JVD.  Cardiovascular:     Rate and Rhythm: Normal rate and regular rhythm.     Pulses: Intact distal pulses.     Heart sounds: Normal heart sounds. No murmur heard.    No gallop.  Pulmonary:     Effort: Pulmonary effort is normal.     Breath sounds: Normal breath sounds.  Abdominal:     General: Bowel sounds are normal.     Palpations: Abdomen is soft.  Musculoskeletal:     Right lower leg: No edema.     Left lower leg: No edema.    Laboratory examination:       Latest Ref Rng & Units 07/06/2022    8:22 PM 07/06/2022    3:39 PM 06/26/2022    5:08 PM  CMP  Glucose 70 - 99 mg/dL 107  98  94   BUN 6 - 20 mg/dL 8  8  7    Creatinine 0.44 - 1.00 mg/dL 0.76  0.81  0.68   Sodium 135 - 145 mmol/L 137  136  136   Potassium 3.5 - 5.1 mmol/L 3.5  4.7  3.9   Chloride 98 - 111 mmol/L 106  103  103   CO2 22 - 32 mmol/L 24  25  25    Calcium 8.9 - 10.3 mg/dL 9.0  10.3  8.7   Total Protein 6.1 - 8.1 g/dL  8.3  7.6   Total Bilirubin 0.2 - 1.2 mg/dL  0.3  0.5   Alkaline Phos 38 - 126 U/L   94   AST 10 - 30 U/L  17  51   ALT 6 - 29 U/L  22  52       Latest Ref Rng & Units 07/06/2022    8:22 PM 06/26/2022    5:08 PM 05/14/2022    5:54 PM  CBC  WBC 4.0 - 10.5 K/uL 9.1  7.1  6.4   Hemoglobin 12.0 - 15.0 g/dL 12.5  10.8  12.8   Hematocrit 36.0 - 46.0 % 37.3  32.5  37.8   Platelets 150 - 400 K/uL 483  355  223    Lipid Panel     Component Value Date/Time   CHOL 187 11/30/2013 0210   TRIG 197 (H) 08/25/2018 0439   HDL 49 11/30/2013 0210   CHOLHDL 3.8 11/30/2013 0210    VLDL 21 11/30/2013 0210   LDLCALC 117 (H) 11/30/2013 0210   Allergies   Allergies  Allergen Reactions   Bee  Venom Anaphylaxis   Reglan [Metoclopramide] Other (See Comments)    Reaction:  Oculogyric crisis    Shellfish Allergy Anaphylaxis   Wheat Bran Other (See Comments)    Pt has celiac disease.     Adhesive [Tape] Hives   Hydrocod Poli-Chlorphe Poli Er Other (See Comments)   Measles, Mumps & Rubella Vac     Other reaction(s): slept for 28 hrs   Fd&C Yellow #5 (Tartrazine) Rash   Gluten Meal Other (See Comments)    Pt has celiac disease.    Hydromorphone Hcl     Pt says not allergic   Sulfa Antibiotics Rash   Yellow Dye Rash     Final Medications at End of Visit    Current Outpatient Medications:    ABILIFY MAINTENA 400 MG SRER injection, Inject 400 mg into the muscle every 28 (twenty-eight) days., Disp: , Rfl:    acebutolol (SECTRAL) 200 MG capsule, Take 1 capsule (200 mg total) by mouth 2 (two) times daily., Disp: 180 capsule, Rfl: 3   acetaminophen (TYLENOL) 500 MG tablet, Take 2 tablets (1,000 mg total) by mouth every 8 (eight) hours., Disp: 30 tablet, Rfl: 0   albuterol (VENTOLIN HFA) 108 (90 Base) MCG/ACT inhaler, Inhale 1 puff into the lungs as needed., Disp: , Rfl:    AMBIEN CR 12.5 MG CR tablet, Take 12.5 mg by mouth at bedtime., Disp: , Rfl: 1   busPIRone (BUSPAR) 10 MG tablet, Take 20 mg by mouth 3 (three) times daily., Disp: , Rfl:    desipramine (NORPRAMIN) 25 MG tablet, Take 25 mg by mouth at bedtime., Disp: , Rfl:    clonazePAM (KLONOPIN) 1 MG tablet, Take 1 tablet (1 mg total) by mouth 2 (two) times daily as needed for anxiety. (Patient taking differently: Take 1 mg by mouth daily as needed for anxiety.), Disp: 30 tablet, Rfl: 0   CYANOCOBALAMIN IJ, Inject 1 mL as directed every 30 (thirty) days., Disp: , Rfl:    DEXILANT 30 MG capsule, Take 1 capsule by mouth 2 (two) times daily., Disp: , Rfl:    diltiazem (CARDIZEM CD) 180 MG 24 hr capsule, TAKE ONE CAPSULE  BY MOUTH EVERY DAY, Disp: 90 capsule, Rfl: 3   EPINEPHrine 0.3 mg/0.3 mL IJ SOAJ injection, See admin instructions., Disp: , Rfl:    ivabradine (CORLANOR) 7.5 MG TABS tablet, Take 1 tablet (7.5 mg total) by mouth 2 (two) times daily with a meal., Disp: 180 tablet, Rfl: 1   lamoTRIgine (LAMICTAL) 25 MG tablet, Take 1 tablet by mouth at bedtime., Disp: , Rfl:    levalbuterol (XOPENEX HFA) 45 MCG/ACT inhaler, Inhale into the lungs as needed., Disp: , Rfl:    montelukast (SINGULAIR) 10 MG tablet, Take 10 mg by mouth at bedtime., Disp: , Rfl:    ondansetron (ZOFRAN-ODT) 8 MG disintegrating tablet, 20m ODT q4 hours prn nausea, Disp: 10 tablet, Rfl: 0   promethazine (PHENERGAN) 25 MG tablet, Take 25 mg by mouth every 6 (six) hours as needed., Disp: , Rfl:    traZODone (DESYREL) 100 MG tablet, Take 300 mg by mouth at bedtime. , Disp: , Rfl:    Cardiac Studies:   CT angiogram of the chest for PE 07/06/2022: Negative for acute pulmonary embolism. No acute findings in the chest.  Normal CTA.  Lower extremity venous duplex 07/09/2022: BILATERAL: - No evidence of deep vein thrombosis seen in the lower extremities, bilaterally. -No evidence of popliteal cyst, bilaterally.  EKG:   EKG 10/11/2022: Normal sinus rhythm  at rate of 70 bpm, normal axis, T wave inversion in anterior leads, normal variant.  No evidence of ischemia, normal QT interval.  Compared to 07/06/2022, sinus tachycardia not present.  Assessment:     ICD-10-CM   1. Inappropriate sinus tachycardia  I47.11 EKG 12-Lead    2. POTS (postural orthostatic tachycardia syndrome)  G90.A      Recommendations:   Jolanda Mccann  is a 41 y.o. female with hernitated ruptured cervical disc in 2015 and was placed on chronic steroid therapy, since then she developed Cushing's disease, significant weight gain. 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 Since then patient has developed marked dizziness, fatigue, rapid palpitations.  1. Inappropriate sinus tachycardia Patient has responded very well with starting ivabradine, her heart rate has now been 70 bpm at rest.  Continue the same for now.  2. POTS (postural orthostatic tachycardia syndrome) Patient is also on multiple mood altering/psychotherapy could be contributing slightly as well to orthostasis and palpitations.  However in view of her medical conditions, no wiggle room for making changes to this.  As on I will bradycardia, she has had the best response with no further palpitations, elevated heart rate, no dizziness, she prefers not to make any changes to medications.  She feels the best she has in many years.  I will see her back in a year or sooner if problems.   Adrian Prows, MD, [facility] 10/11/2022, 4:17 PM Office: [phone] Fax: [phone] Pager: [phone]

## 2022-10-12 DIAGNOSIS — J4531 Mild persistent asthma with (acute) exacerbation: Secondary | ICD-10-CM | POA: Diagnosis not present

## 2022-10-12 DIAGNOSIS — E249 Cushing's syndrome, unspecified: Secondary | ICD-10-CM | POA: Diagnosis not present

## 2022-10-12 DIAGNOSIS — R058 Other specified cough: Secondary | ICD-10-CM | POA: Diagnosis not present

## 2022-10-18 DIAGNOSIS — F605 Obsessive-compulsive personality disorder: Secondary | ICD-10-CM | POA: Diagnosis not present

## 2022-10-18 DIAGNOSIS — F411 Generalized anxiety disorder: Secondary | ICD-10-CM | POA: Diagnosis not present

## 2022-10-18 DIAGNOSIS — F331 Major depressive disorder, recurrent, moderate: Secondary | ICD-10-CM | POA: Diagnosis not present

## 2022-10-24 ENCOUNTER — Ambulatory Visit
Admission: RE | Admit: 2022-10-24 | Discharge: 2022-10-24 | Disposition: A | Discharge status: Home / Self Care | Payer: BC Managed Care – PPO | Source: Ambulatory Visit | Attending: Physician Assistant | Admitting: Physician Assistant

## 2022-10-24 ENCOUNTER — Other Ambulatory Visit: Payer: Self-pay | Admitting: Physician Assistant

## 2022-10-24 DIAGNOSIS — R058 Other specified cough: Secondary | ICD-10-CM

## 2022-10-24 DIAGNOSIS — K3184 Gastroparesis: Secondary | ICD-10-CM | POA: Diagnosis not present

## 2022-10-24 DIAGNOSIS — J4520 Mild intermittent asthma, uncomplicated: Secondary | ICD-10-CM | POA: Diagnosis not present

## 2022-10-24 DIAGNOSIS — R059 Cough, unspecified: Secondary | ICD-10-CM | POA: Diagnosis not present

## 2022-11-05 ENCOUNTER — Other Ambulatory Visit: Payer: Self-pay

## 2022-11-05 MED ORDER — ACEBUTOLOL HCL 200 MG PO CAPS: 200.0000 mg | ORAL_CAPSULE | Freq: Two times a day (BID) | ORAL | 3 refills | Status: DC

## 2022-12-03 ENCOUNTER — Ambulatory Visit: Payer: BC Managed Care – PPO | Admitting: Family Medicine

## 2022-12-10 DIAGNOSIS — L918 Other hypertrophic disorders of the skin: Secondary | ICD-10-CM | POA: Diagnosis not present

## 2022-12-10 DIAGNOSIS — Z6829 Body mass index (BMI) 29.0-29.9, adult: Secondary | ICD-10-CM | POA: Diagnosis not present

## 2022-12-10 DIAGNOSIS — Z01419 Encounter for gynecological examination (general) (routine) without abnormal findings: Secondary | ICD-10-CM | POA: Diagnosis not present

## 2022-12-12 ENCOUNTER — Ambulatory Visit (HOSPITAL_BASED_OUTPATIENT_CLINIC_OR_DEPARTMENT_OTHER)
Admission: RE | Admit: 2022-12-12 | Discharge: 2022-12-12 | Disposition: A | Discharge status: Home / Self Care | Payer: BC Managed Care – PPO | Source: Ambulatory Visit | Attending: Adult Health Nurse Practitioner | Admitting: Adult Health Nurse Practitioner

## 2022-12-12 ENCOUNTER — Other Ambulatory Visit (HOSPITAL_COMMUNITY): Payer: Self-pay | Admitting: Adult Health Nurse Practitioner

## 2022-12-12 DIAGNOSIS — Z043 Encounter for examination and observation following other accident: Secondary | ICD-10-CM | POA: Diagnosis not present

## 2022-12-12 DIAGNOSIS — M47816 Spondylosis without myelopathy or radiculopathy, lumbar region: Secondary | ICD-10-CM | POA: Diagnosis not present

## 2022-12-12 DIAGNOSIS — Z981 Arthrodesis status: Secondary | ICD-10-CM | POA: Diagnosis not present

## 2022-12-14 ENCOUNTER — Encounter: Payer: Self-pay | Admitting: Family Medicine

## 2022-12-14 ENCOUNTER — Ambulatory Visit (INDEPENDENT_AMBULATORY_CARE_PROVIDER_SITE_OTHER): Payer: BC Managed Care – PPO | Admitting: Family Medicine

## 2022-12-14 VITALS — BP 118/82 | HR 79 | Temp 97.5°F | Ht 69.0 in | Wt 192.2 lb

## 2022-12-14 DIAGNOSIS — M545 Low back pain, unspecified: Secondary | ICD-10-CM | POA: Diagnosis not present

## 2022-12-14 DIAGNOSIS — G90A Postural orthostatic tachycardia syndrome (POTS): Secondary | ICD-10-CM | POA: Diagnosis not present

## 2022-12-14 DIAGNOSIS — Z7689 Persons encountering health services in other specified circumstances: Secondary | ICD-10-CM

## 2022-12-14 DIAGNOSIS — J4520 Mild intermittent asthma, uncomplicated: Secondary | ICD-10-CM

## 2022-12-14 DIAGNOSIS — K900 Celiac disease: Secondary | ICD-10-CM | POA: Diagnosis not present

## 2022-12-14 NOTE — Progress Notes (Unsigned)
Assessment/Plan:   Problem List Items Addressed This Visit   None   There are no discontinued medications.    Subjective:  HPI: Encounter date: 12/14/2022  Lynleigh Lamport is a 42 y.o. 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   She  has a past medical history of Acute appendicitis (02/28/2017), Acute pancreatitis (11/30/2013), Anemia (11/26/2011), Anxiety, Asthma, Celiac and mesenteric artery injury, Cushing's syndrome ([place]), Depression, H/O hiatal hernia, History of kidney stones, Iatrogenic Cushing's syndrome ([place]) (10/02/2013), Nausea vomiting and diarrhea (11/23/2013), Nausea with vomiting (11/30/2013), Palpitations (12/05/2018), Pancreatitis (11/29/2013), POTS (postural orthostatic tachycardia syndrome), Shortness of breath dyspnea, Sphincter of Oddi dysfunction, and Von Willebrand disease ([place])..   She presents with chief complaint of Establish Care (No concerns) .     Past Surgical History:  Procedure Laterality Date   ABDOMINAL HYSTERECTOMY  08/2010   ANTERIOR CERVICAL DECOMP/DISCECTOMY FUSION N/A 03/31/2013   Procedure: ANTERIOR CERVICAL DECOMPRESSION/DISCECTOMY FUSION 1 LEVEL Cervical five-six;  Surgeon: Faythe Ghee, MD;  Location: [facility];  Service: Neurosurgery;  Laterality: N/A;   APPENDECTOMY     BACK SURGERY     [place]-Pancreatography with Spintectorotomy + Stent  07/16/2012   01/15/14   CHOLECYSTECTOMY     ESOPHAGOGASTRODUODENOSCOPY (EGD) WITH PROPOFOL N/A 08/19/2018   Procedure: ESOPHAGOGASTRODUODENOSCOPY (EGD) WITH PROPOFOL;  Surgeon: Wilford Corner, MD;  Location: [facility];  Service: Endoscopy;  Laterality: N/A;   LAPAROSCOPIC APPENDECTOMY N/A  02/28/2017   Procedure: APPENDECTOMY LAPAROSCOPIC;  Surgeon: Erroll Luna, MD;  Location: [facility];  Service: General;  Laterality: N/A;   LAPAROSCOPIC ENDOMETRIOSIS FULGURATION     LAPAROSCOPIC GASTRIC SLEEVE RESECTION N/A 07/15/2018   Procedure: LAPAROSCOPIC GASTRIC SLEEVE RESECTION, UPPER ENDO, ERAS Pathway;  Surgeon: Johnathan Hausen, MD;  Location: [facility];  Service: General;  Laterality: N/A;   LUMBAR LAMINECTOMY     LUMBAR LAMINECTOMY/DECOMPRESSION MICRODISCECTOMY Left 06/22/2016   Procedure: MICRODISCECTOMY LEFT LUMBAR FOUR-FIVE;  Surgeon: Consuella Lose, MD;  Location: [facility];  Service: Neurosurgery;  Laterality: Left;   ROUX-EN-Y PROCEDURE  04/1999    Outpatient Medications Prior to Visit  Medication Sig Dispense Refill   ABILIFY MAINTENA 400 MG SRER injection Inject 400 mg into the muscle every 28 (twenty-eight) days.     acebutolol (SECTRAL) 200 MG capsule Take 1 capsule (200 mg total) by mouth 2 (two) times daily. 180 capsule 3   albuterol (VENTOLIN HFA) 108 (90 Base) MCG/ACT inhaler Inhale 1 puff into the lungs as needed.     AMBIEN CR 12.5 MG CR tablet Take 12.5 mg by mouth at bedtime.  1   busPIRone (BUSPAR) 10 MG tablet Take 20 mg by mouth 3 (three) times daily.     clonazePAM (KLONOPIN) 1 MG tablet Take 1 tablet (1 mg total) by mouth 2 (two) times daily as needed for anxiety. (Patient taking differently: Take 1 mg by mouth daily as needed for anxiety.) 30 tablet 0   CYANOCOBALAMIN IJ Inject 1 mL as directed every 30 (thirty) days.     desipramine (NORPRAMIN) 25 MG tablet Take 25 mg by mouth at bedtime.     DEXILANT 30 MG capsule Take 1 capsule by mouth 2 (two) times daily.     diltiazem (CARDIZEM CD) 180 MG 24 hr capsule TAKE ONE CAPSULE BY MOUTH EVERY DAY 90 capsule 3  EPINEPHrine 0.3 mg/0.3 mL IJ SOAJ injection See admin instructions.     ivabradine (CORLANOR) 7.5 MG TABS tablet Take 1 tablet (7.5 mg total) by mouth 2 (two) times daily with a meal. 180 tablet 1    lamoTRIgine (LAMICTAL) 25 MG tablet Take 1 tablet by mouth at bedtime.     levalbuterol (XOPENEX HFA) 45 MCG/ACT inhaler Inhale into the lungs as needed.     montelukast (SINGULAIR) 10 MG tablet Take 10 mg by mouth at bedtime.     ondansetron (ZOFRAN-ODT) 8 MG disintegrating tablet 34m ODT q4 hours prn nausea 10 tablet 0   promethazine (PHENERGAN) 25 MG tablet Take 25 mg by mouth every 6 (six) hours as needed.     traZODone (DESYREL) 100 MG tablet Take 300 mg by mouth at bedtime.      acetaminophen (TYLENOL) 500 MG tablet Take 2 tablets (1,000 mg total) by mouth every 8 (eight) hours. 30 tablet 0   No facility-administered medications prior to visit.    Family History  Problem Relation Age of Onset   Hypertension Mother     Social History   Socioeconomic History   Marital status: Married    Spouse name: Not on file   Number of children: 2   Years of education: Not on file   Highest education level: Not on file  Occupational History   Not on file  Tobacco Use   Smoking status: Never    Passive exposure: Never   Smokeless tobacco: Never  Vaping Use   Vaping Use: Never used  Substance and Sexual Activity   Alcohol use: Yes    Alcohol/week: 5.0 standard drinks of alcohol    Types: 5 Glasses of wine per week    Comment: OCC   Drug use: No   Sexual activity: Yes    Partners: Male    Birth control/protection: Surgical  Other Topics Concern   Not on file  Social History Narrative   Regular exercise: can't at this time   Caffeine use: no   Social Determinants of HRadio broadcast assistantStrain: Not on file  Food Insecurity: No Food Insecurity (11/19/2017)   Hunger Vital Sign    Worried About Running Out of Food in the Last Year: Never true    Ran Out of Food in the Last Year: Never true  Transportation Needs: Not on file  Physical Activity: Not on file  Stress: Not on file  Social Connections: Not on file  Intimate Partner Violence: Not on file                                                                                                  Objective:  Physical Exam: BP 118/82 (BP Location: Left Arm, Patient Position: Sitting, Cuff Size: Large)   Pulse 79   Temp (!) 97.5 F (36.4 C) (Temporal)   Ht 5' 9"$  (1.753 m)   Wt 192 lb 3.2 oz (87.2 kg)   SpO2 100%   BMI 28.38 kg/m    ***General: No acute distress. Awake and conversant.  Eyes: Normal conjunctiva, anicteric. Round symmetric  pupils.  ENT: Hearing grossly intact. No nasal discharge.  Neck: Neck is supple. No masses or thyromegaly.  Respiratory: Respirations are non-labored. No auditory wheezing.  Skin: Warm. No rashes or ulcers.  Psych: Alert and oriented. Cooperative, Appropriate mood and affect, Normal judgment.  CV: No cyanosis or JVD MSK: Normal ambulation. No clubbing  Neuro: Sensation and CN II-XII grossly normal.   Physical Exam       Alesia Banda, MD, MS

## 2022-12-16 DIAGNOSIS — M545 Low back pain, unspecified: Secondary | ICD-10-CM | POA: Insufficient documentation

## 2022-12-16 DIAGNOSIS — Z7689 Persons encountering health services in other specified circumstances: Secondary | ICD-10-CM | POA: Insufficient documentation

## 2022-12-16 NOTE — Assessment & Plan Note (Signed)
Stable on ivabradine with significant symptomatic improvement reported. No changes to management recommended at this time.

## 2022-12-16 NOTE — Assessment & Plan Note (Signed)
No new injury found post-fall. Continue observation and encourage the use of over-the-counter analgesics and topical therapies as needed for soreness relief.

## 2022-12-16 NOTE — Assessment & Plan Note (Signed)
Patient reports no acute exacerbation and currently well-controlled on inhaler medications. Continue current medications and monitor for any changes in symptoms.

## 2022-12-16 NOTE — Assessment & Plan Note (Signed)
No active complaints presented. The patient is seeking to establish care for continuity and management of known medical conditions. Continue with current management strategies and medications as they appear effective and well-tolerated.

## 2022-12-16 NOTE — Assessment & Plan Note (Signed)
Managed with diet modification and medication. Continue with current management and follow up with GI specialist as needed.

## 2023-01-02 ENCOUNTER — Encounter (HOSPITAL_BASED_OUTPATIENT_CLINIC_OR_DEPARTMENT_OTHER): Payer: Self-pay | Admitting: Radiology

## 2023-01-02 ENCOUNTER — Inpatient Hospital Stay (HOSPITAL_BASED_OUTPATIENT_CLINIC_OR_DEPARTMENT_OTHER)
Admission: EM | Admit: 2023-01-02 | Discharge: 2023-01-04 | DRG: 092 | Disposition: A | Discharge status: Home / Self Care | Payer: BC Managed Care – PPO | Attending: Internal Medicine | Admitting: Internal Medicine

## 2023-01-02 ENCOUNTER — Other Ambulatory Visit: Payer: Self-pay

## 2023-01-02 ENCOUNTER — Other Ambulatory Visit (HOSPITAL_BASED_OUTPATIENT_CLINIC_OR_DEPARTMENT_OTHER): Payer: Self-pay

## 2023-01-02 ENCOUNTER — Emergency Department (HOSPITAL_BASED_OUTPATIENT_CLINIC_OR_DEPARTMENT_OTHER): Payer: BC Managed Care – PPO

## 2023-01-02 DIAGNOSIS — R109 Unspecified abdominal pain: Secondary | ICD-10-CM | POA: Diagnosis not present

## 2023-01-02 DIAGNOSIS — Z91041 Radiographic dye allergy status: Secondary | ICD-10-CM

## 2023-01-02 DIAGNOSIS — R112 Nausea with vomiting, unspecified: Secondary | ICD-10-CM | POA: Diagnosis not present

## 2023-01-02 DIAGNOSIS — Z8249 Family history of ischemic heart disease and other diseases of the circulatory system: Secondary | ICD-10-CM

## 2023-01-02 DIAGNOSIS — J45909 Unspecified asthma, uncomplicated: Secondary | ICD-10-CM | POA: Diagnosis present

## 2023-01-02 DIAGNOSIS — R822 Biliuria: Secondary | ICD-10-CM | POA: Diagnosis not present

## 2023-01-02 DIAGNOSIS — E876 Hypokalemia: Secondary | ICD-10-CM | POA: Diagnosis not present

## 2023-01-02 DIAGNOSIS — F419 Anxiety disorder, unspecified: Secondary | ICD-10-CM | POA: Diagnosis not present

## 2023-01-02 DIAGNOSIS — E1143 Type 2 diabetes mellitus with diabetic autonomic (poly)neuropathy: Secondary | ICD-10-CM | POA: Diagnosis not present

## 2023-01-02 DIAGNOSIS — K3184 Gastroparesis: Secondary | ICD-10-CM | POA: Diagnosis not present

## 2023-01-02 DIAGNOSIS — Z882 Allergy status to sulfonamides status: Secondary | ICD-10-CM

## 2023-01-02 DIAGNOSIS — Z91013 Allergy to seafood: Secondary | ICD-10-CM | POA: Diagnosis not present

## 2023-01-02 DIAGNOSIS — R809 Proteinuria, unspecified: Secondary | ICD-10-CM | POA: Diagnosis present

## 2023-01-02 DIAGNOSIS — D6800 Von Willebrand disease, unspecified: Secondary | ICD-10-CM | POA: Diagnosis not present

## 2023-01-02 DIAGNOSIS — Z91030 Bee allergy status: Secondary | ICD-10-CM | POA: Diagnosis not present

## 2023-01-02 DIAGNOSIS — F32A Depression, unspecified: Secondary | ICD-10-CM | POA: Diagnosis present

## 2023-01-02 DIAGNOSIS — K551 Chronic vascular disorders of intestine: Secondary | ICD-10-CM | POA: Diagnosis not present

## 2023-01-02 DIAGNOSIS — Z9884 Bariatric surgery status: Secondary | ICD-10-CM

## 2023-01-02 DIAGNOSIS — Z79899 Other long term (current) drug therapy: Secondary | ICD-10-CM

## 2023-01-02 DIAGNOSIS — G90A Postural orthostatic tachycardia syndrome (POTS): Secondary | ICD-10-CM | POA: Diagnosis not present

## 2023-01-02 DIAGNOSIS — K861 Other chronic pancreatitis: Secondary | ICD-10-CM | POA: Diagnosis present

## 2023-01-02 DIAGNOSIS — R1084 Generalized abdominal pain: Secondary | ICD-10-CM | POA: Diagnosis not present

## 2023-01-02 DIAGNOSIS — Z888 Allergy status to other drugs, medicaments and biological substances status: Secondary | ICD-10-CM

## 2023-01-02 DIAGNOSIS — G4700 Insomnia, unspecified: Secondary | ICD-10-CM | POA: Diagnosis not present

## 2023-01-02 DIAGNOSIS — R1110 Vomiting, unspecified: Secondary | ICD-10-CM | POA: Diagnosis not present

## 2023-01-02 DIAGNOSIS — E242 Drug-induced Cushing's syndrome: Secondary | ICD-10-CM | POA: Diagnosis not present

## 2023-01-02 LAB — CBC WITH DIFFERENTIAL/PLATELET
Abs Immature Granulocytes: 0.01 10*3/uL (ref 0.00–0.07)
Basophils Absolute: 0 10*3/uL (ref 0.0–0.1)
Basophils Relative: 1 %
Eosinophils Absolute: 0.1 10*3/uL (ref 0.0–0.5)
Eosinophils Relative: 1 %
HCT: 39.1 % (ref 36.0–46.0)
Hemoglobin: 13 g/dL (ref 12.0–15.0)
Immature Granulocytes: 0 %
Lymphocytes Relative: 25 %
Lymphs Abs: 1.5 10*3/uL (ref 0.7–4.0)
MCH: 31.5 pg (ref 26.0–34.0)
MCHC: 33.2 g/dL (ref 30.0–36.0)
MCV: 94.7 fL (ref 80.0–100.0)
Monocytes Absolute: 0.5 10*3/uL (ref 0.1–1.0)
Monocytes Relative: 8 %
Neutro Abs: 3.9 10*3/uL (ref 1.7–7.7)
Neutrophils Relative %: 65 %
Platelets: 265 10*3/uL (ref 150–400)
RBC: 4.13 MIL/uL (ref 3.87–5.11)
RDW: 13.8 % (ref 11.5–15.5)
WBC: 5.9 10*3/uL (ref 4.0–10.5)
nRBC: 0 % (ref 0.0–0.2)

## 2023-01-02 LAB — URINALYSIS, ROUTINE W REFLEX MICROSCOPIC
Glucose, UA: NEGATIVE mg/dL
Hgb urine dipstick: NEGATIVE
Ketones, ur: NEGATIVE mg/dL
Leukocytes,Ua: NEGATIVE
Nitrite: NEGATIVE
Protein, ur: 30 mg/dL — AB
Specific Gravity, Urine: >=1.03 (ref 1.005–1.030)
pH: 5.5 (ref 5.0–8.0)

## 2023-01-02 LAB — COMPREHENSIVE METABOLIC PANEL
ALT: 20 U/L (ref 0–44)
AST: 25 U/L (ref 15–41)
Albumin: 4.3 g/dL (ref 3.5–5.0)
Alkaline Phosphatase: 58 U/L (ref 38–126)
Anion gap: 8 (ref 5–15)
BUN: 11 mg/dL (ref 6–20)
CO2: 25 mmol/L (ref 22–32)
Calcium: 8.7 mg/dL — ABNORMAL LOW (ref 8.9–10.3)
Chloride: 103 mmol/L (ref 98–111)
Creatinine, Ser: 0.95 mg/dL (ref 0.44–1.00)
GFR, Estimated: >60 mL/min (ref >60)
Glucose, Bld: 99 mg/dL (ref 70–99)
Potassium: 3.7 mmol/L (ref 3.5–5.1)
Sodium: 136 mmol/L (ref 135–145)
Total Bilirubin: 0.7 mg/dL (ref 0.3–1.2)
Total Protein: 8 g/dL (ref 6.5–8.1)

## 2023-01-02 LAB — URINALYSIS, MICROSCOPIC (REFLEX)

## 2023-01-02 LAB — PREGNANCY, URINE: Preg Test, Ur: NEGATIVE

## 2023-01-02 LAB — LIPASE, BLOOD: Lipase: 25 U/L (ref 11–51)

## 2023-01-02 MED ORDER — PROMETHAZINE HCL 25 MG/ML IJ SOLN
INTRAMUSCULAR | Status: AC
  Filled 2023-01-02: qty 1

## 2023-01-02 MED ORDER — DIPHENHYDRAMINE HCL 50 MG/ML IJ SOLN
12.5000 mg | Freq: Once | INTRAMUSCULAR | Status: AC
  Administered 2023-01-02: 12.5 mg via INTRAVENOUS
  Filled 2023-01-02: qty 1

## 2023-01-02 MED ORDER — PANTOPRAZOLE SODIUM 40 MG PO TBEC
40.0000 mg | DELAYED_RELEASE_TABLET | Freq: Two times a day (BID) | ORAL | Status: DC
  Administered 2023-01-02 – 2023-01-04 (×4): 40 mg via ORAL
  Filled 2023-01-02 (×4): qty 1

## 2023-01-02 MED ORDER — BUSPIRONE HCL 10 MG PO TABS
20.0000 mg | ORAL_TABLET | Freq: Three times a day (TID) | ORAL | Status: DC
  Administered 2023-01-02 – 2023-01-04 (×5): 20 mg via ORAL
  Filled 2023-01-02 (×5): qty 2

## 2023-01-02 MED ORDER — LACTATED RINGERS IV BOLUS
1000.0000 mL | Freq: Once | INTRAVENOUS | Status: AC
  Administered 2023-01-02: 1000 mL via INTRAVENOUS

## 2023-01-02 MED ORDER — DESIPRAMINE HCL 25 MG PO TABS
25.0000 mg | ORAL_TABLET | Freq: Every day | ORAL | Status: DC
  Administered 2023-01-02 – 2023-01-03 (×2): 25 mg via ORAL
  Filled 2023-01-02 (×2): qty 1

## 2023-01-02 MED ORDER — HALOPERIDOL LACTATE 5 MG/ML IJ SOLN
2.0000 mg | Freq: Once | INTRAMUSCULAR | Status: AC
  Administered 2023-01-02: 2 mg via INTRAVENOUS
  Filled 2023-01-02: qty 1

## 2023-01-02 MED ORDER — LAMOTRIGINE 100 MG PO TABS
100.0000 mg | ORAL_TABLET | Freq: Every day | ORAL | Status: DC
  Administered 2023-01-02 – 2023-01-03 (×2): 100 mg via ORAL
  Filled 2023-01-02 (×2): qty 1

## 2023-01-02 MED ORDER — PROCHLORPERAZINE EDISYLATE 10 MG/2ML IJ SOLN
10.0000 mg | Freq: Once | INTRAMUSCULAR | Status: AC
  Administered 2023-01-02: 10 mg via INTRAVENOUS
  Filled 2023-01-02: qty 2

## 2023-01-02 MED ORDER — SODIUM CHLORIDE 0.9 % IV BOLUS
1000.0000 mL | Freq: Once | INTRAVENOUS | Status: AC
  Administered 2023-01-02: 1000 mL via INTRAVENOUS

## 2023-01-02 MED ORDER — FENTANYL CITRATE PF 50 MCG/ML IJ SOSY
50.0000 ug | PREFILLED_SYRINGE | Freq: Once | INTRAMUSCULAR | Status: AC
  Administered 2023-01-02: 50 ug via INTRAVENOUS
  Filled 2023-01-02: qty 1

## 2023-01-02 MED ORDER — ONDANSETRON HCL 4 MG/2ML IJ SOLN
4.0000 mg | Freq: Four times a day (QID) | INTRAMUSCULAR | Status: DC | PRN
  Administered 2023-01-02 – 2023-01-04 (×5): 4 mg via INTRAVENOUS
  Filled 2023-01-02 (×5): qty 2

## 2023-01-02 MED ORDER — ACETAMINOPHEN 325 MG PO TABS
650.0000 mg | ORAL_TABLET | Freq: Four times a day (QID) | ORAL | Status: DC | PRN
  Administered 2023-01-02 – 2023-01-03 (×2): 650 mg via ORAL
  Filled 2023-01-02 (×2): qty 2

## 2023-01-02 MED ORDER — POTASSIUM CHLORIDE IN NACL 20-0.9 MEQ/L-% IV SOLN
INTRAVENOUS | Status: AC
  Filled 2023-01-02 (×3): qty 1000

## 2023-01-02 MED ORDER — ONDANSETRON HCL 4 MG/2ML IJ SOLN
4.0000 mg | Freq: Once | INTRAMUSCULAR | Status: AC
  Administered 2023-01-02: 4 mg via INTRAVENOUS
  Filled 2023-01-02: qty 2

## 2023-01-02 MED ORDER — IOHEXOL 300 MG/ML  SOLN
100.0000 mL | Freq: Once | INTRAMUSCULAR | Status: AC | PRN
  Administered 2023-01-02: 100 mL via INTRAVENOUS

## 2023-01-02 MED ORDER — PANTOPRAZOLE SODIUM 40 MG IV SOLR
40.0000 mg | Freq: Once | INTRAVENOUS | Status: AC
  Administered 2023-01-02: 40 mg via INTRAVENOUS
  Filled 2023-01-02: qty 10

## 2023-01-02 MED ORDER — DILTIAZEM HCL ER COATED BEADS 180 MG PO CP24
180.0000 mg | ORAL_CAPSULE | Freq: Every day | ORAL | Status: DC
  Administered 2023-01-03 – 2023-01-04 (×2): 180 mg via ORAL
  Filled 2023-01-02 (×2): qty 1

## 2023-01-02 MED ORDER — CLONAZEPAM 0.5 MG PO TABS: 0.5000 mg | ORAL_TABLET | Freq: Two times a day (BID) | ORAL | Status: DC | PRN

## 2023-01-02 MED ORDER — SODIUM CHLORIDE 0.9 % IV SOLN
25.0000 mg | Freq: Four times a day (QID) | INTRAVENOUS | Status: DC | PRN
  Administered 2023-01-02 – 2023-01-03 (×4): 25 mg via INTRAVENOUS
  Filled 2023-01-02 (×4): qty 25

## 2023-01-02 MED ORDER — ALBUTEROL SULFATE (2.5 MG/3ML) 0.083% IN NEBU: 3.0000 mL | INHALATION_SOLUTION | RESPIRATORY_TRACT | Status: DC | PRN

## 2023-01-02 MED ORDER — IVABRADINE HCL 7.5 MG PO TABS
7.5000 mg | ORAL_TABLET | Freq: Two times a day (BID) | ORAL | Status: DC
  Administered 2023-01-03 – 2023-01-04 (×3): 7.5 mg via ORAL
  Filled 2023-01-02 (×2): qty 1
  Filled 2023-01-02: qty 2
  Filled 2023-01-02: qty 1

## 2023-01-02 MED ORDER — ACEBUTOLOL HCL 200 MG PO CAPS
200.0000 mg | ORAL_CAPSULE | Freq: Two times a day (BID) | ORAL | Status: DC
  Administered 2023-01-02 – 2023-01-04 (×4): 200 mg via ORAL
  Filled 2023-01-02 (×4): qty 1

## 2023-01-02 MED ORDER — HYDROMORPHONE HCL 1 MG/ML IJ SOLN
1.0000 mg | INTRAMUSCULAR | Status: DC | PRN
  Administered 2023-01-02 – 2023-01-04 (×7): 1 mg via INTRAVENOUS
  Filled 2023-01-02 (×8): qty 1

## 2023-01-02 MED ORDER — TRAZODONE HCL 100 MG PO TABS: 100.0000 mg | ORAL_TABLET | Freq: Every evening | ORAL | Status: DC | PRN

## 2023-01-02 MED ORDER — MONTELUKAST SODIUM 10 MG PO TABS
10.0000 mg | ORAL_TABLET | Freq: Every day | ORAL | Status: DC
  Administered 2023-01-02 – 2023-01-03 (×2): 10 mg via ORAL
  Filled 2023-01-02 (×2): qty 1

## 2023-01-02 MED ORDER — SCOPOLAMINE 1 MG/3DAYS TD PT72
1.0000 | MEDICATED_PATCH | TRANSDERMAL | Status: DC
  Administered 2023-01-02: 1.5 mg via TRANSDERMAL
  Filled 2023-01-02: qty 1

## 2023-01-02 MED ORDER — ONDANSETRON 4 MG PO TBDP
4.0000 mg | ORAL_TABLET | Freq: Three times a day (TID) | ORAL | 0 refills | Status: DC | PRN
  Filled 2023-01-02: qty 20, 7d supply, fill #0

## 2023-01-02 MED ORDER — ONDANSETRON HCL 4 MG PO TABS
4.0000 mg | ORAL_TABLET | Freq: Four times a day (QID) | ORAL | Status: DC | PRN
  Administered 2023-01-04: 4 mg via ORAL
  Filled 2023-01-02: qty 1

## 2023-01-02 MED ORDER — ACETAMINOPHEN 650 MG RE SUPP: 650.0000 mg | Freq: Four times a day (QID) | RECTAL | Status: DC | PRN

## 2023-01-02 MED ORDER — SODIUM CHLORIDE 0.9 % IV SOLN
25.0000 mg | Freq: Once | INTRAVENOUS | Status: AC
  Administered 2023-01-02: 25 mg via INTRAVENOUS
  Filled 2023-01-02: qty 1

## 2023-01-02 NOTE — ED Provider Notes (Addendum)
[facility] Provider Note   CSN: [id number] Arrival date & time: 01/02/23  0740     History  Chief Complaint  Patient presents with   Abdominal Pain    Kaitlyn Johnson is a 42 y.o. female.  Patient here with abdominal pain nausea and vomiting.  History of the same.  History of gastric bypass, cholecystectomy, gastric outlet obstruction, gastric bypass surgery.  History of pancreatitis as well.  Nothing makes it worse or better.  Denies any fevers or chills.  No diarrhea.  No chest pain or shortness of breath or weakness or numbness. Feels like this is gastroparesis, phenergen suppositories havent helped.  The history is provided by the patient.       Home Medications Prior to Admission medications   Medication Sig Start Date End Date Taking? Authorizing Provider  clonazePAM (KLONOPIN) 0.5 MG tablet Take 0.5 mg by mouth 2 (two) times daily as needed for anxiety.   Yes [provider]  ondansetron (ZOFRAN-ODT) 4 MG disintegrating tablet Take 1 tablet (4 mg total) by mouth every 8 (eight) hours as needed for nausea or vomiting. 01/02/23  Yes Trust Crago, DO  ABILIFY MAINTENA 400 MG SRER injection Inject 400 mg into the muscle every 28 (twenty-eight) days. 05/25/21   [provider]  acebutolol (SECTRAL) 200 MG capsule Take 1 capsule (200 mg total) by mouth 2 (two) times daily. 11/05/22   Adrian Prows, MD  acetaminophen (TYLENOL) 500 MG tablet Take 2 tablets (1,000 mg total) by mouth every 8 (eight) hours. 08/25/18   Meuth, Brooke A, PA-C  albuterol (VENTOLIN HFA) 108 (90 Base) MCG/ACT inhaler Inhale 1 puff into the lungs as needed. 01/20/15   [provider]  AMBIEN CR 12.5 MG CR tablet Take 12.5 mg by mouth at bedtime. 10/31/15   [provider]  busPIRone (BUSPAR) 10 MG tablet Take 20 mg by mouth 3 (three) times daily. 02/20/21   [provider]  CYANOCOBALAMIN IJ Inject 1 mL as directed every  30 (thirty) days.    [provider]  desipramine (NORPRAMIN) 25 MG tablet Take 25 mg by mouth at bedtime. 10/09/22   [provider]  DEXILANT 30 MG capsule Take 1 capsule by mouth 2 (two) times daily. 03/29/21   [provider]  diltiazem (CARDIZEM CD) 180 MG 24 hr capsule TAKE ONE CAPSULE BY MOUTH EVERY DAY 07/11/22   Adrian Prows, MD  EPINEPHrine 0.3 mg/0.3 mL IJ SOAJ injection See admin instructions. 07/07/13   [provider]  ivabradine (CORLANOR) 7.5 MG TABS tablet Take 1 tablet (7.5 mg total) by mouth 2 (two) times daily with a meal. 09/17/22   Adrian Prows, MD  lamoTRIgine (LAMICTAL) 25 MG tablet Take 1 tablet by mouth at bedtime. 09/18/21   [provider]  levalbuterol Penne Lash HFA) 45 MCG/ACT inhaler Inhale into the lungs as needed.    [provider]  montelukast (SINGULAIR) 10 MG tablet Take 10 mg by mouth at bedtime.    [provider]  promethazine (PHENERGAN) 25 MG tablet Take 25 mg by mouth every 6 (six) hours as needed. 11/28/20   [provider]  traZODone (DESYREL) 100 MG tablet Take 300 mg by mouth at bedtime.     [provider]      Allergies    Bee venom; Reglan [metoclopramide]; Shellfish allergy; Wheat bran; Adhesive [tape]; Measles, mumps & rubella vac; Fd&c yellow #5 (tartrazine); Gluten meal; Sulfa antibiotics; and Yellow dye  Review of Systems   Review of Systems  Physical Exam Updated Vital Signs BP (!) 146/93   Pulse 65   Temp 98 F (36.7 C) (Oral)   Resp 17   Ht '5\' 9"'$  (1.753 m)   Wt 84.4 kg   SpO2 100%   BMI 27.47 kg/m  Physical Exam Vitals and nursing note reviewed.  Constitutional:      General: She is not in acute distress.    Appearance: She is well-developed.  HENT:     Head: Normocephalic and atraumatic.  Eyes:     Conjunctiva/sclera: Conjunctivae normal.  Cardiovascular:     Rate and Rhythm: Normal rate and regular rhythm.     Heart sounds: No murmur  heard. Pulmonary:     Effort: Pulmonary effort is normal. No respiratory distress.     Breath sounds: Normal breath sounds.  Abdominal:     Palpations: Abdomen is soft.     Tenderness: There is abdominal tenderness.  Musculoskeletal:        General: No swelling.     Cervical back: Neck supple.  Skin:    General: Skin is warm and dry.     Capillary Refill: Capillary refill takes less than 2 seconds.  Neurological:     Mental Status: She is alert.  Psychiatric:        Mood and Affect: Mood normal.     ED Results / Procedures / Treatments   Labs (all labs ordered are listed, but only abnormal results are displayed) Labs Reviewed  COMPREHENSIVE METABOLIC PANEL - Abnormal; Notable for the following components:      Result Value   Calcium 8.7 (*)    All other components within normal limits  URINALYSIS, ROUTINE W REFLEX MICROSCOPIC - Abnormal; Notable for the following components:   Bilirubin Urine SMALL (*)    Protein, ur 30 (*)    All other components within normal limits  URINALYSIS, MICROSCOPIC (REFLEX) - Abnormal; Notable for the following components:   Bacteria, UA FEW (*)    All other components within normal limits  CBC WITH DIFFERENTIAL/PLATELET  LIPASE, BLOOD  PREGNANCY, URINE    EKG None  Radiology CT ABDOMEN PELVIS W CONTRAST  Result Date: 01/02/2023 CLINICAL DATA:  Abdominal pain, acute, nonlocalized. Abdominal pain with nausea and vomiting since Monday. History of gastro paresis. EXAM: CT ABDOMEN AND PELVIS WITH CONTRAST TECHNIQUE: Multidetector CT imaging of the abdomen and pelvis was performed using the standard protocol following bolus administration of intravenous contrast. RADIATION DOSE REDUCTION: This exam was performed according to the departmental dose-optimization program which includes automated exposure control, adjustment of the mA and/or kV according to patient size and/or use of iterative reconstruction technique. CONTRAST:  174m OMNIPAQUE IOHEXOL  300 MG/ML  SOLN COMPARISON:  Abdominopelvic CT 11/11/2021. FINDINGS: Lower chest: Clear lung bases. No significant pleural or pericardial effusion. Hepatobiliary: The liver is normal in density without suspicious focal abnormality. No significant biliary dilatation or residual pneumobilia status post cholecystectomy. Pancreas: Unremarkable. No pancreatic ductal dilatation or surrounding inflammatory changes. Spleen: Normal in size without focal abnormality. Adrenals/Urinary Tract: Both adrenal glands appear normal. No evidence of urinary tract calculus, suspicious renal lesion or hydronephrosis. The bladder appears unremarkable for its degree of distention. Stomach/Bowel: No enteric contrast administered. There are stable postsurgical changes from previous gastric sleeve resection and appendectomy. No evidence of bowel wall thickening, distention or surrounding inflammation. Vascular/Lymphatic: There are no enlarged abdominal or pelvic lymph nodes. No significant vascular findings. Reproductive: Hysterectomy.  No adnexal  mass. Other: Postsurgical changes in the low anterior abdominal wall. No evidence of hernia, ascites or free air. Musculoskeletal: No acute or significant osseous findings. Status post L5-S1 fusion. IMPRESSION: 1. No acute findings or explanation for the patient's symptoms. 2. Stable postsurgical changes as described. Electronically Signed   By: Richardean Sale M.D.   On: 01/02/2023 09:37    Procedures Procedures    Medications Ordered in ED Medications  promethazine (PHENERGAN) 25 MG/ML injection (  Not Given 01/02/23 0948)  ondansetron (ZOFRAN) injection 4 mg (4 mg Intravenous Given 01/02/23 0800)  sodium chloride 0.9 % bolus 1,000 mL (0 mLs Intravenous Stopped 01/02/23 0900)  haloperidol lactate (HALDOL) injection 2 mg (2 mg Intravenous Given 01/02/23 0804)  fentaNYL (SUBLIMAZE) injection 50 mcg (50 mcg Intravenous Given 01/02/23 0842)  iohexol (OMNIPAQUE) 300 MG/ML solution 100 mL (100 mLs  Intravenous Contrast Given 01/02/23 0911)  promethazine (PHENERGAN) 25 mg in sodium chloride 0.9 % 50 mL IVPB (0 mg Intravenous Stopped 01/02/23 0951)  diphenhydrAMINE (BENADRYL) injection 12.5 mg (12.5 mg Intravenous Given 01/02/23 0932)    ED Course/ Medical Decision Making/ A&P                             Medical Decision Making Amount and/or Complexity of Data Reviewed Labs: ordered. Radiology: ordered.  Risk Prescription drug management. Decision regarding hospitalization.   Kaitlyn Johnson is here with nausea and vomiting and abdominal pain.  Normal vitals.  No fever.  Differential diagnosis possibly viral process/gastroenteritis but could be acute on chronic process, seems less likely to be bowel obstruction, she has had a gallbladder removed in the past, pancreatitis also on differential.  Will give IV fluids, IV antiemetics and get CBC CMP and lipase as well as CT scan abdomen and pelvis.  Per my review and interpretation of labs is no significant anemia or electrolyte abnormality or kidney injury.  Urinalysis negative for infection. Pregnancy test negative.  CT scan per radiology report with no acute findings.  She still does not feel comfortable.  Cannot tolerate p.o.  Will admit.  This chart was dictated using voice recognition software.  Despite best efforts to proofread,  errors can occur which can change the documentation meaning.         Final Clinical Impression(s) / ED Diagnoses Final diagnoses:  Generalized abdominal pain  Gastroparesis    Rx / DC Orders ED Discharge Orders          Ordered    ondansetron (ZOFRAN-ODT) 4 MG disintegrating tablet  Every 8 hours PRN        01/02/23 0946              Lennice Sites, DO 01/02/23 [place], [place], DO 01/02/23 [phone]

## 2023-01-02 NOTE — Progress Notes (Signed)
Plan of Care Note for accepted transfer   Patient: Kaitlyn Johnson MRN: [id number]   [place]: 01/02/2023  Facility requesting transfer: [facility].  Requesting Provider: Lennice Sites, DO. Reason for transfer: Intractable N/V and abdominal pain. Hx of gastroparesis. Facility course:  Per Dr. Ronnald Nian: "    Chief Complaint  Patient presents with   Abdominal Pain      Kaitlyn Johnson is a 42 y.o. female.   Patient here with abdominal pain nausea and vomiting.  History of the same.  History of gastric bypass, cholecystectomy, gastric outlet obstruction, gastric bypass surgery.  History of pancreatitis as well.  Nothing makes it worse or better.  Denies any fevers or chills.  No diarrhea.  No chest pain or shortness of breath or weakness or numbness. Feels like this is gastroparesis, phenergen suppositories havent helped."  Labwork: Comprehensive metabolic panel [id number] (Abnormal)   Collected: 01/02/23 0800   Updated: 01/02/23 0852   Specimen Type: Blood   Specimen Source: Vein    Sodium 136 mmol/L   Potassium 3.7 mmol/L   Chloride 103 mmol/L   CO2 25 mmol/L   Glucose, Bld 99 mg/dL   BUN 11 mg/dL   Creatinine, Ser 0.95 mg/dL   Calcium 8.7 Low  mg/dL   Total Protein 8.0 g/dL   Albumin 4.3 g/dL   AST 25 U/L   ALT 20 U/L   Alkaline Phosphatase 58 U/L   Total Bilirubin 0.7 mg/dL   GFR, Estimated >60 mL/min   Anion gap 8  Lipase, blood [id number]   Collected: 01/02/23 0800   Updated: 01/02/23 0852   Specimen Type: Blood   Specimen Source: Vein    Lipase 25 U/L  CBC with Differential [id number]   Collected: 01/02/23 0800   Updated: 01/02/23 0825   Specimen Type: Blood   Specimen Source: Vein    WBC 5.9 K/uL   RBC 4.13 MIL/uL   Hemoglobin 13.0 g/dL   HCT 39.1 %   MCV 94.7 fL   MCH 31.5 pg   MCHC 33.2 g/dL   RDW 13.8 %   Platelets 265 K/uL   nRBC 0.0 %   Neutrophils Relative % 65 %   Neutro Abs 3.9 K/uL   Lymphocytes Relative 25 %    Lymphs Abs 1.5 K/uL   Monocytes Relative 8 %   Monocytes Absolute 0.5 K/uL   Eosinophils Relative 1 %   Eosinophils Absolute 0.1 K/uL   Basophils Relative 1 %   Basophils Absolute 0.0 K/uL   Immature Granulocytes 0 %   Abs Immature Granulocytes 0.01 K/uL  Urinalysis, Routine w reflex microscopic -Urine, Clean Catch [id number] (Abnormal)   Collected: 01/02/23 0754   Updated: 01/02/23 0823   Specimen Source: Urine, Clean Catch    Color, Urine YELLOW   APPearance CLEAR   Specific Gravity, Urine >=1.030   pH 5.5   Glucose, UA NEGATIVE mg/dL   Hgb urine dipstick NEGATIVE   Bilirubin Urine SMALL Abnormal    Ketones, ur NEGATIVE mg/dL   Protein, ur 30 Abnormal  mg/dL   Nitrite NEGATIVE   Leukocytes,Ua NEGATIVE  Urinalysis, Microscopic (reflex) [id number] (Abnormal)   Collected: 01/02/23 0754   Updated: 01/02/23 0823    RBC / HPF 0-5 RBC/hpf   WBC, UA 0-5 WBC/hpf   Bacteria, UA FEW Abnormal    Squamous Epithelial / HPF 6-10 /HPF   Mucus PRESENT  Pregnancy, urine [id number]   Collected: 01/02/23 0754   Updated: 01/02/23 0804   Specimen Source:  Urine, Clean Catch    Preg Test, Ur NEGATIVE   Imaging:  FINDINGS: Lower chest: Clear lung bases. No significant pleural or pericardial effusion.   Hepatobiliary: The liver is normal in density without suspicious focal abnormality. No significant biliary dilatation or residual pneumobilia status post cholecystectomy.   Pancreas: Unremarkable. No pancreatic ductal dilatation or surrounding inflammatory changes.   Spleen: Normal in size without focal abnormality.   Adrenals/Urinary Tract: Both adrenal glands appear normal. No evidence of urinary tract calculus, suspicious renal lesion or hydronephrosis. The bladder appears unremarkable for its degree of distention.   Stomach/Bowel: No enteric contrast administered. There are stable postsurgical changes from previous gastric sleeve resection and appendectomy. No evidence of  bowel wall thickening, distention or surrounding inflammation.   Vascular/Lymphatic: There are no enlarged abdominal or pelvic lymph nodes. No significant vascular findings.   Reproductive: Hysterectomy.  No adnexal mass.   Other: Postsurgical changes in the low anterior abdominal wall. No evidence of hernia, ascites or free air.   Musculoskeletal: No acute or significant osseous findings. Status post L5-S1 fusion.   IMPRESSION: 1. No acute findings or explanation for the patient's symptoms. 2. Stable postsurgical changes as described.     Electronically Signed   By: Richardean Sale M.D.   On: 01/02/2023 09:37        Plan of care: The patient is accepted for admission to [place]  unit, at [facility] due to abdominal pain with intractable N/V.   Author: Reubin Milan, MD 01/02/2023  Check [email or web address] for on-call coverage.  Nursing staff, Please call [place] number on Amion as soon as patient's arrival, so appropriate admitting provider can evaluate the pt.

## 2023-01-02 NOTE — H&P (Signed)
History and Physical    Patient: Kaitlyn Johnson [id number] DOB: 11-Nov-1980 DOA: 01/02/2023 DOS: the patient was seen and examined on 01/02/2023 PCP: Bonnita Hollow, MD  Patient coming from: Home  Chief Complaint:  Chief Complaint  Patient presents with   Abdominal Pain   HPI: Kaitlyn Johnson is a 42 y.o. female with medical history significant of acute appendicitis, acute pancreatitis, sphincter of Oddi dysfunction, unspecified anemia, anxiety, depression, asthma, hiatal hernia, nephrolithiasis, iatrogenic Cushing syndrome, palpitations, postural orthostatic tachycardia syndrome, von Willebrand's disease, herpes zoster, superior mesenteric artery syndrome, history of laparoscopic sleeve gastrectomy, history of gastroparesis, chronic diarrhea who presented with abdominal pain associated with multiple episodes of nausea/emesis.  She has noticed decrease in bowel movements. No constipation, melena or hematochezia. No fever, chills or night sweats. No sore throat, rhinorrhea, dyspnea, wheezing or hemoptysis.  No chest pain, palpitations, diaphoresis, PND, orthopnea or pitting edema of the lower extremities.  No flank pain, dysuria, frequency or hematuria.  No polyuria, polydipsia, polyphagia or blurred vision.  ED course: Initial vital signs were temperature 98 F, pulse 65, respirations 16, BP 142/88 mmHg O2 sat 98% on room air.  The patient received 1000 mL of normal saline bolus, 1000 mL of LR bolus, ondansetron 4 mg IVP, pantoprazole 40 mg IVP, promethazine 25 mg IVP, haloperidol 2 mg IVP, fentanyl 50 mcg IVP and 12.5 mg of diphenhydramine prior to arriving to the hospital.  Lab work: Urinalysis shows small bilirubinuria and proteinuria 30 mg/dL with a few bacteria microscopic examination.  Urine pregnancy test is negative.  CBC was normal.  CMP showed a calcium of 8.7 mg deciliter, the rest of the CMP and lipase level were normal.  Imaging: CT abdomen/pelvis with contrast with  no acute findings or explanation for patient's symptoms.   Review of Systems: As mentioned in the history of present illness. All other systems reviewed and are negative. Past Medical History:  Diagnosis Date   Acute appendicitis 02/28/2017   Acute pancreatitis 11/30/2013   Anemia 11/26/2011   Anxiety    Asthma    Celiac and mesenteric artery injury    Cushing's syndrome ([place])    06/21/16- "in remission"   Depression    H/O hiatal hernia    History of kidney stones    Iatrogenic Cushing's syndrome ([place]) 10/02/2013   Nausea vomiting and diarrhea 11/23/2013   Nausea with vomiting 11/30/2013   Palpitations 12/05/2018   Pancreatitis 11/29/2013   POTS (postural orthostatic tachycardia syndrome)    Shortness of breath dyspnea    with exertertion   Sphincter of Oddi dysfunction    Von Willebrand disease ([place])    bruising easy   Past Surgical History:  Procedure Laterality Date   ABDOMINAL HYSTERECTOMY  08/2010   ANTERIOR CERVICAL DECOMP/DISCECTOMY FUSION N/A 03/31/2013   Procedure: ANTERIOR CERVICAL DECOMPRESSION/DISCECTOMY FUSION 1 LEVEL Cervical five-six;  Surgeon: Faythe Ghee, MD;  Location: [facility];  Service: Neurosurgery;  Laterality: N/A;   APPENDECTOMY     BACK SURGERY     [place]angio-Pancreatography with Spintectorotomy + Stent  07/16/2012   01/15/14   CHOLECYSTECTOMY     ESOPHAGOGASTRODUODENOSCOPY (EGD) WITH PROPOFOL N/A 08/19/2018   Procedure: ESOPHAGOGASTRODUODENOSCOPY (EGD) WITH PROPOFOL;  Surgeon: Wilford Corner, MD;  Location: [facility];  Service: Endoscopy;  Laterality: N/A;   LAPAROSCOPIC APPENDECTOMY N/A 02/28/2017   Procedure: APPENDECTOMY LAPAROSCOPIC;  Surgeon: Erroll Luna, MD;  Location: [facility];  Service: General;  Laterality: N/A;   LAPAROSCOPIC ENDOMETRIOSIS FULGURATION  LAPAROSCOPIC GASTRIC SLEEVE RESECTION N/A 07/15/2018   Procedure: LAPAROSCOPIC GASTRIC SLEEVE RESECTION, UPPER ENDO, ERAS Pathway;  Surgeon: Johnathan Hausen, MD;  Location: [facility];   Service: General;  Laterality: N/A;   LUMBAR LAMINECTOMY     LUMBAR LAMINECTOMY/DECOMPRESSION MICRODISCECTOMY Left 06/22/2016   Procedure: MICRODISCECTOMY LEFT LUMBAR FOUR-FIVE;  Surgeon: Consuella Lose, MD;  Location: [facility];  Service: Neurosurgery;  Laterality: Left;   ROUX-EN-Y PROCEDURE  04/1999   Social History:  reports that she has never smoked. She has never been exposed to tobacco smoke. She has never used smokeless tobacco. She reports current alcohol use of about 5.0 standard drinks of alcohol per week. She reports that she does not use drugs.  Allergies  Allergen Reactions   Bee Venom Anaphylaxis   Reglan [Metoclopramide] Other (See Comments)    Reaction:  Oculogyric crisis    Shellfish Allergy Anaphylaxis   Adhesive [Tape] Hives   Measles, Mumps & Rubella Vac     Other reaction(s): slept for 28 hrs   Fd&C Yellow #5 (Tartrazine) Rash   Sulfa Antibiotics Rash   Yellow Dye Rash    Family History  Problem Relation Age of Onset   Hypertension Mother     Prior to Admission medications   Medication Sig Start Date End Date Taking? Authorizing Provider  ABILIFY MAINTENA 400 MG SRER injection Inject 400 mg into the muscle every 28 (twenty-eight) days. 05/25/21  Yes [provider]  acebutolol (SECTRAL) 200 MG capsule Take 1 capsule (200 mg total) by mouth 2 (two) times daily. 11/05/22  Yes Adrian Prows, MD  albuterol (VENTOLIN HFA) 108 (90 Base) MCG/ACT inhaler Inhale 1 puff into the lungs as needed for wheezing or shortness of breath. 01/20/15  Yes [provider]  AMBIEN CR 12.5 MG CR tablet Take 12.5 mg by mouth at bedtime. 10/31/15  Yes [provider]  busPIRone (BUSPAR) 10 MG tablet Take 20 mg by mouth 3 (three) times daily. 02/20/21  Yes [provider]  clonazePAM (KLONOPIN) 0.5 MG tablet Take 0.5 mg by mouth 2 (two) times daily as needed for anxiety.   Yes [provider]  cyanocobalamin (VITAMIN B12) 1000 MCG/ML injection  Inject 1,000 mcg into the skin every 30 (thirty) days. 01/06/16  Yes [provider]  desipramine (NORPRAMIN) 25 MG tablet Take 25 mg by mouth at bedtime. 10/09/22  Yes [provider]  DEXILANT 30 MG capsule Take 30 mg by mouth 2 (two) times daily. 03/29/21  Yes [provider]  diltiazem (CARDIZEM CD) 180 MG 24 hr capsule TAKE ONE CAPSULE BY MOUTH EVERY DAY 07/11/22  Yes Adrian Prows, MD  EPINEPHrine 0.3 mg/0.3 mL IJ SOAJ injection Inject 0.3 mg into the muscle as needed for anaphylaxis. 07/07/13  Yes [provider]  ivabradine (CORLANOR) 7.5 MG TABS tablet Take 1 tablet (7.5 mg total) by mouth 2 (two) times daily with a meal. 09/17/22  Yes Adrian Prows, MD  lamoTRIgine (LAMICTAL) 100 MG tablet Take 100 mg by mouth at bedtime. 09/18/21  Yes [provider]  levalbuterol (XOPENEX HFA) 45 MCG/ACT inhaler Inhale 1 puff into the lungs as needed for wheezing or shortness of breath.   Yes [provider]  levalbuterol (XOPENEX) 0.63 MG/3ML nebulizer solution Take 0.63 mg by nebulization every 6 (six) hours as needed for wheezing or shortness of breath.   Yes [provider]  montelukast (SINGULAIR) 10 MG tablet Take 10 mg by mouth at bedtime.   Yes [provider]  ondansetron (ZOFRAN-ODT) 4 MG disintegrating tablet Take 1 tablet (4 mg total) by mouth every 8 (eight) hours as needed for nausea or vomiting. 01/02/23  Yes Curatolo, Adam, DO  ondansetron (ZOFRAN-ODT) 4 MG disintegrating tablet Take 4 mg by mouth every 8 (eight) hours as needed for nausea or vomiting. 08/25/18  Yes [provider]  promethazine (PHENERGAN) 25 MG suppository Place 25 mg rectally every 6 (six) hours as needed for nausea. Use for up to 7 days. 12/31/22 01/07/23 Yes [provider]  traZODone (DESYREL) 100 MG tablet Take 100-300 mg by mouth at bedtime as needed for sleep.   Yes [provider]  acetaminophen (TYLENOL) 500 MG tablet Take 2 tablets  (1,000 mg total) by mouth every 8 (eight) hours. Patient not taking: Reported on 01/02/2023 08/25/18   Wellington Hampshire, PA-C    Physical Exam: Vitals:   01/02/23 1000 01/02/23 1030 01/02/23 1130 01/02/23 1241  BP: (!) 144/94 (!) 149/100 135/81 (!) 145/91  Pulse: 66 66 71 (!) 54  Resp: '17 16 17 '$ (!) 22  Temp:   98.2 F (36.8 C) 98 F (36.7 C)  TempSrc:      SpO2: 96% 100% 99% 100%  Weight:      Height:       Physical Exam Vitals and nursing note reviewed.  Constitutional:      Appearance: She is well-developed.  HENT:     Head: Normocephalic.     Nose: No rhinorrhea.     Mouth/Throat:     Mouth: Mucous membranes are dry.  Eyes:     General: No scleral icterus.    Pupils: Pupils are equal, round, and reactive to light.  Neck:     Vascular: No JVD.  Cardiovascular:     Rate and Rhythm: Normal rate and regular rhythm.     Heart sounds: S1 normal and S2 normal.  Pulmonary:     Effort: Pulmonary effort is normal.     Breath sounds: Normal breath sounds.  Chest:     Chest wall: No tenderness.  Abdominal:     General: Bowel sounds are normal.     Palpations: Abdomen is soft.     Tenderness: There is no abdominal tenderness. There is no right CVA tenderness, left CVA tenderness, guarding or rebound.  Musculoskeletal:     Cervical back: Neck supple.     Right lower leg: No edema.     Left lower leg: No edema.  Skin:    General: Skin is warm and dry.  Neurological:     General: No focal deficit present.     Mental Status: She is alert and oriented to person, place, and time.  Psychiatric:        Mood and Affect: Mood normal.        Behavior: Behavior normal.   Data Reviewed:  Results are pending, will review when available.  Assessment and Plan: Principal Problem:   Intractable nausea and vomiting In the setting of:   Gastroparesis Observation/MedSurg. Continue IV fluids. Keep n.p.o. for now. Analgesics as needed. Antiemetics as needed. Pantoprazole 40 mg IVP  daily. Follow CBC, CMP and lipase in AM.  Active Problems:   Asthma, chronic Supplemental oxygen as needed. Bronchodilators as needed.    Anxiety Continue buspirone 20 mg p.o. 3 times daily.    Insomnia Continue trazodone and desipramine at bedtime.    Hypocalcemia Recheck calcium and albumin level in AM.    Advance Care Planning:   Code Status: Full  Code   Consults:   Family Communication:   Severity of Illness: The appropriate patient status for this patient is OBSERVATION. Observation status is judged to be reasonable and necessary in order to provide the required intensity of service to ensure the patient's safety. The patient's presenting symptoms, physical exam findings, and initial radiographic and laboratory data in the context of their medical condition is felt to place them at decreased risk for further clinical deterioration. Furthermore, it is anticipated that the patient will be medically stable for discharge from the hospital within 2 midnights of admission.   Author: Reubin Milan, MD 01/02/2023 2:52 PM  For on call review [email or web address].   This document was prepared using Dragon voice recognition software and may contain some unintended transcription errors.

## 2023-01-02 NOTE — ED Triage Notes (Signed)
Patient presents to ED via POV from home. Here with abdominal pain, nausea and vomiting. History of gastroparesis.

## 2023-01-02 NOTE — ED Notes (Signed)
Attempted to discharge patient. Patient states "I can't go home. I'm not going to be able to stay on top of this". Patient requesting to be admitted. Curatolo MD made aware.

## 2023-01-03 DIAGNOSIS — R809 Proteinuria, unspecified: Secondary | ICD-10-CM | POA: Diagnosis present

## 2023-01-03 DIAGNOSIS — Z79899 Other long term (current) drug therapy: Secondary | ICD-10-CM | POA: Diagnosis not present

## 2023-01-03 DIAGNOSIS — Z91013 Allergy to seafood: Secondary | ICD-10-CM | POA: Diagnosis not present

## 2023-01-03 DIAGNOSIS — Z8249 Family history of ischemic heart disease and other diseases of the circulatory system: Secondary | ICD-10-CM | POA: Diagnosis not present

## 2023-01-03 DIAGNOSIS — Z91030 Bee allergy status: Secondary | ICD-10-CM | POA: Diagnosis not present

## 2023-01-03 DIAGNOSIS — D6800 Von Willebrand disease, unspecified: Secondary | ICD-10-CM | POA: Diagnosis present

## 2023-01-03 DIAGNOSIS — R822 Biliuria: Secondary | ICD-10-CM | POA: Diagnosis present

## 2023-01-03 DIAGNOSIS — Z888 Allergy status to other drugs, medicaments and biological substances status: Secondary | ICD-10-CM | POA: Diagnosis not present

## 2023-01-03 DIAGNOSIS — G4700 Insomnia, unspecified: Secondary | ICD-10-CM | POA: Diagnosis present

## 2023-01-03 DIAGNOSIS — Z882 Allergy status to sulfonamides status: Secondary | ICD-10-CM | POA: Diagnosis not present

## 2023-01-03 DIAGNOSIS — G90A Postural orthostatic tachycardia syndrome (POTS): Secondary | ICD-10-CM | POA: Diagnosis present

## 2023-01-03 DIAGNOSIS — R112 Nausea with vomiting, unspecified: Secondary | ICD-10-CM | POA: Diagnosis present

## 2023-01-03 DIAGNOSIS — F32A Depression, unspecified: Secondary | ICD-10-CM | POA: Diagnosis present

## 2023-01-03 DIAGNOSIS — F419 Anxiety disorder, unspecified: Secondary | ICD-10-CM | POA: Diagnosis present

## 2023-01-03 DIAGNOSIS — J45909 Unspecified asthma, uncomplicated: Secondary | ICD-10-CM | POA: Diagnosis present

## 2023-01-03 DIAGNOSIS — K551 Chronic vascular disorders of intestine: Secondary | ICD-10-CM | POA: Diagnosis present

## 2023-01-03 DIAGNOSIS — E242 Drug-induced Cushing's syndrome: Secondary | ICD-10-CM | POA: Diagnosis present

## 2023-01-03 DIAGNOSIS — K861 Other chronic pancreatitis: Secondary | ICD-10-CM | POA: Diagnosis present

## 2023-01-03 DIAGNOSIS — Z9884 Bariatric surgery status: Secondary | ICD-10-CM | POA: Diagnosis not present

## 2023-01-03 DIAGNOSIS — Z91041 Radiographic dye allergy status: Secondary | ICD-10-CM | POA: Diagnosis not present

## 2023-01-03 DIAGNOSIS — K3184 Gastroparesis: Secondary | ICD-10-CM | POA: Diagnosis present

## 2023-01-03 DIAGNOSIS — E876 Hypokalemia: Secondary | ICD-10-CM | POA: Diagnosis not present

## 2023-01-03 LAB — CBC
HCT: 38.1 % (ref 36.0–46.0)
Hemoglobin: 12.1 g/dL (ref 12.0–15.0)
MCH: 31.6 pg (ref 26.0–34.0)
MCHC: 31.8 g/dL (ref 30.0–36.0)
MCV: 99.5 fL (ref 80.0–100.0)
Platelets: 231 10*3/uL (ref 150–400)
RBC: 3.83 MIL/uL — ABNORMAL LOW (ref 3.87–5.11)
RDW: 13.8 % (ref 11.5–15.5)
WBC: 5.9 10*3/uL (ref 4.0–10.5)
nRBC: 0 % (ref 0.0–0.2)

## 2023-01-03 LAB — COMPREHENSIVE METABOLIC PANEL
ALT: 17 U/L (ref 0–44)
AST: 19 U/L (ref 15–41)
Albumin: 4.1 g/dL (ref 3.5–5.0)
Alkaline Phosphatase: 47 U/L (ref 38–126)
Anion gap: 7 (ref 5–15)
BUN: 7 mg/dL (ref 6–20)
CO2: 26 mmol/L (ref 22–32)
Calcium: 8.2 mg/dL — ABNORMAL LOW (ref 8.9–10.3)
Chloride: 104 mmol/L (ref 98–111)
Creatinine, Ser: 0.81 mg/dL (ref 0.44–1.00)
GFR, Estimated: >60 mL/min (ref >60)
Glucose, Bld: 83 mg/dL (ref 70–99)
Potassium: 3.3 mmol/L — ABNORMAL LOW (ref 3.5–5.1)
Sodium: 137 mmol/L (ref 135–145)
Total Bilirubin: 0.5 mg/dL (ref 0.3–1.2)
Total Protein: 7 g/dL (ref 6.5–8.1)

## 2023-01-03 LAB — HIV ANTIBODY (ROUTINE TESTING W REFLEX): HIV Screen 4th Generation wRfx: NONREACTIVE

## 2023-01-03 MED ORDER — POTASSIUM CHLORIDE CRYS ER 20 MEQ PO TBCR
40.0000 meq | EXTENDED_RELEASE_TABLET | Freq: Once | ORAL | Status: AC
  Administered 2023-01-03: 40 meq via ORAL
  Filled 2023-01-03: qty 2

## 2023-01-03 MED ORDER — BETHANECHOL CHLORIDE 10 MG PO TABS
10.0000 mg | ORAL_TABLET | Freq: Four times a day (QID) | ORAL | Status: DC
  Administered 2023-01-03: 10 mg via ORAL
  Filled 2023-01-03 (×2): qty 1

## 2023-01-03 MED ORDER — CALCIUM GLUCONATE-NACL 1-0.675 GM/50ML-% IV SOLN
1.0000 g | Freq: Once | INTRAVENOUS | Status: AC
  Administered 2023-01-03: 1000 mg via INTRAVENOUS
  Filled 2023-01-03: qty 50

## 2023-01-03 NOTE — Progress Notes (Signed)
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  No history of diarrhea.  On presentation, she was hemodynamically stable.  CT abdomen/pelvis with contrast did not show any acute findings or explanation for patient's symptoms.  Patient was admitted for the management of intractable nausea and vomiting likely from gastroparesis.  Assessment & Plan:  Principal Problem:   Intractable nausea and vomiting Active Problems:   Asthma, chronic   Anxiety   Gastroparesis   Insomnia   Hypocalcemia  Intractable nausea/vomiting: Most likely from gastroparesis.  Chronic problem.  No history of diabetes.  But has history of postural orthostatic tachycardia syndrome .  Currently on IV fluids, clear liquid diet.  Continue PPI, antiemetics Abdominal CT did not show any acute findings.  Pregnancy test negative.  UA not suggestive  of UTI. Feels better today but still complains of epigastric abdominal pain and nausea. We will advance the diet as tolerated.  POTS: Follows with cardiology.  On acebutolol, Cardizem, ivabradine  Chronic asthma: Currently not in exacerbation.  Continue bronchodilators as needed  Anxiety: Continue BuSpar.  Also on clonazepam, desipramine, Lamictal  Insomnia: On trazodone, desipramine  Hypocalcemia: Albumin level normal.  Will supplement  Hypokalemia: Supplemented with potassium         DVT prophylaxis:SCDs Start: 01/02/23 1251     Code Status: Full Code  Family Communication:None at bedside   Patient status:Inpatient  Patient is from  :Home  Anticipated discharge NE:[id number]  Estimated DC date:1-2 days   Consultants: None  Procedures:None  Antimicrobials:  Anti-infectives (From admission, onward)    None       Subjective: Patient seen and examined at the bedside today.  Feels better than yesterday but he still has nausea and epigastric abdominal pain.  Hemodynamically stable.  Objective: Vitals:   01/02/23 1625 01/02/23 2004 01/03/23 0031 01/03/23 0515  BP: 135/75 (!) 140/93 123/79 127/78  Pulse: 67 (!) 53 65 68  Resp: (!) '22 20 16 20  '$ Temp: 98.3 F (36.8 C) 97.8 F (36.6 C) 98 F (36.7 C) 97.8 F (36.6 C)  TempSrc: Axillary     SpO2: 98% 100% 99% 100%  Weight:      Height:        Intake/Output Summary (Last 24 hours) at 01/03/2023 0743 Last data filed at 01/03/2023 0500 Gross per 24 hour  Intake 4188.54 ml  Output --  Net 4188.54 ml   Filed Weights   01/02/23 0748  Weight: 84.4 kg    Examination:  General exam: Overall comfortable, not in distress HEENT: PERRL Respiratory system:  no wheezes or crackles  Cardiovascular system: S1 & S2 heard, RRR.  Gastrointestinal system: Abdomen is nondistended, soft and nontender. Central nervous system: Alert and oriented Extremities: No edema, no clubbing ,no cyanosis Skin: No rashes, no ulcers,no icterus     Data Reviewed: I have personally reviewed following labs and imaging studies  CBC: Recent Labs  Lab 01/02/23 0800 01/03/23 0529  WBC 5.9 5.9  NEUTROABS 3.9  --   HGB 13.0 12.1  HCT 39.1 38.1  MCV 94.7 99.5  PLT 265 [id number]   Basic Metabolic Panel: Recent Labs  Lab 01/02/23 0800 01/03/23 0529  NA 136 137  K 3.7  3.3*  CL 103 104  CO2 25 26  GLUCOSE 99 83  BUN 11 7  CREATININE 0.95 0.81  CALCIUM 8.7* 8.2*     No results found for this or any previous visit (from the past 240 hour(s)).   Radiology Studies: CT ABDOMEN PELVIS W CONTRAST  Result Date: 01/02/2023 CLINICAL DATA:  Abdominal pain, acute, nonlocalized. Abdominal  pain with nausea and vomiting since Monday. History of gastro paresis. EXAM: CT ABDOMEN AND PELVIS WITH CONTRAST TECHNIQUE: Multidetector CT imaging of the abdomen and pelvis was performed using the standard protocol following bolus administration of intravenous contrast. RADIATION DOSE REDUCTION: This exam was performed according to the departmental dose-optimization program which includes automated exposure control, adjustment of the mA and/or kV according to patient size and/or use of iterative reconstruction technique. CONTRAST:  133m OMNIPAQUE IOHEXOL 300 MG/ML  SOLN COMPARISON:  Abdominopelvic CT 11/11/2021. FINDINGS: Lower chest: Clear lung bases. No significant pleural or pericardial effusion. Hepatobiliary: The liver is normal in density without suspicious focal abnormality. No significant biliary dilatation or residual pneumobilia status post cholecystectomy. Pancreas: Unremarkable. No pancreatic ductal dilatation or surrounding inflammatory changes. Spleen: Normal in size without focal abnormality. Adrenals/Urinary Tract: Both adrenal glands appear normal. No evidence of urinary tract calculus, suspicious renal lesion or hydronephrosis. The bladder appears unremarkable for its degree of distention. Stomach/Bowel: No enteric contrast administered. There are stable postsurgical changes from previous gastric sleeve resection and appendectomy. No evidence of bowel wall thickening, distention or surrounding inflammation. Vascular/Lymphatic: There are no enlarged abdominal or pelvic lymph nodes. No significant vascular findings. Reproductive: Hysterectomy.  No adnexal mass. Other: Postsurgical changes in the low anterior abdominal wall. No evidence of hernia, ascites or free air. Musculoskeletal: No acute or significant osseous findings. Status post L5-S1 fusion. IMPRESSION: 1. No acute findings or explanation for the patient's symptoms. 2. Stable postsurgical changes as described. Electronically Signed   By:  WRichardean SaleM.D.   On: 01/02/2023 09:37    Scheduled Meds:  acebutolol  200 mg Oral BID   bethanechol  10 mg Oral QID   busPIRone  20 mg Oral TID   desipramine  25 mg Oral QHS   diltiazem  180 mg Oral Daily   ivabradine  7.5 mg Oral BID WC   lamoTRIgine  100 mg Oral QHS   montelukast  10 mg Oral QHS   pantoprazole  40 mg Oral BID   potassium chloride  40 mEq Oral Once   scopolamine  1 patch Transdermal Q72H   Continuous Infusions:  0.9 % NaCl with KCl 20 mEq / L 125 mL/hr at 01/02/23 1900   promethazine (PHENERGAN) injection (IM or IVPB) 25 mg (01/03/23 0700)     LOS: 0 days   AShelly Coss MD [facility] P3/04/2023, 7:43 AM

## 2023-01-03 NOTE — Progress Notes (Signed)
  Transition of Care [facility]) Screening Note   Patient Details  Name: Ariona Kulman Date of Birth: [birth year]   Transition of Care [facility]) CM/SW Contact:    Vassie Moselle, LCSW Phone Number: 01/03/2023, 9:14 AM    Transition of Care Department [facility]) has reviewed patient and no TOC needs have been identified at this time. We will continue to monitor patient advancement through interdisciplinary progression rounds. If new patient transition needs arise, please place a TOC consult.

## 2023-01-04 LAB — BASIC METABOLIC PANEL
Anion gap: 9 (ref 5–15)
BUN: <5 mg/dL — ABNORMAL LOW (ref 6–20)
CO2: 26 mmol/L (ref 22–32)
Calcium: 8.7 mg/dL — ABNORMAL LOW (ref 8.9–10.3)
Chloride: 101 mmol/L (ref 98–111)
Creatinine, Ser: 0.8 mg/dL (ref 0.44–1.00)
GFR, Estimated: >60 mL/min (ref >60)
Glucose, Bld: 99 mg/dL (ref 70–99)
Potassium: 3.8 mmol/L (ref 3.5–5.1)
Sodium: 136 mmol/L (ref 135–145)

## 2023-01-04 NOTE — Discharge Summary (Signed)
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  No history of diarrhea.  On presentation, she was hemodynamically stable.  CT abdomen/pelvis with contrast did not show any acute findings or explanation for patient's symptoms.  Patient was admitted for the management of intractable nausea and vomiting likely from gastroparesis.  Her abdominal pain, nausea, vomiting gradually improved.  Today she has tolerated soft diet.  Medically stable for discharge to home today.  She is planning to follow-up with her gastroenterologist at [facility] as soon as possible.  Following problems were addressed during the hospitalization:  Intractable nausea/vomiting: Most likely from gastroparesis.  Chronic problem.  No history of diabetes.  But has history of postural orthostatic tachycardia syndrome . Treated with  IV fluids, PPI, antiemetics Abdominal CT did not show any acute findings.  Pregnancy test negative.  UA not suggestive  of UTI. Feels better today ,tolerated soft diet She is planning to follow-up with her gastroenterologist at [facility] as soon as possible.  POTS: Follows with cardiology.  On acebutolol, Cardizem, ivabradine   Chronic asthma: Currently not in exacerbation.  Continue bronchodilators as needed   Anxiety: Continue BuSpar.  Also on clonazepam, desipramine, Lamictal   Insomnia: On trazodone,  desipramine   Hypocalcemia: Albumin level normal.  Supplemented   Hypokalemia: Supplemented with potassium   Discharge Diagnoses:  Principal Problem:   Intractable nausea and vomiting Active Problems:   Asthma, chronic   Anxiety   Gastroparesis   Insomnia   Hypocalcemia    Discharge Instructions  Discharge Instructions     Diet general   Complete by: As directed    Discharge instructions   Complete by: As directed    1)Please follow up with your PCP and gastroenterologist as an outpatient   Increase activity slowly   Complete by: As directed       Allergies as of 01/04/2023       Reactions   Bee Venom Anaphylaxis   Reglan [metoclopramide] Other (See Comments)   Reaction:  Oculogyric crisis    Shellfish Allergy Anaphylaxis   Adhesive [tape] Hives   Measles, Mumps & Rubella Vac    Other reaction(s): slept for 28 hrs   Sulfa Antibiotics Rash   Yellow Dye Nausea Only, Rash   Occurred from oral iron with yellow enteric coating Has not tried any other oral iron preparation (so can't rule out any other excipient as causative agent)        Medication List     TAKE these medications    Abilify Maintena 400 MG Srer injection Generic drug: ARIPiprazole ER Inject 400 mg into the muscle every 28 (twenty-eight) days.   acebutolol 200 MG capsule Commonly known as: SECTRAL Take 1 capsule (200 mg total) by mouth 2 (two) times daily.   acetaminophen 500 MG tablet Commonly known as: TYLENOL Take 2 tablets (1,000 mg total) by mouth every 8 (eight) hours.   albuterol 108 (90 Base) MCG/ACT inhaler Commonly known as: VENTOLIN HFA Inhale 1 puff into the lungs as needed for wheezing or shortness of breath.   Ambien CR  12.5 MG CR tablet Generic drug: zolpidem Take 12.5 mg by mouth at bedtime.   busPIRone 10 MG tablet Commonly known as: BUSPAR Take 20 mg by mouth 3 (three) times daily.   clonazePAM 0.5 MG tablet Commonly known as: KLONOPIN Take 0.5 mg by mouth 2  (two) times daily as needed for anxiety.   cyanocobalamin 1000 MCG/ML injection Commonly known as: VITAMIN B12 Inject 1,000 mcg into the skin every 30 (thirty) days.   desipramine 25 MG tablet Commonly known as: NORPRAMIN Take 25 mg by mouth at bedtime.   Dexilant 30 MG capsule DR Generic drug: Dexlansoprazole Take 30 mg by mouth 2 (two) times daily.   diltiazem 180 MG 24 hr capsule Commonly known as: CARDIZEM CD TAKE ONE CAPSULE BY MOUTH EVERY DAY   EPINEPHrine 0.3 mg/0.3 mL Soaj injection Commonly known as: EPI-PEN Inject 0.3 mg into the muscle as needed for anaphylaxis.   ivabradine 7.5 MG Tabs tablet Commonly known as: Corlanor Take 1 tablet (7.5 mg total) by mouth 2 (two) times daily with a meal.   lamoTRIgine 100 MG tablet Commonly known as: LAMICTAL Take 100 mg by mouth at bedtime.   levalbuterol 0.63 MG/3ML nebulizer solution Commonly known as: XOPENEX Take 0.63 mg by nebulization every 6 (six) hours as needed for wheezing or shortness of breath.   levalbuterol 45 MCG/ACT inhaler Commonly known as: XOPENEX HFA Inhale 1 puff into the lungs as needed for wheezing or shortness of breath.   montelukast 10 MG tablet Commonly known as: SINGULAIR Take 10 mg by mouth at bedtime.   ondansetron 4 MG disintegrating tablet Commonly known as: ZOFRAN-ODT Take 4 mg by mouth every 8 (eight) hours as needed for nausea or vomiting. What changed: Another medication with the same name was changed. Make sure you understand how and when to take each.   ondansetron 4 MG disintegrating tablet Commonly known as: ZOFRAN-ODT Take 1 tablet (4 mg total) by mouth every 8 (eight) hours as needed for nausea or vomiting. What changed:  medication strength how much to take how to take this when to take this reasons to take this additional instructions   promethazine 25 MG suppository Commonly known as: PHENERGAN Place 25 mg rectally every 6 (six) hours as needed for nausea. Use for  up to 7 days.   traZODone 100 MG tablet Commonly known as: DESYREL Take 100-300 mg by mouth at bedtime as needed for sleep.        Follow-up Information     Bonnita Hollow, MD .   Specialty: Family Medicine Contact information: [place] [address] [facility] .   Specialty: Emergency Medicine Contact information: [address] [id number] [place] [address] [place] [address] [phone]               Allergies  Allergen Reactions   Bee Venom Anaphylaxis   Reglan [Metoclopramide] Other (See Comments)    Reaction:  Oculogyric crisis    Shellfish Allergy Anaphylaxis   Adhesive [Tape] Hives   Measles, Mumps & Rubella Vac     Other reaction(s): slept for 28 hrs   Sulfa Antibiotics Rash   Yellow Dye Nausea Only and Rash    Occurred from oral iron with yellow enteric coating Has not tried any other oral iron preparation (so can't rule out any other excipient as causative agent)    Consultations: None   Procedures/Studies:  CT ABDOMEN PELVIS W CONTRAST  Result Date: 01/02/2023 CLINICAL DATA:  Abdominal pain, acute, nonlocalized. Abdominal pain with nausea and vomiting since Monday. History of gastro paresis. EXAM: CT ABDOMEN AND PELVIS WITH CONTRAST TECHNIQUE: Multidetector CT imaging of the abdomen and pelvis was performed using the standard protocol following bolus administration of intravenous contrast. RADIATION DOSE REDUCTION: This exam was performed according to the departmental dose-optimization program which includes automated exposure control, adjustment of the mA and/or kV according to patient size and/or use of iterative reconstruction technique. CONTRAST:  161m OMNIPAQUE IOHEXOL 300 MG/ML  SOLN COMPARISON:  Abdominopelvic CT 11/11/2021. FINDINGS: Lower chest: Clear lung bases. No significant pleural or pericardial effusion. Hepatobiliary: The liver is  normal in density without suspicious focal abnormality. No significant biliary dilatation or residual pneumobilia status post cholecystectomy. Pancreas: Unremarkable. No pancreatic ductal dilatation or surrounding inflammatory changes. Spleen: Normal in size without focal abnormality. Adrenals/Urinary Tract: Both adrenal glands appear normal. No evidence of urinary tract calculus, suspicious renal lesion or hydronephrosis. The bladder appears unremarkable for its degree of distention. Stomach/Bowel: No enteric contrast administered. There are stable postsurgical changes from previous gastric sleeve resection and appendectomy. No evidence of bowel wall thickening, distention or surrounding inflammation. Vascular/Lymphatic: There are no enlarged abdominal or pelvic lymph nodes. No significant vascular findings. Reproductive: Hysterectomy.  No adnexal mass. Other: Postsurgical changes in the low anterior abdominal wall. No evidence of hernia, ascites or free air. Musculoskeletal: No acute or significant osseous findings. Status post L5-S1 fusion. IMPRESSION: 1. No acute findings or explanation for the patient's symptoms. 2. Stable postsurgical changes as described. Electronically Signed   By: WRichardean SaleM.D.   On: 01/02/2023 09:37   DG Lumbar Spine 2-3 Views  Result Date: 12/13/2022 CLINICAL DATA:  Status post fall.  Prior fusion at L5-S1. EXAM: LUMBAR SPINE - 2-3 VIEW COMPARISON:  MRI of lumbar spine May 14 2022 FINDINGS: Prior posterior fusion of L5-S1 without malalignment. Mild decreased intervertebral space identified at L4-5. There is no evidence of lumbar spine fracture. Alignment is normal. IMPRESSION: Prior posterior fusion of L5-S1 without malalignment. Mild degenerative joint changes at L4-5. Electronically Signed   By: WAbelardo DieselM.D.   On: 12/13/2022 08:25      Subjective: Patient seen and examined the bedside this morning.  Hemodynamically stable.  Feels better.  Tolerating soft diet this  afternoon.  Feels ready to go home.  Discharge Exam: Vitals:   01/04/23 0600 01/04/23 0902  BP: (!) 103/51 123/74  Pulse: 65   Resp: 17   Temp: 97.7 F (36.5 C)   SpO2:     Vitals:   01/03/23 1336 01/03/23 2011 01/04/23 0600 01/04/23 0902  BP: 136/77 111/74 (!) 103/51 123/74  Pulse: 68 64 65   Resp: '16 18 17   '$ Temp: 98.3 F (36.8 C) 99 F (37.2 C) 97.7 F (36.5 C)   TempSrc:      SpO2: 100% 97%    Weight:      Height:        General: Pt is alert, awake, not in acute distress Cardiovascular: RRR, S1/S2 +, no rubs, no gallops Respiratory: CTA bilaterally, no wheezing, no rhonchi Abdominal: Soft, NT, ND, bowel sounds + Extremities: no edema, no cyanosis    The results of significant diagnostics from this hospitalization (including imaging, microbiology, ancillary and laboratory) are listed below for reference.     Microbiology: No results found for this or any previous visit (from the past 240 hour(s)).   Labs:  BNP (last 3 results) No results for input(s): "BNP" in the last 8760 hours. Basic Metabolic Panel: Recent Labs  Lab 01/02/23 0800 01/03/23 0529 01/04/23 0555  NA 136 137 136  K 3.7 3.3* 3.8  CL 103 104 101  CO2 '25 26 26  '$ GLUCOSE 99 83 99  BUN 11 7 <5*  CREATININE 0.95 0.81 0.80  CALCIUM 8.7* 8.2* 8.7*   Liver Function Tests: Recent Labs  Lab 01/02/23 0800 01/03/23 0529  AST 25 19  ALT 20 17  ALKPHOS 58 47  BILITOT 0.7 0.5  PROT 8.0 7.0  ALBUMIN 4.3 4.1   Recent Labs  Lab 01/02/23 0800  LIPASE 25   No results for input(s): "AMMONIA" in the last 168 hours. CBC: Recent Labs  Lab 01/02/23 0800 01/03/23 0529  WBC 5.9 5.9  NEUTROABS 3.9  --   HGB 13.0 12.1  HCT 39.1 38.1  MCV 94.7 99.5  PLT 265 231   Cardiac Enzymes: No results for input(s): "CKTOTAL", "CKMB", "CKMBINDEX", "TROPONINI" in the last 168 hours. BNP: Invalid input(s): "POCBNP" CBG: No results for input(s): "GLUCAP" in the last 168 hours. D-Dimer No results for  input(s): "DDIMER" in the last 72 hours. Hgb A1c No results for input(s): "HGBA1C" in the last 72 hours. Lipid Profile No results for input(s): "CHOL", "HDL", "LDLCALC", "TRIG", "CHOLHDL", "LDLDIRECT" in the last 72 hours. Thyroid function studies No results for input(s): "TSH", "T4TOTAL", "T3FREE", "THYROIDAB" in the last 72 hours.  Invalid input(s): "FREET3" Anemia work up No results for input(s): "VITAMINB12", "FOLATE", "FERRITIN", "TIBC", "IRON", "RETICCTPCT" in the last 72 hours. Urinalysis    Component Value Date/Time   COLORURINE YELLOW 01/02/2023 0754   APPEARANCEUR CLEAR 01/02/2023 0754   LABSPEC >=1.030 01/02/2023 0754   PHURINE 5.5 01/02/2023 0754   GLUCOSEU NEGATIVE 01/02/2023 0754   HGBUR NEGATIVE 01/02/2023 0754   BILIRUBINUR SMALL (A) 01/02/2023 0754   KETONESUR NEGATIVE 01/02/2023 0754   PROTEINUR 30 (A) 01/02/2023 0754   UROBILINOGEN 0.2 02/05/2015 1855   NITRITE NEGATIVE 01/02/2023 0754   LEUKOCYTESUR NEGATIVE 01/02/2023 0754   Sepsis Labs Recent Labs  Lab 01/02/23 0800 01/03/23 0529  WBC 5.9 5.9   Microbiology No results found for this or any previous visit (from the past 240 hour(s)).  Please note: You were cared for by a hospitalist during your hospital stay. Once you are discharged, your primary care physician will handle any further medical issues. Please note that NO REFILLS for any discharge medications will be authorized once you are discharged, as it is imperative that you return to your primary care physician (or establish a relationship with a primary care physician if you do not have one) for your post hospital discharge needs so that they can reassess your need for medications and monitor your lab values.    Time coordinating discharge: 40 minutes  SIGNED:   Shelly Coss, MD  [facility] 01/04/2023, 1:12 PM Pager [phone]  If 7PM-7AM, please contact night-coverage [email or web address] Password [email or web address]

## 2023-01-17 DIAGNOSIS — F411 Generalized anxiety disorder: Secondary | ICD-10-CM | POA: Diagnosis not present

## 2023-01-17 DIAGNOSIS — F331 Major depressive disorder, recurrent, moderate: Secondary | ICD-10-CM | POA: Diagnosis not present

## 2023-01-17 DIAGNOSIS — F605 Obsessive-compulsive personality disorder: Secondary | ICD-10-CM | POA: Diagnosis not present

## 2023-01-24 ENCOUNTER — Telehealth: Payer: BC Managed Care – PPO | Admitting: Physician Assistant

## 2023-01-24 DIAGNOSIS — R3989 Other symptoms and signs involving the genitourinary system: Secondary | ICD-10-CM

## 2023-01-24 MED ORDER — CEPHALEXIN 500 MG PO CAPS: 500.0000 mg | ORAL_CAPSULE | Freq: Two times a day (BID) | ORAL | 0 refills | Status: AC

## 2023-01-24 NOTE — Patient Instructions (Signed)
Kaitlyn Johnson, thank you for joining Leeanne Rio, PA-C for today's virtual visit.  While this provider is not your primary care provider (PCP), if your PCP is located in our provider database this encounter information will be shared with them immediately following your visit.   [facility] account gives you access to today's visit and all your visits, tests, and labs performed at [facility] " click here if you don't have a [facility] account or go to [email or web address]  Consent: (Patient) Kaitlyn Johnson provided verbal consent for this virtual visit at the beginning of the encounter.  Current Medications:  Current Outpatient Medications:    ABILIFY MAINTENA 400 MG SRER injection, Inject 400 mg into the muscle every 28 (twenty-eight) days., Disp: , Rfl:    acebutolol (SECTRAL) 200 MG capsule, Take 1 capsule (200 mg total) by mouth 2 (two) times daily., Disp: 180 capsule, Rfl: 3   acetaminophen (TYLENOL) 500 MG tablet, Take 2 tablets (1,000 mg total) by mouth every 8 (eight) hours. (Patient not taking: Reported on 01/02/2023), Disp: 30 tablet, Rfl: 0   albuterol (VENTOLIN HFA) 108 (90 Base) MCG/ACT inhaler, Inhale 1 puff into the lungs as needed for wheezing or shortness of breath., Disp: , Rfl:    AMBIEN CR 12.5 MG CR tablet, Take 12.5 mg by mouth at bedtime., Disp: , Rfl: 1   busPIRone (BUSPAR) 10 MG tablet, Take 20 mg by mouth 3 (three) times daily., Disp: , Rfl:    clonazePAM (KLONOPIN) 0.5 MG tablet, Take 0.5 mg by mouth 2 (two) times daily as needed for anxiety., Disp: , Rfl:    cyanocobalamin (VITAMIN B12) 1000 MCG/ML injection, Inject 1,000 mcg into the skin every 30 (thirty) days., Disp: , Rfl:    desipramine (NORPRAMIN) 25 MG tablet, Take 25 mg by mouth at bedtime., Disp: , Rfl:    DEXILANT 30 MG capsule, Take 30 mg by mouth 2 (two) times daily., Disp: , Rfl:    diltiazem (CARDIZEM CD) 180 MG 24 hr capsule, TAKE ONE CAPSULE  BY MOUTH EVERY DAY, Disp: 90 capsule, Rfl: 3   EPINEPHrine 0.3 mg/0.3 mL IJ SOAJ injection, Inject 0.3 mg into the muscle as needed for anaphylaxis., Disp: , Rfl:    ivabradine (CORLANOR) 7.5 MG TABS tablet, Take 1 tablet (7.5 mg total) by mouth 2 (two) times daily with a meal., Disp: 180 tablet, Rfl: 1   lamoTRIgine (LAMICTAL) 100 MG tablet, Take 100 mg by mouth at bedtime., Disp: , Rfl:    levalbuterol (XOPENEX HFA) 45 MCG/ACT inhaler, Inhale 1 puff into the lungs as needed for wheezing or shortness of breath., Disp: , Rfl:    levalbuterol (XOPENEX) 0.63 MG/3ML nebulizer solution, Take 0.63 mg by nebulization every 6 (six) hours as needed for wheezing or shortness of breath., Disp: , Rfl:    montelukast (SINGULAIR) 10 MG tablet, Take 10 mg by mouth at bedtime., Disp: , Rfl:    ondansetron (ZOFRAN-ODT) 4 MG disintegrating tablet, Take 1 tablet (4 mg total) by mouth every 8 (eight) hours as needed for nausea or vomiting., Disp: 20 tablet, Rfl: 0   ondansetron (ZOFRAN-ODT) 4 MG disintegrating tablet, Take 4 mg by mouth every 8 (eight) hours as needed for nausea or vomiting., Disp: , Rfl:    traZODone (DESYREL) 100 MG tablet, Take 100-300 mg by mouth at bedtime as needed for sleep., Disp: , Rfl:    Medications ordered in this encounter:  No orders of the defined types were  placed in this encounter.    *If you need refills on other medications prior to your next appointment, please contact your pharmacy*  Follow-Up: Call back or seek an in-person evaluation if the symptoms worsen or if the condition fails to improve as anticipated.  [place] [phone]  Other Instructions Your symptoms are consistent with a bladder infection, also called acute cystitis. Please take your antibiotic (Keflex) as directed until all pills are gone.  Stay very well hydrated.  Consider a daily probiotic (Align, Culturelle, or Activia) to help prevent stomach upset caused by the antibiotic.  Taking a  probiotic daily may also help prevent recurrent UTIs.  Also consider taking AZO (Phenazopyridine) tablets to help decrease pain with urination.     Urinary Tract Infection A urinary tract infection (UTI) can occur any place along the urinary tract. The tract includes the kidneys, ureters, bladder, and urethra. A type of germ called bacteria often causes a UTI. UTIs are often helped with antibiotic medicine.  HOME CARE  If given, take antibiotics as told by your doctor. Finish them even if you start to feel better. Drink enough fluids to keep your pee (urine) clear or pale yellow. Avoid tea, drinks with caffeine, and bubbly (carbonated) drinks. Pee often. Avoid holding your pee in for a long time. Pee before and after having sex (intercourse). Wipe from front to back after you poop (bowel movement) if you are a woman. Use each tissue only once. GET HELP RIGHT AWAY IF:  You have back pain. You have lower belly (abdominal) pain. You have chills. You feel sick to your stomach (nauseous). You throw up (vomit). Your burning or discomfort with peeing does not go away. You have a fever. Your symptoms are not better in 3 days. MAKE SURE YOU:  Understand these instructions. Will watch your condition. Will get help right away if you are not doing well or get worse. Document Released: 04/02/2008 Document Revised: 07/09/2012 Document Reviewed: 05/15/2012 [facility] Patient Information 2015 [place], [place]. This information is not intended to replace advice given to you by your health care provider. Make sure you discuss any questions you have with your health care provider.    If you have been instructed to have an in-person evaluation today at a local Urgent Care facility, please use the link below. It will take you to a list of all of our available [facility], including address, phone number and hours of operation. Please do not delay care.  [facility]  If you or a  family member do not have a primary care provider, use the link below to schedule a visit and establish care. When you choose a [facility] primary care physician or advanced practice provider, you gain a long-term partner in health. Find a Primary Care Provider  Learn more about [facility]'s in-office and virtual care options: [facility]

## 2023-01-24 NOTE — Progress Notes (Signed)
Virtual Visit Consent   Kaitlyn Johnson, you are scheduled for a virtual visit with a [facility] provider today. Just as with appointments in the office, your consent must be obtained to participate. Your consent will be active for this visit and any virtual visit you may have with one of our providers in the next 365 days. If you have a MyChart account, a copy of this consent can be sent to you electronically.  As this is a virtual visit, video technology does not allow for your provider to perform a traditional examination. This may limit your provider's ability to fully assess your condition. If your provider identifies any concerns that need to be evaluated in person or the need to arrange testing (such as labs, EKG, etc.), we will make arrangements to do so. Although advances in technology are sophisticated, we cannot ensure that it will always work on either your end or our end. If the connection with a video visit is poor, the visit may have to be switched to a telephone visit. With either a video or telephone visit, we are not always able to ensure that we have a secure connection.  By engaging in this virtual visit, you consent to the provision of healthcare and authorize for your insurance to be billed (if applicable) for the services provided during this visit. Depending on your insurance coverage, you may receive a charge related to this service.  I need to obtain your verbal consent now. Are you willing to proceed with your visit today? Kaitlyn Johnson has provided verbal consent on 01/24/2023 for a virtual visit (video or telephone). Kaitlyn Johnson, [place]  Date: 01/24/2023 8:30 AM  Virtual Visit via Video Note   I, Kaitlyn Johnson, connected with  Kaitlyn Johnson  ([id number], [birth year]) on 01/24/23 at  8:30 AM EDT by a video-enabled telemedicine application and verified that I am speaking with the correct person using two identifiers.  Location: Patient:  Virtual Visit Location Patient: Home Provider: Virtual Visit Location Provider: Home Office   I discussed the limitations of evaluation and management by telemedicine and the availability of in person appointments. The patient expressed understanding and agreed to proceed.    History of Present Illness: Kaitlyn Johnson is a 42 y.o. who identifies as a female who was assigned female at birth, and is being seen today for possible UTI. Notes symptoms starting overnight with dysuria, urgency, frequency and suprapubic pressure. Denies fever, chills. Denies hematuria. Denies flank pain, belly pain. Denies nausea/vomiting. Denies vaginal symptoms. S/p hysterectomy.   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   Arthropathy of lumbar facet joint 07/30/2016   HNP (herniated nucleus pulposus), lumbar 06/22/2016   Hypomagnesemia 11/25/2013   Anemia of chronic disease 11/23/2013   Endometriosis 09/01/2013   Celiac disease 09/01/2013   Asthma, chronic 09/01/2013   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   Abnormal LFTs 07/15/2012   POTS (postural orthostatic tachycardia syndrome) 11/26/2011    Allergies:  Allergies  Allergen Reactions   Bee Venom Anaphylaxis   Reglan [Metoclopramide] Other (See Comments)    Reaction:  Oculogyric crisis     Shellfish Allergy Anaphylaxis   Adhesive [Tape] Hives   Measles, Mumps & Rubella Vac     Other reaction(s): slept for 28 hrs   Sulfa Antibiotics Rash   Yellow Dye Nausea Only and Rash    Occurred from oral iron with yellow enteric coating Has not tried any other oral iron preparation (so can't rule out any other excipient as causative agent)   Medications:  Current Outpatient Medications:    ABILIFY MAINTENA 400 MG SRER injection, Inject 400 mg into the muscle every 28 (twenty-eight) days., Disp: , Rfl:    acebutolol (SECTRAL) 200 MG capsule, Take 1 capsule (200 mg total) by mouth 2 (two) times daily., Disp: 180 capsule, Rfl: 3   acetaminophen (TYLENOL) 500 MG tablet, Take 2 tablets (1,000 mg total) by mouth every 8 (eight) hours. (Patient not taking: Reported on 01/02/2023), Disp: 30 tablet, Rfl: 0   albuterol (VENTOLIN HFA) 108 (90 Base) MCG/ACT inhaler, Inhale 1 puff into the lungs as needed for wheezing or shortness of breath., Disp: , Rfl:    AMBIEN CR 12.5 MG CR tablet, Take 12.5 mg by mouth at bedtime., Disp: , Rfl: 1   busPIRone (BUSPAR) 10 MG tablet, Take 20 mg by mouth 3 (three) times daily., Disp: , Rfl:    clonazePAM (KLONOPIN) 0.5 MG tablet, Take 0.5 mg by mouth 2 (two) times daily as needed for anxiety., Disp: , Rfl:    cyanocobalamin (VITAMIN B12) 1000 MCG/ML injection, Inject 1,000 mcg into the skin every 30 (thirty) days., Disp: , Rfl:    desipramine (NORPRAMIN) 25 MG tablet, Take 25 mg by mouth at bedtime., Disp: , Rfl:    DEXILANT 30 MG capsule, Take 30 mg by mouth 2 (two) times daily., Disp: , Rfl:    diltiazem (CARDIZEM CD) 180 MG 24 hr capsule, TAKE ONE CAPSULE BY MOUTH EVERY DAY, Disp: 90 capsule, Rfl: 3   EPINEPHrine 0.3 mg/0.3 mL IJ SOAJ injection, Inject 0.3 mg into the muscle as needed for anaphylaxis., Disp: , Rfl:    ivabradine (CORLANOR) 7.5 MG TABS tablet, Take 1 tablet (7.5 mg total) by mouth 2 (two) times daily with a meal., Disp: 180 tablet, Rfl: 1    lamoTRIgine (LAMICTAL) 100 MG tablet, Take 100 mg by mouth at bedtime., Disp: , Rfl:    levalbuterol (XOPENEX HFA) 45 MCG/ACT inhaler, Inhale 1 puff into the lungs as needed for wheezing or shortness of breath., Disp: , Rfl:    levalbuterol (XOPENEX) 0.63 MG/3ML nebulizer solution, Take 0.63 mg by nebulization every 6 (six) hours as needed for wheezing or shortness of breath., Disp: , Rfl:    montelukast (SINGULAIR) 10 MG tablet, Take 10 mg by mouth at bedtime., Disp: , Rfl:    ondansetron (ZOFRAN-ODT) 4 MG disintegrating tablet, Take 1 tablet (4 mg total) by mouth every 8 (eight) hours as needed for nausea or vomiting., Disp: 20 tablet, Rfl: 0   ondansetron (ZOFRAN-ODT) 4 MG disintegrating tablet, Take 4 mg by mouth every 8 (eight) hours as needed for nausea or vomiting., Disp: , Rfl:    traZODone (DESYREL) 100 MG tablet, Take 100-300 mg by mouth at bedtime as needed for sleep., Disp: , Rfl:   Observations/Objective: Patient is well-developed, well-nourished  in no acute distress.  Resting comfortably at home.  Head is normocephalic, atraumatic.  No labored breathing. Speech is clear and coherent with logical content.  Patient is alert and oriented at baseline.   Assessment and Plan: There are no diagnoses linked to this encounter. Classic UTI symptoms with absence of alarm signs or symptoms. Prior history of UTI. Will treat empirically with Keflex for suspected uncomplicated cystitis. Supportive measures and OTC medications reviewed. Strict in-person evaluation precautions discussed.    Follow Up Instructions: I discussed the assessment and treatment plan with the patient. The patient was provided an opportunity to ask questions and all were answered. The patient agreed with the plan and demonstrated an understanding of the instructions.  A copy of instructions were sent to the patient via MyChart unless otherwise noted below.   The patient was advised to call back or seek an in-person  evaluation if the symptoms worsen or if the condition fails to improve as anticipated.  Time:  I spent 10 minutes with the patient via telehealth technology discussing the above problems/concerns.    Kaitlyn Rio, PA-C

## 2023-02-13 ENCOUNTER — Telehealth: Payer: Self-pay

## 2023-02-13 MED ORDER — MONTELUKAST SODIUM 10 MG PO TABS: 10.0000 mg | ORAL_TABLET | Freq: Every day | ORAL | 3 refills | Status: DC

## 2023-02-13 NOTE — Telephone Encounter (Signed)
Chart supports rx. Last OV: 12/14/2022

## 2023-02-18 ENCOUNTER — Encounter: Payer: Self-pay | Admitting: Cardiology

## 2023-02-18 ENCOUNTER — Encounter: Payer: Self-pay | Admitting: Family Medicine

## 2023-02-18 NOTE — Telephone Encounter (Signed)
From patient.

## 2023-02-27 ENCOUNTER — Encounter: Payer: Self-pay | Admitting: Family Medicine

## 2023-02-27 ENCOUNTER — Ambulatory Visit (INDEPENDENT_AMBULATORY_CARE_PROVIDER_SITE_OTHER): Payer: BC Managed Care – PPO | Admitting: Family Medicine

## 2023-02-27 VITALS — BP 122/82 | HR 85 | Temp 97.5°F | Wt 194.8 lb

## 2023-02-27 DIAGNOSIS — Z9884 Bariatric surgery status: Secondary | ICD-10-CM

## 2023-02-27 DIAGNOSIS — G90A Postural orthostatic tachycardia syndrome (POTS): Secondary | ICD-10-CM | POA: Diagnosis not present

## 2023-02-27 DIAGNOSIS — K3184 Gastroparesis: Secondary | ICD-10-CM

## 2023-02-27 DIAGNOSIS — Z01818 Encounter for other preprocedural examination: Secondary | ICD-10-CM

## 2023-02-27 DIAGNOSIS — Z8639 Personal history of other endocrine, nutritional and metabolic disease: Secondary | ICD-10-CM | POA: Diagnosis not present

## 2023-02-27 DIAGNOSIS — J4520 Mild intermittent asthma, uncomplicated: Secondary | ICD-10-CM

## 2023-02-27 DIAGNOSIS — F419 Anxiety disorder, unspecified: Secondary | ICD-10-CM

## 2023-02-27 DIAGNOSIS — Y69 Unspecified misadventure during surgical and medical care: Secondary | ICD-10-CM | POA: Insufficient documentation

## 2023-02-27 NOTE — Assessment & Plan Note (Signed)
Follows with cardiology.  Has received surgical clearance.

## 2023-02-27 NOTE — Assessment & Plan Note (Signed)
Average or below risk as per the [employer] of surgeon calculator for liposuction. Medically optimized regarding medical conditions discussed at today's visit and/or specifically requested per surgeon, Dr. [facility]  Recommend that patient may proceed with surgery, Pending normal workup of labs as ordered

## 2023-02-27 NOTE — Assessment & Plan Note (Signed)
On multiple mood agents.  Will require psychiatric clearance.

## 2023-02-27 NOTE — Progress Notes (Unsigned)
Assessment/Plan:  Total time spent caring for the patient today was 200 minutes. This includes time spent before the visit reviewing the chart, time spent during the visit, and time spent after the visit on documentation, etc.  Problem List Items Addressed This Visit       Cardiovascular and Mediastinum   POTS (postural orthostatic tachycardia syndrome)    Follows with cardiology.  Has received surgical clearance.        Respiratory   Asthma, chronic     Digestive   Gastroparesis    Improved symptoms with tolerance to fluids, no recent vomiting.  Plan: Continue current management with upcoming gastroenterology evaluation.        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  Will require psychiatric clearance.      Pre-op exam - Primary    Average or below risk as per the [employer] of surgeon calculator for liposuction. Medically optimized regarding medical conditions discussed at today's visit and/or specifically requested per surgeon, Dr. [facility]  Recommend that patient may proceed with surgery, Pending normal workup of labs as ordered      Relevant Orders   CBC w/Diff   Comp Met (CMET)   Hemoglobin A1C   Protime-INR   APTT   History of Cushing's syndrome    Stable without medications and without any ongoing symptoms of endocrine dysfunction Address preoperative steroid use with anesthesia if indicated due to historical Cushing's Syndrome.      Wrongful diagnosis of von Willebrand's disease    The patient, with a previous negative hypocoagulability workup in 2014. Laboratory findings were predominantly normal, including clotting factors including von Willebrand's and platelet function tests. However, the patient did have an elevated D-dimer level of 0.88 mcg/mL FEU and a prolonged bleeding time of 15.0 and 12.0 minutes.    Plan to reassess platelet count and PT/INR and a PTT.  If any  abnormalities observed, can consider referral to hematology for further evaluation.       There are no discontinued medications.  Return in about 1 week (around 03/06/2023) for fasting labs.    Subjective:   Encounter date: 02/27/2023  Kaitlyn Johnson is a 42 y.o. 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   She  has a past medical history of Acute appendicitis (02/28/2017), Acute pancreatitis (11/30/2013), Anemia (11/26/2011), Anxiety, Asthma, Celiac and mesenteric artery injury, Cushing's syndrome (HCC), Depression, H/O hiatal hernia, History of kidney stones, Iatrogenic Cushing's syndrome (HCC) (10/02/2013), Nausea vomiting and diarrhea (11/23/2013), Nausea with vomiting (11/30/2013), Palpitations (12/05/2018), Pancreatitis (11/29/2013), POTS (postural orthostatic tachycardia syndrome), Shortness of breath dyspnea, and Sphincter of Oddi dysfunction..   CHIEF COMPLAINT: The patient presents for medical clearance for liposuction scheduled for 03/12/2023.  HISTORY OF PRESENT ILLNESS:  Gastroparesis: The patient reports an episode of nausea and vomiting, which she suspected to be a "stomach bug", occurring after the last  office  visit. There was no other significant change in the condition, and the patient is currently tolerating fluids well and has an upcoming gastroenterology appointment.  History of Cushing's Syndrome secondary to chronic steroid use.  Previously assessed by endocrinology for Cushing's Syndrome with no ongoing endocrine dysfunction or diabetes. The patient is not on any medications for ongoing suppression.  POTS: The patient is currently on ivabradine for POTS, with stable symptoms. The patient has received cardiological clearance for upcoming surgery.  Mood Symptoms: The patient is on multiple mood agents, with a pending psychiatric clearance.  Preoperative Concerns: A discussion regarding preoperative steroids was conducted due to the history of Cushing's Syndrome, with an understanding that a preoperative dose of steroids may be provided by the anesthesia team if necessary.  von Willebrand's Disease: A previous diagnosis of von Willebrand's Disease was discussed, with clarification that this was a wrongful diagnosis and the patient has no hypocoagulability based on previous lab work.   Review of Systems  Respiratory:  Negative for shortness of breath and wheezing.   Cardiovascular:  Negative for chest pain and palpitations.  Gastrointestinal:  Negative for nausea and vomiting.    Past Surgical History:  Procedure Laterality Date   ABDOMINAL HYSTERECTOMY  08/2010   ANTERIOR CERVICAL DECOMP/DISCECTOMY FUSION N/A 03/31/2013   Procedure: ANTERIOR CERVICAL DECOMPRESSION/DISCECTOMY FUSION 1 LEVEL Cervical five-six;  Surgeon: Reinaldo Meeker, MD;  Location: [facility];  Service: Neurosurgery;  Laterality: N/A;   APPENDECTOMY     BACK SURGERY     Cholangio-Pancreatography with Spintectorotomy + Stent  07/16/2012   01/15/14   CHOLECYSTECTOMY     ESOPHAGOGASTRODUODENOSCOPY (EGD) WITH PROPOFOL N/A 08/19/2018   Procedure: ESOPHAGOGASTRODUODENOSCOPY (EGD) WITH PROPOFOL;  Surgeon: Charlott Rakes,  MD;  Location: [facility];  Service: Endoscopy;  Laterality: N/A;   LAPAROSCOPIC APPENDECTOMY N/A 02/28/2017   Procedure: APPENDECTOMY LAPAROSCOPIC;  Surgeon: Harriette Bouillon, MD;  Location: [facility];  Service: General;  Laterality: N/A;   LAPAROSCOPIC ENDOMETRIOSIS FULGURATION     LAPAROSCOPIC GASTRIC SLEEVE RESECTION N/A 07/15/2018   Procedure: LAPAROSCOPIC GASTRIC SLEEVE RESECTION, UPPER ENDO, ERAS Pathway;  Surgeon: Luretha Mesina, MD;  Location: [facility];  Service: General;  Laterality: N/A;   LUMBAR LAMINECTOMY     LUMBAR LAMINECTOMY/DECOMPRESSION MICRODISCECTOMY Left 06/22/2016   Procedure: MICRODISCECTOMY LEFT LUMBAR FOUR-FIVE;  Surgeon: Lisbeth Renshaw, MD;  Location: [facility];  Service: Neurosurgery;  Laterality: Left;   ROUX-EN-Y PROCEDURE  04/1999    Outpatient Medications Prior to Visit  Medication Sig Dispense Refill   ABILIFY MAINTENA 400 MG SRER injection Inject 400 mg into the muscle every 28 (twenty-eight) days.     acebutolol (SECTRAL) 200 MG capsule Take 1 capsule (200 mg total) by mouth 2 (two) times daily. 180 capsule 3   albuterol (VENTOLIN HFA) 108 (90 Base) MCG/ACT inhaler Inhale 1 puff into the lungs as needed for wheezing or shortness of breath.     AMBIEN CR 12.5 MG CR tablet Take 12.5 mg by mouth at bedtime.  1   busPIRone (BUSPAR) 10 MG tablet Take 20 mg by mouth 3 (three) times daily.     clonazePAM (KLONOPIN) 0.5 MG tablet Take 0.5 mg by mouth 2 (two) times daily as needed for anxiety.     cyanocobalamin (VITAMIN B12) 1000 MCG/ML injection Inject 1,000 mcg into the skin every 30 (thirty) days.     desipramine (NORPRAMIN) 25 MG tablet Take 25 mg by mouth at bedtime.     DEXILANT 30 MG capsule Take 30  mg by mouth 2 (two) times daily.     diltiazem (CARDIZEM CD) 180 MG 24 hr capsule TAKE ONE CAPSULE BY MOUTH EVERY DAY 90 capsule 3   EPINEPHrine 0.3 mg/0.3 mL IJ SOAJ injection Inject 0.3 mg into the muscle as needed for anaphylaxis.     ivabradine (CORLANOR)  7.5 MG TABS tablet Take 1 tablet (7.5 mg total) by mouth 2 (two) times daily with a meal. 180 tablet 1   lamoTRIgine (LAMICTAL) 100 MG tablet Take 100 mg by mouth at bedtime.     levalbuterol (XOPENEX HFA) 45 MCG/ACT inhaler Inhale 1 puff into the lungs as needed for wheezing or shortness of breath.     levalbuterol (XOPENEX) 0.63 MG/3ML nebulizer solution Take 0.63 mg by nebulization every 6 (six) hours as needed for wheezing or shortness of breath.     montelukast (SINGULAIR) 10 MG tablet Take 1 tablet (10 mg total) by mouth at bedtime. 30 tablet 3   ondansetron (ZOFRAN-ODT) 4 MG disintegrating tablet Take 1 tablet (4 mg total) by mouth every 8 (eight) hours as needed for nausea or vomiting. 20 tablet 0   ondansetron (ZOFRAN-ODT) 4 MG disintegrating tablet Take 4 mg by mouth every 8 (eight) hours as needed for nausea or vomiting.     traZODone (DESYREL) 100 MG tablet Take 100-300 mg by mouth at bedtime as needed for sleep.     acetaminophen (TYLENOL) 500 MG tablet Take 2 tablets (1,000 mg total) by mouth every 8 (eight) hours. (Patient not taking: Reported on 01/02/2023) 30 tablet 0   No facility-administered medications prior to visit.    Family History  Problem Relation Age of Onset   Hypertension Mother     Social History   Socioeconomic History   Marital status: Married    Spouse name: Not on file   Number of children: 2   Years of education: Not on file   Highest education level: Not on file  Occupational History   Not on file  Tobacco Use   Smoking status: Never    Passive exposure: Never   Smokeless tobacco: Never  Vaping Use   Vaping Use: Never used  Substance and Sexual Activity   Alcohol use: Yes    Alcohol/week: 5.0 standard drinks of alcohol    Types: 5 Glasses of wine per week    Comment: OCC   Drug use: No   Sexual activity: Yes    Partners: Male    Birth control/protection: Surgical  Other Topics Concern   Not on file  Social History Narrative   Regular  exercise: can't at this time   Caffeine use: no   Social Determinants of Corporate investment banker Strain: Not on file  Food Insecurity: No Food Insecurity (01/02/2023)   Hunger Vital Sign    Worried About Running Out of Food in the Last Year: Never true    Ran Out of Food in the Last Year: Never true  Transportation Needs: No Transportation Needs (01/02/2023)   PRAPARE - Administrator, Civil Service (Medical): No    Lack of Transportation (Non-Medical): No  Physical Activity: Not on file  Stress: Not on file  Social Connections: Not on file  Intimate Partner Violence: Not At Risk (01/02/2023)   Humiliation, Afraid, Rape, and Kick questionnaire    Fear of Current or Ex-Partner: No    Emotionally Abused: No    Physically Abused: No    Sexually Abused: No  Objective:  Physical Exam: BP 122/82 (BP Location: Left Arm, Patient Position: Sitting, Cuff Size: Large)   Pulse 85   Temp (!) 97.5 F (36.4 C) (Temporal)   Wt 194 lb 12.8 oz (88.4 kg)   SpO2 99%   BMI 28.77 kg/m     Physical Exam Constitutional:      General: She is not in acute distress.    Appearance: Normal appearance. She is not ill-appearing or toxic-appearing.  HENT:     Head: Normocephalic and atraumatic.     Nose: Nose normal. No congestion.  Eyes:     General: No scleral icterus.    Extraocular Movements: Extraocular movements intact.  Cardiovascular:     Rate and Rhythm: Normal rate and regular rhythm.     Pulses: Normal pulses.     Heart sounds: Normal heart sounds.  Pulmonary:     Effort: Pulmonary effort is normal. No respiratory distress.     Breath sounds: Normal breath sounds.  Abdominal:     General: Abdomen is flat. Bowel sounds are normal.     Palpations: Abdomen is soft.  Musculoskeletal:        General: Normal range of motion.  Lymphadenopathy:     Cervical: No cervical  adenopathy.  Skin:    General: Skin is warm and dry.     Findings: No rash.  Neurological:     General: No focal deficit present.     Mental Status: She is alert and oriented to person, place, and time. Mental status is at baseline.  Psychiatric:        Mood and Affect: Mood normal.        Behavior: Behavior normal.        Thought Content: Thought content normal.        Judgment: Judgment normal.    Assessment: The risk assessment using the [employer] of Surgeons calculator indicates an average or below risk for serious complications. (Assessment scanned to patient chart. )    Latest Reference Range & Units 05/04/09 09:39 07/09/11 14:48 07/09/11 14:52 07/09/11 15:30 08/10/11 12:34 03/27/13 15:47 09/07/13 09:35 09/30/13 09:45 11/23/13 09:31 11/23/13 12:00 06/26/22 17:08 07/06/22 15:39  Factor II Activity 74 - 131 %  120            Factor VII Activity 60 - 150 %     143         Factor-VIII Activity 50 - 180 %  124            Factor XI Activity 65 - 150 %     99         Factor XII Activity      REPORT         Von Willebrand Factor Ag 50 - 217 %  86            Von Willebrand Multimers   REPORT            Ristocetin Co-Factor 42 - 200 %  91            D-Dimer, Quant <0.50 mcg/mL FEU            0.88 (H)  Bleeding Time 2.0 - 8.0 Minutes    15.0 (HH) 12.0 (H) 5.5        Spontaneous Plt Aggr      Not demonstrated in 20 min         Epinephrine 10      Normal  ADP5      Normal         ADP10      Normal         Collagen Aggregation      Normal         Arachidonic Acid      Normal         Thrombin Receptor      Normal at 4.0 UM         Ristocetin      Normal at 1.0         Protime 10.6 - 13.4 Seconds 12.0             Prothrombin Time 11.4 - 15.2 seconds   12.6       12.6 12.4   INR 0.8 - 1.2  1.0 (L)  0.92       0.96 0.9   APTT 24 - 37 seconds 29  30       25     C206 ACTH 10 - 46 pg/mL        <5 (L)      Cortisol, Plasma ug/dL       0.3 <1.6 10.9     Prolactin ng/mL         34.8      (HH): Data is critically high (H): Data is abnormally high (L): Data is abnormally low  CT ABDOMEN PELVIS W CONTRAST  Result Date: 01/02/2023 CLINICAL DATA:  Abdominal pain, acute, nonlocalized. Abdominal pain with nausea and vomiting since Monday. History of gastro paresis. EXAM: CT ABDOMEN AND PELVIS WITH CONTRAST TECHNIQUE: Multidetector CT imaging of the abdomen and pelvis was performed using the standard protocol following bolus administration of intravenous contrast. RADIATION DOSE REDUCTION: This exam was performed according to the departmental dose-optimization program which includes automated exposure control, adjustment of the mA and/or kV according to patient size and/or use of iterative reconstruction technique. CONTRAST:  OMNIPAQUE IOHEXOL 300 MG/ML  SOLN COMPARISON:  Abdominopelvic CT 11/11/2021. FINDINGS: Lower chest: Clear lung bases. No significant pleural or pericardial effusion. Hepatobiliary: The liver is normal in density without suspicious focal abnormality. No significant biliary dilatation or residual pneumobilia status post cholecystectomy. Pancreas: Unremarkable. No pancreatic ductal dilatation or surrounding inflammatory changes. Spleen: Normal in size without focal abnormality. Adrenals/Urinary Tract: Both adrenal glands appear normal. No evidence of urinary tract calculus, suspicious renal lesion or hydronephrosis. The bladder appears unremarkable for its degree of distention. Stomach/Bowel: No enteric contrast administered. There are stable postsurgical changes from previous gastric sleeve resection and appendectomy. No evidence of bowel wall thickening, distention or surrounding inflammation. Vascular/Lymphatic: There are no enlarged abdominal or pelvic lymph nodes. No significant vascular findings. Reproductive: Hysterectomy.  No adnexal mass. Other: Postsurgical changes in the low anterior abdominal wall. No evidence of hernia, ascites or free air.  Musculoskeletal: No acute or significant osseous findings. Status post L5-S1 fusion. IMPRESSION: 1. No acute findings or explanation for the patient's symptoms. 2. Stable postsurgical changes as described. Electronically Signed   By: Carey Bullocks M.D.   On: 01/02/2023 09:37   DG Lumbar Spine 2-3 Views  Result Date: 12/13/2022 CLINICAL DATA:  Status post fall.  Prior fusion at L5-S1. EXAM: LUMBAR SPINE - 2-3 VIEW COMPARISON:  MRI of lumbar spine May 14 2022 FINDINGS: Prior posterior fusion of L5-S1 without malalignment. Mild decreased intervertebral space identified at L4-5. There is no evidence of lumbar spine fracture. Alignment is normal. IMPRESSION: Prior posterior fusion of L5-S1 without malalignment. Mild  degenerative joint changes at L4-5. Electronically Signed   By: Sherian Rein M.D.   On: 12/13/2022 08:25    Recent Results (from the past 2160 hour(s))  Urinalysis, Routine w reflex microscopic -Urine, Clean Catch     Status: Abnormal   Collection Time: 01/02/23  7:54 AM  Result Value Ref Range   Color, Urine YELLOW YELLOW   APPearance CLEAR CLEAR   Specific Gravity, Urine >=1.030 1.005 - 1.030   pH 5.5 5.0 - 8.0   Glucose, UA NEGATIVE NEGATIVE mg/dL   Hgb urine dipstick NEGATIVE NEGATIVE   Bilirubin Urine SMALL (A) NEGATIVE   Ketones, ur NEGATIVE NEGATIVE mg/dL   Protein, ur 30 (A) NEGATIVE mg/dL   Nitrite NEGATIVE NEGATIVE   Leukocytes,Ua NEGATIVE NEGATIVE    Comment: Performed at [facility] [address] [place], [place] [address]  Pregnancy, urine     Status: None   Collection Time: 01/02/23  7:54 AM  Result Value Ref Range   Preg Test, Ur NEGATIVE NEGATIVE    Comment:        THE SENSITIVITY OF THIS METHODOLOGY IS >20 mIU/mL. Performed at [facility], [address] [place], [place] [address]   Urinalysis, Microscopic (reflex)     Status: Abnormal   Collection Time: 01/02/23  7:54 AM  Result Value Ref Range   RBC / HPF 0-5 0 - 5 RBC/hpf    WBC, UA 0-5 0 - 5 WBC/hpf   Bacteria, UA FEW (A) NONE SEEN   Squamous Epithelial / HPF 6-10 0 - 5 /HPF   Mucus PRESENT     Comment: Performed at [facility], [address] [place], [place] [address]  CBC with Differential     Status: None   Collection Time: 01/02/23  8:00 AM  Result Value Ref Range   WBC 5.9 4.0 - 10.5 K/uL   RBC 4.13 3.87 - 5.11 MIL/uL   Hemoglobin 13.0 12.0 - 15.0 g/dL   HCT 96.2 95.2 - 84.1 %   MCV 94.7 80.0 - 100.0 fL   MCH 31.5 26.0 - 34.0 pg   MCHC 33.2 30.0 - 36.0 g/dL   RDW 32.4 40.1 - 02.7 %   Platelets 265 150 - 400 K/uL   nRBC 0.0 0.0 - 0.2 %   Neutrophils Relative % 65 %   Neutro Abs 3.9 1.7 - 7.7 K/uL   Lymphocytes Relative 25 %   Lymphs Abs 1.5 0.7 - 4.0 K/uL   Monocytes Relative 8 %   Monocytes Absolute 0.5 0.1 - 1.0 K/uL   Eosinophils Relative 1 %   Eosinophils Absolute 0.1 0.0 - 0.5 K/uL   Basophils Relative 1 %   Basophils Absolute 0.0 0.0 - 0.1 K/uL   Immature Granulocytes 0 %   Abs Immature Granulocytes 0.01 0.00 - 0.07 K/uL    Comment: Performed at [facility], [address] [place], [place] [address]  Comprehensive metabolic panel     Status: Abnormal   Collection Time: 01/02/23  8:00 AM  Result Value Ref Range   Sodium 136 135 - 145 mmol/L   Potassium 3.7 3.5 - 5.1 mmol/L   Chloride 103 98 - 111 mmol/L   CO2 25 22 - 32 mmol/L   Glucose, Bld 99 70 - 99 mg/dL    Comment: Glucose reference range applies only to samples taken after fasting for at least 8 hours.   BUN 11 6 - 20 mg/dL  Creatinine, Ser 0.95 0.44 - 1.00 mg/dL   Calcium 8.7 (L) 8.9 - 10.3 mg/dL   Total Protein 8.0 6.5 - 8.1 g/dL   Albumin 4.3 3.5 - 5.0 g/dL   AST 25 15 - 41 U/L   ALT 20 0 - 44 U/L   Alkaline Phosphatase 58 38 - 126 U/L   Total Bilirubin 0.7 0.3 - 1.2 mg/dL   GFR, Estimated >16 >10 mL/min    Comment: (NOTE) Calculated using the CKD-EPI Creatinine Equation (2021)    Anion gap 8 5 - 15    Comment: Performed at [facility], [address] [place], [place] [address]  Lipase, blood     Status: None   Collection Time: 01/02/23  8:00 AM  Result Value Ref Range   Lipase 25 11 - 51 U/L    Comment: Performed at [facility], [address] [place], [place] [address]  HIV Antibody (routine testing w rflx)     Status: None   Collection Time: 01/03/23  5:29 AM  Result Value Ref Range   HIV Screen 4th Generation wRfx Non Reactive Non Reactive    Comment: Performed at [facility], [address], [place], [place] [address]  CBC     Status: Abnormal   Collection Time: 01/03/23  5:29 AM  Result Value Ref Range   WBC 5.9 4.0 - 10.5 K/uL   RBC 3.83 (L) 3.87 - 5.11 MIL/uL   Hemoglobin 12.1 12.0 - 15.0 g/dL   HCT 82.9 56.2 - 13.0 %   MCV 99.5 80.0 - 100.0 fL   MCH 31.6 26.0 - 34.0 pg   MCHC 31.8 30.0 - 36.0 g/dL   RDW 86.5 78.4 - 69.6 %   Platelets 231 150 - 400 K/uL   nRBC 0.0 0.0 - 0.2 %    Comment: Performed at [facility], [address] [place], [place] [address]  Comprehensive metabolic panel     Status: Abnormal   Collection Time: 01/03/23  5:29 AM  Result Value Ref Range   Sodium 137 135 - 145 mmol/L   Potassium 3.3 (L) 3.5 - 5.1 mmol/L   Chloride 104 98 - 111 mmol/L   CO2 26 22 - 32 mmol/L   Glucose, Bld 83 70 - 99 mg/dL    Comment: Glucose reference range applies only to samples taken after fasting for at least 8 hours.   BUN 7 6 - 20 mg/dL   Creatinine, Ser 4.13 0.44 - 1.00 mg/dL   Calcium 8.2 (L) 8.9 - 10.3 mg/dL   Total Protein 7.0 6.5 - 8.1 g/dL   Albumin 4.1 3.5 - 5.0 g/dL   AST 19 15 - 41 U/L   ALT 17 0 - 44 U/L   Alkaline Phosphatase 47 38 - 126 U/L   Total Bilirubin 0.5 0.3 - 1.2 mg/dL   GFR, Estimated >24 >40 mL/min    Comment: (NOTE) Calculated using the CKD-EPI Creatinine Equation (2021)    Anion gap 7 5 - 15    Comment: Performed at [facility], [address], [place], [place] [address]  Basic metabolic panel      Status: Abnormal   Collection Time: 01/04/23  5:55 AM  Result Value Ref Range   Sodium 136 135 - 145 mmol/L   Potassium 3.8 3.5 - 5.1 mmol/L   Chloride 101 98 - 111 mmol/L   CO2 26 22 - 32 mmol/L   Glucose,  Bld 99 70 - 99 mg/dL    Comment: Glucose reference range applies only to samples taken after fasting for at least 8 hours.   BUN <5 (L) 6 - 20 mg/dL   Creatinine, Ser 8.29 0.44 - 1.00 mg/dL   Calcium 8.7 (L) 8.9 - 10.3 mg/dL   GFR, Estimated >56 >21 mL/min    Comment: (NOTE) Calculated using the CKD-EPI Creatinine Equation (2021)    Anion gap 9 5 - 15    Comment: Performed at [facility], [address], [place], [place] [address]        Garner Nash, MD, MS

## 2023-02-27 NOTE — Assessment & Plan Note (Signed)
The patient, with a previous negative hypocoagulability workup in 2014. Laboratory findings were predominantly normal, including clotting factors including von Willebrand's and platelet function tests. However, the patient did have an elevated D-dimer level of 0.88 mcg/mL FEU and a prolonged bleeding time of 15.0 and 12.0 minutes.    Plan to reassess platelet count and PT/INR and a PTT.  If any abnormalities observed, can consider referral to hematology for further evaluation.

## 2023-02-28 NOTE — Assessment & Plan Note (Signed)
Improved symptoms with tolerance to fluids, no recent vomiting.  Plan: Continue current management with upcoming gastroenterology evaluation.

## 2023-02-28 NOTE — Assessment & Plan Note (Signed)
Stable without medications and without any ongoing symptoms of endocrine dysfunction Address preoperative steroid use with anesthesia if indicated due to historical Cushing's Syndrome.

## 2023-03-05 ENCOUNTER — Encounter: Payer: Self-pay | Admitting: Family Medicine

## 2023-03-05 ENCOUNTER — Other Ambulatory Visit (INDEPENDENT_AMBULATORY_CARE_PROVIDER_SITE_OTHER): Payer: BC Managed Care – PPO

## 2023-03-05 DIAGNOSIS — Z01818 Encounter for other preprocedural examination: Secondary | ICD-10-CM

## 2023-03-05 LAB — CBC WITH DIFFERENTIAL/PLATELET
Basophils Absolute: 0 10*3/uL (ref 0.0–0.1)
Basophils Relative: 0.6 % (ref 0.0–3.0)
Eosinophils Absolute: 0.1 10*3/uL (ref 0.0–0.7)
Eosinophils Relative: 1.3 % (ref 0.0–5.0)
HCT: 36.4 % (ref 36.0–46.0)
Hemoglobin: 12.2 g/dL (ref 12.0–15.0)
Lymphocytes Relative: 29.1 % (ref 12.0–46.0)
Lymphs Abs: 1.4 10*3/uL (ref 0.7–4.0)
MCHC: 33.5 g/dL (ref 30.0–36.0)
MCV: 95.9 fl (ref 78.0–100.0)
Monocytes Absolute: 0.3 10*3/uL (ref 0.1–1.0)
Monocytes Relative: 6.7 % (ref 3.0–12.0)
Neutro Abs: 3.1 10*3/uL (ref 1.4–7.7)
Neutrophils Relative %: 62.3 % (ref 43.0–77.0)
Platelets: 232 10*3/uL (ref 150.0–400.0)
RBC: 3.8 Mil/uL — ABNORMAL LOW (ref 3.87–5.11)
RDW: 13.8 % (ref 11.5–15.5)
WBC: 5 10*3/uL (ref 4.0–10.5)

## 2023-03-05 LAB — COMPREHENSIVE METABOLIC PANEL
ALT: 12 U/L (ref 0–35)
AST: 13 U/L (ref 0–37)
Albumin: 4.1 g/dL (ref 3.5–5.2)
Alkaline Phosphatase: 46 U/L (ref 39–117)
BUN: 8 mg/dL (ref 6–23)
CO2: 25 mEq/L (ref 19–32)
Calcium: 8.8 mg/dL (ref 8.4–10.5)
Chloride: 106 mEq/L (ref 96–112)
Creatinine, Ser: 0.83 mg/dL (ref 0.40–1.20)
GFR: 88.84 mL/min (ref >60.00)
Glucose, Bld: 96 mg/dL (ref 70–99)
Potassium: 4.3 mEq/L (ref 3.5–5.1)
Sodium: 141 mEq/L (ref 135–145)
Total Bilirubin: 0.6 mg/dL (ref 0.2–1.2)
Total Protein: 6.7 g/dL (ref 6.0–8.3)

## 2023-03-05 LAB — PROTIME-INR
INR: 0.9 ratio (ref 0.8–1.0)
Prothrombin Time: 10 s (ref 9.6–13.1)

## 2023-03-05 LAB — APTT: aPTT: 29 s (ref 25.4–36.8)

## 2023-03-05 LAB — HEMOGLOBIN A1C: Hgb A1c MFr Bld: 5.3 % (ref 4.6–6.5)

## 2023-03-21 DIAGNOSIS — F331 Major depressive disorder, recurrent, moderate: Secondary | ICD-10-CM | POA: Diagnosis not present

## 2023-03-21 DIAGNOSIS — F605 Obsessive-compulsive personality disorder: Secondary | ICD-10-CM | POA: Diagnosis not present

## 2023-03-21 DIAGNOSIS — F411 Generalized anxiety disorder: Secondary | ICD-10-CM | POA: Diagnosis not present

## 2023-04-04 ENCOUNTER — Encounter: Payer: Self-pay | Admitting: Cardiology

## 2023-04-08 ENCOUNTER — Other Ambulatory Visit: Payer: Self-pay

## 2023-04-08 MED ORDER — IVABRADINE HCL 7.5 MG PO TABS: 7.5000 mg | ORAL_TABLET | Freq: Two times a day (BID) | ORAL | 3 refills | Status: DC

## 2023-04-19 ENCOUNTER — Ambulatory Visit: Payer: BC Managed Care – PPO | Admitting: Family Medicine

## 2023-04-19 ENCOUNTER — Encounter: Payer: Self-pay | Admitting: Family Medicine

## 2023-04-19 VITALS — BP 122/78 | HR 70 | Temp 98.9°F | Wt 200.0 lb

## 2023-04-19 DIAGNOSIS — J0100 Acute maxillary sinusitis, unspecified: Secondary | ICD-10-CM | POA: Insufficient documentation

## 2023-04-19 MED ORDER — AMOXICILLIN-POT CLAVULANATE 875-125 MG PO TABS: 1.0000 | ORAL_TABLET | Freq: Two times a day (BID) | ORAL | 0 refills | Status: AC

## 2023-04-19 MED ORDER — IPRATROPIUM BROMIDE 0.03 % NA SOLN: 2.0000 | Freq: Two times a day (BID) | NASAL | 12 refills | Status: AC

## 2023-04-19 NOTE — Progress Notes (Signed)
Assessment/Plan:   Problem List Items Addressed This Visit       Respiratory   Acute non-recurrent maxillary sinusitis - Primary    Prescribe Augmentin 875 mg/125 mg, BID for 10 days Continue Flonase, 2 sprays per nostril daily Add Ipratropium 0.03% nasal spray, 2 sprays per nostril as needed for congestion Advil as needed for pain and fever Follow up as needed      Relevant Medications   amoxicillin-clavulanate (AUGMENTIN) 875-125 MG tablet   ipratropium (ATROVENT) 0.03 % nasal spray   fluticasone (FLONASE) 50 MCG/ACT nasal spray   Pseudoephedrine-Ibuprofen (ADVIL COLD/SINUS PO)    There are no discontinued medications.  Return if symptoms worsen or fail to improve, for Sinus right maxillary.    Subjective:   Encounter date: 04/19/2023  Kaitlyn Johnson is a 42 y.o. 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   She  has a past medical history of Acute appendicitis (02/28/2017), Acute pancreatitis (11/30/2013), Anemia (11/26/2011), Anxiety, Asthma, Celiac and mesenteric artery injury, Cushing's syndrome (HCC), Depression, H/O hiatal hernia, History of kidney stones, Iatrogenic  Cushing's syndrome (HCC) (10/02/2013), Nausea vomiting and diarrhea (11/23/2013), Nausea with vomiting (11/30/2013), Palpitations (12/05/2018), Pancreatitis (11/29/2013), POTS (postural orthostatic tachycardia syndrome), Shortness of breath dyspnea, and Sphincter of Oddi dysfunction..   Chief Complaint: Congestion, runny nose, and frontal headaches lasting for a week and a half.  History of Present Illness:  Sinus Infection. Patient reports congestion, runny nose, and frontal headaches for the last week and a half. The symptoms are predominantly on the right side, with associated pain radiating to the teeth on the right side and slight discomfort in the right ear. The patient has been using Advil Congestion Relief and Flonase  without improvement. There is no history of fever, chills, chest pain, shortness of breath, or wheezing. The patient denies recent improvement or exacerbation of symptoms. The patient has a past history of sinus infections, with the last episode occurring last year or earlier this year. No antibiotic allergies except for sulfa.  Review of Systems  Constitutional:  Negative for chills and fever.  HENT:  Positive for congestion, ear pain (Right) and sinus pain.   Respiratory:  Negative for cough, shortness of breath and wheezing.   Cardiovascular:  Negative for chest pain.  Neurological:  Positive for headaches.  All other systems reviewed and are negative.   Past Surgical History:  Procedure Laterality Date   ABDOMINAL HYSTERECTOMY  08/2010   ANTERIOR CERVICAL DECOMP/DISCECTOMY FUSION N/A 03/31/2013   Procedure: ANTERIOR CERVICAL DECOMPRESSION/DISCECTOMY FUSION 1 LEVEL Cervical five-six;  Surgeon: Reinaldo Meeker, MD;  Location: [facility];  Service: Neurosurgery;  Laterality: N/A;   APPENDECTOMY     BACK SURGERY     Cholangio-Pancreatography with Spintectorotomy + Stent  07/16/2012   01/15/14   CHOLECYSTECTOMY     ESOPHAGOGASTRODUODENOSCOPY (EGD) WITH PROPOFOL N/A  08/19/2018   Procedure: ESOPHAGOGASTRODUODENOSCOPY (EGD) WITH PROPOFOL;  Surgeon: Bosie Clos,  Oswaldo Done, MD;  Location: [facility];  Service: Endoscopy;  Laterality: N/A;   LAPAROSCOPIC APPENDECTOMY N/A 02/28/2017   Procedure: APPENDECTOMY LAPAROSCOPIC;  Surgeon: Harriette Bouillon, MD;  Location: [facility];  Service: General;  Laterality: N/A;   LAPAROSCOPIC ENDOMETRIOSIS FULGURATION     LAPAROSCOPIC GASTRIC SLEEVE RESECTION N/A 07/15/2018   Procedure: LAPAROSCOPIC GASTRIC SLEEVE RESECTION, UPPER ENDO, ERAS Pathway;  Surgeon: Luretha Maffia, MD;  Location: [facility];  Service: General;  Laterality: N/A;   LUMBAR LAMINECTOMY     LUMBAR LAMINECTOMY/DECOMPRESSION MICRODISCECTOMY Left 06/22/2016   Procedure: MICRODISCECTOMY LEFT LUMBAR FOUR-FIVE;  Surgeon: Lisbeth Renshaw, MD;  Location: [facility];  Service: Neurosurgery;  Laterality: Left;   ROUX-EN-Y PROCEDURE  04/1999    Outpatient Medications Prior to Visit  Medication Sig Dispense Refill   ABILIFY MAINTENA 400 MG SRER injection Inject 400 mg into the muscle every 28 (twenty-eight) days.     acebutolol (SECTRAL) 200 MG capsule Take 1 capsule (200 mg total) by mouth 2 (two) times daily. 180 capsule 3   acetaminophen (TYLENOL) 500 MG tablet Take 2 tablets (1,000 mg total) by mouth every 8 (eight) hours. 30 tablet 0   albuterol (VENTOLIN HFA) 108 (90 Base) MCG/ACT inhaler Inhale 1 puff into the lungs as needed for wheezing or shortness of breath.     AMBIEN CR 12.5 MG CR tablet Take 12.5 mg by mouth at bedtime.  1   busPIRone (BUSPAR) 10 MG tablet Take 20 mg by mouth 3 (three) times daily.     clonazePAM (KLONOPIN) 0.5 MG tablet Take 0.5 mg by mouth 2 (two) times daily as needed for anxiety.     cyanocobalamin (VITAMIN B12) 1000 MCG/ML injection Inject 1,000 mcg into the skin every 30 (thirty) days.     desipramine (NORPRAMIN) 25 MG tablet Take 25 mg by mouth at bedtime.     DEXILANT 30 MG capsule Take 30 mg by mouth 2 (two) times daily.      diltiazem (CARDIZEM CD) 180 MG 24 hr capsule TAKE ONE CAPSULE BY MOUTH EVERY DAY 90 capsule 3   EPINEPHrine 0.3 mg/0.3 mL IJ SOAJ injection Inject 0.3 mg into the muscle as needed for anaphylaxis.     fluticasone (FLONASE) 50 MCG/ACT nasal spray Place 2 sprays into both nostrils daily.     ivabradine (CORLANOR) 7.5 MG TABS tablet Take 1 tablet (7.5 mg total) by mouth 2 (two) times daily with a meal. 180 tablet 3   lamoTRIgine (LAMICTAL) 100 MG tablet Take 100 mg by mouth at bedtime.     levalbuterol (XOPENEX HFA) 45 MCG/ACT inhaler Inhale 1 puff into the lungs as needed for wheezing or shortness of breath.     levalbuterol (XOPENEX) 0.63 MG/3ML nebulizer solution Take 0.63 mg by nebulization every 6 (six) hours as needed for wheezing or shortness of breath.     montelukast (SINGULAIR) 10 MG tablet Take 1 tablet (10 mg total) by mouth at bedtime. 30 tablet 3   ondansetron (ZOFRAN-ODT) 4 MG disintegrating tablet Take 1 tablet (4 mg total) by mouth every 8 (eight) hours as needed for nausea or vomiting. 20 tablet 0   ondansetron (ZOFRAN-ODT) 4 MG disintegrating tablet Take 4 mg by mouth every 8 (eight) hours as needed for nausea or vomiting.     Pseudoephedrine-Ibuprofen (ADVIL COLD/SINUS PO) Take by mouth.     traZODone (DESYREL) 100 MG tablet Take 100-300 mg by mouth at bedtime as needed for sleep.     No facility-administered medications prior to visit.  Family History  Problem Relation Age of Onset   Hypertension Mother     Social History   Socioeconomic History   Marital status: Married    Spouse name: Not on file   Number of children: 2   Years of education: Not on file   Highest education level: Not on file  Occupational History   Not on file  Tobacco Use   Smoking status: Never    Passive exposure: Never   Smokeless tobacco: Never  Vaping Use   Vaping Use: Never used  Substance and Sexual Activity   Alcohol use: Yes    Alcohol/week: 5.0 standard drinks of alcohol     Types: 5 Glasses of wine per week    Comment: OCC   Drug use: No   Sexual activity: Yes    Partners: Male    Birth control/protection: Surgical  Other Topics Concern   Not on file  Social History Narrative   Regular exercise: can't at this time   Caffeine use: no   Social Determinants of Corporate investment banker Strain: Not on file  Food Insecurity: No Food Insecurity (01/02/2023)   Hunger Vital Sign    Worried About Running Out of Food in the Last Year: Never true    Ran Out of Food in the Last Year: Never true  Transportation Needs: No Transportation Needs (01/02/2023)   PRAPARE - Administrator, Civil Service (Medical): No    Lack of Transportation (Non-Medical): No  Physical Activity: Not on file  Stress: Not on file  Social Connections: Not on file  Intimate Partner Violence: Not At Risk (01/02/2023)   Humiliation, Afraid, Rape, and Kick questionnaire    Fear of Current or Ex-Partner: No    Emotionally Abused: No    Physically Abused: No    Sexually Abused: No                                                                                                  Objective:  Physical Exam: BP 122/78 (BP Location: Left Arm, Patient Position: Sitting, Cuff Size: Large)   Pulse 70   Temp 98.9 F (37.2 C) (Oral)   Wt 200 lb (90.7 kg)   SpO2 97%   BMI 29.53 kg/m     Physical Exam Constitutional:      General: She is not in acute distress.    Appearance: Normal appearance. She is not ill-appearing or toxic-appearing.  HENT:     Head: Normocephalic and atraumatic.     Right Ear: Tympanic membrane and ear canal normal.     Left Ear: Tympanic membrane and ear canal normal.     Nose: Congestion present.     Right Sinus: Maxillary sinus tenderness present.  Eyes:     General: No scleral icterus.    Extraocular Movements: Extraocular movements intact.  Cardiovascular:     Rate and Rhythm: Normal rate and regular rhythm.     Pulses: Normal pulses.     Heart  sounds: Normal heart sounds.  Pulmonary:     Effort: Pulmonary effort is normal.  No respiratory distress.     Breath sounds: Normal breath sounds.  Abdominal:     General: Abdomen is flat. Bowel sounds are normal.     Palpations: Abdomen is soft.  Musculoskeletal:        General: Normal range of motion.  Lymphadenopathy:     Cervical: No cervical adenopathy.  Skin:    General: Skin is warm and dry.     Findings: No rash.  Neurological:     General: No focal deficit present.     Mental Status: She is alert and oriented to person, place, and time. Mental status is at baseline.  Psychiatric:        Mood and Affect: Mood normal.        Behavior: Behavior normal.        Thought Content: Thought content normal.        Judgment: Judgment normal.     No results found.  Recent Results (from the past 2160 hour(s))  Protime-INR     Status: None   Collection Time: 03/05/23  8:18 AM  Result Value Ref Range   INR 0.9 0.8 - 1.0 ratio   Prothrombin Time 10.0 9.6 - 13.1 sec  Hemoglobin A1C     Status: None   Collection Time: 03/05/23  8:18 AM  Result Value Ref Range   Hgb A1c MFr Bld 5.3 4.6 - 6.5 %    Comment: Glycemic Control Guidelines for People with Diabetes:Non Diabetic:  <6%Goal of Therapy: <7%Additional Action Suggested:  >8%   Comp Met (CMET)     Status: None   Collection Time: 03/05/23  8:18 AM  Result Value Ref Range   Sodium 141 135 - 145 mEq/L   Potassium 4.3 3.5 - 5.1 mEq/L   Chloride 106 96 - 112 mEq/L   CO2 25 19 - 32 mEq/L   Glucose, Bld 96 70 - 99 mg/dL   BUN 8 6 - 23 mg/dL   Creatinine, Ser 1.61 0.40 - 1.20 mg/dL   Total Bilirubin 0.6 0.2 - 1.2 mg/dL   Alkaline Phosphatase 46 39 - 117 U/L   AST 13 0 - 37 U/L   ALT 12 0 - 35 U/L   Total Protein 6.7 6.0 - 8.3 g/dL   Albumin 4.1 3.5 - 5.2 g/dL   GFR 09.60 >45.40 mL/min    Comment: Calculated using the CKD-EPI Creatinine Equation (2021)   Calcium 8.8 8.4 - 10.5 mg/dL  CBC w/Diff     Status: Abnormal    Collection Time: 03/05/23  8:18 AM  Result Value Ref Range   WBC 5.0 4.0 - 10.5 K/uL   RBC 3.80 (L) 3.87 - 5.11 Mil/uL   Hemoglobin 12.2 12.0 - 15.0 g/dL   HCT 98.1 19.1 - 47.8 %   MCV 95.9 78.0 - 100.0 fl   MCHC 33.5 30.0 - 36.0 g/dL   RDW 29.5 62.1 - 30.8 %   Platelets 232.0 150.0 - 400.0 K/uL   Neutrophils Relative % 62.3 43.0 - 77.0 %   Lymphocytes Relative 29.1 12.0 - 46.0 %   Monocytes Relative 6.7 3.0 - 12.0 %   Eosinophils Relative 1.3 0.0 - 5.0 %   Basophils Relative 0.6 0.0 - 3.0 %   Neutro Abs 3.1 1.4 - 7.7 K/uL   Lymphs Abs 1.4 0.7 - 4.0 K/uL   Monocytes Absolute 0.3 0.1 - 1.0 K/uL   Eosinophils Absolute 0.1 0.0 - 0.7 K/uL   Basophils Absolute 0.0 0.0 - 0.1 K/uL  APTT  Status: None   Collection Time: 03/05/23  8:18 AM  Result Value Ref Range   aPTT 29.0 25.4 - 36.8 SEC        Garner Nash, MD, MS

## 2023-04-19 NOTE — Assessment & Plan Note (Signed)
Prescribe Augmentin 875 mg/125 mg, BID for 10 days Continue Flonase, 2 sprays per nostril daily Add Ipratropium 0.03% nasal spray, 2 sprays per nostril as needed for congestion Advil as needed for pain and fever Follow up as needed

## 2023-05-08 ENCOUNTER — Encounter: Payer: Self-pay | Admitting: Cardiology

## 2023-06-10 ENCOUNTER — Other Ambulatory Visit: Payer: Self-pay | Admitting: Family Medicine

## 2023-06-10 MED ORDER — EPINEPHRINE 0.3 MG/0.3ML IJ SOAJ: 0.3000 mg | INTRAMUSCULAR | 3 refills | Status: AC | PRN

## 2023-06-12 ENCOUNTER — Telehealth: Payer: Self-pay | Admitting: Family Medicine

## 2023-06-12 NOTE — Telephone Encounter (Signed)
Pt needs her montelukast (SINGULAIR) 10 MG tablet [[id number]] refilled at  [facility] [place], [place] - [address] [place] [address] Phone: [phone]  Fax: [phone]

## 2023-06-13 MED ORDER — MONTELUKAST SODIUM 10 MG PO TABS: 10.0000 mg | ORAL_TABLET | Freq: Every day | ORAL | 3 refills | Status: DC

## 2023-06-13 NOTE — Telephone Encounter (Signed)
 Chart supports rx. Last OV: 04/19/2023

## 2023-06-20 DIAGNOSIS — F331 Major depressive disorder, recurrent, moderate: Secondary | ICD-10-CM | POA: Diagnosis not present

## 2023-06-20 DIAGNOSIS — F605 Obsessive-compulsive personality disorder: Secondary | ICD-10-CM | POA: Diagnosis not present

## 2023-06-20 DIAGNOSIS — F411 Generalized anxiety disorder: Secondary | ICD-10-CM | POA: Diagnosis not present

## 2023-07-15 ENCOUNTER — Encounter: Payer: Self-pay | Admitting: Family Medicine

## 2023-07-15 NOTE — Telephone Encounter (Signed)
Given the urgency of patient's presentation, I recommend she go to urgent care or ED if symptoms are as severe as she states due to risk of potential dehydration.

## 2023-07-16 ENCOUNTER — Other Ambulatory Visit (HOSPITAL_BASED_OUTPATIENT_CLINIC_OR_DEPARTMENT_OTHER): Payer: Self-pay

## 2023-07-16 ENCOUNTER — Other Ambulatory Visit: Payer: Self-pay

## 2023-07-16 ENCOUNTER — Emergency Department (HOSPITAL_BASED_OUTPATIENT_CLINIC_OR_DEPARTMENT_OTHER): Payer: BC Managed Care – PPO

## 2023-07-16 ENCOUNTER — Inpatient Hospital Stay (HOSPITAL_BASED_OUTPATIENT_CLINIC_OR_DEPARTMENT_OTHER)
Admission: EM | Admit: 2023-07-16 | Discharge: 2023-07-24 | DRG: 392 | Disposition: A | Discharge status: Other Acute Inpatient Hospital | Payer: BC Managed Care – PPO | Attending: Internal Medicine | Admitting: Internal Medicine

## 2023-07-16 ENCOUNTER — Encounter (HOSPITAL_BASED_OUTPATIENT_CLINIC_OR_DEPARTMENT_OTHER): Payer: Self-pay | Admitting: Emergency Medicine

## 2023-07-16 DIAGNOSIS — Z91030 Bee allergy status: Secondary | ICD-10-CM | POA: Diagnosis not present

## 2023-07-16 DIAGNOSIS — G8929 Other chronic pain: Secondary | ICD-10-CM | POA: Diagnosis present

## 2023-07-16 DIAGNOSIS — Z887 Allergy status to serum and vaccine status: Secondary | ICD-10-CM

## 2023-07-16 DIAGNOSIS — D6804 Acquired von Willebrand disease: Secondary | ICD-10-CM | POA: Diagnosis not present

## 2023-07-16 DIAGNOSIS — F32A Depression, unspecified: Secondary | ICD-10-CM | POA: Diagnosis not present

## 2023-07-16 DIAGNOSIS — J4520 Mild intermittent asthma, uncomplicated: Secondary | ICD-10-CM | POA: Diagnosis not present

## 2023-07-16 DIAGNOSIS — Z87442 Personal history of urinary calculi: Secondary | ICD-10-CM | POA: Diagnosis not present

## 2023-07-16 DIAGNOSIS — Z882 Allergy status to sulfonamides status: Secondary | ICD-10-CM

## 2023-07-16 DIAGNOSIS — K567 Ileus, unspecified: Secondary | ICD-10-CM | POA: Diagnosis not present

## 2023-07-16 DIAGNOSIS — R000 Tachycardia, unspecified: Secondary | ICD-10-CM | POA: Diagnosis not present

## 2023-07-16 DIAGNOSIS — J45909 Unspecified asthma, uncomplicated: Secondary | ICD-10-CM | POA: Diagnosis present

## 2023-07-16 DIAGNOSIS — R748 Abnormal levels of other serum enzymes: Secondary | ICD-10-CM | POA: Diagnosis present

## 2023-07-16 DIAGNOSIS — M549 Dorsalgia, unspecified: Secondary | ICD-10-CM | POA: Diagnosis not present

## 2023-07-16 DIAGNOSIS — R1012 Left upper quadrant pain: Secondary | ICD-10-CM | POA: Insufficient documentation

## 2023-07-16 DIAGNOSIS — K551 Chronic vascular disorders of intestine: Secondary | ICD-10-CM | POA: Diagnosis not present

## 2023-07-16 DIAGNOSIS — R112 Nausea with vomiting, unspecified: Secondary | ICD-10-CM | POA: Diagnosis present

## 2023-07-16 DIAGNOSIS — Q453 Other congenital malformations of pancreas and pancreatic duct: Secondary | ICD-10-CM

## 2023-07-16 DIAGNOSIS — Z79899 Other long term (current) drug therapy: Secondary | ICD-10-CM

## 2023-07-16 DIAGNOSIS — Z981 Arthrodesis status: Secondary | ICD-10-CM | POA: Diagnosis not present

## 2023-07-16 DIAGNOSIS — K3184 Gastroparesis: Secondary | ICD-10-CM | POA: Diagnosis not present

## 2023-07-16 DIAGNOSIS — F419 Anxiety disorder, unspecified: Secondary | ICD-10-CM | POA: Diagnosis not present

## 2023-07-16 DIAGNOSIS — R197 Diarrhea, unspecified: Secondary | ICD-10-CM | POA: Diagnosis not present

## 2023-07-16 DIAGNOSIS — Z90710 Acquired absence of both cervix and uterus: Secondary | ICD-10-CM

## 2023-07-16 DIAGNOSIS — Z888 Allergy status to other drugs, medicaments and biological substances status: Secondary | ICD-10-CM | POA: Diagnosis not present

## 2023-07-16 DIAGNOSIS — Z87892 Personal history of anaphylaxis: Secondary | ICD-10-CM | POA: Diagnosis not present

## 2023-07-16 DIAGNOSIS — E242 Drug-induced Cushing's syndrome: Secondary | ICD-10-CM | POA: Diagnosis present

## 2023-07-16 DIAGNOSIS — R1010 Upper abdominal pain, unspecified: Secondary | ICD-10-CM | POA: Diagnosis not present

## 2023-07-16 DIAGNOSIS — Z91041 Radiographic dye allergy status: Secondary | ICD-10-CM | POA: Diagnosis not present

## 2023-07-16 DIAGNOSIS — N8302 Follicular cyst of left ovary: Secondary | ICD-10-CM | POA: Diagnosis not present

## 2023-07-16 DIAGNOSIS — Z9884 Bariatric surgery status: Secondary | ICD-10-CM

## 2023-07-16 DIAGNOSIS — G901 Familial dysautonomia [Riley-Day]: Secondary | ICD-10-CM | POA: Diagnosis present

## 2023-07-16 DIAGNOSIS — Z9102 Food additives allergy status: Secondary | ICD-10-CM

## 2023-07-16 DIAGNOSIS — Z9109 Other allergy status, other than to drugs and biological substances: Secondary | ICD-10-CM

## 2023-07-16 DIAGNOSIS — R110 Nausea: Secondary | ICD-10-CM | POA: Diagnosis not present

## 2023-07-16 DIAGNOSIS — R109 Unspecified abdominal pain: Secondary | ICD-10-CM | POA: Diagnosis not present

## 2023-07-16 DIAGNOSIS — G90A Postural orthostatic tachycardia syndrome (POTS): Secondary | ICD-10-CM | POA: Diagnosis present

## 2023-07-16 DIAGNOSIS — E876 Hypokalemia: Secondary | ICD-10-CM | POA: Diagnosis present

## 2023-07-16 DIAGNOSIS — E249 Cushing's syndrome, unspecified: Secondary | ICD-10-CM | POA: Diagnosis not present

## 2023-07-16 DIAGNOSIS — Z9049 Acquired absence of other specified parts of digestive tract: Secondary | ICD-10-CM | POA: Diagnosis not present

## 2023-07-16 DIAGNOSIS — Z91013 Allergy to seafood: Secondary | ICD-10-CM

## 2023-07-16 DIAGNOSIS — G4700 Insomnia, unspecified: Secondary | ICD-10-CM | POA: Diagnosis not present

## 2023-07-16 DIAGNOSIS — E1143 Type 2 diabetes mellitus with diabetic autonomic (poly)neuropathy: Secondary | ICD-10-CM | POA: Diagnosis not present

## 2023-07-16 LAB — CBC
HCT: 35.9 % — ABNORMAL LOW (ref 36.0–46.0)
Hemoglobin: 11.7 g/dL — ABNORMAL LOW (ref 12.0–15.0)
MCH: 29.4 pg (ref 26.0–34.0)
MCHC: 32.6 g/dL (ref 30.0–36.0)
MCV: 90.2 fL (ref 80.0–100.0)
Platelets: 248 10*3/uL (ref 150–400)
RBC: 3.98 MIL/uL (ref 3.87–5.11)
RDW: 15.1 % (ref 11.5–15.5)
WBC: 4.6 10*3/uL (ref 4.0–10.5)
nRBC: 0 % (ref 0.0–0.2)

## 2023-07-16 LAB — URINALYSIS, ROUTINE W REFLEX MICROSCOPIC
Bilirubin Urine: NEGATIVE
Glucose, UA: NEGATIVE mg/dL
Hgb urine dipstick: NEGATIVE
Ketones, ur: NEGATIVE mg/dL
Leukocytes,Ua: NEGATIVE
Nitrite: NEGATIVE
Protein, ur: NEGATIVE mg/dL
Specific Gravity, Urine: 1.011 (ref 1.005–1.030)
pH: 6.5 (ref 5.0–8.0)

## 2023-07-16 LAB — COMPREHENSIVE METABOLIC PANEL
ALT: 17 U/L (ref 0–44)
AST: 18 U/L (ref 15–41)
Albumin: 4.2 g/dL (ref 3.5–5.0)
Alkaline Phosphatase: 59 U/L (ref 38–126)
Anion gap: 6 (ref 5–15)
BUN: 10 mg/dL (ref 6–20)
CO2: 29 mmol/L (ref 22–32)
Calcium: 8.5 mg/dL — ABNORMAL LOW (ref 8.9–10.3)
Chloride: 104 mmol/L (ref 98–111)
Creatinine, Ser: 0.93 mg/dL (ref 0.44–1.00)
GFR, Estimated: >60 mL/min (ref >60)
Glucose, Bld: 102 mg/dL — ABNORMAL HIGH (ref 70–99)
Potassium: 4 mmol/L (ref 3.5–5.1)
Sodium: 139 mmol/L (ref 135–145)
Total Bilirubin: 0.3 mg/dL (ref 0.3–1.2)
Total Protein: 7.2 g/dL (ref 6.5–8.1)

## 2023-07-16 LAB — LIPASE, BLOOD: Lipase: 29 U/L (ref 11–51)

## 2023-07-16 MED ORDER — ZOLPIDEM TARTRATE 5 MG PO TABS
5.0000 mg | ORAL_TABLET | Freq: Every day | ORAL | Status: DC
  Administered 2023-07-16 – 2023-07-23 (×8): 5 mg via ORAL
  Filled 2023-07-16 (×8): qty 1

## 2023-07-16 MED ORDER — HYDROMORPHONE HCL 1 MG/ML IJ SOLN
1.0000 mg | Freq: Once | INTRAMUSCULAR | Status: AC
  Administered 2023-07-16: 1 mg via INTRAVENOUS
  Filled 2023-07-16: qty 1

## 2023-07-16 MED ORDER — PROMETHAZINE HCL 25 MG/ML IJ SOLN
INTRAMUSCULAR | Status: AC
  Administered 2023-07-16: 25 mg
  Filled 2023-07-16: qty 1

## 2023-07-16 MED ORDER — TRAZODONE HCL 100 MG PO TABS: 100.0000 mg | ORAL_TABLET | Freq: Every evening | ORAL | Status: DC | PRN

## 2023-07-16 MED ORDER — HYDROMORPHONE HCL 1 MG/ML IJ SOLN
0.5000 mg | INTRAMUSCULAR | Status: DC | PRN
  Administered 2023-07-16 – 2023-07-24 (×39): 0.5 mg via INTRAVENOUS
  Filled 2023-07-16 (×40): qty 0.5

## 2023-07-16 MED ORDER — LEVALBUTEROL HCL 0.63 MG/3ML IN NEBU: 0.6300 mg | INHALATION_SOLUTION | Freq: Four times a day (QID) | RESPIRATORY_TRACT | Status: DC | PRN

## 2023-07-16 MED ORDER — IOHEXOL 300 MG/ML  SOLN
80.0000 mL | Freq: Once | INTRAMUSCULAR | Status: AC | PRN
  Administered 2023-07-16: 80 mL via INTRAVENOUS

## 2023-07-16 MED ORDER — ACEBUTOLOL HCL 200 MG PO CAPS
200.0000 mg | ORAL_CAPSULE | Freq: Two times a day (BID) | ORAL | Status: DC
  Administered 2023-07-16 – 2023-07-24 (×16): 200 mg via ORAL
  Filled 2023-07-16 (×17): qty 1

## 2023-07-16 MED ORDER — BUSPIRONE HCL 10 MG PO TABS
20.0000 mg | ORAL_TABLET | Freq: Three times a day (TID) | ORAL | Status: DC
  Administered 2023-07-16 – 2023-07-24 (×24): 20 mg via ORAL
  Filled 2023-07-16 (×2): qty 4
  Filled 2023-07-16: qty 2
  Filled 2023-07-16: qty 4
  Filled 2023-07-16: qty 2
  Filled 2023-07-16: qty 4
  Filled 2023-07-16 (×2): qty 2
  Filled 2023-07-16: qty 4
  Filled 2023-07-16: qty 2
  Filled 2023-07-16: qty 4
  Filled 2023-07-16 (×11): qty 2
  Filled 2023-07-16: qty 4
  Filled 2023-07-16 (×2): qty 2

## 2023-07-16 MED ORDER — IVABRADINE HCL 7.5 MG PO TABS
7.5000 mg | ORAL_TABLET | Freq: Two times a day (BID) | ORAL | Status: DC
  Filled 2023-07-16: qty 1

## 2023-07-16 MED ORDER — ONDANSETRON HCL 4 MG/2ML IJ SOLN
4.0000 mg | Freq: Four times a day (QID) | INTRAMUSCULAR | Status: DC | PRN
  Administered 2023-07-16 – 2023-07-20 (×5): 4 mg via INTRAVENOUS
  Filled 2023-07-16 (×5): qty 2

## 2023-07-16 MED ORDER — SODIUM CHLORIDE 0.9 % IV SOLN
25.0000 mg | Freq: Three times a day (TID) | INTRAVENOUS | Status: DC | PRN
  Administered 2023-07-16 – 2023-07-24 (×18): 25 mg via INTRAVENOUS
  Filled 2023-07-16 (×7): qty 25
  Filled 2023-07-16 (×2): qty 1
  Filled 2023-07-16 (×10): qty 25

## 2023-07-16 MED ORDER — LACTATED RINGERS IV BOLUS
1000.0000 mL | Freq: Once | INTRAVENOUS | Status: AC
  Administered 2023-07-16: 1000 mL via INTRAVENOUS

## 2023-07-16 MED ORDER — IVABRADINE 2.5 MG HALF TABLET
2.5000 mg | ORAL_TABLET | Freq: Two times a day (BID) | ORAL | Status: DC
  Administered 2023-07-16 – 2023-07-24 (×16): 2.5 mg via ORAL
  Filled 2023-07-16 (×20): qty 1

## 2023-07-16 MED ORDER — SODIUM CHLORIDE 0.9 % IV SOLN
25.0000 mg | Freq: Once | INTRAVENOUS | Status: AC
  Administered 2023-07-16: 25 mg via INTRAVENOUS
  Filled 2023-07-16: qty 1

## 2023-07-16 MED ORDER — ENOXAPARIN SODIUM 40 MG/0.4ML IJ SOSY
40.0000 mg | PREFILLED_SYRINGE | INTRAMUSCULAR | Status: DC
  Administered 2023-07-16 – 2023-07-23 (×8): 40 mg via SUBCUTANEOUS
  Filled 2023-07-16 (×8): qty 0.4

## 2023-07-16 MED ORDER — IVABRADINE HCL 5 MG PO TABS
5.0000 mg | ORAL_TABLET | Freq: Two times a day (BID) | ORAL | Status: DC
  Administered 2023-07-16 – 2023-07-24 (×16): 5 mg via ORAL
  Filled 2023-07-16 (×20): qty 1

## 2023-07-16 MED ORDER — LAMOTRIGINE 100 MG PO TABS
100.0000 mg | ORAL_TABLET | Freq: Every day | ORAL | Status: DC
  Administered 2023-07-16 – 2023-07-23 (×8): 100 mg via ORAL
  Filled 2023-07-16 (×8): qty 1

## 2023-07-16 MED ORDER — PANTOPRAZOLE SODIUM 40 MG PO TBEC
40.0000 mg | DELAYED_RELEASE_TABLET | Freq: Every day | ORAL | Status: DC
  Administered 2023-07-16 – 2023-07-24 (×9): 40 mg via ORAL
  Filled 2023-07-16 (×9): qty 1

## 2023-07-16 MED ORDER — SODIUM CHLORIDE 0.9 % IV SOLN: INTRAVENOUS | Status: AC

## 2023-07-16 MED ORDER — DESIPRAMINE HCL 25 MG PO TABS
25.0000 mg | ORAL_TABLET | Freq: Every day | ORAL | Status: DC
  Administered 2023-07-16 – 2023-07-23 (×8): 25 mg via ORAL
  Filled 2023-07-16 (×9): qty 1

## 2023-07-16 MED ORDER — MONTELUKAST SODIUM 10 MG PO TABS
10.0000 mg | ORAL_TABLET | Freq: Every day | ORAL | Status: DC
  Administered 2023-07-16 – 2023-07-23 (×8): 10 mg via ORAL
  Filled 2023-07-16 (×8): qty 1

## 2023-07-16 MED ORDER — DILTIAZEM HCL ER COATED BEADS 180 MG PO CP24
180.0000 mg | ORAL_CAPSULE | Freq: Every day | ORAL | Status: DC
  Administered 2023-07-17 – 2023-07-24 (×8): 180 mg via ORAL
  Filled 2023-07-16 (×8): qty 1

## 2023-07-16 MED ORDER — FAMOTIDINE IN NACL 20-0.9 MG/50ML-% IV SOLN
20.0000 mg | Freq: Once | INTRAVENOUS | Status: AC
  Administered 2023-07-16: 20 mg via INTRAVENOUS
  Filled 2023-07-16: qty 50

## 2023-07-16 NOTE — ED Notes (Signed)
Kim with [facility] called for transport

## 2023-07-16 NOTE — ED Triage Notes (Signed)
Pt arrived POV with LUQ pain that radiates to her back. Started last week and has progressively getting worse. N/V, no diarrhea. Hx of pancreatitis. Takes Zofran at home with little relief. Last eaten Sunday night.

## 2023-07-16 NOTE — ED Provider Notes (Addendum)
 [place] [facility]ENC[facility]PARTMENT AT [facility] Provider Note   CSN: [id number] Arrival date & time: 07/16/23  0841     History  Chief Complaint  Patient presents with   Abdominal Pain    Kaitlyn Johnson is a 42 y.o. female with abdominal pain in the LUQ with radiation to the back that started about 5 days ago. Taking Zofran  without much relief. No diarrhea. Nausea and vomiting- has not eaten since Sunday. She spoke with her PCP who recommended coming to the ED. Feels remniscent of pancreatitis in the past. No fevers or chills. No recent travel or sick contacts. No dysuria, hematuria.  Pertinent PMHx: SMA syndrome with subsequent Roux-en-Y duodenojejunostomy (2000), recurrenr pancreatitis , sphincter of Oddi dysfunction s/p biliary sphincterotomy (2013) and stent placement (2015), endometriosis s/p hysterectomy       Home Medications Prior to Admission medications   Medication Sig Start Date End Date Taking? Authorizing Provider  ABILIFY  MAINTENA 400 MG SRER injection Inject 400 mg into the muscle every 28 (twenty-eight) days. 05/25/21   [provider]  acebutolol  (SECTRAL ) 200 MG capsule Take 1 capsule (200 mg total) by mouth 2 (two) times daily. 11/05/22   Ladona Heinz, MD  acetaminophen  (TYLENOL ) 500 MG tablet Take 2 tablets (1,000 mg total) by mouth every 8 (eight) hours. 08/25/18   Meuth, Lyle LABOR, PA-C  albuterol  (VENTOLIN  HFA) 108 (90 Base) MCG/ACT inhaler Inhale 1 puff into the lungs as needed for wheezing or shortness of breath. 01/20/15   [provider]  AMBIEN  CR 12.5 MG CR tablet Take 12.5 mg by mouth at bedtime. 10/31/15   [provider]  busPIRone  (BUSPAR ) 10 MG tablet Take 20 mg by mouth 3 (three) times daily. 02/20/21   [provider]  clonazePAM  (KLONOPIN ) 0.5 MG tablet Take 0.5 mg by mouth 2 (two) times daily as needed for anxiety.    [provider]  cyanocobalamin  (VITAMIN B12) 1000 MCG/ML injection Inject  1,000 mcg into the skin every 30 (thirty) days. 01/06/16   [provider]  desipramine  (NORPRAMIN ) 25 MG tablet Take 25 mg by mouth at bedtime. 10/09/22   [provider]  DEXILANT 30 MG capsule Take 30 mg by mouth 2 (two) times daily. 03/29/21   [provider]  diltiazem  (CARDIZEM  CD) 180 MG 24 hr capsule TAKE ONE CAPSULE BY MOUTH EVERY DAY 07/11/22   Ladona Heinz, MD  EPINEPHrine  0.3 mg/0.3 mL IJ SOAJ injection Inject 0.3 mg into the muscle as needed for anaphylaxis. 06/10/23   Sebastian Beverley NOVAK, MD  fluticasone (FLONASE) 50 MCG/ACT nasal spray Place 2 sprays into both nostrils daily.    [provider]  ipratropium (ATROVENT ) 0.03 % nasal spray Place 2 sprays into both nostrils every 12 (twelve) hours. 04/19/23   Sebastian Beverley NOVAK, MD  ivabradine  (CORLANOR ) 7.5 MG TABS tablet Take 1 tablet (7.5 mg total) by mouth 2 (two) times daily with a meal. 04/08/23   Ladona Heinz, MD  lamoTRIgine  (LAMICTAL ) 100 MG tablet Take 100 mg by mouth at bedtime. 09/18/21   [provider]  levalbuterol  (XOPENEX  HFA) 45 MCG/ACT inhaler Inhale 1 puff into the lungs as needed for wheezing or shortness of breath.    [provider]  levalbuterol  (XOPENEX ) 0.63 MG/3ML nebulizer solution Take 0.63 mg by nebulization every 6 (six) hours as needed for wheezing or shortness of breath.    [provider]  montelukast  (SINGULAIR ) 10 MG tablet Take 1 tablet (10 mg total) by mouth  at bedtime. 06/13/23   Sebastian Beverley NOVAK, MD  ondansetron  (ZOFRAN -ODT) 4 MG disintegrating tablet Take 1 tablet (4 mg total) by mouth every 8 (eight) hours as needed for nausea or vomiting. 01/02/23   Curatolo, Adam, DO  ondansetron  (ZOFRAN -ODT) 4 MG disintegrating tablet Take 4 mg by mouth every 8 (eight) hours as needed for nausea or vomiting. 08/25/18   [provider]  Pseudoephedrine-Ibuprofen  (ADVIL  COLD/SINUS PO) Take by mouth.    [provider]  traZODone  (DESYREL ) 100 MG  tablet Take 100-300 mg by mouth at bedtime as needed for sleep.    [provider]      Allergies    Bee venom; Reglan  [metoclopramide ]; Shellfish allergy; Adhesive [tape]; Measles, mumps & rubella vac; Sulfa antibiotics; and Yellow dye    Review of Systems   Review of Systems  Constitutional:  Negative for chills and fever.  Gastrointestinal:  Positive for abdominal pain, nausea and vomiting.  Genitourinary:  Negative for dysuria and flank pain.    Physical Exam Updated Vital Signs BP (!) 165/87   Pulse (!) 58   Temp 97.8 F (36.6 C)   Resp 13   Ht 5' 9 (1.753 m)   Wt 90.7 kg   SpO2 100%   BMI 29.53 kg/m  Physical Exam Constitutional:      General: She is not in acute distress. HENT:     Mouth/Throat:     Comments: Dry MM Cardiovascular:     Rate and Rhythm: Normal rate and regular rhythm.  Pulmonary:     Effort: Pulmonary effort is normal.     Breath sounds: Normal breath sounds. No wheezing or rhonchi.  Abdominal:     General: Abdomen is flat. Bowel sounds are normal.     Palpations: Abdomen is soft.     Tenderness: There is abdominal tenderness in the epigastric area and left upper quadrant.  Skin:    General: Skin is warm and dry.  Neurological:     General: No focal deficit present.     Mental Status: She is alert and oriented to person, place, and time.     ED Results / Procedures / Treatments   Labs (all labs ordered are listed, but only abnormal results are displayed) Labs Reviewed  COMPREHENSIVE METABOLIC PANEL - Abnormal; Notable for the following components:      Result Value   Glucose, Bld 102 (*)    Calcium  8.5 (*)    All other components within normal limits  CBC - Abnormal; Notable for the following components:   Hemoglobin 11.7 (*)    HCT 35.9 (*)    All other components within normal limits  URINALYSIS, ROUTINE W REFLEX MICROSCOPIC - Abnormal; Notable for the following components:   Color, Urine COLORLESS (*)    All other  components within normal limits  LIPASE, BLOOD    EKG None  Radiology CT ABDOMEN PELVIS W CONTRAST  Result Date: 07/16/2023 CLINICAL DATA:  Worsening left upper quadrant pain and left back pain for approximately 1 week. Nausea and vomiting. EXAM: CT ABDOMEN AND PELVIS WITH CONTRAST TECHNIQUE: Multidetector CT imaging of the abdomen and pelvis was performed using the standard protocol following bolus administration of intravenous contrast. RADIATION DOSE REDUCTION: This exam was performed according to the departmental dose-optimization program which includes automated exposure control, adjustment of the mA and/or kV according to patient size and/or use of iterative reconstruction technique. CONTRAST:  80mL OMNIPAQUE  IOHEXOL  300 MG/ML  SOLN COMPARISON:  01/02/2023 and 08/18/2018 FINDINGS:  Lower Chest: No acute findings. Hepatobiliary: No suspicious hepatic masses identified. Prior cholecystectomy. No evidence of biliary obstruction. Pancreas:  No mass or inflammatory changes. Spleen: Within normal limits in size and appearance. Adrenals/Urinary Tract: No suspicious masses identified. No evidence of ureteral calculi or hydronephrosis. Stomach/Bowel: Prior sleeve gastrectomy noted. No evidence of obstruction, inflammatory process or abnormal fluid collections. Vascular/Lymphatic: No pathologically enlarged lymph nodes. No acute vascular findings. Reproductive: Prior hysterectomy. Adnexal regions are unremarkable. Other:  None. Musculoskeletal:  No suspicious bone lesions identified. IMPRESSION: No acute findings or other significant abnormality. Electronically Signed   By: Norleen DELENA Kil M.D.   On: 07/16/2023 11:46    Procedures Procedures    Medications Ordered in ED Medications  HYDROmorphone  (DILAUDID ) injection 1 mg (has no administration in time range)  promethazine  (PHENERGAN ) 25 mg in sodium chloride  0.9 % 50 mL IVPB (0 mg Intravenous Stopped 07/16/23 1024)  lactated ringers  bolus 1,000 mL (0 mLs  Intravenous Stopped 07/16/23 1100)  promethazine  (PHENERGAN ) 25 MG/ML injection (25 mg  Given by Other 07/16/23 0958)  HYDROmorphone  (DILAUDID ) injection 1 mg (1 mg Intravenous Given 07/16/23 1031)  famotidine  (PEPCID ) IVPB 20 mg premix (0 mg Intravenous Stopped 07/16/23 1100)  iohexol  (OMNIPAQUE ) 300 MG/ML solution 80 mL (80 mLs Intravenous Contrast Given 07/16/23 1011)    ED Course/ Medical Decision Making/ A&P                                 Medical Decision Making 42 y/o F with epigastric/LUQ abdominal pain for 5 days. Generally well-appearing in no distress. TTP in LUQ and epigastrium.Likely gastritis with chronic gastroparesis.    History of multiple abdominal surgeries. CT abdomen/pelvis negative for obstruction, pancreatitis or other acute abnormality. CBC and CMP generally within normal limits. Lipase normal.  Treated with IV fluids, IV dilaudid , IV phenergan  and famotidine . Patient felt she would not be able to manage her pain well at home with this bout of illness, and was unable to tolerate PO intake well after IV anti-emetic, pain medication.  Admit to hospital ist for further observation and symptomatic management.  Amount and/or Complexity of Data Reviewed Labs: ordered. Radiology: ordered.  Risk Prescription drug management. Decision regarding hospitalization.        Final Clinical Impression(s) / ED Diagnoses Final diagnoses:  Gastroparesis    Rx / DC Orders ED Discharge Orders     None         Dartha Geralds, DO 07/16/23 1312    Dartha Geralds, DO 07/16/23 2131    Jerrol Agent, MD 07/17/23 2324

## 2023-07-16 NOTE — ED Notes (Signed)
Report given to the next RN.Marland KitchenMarland Kitchen

## 2023-07-16 NOTE — H&P (Signed)
History and Physical    Kaitlyn Johnson BJY:[id number] DOB: Mar 29, 1981 DOA: 07/16/2023  PCP: Garnette Gunner, MD Patient coming from: Home  Chief Complaint: Nausea and abdominal pain  HPI: Kaitlyn Johnson 42 year old F with PMH of gastroparesis, sleeve gastrectomy in 2019, POTS, asthma, anxiety, mood disorder, pancreatitis, prior cervical or lumbar spine surgery presenting with nausea and abdominal pain for 6 days.  Patient reports progressive nausea and left upper quadrant abdominal pain for the last 6 days.  She had an episode of emesis yesterday morning.  She tried to manage nausea and emesis with bland diet, p.o. Zofran Dexilant and desipramine without success.  She was not able to tolerate any p.o. for the last 2 days and eventually decided to go to ED. last emesis was this morning.  Emesis was nonbloody.  She describes the pain as constant and intermittently stabbing radiating to her back at times.  She rates her pain 6/10 at its worst.  Currently her pain is at 5/10.  She reports regular bowel movements yesterday.  Denies diarrhea or constipation.  Denies runny nose, sore throat, chest pain, shortness of breath, cough, fever, chills or UTI symptoms.  She denies focal neurosymptoms.  Patient denies smoking cigarette.  Admits to social drinking but denies heavy drinking.  Denies recreational drug use.  Likes to have cardiopulmonary resuscitation in event of sudden cardiopulmonary arrest.  In ED, stable vitals.  CBC, CMP, lipase, CT abdomen and pelvis and urinalysis without significant finding.  Received IV Dilaudid, Phenergan, Pepcid and LR bolus.  Admission accepted for intractable nausea and abdominal pain.  ROS All review of system negative except for pertinent positives and negatives as history of present illness above.  PMH Past Medical History:  Diagnosis Date   Acute appendicitis 02/28/2017   Acute pancreatitis 11/30/2013   Anemia 11/26/2011   Anxiety    Asthma     Celiac and mesenteric artery injury    Cushing's syndrome (HCC)    06/21/16- "in remission"   Depression    H/O hiatal hernia    History of kidney stones    Iatrogenic Cushing's syndrome (HCC) 10/02/2013   Nausea vomiting and diarrhea 11/23/2013   Nausea with vomiting 11/30/2013   Palpitations 12/05/2018   Pancreatitis 11/29/2013   POTS (postural orthostatic tachycardia syndrome)    Shortness of breath dyspnea    with exertertion   Sphincter of Oddi dysfunction    PSH Past Surgical History:  Procedure Laterality Date   ABDOMINAL HYSTERECTOMY  08/2010   ANTERIOR CERVICAL DECOMP/DISCECTOMY FUSION N/A 03/31/2013   Procedure: ANTERIOR CERVICAL DECOMPRESSION/DISCECTOMY FUSION 1 LEVEL Cervical five-six;  Surgeon: Reinaldo Meeker, MD;  Location: [facility];  Service: Neurosurgery;  Laterality: N/A;   APPENDECTOMY     BACK SURGERY     Cholangio-Pancreatography with Spintectorotomy + Stent  07/16/2012   01/15/14   CHOLECYSTECTOMY     ESOPHAGOGASTRODUODENOSCOPY (EGD) WITH PROPOFOL N/A 08/19/2018   Procedure: ESOPHAGOGASTRODUODENOSCOPY (EGD) WITH PROPOFOL;  Surgeon: Charlott Rakes, MD;  Location: [facility];  Service: Endoscopy;  Laterality: N/A;   LAPAROSCOPIC APPENDECTOMY N/A 02/28/2017   Procedure: APPENDECTOMY LAPAROSCOPIC;  Surgeon: Harriette Bouillon, MD;  Location: [facility];  Service: General;  Laterality: N/A;   LAPAROSCOPIC ENDOMETRIOSIS FULGURATION     LAPAROSCOPIC GASTRIC SLEEVE RESECTION N/A 07/15/2018   Procedure: LAPAROSCOPIC GASTRIC SLEEVE RESECTION, UPPER ENDO, ERAS Pathway;  Surgeon: Luretha Keadle, MD;  Location: [facility];  Service: General;  Laterality: N/A;   LUMBAR LAMINECTOMY     LUMBAR LAMINECTOMY/DECOMPRESSION  MICRODISCECTOMY Left 06/22/2016   Procedure: MICRODISCECTOMY LEFT LUMBAR FOUR-FIVE;  Surgeon: Lisbeth Renshaw, MD;  Location: [facility];  Service: Neurosurgery;  Laterality: Left;   ROUX-EN-Y PROCEDURE  04/1999   Fam HX Family History  Problem  Relation Age of Onset   Hypertension Mother     Social Hx  reports that she has never smoked. She has never been exposed to tobacco smoke. She has never used smokeless tobacco. She reports current alcohol use of about 5.0 standard drinks of alcohol per week. She reports that she does not use drugs.  Allergy Allergies  Allergen Reactions   Bee Venom Anaphylaxis   Reglan [Metoclopramide] Other (See Comments)    Reaction:  Oculogyric crisis    Shellfish Allergy Anaphylaxis   Adhesive [Tape] Hives   Measles, Mumps & Rubella Vac     Other reaction(s): slept for 28 hrs   Sulfa Antibiotics Rash   Yellow Dye Nausea Only and Rash    Occurred from oral iron with yellow enteric coating Has not tried any other oral iron preparation (so can't rule out any other excipient as causative agent)   Home Meds Prior to Admission medications   Medication Sig Start Date End Date Taking? Authorizing Provider  ABILIFY MAINTENA 400 MG SRER injection Inject 400 mg into the muscle every 28 (twenty-eight) days. 05/25/21  Yes [provider]  acebutolol (SECTRAL) 200 MG capsule Take 1 capsule (200 mg total) by mouth 2 (two) times daily. 11/05/22  Yes Yates Decamp, MD  albuterol (VENTOLIN HFA) 108 (90 Base) MCG/ACT inhaler Inhale 1 puff into the lungs as needed for wheezing or shortness of breath. 01/20/15  Yes [provider]  AMBIEN CR 12.5 MG CR tablet Take 12.5 mg by mouth at bedtime. 10/31/15  Yes [provider]  busPIRone (BUSPAR) 10 MG tablet Take 20 mg by mouth 3 (three) times daily. 02/20/21  Yes [provider]  clonazePAM (KLONOPIN) 0.5 MG tablet Take 0.5 mg by mouth 2 (two) times daily as needed for anxiety.   Yes [provider]  cyanocobalamin (VITAMIN B12) 1000 MCG/ML injection Inject 1,000 mcg into the skin every 30 (thirty) days. 01/06/16  Yes [provider]  desipramine (NORPRAMIN) 25 MG tablet Take 25 mg by mouth at bedtime. 10/09/22  Yes [provider]  DEXILANT 30 MG capsule Take 30 mg by mouth 2 (two) times daily. 03/29/21  Yes [provider]  diltiazem (CARDIZEM CD) 180 MG 24 hr capsule TAKE ONE CAPSULE BY MOUTH EVERY DAY 07/11/22  Yes Yates Decamp, MD  EPINEPHrine 0.3 mg/0.3 mL IJ SOAJ injection Inject 0.3 mg into the muscle as needed for anaphylaxis. 06/10/23  Yes Garnette Gunner, MD  ivabradine (CORLANOR) 7.5 MG TABS tablet Take 1 tablet (7.5 mg total) by mouth 2 (two) times daily with a meal. 04/08/23  Yes Yates Decamp, MD  lamoTRIgine (LAMICTAL) 100 MG tablet Take 100 mg by mouth at bedtime. 09/18/21  Yes [provider]  levalbuterol (XOPENEX HFA) 45 MCG/ACT inhaler Inhale 1 puff into the lungs as needed for wheezing or shortness of breath.   Yes [provider]  levalbuterol (XOPENEX) 0.63 MG/3ML nebulizer solution Take 0.63 mg by nebulization every 6 (six) hours as needed for wheezing or shortness of breath.   Yes [provider]  montelukast (SINGULAIR) 10 MG tablet Take 1 tablet (10 mg total) by mouth at bedtime. 06/13/23  Yes Garnette Gunner, MD  ondansetron (ZOFRAN-ODT) 4 MG disintegrating tablet Take 1  tablet (4 mg total) by mouth every 8 (eight) hours as needed for nausea or vomiting. 01/02/23  Yes Curatolo, Adam, DO  traZODone (DESYREL) 100 MG tablet Take 100-300 mg by mouth at bedtime as needed for sleep.   Yes [provider]  acetaminophen (TYLENOL) 500 MG tablet Take 2 tablets (1,000 mg total) by mouth every 8 (eight) hours. Patient not taking: Reported on 07/16/2023 08/25/18   Carlena Bjornstad A, PA-C  ipratropium (ATROVENT) 0.03 % nasal spray Place 2 sprays into both nostrils every 12 (twelve) hours. Patient not taking: Reported on 07/16/2023 04/19/23   Garnette Gunner, MD    Physical Exam: Vitals:   07/16/23 1500 07/16/23 1530 07/16/23 1623 07/16/23 1627  BP: 113/73 117/68  134/86  Pulse: 61 (!) 58  62  Resp: 15 (!) 25    Temp:    (!) 97.5 F (36.4 C)  TempSrc:     Oral  SpO2: 99% 97%  100%  Weight:   92.1 kg   Height:   5\' 9"  (1.753 m)     GENERAL: No acute distress.  Appears well.  HEENT: MMM.  Vision and hearing grossly intact.  NECK: Supple.  No apparent JVD.  RESP:  No IWOB. Good air movement bilaterally. CVS:  RRR. Heart sounds normal.  ABD/GI/GU: Bowel sounds present. Soft.  Mild LUQ tenderness. MSK/EXT:  Moves extremities. No apparent deformity or edema.  SKIN: no apparent skin lesion or wound NEURO: Awake, alert and oriented appropriately.  No gross deficit.  PSYCH: Calm. Normal affect.   Personally Reviewed Radiological Exams See HPI   Personally Reviewed Labs: CBC: Recent Labs  Lab 07/16/23 0909  WBC 4.6  HGB 11.7*  HCT 35.9*  MCV 90.2  PLT 248   Basic Metabolic Panel: Recent Labs  Lab 07/16/23 0909  NA 139  K 4.0  CL 104  CO2 29  GLUCOSE 102*  BUN 10  CREATININE 0.93  CALCIUM 8.5*   GFR: Estimated Creatinine Clearance: 98.2 mL/min (by C-G formula based on SCr of 0.93 mg/dL). Liver Function Tests: Recent Labs  Lab 07/16/23 0909  AST 18  ALT 17  ALKPHOS 59  BILITOT 0.3  PROT 7.2  ALBUMIN 4.2   Recent Labs  Lab 07/16/23 0909  LIPASE 29   No results for input(s): "AMMONIA" in the last 168 hours. Coagulation Profile: No results for input(s): "INR", "PROTIME" in the last 168 hours. Cardiac Enzymes: No results for input(s): "CKTOTAL", "CKMB", "CKMBINDEX", "TROPONINI" in the last 168 hours. BNP (last 3 results) No results for input(s): "PROBNP" in the last 8760 hours. HbA1C: No results for input(s): "HGBA1C" in the last 72 hours. CBG: No results for input(s): "GLUCAP" in the last 168 hours. Lipid Profile: No results for input(s): "CHOL", "HDL", "LDLCALC", "TRIG", "CHOLHDL", "LDLDIRECT" in the last 72 hours. Thyroid Function Tests: No results for input(s): "TSH", "T4TOTAL", "FREET4", "T3FREE", "THYROIDAB" in the last 72 hours. Anemia Panel: No results for input(s): "VITAMINB12", "FOLATE",  "FERRITIN", "TIBC", "IRON", "RETICCTPCT" in the last 72 hours. Urine analysis:    Component Value Date/Time   COLORURINE COLORLESS (A) 07/16/2023 0959   APPEARANCEUR CLEAR 07/16/2023 0959   LABSPEC 1.011 07/16/2023 0959   PHURINE 6.5 07/16/2023 0959   GLUCOSEU NEGATIVE 07/16/2023 0959   HGBUR NEGATIVE 07/16/2023 0959   BILIRUBINUR NEGATIVE 07/16/2023 0959   KETONESUR NEGATIVE 07/16/2023 0959   PROTEINUR NEGATIVE 07/16/2023 0959   UROBILINOGEN 0.2 02/05/2015 1855   NITRITE NEGATIVE 07/16/2023 0959   LEUKOCYTESUR NEGATIVE 07/16/2023 4782  Assessment and plan: Principal Problem:   Intractable nausea and vomiting Active Problems:   Asthma, chronic   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   Anxiety   Gastroparesis   Intractable nausea   LUQ abdominal pain  Intractable nausea and vomiting with abdominal pain in patient with history of gastroparesis: Last emesis earlier this morning.  Abdominal exam benign except for mild LUQ tenderness.  CMP, CBC, UA, lipase and CT abdomen and pelvis without significant finding to explain patient's symptoms.  No emesis in ED but not able to tolerate p.o. on p.o. challenge.  Unfortunately, she is allergic to Reglan.  -IV fluid, IV Zofran +/- IV Phenergan  -Clear liquid diet.  P.o. Protonix.  IV Dilaudid for pain control  POTS -Continue home Corlanor, Cardizem  History of asthma: Stable -Xopenex as needed -Continue home Singulair  History of sleeve gastrectomy-noted  Anxiety/insomnia/mood disorder: Gets monthly Abilify injection. -Continue home BuSpar, Klonopin, Ambien, Lamictal and trazodone  Chronic back pain/history of cervical and lumbar spine surgeries: Stable   DVT prophylaxis: Subcu Lovenox  Code Status: Full code.  Discussed with patient. Family Communication: None at bedside  Consults called: None Admission status: Observation   Almon Hercules MD [facility]  If 7PM-7AM, please contact  night-coverage [email or web address]  07/16/2023, 5:17 PM

## 2023-07-17 DIAGNOSIS — Z882 Allergy status to sulfonamides status: Secondary | ICD-10-CM | POA: Diagnosis not present

## 2023-07-17 DIAGNOSIS — Z9884 Bariatric surgery status: Secondary | ICD-10-CM | POA: Diagnosis not present

## 2023-07-17 DIAGNOSIS — F32A Depression, unspecified: Secondary | ICD-10-CM | POA: Diagnosis present

## 2023-07-17 DIAGNOSIS — Z887 Allergy status to serum and vaccine status: Secondary | ICD-10-CM | POA: Diagnosis not present

## 2023-07-17 DIAGNOSIS — Z9102 Food additives allergy status: Secondary | ICD-10-CM | POA: Diagnosis not present

## 2023-07-17 DIAGNOSIS — M549 Dorsalgia, unspecified: Secondary | ICD-10-CM | POA: Diagnosis present

## 2023-07-17 DIAGNOSIS — G901 Familial dysautonomia [Riley-Day]: Secondary | ICD-10-CM | POA: Diagnosis present

## 2023-07-17 DIAGNOSIS — N8302 Follicular cyst of left ovary: Secondary | ICD-10-CM | POA: Diagnosis not present

## 2023-07-17 DIAGNOSIS — K567 Ileus, unspecified: Secondary | ICD-10-CM | POA: Diagnosis present

## 2023-07-17 DIAGNOSIS — Q453 Other congenital malformations of pancreas and pancreatic duct: Secondary | ICD-10-CM | POA: Diagnosis not present

## 2023-07-17 DIAGNOSIS — Z87442 Personal history of urinary calculi: Secondary | ICD-10-CM | POA: Diagnosis not present

## 2023-07-17 DIAGNOSIS — R110 Nausea: Secondary | ICD-10-CM | POA: Diagnosis not present

## 2023-07-17 DIAGNOSIS — E242 Drug-induced Cushing's syndrome: Secondary | ICD-10-CM | POA: Diagnosis present

## 2023-07-17 DIAGNOSIS — R748 Abnormal levels of other serum enzymes: Secondary | ICD-10-CM | POA: Diagnosis present

## 2023-07-17 DIAGNOSIS — J45909 Unspecified asthma, uncomplicated: Secondary | ICD-10-CM | POA: Diagnosis present

## 2023-07-17 DIAGNOSIS — J4520 Mild intermittent asthma, uncomplicated: Secondary | ICD-10-CM | POA: Diagnosis not present

## 2023-07-17 DIAGNOSIS — R112 Nausea with vomiting, unspecified: Secondary | ICD-10-CM | POA: Diagnosis present

## 2023-07-17 DIAGNOSIS — K3184 Gastroparesis: Secondary | ICD-10-CM | POA: Diagnosis not present

## 2023-07-17 DIAGNOSIS — F419 Anxiety disorder, unspecified: Secondary | ICD-10-CM | POA: Diagnosis not present

## 2023-07-17 DIAGNOSIS — Z9049 Acquired absence of other specified parts of digestive tract: Secondary | ICD-10-CM | POA: Diagnosis not present

## 2023-07-17 DIAGNOSIS — Z91030 Bee allergy status: Secondary | ICD-10-CM | POA: Diagnosis not present

## 2023-07-17 DIAGNOSIS — R197 Diarrhea, unspecified: Secondary | ICD-10-CM | POA: Diagnosis not present

## 2023-07-17 DIAGNOSIS — G4700 Insomnia, unspecified: Secondary | ICD-10-CM | POA: Diagnosis present

## 2023-07-17 DIAGNOSIS — Z888 Allergy status to other drugs, medicaments and biological substances status: Secondary | ICD-10-CM | POA: Diagnosis not present

## 2023-07-17 DIAGNOSIS — G90A Postural orthostatic tachycardia syndrome (POTS): Secondary | ICD-10-CM | POA: Diagnosis present

## 2023-07-17 DIAGNOSIS — Z981 Arthrodesis status: Secondary | ICD-10-CM | POA: Diagnosis not present

## 2023-07-17 DIAGNOSIS — Z90710 Acquired absence of both cervix and uterus: Secondary | ICD-10-CM | POA: Diagnosis not present

## 2023-07-17 DIAGNOSIS — E876 Hypokalemia: Secondary | ICD-10-CM | POA: Diagnosis present

## 2023-07-17 DIAGNOSIS — G8929 Other chronic pain: Secondary | ICD-10-CM | POA: Diagnosis present

## 2023-07-17 DIAGNOSIS — Z91013 Allergy to seafood: Secondary | ICD-10-CM | POA: Diagnosis not present

## 2023-07-17 DIAGNOSIS — R1012 Left upper quadrant pain: Secondary | ICD-10-CM | POA: Diagnosis not present

## 2023-07-17 DIAGNOSIS — R109 Unspecified abdominal pain: Secondary | ICD-10-CM | POA: Diagnosis not present

## 2023-07-17 LAB — LIPASE, BLOOD: Lipase: 32 U/L (ref 11–51)

## 2023-07-17 MED ORDER — DICYCLOMINE HCL 10 MG PO CAPS
10.0000 mg | ORAL_CAPSULE | Freq: Three times a day (TID) | ORAL | Status: DC | PRN
  Administered 2023-07-17 – 2023-07-24 (×4): 10 mg via ORAL
  Filled 2023-07-17 (×4): qty 1

## 2023-07-17 MED ORDER — POTASSIUM CHLORIDE 10 MEQ/100ML IV SOLN
10.0000 meq | INTRAVENOUS | Status: AC
  Administered 2023-07-17 (×3): 10 meq via INTRAVENOUS
  Filled 2023-07-17: qty 100

## 2023-07-17 NOTE — Plan of Care (Signed)
Problem: Education: Goal: Knowledge of General Education information will improve Description: Including pain rating scale, medication(s)/side effects and non-pharmacologic comfort measures Outcome: Progressing   Problem: Clinical Measurements: Goal: Ability to maintain clinical measurements within normal limits will improve Outcome: Progressing Goal: Will remain free from infection Outcome: Progressing Goal: Diagnostic test results will improve Outcome: Progressing Goal: Respiratory complications will improve Outcome: Progressing Goal: Cardiovascular complication will be avoided Outcome: Progressing   Problem: Activity: Goal: Risk for activity intolerance will decrease Outcome: Progressing   Problem: Elimination: Goal: Will not experience complications related to bowel motility Outcome: Progressing Goal: Will not experience complications related to urinary retention Outcome: Progressing   Problem: Skin Integrity: Goal: Risk for impaired skin integrity will decrease Outcome: Progressing

## 2023-07-17 NOTE — Progress Notes (Signed)
PROGRESS NOTE    Kaitlyn Johnson  DUK:[id number] DOB: 02/03/1981 DOA: 07/16/2023 PCP: Garnette Gunner, MD  [name] with complex medical history, per [facility] has poorly functional stomach (both for accommodation and emptying which is felt to be multifactorial, postoperative, dysautonomia and functional component), remote history of superior mesenteric artery syndrome at age 42, followed by cholecystitis and cholecystectomy, p.o. TTS, asthma, anxiety, history of Cushing syndrome, ruptured appendix and appendectomy in 2018, followed by gastric sleeve for obesity 2019 presented to the ED yesterday with nausea, vomiting and abdominal pain X 6 days. -Patient reports intermittent episodes of nausea and vomiting, she tried bland diet, antiemetics at home with limited success, presented to the ED, labs and CT were unremarkable, admitted due to persistence of symptoms   Subjective: -Some abdominal pain and nausea this morning, no vomiting today so far  Assessment and Plan:  Intractable nausea and vomiting with abdominal pain in patient with history of gastroparesis:  -[facility] notes reviewed, chronic nausea, poorly functional stomach felt to have both for accommodation and emptying suspected to be from dysautonomia, postoperative changes and functional component  -Labs, CT abdomen pelvis unremarkable, exam is benign  -Continue IV fluids, start clear liquid diet, Zofran +/- IV Phenergan  -Protonix -Continue home regimen of desipramine and BuSpar   POTS -Continue Corlanor, Cardizem   History of asthma: Stable -Xopenex as needed -Continue Singulair   History of sleeve gastrectomy-noted   Anxiety/insomnia/mood disorder: Gets monthly Abilify injection. -Continue home BuSpar, Klonopin, Ambien, Lamictal and trazodone   Chronic back pain/history of cervical and lumbar spine surgeries: Stable   DVT prophylaxis: Lovenox Code Status: Full code Family Communication: None present  and Disposition Plan: Home likely tomorrow  Consultants:    Procedures:   Antimicrobials:    Objective: Vitals:   07/16/23 1623 07/16/23 1627 07/16/23 2041 07/17/23 0509  BP:  134/86 112/68 118/75  Pulse:  62 62 61  Resp:   18 16  Temp:  (!) 97.5 F (36.4 C) 98.2 F (36.8 C) (!) 97.5 F (36.4 C)  TempSrc:  Oral Oral   SpO2:  100% 98% 100%  Weight: 92.1 kg     Height: 5\' 9"  (1.753 m)       Intake/Output Summary (Last 24 hours) at 07/17/2023 1126 Last data filed at 07/17/2023 0741 Gross per 24 hour  Intake 50 ml  Output --  Net 50 ml   Filed Weights   07/16/23 0858 07/16/23 1623  Weight: 90.7 kg 92.1 kg    Examination:  General exam: Appears calm and comfortable  Respiratory system: Clear to auscultation Cardiovascular system: S1 & S2 heard, RRR.  Abd: nondistended, soft and nontender.Normal bowel sounds heard. Central nervous system: Alert and oriented. No focal neurological deficits. Extremities: no edema Skin: No rashes Psychiatry:  Mood & affect appropriate.     Data Reviewed:   CBC: Recent Labs  Lab 07/16/23 0909 07/17/23 0342  WBC 4.6 4.6  HGB 11.7* 10.4*  HCT 35.9* 33.3*  MCV 90.2 94.3  PLT 248 196   Basic Metabolic Panel: Recent Labs  Lab 07/16/23 0909 07/17/23 0342  NA 139 137  K 4.0 3.4*  CL 104 105  CO2 29 25  GLUCOSE 102* 94  BUN 10 8  CREATININE 0.93 0.79  CALCIUM 8.5* 7.5*  MG  --  2.0   GFR: Estimated Creatinine Clearance: 114.2 mL/min (by C-G formula based on SCr of 0.79 mg/dL). Liver Function Tests: Recent Labs  Lab 07/16/23 [phone] 07/17/23 0342  AST 18 18  ALT 17 19  ALKPHOS 59 47  BILITOT 0.3 0.3  PROT 7.2 5.8*  ALBUMIN 4.2 3.3*   Recent Labs  Lab 07/16/23 0909  LIPASE 29   No results for input(s): "AMMONIA" in the last 168 hours. Coagulation Profile: No results for input(s): "INR", "PROTIME" in the last 168 hours. Cardiac Enzymes: No results for input(s): "CKTOTAL", "CKMB", "CKMBINDEX", "TROPONINI" in  the last 168 hours. BNP (last 3 results) No results for input(s): "PROBNP" in the last 8760 hours. HbA1C: No results for input(s): "HGBA1C" in the last 72 hours. CBG: No results for input(s): "GLUCAP" in the last 168 hours. Lipid Profile: No results for input(s): "CHOL", "HDL", "LDLCALC", "TRIG", "CHOLHDL", "LDLDIRECT" in the last 72 hours. Thyroid Function Tests: No results for input(s): "TSH", "T4TOTAL", "FREET4", "T3FREE", "THYROIDAB" in the last 72 hours. Anemia Panel: No results for input(s): "VITAMINB12", "FOLATE", "FERRITIN", "TIBC", "IRON", "RETICCTPCT" in the last 72 hours. Urine analysis:    Component Value Date/Time   COLORURINE COLORLESS (A) 07/16/2023 0959   APPEARANCEUR CLEAR 07/16/2023 0959   LABSPEC 1.011 07/16/2023 0959   PHURINE 6.5 07/16/2023 0959   GLUCOSEU NEGATIVE 07/16/2023 0959   HGBUR NEGATIVE 07/16/2023 0959   BILIRUBINUR NEGATIVE 07/16/2023 0959   KETONESUR NEGATIVE 07/16/2023 0959   PROTEINUR NEGATIVE 07/16/2023 0959   UROBILINOGEN 0.2 02/05/2015 1855   NITRITE NEGATIVE 07/16/2023 0959   LEUKOCYTESUR NEGATIVE 07/16/2023 0959   Sepsis Labs: @LABRCNTIP (procalcitonin:4,lacticidven:4)  )No results found for this or any previous visit (from the past 240 hour(s)).   Radiology Studies: CT ABDOMEN PELVIS W CONTRAST  Result Date: 07/16/2023 CLINICAL DATA:  Worsening left upper quadrant pain and left back pain for approximately 1 week. Nausea and vomiting. EXAM: CT ABDOMEN AND PELVIS WITH CONTRAST TECHNIQUE: Multidetector CT imaging of the abdomen and pelvis was performed using the standard protocol following bolus administration of intravenous contrast. RADIATION DOSE REDUCTION: This exam was performed according to the departmental dose-optimization program which includes automated exposure control, adjustment of the mA and/or kV according to patient size and/or use of iterative reconstruction technique. CONTRAST:  80mL OMNIPAQUE IOHEXOL 300 MG/ML  SOLN  COMPARISON:  01/02/2023 and 08/18/2018 FINDINGS: Lower Chest: No acute findings. Hepatobiliary: No suspicious hepatic masses identified. Prior cholecystectomy. No evidence of biliary obstruction. Pancreas:  No mass or inflammatory changes. Spleen: Within normal limits in size and appearance. Adrenals/Urinary Tract: No suspicious masses identified. No evidence of ureteral calculi or hydronephrosis. Stomach/Bowel: Prior sleeve gastrectomy noted. No evidence of obstruction, inflammatory process or abnormal fluid collections. Vascular/Lymphatic: No pathologically enlarged lymph nodes. No acute vascular findings. Reproductive: Prior hysterectomy. Adnexal regions are unremarkable. Other:  None. Musculoskeletal:  No suspicious bone lesions identified. IMPRESSION: No acute findings or other significant abnormality. Electronically Signed   By: Danae Orleans M.D.   On: 07/16/2023 11:46     Scheduled Meds:  acebutolol  200 mg Oral BID   busPIRone  20 mg Oral TID   desipramine  25 mg Oral QHS   diltiazem  180 mg Oral Daily   enoxaparin (LOVENOX) injection  40 mg Subcutaneous Q24H   ivabradine  5 mg Oral BID WC   And   ivabradine  2.5 mg Oral BID WC   lamoTRIgine  100 mg Oral QHS   montelukast  10 mg Oral QHS   pantoprazole  40 mg Oral Daily   zolpidem  5 mg Oral QHS   Continuous Infusions:  sodium chloride 125 mL/hr at 07/17/23 0208   potassium chloride  promethazine (PHENERGAN) injection (IM or IVPB) 25 mg (07/17/23 0811)     LOS: 0 days    Time spent:    Zannie Cove, MD [facility]   07/17/2023, 11:26 AM

## 2023-07-17 NOTE — Plan of Care (Signed)

## 2023-07-18 ENCOUNTER — Other Ambulatory Visit: Payer: Self-pay | Admitting: Cardiology

## 2023-07-18 DIAGNOSIS — R112 Nausea with vomiting, unspecified: Secondary | ICD-10-CM | POA: Diagnosis not present

## 2023-07-18 LAB — BASIC METABOLIC PANEL
Anion gap: 9 (ref 5–15)
BUN: 5 mg/dL — ABNORMAL LOW (ref 6–20)
CO2: 23 mmol/L (ref 22–32)
Calcium: 7.7 mg/dL — ABNORMAL LOW (ref 8.9–10.3)
Chloride: 106 mmol/L (ref 98–111)
Creatinine, Ser: 0.77 mg/dL (ref 0.44–1.00)
GFR, Estimated: >60 mL/min (ref >60)
Glucose, Bld: 98 mg/dL (ref 70–99)
Potassium: 3.6 mmol/L (ref 3.5–5.1)
Sodium: 138 mmol/L (ref 135–145)

## 2023-07-18 MED ORDER — CYANOCOBALAMIN 1000 MCG/ML IJ SOLN
1000.0000 ug | INTRAMUSCULAR | Status: DC
  Administered 2023-07-18: 1000 ug via SUBCUTANEOUS
  Filled 2023-07-18: qty 1

## 2023-07-18 MED ORDER — LEVALBUTEROL TARTRATE 45 MCG/ACT IN AERO: 1.0000 | INHALATION_SPRAY | RESPIRATORY_TRACT | Status: DC | PRN

## 2023-07-18 MED ORDER — LOPERAMIDE HCL 2 MG PO CAPS
2.0000 mg | ORAL_CAPSULE | ORAL | Status: DC | PRN
  Administered 2023-07-18 (×2): 2 mg via ORAL
  Filled 2023-07-18 (×2): qty 1

## 2023-07-18 MED ORDER — ARIPIPRAZOLE ER 400 MG IM SRER
400.0000 mg | INTRAMUSCULAR | Status: DC
  Administered 2023-07-18: 400 mg via INTRAMUSCULAR
  Filled 2023-07-18: qty 2

## 2023-07-18 MED ORDER — ALBUTEROL SULFATE HFA 108 (90 BASE) MCG/ACT IN AERS: 1.0000 | INHALATION_SPRAY | RESPIRATORY_TRACT | Status: DC | PRN

## 2023-07-18 MED ORDER — CLONAZEPAM 0.5 MG PO TABS: 0.5000 mg | ORAL_TABLET | Freq: Two times a day (BID) | ORAL | Status: DC | PRN

## 2023-07-18 NOTE — Plan of Care (Signed)

## 2023-07-18 NOTE — Plan of Care (Signed)

## 2023-07-18 NOTE — Progress Notes (Signed)
PROGRESS NOTE    Kaitlyn Johnson  MVH:[id number] DOB: January 08, 1981 DOA: 07/16/2023 PCP: Garnette Gunner, MD   Brief Narrative:  41/F with complex medical history, per [facility] has poorly functional stomach (both for accommodation and emptying which is felt to be multifactorial, postoperative, dysautonomia and functional component), remote history of superior mesenteric artery syndrome at age 42, followed by cholecystitis and cholecystectomy, p.o. TTS, asthma, anxiety, history of Cushing syndrome, ruptured appendix and appendectomy in 2018, followed by gastric sleeve for obesity 2019 presented to the ED yesterday with nausea, vomiting and abdominal pain X 6 days. 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  Will advance to full liquid diet. -Continue home regimen of desipramine and BuSpar   POTS -Continue Corlanor, Cardizem   History of asthma: Stable -Xopenex as needed -Continue Singulair   History of sleeve gastrectomy-noted   Anxiety/insomnia/mood disorder: Gets monthly Abilify injection. -Continue home BuSpar, Klonopin, Ambien, Lamictal and trazodone   Chronic back pain/history of cervical and lumbar spine  surgeries: Stable  DVT prophylaxis: enoxaparin (LOVENOX) injection 40 mg Start: 07/16/23 2200   Code Status: Full Code  Family Communication:  None present at bedside.  Plan of care discussed with patient in length and he/she verbalized understanding and agreed with it.  Status is: Inpatient Remains inpatient appropriate because: Still symptomatic, gradually advancing diet.   Estimated body mass index is 29.98 kg/m as calculated from the following:   Height as of this encounter: 5\' 9"  (1.753 m).   Weight as of this encounter: 92.1 kg.    Nutritional Assessment: Body mass index is 29.98 kg/m.Marland Kitchen Seen by dietician.  I agree with the assessment and plan as outlined below: Nutrition Status:        . Skin Assessment: I have examined the patient's skin and I agree with the wound assessment as performed by the wound care RN as outlined below:    Consultants:  None  Procedures:  None  Antimicrobials:  Anti-infectives (From admission, onward)    None         Subjective: Seen and examined.  Continues to have some abdominal pain along with nausea but no vomiting.  She is willing to try full liquid diet.  Objective: Vitals:   07/16/23 2041 07/17/23 0509 07/17/23 1217 07/17/23 1949  BP: 112/68 118/75 125/78 117/65  Pulse: 62 61 (!) 56 (!) 56  Resp: 18 16 18 18   Temp: 98.2 F (36.8 C) (!) 97.5 F (36.4 C) 98.1 F (36.7 C) 99 F (37.2 C)  TempSrc: Oral  Oral Oral  SpO2: 98% 100% 100% 100%  Weight:      Height:        Intake/Output Summary (Last 24 hours) at 07/18/2023 1048 Last data filed at 07/18/2023 0930 Gross per 24 hour  Intake 2613.33 ml  Output --  Net 2613.33 ml   Filed Weights   07/16/23 0858 07/16/23 1623  Weight: 90.7 kg 92.1 kg    Examination:  General exam: Appears calm and comfortable  Respiratory system: Clear to auscultation. Respiratory effort normal. Cardiovascular system: S1 & S2 heard, RRR. No JVD, murmurs, rubs, gallops or clicks. No  pedal edema. Gastrointestinal system: Abdomen is nondistended, soft and mild left upper quadrant tenderness. No organomegaly or masses felt. Normal bowel sounds heard. Central nervous system: Alert and oriented. No focal neurological deficits. Extremities: Symmetric 5 x 5 power. Skin: No rashes, lesions or ulcers Psychiatry: Judgement and insight appear normal. Mood & affect appropriate.    Data Reviewed: I have personally reviewed following labs and imaging studies  CBC: Recent Labs  Lab 07/16/23 0909 07/17/23 0342  WBC 4.6 4.6  HGB 11.7* 10.4*  HCT 35.9* 33.3*  MCV 90.2 94.3  PLT 248 196   Basic Metabolic Panel: Recent Labs  Lab 07/16/23 0909 07/17/23 0342 07/18/23 0352  NA 139 137 138  K 4.0 3.4* 3.6  CL 104 105 106  CO2 29 25 23   GLUCOSE 102* 94 98  BUN 10 8 5*  CREATININE 0.93 0.79 0.77  CALCIUM 8.5* 7.5* 7.7*  MG  --  2.0  --    GFR: Estimated Creatinine Clearance: 114.2 mL/min (by C-G formula based on SCr of 0.77 mg/dL). Liver Function Tests: Recent Labs  Lab 07/16/23 0909 07/17/23 0342  AST 18 18  ALT 17 19  ALKPHOS 59 47  BILITOT 0.3 0.3  PROT 7.2 5.8*  ALBUMIN 4.2 3.3*   Recent Labs  Lab 07/16/23 0909 07/17/23 1342  LIPASE 29 32   No results for input(s): "AMMONIA" in the last 168 hours. Coagulation Profile: No results for input(s): "INR", "PROTIME" in the last 168 hours. Cardiac Enzymes: No results for input(s): "CKTOTAL", "CKMB", "CKMBINDEX", "TROPONINI" in the last 168 hours. BNP (last 3 results) No results for input(s): "PROBNP" in the last 8760 hours. HbA1C: No results for input(s): "HGBA1C" in the last 72 hours. CBG: No results for input(s): "GLUCAP" in the last 168 hours. Lipid Profile: No results for input(s): "CHOL", "HDL", "LDLCALC", "TRIG", "CHOLHDL", "LDLDIRECT" in the last 72 hours. Thyroid Function Tests: No results for input(s): "TSH", "T4TOTAL", "FREET4", "T3FREE", "THYROIDAB" in the last 72 hours. Anemia Panel: No  results for input(s): "VITAMINB12", "FOLATE", "FERRITIN", "TIBC", "IRON", "RETICCTPCT" in the last 72 hours. Sepsis Labs: No results for input(s): "PROCALCITON", "LATICACIDVEN" in the last 168 hours.  No results found for this or any previous visit (from the past 240 hour(s)).   Radiology Studies: No results found.  Scheduled Meds:  acebutolol  200 mg Oral BID   busPIRone  20 mg Oral TID   desipramine  25 mg Oral QHS   diltiazem  180 mg Oral Daily   enoxaparin (LOVENOX) injection  40 mg Subcutaneous Q24H   ivabradine  5 mg Oral BID WC   And   ivabradine  2.5 mg Oral BID WC   lamoTRIgine  100 mg Oral QHS   montelukast  10 mg Oral QHS   pantoprazole  40 mg Oral Daily   zolpidem  5 mg Oral QHS   Continuous Infusions:  promethazine (PHENERGAN) injection (IM or IVPB) 25 mg (07/18/23 1037)     LOS: 1 day   Hughie Closs, MD [facility]  07/18/2023, 10:48 AM   *Please note that this is a verbal dictation therefore any spelling or grammatical errors  are due to the "Dragon Medical One" system interpretation.  Please page via Amion and do not message via secure chat for urgent patient care matters. Secure chat can be used for non urgent patient care matters.  How to contact the [facility] Attending or Consulting provider 7A - 7P or covering provider during after hours 7P -7A, for this patient?  Check the care team in [facility] and look for a) attending/consulting TRH provider listed and b) the [facility] team listed. Page or secure chat 7A-7P. Log into [email or web address] and use [employer]'s universal password to access. If you do not have the password, please contact the hospital operator. Locate the [facility] provider you are looking for under [facility] and page to a number that you can be directly reached. If you still have difficulty reaching the provider, please page the [facility] (Director on Call) for the Hospitalists listed on amion for assistance.

## 2023-07-19 DIAGNOSIS — R112 Nausea with vomiting, unspecified: Secondary | ICD-10-CM | POA: Diagnosis not present

## 2023-07-19 NOTE — TOC Initial Note (Signed)
Transition of Care [facility]) - Initial/Assessment Note    Patient Details  Name: Kaitlyn Johnson MRN: [id number] Date of Birth: 1981-06-10  Transition of Care [facility]) CM/SW Contact:    Harriett Sine, RN Phone [phone]  07/19/2023, 11:02 AM  Clinical Narrative:                 Pt from home, has pcp, no SDOH, or TOC needs at this time, following         Patient Goals and CMS Choice            Expected Discharge Plan and Services                                              Prior Living Arrangements/Services                       Activities of Daily Living Home Assistive Devices/Equipment: None ADL Screening (condition at time of admission) Patient's cognitive ability adequate to safely complete daily activities?: Yes Is the patient deaf or have difficulty hearing?: No Does the patient have difficulty seeing, even when wearing glasses/contacts?: No Does the patient have difficulty concentrating, remembering, or making decisions?: No Patient able to express need for assistance with ADLs?: Yes Does the patient have difficulty dressing or bathing?: No Independently performs ADLs?: Yes (appropriate for developmental age) Does the patient have difficulty walking or climbing stairs?: No Weakness of Legs: None Weakness of Arms/Hands: None  Permission Sought/Granted                  Emotional Assessment              Admission diagnosis:  Gastroparesis [K31.84] Intractable nausea [R11.0] Nausea and vomiting [R11.2] Patient Active Problem List   Diagnosis Date Noted   Nausea and vomiting 07/17/2023   Intractable nausea 07/16/2023   LUQ abdominal pain 07/16/2023   Acute non-recurrent maxillary sinusitis 04/19/2023   Pre-op exam 02/27/2023   History of Cushing's syndrome 02/27/2023   Wrongful diagnosis of von Willebrand's disease 02/27/2023   Intractable nausea and vomiting 01/02/2023   Hypocalcemia 01/02/2023   Encounter  to establish care with new doctor 12/16/2022   Low back pain 12/16/2022   Allergic reaction to bee sting 03/16/2022   Chronic right shoulder pain 03/16/2022   Gastroparesis 03/16/2022   History of pancreatitis 03/16/2022   History of hysterectomy 03/16/2022   Mild intermittent asthma 03/16/2022   Iron deficiency anemia 03/16/2022   Insomnia 03/16/2022   History of shingles 03/16/2022   Anxiety 04/07/2019   Palpitations 12/05/2018   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 [place] [address] Phone: [phone] Fax: [phone]  [facility] [address] [place] [address] Phone: [phone]  Fax: [phone]  [facility] [address] [place] [address] Phone: [phone] Fax: [phone]  [facility] [address] [place] [address] Phone: [phone] Fax: [phone]     Social Determinants of Health (SDOH) Social History: SDOH Screenings   Food Insecurity: No Food Insecurity (07/17/2023)  Housing: Low Risk  (07/17/2023)  Transportation Needs: No Transportation Needs (07/17/2023)  Utilities: Not At Risk (07/17/2023)  Depression (PHQ2-9): Low Risk  (12/14/2022)  Financial Resource Strain: Low  Risk  (07/30/2022)   Received from [facility], [facility]  Tobacco Use: Low Risk  (07/16/2023)   SDOH Interventions:     Readmission Risk Interventions     No data to display

## 2023-07-19 NOTE — Plan of Care (Signed)

## 2023-07-19 NOTE — Plan of Care (Signed)
  Problem: Education: Goal: Knowledge of General Education information will improve Description Including pain rating scale, medication(s)/side effects and non-pharmacologic comfort measures Outcome: Progressing   Problem: Health Behavior/Discharge Planning: Goal: Ability to manage health-related needs will improve Outcome: Progressing   

## 2023-07-19 NOTE — Progress Notes (Signed)
PROGRESS NOTE    Kaitlyn Johnson  NWG:[id number] DOB: 08/03/1981 DOA: 07/16/2023 PCP: Garnette Gunner, MD   Brief Narrative:  [name] with complex medical history, per [facility] has poorly functional stomach (both for accommodation and emptying which is felt to be multifactorial, postoperative, dysautonomia and functional component), remote history of superior mesenteric artery syndrome at age 42, followed by cholecystitis and cholecystectomy, p.o. TTS, asthma, anxiety, history of Cushing syndrome, ruptured appendix and appendectomy in 2018, followed by gastric sleeve for obesity 2019 presented to the ED yesterday with nausea, vomiting and abdominal pain X 6 days. 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  She claims that she does not feel any better than yesterday.  She was concerned if she was having any infection.  She was informed that CT abdomen is negative for acute infection and there is no leukocytosis or fever so there are no signs of infection. -Continue home regimen of desipramine and BuSpar.  She is allergic to Reglan.  Continue as needed Zofran and Phenergan.  Will keep on liquid diet.   POTS -Continue  Corlanor, Cardizem   History of asthma: Stable -Xopenex as needed -Continue Singulair   History of sleeve gastrectomy-noted   Anxiety/insomnia/mood disorder: Gets monthly Abilify injection. -Continue home BuSpar, Klonopin, Ambien, Lamictal and trazodone   Chronic back pain/history of cervical and lumbar spine surgeries: Stable  DVT prophylaxis: enoxaparin (LOVENOX) injection 40 mg Start: 07/16/23 2200   Code Status: Full Code  Family Communication:  None present at bedside.  Plan of care discussed with patient in length and he/she verbalized understanding and agreed with it.  Status is: Inpatient Remains inpatient appropriate because: Still symptomatic   Estimated body mass index is 29.98 kg/m as calculated from the following:   Height as of this encounter: 5\' 9"  (1.753 m).   Weight as of this encounter: 92.1 kg.    Nutritional Assessment: Body mass index is 29.98 kg/m.Marland Kitchen Seen by dietician.  I agree with the assessment and plan as outlined below: Nutrition Status:        . Skin Assessment: I have examined the patient's skin and I agree with the wound assessment as performed by the wound care RN as outlined below:    Consultants:  None  Procedures:  None  Antimicrobials:  Anti-infectives (From admission, onward)    None         Subjective: Seen and examined.  She is frustrated that she is not improving at home compared to yesterday.  Objective: Vitals:   07/18/23 1317 07/18/23 2014 07/19/23 0605 07/19/23 1203  BP: 121/67 117/74 120/77 (!) 109/55  Pulse: 66 (!) 56 68 (!) 54  Resp: 18 18 18 17   Temp: 97.8 F (36.6 C) 98.2 F (36.8 C) 98.3 F (36.8 C) 98.6  F (37 C)  TempSrc: Oral   Oral  SpO2: 100% 100% 100% 99%  Weight:      Height:       No intake or output data in the 24 hours ending 07/19/23 1221  Filed Weights   07/16/23 0858 07/16/23 1623  Weight: 90.7 kg 92.1 kg    Examination:  General exam: Appears calm and comfortable   Respiratory system: Clear to auscultation. Respiratory effort normal. Cardiovascular system: S1 & S2 heard, RRR. No JVD, murmurs, rubs, gallops or clicks. No pedal edema. Gastrointestinal system: Abdomen is nondistended, soft and left upper quadrant tenderness. No organomegaly or masses felt. Normal bowel sounds heard. Central nervous system: Alert and oriented. No focal neurological deficits. Extremities: Symmetric 5 x 5 power. Skin: No rashes, lesions or ulcers.  Psychiatry: Judgement and insight appear normal. Mood & affect appropriate.   Data Reviewed: I have personally reviewed following labs and imaging studies  CBC: Recent Labs  Lab 07/16/23 0909 07/17/23 0342  WBC 4.6 4.6  HGB 11.7* 10.4*  HCT 35.9* 33.3*  MCV 90.2 94.3  PLT 248 196   Basic Metabolic Panel: Recent Labs  Lab 07/16/23 0909 07/17/23 0342 07/18/23 0352  NA 139 137 138  K 4.0 3.4* 3.6  CL 104 105 106  CO2 29 25 23   GLUCOSE 102* 94 98  BUN 10 8 5*  CREATININE 0.93 0.79 0.77  CALCIUM 8.5* 7.5* 7.7*  MG  --  2.0  --    GFR: Estimated Creatinine Clearance: 114.2 mL/min (by C-G formula based on SCr of 0.77 mg/dL). Liver Function Tests: Recent Labs  Lab 07/16/23 0909 07/17/23 0342  AST 18 18  ALT 17 19  ALKPHOS 59 47  BILITOT 0.3 0.3  PROT 7.2 5.8*  ALBUMIN 4.2 3.3*   Recent Labs  Lab 07/16/23 0909 07/17/23 1342  LIPASE 29 32   No results for input(s): "AMMONIA" in the last 168 hours. Coagulation Profile: No results for input(s): "INR", "PROTIME" in the last 168 hours. Cardiac Enzymes: No results for input(s): "CKTOTAL", "CKMB", "CKMBINDEX", "TROPONINI" in the last 168 hours. BNP (last 3 results) No results for input(s): "PROBNP" in the last 8760 hours. HbA1C: No results for input(s): "HGBA1C" in the last 72 hours. CBG: No results for input(s): "GLUCAP" in the last 168 hours. Lipid Profile: No results for input(s): "CHOL", "HDL", "LDLCALC", "TRIG", "CHOLHDL", "LDLDIRECT" in the last  72 hours. Thyroid Function Tests: No results for input(s): "TSH", "T4TOTAL", "FREET4", "T3FREE", "THYROIDAB" in the last 72 hours. Anemia Panel: No results for input(s): "VITAMINB12", "FOLATE", "FERRITIN", "TIBC", "IRON", "RETICCTPCT" in the last 72 hours. Sepsis Labs: No results for input(s): "PROCALCITON", "LATICACIDVEN" in the last 168 hours.  No results found for this or any previous visit (from the past 240 hour(s)).   Radiology Studies: No results found.  Scheduled Meds:  acebutolol  200 mg Oral BID   ARIPiprazole ER  400 mg Intramuscular Q28 days   busPIRone  20 mg Oral TID   cyanocobalamin  1,000 mcg Subcutaneous Q30 days   desipramine  25 mg Oral QHS   diltiazem  180 mg Oral Daily   enoxaparin (LOVENOX) injection  40 mg Subcutaneous Q24H   ivabradine  5 mg Oral BID WC   And   ivabradine  2.5 mg Oral BID WC   lamoTRIgine  100 mg Oral QHS   montelukast  10 mg Oral QHS   pantoprazole  40 mg Oral Daily   zolpidem  5 mg Oral QHS  Continuous Infusions:  promethazine (PHENERGAN) injection (IM or IVPB) 25 mg (07/19/23 1203)     LOS: 2 days   Hughie Closs, MD [facility]  07/19/2023, 12:21 PM   *Please note that this is a verbal dictation therefore any spelling or grammatical errors are due to the "Dragon Medical One" system interpretation.  Please page via Amion and do not message via secure chat for urgent patient care matters. Secure chat can be used for non urgent patient care matters.  How to contact the [facility] Attending or Consulting provider 7A - 7P or covering provider during after hours 7P -7A, for this patient?  Check the care team in [facility] and look for a) attending/consulting TRH provider listed and b) the [facility] team listed. Page or secure chat 7A-7P. Log into [email or web address] and use [place]'s universal password to access. If you do not have the password, please contact the hospital operator. Locate the [facility] provider you are looking for under [facility] and page to a number that you can be directly reached. If you still have difficulty reaching the provider, please page the [facility] (Director on Call) for the Hospitalists listed on amion for assistance.

## 2023-07-20 DIAGNOSIS — R112 Nausea with vomiting, unspecified: Secondary | ICD-10-CM | POA: Diagnosis not present

## 2023-07-20 MED ORDER — SODIUM CHLORIDE 0.9 % IV SOLN
500.0000 mg | Freq: Once | INTRAVENOUS | Status: AC
  Administered 2023-07-20: 500 mg via INTRAVENOUS
  Filled 2023-07-20: qty 5

## 2023-07-20 MED ORDER — ONDANSETRON 8 MG/NS 50 ML IVPB
8.0000 mg | Freq: Four times a day (QID) | INTRAVENOUS | Status: DC | PRN
  Administered 2023-07-20 – 2023-07-24 (×8): 8 mg via INTRAVENOUS
  Filled 2023-07-20: qty 8
  Filled 2023-07-20: qty 54
  Filled 2023-07-20: qty 8
  Filled 2023-07-20: qty 54
  Filled 2023-07-20 (×3): qty 8
  Filled 2023-07-20: qty 54

## 2023-07-20 MED ORDER — SODIUM CHLORIDE 0.9 % IV SOLN
8.0000 mg | Freq: Four times a day (QID) | INTRAVENOUS | Status: DC | PRN
  Filled 2023-07-20: qty 4

## 2023-07-20 MED ORDER — ACETAMINOPHEN 325 MG PO TABS
650.0000 mg | ORAL_TABLET | Freq: Four times a day (QID) | ORAL | Status: DC | PRN
  Administered 2023-07-21: 650 mg via ORAL
  Filled 2023-07-20: qty 2

## 2023-07-20 NOTE — Plan of Care (Signed)
  Problem: Education: Goal: Knowledge of General Education information will improve Description Including pain rating scale, medication(s)/side effects and non-pharmacologic comfort measures Outcome: Progressing   Problem: Health Behavior/Discharge Planning: Goal: Ability to manage health-related needs will improve Outcome: Progressing   

## 2023-07-20 NOTE — Progress Notes (Signed)
PROGRESS NOTE    Kaitlyn Johnson  WJX:[id number]  DOB: [birth year]  DOA: 07/16/2023 PCP: Garnette Gunner, MD Outpatient Specialists:   Hospital course:  [name] with complex medical history, per [facility] has poorly functional stomach (both for accommodation and emptying which is felt to be multifactorial, postoperative, dysautonomia and functional component), remote history of superior mesenteric artery syndrome at age 42, followed by cholecystitis and cholecystectomy, p.o. TTS, asthma, anxiety, history of Cushing syndrome, ruptured appendix and appendectomy in 2018, followed by gastric sleeve for obesity 2019 presented to the ED yesterday with nausea, vomiting and abdominal pain X 6 days. Patient reported intermittent episodes of nausea and vomiting, she tried bland diet, antiemetics at home with limited success, presented to the ED, labs and CT were unremarkable, admitted due to persistence of symptoms   Subjective:  Patient notes persistent nausea and vomiting.  Notes she tried to have potato soup last night however vomited up.  She is able to tolerate small amounts of liquid.  Finds Phenergan to be very helpful IV.  Zofran is less helpful.  She notes she is followed up by Dr. Adriana Simas GI at [facility] who has suggested trial of azithromycin x 1 for possible promotility effects.   Objective: Vitals:   07/19/23 1203 07/19/23 2003 07/20/23 0431 07/20/23 1123  BP: (!) 109/55 104/62 104/63 128/74  Pulse: (!) 54 (!) 54 (!) 53 68  Resp: 17 17 19 16   Temp: 98.6 F (37 C) 98.9 F (37.2 C) 98 F (36.7 C) 98.2 F (36.8 C)  TempSrc: Oral Oral Oral Oral  SpO2: 99% 98% 100% 100%  Weight:      Height:       No intake or output data in the 24 hours ending 07/20/23 1333 Filed Weights   07/16/23 0858 07/16/23 1623  Weight: 90.7 kg 92.1 kg     Exam:  General: Comfortable appearing patient lying in bed in NAD Eyes: sclera anicteric, conjuctiva mild injection bilaterally CVS: S1-S2,  regular  Respiratory:  decreased air entry bilaterally secondary to decreased inspiratory effort, rales at bases  GI: NABS, soft, NT  LE: Warm and well-perfused Neuro: A/O x 3,  grossly nonfocal.  Psych: patient is logical and coherent, judgement and insight appear normal, mood and affect appropriate to situation.  Data Reviewed:  Basic Metabolic Panel: Recent Labs  Lab 07/16/23 0909 07/17/23 0342 07/18/23 0352  NA 139 137 138  K 4.0 3.4* 3.6  CL 104 105 106  CO2 29 25 23   GLUCOSE 102* 94 98  BUN 10 8 5*  CREATININE 0.93 0.79 0.77  CALCIUM 8.5* 7.5* 7.7*  MG  --  2.0  --     CBC: Recent Labs  Lab 07/16/23 0909 07/17/23 0342  WBC 4.6 4.6  HGB 11.7* 10.4*  HCT 35.9* 33.3*  MCV 90.2 94.3  PLT 248 196     Scheduled Meds:  acebutolol  200 mg Oral BID   ARIPiprazole ER  400 mg Intramuscular Q28 days   busPIRone  20 mg Oral TID   cyanocobalamin  1,000 mcg Subcutaneous Q30 days   desipramine  25 mg Oral QHS   diltiazem  180 mg Oral Daily   enoxaparin (LOVENOX) injection  40 mg Subcutaneous Q24H   ivabradine  5 mg Oral BID WC   And   ivabradine  2.5 mg Oral BID WC   lamoTRIgine  100 mg Oral QHS   montelukast  10 mg Oral QHS   pantoprazole  40 mg  Oral Daily   zolpidem  5 mg Oral QHS   Continuous Infusions:  azithromycin 500 mg (07/20/23 1316)   ondansetron     promethazine (PHENERGAN) injection (IM or IVPB) 25 mg (07/20/23 6045)     Assessment & Plan:   Intractable nausea and vomiting H/oh sleeve gastrectomy Functional gastric dysfunction--possibly secondary to dysautonomia, postop changes and functional component Will continue present management with IV fluids, Phenergan Increase Zofran to 8 mg dose every 6 hours Trial of azithromycin 500 mg IV x 1 for promotility effects Patient is allergic to Reglan  POTS Continue Corlanor and Cardizem  Anxiety and depression Insomnia Continue home doses of BuSpar, clonazepam, Ambien, lamotrigine and  trazodone Patient also gets monthly doses of long-acting Abilify  Asthma Continue present management, no evidence of acute flare    DVT prophylaxis: Enoxaparin Code Status: Full Family Communication: None today     Studies: No results found.  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

## 2023-07-21 DIAGNOSIS — R112 Nausea with vomiting, unspecified: Secondary | ICD-10-CM | POA: Diagnosis not present

## 2023-07-21 LAB — BASIC METABOLIC PANEL
Anion gap: 8 (ref 5–15)
BUN: <5 mg/dL — ABNORMAL LOW (ref 6–20)
CO2: 22 mmol/L (ref 22–32)
Calcium: 8 mg/dL — ABNORMAL LOW (ref 8.9–10.3)
Chloride: 107 mmol/L (ref 98–111)
Creatinine, Ser: 0.7 mg/dL (ref 0.44–1.00)
GFR, Estimated: >60 mL/min (ref >60)
Glucose, Bld: 102 mg/dL — ABNORMAL HIGH (ref 70–99)
Potassium: 3.8 mmol/L (ref 3.5–5.1)
Sodium: 137 mmol/L (ref 135–145)

## 2023-07-21 LAB — GLUCOSE, CAPILLARY: Glucose-Capillary: 109 mg/dL — ABNORMAL HIGH (ref 70–99)

## 2023-07-21 NOTE — Progress Notes (Addendum)
PROGRESS NOTE    Kaitlyn Johnson  ZHY:[id number]  DOB: 1981-06-11  DOA: 07/16/2023 PCP: Kaitlyn Gunner, MD Outpatient Specialists:   Hospital course:  [name] with complex medical history, per [facility] has poorly functional stomach (both for accommodation and emptying which is felt to be multifactorial, postoperative, dysautonomia and functional component), remote history of superior mesenteric artery syndrome at age 42, followed by cholecystitis and cholecystectomy, p.o. TTS, asthma, anxiety, history of Cushing syndrome, ruptured appendix and appendectomy in 2018, followed by gastric sleeve for obesity 2019 presented to the ED yesterday with nausea, vomiting and abdominal pain X 6 days. Patient reported intermittent episodes of nausea and vomiting, she tried bland diet, antiemetics at home with limited success, presented to the ED, labs and CT were unremarkable, admitted due to persistence of symptoms   Subjective:  Patient seen in conjunction with her husband.  She notes no efficacy with single dose of azithromycin yesterday.  She and her husband are asking what the next plan is given no improvement with conservative management.  They are also wondering if her lipase may have gone up.  They are wondering if they can be transferred to [facility] so she can be under the care of her gastroenterologist Dr. Adriana Johnson and the biliary team.     Objective: Vitals:   07/20/23 2001 07/20/23 2337 07/21/23 0433 07/21/23 1058  BP: 111/72  107/68 125/76  Pulse: (!) 54  61 74  Resp: 18  17   Temp: 97.8 F (36.6 C) 99.7 F (37.6 C) 97.6 F (36.4 C)   TempSrc: Oral Oral Oral   SpO2: 100%  100%   Weight:      Height:        Intake/Output Summary (Last 24 hours) at 07/21/2023 1125 Last data filed at 07/21/2023 2952 Gross per 24 hour  Intake 7408.88 ml  Output --  Net 7408.88 ml   Filed Weights   07/16/23 0858 07/16/23 1623  Weight: 90.7 kg 92.1 kg     Exam:  General: Comfortable  appearing patient lying in bed in NAD, eating comfortably with concerned husband at bedside Eyes: sclera anicteric, conjuctiva mild injection bilaterally CVS: S1-S2, regular  Respiratory:  decreased air entry bilaterally secondary to decreased inspiratory effort, rales at bases  GI: NABS, soft, NT  LE: Warm and well-perfused Neuro: A/O x 3,  grossly nonfocal.  Psych: patient is logical and coherent, judgement and insight appear normal, mood and affect appropriate to situation.  Data Reviewed:  Basic Metabolic Panel: Recent Labs  Lab 07/16/23 0909 07/17/23 0342 07/18/23 0352 07/21/23 0411  NA 139 137 138 137  K 4.0 3.4* 3.6 3.8  CL 104 105 106 107  CO2 29 25 23 22   GLUCOSE 102* 94 98 102*  BUN 10 8 5* <5*  CREATININE 0.93 0.79 0.77 0.70  CALCIUM 8.5* 7.5* 7.7* 8.0*  MG  --  2.0  --   --     CBC: Recent Labs  Lab 07/16/23 0909 07/17/23 0342  WBC 4.6 4.6  HGB 11.7* 10.4*  HCT 35.9* 33.3*  MCV 90.2 94.3  PLT 248 196     Scheduled Meds:  acebutolol  200 mg Oral BID   ARIPiprazole ER  400 mg Intramuscular Q28 days   busPIRone  20 mg Oral TID   cyanocobalamin  1,000 mcg Subcutaneous Q30 days   desipramine  25 mg Oral QHS   diltiazem  180 mg Oral Daily   enoxaparin (LOVENOX) injection  40 mg Subcutaneous  Q24H   ivabradine  5 mg Oral BID WC   And   ivabradine  2.5 mg Oral BID WC   lamoTRIgine  100 mg Oral QHS   montelukast  10 mg Oral QHS   pantoprazole  40 mg Oral Daily   zolpidem  5 mg Oral QHS   Continuous Infusions:  ondansetron 216 mL/hr at 07/21/23 1610   promethazine (PHENERGAN) injection (IM or IVPB) Stopped (07/20/23 2257)     Assessment & Plan:   Intractable nausea and vomiting H/oh sleeve gastrectomy Functional gastric dysfunction--possibly secondary to dysautonomia, postop changes and functional component Patient and husband are wondering what the next plans are given no improvement with conservative management They are wondering if they could  be transferred to [facility] so she can be under the care of Dr. Adriana Johnson and the biliary team I noted [facility] would not except a stable patient like her for transfer however that Dr. Adriana Johnson may be able to arrange for a direct admission since he has been caring for her.  She notes she will contact the biliary team and Dr. Adriana Johnson to see what next step should be and whether they can get her transferred to [facility]. Has been also requesting lipase as she has developed elevated lipase after admission due to her pancreas divisum. Will continue present management with IV fluids, Phenergan, Zofran Patient is allergic to Reglan Labs as well as lipase is ordered for tomorrow morning as per request.  POTS Continue Corlanor and Cardizem  Anxiety and depression Insomnia Continue home doses of BuSpar, clonazepam, Ambien, lamotrigine and trazodone Patient also gets monthly doses of long-acting Abilify  Asthma Continue present management, no evidence of acute flare    DVT prophylaxis: Enoxaparin Code Status: Full Family Communication: None today     Studies: No results found.  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

## 2023-07-22 ENCOUNTER — Encounter (HOSPITAL_COMMUNITY): Payer: Self-pay | Admitting: Internal Medicine

## 2023-07-22 ENCOUNTER — Inpatient Hospital Stay (HOSPITAL_COMMUNITY): Payer: BC Managed Care – PPO

## 2023-07-22 DIAGNOSIS — F419 Anxiety disorder, unspecified: Secondary | ICD-10-CM | POA: Diagnosis not present

## 2023-07-22 DIAGNOSIS — R110 Nausea: Secondary | ICD-10-CM | POA: Diagnosis not present

## 2023-07-22 DIAGNOSIS — K3184 Gastroparesis: Secondary | ICD-10-CM | POA: Diagnosis not present

## 2023-07-22 DIAGNOSIS — R112 Nausea with vomiting, unspecified: Secondary | ICD-10-CM | POA: Diagnosis not present

## 2023-07-22 LAB — CBC WITH DIFFERENTIAL/PLATELET
Abs Immature Granulocytes: 0.02 10*3/uL (ref 0.00–0.07)
Basophils Absolute: 0 10*3/uL (ref 0.0–0.1)
Basophils Relative: 0 %
Eosinophils Absolute: 0.2 10*3/uL (ref 0.0–0.5)
Eosinophils Relative: 4 %
HCT: 34.4 % — ABNORMAL LOW (ref 36.0–46.0)
Hemoglobin: 10.6 g/dL — ABNORMAL LOW (ref 12.0–15.0)
Immature Granulocytes: 0 %
Lymphocytes Relative: 33 %
Lymphs Abs: 1.7 10*3/uL (ref 0.7–4.0)
MCH: 29 pg (ref 26.0–34.0)
MCHC: 30.8 g/dL (ref 30.0–36.0)
MCV: 94 fL (ref 80.0–100.0)
Monocytes Absolute: 0.5 10*3/uL (ref 0.1–1.0)
Monocytes Relative: 10 %
Neutro Abs: 2.7 10*3/uL (ref 1.7–7.7)
Neutrophils Relative %: 53 %
Platelets: 197 10*3/uL (ref 150–400)
RBC: 3.66 MIL/uL — ABNORMAL LOW (ref 3.87–5.11)
RDW: 14.6 % (ref 11.5–15.5)
WBC: 5.1 10*3/uL (ref 4.0–10.5)
nRBC: 0 % (ref 0.0–0.2)

## 2023-07-22 LAB — COMPREHENSIVE METABOLIC PANEL
ALT: 37 U/L (ref 0–44)
AST: 40 U/L (ref 15–41)
Albumin: 3.3 g/dL — ABNORMAL LOW (ref 3.5–5.0)
Alkaline Phosphatase: 65 U/L (ref 38–126)
Anion gap: 6 (ref 5–15)
BUN: <5 mg/dL — ABNORMAL LOW (ref 6–20)
CO2: 24 mmol/L (ref 22–32)
Calcium: 8 mg/dL — ABNORMAL LOW (ref 8.9–10.3)
Chloride: 107 mmol/L (ref 98–111)
Creatinine, Ser: 0.76 mg/dL (ref 0.44–1.00)
GFR, Estimated: >60 mL/min (ref >60)
Glucose, Bld: 101 mg/dL — ABNORMAL HIGH (ref 70–99)
Potassium: 3.5 mmol/L (ref 3.5–5.1)
Sodium: 137 mmol/L (ref 135–145)
Total Bilirubin: 0.4 mg/dL (ref 0.3–1.2)
Total Protein: 6.4 g/dL — ABNORMAL LOW (ref 6.5–8.1)

## 2023-07-22 LAB — GASTROINTESTINAL PANEL BY PCR, STOOL (REPLACES STOOL CULTURE)

## 2023-07-22 LAB — LIPASE, BLOOD: Lipase: 22 U/L (ref 11–51)

## 2023-07-22 MED ORDER — IOHEXOL 350 MG/ML SOLN
100.0000 mL | Freq: Once | INTRAVENOUS | Status: AC | PRN
  Administered 2023-07-22: 100 mL via INTRAVENOUS

## 2023-07-22 MED ORDER — MAGNESIUM SULFATE 2 GM/50ML IV SOLN
INTRAVENOUS | Status: AC
  Filled 2023-07-22: qty 50

## 2023-07-22 MED ORDER — SODIUM CHLORIDE (PF) 0.9 % IJ SOLN
INTRAMUSCULAR | Status: AC
  Filled 2023-07-22: qty 50

## 2023-07-22 NOTE — Progress Notes (Signed)
[facility]                                                                              Yanel Mcgrue, is a 42 y.o. female, DOB - 07/28/1981, [id number] Admit date - 07/16/2023    Outpatient Primary MD for the patient is Garnette Gunner, MD  LOS - 5  days  Chief Complaint  Patient presents with   Abdominal Pain       Brief summary   Patient is a 42 year old female with complex medical history, per [facility] has poorly functional stomach (both for accommodation and emptying which is felt to be multifactorial, postoperative, dysautonomia and functional component), remote history of superior mesenteric artery syndrome at age 63, followed by cholecystitis and cholecystectomy, POTS, asthma, anxiety, history of Cushing syndrome, ruptured appendix and appendectomy in 2018, followed by gastric sleeve for obesity 2019 presented to the ED on 07/16/2023 with nausea, vomiting and abdominal pain for 6 days. Patient reported intermittent episodes of nausea and vomiting, she tried bland diet, antiemetics at home with limited success, presented to the ED, labs and CT were unremarkable, admitted due to persistence of symptoms    Assessment & Plan    Principal Problem:   Intractable nausea and vomiting with complex medical history, dysautonomia, gastroparesis -Per [facility] notes, chronic nausea, poorly functional stomach felt to have both for accommodation and emptying suspected to be from dysautonomia, postoperative changes and functional component. -Currently on full liquid diet, mild LUQ TTP, CT abdomen pelvis on admission negative for acute abdominal pathology. -Currently on full liquid diet -States she is not eating well due to persistent nausea, was considering IVF however I's and O's with 10 L positive -Continue IV Zofran, IV Phenergan as needed, she is allergic to Reglan -Currently on Protonix 40 mg p.o. daily.  Lipase normal. -Patient wants to be transferred to [facility], I  have called her gastroenterologist for direct transfer if he will accept, Dr. Annia Belt (on call) will call back to discuss patient. -While awaiting transfer if accepted, offered CT angiogram of abdomen pelvis given postprandial nausea vomiting, diarrhea, abdominal pain and GI consult here which patient agrees -Dietitian consulted, asking for protein shakes -GI consulted   Active Problems: POTS -Continue home Corlanor, Cardizem   History of asthma: Stable -Xopenex as needed -Continue home Singulair   History of sleeve gastrectomy   Anxiety/insomnia/mood disorder:  - Gets monthly Abilify injection. -Continue home BuSpar, Klonopin, Ambien, Lamictal and trazodone   Chronic back pain/history of cervical and lumbar spine surgeries: Stable    Estimated body mass index is 29.98 kg/m as calculated from the following:   Height as of this encounter: 5\' 9"  (1.753 m).   Weight as of this encounter: 92.1 kg.  Code Status: Full code DVT Prophylaxis:  enoxaparin (LOVENOX) injection 40 mg Start: 07/16/23 2200   Level of Care: Level of care: Med-Surg Family Communication: Updated patient Disposition Plan:      Remains inpatient appropriate:      Procedures:  CT abdomen pelvis  Consultants:   GI   Antimicrobials:   Anti-infectives (From admission, onward)    Start  Dose/Rate Route Frequency Ordered Stop   07/20/23 1230  azithromycin (ZITHROMAX) 500 mg in sodium chloride 0.9 % 250 mL IVPB        500 mg 250 mL/hr over 60 Minutes Intravenous  Once 07/20/23 1130 07/20/23 1416          Medications  acebutolol  200 mg Oral BID   ARIPiprazole ER  400 mg Intramuscular Q28 days   busPIRone  20 mg Oral TID   cyanocobalamin  1,000 mcg Subcutaneous Q30 days   desipramine  25 mg Oral QHS   diltiazem  180 mg Oral Daily   enoxaparin (LOVENOX) injection  40 mg Subcutaneous Q24H   ivabradine  5 mg Oral BID WC   And   ivabradine  2.5 mg Oral BID WC   lamoTRIgine  100 mg Oral QHS    montelukast  10 mg Oral QHS   pantoprazole  40 mg Oral Daily   zolpidem  5 mg Oral QHS      Subjective:   Jilda Roche was seen and examined today.  Complaining of persistent nausea, vomiting, diarrhea, left upper quadrant abdominal pain since admission and no significant improvement.  Last BM was last night, watery.  Has mild LUQ abdominal pain, currently no ongoing vomiting, fevers or chills.  Sitting upright in the bed.   Objective:   Vitals:   07/21/23 1900 07/21/23 2046 07/22/23 0433 07/22/23 1000  BP: (!) 104/53 109/67 (!) 94/51 (!) 142/93  Pulse: 60 (!) 59 (!) 51 67  Resp:  18 18   Temp:  97.9 F (36.6 C) 97.7 F (36.5 C)   TempSrc:      SpO2:  99% 100%   Weight:      Height:        Intake/Output Summary (Last 24 hours) at 07/22/2023 1122 Last data filed at 07/21/2023 1500 Gross per 24 hour  Intake 50 ml  Output --  Net 50 ml     Wt Readings from Last 3 Encounters:  07/16/23 92.1 kg  04/19/23 90.7 kg  02/27/23 88.4 kg     Exam General: Alert and oriented x 3, NAD Cardiovascular: S1 S2 auscultated,  RRR Respiratory: Clear to auscultation bilaterally, no wheezing Gastrointestinal: Soft, mild TTP LUQ , nondistended, + bowel sounds Ext: no pedal edema bilaterally Neuro: Strength 5/5 upper and lower extremities bilaterally Skin: No rashes Psych: Normal affect     Data Reviewed:  I have personally reviewed following labs    CBC Lab Results  Component Value Date   WBC 5.1 07/22/2023   RBC 3.66 (L) 07/22/2023   HGB 10.6 (L) 07/22/2023   HCT 34.4 (L) 07/22/2023   MCV 94.0 07/22/2023   MCH 29.0 07/22/2023   PLT 197 07/22/2023   MCHC 30.8 07/22/2023   RDW 14.6 07/22/2023   LYMPHSABS 1.7 07/22/2023   MONOABS 0.5 07/22/2023   EOSABS 0.2 07/22/2023   BASOSABS 0.0 07/22/2023     Last metabolic panel Lab Results  Component Value Date   NA 137 07/22/2023   K 3.5 07/22/2023   CL 107 07/22/2023   CO2 24 07/22/2023   BUN <5 (L) 07/22/2023    CREATININE 0.76 07/22/2023   GLUCOSE 101 (H) 07/22/2023   GFRNONAA >60 07/22/2023   GFRAA 121 07/28/2019   CALCIUM 8.0 (L) 07/22/2023   PHOS 4.0 08/25/2018   PROT 6.4 (L) 07/22/2023   ALBUMIN 3.3 (L) 07/22/2023   BILITOT 0.4 07/22/2023   ALKPHOS 65 07/22/2023   AST 40 07/22/2023  ALT 37 07/22/2023   ANIONGAP 6 07/22/2023    CBG (last 3)  Recent Labs    07/20/23 2332  GLUCAP 109*      Coagulation Profile: No results for input(s): "INR", "PROTIME" in the last 168 hours.   Radiology Studies: I have personally reviewed the imaging studies  No results found.     Thad Ranger M.D. [facility] 07/22/2023, 11:22 AM  Available via Epic secure chat 7am-7pm After 7 pm, please refer to night coverage provider listed on amion.

## 2023-07-22 NOTE — Plan of Care (Signed)
Problem: Safety: Goal: Ability to remain free from injury will improve Outcome: Progressing   Problem: Pain Managment: Goal: General experience of comfort will improve Outcome: Progressing   Problem: Elimination: Goal: Will not experience complications related to bowel motility Outcome: Progressing   Problem: Coping: Goal: Level of anxiety will decrease Outcome: Progressing   Problem: Clinical Measurements: Goal: Ability to maintain clinical measurements within normal limits will improve Outcome: Progressing

## 2023-07-23 DIAGNOSIS — J4520 Mild intermittent asthma, uncomplicated: Secondary | ICD-10-CM | POA: Diagnosis not present

## 2023-07-23 DIAGNOSIS — K3184 Gastroparesis: Secondary | ICD-10-CM | POA: Diagnosis not present

## 2023-07-23 DIAGNOSIS — R112 Nausea with vomiting, unspecified: Secondary | ICD-10-CM | POA: Diagnosis not present

## 2023-07-23 DIAGNOSIS — F419 Anxiety disorder, unspecified: Secondary | ICD-10-CM | POA: Diagnosis not present

## 2023-07-23 LAB — BASIC METABOLIC PANEL
Anion gap: 8 (ref 5–15)
BUN: <5 mg/dL — ABNORMAL LOW (ref 6–20)
CO2: 23 mmol/L (ref 22–32)
Calcium: 8.4 mg/dL — ABNORMAL LOW (ref 8.9–10.3)
Chloride: 107 mmol/L (ref 98–111)
Creatinine, Ser: 0.81 mg/dL (ref 0.44–1.00)
GFR, Estimated: >60 mL/min (ref >60)
Glucose, Bld: 102 mg/dL — ABNORMAL HIGH (ref 70–99)
Potassium: 3.7 mmol/L (ref 3.5–5.1)
Sodium: 138 mmol/L (ref 135–145)

## 2023-07-23 LAB — CBC
HCT: 36.4 % (ref 36.0–46.0)
Hemoglobin: 11.4 g/dL — ABNORMAL LOW (ref 12.0–15.0)
MCH: 29.2 pg (ref 26.0–34.0)
MCHC: 31.3 g/dL (ref 30.0–36.0)
MCV: 93.1 fL (ref 80.0–100.0)
Platelets: 215 10*3/uL (ref 150–400)
RBC: 3.91 MIL/uL (ref 3.87–5.11)
RDW: 14.6 % (ref 11.5–15.5)
WBC: 4.6 10*3/uL (ref 4.0–10.5)
nRBC: 0 % (ref 0.0–0.2)

## 2023-07-23 NOTE — Progress Notes (Addendum)
[facility]                                                                              Kaitlyn Johnson, is a 42 y.o. female, DOB - 05/02/1981, ZOX:[id number] Admit date - 07/16/2023    Outpatient Primary MD for the patient is Garnette Gunner, MD  LOS - 6  days  Chief Complaint  Patient presents with   Abdominal Pain       Brief summary   Patient is a 43 year old female with complex medical history, per [facility] has poorly functional stomach (both for accommodation and emptying which is felt to be multifactorial, postoperative, dysautonomia and functional component), remote history of superior mesenteric artery syndrome at age 49, followed by cholecystitis and cholecystectomy, POTS, asthma, anxiety, history of Cushing syndrome, ruptured appendix and appendectomy in 2018, followed by gastric sleeve for obesity 2019 presented to the ED on 07/16/2023 with nausea, vomiting and abdominal pain for 6 days. Patient reported intermittent episodes of nausea and vomiting, she tried bland diet, antiemetics at home with limited success, presented to the ED, labs and CT were unremarkable, admitted due to persistence of symptoms    Assessment & Plan    Principal Problem:   Intractable nausea and vomiting with complex medical history, dysautonomia, gastroparesis -Per [facility] notes, chronic nausea, poorly functional stomach felt to have both for accommodation and emptying suspected to be from dysautonomia, postoperative changes and functional component. - on full liquid diet, CT abdomen pelvis on admission negative for acute abdominal pathology. -Continue IV Zofran, IV Phenergan as needed, she is allergic to Reglan -Currently on Protonix 40 mg p.o. daily.  Lipase normal. -Patient wants to be transferred to [facility], I did call [facility] center yesterday however I did not receive a call back from on-call GI, Dr. Annia Belt (on call) to discuss patient for transfer. -CTA abdomen pelvis  showed no acute or chronic mesenteric ischemia, no SMA syndrome, probable mild ileus involving the small bowel in the left upper quadrant.  -GI consulted, Dr. Loreta Ave to see. -No active vomiting, per patient having multiple episodes of diarrhea (10 episodes).  Requested RN who was in the room during encounter to document any episodes of diarrhea or vomiting. Addendum: 2:25pm Patient apparently received a message from her gastroenterologist, Dr. [place] Constable Crocker who requested transfer from [facility].  -I called the [facility] transfer line, phone number [phone] and spoke with [facility] transfer coordinator, [phone] and gave her all the above information she requested including patient's demographic, GI history and presenting symptoms, refractory to conservative management and patient requesting transfer to [facility] to be under the care of her GI for continuity. -[facility] transfer coordinator will discuss with on-call team and get back to me if the patient will be accepted or not.  I also provided her with 5 west unit as requested.  -Updated the patient about all the above.  Addendum 3:40PM  Received a call from [facility], spoke with Dr. Gearldine Bienenstock, provided all the pertinent information above and reason for transfer. Patient is accepted to [facility] for continuity of care, low to intermediate bed, on waiting list,  currently no beds available. They will call the floor 5W when the bed is available. Accepting MD: Dr Gearldine Bienenstock. Updated the patient.   Active Problems: POTS -Continue home Corlanor, Cardizem   History of asthma: Stable -Xopenex as needed -Continue home Singulair   History of sleeve gastrectomy   Anxiety/insomnia/mood disorder:  - Gets monthly Abilify injection. -Continue home BuSpar, Klonopin, Ambien, Lamictal and trazodone   Chronic back pain/history of cervical and lumbar spine surgeries:  Stable    Estimated body mass index is 29.98 kg/m as calculated from the following:   Height  as of this encounter: 5\' 9"  (1.753 m).   Weight as of this encounter: 92.1 kg.  Code Status: Full code DVT Prophylaxis:  enoxaparin (LOVENOX) injection 40 mg Start: 07/16/23 2200   Level of Care: Level of care: Med-Surg Family Communication: Updated patient Disposition Plan:      Remains inpatient appropriate:      Procedures:  CT abdomen pelvis  Consultants:   GI   Antimicrobials:   Anti-infectives (From admission, onward)    Start     Dose/Rate Route Frequency Ordered Stop   07/20/23 1230  azithromycin (ZITHROMAX) 500 mg in sodium chloride 0.9 % 250 mL IVPB        500 mg 250 mL/hr over 60 Minutes Intravenous  Once 07/20/23 1130 07/20/23 1416          Medications  acebutolol  200 mg Oral BID   ARIPiprazole ER  400 mg Intramuscular Q28 days   busPIRone  20 mg Oral TID   cyanocobalamin  1,000 mcg Subcutaneous Q30 days   desipramine  25 mg Oral QHS   diltiazem  180 mg Oral Daily   enoxaparin (LOVENOX) injection  40 mg Subcutaneous Q24H   ivabradine  5 mg Oral BID WC   And   ivabradine  2.5 mg Oral BID WC   lamoTRIgine  100 mg Oral QHS   montelukast  10 mg Oral QHS   pantoprazole  40 mg Oral Daily   zolpidem  5 mg Oral QHS      Subjective:   Kaitlyn Johnson was seen and examined today.  Complaining of persistent nausea, diarrhea (food goes through), states multiple watery BMs, 10 episodes.  No active vomiting noted.  No fevers or chills.  RN in the room during encounter.  On full liquid diet.  Objective:   Vitals:   07/22/23 1242 07/22/23 1940 07/23/23 0602 07/23/23 0803  BP: 133/79 128/78 (!) 103/55 129/86  Pulse: (!) 53 64 65 76  Resp: 16 20 20    Temp: 98.6 F (37 C) 97.7 F (36.5 C) 98.1 F (36.7 C)   TempSrc:  Oral Oral   SpO2: 100% 98% 96% 100%  Weight:      Height:        Intake/Output Summary (Last 24 hours) at 07/23/2023 1300 Last data filed at 07/23/2023 1216 Gross per 24 hour  Intake 1920 ml  Output 2700 ml  Net -780 ml     Wt  Readings from Last 3 Encounters:  07/16/23 92.1 kg  04/19/23 90.7 kg  02/27/23 88.4 kg    Physical Exam General: Alert and oriented x 3, NAD Cardiovascular: S1 S2 clear, RRR.  Respiratory: CTAB, no wheezing Gastrointestinal: Soft, mild diffuse TTP, ND, NBS  Ext: no pedal edema bilaterally Neuro: no new deficits Psych: Normal affect     Data Reviewed:  I have personally reviewed following labs    CBC Lab Results  Component Value  Date   WBC 4.6 07/23/2023   RBC 3.91 07/23/2023   HGB 11.4 (L) 07/23/2023   HCT 36.4 07/23/2023   MCV 93.1 07/23/2023   MCH 29.2 07/23/2023   PLT 215 07/23/2023   MCHC 31.3 07/23/2023   RDW 14.6 07/23/2023   LYMPHSABS 1.7 07/22/2023   MONOABS 0.5 07/22/2023   EOSABS 0.2 07/22/2023   BASOSABS 0.0 07/22/2023     Last metabolic panel Lab Results  Component Value Date   NA 138 07/23/2023   K 3.7 07/23/2023   CL 107 07/23/2023   CO2 23 07/23/2023   BUN <5 (L) 07/23/2023   CREATININE 0.81 07/23/2023   GLUCOSE 102 (H) 07/23/2023   GFRNONAA >60 07/23/2023   GFRAA 121 07/28/2019   CALCIUM 8.4 (L) 07/23/2023   PHOS 4.0 08/25/2018   PROT 6.4 (L) 07/22/2023   ALBUMIN 3.3 (L) 07/22/2023   BILITOT 0.4 07/22/2023   ALKPHOS 65 07/22/2023   AST 40 07/22/2023   ALT 37 07/22/2023   ANIONGAP 8 07/23/2023    CBG (last 3)  Recent Labs    07/20/23 2332  GLUCAP 109*      Coagulation Profile: No results for input(s): "INR", "PROTIME" in the last 168 hours.   Radiology Studies: I have personally reviewed the imaging studies  CT Angio Abd/Pel w/ and/or w/o  Result Date: 07/23/2023 CLINICAL DATA:  Abdominal pain, nausea and vomiting. 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 History of poor gastric functioning. EXAM: CT ANGIOGRAPHY ABDOMEN AND PELVIS WITH CONTRAST AND WITHOUT CONTRAST TECHNIQUE: Multidetector CT imaging of the abdomen and pelvis was performed using the standard protocol during bolus administration of intravenous  contrast. Multiplanar reconstructed images and MIPs were obtained and reviewed to evaluate the vascular anatomy. RADIATION DOSE REDUCTION: This exam was performed according to the departmental dose-optimization program which includes automated exposure control, adjustment of the mA and/or kV according to patient size and/or use of iterative reconstruction technique. CONTRAST:  OMNIPAQUE IOHEXOL 350 MG/ML SOLN COMPARISON:  Multiple prior studies with the most recent CT of the abdomen and pelvis on 07/16/2023. FINDINGS: VASCULAR Aorta: Normal abdominal aorta demonstrating normal caliber without evidence of atherosclerosis, aneurysm or dissection. No findings to suggest vasculitis. Celiac: Normally patent and normally patent branch vessels and with normal branch vessel anatomy. Normally patent arterial supply to the stomach. SMA: Normally patent. No findings currently by CTA to suggest SMA syndrome with normal distance present between the descending SMA trunk and aorta without significant compression of the transverse duodenum. Renals: Normally patent bilateral single renal arteries. IMA: Normally patent. Inflow: Normally patent bilateral iliac arteries. Proximal Outflow: Normally patent bilateral common femoral arteries and femoral bifurcations. Veins: Venous phase imaging demonstrates normal patency of venous structures in the abdomen and pelvis including mesenteric veins, portal vein and splenic vein. The IVC, renal veins, iliac veins and common femoral veins demonstrate normal patency. Review of the MIP images confirms the above findings. NON-VASCULAR Lower chest: No acute abnormality. Hepatobiliary: No focal liver abnormality is seen. Status post cholecystectomy. No biliary dilatation. Pancreas: Unremarkable. No pancreatic ductal dilatation or surrounding inflammatory changes. Spleen: Normal in size without focal abnormality. Adrenals/Urinary Tract: Adrenal glands are unremarkable. Kidneys are normal,  without renal calculi, focal lesion, or hydronephrosis. Bladder is unremarkable. Stomach/Bowel: Status post prior gastric sleeve procedure with decompressed stomach and no evidence of complication. Bowel shows some probable mild ileus involving small bowel in the left upper quadrant demonstrating mild gaseous dilatation. No overt pattern of small-bowel obstruction is  identified. The colon is decompressed. No free intraperitoneal air identified. Lymphatic: No enlarged abdominal or pelvic lymph nodes. Reproductive: Status post hysterectomy with residual left ovary visualized demonstrating small follicular cysts. The largest measures 13 mm and is likely physiologic. Other: No hernias, ascites or focal abscess identified. Musculoskeletal: No acute findings. Status post prior posterior lumbar fusion at the L5-S1 level. IMPRESSION: 1. Normal CTA of the abdomen and pelvis. No evidence of SMA syndrome currently by CTA. 2. Status post prior gastric sleeve procedure with decompressed stomach and no evidence of complication. 3. Probable mild ileus involving small bowel in the left upper quadrant demonstrating mild gaseous dilatation. No overt pattern of small-bowel obstruction is identified. 4. Status post cholecystectomy and hysterectomy. 5. Status post prior posterior lumbar fusion at the L5-S1 level. Electronically Signed   By: Irish Lack M.D.   On: 07/23/2023 08:19       Alonte Wulff M.D. [facility] 07/23/2023, 1:00 PM  Available via Epic secure chat 7am-7pm After 7 pm, please refer to night coverage provider listed on amion.

## 2023-07-23 NOTE — Progress Notes (Signed)
Patient dry heaving on entry to patient room no active vomiting noted.

## 2023-07-23 NOTE — Plan of Care (Signed)

## 2023-07-23 NOTE — Consult Note (Signed)
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  Her typical symptoms are nausea, vomiting, abdominal pain, and diarrhea that lasts 4-6 days.  She averages 1-2 admission a year and she responds to supportive care.  The patient does not know of any trigger that induces her symptoms and it starts without warning.  In the past she was diagnosed and treated for Cushing's disease and her symptoms did improve after being treated for the endocrine disorder.  This episode is unusual in that it is lasting longer than her prior courses.  Past Medical History:  Diagnosis Date   Acute appendicitis 02/28/2017   Acute pancreatitis 11/30/2013   Anemia 11/26/2011   Anxiety    Asthma    Celiac and mesenteric artery injury    Cushing's syndrome (HCC)    06/21/16- "in remission"   Depression    H/O hiatal hernia    History of kidney stones    Iatrogenic Cushing's syndrome (HCC) 10/02/2013   Nausea vomiting and diarrhea 11/23/2013   Nausea with vomiting 11/30/2013   Palpitations 12/05/2018   Pancreatitis 11/29/2013   POTS (postural orthostatic tachycardia syndrome)    Shortness of breath dyspnea    with exertertion   Sphincter of Oddi dysfunction     Past Surgical History:  Procedure Laterality Date   ABDOMINAL HYSTERECTOMY  08/2010   ANTERIOR CERVICAL DECOMP/DISCECTOMY FUSION N/A 03/31/2013   Procedure: ANTERIOR CERVICAL DECOMPRESSION/DISCECTOMY FUSION 1 LEVEL Cervical five-six;  Surgeon: Reinaldo Meeker, MD;  Location: [facility];  Service: Neurosurgery;  Laterality: N/A;   APPENDECTOMY     BACK SURGERY     Cholangio-Pancreatography with Spintectorotomy + Stent  07/16/2012   01/15/14   CHOLECYSTECTOMY      ESOPHAGOGASTRODUODENOSCOPY (EGD) WITH PROPOFOL N/A 08/19/2018   Procedure: ESOPHAGOGASTRODUODENOSCOPY (EGD) WITH PROPOFOL;  Surgeon: Charlott Rakes, MD;  Location: [facility];  Service: Endoscopy;  Laterality: N/A;   LAPAROSCOPIC APPENDECTOMY N/A 02/28/2017   Procedure: APPENDECTOMY LAPAROSCOPIC;  Surgeon: Harriette Bouillon, MD;  Location: [facility];  Service: General;  Laterality: N/A;   LAPAROSCOPIC ENDOMETRIOSIS FULGURATION     LAPAROSCOPIC GASTRIC SLEEVE RESECTION N/A 07/15/2018   Procedure: LAPAROSCOPIC GASTRIC SLEEVE RESECTION, UPPER ENDO, ERAS Pathway;  Surgeon: Luretha Beahm, MD;  Location: [facility];  Service: General;  Laterality: N/A;   LUMBAR LAMINECTOMY     LUMBAR LAMINECTOMY/DECOMPRESSION MICRODISCECTOMY Left 06/22/2016   Procedure: MICRODISCECTOMY LEFT LUMBAR FOUR-FIVE;  Surgeon: Lisbeth Renshaw, MD;  Location: [facility];  Service: Neurosurgery;  Laterality: Left;   ROUX-EN-Y PROCEDURE  04/1999    Family History  Problem Relation Age of Onset   Hypertension Mother     Social History:  reports that she has never smoked. She has never been exposed to tobacco smoke. She has never used smokeless tobacco. She reports current alcohol use of about 5.0 standard drinks of alcohol per week. She reports that she does not use drugs.  Allergies:  Allergies  Allergen Reactions   Bee Venom Anaphylaxis   Reglan [Metoclopramide] Other (See Comments)    Reaction:  Oculogyric crisis    Shellfish Allergy Anaphylaxis   Adhesive [Tape] Hives   Measles, Mumps & Rubella Vac     Other reaction(s): slept for 28 hrs  Sulfa Antibiotics Rash   Yellow Dye Nausea Only and Rash    Occurred from oral iron with yellow enteric coating Has not tried any other oral iron preparation (so can't rule out any other excipient as causative agent)    Medications: Scheduled:  acebutolol  200 mg Oral BID   ARIPiprazole ER  400 mg Intramuscular Q28 days   busPIRone  20 mg Oral TID   cyanocobalamin  1,000  mcg Subcutaneous Q30 days   desipramine  25 mg Oral QHS   diltiazem  180 mg Oral Daily   enoxaparin (LOVENOX) injection  40 mg Subcutaneous Q24H   ivabradine  5 mg Oral BID WC   And   ivabradine  2.5 mg Oral BID WC   lamoTRIgine  100 mg Oral QHS   montelukast  10 mg Oral QHS   pantoprazole  40 mg Oral Daily   zolpidem  5 mg Oral QHS   Continuous:  ondansetron 8 mg (07/23/23 1527)   promethazine (PHENERGAN) injection (IM or IVPB) 25 mg (07/23/23 0947)    Results for orders placed or performed during the hospital encounter of 07/16/23 (from the past 24 hour(s))  CBC     Status: Abnormal   Collection Time: 07/23/23  3:52 AM  Result Value Ref Range   WBC 4.6 4.0 - 10.5 K/uL   RBC 3.91 3.87 - 5.11 MIL/uL   Hemoglobin 11.4 (L) 12.0 - 15.0 g/dL   HCT 25.3 66.4 - 40.3 %   MCV 93.1 80.0 - 100.0 fL   MCH 29.2 26.0 - 34.0 pg   MCHC 31.3 30.0 - 36.0 g/dL   RDW 47.4 25.9 - 56.3 %   Platelets 215 150 - 400 K/uL   nRBC 0.0 0.0 - 0.2 %  Basic metabolic panel     Status: Abnormal   Collection Time: 07/23/23  3:52 AM  Result Value Ref Range   Sodium 138 135 - 145 mmol/L   Potassium 3.7 3.5 - 5.1 mmol/L   Chloride 107 98 - 111 mmol/L   CO2 23 22 - 32 mmol/L   Glucose, Bld 102 (H) 70 - 99 mg/dL   BUN <5 (L) 6 - 20 mg/dL   Creatinine, Ser 8.75 0.44 - 1.00 mg/dL   Calcium 8.4 (L) 8.9 - 10.3 mg/dL   GFR, Estimated >64 >33 mL/min   Anion gap 8 5 - 15     CT Angio Abd/Pel w/ and/or w/o  Result Date: 07/23/2023 CLINICAL DATA:  Abdominal pain, nausea and vomiting. 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 History of poor gastric functioning. EXAM: CT ANGIOGRAPHY ABDOMEN AND PELVIS WITH CONTRAST AND WITHOUT CONTRAST TECHNIQUE: Multidetector CT imaging of the abdomen and pelvis was performed using the standard protocol during bolus administration of intravenous contrast. Multiplanar reconstructed images and MIPs were obtained and reviewed to evaluate the vascular anatomy. RADIATION  DOSE REDUCTION: This exam was performed according to the departmental dose-optimization program which includes automated exposure control, adjustment of the mA and/or kV according to patient size and/or use of iterative reconstruction technique. CONTRAST:  OMNIPAQUE IOHEXOL 350 MG/ML SOLN COMPARISON:  Multiple prior studies with the most recent CT of the abdomen and pelvis on 07/16/2023. FINDINGS: VASCULAR Aorta: Normal abdominal aorta demonstrating normal caliber without evidence of atherosclerosis, aneurysm or dissection. No findings to suggest vasculitis. Celiac: Normally patent and normally patent branch vessels and with normal branch vessel anatomy. Normally patent arterial supply to the stomach. SMA: Normally patent. No findings currently by  CTA to suggest SMA syndrome with normal distance present between the descending SMA trunk and aorta without significant compression of the transverse duodenum. Renals: Normally patent bilateral single renal arteries. IMA: Normally patent. Inflow: Normally patent bilateral iliac arteries. Proximal Outflow: Normally patent bilateral common femoral arteries and femoral bifurcations. Veins: Venous phase imaging demonstrates normal patency of venous structures in the abdomen and pelvis including mesenteric veins, portal vein and splenic vein. The IVC, renal veins, iliac veins and common femoral veins demonstrate normal patency. Review of the MIP images confirms the above findings. NON-VASCULAR Lower chest: No acute abnormality. Hepatobiliary: No focal liver abnormality is seen. Status post cholecystectomy. No biliary dilatation. Pancreas: Unremarkable. No pancreatic ductal dilatation or surrounding inflammatory changes. Spleen: Normal in size without focal abnormality. Adrenals/Urinary Tract: Adrenal glands are unremarkable. Kidneys are normal, without renal calculi, focal lesion, or hydronephrosis. Bladder is unremarkable. Stomach/Bowel: Status post prior gastric sleeve  procedure with decompressed stomach and no evidence of complication. Bowel shows some probable mild ileus involving small bowel in the left upper quadrant demonstrating mild gaseous dilatation. No overt pattern of small-bowel obstruction is identified. The colon is decompressed. No free intraperitoneal air identified. Lymphatic: No enlarged abdominal or pelvic lymph nodes. Reproductive: Status post hysterectomy with residual left ovary visualized demonstrating small follicular cysts. The largest measures 13 mm and is likely physiologic. Other: No hernias, ascites or focal abscess identified. Musculoskeletal: No acute findings. Status post prior posterior lumbar fusion at the L5-S1 level. IMPRESSION: 1. Normal CTA of the abdomen and pelvis. No evidence of SMA syndrome currently by CTA. 2. Status post prior gastric sleeve procedure with decompressed stomach and no evidence of complication. 3. Probable mild ileus involving small bowel in the left upper quadrant demonstrating mild gaseous dilatation. No overt pattern of small-bowel obstruction is identified. 4. Status post cholecystectomy and hysterectomy. 5. Status post prior posterior lumbar fusion at the L5-S1 level. Electronically Signed   By: Irish Lack M.D.   On: 07/23/2023 08:19    ROS:  As stated above in the HPI otherwise negative.  Blood pressure 123/82, pulse 64, temperature 98.1 F (36.7 C), temperature source Oral, resp. rate 20, height 5\' 9"  (1.753 m), weight 92.1 kg, SpO2 100%.    PE: Gen: NAD, Alert and Oriented HEENT:  [place]/AT, EOMI Neck: Supple, no LAD Lungs: CTA Bilaterally CV: RRR without M/G/R ABD: Soft, tender in the upper abdomen, +BS Ext: No C/C/E  Assessment/Plan: 1) Nausea/vomiting. 2) Dysautonomia. 3) Abdominal pain.   The patient is stable, but she does not seem to be progressing.  She states that [facility] accepted her for transfer to continue her care.  She is a complex patient and she is currently being well managed with  supportive care.  The patient does not report any specific intervention that resolves her symptoms outside of time.  Since her hospitalization is much longer than usual, it is reasonable to transfer to [facility] for further management.  Plan: 1) Agree with the Buspar, desipramine, Dilaudid, and pantoprazole. 2) Maintain IV hydration. 3) Advance diet as tolerated. 4) Agree with transfer to [facility].   Kaitlyn Johnson D 07/23/2023, 5:49 PM

## 2023-07-24 DIAGNOSIS — Z91041 Radiographic dye allergy status: Secondary | ICD-10-CM | POA: Diagnosis not present

## 2023-07-24 DIAGNOSIS — R1013 Epigastric pain: Secondary | ICD-10-CM | POA: Diagnosis not present

## 2023-07-24 DIAGNOSIS — R1110 Vomiting, unspecified: Secondary | ICD-10-CM | POA: Diagnosis not present

## 2023-07-24 DIAGNOSIS — K5989 Other specified functional intestinal disorders: Secondary | ICD-10-CM | POA: Diagnosis not present

## 2023-07-24 DIAGNOSIS — R634 Abnormal weight loss: Secondary | ICD-10-CM | POA: Diagnosis not present

## 2023-07-24 DIAGNOSIS — D6804 Acquired von Willebrand disease: Secondary | ICD-10-CM | POA: Diagnosis not present

## 2023-07-24 DIAGNOSIS — E249 Cushing's syndrome, unspecified: Secondary | ICD-10-CM | POA: Diagnosis not present

## 2023-07-24 DIAGNOSIS — K3184 Gastroparesis: Secondary | ICD-10-CM | POA: Diagnosis not present

## 2023-07-24 DIAGNOSIS — F32A Depression, unspecified: Secondary | ICD-10-CM | POA: Diagnosis not present

## 2023-07-24 DIAGNOSIS — K551 Chronic vascular disorders of intestine: Secondary | ICD-10-CM | POA: Diagnosis not present

## 2023-07-24 DIAGNOSIS — R112 Nausea with vomiting, unspecified: Secondary | ICD-10-CM | POA: Diagnosis not present

## 2023-07-24 DIAGNOSIS — J45909 Unspecified asthma, uncomplicated: Secondary | ICD-10-CM | POA: Diagnosis not present

## 2023-07-24 DIAGNOSIS — R1010 Upper abdominal pain, unspecified: Secondary | ICD-10-CM | POA: Diagnosis not present

## 2023-07-24 DIAGNOSIS — Z888 Allergy status to other drugs, medicaments and biological substances status: Secondary | ICD-10-CM | POA: Diagnosis not present

## 2023-07-24 DIAGNOSIS — R000 Tachycardia, unspecified: Secondary | ICD-10-CM | POA: Diagnosis not present

## 2023-07-24 DIAGNOSIS — Z91030 Bee allergy status: Secondary | ICD-10-CM | POA: Diagnosis not present

## 2023-07-24 DIAGNOSIS — Z79899 Other long term (current) drug therapy: Secondary | ICD-10-CM | POA: Diagnosis not present

## 2023-07-24 DIAGNOSIS — R110 Nausea: Secondary | ICD-10-CM | POA: Diagnosis not present

## 2023-07-24 DIAGNOSIS — Z882 Allergy status to sulfonamides status: Secondary | ICD-10-CM | POA: Diagnosis not present

## 2023-07-24 DIAGNOSIS — F419 Anxiety disorder, unspecified: Secondary | ICD-10-CM | POA: Diagnosis not present

## 2023-07-24 DIAGNOSIS — Z91013 Allergy to seafood: Secondary | ICD-10-CM | POA: Diagnosis not present

## 2023-07-24 DIAGNOSIS — Z87892 Personal history of anaphylaxis: Secondary | ICD-10-CM | POA: Diagnosis not present

## 2023-07-24 DIAGNOSIS — G8929 Other chronic pain: Secondary | ICD-10-CM | POA: Diagnosis not present

## 2023-07-24 DIAGNOSIS — E876 Hypokalemia: Secondary | ICD-10-CM | POA: Diagnosis not present

## 2023-07-24 DIAGNOSIS — G90A Postural orthostatic tachycardia syndrome (POTS): Secondary | ICD-10-CM | POA: Diagnosis not present

## 2023-07-24 DIAGNOSIS — K5939 Other megacolon: Secondary | ICD-10-CM | POA: Diagnosis not present

## 2023-07-24 DIAGNOSIS — Z4659 Encounter for fitting and adjustment of other gastrointestinal appliance and device: Secondary | ICD-10-CM | POA: Diagnosis not present

## 2023-07-24 DIAGNOSIS — J4520 Mild intermittent asthma, uncomplicated: Secondary | ICD-10-CM | POA: Diagnosis not present

## 2023-07-24 LAB — BASIC METABOLIC PANEL
Anion gap: 10 (ref 5–15)
BUN: 5 mg/dL — ABNORMAL LOW (ref 6–20)
CO2: 24 mmol/L (ref 22–32)
Calcium: 8.7 mg/dL — ABNORMAL LOW (ref 8.9–10.3)
Chloride: 103 mmol/L (ref 98–111)
Creatinine, Ser: 0.78 mg/dL (ref 0.44–1.00)
GFR, Estimated: >60 mL/min (ref >60)
Glucose, Bld: 95 mg/dL (ref 70–99)
Potassium: 3.8 mmol/L (ref 3.5–5.1)
Sodium: 137 mmol/L (ref 135–145)

## 2023-07-24 MED ORDER — DICYCLOMINE HCL 10 MG PO CAPS: 10.0000 mg | ORAL_CAPSULE | Freq: Three times a day (TID) | ORAL | Status: AC | PRN

## 2023-07-24 NOTE — Progress Notes (Addendum)
   07/24/23 2014  Vitals  Temp 97.8 F (36.6 C)  BP 118/74  MAP (mmHg) 88  BP Location Left Arm  BP Method Automatic  Patient Position (if appropriate) Lying  Pulse Rate 61  Pulse Rate Source Monitor  Resp 20   Patient discharged to [facility] via carelink. Patient is alert and oriented x 4, vss above. Report was given to [facility] RN; patient was administered phenergan 25 mg ivpb for nausea, and dilaudid 0.5 mg iv was administered for abdominal pain , rated 4/10. EMTALA and discharge papers were sent with patient via carelink nurse. Patient also discharged with #20 iv angiocath, no redness, swelling or drainage noted to rt fa. Per Carelink RN request please leave IV in place.

## 2023-07-24 NOTE — Discharge Summary (Signed)
TRANSFER SUMMARY   Mccauley Mergen CSN:[id number],MRN:[id number]  is a 42 y.o. female  Outpatient Primary MD for the patient is Garnette Gunner, MD Admission date: 07/16/2023 Transfer Date 07/24/2023 Admitting Physician Zannie Cove, MD  Place of Transfer : [facility] Accepting MD: Dr Gearldine Bienenstock Mode: [facility] Condition: Stable  Admission Diagnosis  Gastroparesis [K31.84] Intractable nausea [R11.0] Nausea and vomiting [R11.2]  Discharge Diagnosis     Intractable nausea and vomiting, refractory gastroparesis Dysautonomia due to POTS Asthma History of sleeve gastrectomy Anxiety/insomnia/mood disorder Chronic back pain with history of cervical and lumbar spine surgeries   Past Medical History:  Diagnosis Date   Acute appendicitis 02/28/2017   Acute pancreatitis 11/30/2013   Anemia 11/26/2011   Anxiety    Asthma    Celiac and mesenteric artery injury    Cushing's syndrome (HCC)    06/21/16- "in remission"   Depression    H/O hiatal hernia    History of kidney stones    Iatrogenic Cushing's syndrome (HCC) 10/02/2013   Nausea vomiting and diarrhea 11/23/2013   Nausea with vomiting 11/30/2013   Palpitations 12/05/2018   Pancreatitis 11/29/2013   POTS (postural orthostatic tachycardia syndrome)    Shortness of breath dyspnea    with exertertion   Sphincter of Oddi dysfunction     Past Surgical History:  Procedure Laterality Date   ABDOMINAL HYSTERECTOMY  08/2010   ANTERIOR CERVICAL DECOMP/DISCECTOMY FUSION N/A 03/31/2013   Procedure: ANTERIOR CERVICAL DECOMPRESSION/DISCECTOMY FUSION 1 LEVEL Cervical five-six;  Surgeon: Reinaldo Meeker, MD;  Location: [facility];  Service: Neurosurgery;  Laterality: N/A;   APPENDECTOMY     BACK SURGERY     Cholangio-Pancreatography with Spintectorotomy + Stent  07/16/2012   01/15/14   CHOLECYSTECTOMY     ESOPHAGOGASTRODUODENOSCOPY (EGD) WITH PROPOFOL N/A 08/19/2018   Procedure: ESOPHAGOGASTRODUODENOSCOPY (EGD) WITH  PROPOFOL;  Surgeon: Charlott Rakes, MD;  Location: [facility];  Service: Endoscopy;  Laterality: N/A;   LAPAROSCOPIC APPENDECTOMY N/A 02/28/2017   Procedure: APPENDECTOMY LAPAROSCOPIC;  Surgeon: Harriette Bouillon, MD;  Location: [facility];  Service: General;  Laterality: N/A;   LAPAROSCOPIC ENDOMETRIOSIS FULGURATION     LAPAROSCOPIC GASTRIC SLEEVE RESECTION N/A 07/15/2018   Procedure: LAPAROSCOPIC GASTRIC SLEEVE RESECTION, UPPER ENDO, ERAS Pathway;  Surgeon: Luretha Mcgrory, MD;  Location: [facility];  Service: General;  Laterality: N/A;   LUMBAR LAMINECTOMY     LUMBAR LAMINECTOMY/DECOMPRESSION MICRODISCECTOMY Left 06/22/2016   Procedure: MICRODISCECTOMY LEFT LUMBAR FOUR-FIVE;  Surgeon: Lisbeth Renshaw, MD;  Location: [facility];  Service: Neurosurgery;  Laterality: Left;   ROUX-EN-Y PROCEDURE  04/1999    Consults gastroenterology   Hospital Course See H&P for all details in brief, Patient is a 42 year old female with complex medical history, per [facility] has poorly functional stomach (both for accommodation and emptying which is felt to be multifactorial, postoperative, dysautonomia and functional component), remote history of superior mesenteric artery syndrome at age 74, followed by cholecystitis and cholecystectomy, POTS, asthma, anxiety, history of Cushing syndrome, ruptured appendix and appendectomy in 2018, followed by gastric sleeve for obesity 2019 presented to the ED on 07/16/2023 with nausea, vomiting and abdominal pain for 6 days. Patient reported intermittent episodes of nausea and vomiting, she tried bland diet, antiemetics at home with limited success, presented to the ED, labs and CT were unremarkable, admitted due to persistence of symptoms   Assessment and plan Intractable nausea and vomiting with complex medical history, dysautonomia, gastroparesis -Per [facility] notes, chronic nausea, poorly functional stomach felt to have both  for accommodation and emptying suspected to be from  dysautonomia, postoperative changes and functional component. - on full liquid diet, CT abdomen pelvis on admission negative for acute abdominal pathology. -Continue IV Zofran, IV Phenergan as needed, she is allergic to Reglan -Currently on Protonix 40 mg p.o. daily.  Lipase normal. -CTA abdomen pelvis showed no acute or chronic mesenteric ischemia, no SMA syndrome, probable mild ileus involving the small bowel in the left upper quadrant.  -GI was consulted, seen by Dr. Elnoria Howard.  Per GI, she is currently being managed well with supportive care however she does not seem to be progressing, it is reasonable to transfer to [facility] for further management. -Patient also contacted her gastroenterologist, Dr. [place] Constable Crocker who also requested transfer to [facility] for further care.  [facility] was contacted, she is accepted for further management Accepting MD: Dr Gearldine Bienenstock.    POTS -Continue home Corlanor, Cardizem   History of asthma: Stable -Xopenex as needed -Continue home Singulair   History of sleeve gastrectomy   Anxiety/insomnia/mood disorder:  - Gets monthly Abilify injection. -Continue home BuSpar, Klonopin, Ambien, Lamictal and trazodone   Chronic back pain/history of cervical and lumbar spine surgeries:  Stable     Estimated body mass index is 29.98 kg/m as calculated from the following:   Height as of this encounter: 5\' 9"  (1.753 m).   Weight as of this encounter: 92.1 kg.    Today   Subjective:   Jilda Roche today still continues to have persistent nausea, had 3 episodes of diarrhea last night.  Still continues to have abdominal discomfort, in LUQ, no peritoneal signs.  No fevers or chills.  Objective:   Blood pressure 117/80, pulse 70, temperature 97.7 F (36.5 C), temperature source Oral, resp. rate 20, height 5\' 9"  (1.753 m), weight 92.1 kg, SpO2 100%.  Intake/Output Summary (Last 24 hours) at 07/24/2023 1446 Last data filed at 07/23/2023 1700 Gross per 24  hour  Intake 360 ml  Output 900 ml  Net -540 ml   Physical Exam General: Alert and oriented x 3, NAD Cardiovascular: S1 S2 clear, RRR.  Respiratory: CTAB, no wheezing, rales Gastrointestinal: Soft, mild upper abdominal TTP, nondistended, NBS Ext: no pedal edema bilaterally Neuro: no new deficits Psych: Normal affect   Data Review  CBC w Diff:  Lab Results  Component Value Date   WBC 4.6 07/23/2023   HGB 11.4 (L) 07/23/2023   HGB 12.6 11/11/2012   HCT 36.4 07/23/2023   HCT 36.8 11/11/2012   PLT 215 07/23/2023   PLT 245 11/11/2012   LYMPHOPCT 33 07/22/2023   LYMPHOPCT 25.5 11/11/2012   BANDSPCT 1 12/28/2013   MONOPCT 10 07/22/2023   MONOPCT 6.9 11/11/2012   EOSPCT 4 07/22/2023   EOSPCT 0.8 11/11/2012   BASOPCT 0 07/22/2023   BASOPCT 0.3 11/11/2012   CMP:  Lab Results  Component Value Date   NA 137 07/24/2023   NA 140 07/28/2019   NA 138 09/07/2013   K 3.8 07/24/2023   K 3.7 09/07/2013   CL 103 07/24/2023   CL 102 11/11/2012   CO2 24 07/24/2023   CO2 23 09/07/2013   BUN 5 (L) 07/24/2023   BUN 5 (L) 07/28/2019   BUN 12.4 09/07/2013   CREATININE 0.78 07/24/2023   CREATININE 0.81 07/06/2022   CREATININE 0.8 09/07/2013   PROT 6.4 (L) 07/22/2023   PROT 7.1 09/07/2013   ALBUMIN 3.3 (L) 07/22/2023   ALBUMIN 3.6 09/07/2013   BILITOT 0.4 07/22/2023  BILITOT <0.20 09/07/2013   ALKPHOS 65 07/22/2023   ALKPHOS 81 09/07/2013   AST 40 07/22/2023   AST 14 09/07/2013   ALT 37 07/22/2023   ALT 28 09/07/2013  .  Significant Tests   Significant Diagnostic Studies:  CT ABDOMEN PELVIS W CONTRAST  Result Date: 07/16/2023 CLINICAL DATA:  Worsening left upper quadrant pain and left back pain for approximately 1 week. Nausea and vomiting. EXAM: CT ABDOMEN AND PELVIS WITH CONTRAST TECHNIQUE: Multidetector CT imaging of the abdomen and pelvis was performed using the standard protocol following bolus administration of intravenous contrast. RADIATION DOSE REDUCTION: This exam  was performed according to the departmental dose-optimization program which includes automated exposure control, adjustment of the mA and/or kV according to patient size and/or use of iterative reconstruction technique. CONTRAST:  80mL OMNIPAQUE IOHEXOL 300 MG/ML  SOLN COMPARISON:  01/02/2023 and 08/18/2018 FINDINGS: Lower Chest: No acute findings. Hepatobiliary: No suspicious hepatic masses identified. Prior cholecystectomy. No evidence of biliary obstruction. Pancreas:  No mass or inflammatory changes. Spleen: Within normal limits in size and appearance. Adrenals/Urinary Tract: No suspicious masses identified. No evidence of ureteral calculi or hydronephrosis. Stomach/Bowel: Prior sleeve gastrectomy noted. No evidence of obstruction, inflammatory process or abnormal fluid collections. Vascular/Lymphatic: No pathologically enlarged lymph nodes. No acute vascular findings. Reproductive: Prior hysterectomy. Adnexal regions are unremarkable. Other:  None. Musculoskeletal:  No suspicious bone lesions identified. IMPRESSION: No acute findings or other significant abnormality. Electronically Signed   By: Danae Orleans M.D.   On: 07/16/2023 11:46      Scheduled Meds:  acebutolol  200 mg Oral BID   ARIPiprazole ER  400 mg Intramuscular Q28 days   busPIRone  20 mg Oral TID   cyanocobalamin  1,000 mcg Subcutaneous Q30 days   desipramine  25 mg Oral QHS   diltiazem  180 mg Oral Daily   enoxaparin (LOVENOX) injection  40 mg Subcutaneous Q24H   ivabradine  5 mg Oral BID WC   And   ivabradine  2.5 mg Oral BID WC   lamoTRIgine  100 mg Oral QHS   montelukast  10 mg Oral QHS   pantoprazole  40 mg Oral Daily   zolpidem  5 mg Oral QHS   Continuous Infusions:  ondansetron 8 mg (07/24/23 0323)   promethazine (PHENERGAN) injection (IM or IVPB) 25 mg (07/24/23 1047)       The risks and  Benefits of transporting were discussed with the patient and was agreeable to the plan and further management.  Total Time  in preparing paper work, todays exam and data evaluation: 35 minutes  Signed: Thad Ranger M.D. [facility] 07/24/2023, 2:46 PM

## 2023-07-24 NOTE — Progress Notes (Signed)
Contacted by [facility] transfer center that a bed is ready 8207. Transport not yet set up, as patient does not have a transfer order in yet, reached out to on call hospitalist Johann Capers NP to get transfer order placed, waiting on order. Per [facility] transfer center patient will need a copy of chart, discharge summary, and powershare of imaging results. Attempted to call report at [phone], facility does not want report until after 0720.

## 2023-07-24 NOTE — Progress Notes (Signed)
Attempted to call report to [facility] at [facility], nurse unable to take call at this moment ,call back number given to nurse;  verbalizes will call this writer back.

## 2023-07-24 NOTE — Plan of Care (Signed)
Problem: Education: Goal: Knowledge of General Education information will improve Description: Including pain rating scale, medication(s)/side effects and non-pharmacologic comfort measures Outcome: Progressing   Problem: Health Behavior/Discharge Planning: Goal: Ability to manage health-related needs will improve Outcome: Progressing   Problem: Nutrition: Goal: Adequate nutrition will be maintained Outcome: Progressing   Problem: Coping: Goal: Level of anxiety will decrease Outcome: Progressing   Problem: Elimination: Goal: Will not experience complications related to bowel motility Outcome: Progressing Goal: Will not experience complications related to urinary retention Outcome: Progressing   Problem: Pain Managment: Goal: General experience of comfort will improve Outcome: Progressing   Problem: Safety: Goal: Ability to remain free from injury will improve Outcome: Progressing

## 2023-07-25 ENCOUNTER — Telehealth: Payer: Self-pay

## 2023-07-25 NOTE — Transitions of Care (Post Inpatient/ED Visit) (Signed)
07/25/2023  Name: Jimmy Dunnington MRN: [id number] DOB: 10-Nov-1980  Today's TOC FU Call Status: Today's TOC FU Call Status:: Unsuccessful Call (1st Attempt) Unsuccessful Call (1st Attempt) Date: 07/25/23  Attempted to reach the patient regarding the most recent Inpatient/ED visit.  Follow Up Plan: Additional outreach attempts will be made to reach the patient to complete the Transitions of Care (Post Inpatient/ED visit) call.   Signature Arvil Persons, BSN, Charity fundraiser

## 2023-08-01 ENCOUNTER — Encounter: Payer: Self-pay | Admitting: Family Medicine

## 2023-08-01 NOTE — Telephone Encounter (Signed)
No further action needed.

## 2023-08-08 DIAGNOSIS — R110 Nausea: Secondary | ICD-10-CM | POA: Diagnosis not present

## 2023-08-08 DIAGNOSIS — K3189 Other diseases of stomach and duodenum: Secondary | ICD-10-CM | POA: Diagnosis not present

## 2023-08-08 DIAGNOSIS — G901 Familial dysautonomia [Riley-Day]: Secondary | ICD-10-CM | POA: Diagnosis not present

## 2023-08-08 DIAGNOSIS — R112 Nausea with vomiting, unspecified: Secondary | ICD-10-CM | POA: Diagnosis not present

## 2023-08-12 DIAGNOSIS — R1110 Vomiting, unspecified: Secondary | ICD-10-CM | POA: Diagnosis not present

## 2023-08-12 DIAGNOSIS — R634 Abnormal weight loss: Secondary | ICD-10-CM | POA: Diagnosis not present

## 2023-08-13 ENCOUNTER — Encounter: Payer: Self-pay | Admitting: Cardiology

## 2023-08-13 ENCOUNTER — Telehealth: Payer: Self-pay | Admitting: Cardiology

## 2023-08-13 NOTE — Telephone Encounter (Signed)
Kaitlyn Johnson from Dr. Patsey Berthold office called to give another phone #. It is [phone] option 2

## 2023-08-13 NOTE — Telephone Encounter (Signed)
  Pt c/o medication issue:  1. Name of Medication: Mestinon   2. How are you currently taking this medication (dosage and times per day)?   3. Are you having a reaction (difficulty breathing--STAT)?   4. What is your medication issue? Boneta Lucks from Dr. Jill Alexanders Crocker's office calling, Dr. Adriana Simas wants to ask Dr. Jacinto Halim if pt can take Mestinon in addition to the Ivabradine. She said, to call Dr. Adriana Simas directly at [phone]

## 2023-08-13 NOTE — Telephone Encounter (Signed)
[facility] would like to start the patient on Mestinon, patient is presently on Corlanor, I do not see any contraindication.  I called and left a message on the [facility] telephone line.

## 2023-08-16 DIAGNOSIS — G901 Familial dysautonomia [Riley-Day]: Secondary | ICD-10-CM | POA: Diagnosis not present

## 2023-08-16 DIAGNOSIS — K3184 Gastroparesis: Secondary | ICD-10-CM | POA: Diagnosis not present

## 2023-08-16 DIAGNOSIS — R110 Nausea: Secondary | ICD-10-CM | POA: Diagnosis not present

## 2023-08-16 DIAGNOSIS — R112 Nausea with vomiting, unspecified: Secondary | ICD-10-CM | POA: Diagnosis not present

## 2023-08-26 NOTE — Transitions of Care (Post Inpatient/ED Visit) (Signed)
   08/26/2023  Name: Kaitlyn Johnson MRN: [id number] DOB: 1981/03/15  Today's TOC FU Call Status: Today's TOC FU Call Status:: Unsuccessful Call (1st Attempt) Unsuccessful Call (1st Attempt) Date: 07/25/23  Attempted to reach the patient regarding the most recent Inpatient/ED visit.  Follow Up Plan: No further outreach attempts will be made at this time. We have been unable to contact the patient.  Signature Arvil Persons, BSN, Charity fundraiser

## 2023-08-30 DIAGNOSIS — Y831 Surgical operation with implant of artificial internal device as the cause of abnormal reaction of the patient, or of later complication, without mention of misadventure at the time of the procedure: Secondary | ICD-10-CM | POA: Diagnosis not present

## 2023-08-30 DIAGNOSIS — K3184 Gastroparesis: Secondary | ICD-10-CM | POA: Diagnosis not present

## 2023-08-30 DIAGNOSIS — R1011 Right upper quadrant pain: Secondary | ICD-10-CM | POA: Diagnosis not present

## 2023-08-30 DIAGNOSIS — Z4659 Encounter for fitting and adjustment of other gastrointestinal appliance and device: Secondary | ICD-10-CM | POA: Diagnosis not present

## 2023-08-30 DIAGNOSIS — T85528A Displacement of other gastrointestinal prosthetic devices, implants and grafts, initial encounter: Secondary | ICD-10-CM | POA: Diagnosis not present

## 2023-09-02 DIAGNOSIS — R1110 Vomiting, unspecified: Secondary | ICD-10-CM | POA: Diagnosis not present

## 2023-09-02 DIAGNOSIS — R634 Abnormal weight loss: Secondary | ICD-10-CM | POA: Diagnosis not present

## 2023-09-05 DIAGNOSIS — G4700 Insomnia, unspecified: Secondary | ICD-10-CM | POA: Diagnosis not present

## 2023-09-05 DIAGNOSIS — G90A Postural orthostatic tachycardia syndrome (POTS): Secondary | ICD-10-CM | POA: Diagnosis not present

## 2023-09-05 DIAGNOSIS — Z882 Allergy status to sulfonamides status: Secondary | ICD-10-CM | POA: Diagnosis not present

## 2023-09-05 DIAGNOSIS — Z6828 Body mass index (BMI) 28.0-28.9, adult: Secondary | ICD-10-CM | POA: Diagnosis not present

## 2023-09-05 DIAGNOSIS — Z9049 Acquired absence of other specified parts of digestive tract: Secondary | ICD-10-CM | POA: Diagnosis not present

## 2023-09-05 DIAGNOSIS — K3184 Gastroparesis: Secondary | ICD-10-CM | POA: Diagnosis not present

## 2023-09-05 DIAGNOSIS — F32A Depression, unspecified: Secondary | ICD-10-CM | POA: Diagnosis not present

## 2023-09-05 DIAGNOSIS — K529 Noninfective gastroenteritis and colitis, unspecified: Secondary | ICD-10-CM | POA: Diagnosis not present

## 2023-09-05 DIAGNOSIS — R1011 Right upper quadrant pain: Secondary | ICD-10-CM | POA: Diagnosis not present

## 2023-09-05 DIAGNOSIS — Z91030 Bee allergy status: Secondary | ICD-10-CM | POA: Diagnosis not present

## 2023-09-05 DIAGNOSIS — Z87892 Personal history of anaphylaxis: Secondary | ICD-10-CM | POA: Diagnosis not present

## 2023-09-05 DIAGNOSIS — K3189 Other diseases of stomach and duodenum: Secondary | ICD-10-CM | POA: Diagnosis not present

## 2023-09-05 DIAGNOSIS — Z903 Acquired absence of stomach [part of]: Secondary | ICD-10-CM | POA: Diagnosis not present

## 2023-09-05 DIAGNOSIS — J4520 Mild intermittent asthma, uncomplicated: Secondary | ICD-10-CM | POA: Diagnosis not present

## 2023-09-05 DIAGNOSIS — D649 Anemia, unspecified: Secondary | ICD-10-CM | POA: Diagnosis not present

## 2023-09-05 DIAGNOSIS — E441 Mild protein-calorie malnutrition: Secondary | ICD-10-CM | POA: Diagnosis not present

## 2023-09-05 DIAGNOSIS — Z431 Encounter for attention to gastrostomy: Secondary | ICD-10-CM | POA: Diagnosis not present

## 2023-09-05 DIAGNOSIS — Z4659 Encounter for fitting and adjustment of other gastrointestinal appliance and device: Secondary | ICD-10-CM | POA: Diagnosis not present

## 2023-09-05 DIAGNOSIS — K9423 Gastrostomy malfunction: Secondary | ICD-10-CM | POA: Diagnosis not present

## 2023-09-05 DIAGNOSIS — F419 Anxiety disorder, unspecified: Secondary | ICD-10-CM | POA: Diagnosis not present

## 2023-09-11 ENCOUNTER — Other Ambulatory Visit (HOSPITAL_COMMUNITY): Payer: Self-pay

## 2023-09-11 ENCOUNTER — Encounter (HOSPITAL_COMMUNITY): Payer: Self-pay

## 2023-09-11 ENCOUNTER — Other Ambulatory Visit: Payer: Self-pay

## 2023-09-11 MED ORDER — ABILIFY MAINTENA 400 MG IM PRSY
400.0000 mg | PREFILLED_SYRINGE | INTRAMUSCULAR | 1 refills | Status: AC
  Filled 2023-09-11: qty 1, 28d supply, fill #0

## 2023-09-13 ENCOUNTER — Other Ambulatory Visit: Payer: Self-pay

## 2023-09-13 NOTE — Progress Notes (Signed)
Transferred prescription for Abilify to [facility] in [place] as requested by patient/provider. Disenrolled from Transport planner.

## 2023-09-16 ENCOUNTER — Telehealth: Payer: Self-pay | Admitting: Family Medicine

## 2023-09-16 NOTE — Telephone Encounter (Signed)
Pt scheduled appt via mychart for hospital f/u 09/17/2023 with Dr. Janee Morn. She discharged from [facility] 09/11/2023  Right upper quadrant abdominal pain (Primary Dx); S/P gastrostomy tube (G tube) placement

## 2023-09-16 NOTE — Transitions of Care (Post Inpatient/ED Visit) (Signed)
   09/16/2023  Name: Kaitlyn Johnson MRN: [id number] DOB: 09-Apr-1981  Today's TOC FU Call Status: Today's TOC FU Call Status:: Successful TOC FU Call Completed TOC FU Call Complete Date: 09/16/23 Patient's Name and Date of Birth confirmed.  Transition Care Management Follow-up Telephone Call Date of Discharge: 09/11/23 Discharge Facility: Other ([facility]) Name of Other ([facility]) Discharge Facility: [facility] Type of Discharge: Emergency Department How have you been since you were released from the hospital?: Better Any questions or concerns?: No  Items Reviewed: Did you receive and understand the discharge instructions provided?: Yes Any new allergies since your discharge?: No Dietary orders reviewed?: Yes  Medications Reviewed Today: Medications Reviewed Today   Medications were not reviewed in this encounter     Home Care and Equipment/Supplies: Were Home Health Services Ordered?: Yes Has Agency set up a time to come to your home?: No Any new equipment or medical supplies ordered?: No  Functional Questionnaire: Do you need assistance with bathing/showering or dressing?: No Do you need assistance with meal preparation?: No Do you need assistance with eating?: No Do you have difficulty maintaining continence: No Do you need assistance with getting out of bed/getting out of a chair/moving?: No Do you have difficulty managing or taking your medications?: No  Follow up appointments reviewed: PCP Follow-up appointment confirmed?: Yes Date of PCP follow-up appointment?: 09/17/23 Follow-up Provider: Janee Morn Do you need transportation to your follow-up appointment?: No Do you understand care options if your condition(s) worsen?: Yes-patient verbalized understanding    SIGNATURE Arvil Persons, BSN, RN

## 2023-09-17 ENCOUNTER — Ambulatory Visit: Payer: BC Managed Care – PPO | Admitting: Family Medicine

## 2023-09-17 ENCOUNTER — Encounter: Payer: Self-pay | Admitting: Family Medicine

## 2023-09-17 VITALS — BP 110/78 | HR 75 | Temp 96.5°F | Wt 194.0 lb

## 2023-09-17 DIAGNOSIS — R1011 Right upper quadrant pain: Secondary | ICD-10-CM

## 2023-09-17 DIAGNOSIS — Z931 Gastrostomy status: Secondary | ICD-10-CM | POA: Diagnosis not present

## 2023-09-17 DIAGNOSIS — K3184 Gastroparesis: Secondary | ICD-10-CM | POA: Diagnosis not present

## 2023-09-17 DIAGNOSIS — R112 Nausea with vomiting, unspecified: Secondary | ICD-10-CM | POA: Diagnosis not present

## 2023-09-17 DIAGNOSIS — K521 Toxic gastroenteritis and colitis: Secondary | ICD-10-CM

## 2023-09-17 LAB — CBC WITH DIFFERENTIAL/PLATELET
Basophils Absolute: 0.1 10*3/uL (ref 0.0–0.1)
Basophils Relative: 0.7 % (ref 0.0–3.0)
Eosinophils Absolute: 0.1 10*3/uL (ref 0.0–0.7)
Eosinophils Relative: 2 % (ref 0.0–5.0)
HCT: 38.4 % (ref 36.0–46.0)
Hemoglobin: 12.5 g/dL (ref 12.0–15.0)
Lymphocytes Relative: 24.7 % (ref 12.0–46.0)
Lymphs Abs: 1.9 10*3/uL (ref 0.7–4.0)
MCHC: 32.6 g/dL (ref 30.0–36.0)
MCV: 91.6 fL (ref 78.0–100.0)
Monocytes Absolute: 0.6 10*3/uL (ref 0.1–1.0)
Monocytes Relative: 7.5 % (ref 3.0–12.0)
Neutro Abs: 4.9 10*3/uL (ref 1.4–7.7)
Neutrophils Relative %: 65.1 % (ref 43.0–77.0)
Platelets: 263 10*3/uL (ref 150.0–400.0)
RBC: 4.19 Mil/uL (ref 3.87–5.11)
RDW: 14.1 % (ref 11.5–15.5)
WBC: 7.5 10*3/uL (ref 4.0–10.5)

## 2023-09-17 LAB — COMPREHENSIVE METABOLIC PANEL
ALT: 21 U/L (ref 0–35)
AST: 16 U/L (ref 0–37)
Albumin: 4.5 g/dL (ref 3.5–5.2)
Alkaline Phosphatase: 97 U/L (ref 39–117)
BUN: 14 mg/dL (ref 6–23)
CO2: 25 meq/L (ref 19–32)
Calcium: 9.5 mg/dL (ref 8.4–10.5)
Chloride: 102 meq/L (ref 96–112)
Creatinine, Ser: 0.66 mg/dL (ref 0.40–1.20)
GFR: 110.13 mL/min (ref >60.00)
Glucose, Bld: 78 mg/dL (ref 70–99)
Potassium: 4.2 meq/L (ref 3.5–5.1)
Sodium: 138 meq/L (ref 135–145)
Total Bilirubin: 0.3 mg/dL (ref 0.2–1.2)
Total Protein: 7.7 g/dL (ref 6.0–8.3)

## 2023-09-17 MED ORDER — TRAMADOL HCL 50 MG PO TABS: 50.0000 mg | ORAL_TABLET | Freq: Four times a day (QID) | ORAL | 0 refills | Status: AC | PRN

## 2023-09-17 NOTE — Progress Notes (Unsigned)
Assessment/Plan:   Problem List Items Addressed This Visit   None   There are no discontinued medications.  No follow-ups on file.    Subjective:   Encounter date: 09/17/2023  Kaitlyn Johnson is a 42 y.o. 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; Palpitations; Abnormal LFTs; Encounter to establish care with new doctor; Low back pain; Intractable nausea and vomiting; Anxiety; Allergic reaction to bee sting; Arthropathy of lumbar facet joint; Bertolotti's syndrome; Chronic right shoulder pain; Gastroparesis; History of pancreatitis; History of hysterectomy; Mild intermittent asthma; Iron deficiency anemia; Insomnia; History of shingles; Hypocalcemia; Pre-op exam; History of Cushing's syndrome; Wrongful diagnosis of von Willebrand's disease; Acute non-recurrent maxillary sinusitis; Intractable nausea; S/P lumbar fusion; LUQ abdominal pain; and Nausea and vomiting on their problem list..   She  has a past medical history of Acute appendicitis (02/28/2017), Acute pancreatitis (11/30/2013), Anemia (11/26/2011), Anxiety, Asthma, Celiac and mesenteric artery injury, Cushing's syndrome (HCC), Depression, H/O hiatal hernia, History of kidney stones, Iatrogenic Cushing's syndrome (HCC) (10/02/2013), Nausea vomiting and diarrhea (11/23/2013), Nausea with vomiting (11/30/2013), Palpitations (12/05/2018), Pancreatitis (11/29/2013), POTS (postural orthostatic tachycardia syndrome), Shortness of breath dyspnea, and Sphincter of Oddi dysfunction..   She presents with chief complaint of Hospitalization Follow-up (Still having right upper quad pain. GI doc requesting liver enzymes lab work ) .   HPI:   ROS  Past Surgical History:  Procedure Laterality Date    ABDOMINAL HYSTERECTOMY  08/2010   ANTERIOR CERVICAL DECOMP/DISCECTOMY FUSION N/A 03/31/2013   Procedure: ANTERIOR CERVICAL DECOMPRESSION/DISCECTOMY FUSION 1 LEVEL Cervical five-six;  Surgeon: Reinaldo Meeker, MD;  Location: [facility];  Service: Neurosurgery;  Laterality: N/A;   APPENDECTOMY     BACK SURGERY     Cholangio-Pancreatography with Spintectorotomy + Stent  07/16/2012   01/15/14   CHOLECYSTECTOMY     ESOPHAGOGASTRODUODENOSCOPY (EGD) WITH PROPOFOL N/A 08/19/2018   Procedure: ESOPHAGOGASTRODUODENOSCOPY (EGD) WITH PROPOFOL;  Surgeon: Charlott Rakes, MD;  Location: [facility];  Service: Endoscopy;  Laterality: N/A;   LAPAROSCOPIC APPENDECTOMY N/A 02/28/2017   Procedure: APPENDECTOMY LAPAROSCOPIC;  Surgeon: Harriette Bouillon, MD;  Location: [facility];  Service: General;  Laterality: N/A;   LAPAROSCOPIC ENDOMETRIOSIS FULGURATION     LAPAROSCOPIC GASTRIC SLEEVE RESECTION N/A 07/15/2018   Procedure: LAPAROSCOPIC GASTRIC SLEEVE RESECTION, UPPER ENDO, ERAS Pathway;  Surgeon: Luretha Denno, MD;  Location: [facility];  Service: General;  Laterality: N/A;   LUMBAR LAMINECTOMY     LUMBAR LAMINECTOMY/DECOMPRESSION MICRODISCECTOMY Left 06/22/2016   Procedure: MICRODISCECTOMY LEFT LUMBAR FOUR-FIVE;  Surgeon: Lisbeth Renshaw, MD;  Location: [facility];  Service: Neurosurgery;  Laterality: Left;   ROUX-EN-Y PROCEDURE  04/1999    Outpatient Medications Prior to Visit  Medication Sig Dispense Refill   acebutolol (SECTRAL) 200 MG capsule Take 1 capsule (200 mg total) by mouth 2 (two) times daily. 180 capsule 3   albuterol (VENTOLIN HFA) 108 (90 Base) MCG/ACT inhaler Inhale 1 puff into the lungs as needed for wheezing or shortness of breath.     AMBIEN CR 12.5 MG CR tablet Take 12.5 mg by mouth at bedtime.  1   ARIPiprazole ER (ABILIFY MAINTENA) 400 MG PRSY prefilled syringe Inject 400 mg into the muscle every 28 (twenty-eight) days. 3 each 1   busPIRone (BUSPAR) 10 MG tablet Take 20 mg by mouth  3 (three) times daily.     clonazePAM (KLONOPIN)  0.5 MG tablet Take 0.5 mg by mouth 2 (two) times daily as needed for anxiety.     cyanocobalamin (VITAMIN B12) 1000 MCG/ML injection Inject 1,000 mcg into the skin every 30 (thirty) days.     desipramine (NORPRAMIN) 25 MG tablet Take 25 mg by mouth at bedtime.     DEXILANT 30 MG capsule Take 30 mg by mouth 2 (two) times daily.     dicyclomine (BENTYL) 10 MG capsule Take 1 capsule (10 mg total) by mouth 3 (three) times daily as needed for spasms (Pain/ spasms).     diphenoxylate-atropine (LOMOTIL) 2.5-0.025 MG tablet Take 1 tablet by mouth 4 (four) times daily as needed for diarrhea or loose stools.     EPINEPHrine 0.3 mg/0.3 mL IJ SOAJ injection Inject 0.3 mg into the muscle as needed for anaphylaxis. 1 each 3   ipratropium (ATROVENT) 0.03 % nasal spray Place 2 sprays into both nostrils every 12 (twelve) hours. 30 mL 12   ivabradine (CORLANOR) 7.5 MG TABS tablet Take 1 tablet (7.5 mg total) by mouth 2 (two) times daily with a meal. 180 tablet 3   lamoTRIgine (LAMICTAL) 100 MG tablet Take 100 mg by mouth at bedtime.     levalbuterol (XOPENEX HFA) 45 MCG/ACT inhaler Inhale 1 puff into the lungs as needed for wheezing or shortness of breath.     levalbuterol (XOPENEX) 0.63 MG/3ML nebulizer solution Take 0.63 mg by nebulization every 6 (six) hours as needed for wheezing or shortness of breath.     montelukast (SINGULAIR) 10 MG tablet Take 1 tablet (10 mg total) by mouth at bedtime. 30 tablet 3   ondansetron (ZOFRAN-ODT) 4 MG disintegrating tablet Take 1 tablet (4 mg total) by mouth every 8 (eight) hours as needed for nausea or vomiting. 20 tablet 0   traMADol (ULTRAM) 50 MG tablet Take 50 mg by mouth every 6 (six) hours as needed.     traZODone (DESYREL) 100 MG tablet Take 100-300 mg by mouth at bedtime as needed for sleep.     ABILIFY MAINTENA 400 MG SRER injection Inject 400 mg into the muscle every 28 (twenty-eight) days. (Patient not taking: Reported  on 09/17/2023)     acetaminophen (TYLENOL) 500 MG tablet Take 2 tablets (1,000 mg total) by mouth every 8 (eight) hours. (Patient not taking: Reported on 07/16/2023) 30 tablet 0   No facility-administered medications prior to visit.    Family History  Problem Relation Age of Onset   Hypertension Mother     Social History   Socioeconomic History   Marital status: Married    Spouse name: Not on file   Number of children: 2   Years of education: Not on file   Highest education level: Not on file  Occupational History   Not on file  Tobacco Use   Smoking status: Never    Passive exposure: Never   Smokeless tobacco: Never  Vaping Use   Vaping status: Never Used  Substance and Sexual Activity   Alcohol use: Yes    Alcohol/week: 5.0 standard drinks of alcohol    Types: 5 Glasses of wine per week    Comment: OCC   Drug use: No   Sexual activity: Yes    Partners: Male    Birth control/protection: Surgical  Other Topics Concern   Not on file  Social History Narrative   Regular exercise: can't at this time   Caffeine use: no   Social Determinants of Corporate investment banker Strain: Low  Risk  (09/13/2023)   Received from [facility]   Overall Financial Resource Strain (CARDIA)    Difficulty of Paying Living Expenses: Not hard at all  Food Insecurity: No Food Insecurity (09/13/2023)   Received from [facility]   Hunger Vital Sign    Worried About Running Out of Food in the Last Year: Never true    Ran Out of Food in the Last Year: Never true  Transportation Needs: No Transportation Needs (09/13/2023)   Received from [facility] - Transportation    In the past 12 months, has lack of transportation kept you from medical appointments or from getting medications?: No    Lack of Transportation (Non-Medical): No  Physical Activity: Not on file  Stress: Not on file  Social Connections: Not on file  Intimate  Partner Violence: Not At Risk (07/17/2023)   Humiliation, Afraid, Rape, and Kick questionnaire    Fear of Current or Ex-Partner: No    Emotionally Abused: No    Physically Abused: No    Sexually Abused: No                                                                                                  Objective:  Physical Exam: BP 110/78 (BP Location: Left Arm, Patient Position: Sitting, Cuff Size: Large)   Pulse 75   Temp (!) 96.5 F (35.8 C) (Temporal)   Wt 194 lb (88 kg)   SpO2 99%   BMI 28.65 kg/m   Wt Readings from Last 3 Encounters:  09/17/23 194 lb (88 kg)  07/16/23 203 lb 0.7 oz (92.1 kg)  04/19/23 200 lb (90.7 kg)     Physical Exam  CT Angio Abd/Pel w/ and/or w/o  Result Date: 07/23/2023 CLINICAL DATA:  Abdominal pain, nausea and vomiting. 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 History of poor gastric functioning. EXAM: CT ANGIOGRAPHY ABDOMEN AND PELVIS WITH CONTRAST AND WITHOUT CONTRAST TECHNIQUE: Multidetector CT imaging of the abdomen and pelvis was performed using the standard protocol during bolus administration of intravenous contrast. Multiplanar reconstructed images and MIPs were obtained and reviewed to evaluate the vascular anatomy. RADIATION DOSE REDUCTION: This exam was performed according to the departmental dose-optimization program which includes automated exposure control, adjustment of the mA and/or kV according to patient size and/or use of iterative reconstruction technique. CONTRAST:  OMNIPAQUE IOHEXOL 350 MG/ML SOLN COMPARISON:  Multiple prior studies with the most recent CT of the abdomen and pelvis on 07/16/2023. FINDINGS: VASCULAR Aorta: Normal abdominal aorta demonstrating normal caliber without evidence of atherosclerosis, aneurysm or dissection. No findings to suggest vasculitis. Celiac: Normally patent and normally patent branch vessels and with normal branch vessel anatomy. Normally patent arterial supply to the stomach.  SMA: Normally patent. No findings currently by CTA to suggest SMA syndrome with normal distance present between the descending SMA trunk and aorta without significant compression of the transverse duodenum. Renals: Normally patent bilateral single renal arteries. IMA: Normally patent. Inflow: Normally patent bilateral iliac arteries. Proximal Outflow: Normally patent bilateral common femoral arteries  and femoral bifurcations. Veins: Venous phase imaging demonstrates normal patency of venous structures in the abdomen and pelvis including mesenteric veins, portal vein and splenic vein. The IVC, renal veins, iliac veins and common femoral veins demonstrate normal patency. Review of the MIP images confirms the above findings. NON-VASCULAR Lower chest: No acute abnormality. Hepatobiliary: No focal liver abnormality is seen. Status post cholecystectomy. No biliary dilatation. Pancreas: Unremarkable. No pancreatic ductal dilatation or surrounding inflammatory changes. Spleen: Normal in size without focal abnormality. Adrenals/Urinary Tract: Adrenal glands are unremarkable. Kidneys are normal, without renal calculi, focal lesion, or hydronephrosis. Bladder is unremarkable. Stomach/Bowel: Status post prior gastric sleeve procedure with decompressed stomach and no evidence of complication. Bowel shows some probable mild ileus involving small bowel in the left upper quadrant demonstrating mild gaseous dilatation. No overt pattern of small-bowel obstruction is identified. The colon is decompressed. No free intraperitoneal air identified. Lymphatic: No enlarged abdominal or pelvic lymph nodes. Reproductive: Status post hysterectomy with residual left ovary visualized demonstrating small follicular cysts. The largest measures 13 mm and is likely physiologic. Other: No hernias, ascites or focal abscess identified. Musculoskeletal: No acute findings. Status post prior posterior lumbar fusion at the L5-S1 level. IMPRESSION: 1.  Normal CTA of the abdomen and pelvis. No evidence of SMA syndrome currently by CTA. 2. Status post prior gastric sleeve procedure with decompressed stomach and no evidence of complication. 3. Probable mild ileus involving small bowel in the left upper quadrant demonstrating mild gaseous dilatation. No overt pattern of small-bowel obstruction is identified. 4. Status post cholecystectomy and hysterectomy. 5. Status post prior posterior lumbar fusion at the L5-S1 level. Electronically Signed   By: Irish Lack M.D.   On: 07/23/2023 08:19   CT ABDOMEN PELVIS W CONTRAST  Result Date: 07/16/2023 CLINICAL DATA:  Worsening left upper quadrant pain and left back pain for approximately 1 week. Nausea and vomiting. EXAM: CT ABDOMEN AND PELVIS WITH CONTRAST TECHNIQUE: Multidetector CT imaging of the abdomen and pelvis was performed using the standard protocol following bolus administration of intravenous contrast. RADIATION DOSE REDUCTION: This exam was performed according to the departmental dose-optimization program which includes automated exposure control, adjustment of the mA and/or kV according to patient size and/or use of iterative reconstruction technique. CONTRAST:  80mL OMNIPAQUE IOHEXOL 300 MG/ML  SOLN COMPARISON:  01/02/2023 and 08/18/2018 FINDINGS: Lower Chest: No acute findings. Hepatobiliary: No suspicious hepatic masses identified. Prior cholecystectomy. No evidence of biliary obstruction. Pancreas:  No mass or inflammatory changes. Spleen: Within normal limits in size and appearance. Adrenals/Urinary Tract: No suspicious masses identified. No evidence of ureteral calculi or hydronephrosis. Stomach/Bowel: Prior sleeve gastrectomy noted. No evidence of obstruction, inflammatory process or abnormal fluid collections. Vascular/Lymphatic: No pathologically enlarged lymph nodes. No acute vascular findings. Reproductive: Prior hysterectomy. Adnexal regions are unremarkable. Other:  None. Musculoskeletal:  No  suspicious bone lesions identified. IMPRESSION: No acute findings or other significant abnormality. Electronically Signed   By: Danae Orleans M.D.   On: 07/16/2023 11:46    Recent Results (from the past 2160 hour(s))  Lipase, blood     Status: None   Collection Time: 07/16/23  9:09 AM  Result Value Ref Range   Lipase 29 11 - 51 U/L    Comment: Performed at [facility], [address], [place], [place] [address]  Comprehensive metabolic panel     Status: Abnormal   Collection Time: 07/16/23  9:09 AM  Result Value Ref Range   Sodium 139 135 - 145 mmol/L  Potassium 4.0 3.5 - 5.1 mmol/L   Chloride 104 98 - 111 mmol/L   CO2 29 22 - 32 mmol/L   Glucose, Bld 102 (H) 70 - 99 mg/dL    Comment: Glucose reference range applies only to samples taken after fasting for at least 8 hours.   BUN 10 6 - 20 mg/dL   Creatinine, Ser 4.78 0.44 - 1.00 mg/dL   Calcium 8.5 (L) 8.9 - 10.3 mg/dL   Total Protein 7.2 6.5 - 8.1 g/dL   Albumin 4.2 3.5 - 5.0 g/dL   AST 18 15 - 41 U/L   ALT 17 0 - 44 U/L   Alkaline Phosphatase 59 38 - 126 U/L   Total Bilirubin 0.3 0.3 - 1.2 mg/dL   GFR, Estimated >29 >56 mL/min    Comment: (NOTE) Calculated using the CKD-EPI Creatinine Equation (2021)    Anion gap 6 5 - 15    Comment: Performed at [facility], [address], [place], [place] [address]  CBC     Status: Abnormal   Collection Time: 07/16/23  9:09 AM  Result Value Ref Range   WBC 4.6 4.0 - 10.5 K/uL   RBC 3.98 3.87 - 5.11 MIL/uL   Hemoglobin 11.7 (L) 12.0 - 15.0 g/dL   HCT 65.7 (L) 84.6 - 96.2 %   MCV 90.2 80.0 - 100.0 fL   MCH 29.4 26.0 - 34.0 pg   MCHC 32.6 30.0 - 36.0 g/dL   RDW 95.2 84.1 - 32.4 %   Platelets 248 150 - 400 K/uL   nRBC 0.0 0.0 - 0.2 %    Comment: Performed at [facility], [address] [place], [place] [address]  Urinalysis, Routine w reflex microscopic -Urine, Clean Catch     Status: Abnormal   Collection Time: 07/16/23   9:59 AM  Result Value Ref Range   Color, Urine COLORLESS (A) YELLOW   APPearance CLEAR CLEAR   Specific Gravity, Urine 1.011 1.005 - 1.030   pH 6.5 5.0 - 8.0   Glucose, UA NEGATIVE NEGATIVE mg/dL   Hgb urine dipstick NEGATIVE NEGATIVE   Bilirubin Urine NEGATIVE NEGATIVE   Ketones, ur NEGATIVE NEGATIVE mg/dL   Protein, ur NEGATIVE NEGATIVE mg/dL   Nitrite NEGATIVE NEGATIVE   Leukocytes,Ua NEGATIVE NEGATIVE    Comment: Performed at [facility], [address], [place], [place] [address]  Comprehensive metabolic panel     Status: Abnormal   Collection Time: 07/17/23  3:42 AM  Result Value Ref Range   Sodium 137 135 - 145 mmol/L   Potassium 3.4 (L) 3.5 - 5.1 mmol/L   Chloride 105 98 - 111 mmol/L   CO2 25 22 - 32 mmol/L   Glucose, Bld 94 70 - 99 mg/dL    Comment: Glucose reference range applies only to samples taken after fasting for at least 8 hours.   BUN 8 6 - 20 mg/dL   Creatinine, Ser 6.44 0.44 - 1.00 mg/dL   Calcium 7.5 (L) 8.9 - 10.3 mg/dL   Total Protein 5.8 (L) 6.5 - 8.1 g/dL   Albumin 3.3 (L) 3.5 - 5.0 g/dL   AST 18 15 - 41 U/L   ALT 19 0 - 44 U/L   Alkaline Phosphatase 47 38 - 126 U/L   Total Bilirubin 0.3 0.3 - 1.2 mg/dL   GFR, Estimated >03 >47 mL/min    Comment: (NOTE) Calculated using the CKD-EPI Creatinine Equation (2021)    Anion gap 7 5 - 15  Comment: Performed at [facility], [address] [place], [place] [address]  CBC     Status: Abnormal   Collection Time: 07/17/23  3:42 AM  Result Value Ref Range   WBC 4.6 4.0 - 10.5 K/uL   RBC 3.53 (L) 3.87 - 5.11 MIL/uL   Hemoglobin 10.4 (L) 12.0 - 15.0 g/dL   HCT 01.0 (L) 27.2 - 53.6 %   MCV 94.3 80.0 - 100.0 fL   MCH 29.5 26.0 - 34.0 pg   MCHC 31.2 30.0 - 36.0 g/dL   RDW 64.4 03.4 - 74.2 %   Platelets 196 150 - 400 K/uL   nRBC 0.0 0.0 - 0.2 %    Comment: Performed at [facility], [address], [place], [place] [address]  Magnesium     Status:  None   Collection Time: 07/17/23  3:42 AM  Result Value Ref Range   Magnesium 2.0 1.7 - 2.4 mg/dL    Comment: Performed at [facility], [address], [place], [place] [address]  Lipase, blood     Status: None   Collection Time: 07/17/23  1:42 PM  Result Value Ref Range   Lipase 32 11 - 51 U/L    Comment: Performed at [facility], [address] [place], [place] [address]  Basic metabolic panel     Status: Abnormal   Collection Time: 07/18/23  3:52 AM  Result Value Ref Range   Sodium 138 135 - 145 mmol/L   Potassium 3.6 3.5 - 5.1 mmol/L   Chloride 106 98 - 111 mmol/L   CO2 23 22 - 32 mmol/L   Glucose, Bld 98 70 - 99 mg/dL    Comment: Glucose reference range applies only to samples taken after fasting for at least 8 hours.   BUN 5 (L) 6 - 20 mg/dL   Creatinine, Ser 1.88 0.44 - 1.00 mg/dL   Calcium 7.7 (L) 8.9 - 10.3 mg/dL   GFR, Estimated >41 >66 mL/min    Comment: (NOTE) Calculated using the CKD-EPI Creatinine Equation (2021)    Anion gap 9 5 - 15    Comment: Performed at [facility], [address], [place], [place] [address]  Glucose, capillary     Status: Abnormal   Collection Time: 07/20/23 11:32 PM  Result Value Ref Range   Glucose-Capillary 109 (H) 70 - 99 mg/dL    Comment: Glucose reference range applies only to samples taken after fasting for at least 8 hours.  Basic metabolic panel     Status: Abnormal   Collection Time: 07/21/23  4:11 AM  Result Value Ref Range   Sodium 137 135 - 145 mmol/L   Potassium 3.8 3.5 - 5.1 mmol/L   Chloride 107 98 - 111 mmol/L   CO2 22 22 - 32 mmol/L   Glucose, Bld 102 (H) 70 - 99 mg/dL    Comment: Glucose reference range applies only to samples taken after fasting for at least 8 hours.   BUN <5 (L) 6 - 20 mg/dL   Creatinine, Ser 6.01 0.44 - 1.00 mg/dL   Calcium 8.0 (L) 8.9 - 10.3 mg/dL   GFR, Estimated >09 >32 mL/min    Comment: (NOTE) Calculated using the CKD-EPI  Creatinine Equation (2021)    Anion gap 8 5 - 15    Comment: Performed at [facility], [address], [place], [place] [address]  Gastrointestinal Panel by PCR , Stool  Status: None   Collection Time: 07/21/23  3:25 PM   Specimen: Stool  Result Value Ref Range   Campylobacter species NOT DETECTED NOT DETECTED   Plesimonas shigelloides NOT DETECTED NOT DETECTED   Salmonella species NOT DETECTED NOT DETECTED   Yersinia enterocolitica NOT DETECTED NOT DETECTED   Vibrio species NOT DETECTED NOT DETECTED   Vibrio cholerae NOT DETECTED NOT DETECTED   Enteroaggregative E coli (EAEC) NOT DETECTED NOT DETECTED   Enteropathogenic E coli (EPEC) NOT DETECTED NOT DETECTED   Enterotoxigenic E coli (ETEC) NOT DETECTED NOT DETECTED   Shiga like toxin producing E coli (STEC) NOT DETECTED NOT DETECTED   Shigella/Enteroinvasive E coli (EIEC) NOT DETECTED NOT DETECTED   Cryptosporidium NOT DETECTED NOT DETECTED   Cyclospora cayetanensis NOT DETECTED NOT DETECTED   Entamoeba histolytica NOT DETECTED NOT DETECTED   Giardia lamblia NOT DETECTED NOT DETECTED   Adenovirus F40/41 NOT DETECTED NOT DETECTED   Astrovirus NOT DETECTED NOT DETECTED   Norovirus GI/GII NOT DETECTED NOT DETECTED   Rotavirus A NOT DETECTED NOT DETECTED   Sapovirus (I, II, IV, and V) NOT DETECTED NOT DETECTED    Comment: Performed at [facility], [address] [place], [place] [address]  Comprehensive metabolic panel     Status: Abnormal   Collection Time: 07/22/23  3:42 AM  Result Value Ref Range   Sodium 137 135 - 145 mmol/L   Potassium 3.5 3.5 - 5.1 mmol/L   Chloride 107 98 - 111 mmol/L   CO2 24 22 - 32 mmol/L   Glucose, Bld 101 (H) 70 - 99 mg/dL    Comment: Glucose reference range applies only to samples taken after fasting for at least 8 hours.   BUN <5 (L) 6 - 20 mg/dL   Creatinine, Ser 5.28 0.44 - 1.00 mg/dL   Calcium 8.0 (L) 8.9 - 10.3 mg/dL   Total Protein 6.4 (L) 6.5 - 8.1 g/dL    Albumin 3.3 (L) 3.5 - 5.0 g/dL   AST 40 15 - 41 U/L   ALT 37 0 - 44 U/L   Alkaline Phosphatase 65 38 - 126 U/L   Total Bilirubin 0.4 0.3 - 1.2 mg/dL   GFR, Estimated >41 >32 mL/min    Comment: (NOTE) Calculated using the CKD-EPI Creatinine Equation (2021)    Anion gap 6 5 - 15    Comment: Performed at [facility], [address] [place], [place] [address]  CBC with Differential/Platelet     Status: Abnormal   Collection Time: 07/22/23  3:42 AM  Result Value Ref Range   WBC 5.1 4.0 - 10.5 K/uL   RBC 3.66 (L) 3.87 - 5.11 MIL/uL   Hemoglobin 10.6 (L) 12.0 - 15.0 g/dL   HCT 27.2 (L) 53.6 - 64.4 %   MCV 94.0 80.0 - 100.0 fL   MCH 29.0 26.0 - 34.0 pg   MCHC 30.8 30.0 - 36.0 g/dL   RDW 03.4 74.2 - 59.5 %   Platelets 197 150 - 400 K/uL   nRBC 0.0 0.0 - 0.2 %   Neutrophils Relative % 53 %   Neutro Abs 2.7 1.7 - 7.7 K/uL   Lymphocytes Relative 33 %   Lymphs Abs 1.7 0.7 - 4.0 K/uL   Monocytes Relative 10 %   Monocytes Absolute 0.5 0.1 - 1.0 K/uL   Eosinophils Relative 4 %   Eosinophils Absolute 0.2 0.0 - 0.5 K/uL   Basophils Relative 0 %   Basophils Absolute 0.0 0.0 -  0.1 K/uL   Immature Granulocytes 0 %   Abs Immature Granulocytes 0.02 0.00 - 0.07 K/uL    Comment: Performed at [facility], [address], [place], [place] [address]  Lipase, blood     Status: None   Collection Time: 07/22/23  3:42 AM  Result Value Ref Range   Lipase 22 11 - 51 U/L    Comment: Performed at [facility], [address] [place], [place] [address]  CBC     Status: Abnormal   Collection Time: 07/23/23  3:52 AM  Result Value Ref Range   WBC 4.6 4.0 - 10.5 K/uL   RBC 3.91 3.87 - 5.11 MIL/uL   Hemoglobin 11.4 (L) 12.0 - 15.0 g/dL   HCT 09.8 11.9 - 14.7 %   MCV 93.1 80.0 - 100.0 fL   MCH 29.2 26.0 - 34.0 pg   MCHC 31.3 30.0 - 36.0 g/dL   RDW 82.9 56.2 - 13.0 %   Platelets 215 150 - 400 K/uL   nRBC 0.0 0.0 - 0.2 %    Comment: Performed at  [facility], [address], [place], [place] [address]  Basic metabolic panel     Status: Abnormal   Collection Time: 07/23/23  3:52 AM  Result Value Ref Range   Sodium 138 135 - 145 mmol/L   Potassium 3.7 3.5 - 5.1 mmol/L   Chloride 107 98 - 111 mmol/L   CO2 23 22 - 32 mmol/L   Glucose, Bld 102 (H) 70 - 99 mg/dL    Comment: Glucose reference range applies only to samples taken after fasting for at least 8 hours.   BUN <5 (L) 6 - 20 mg/dL   Creatinine, Ser 4.69 0.44 - 1.00 mg/dL   Calcium 8.4 (L) 8.9 - 10.3 mg/dL   GFR, Estimated >62 >95 mL/min    Comment: (NOTE) Calculated using the CKD-EPI Creatinine Equation (2021)    Anion gap 8 5 - 15    Comment: Performed at [facility], [address] [place], [place] [address]  Basic metabolic panel     Status: Abnormal   Collection Time: 07/24/23  7:56 AM  Result Value Ref Range   Sodium 137 135 - 145 mmol/L   Potassium 3.8 3.5 - 5.1 mmol/L   Chloride 103 98 - 111 mmol/L   CO2 24 22 - 32 mmol/L   Glucose, Bld 95 70 - 99 mg/dL    Comment: Glucose reference range applies only to samples taken after fasting for at least 8 hours.   BUN 5 (L) 6 - 20 mg/dL   Creatinine, Ser 2.44 0.44 - 1.00 mg/dL   Calcium 8.7 (L) 8.9 - 10.3 mg/dL   GFR, Estimated >01 >02 mL/min    Comment: (NOTE) Calculated using the CKD-EPI Creatinine Equation (2021)    Anion gap 10 5 - 15    Comment: Performed at [facility], [address] [place], [place] [address]        Garner Nash, MD, MS

## 2023-09-18 ENCOUNTER — Other Ambulatory Visit: Payer: Self-pay | Admitting: Obstetrics and Gynecology

## 2023-09-18 ENCOUNTER — Encounter: Payer: Self-pay | Admitting: Family Medicine

## 2023-09-18 DIAGNOSIS — Z1231 Encounter for screening mammogram for malignant neoplasm of breast: Secondary | ICD-10-CM

## 2023-09-18 LAB — URINALYSIS, ROUTINE W REFLEX MICROSCOPIC
Bilirubin Urine: NEGATIVE
Hgb urine dipstick: NEGATIVE
Leukocytes,Ua: NEGATIVE
Nitrite: NEGATIVE
RBC / HPF: NONE SEEN (ref 0–?)
Specific Gravity, Urine: >=1.03 — AB (ref 1.000–1.030)
Urine Glucose: NEGATIVE
Urobilinogen, UA: 1 (ref 0.0–1.0)
pH: 6 (ref 5.0–8.0)

## 2023-09-19 DIAGNOSIS — F605 Obsessive-compulsive personality disorder: Secondary | ICD-10-CM | POA: Diagnosis not present

## 2023-09-19 DIAGNOSIS — K529 Noninfective gastroenteritis and colitis, unspecified: Secondary | ICD-10-CM | POA: Insufficient documentation

## 2023-09-19 DIAGNOSIS — R1110 Vomiting, unspecified: Secondary | ICD-10-CM | POA: Diagnosis not present

## 2023-09-19 DIAGNOSIS — F331 Major depressive disorder, recurrent, moderate: Secondary | ICD-10-CM | POA: Diagnosis not present

## 2023-09-19 DIAGNOSIS — Z931 Gastrostomy status: Secondary | ICD-10-CM | POA: Insufficient documentation

## 2023-09-19 DIAGNOSIS — R1011 Right upper quadrant pain: Secondary | ICD-10-CM | POA: Insufficient documentation

## 2023-09-19 DIAGNOSIS — F411 Generalized anxiety disorder: Secondary | ICD-10-CM | POA: Diagnosis not present

## 2023-09-19 DIAGNOSIS — R634 Abnormal weight loss: Secondary | ICD-10-CM | POA: Diagnosis not present

## 2023-09-19 DIAGNOSIS — K521 Toxic gastroenteritis and colitis: Secondary | ICD-10-CM | POA: Insufficient documentation

## 2023-09-19 NOTE — Assessment & Plan Note (Signed)
History of complex GI medical history status post multiple GI surgeries including recent GJ tube placement and migratory complications.  Presents with persistent right upper quadrant abdominal pain   Differential Diagnosis:  GJ tube-related complications (migration, erosion, ulceration) Abdominal adhesions Intra-abdominal infection Pancreatitis recurrence Refractory gastroparesis IBS Other hepatic dysfunction  Plan: Complete metabolic panel to assess liver and electrolyte/nutrition CBC to assess for possible infection Prescribe Tramadol 50?mg orally every 6 hours as needed for pain relief for 7-day course Monitor for signs of GJ tube complications, including bleeding or perforation. Advise patient to seek immediate care if severe pain or signs of gastrointestinal bleeding occur. Consider further imaging studies if pain persists or worsens. Follow up with [facility] gastrointestinal specialist as scheduled. Return to clinic or emergency department if symptoms worsen.

## 2023-09-19 NOTE — Assessment & Plan Note (Signed)
Persistent, but tolerable nausea managed effectively with Zofran. Concern about use of olanzapine for nausea due to metabolic side effect profile.  Plan:  Continue Zofran as needed for nausea. Patient to discuss potential olanzapine use in conjunction with Abilify with psychiatrist and gastroenterologist to address nausea and mood management. Monitor for potential side effects of medications.

## 2023-09-19 NOTE — Assessment & Plan Note (Signed)
Known gastroparesis contributing to ongoing gastrointestinal symptoms; managed with GJ tube feeds.  Plan:  Continue overnight enteral GJ tube feeds via J port for 10 hours nightly to ensure adequate hydration and nutrition  Monitor for signs of tube dysfunction or complications. Follow up with nutritionist and gastrointestinal specialist for tube management and adjustments.

## 2023-09-19 NOTE — Assessment & Plan Note (Signed)
Frequent diarrhea likely secondary to Motegrity use. Complicated by complex GI health.   Plan: Follow-up with gastroenterology to discuss Motegrity versus Lomotil dosing to avoid conflicting effects on GI motility

## 2023-09-26 ENCOUNTER — Encounter: Payer: Self-pay | Admitting: Cardiology

## 2023-10-01 ENCOUNTER — Encounter: Payer: Self-pay | Admitting: Family Medicine

## 2023-10-01 ENCOUNTER — Ambulatory Visit (INDEPENDENT_AMBULATORY_CARE_PROVIDER_SITE_OTHER): Payer: BC Managed Care – PPO | Admitting: Family Medicine

## 2023-10-01 VITALS — BP 118/74 | HR 76 | Temp 97.2°F | Wt 201.2 lb

## 2023-10-01 DIAGNOSIS — K3184 Gastroparesis: Secondary | ICD-10-CM | POA: Diagnosis not present

## 2023-10-01 DIAGNOSIS — K521 Toxic gastroenteritis and colitis: Secondary | ICD-10-CM

## 2023-10-01 DIAGNOSIS — K529 Noninfective gastroenteritis and colitis, unspecified: Secondary | ICD-10-CM

## 2023-10-01 DIAGNOSIS — Z5181 Encounter for therapeutic drug level monitoring: Secondary | ICD-10-CM

## 2023-10-01 DIAGNOSIS — G8929 Other chronic pain: Secondary | ICD-10-CM | POA: Insufficient documentation

## 2023-10-01 DIAGNOSIS — R109 Unspecified abdominal pain: Secondary | ICD-10-CM

## 2023-10-01 MED ORDER — ONDANSETRON 4 MG PO TBDP: 4.0000 mg | ORAL_TABLET | Freq: Three times a day (TID) | ORAL | 0 refills | Status: DC | PRN

## 2023-10-01 MED ORDER — TRAMADOL HCL 50 MG PO TABS: 50.0000 mg | ORAL_TABLET | Freq: Four times a day (QID) | ORAL | 0 refills | Status: AC

## 2023-10-01 NOTE — Assessment & Plan Note (Signed)
Worsening nausea despite current medication regimen.  Plan:  Continue Zofran ODT every 6 hours as needed for nausea; refill prescription as requested. Patient and psychiatrist decided not to proceed with olanzapine (Zyprexa). Patient to follow up with Dr. Jeani Sow for further evaluation of nausea and possible treatment options. Monitor for any changes in symptoms.

## 2023-10-01 NOTE — Progress Notes (Signed)
Assessment/Plan:   Problem List Items Addressed This Visit       Digestive   Gastroparesis    Worsening nausea despite current medication regimen.  Plan:  Continue Zofran ODT every 6 hours as needed for nausea; refill prescription as requested. Patient and psychiatrist decided not to proceed with olanzapine (Zyprexa). Patient to follow up with Dr. Jeani Sow for further evaluation of nausea and possible treatment options. Monitor for any changes in symptoms.      Relevant Medications   ondansetron (ZOFRAN-ODT) 4 MG disintegrating tablet   Chronic diarrhea    Ongoing; experiencing frequent bowel movements despite interventions.  Plan:  Discontinued Motegrity  Continue Lomotil as needed for diarrhea. Patient to meet with nutritionist to assess and change enteral formula. Monitor bowel movement frequency and consistency.       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 Improved with tramadol use. Patient requesting refill.  Plan: Continue Tramadol 50?mg orally every 6 hours as needed for pain relief; refill prescription.  Obtain urine drug screen and have patient sign opioid agreement per chronic opioid use policy. Monitor for changes in pain pattern or severity. Patient to follow up with Dr. Jeani Sow, specialist in gastric emptying dysfunction, for further evaluation. Proceed with scheduled meeting with nutritionist to assess for feed intolerance and adjust enteral formula as needed. Return to clinic if pain worsens or new symptoms develop.      Relevant Medications   traMADol (ULTRAM) 50 MG tablet   Other Visit Diagnoses     Encounter for therapeutic drug monitoring       Relevant Orders   DRUG TEST, GENERAL TOXICOLOGY, URINE       Medications Discontinued During This Encounter  Medication Reason   ondansetron (ZOFRAN-ODT) 4 MG disintegrating tablet Reorder    Return if symptoms worsen or fail to  improve.    Subjective:   Encounter date: 10/01/2023  Manha Velten is a 42 y.o. 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; Palpitations; Abnormal LFTs; Encounter to establish care with new doctor; Low back pain; Intractable nausea and vomiting; Anxiety; Allergic reaction to bee sting; Arthropathy of lumbar facet joint; Bertolotti's syndrome; Chronic right shoulder pain; Gastroparesis; History of pancreatitis; History of hysterectomy; Mild intermittent asthma; Iron deficiency anemia; Insomnia; History of shingles; Hypocalcemia; Pre-op exam; History of Cushing's syndrome; Wrongful diagnosis of von Willebrand's disease; Acute non-recurrent maxillary sinusitis; S/P lumbar fusion; LUQ abdominal pain; Nausea and vomiting; RUQ abdominal pain; Gastrojejunal (GJ) tube in place [facility]); Chronic diarrhea; 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   She  has a past medical history of Acute appendicitis (02/28/2017), Acute pancreatitis (11/30/2013), Anemia (11/26/2011), Anxiety, Asthma, Celiac and mesenteric artery injury, Cushing's syndrome (HCC), Depression, H/O hiatal hernia, History of kidney stones, Iatrogenic Cushing's syndrome (HCC) (10/02/2013), Nausea vomiting and diarrhea (11/23/2013), Nausea with vomiting (11/30/2013), Palpitations (12/05/2018), Pancreatitis (11/29/2013), POTS (postural orthostatic tachycardia syndrome), Shortness of breath dyspnea, and Sphincter of Oddi dysfunction..   Chief Complaint: Diffuse abdominal pain and increased nausea.  History of Present Illness:  Patient with a history of gastroparesis and prior pancreatitis presents with persistent abdominal pain, now diffuse throughout the abdomen. The pain  level remains similar to previous visits. Reports significant increase in nausea over the past  18-24 hours, requiring Zofran every 6 hours. Vomiting persists with oral intake. Stopped Motegrity altogether but continues to experience diarrhea, suggesting possible intolerance to enteral feeds; plans to meet with nutritionist to assess and change formula. Using Lomotil for diarrhea management. Stoma site appears fine with a small piece of granulation tissue noted; it is not bothersome and shows no signs of infection. Weight has increased, which is positive in terms of hydration and nutrition status.   Review of Systems:  Constitutional: Denies fever or chills. Gastrointestinal: Positive for diffuse abdominal pain, increased nausea over past 18-24 hours, vomiting with oral intake, and persistent diarrhea. No blood in stool. Dermatological: Stoma site appears fine; small granulation tissue noted; no drainage, pain, or warmth. Psychiatric: Mood is stable. All other systems: Negative.    Past Surgical History:  Procedure Laterality Date   ABDOMINAL HYSTERECTOMY  08/2010   ANTERIOR CERVICAL DECOMP/DISCECTOMY FUSION N/A 03/31/2013   Procedure: ANTERIOR CERVICAL DECOMPRESSION/DISCECTOMY FUSION 1 LEVEL Cervical five-six;  Surgeon: Reinaldo Meeker, MD;  Location: [facility];  Service: Neurosurgery;  Laterality: N/A;   APPENDECTOMY     BACK SURGERY     Cholangio-Pancreatography with Spintectorotomy + Stent  07/16/2012   01/15/14   CHOLECYSTECTOMY     ESOPHAGOGASTRODUODENOSCOPY (EGD) WITH PROPOFOL N/A 08/19/2018   Procedure: ESOPHAGOGASTRODUODENOSCOPY (EGD) WITH PROPOFOL;  Surgeon: Charlott Rakes, MD;  Location: [facility];  Service: Endoscopy;  Laterality: N/A;   LAPAROSCOPIC APPENDECTOMY N/A 02/28/2017   Procedure: APPENDECTOMY LAPAROSCOPIC;  Surgeon: Harriette Bouillon, MD;  Location: [facility];  Service: General;  Laterality: N/A;   LAPAROSCOPIC ENDOMETRIOSIS FULGURATION     LAPAROSCOPIC GASTRIC  SLEEVE RESECTION N/A 07/15/2018   Procedure: LAPAROSCOPIC GASTRIC SLEEVE RESECTION, UPPER ENDO, ERAS Pathway;  Surgeon: Luretha Violet, MD;  Location: [facility];  Service: General;  Laterality: N/A;   LUMBAR LAMINECTOMY     LUMBAR LAMINECTOMY/DECOMPRESSION MICRODISCECTOMY Left 06/22/2016   Procedure: MICRODISCECTOMY LEFT LUMBAR FOUR-FIVE;  Surgeon: Lisbeth Renshaw, MD;  Location: [facility];  Service: Neurosurgery;  Laterality: Left;   ROUX-EN-Y PROCEDURE  04/1999    Outpatient Medications Prior to Visit  Medication Sig Dispense Refill   acebutolol (SECTRAL) 200 MG capsule Take 1 capsule (200 mg total) by mouth 2 (two) times daily. 180 capsule 3   albuterol (VENTOLIN HFA) 108 (90 Base) MCG/ACT inhaler Inhale 1 puff into the lungs as needed for wheezing or shortness of breath.     AMBIEN CR 12.5 MG CR tablet Take 12.5 mg by mouth at bedtime.  1   ARIPiprazole ER (ABILIFY MAINTENA) 400 MG PRSY prefilled syringe Inject 400 mg into the muscle every 28 (twenty-eight) days. 3 each 1   busPIRone (BUSPAR) 10 MG tablet Take 20 mg by mouth 3 (three) times daily.     clonazePAM (KLONOPIN) 0.5 MG tablet Take 0.5 mg by mouth 2 (two) times daily as needed for anxiety.     cyanocobalamin (VITAMIN B12) 1000 MCG/ML injection Inject 1,000 mcg into the skin every 30 (thirty) days.     desipramine (NORPRAMIN) 25 MG tablet Take 25 mg by mouth at bedtime.     DEXILANT 30 MG capsule Take 30 mg by mouth 2 (two) times daily.     dicyclomine (BENTYL) 10 MG capsule Take 1 capsule (10 mg total) by mouth 3 (three) times daily as needed for spasms (Pain/ spasms).     diphenoxylate-atropine (LOMOTIL) 2.5-0.025 MG tablet Take 1 tablet by mouth 4 (four) times daily as needed for diarrhea or loose stools.  EPINEPHrine 0.3 mg/0.3 mL IJ SOAJ injection Inject 0.3 mg into the muscle as needed for anaphylaxis. 1 each 3   ipratropium (ATROVENT) 0.03 % nasal spray Place 2 sprays into both nostrils every 12 (twelve) hours. 30 mL  12   ivabradine (CORLANOR) 7.5 MG TABS tablet Take 1 tablet (7.5 mg total) by mouth 2 (two) times daily with a meal. 180 tablet 3   lamoTRIgine (LAMICTAL) 100 MG tablet Take 100 mg by mouth at bedtime.     levalbuterol (XOPENEX HFA) 45 MCG/ACT inhaler Inhale 1 puff into the lungs as needed for wheezing or shortness of breath.     levalbuterol (XOPENEX) 0.63 MG/3ML nebulizer solution Take 0.63 mg by nebulization every 6 (six) hours as needed for wheezing or shortness of breath.     montelukast (SINGULAIR) 10 MG tablet Take 1 tablet (10 mg total) by mouth at bedtime. 30 tablet 3   traZODone (DESYREL) 100 MG tablet Take 100-300 mg by mouth at bedtime as needed for sleep.     ondansetron (ZOFRAN-ODT) 4 MG disintegrating tablet Take 1 tablet (4 mg total) by mouth every 8 (eight) hours as needed for nausea or vomiting. 20 tablet 0   ABILIFY MAINTENA 400 MG SRER injection Inject 400 mg into the muscle every 28 (twenty-eight) days. (Patient not taking: Reported on 09/17/2023)     acetaminophen (TYLENOL) 500 MG tablet Take 2 tablets (1,000 mg total) by mouth every 8 (eight) hours. (Patient not taking: Reported on 07/16/2023) 30 tablet 0   No facility-administered medications prior to visit.    Family History  Problem Relation Age of Onset   Hypertension Mother     Social History   Socioeconomic History   Marital status: Married    Spouse name: Not on file   Number of children: 2   Years of education: Not on file   Highest education level: Associate degree: academic program  Occupational History   Not on file  Tobacco Use   Smoking status: Never    Passive exposure: Never   Smokeless tobacco: Never  Vaping Use   Vaping status: Never Used  Substance and Sexual Activity   Alcohol use: Yes    Alcohol/week: 5.0 standard drinks of alcohol    Types: 5 Glasses of wine per week    Comment: OCC   Drug use: No   Sexual activity: Yes    Partners: Male    Birth control/protection: Surgical   Other Topics Concern   Not on file  Social History Narrative   Regular exercise: can't at this time   Caffeine use: no   Social Determinants of Health   Financial Resource Strain: Low Risk  (10/01/2023)   Overall Financial Resource Strain (CARDIA)    Difficulty of Paying Living Expenses: Not hard at all  Food Insecurity: No Food Insecurity (10/01/2023)   Hunger Vital Sign    Worried About Running Out of Food in the Last Year: Never true    Ran Out of Food in the Last Year: Never true  Transportation Needs: No Transportation Needs (10/01/2023)   PRAPARE - Administrator, Civil Service (Medical): No    Lack of Transportation (Non-Medical): No  Physical Activity: Unknown (10/01/2023)   Exercise Vital Sign    Days of Exercise per Week: 0 days    Minutes of Exercise per Session: Not on file  Stress: No Stress Concern Present (10/01/2023)   [employer] of Occupational Health - Occupational Stress Questionnaire    Feeling  of Stress : Not at all  Social Connections: Moderately Integrated (10/01/2023)   Social Connection and Isolation Panel [NHANES]    Frequency of Communication with Friends and Family: More than three times a week    Frequency of Social Gatherings with Friends and Family: Twice a week    Attends Religious Services: More than 4 times per year    Active Member of [employer] or Organizations: No    Attends Banker Meetings: Not on file    Marital Status: Married  Catering manager Violence: Not At Risk (07/17/2023)   Humiliation, Afraid, Rape, and Kick questionnaire    Fear of Current or Ex-Partner: No    Emotionally Abused: No    Physically Abused: No    Sexually Abused: No                                                                                                  Objective:  Physical Exam: BP 118/74 (BP Location: Left Arm, Patient Position: Sitting, Cuff Size: Large)   Pulse 76   Temp (!) 97.2 F (36.2 C) (Temporal)   Wt 201 lb 3.2 oz  (91.3 kg)   SpO2 98%   BMI 29.71 kg/m   Wt Readings from Last 3 Encounters:  10/01/23 201 lb 3.2 oz (91.3 kg)  09/17/23 194 lb (88 kg)  07/16/23 203 lb 0.7 oz (92.1 kg)     Physical Exam Constitutional:      General: She is not in acute distress.    Appearance: Normal appearance. She is not ill-appearing or toxic-appearing.  HENT:     Head: Normocephalic and atraumatic.     Nose: Nose normal. No congestion.     Mouth/Throat:     Mouth: Mucous membranes are moist.  Eyes:     General: No scleral icterus.    Extraocular Movements: Extraocular movements intact.  Cardiovascular:     Rate and Rhythm: Normal rate and regular rhythm.     Pulses: Normal pulses.     Heart sounds: Normal heart sounds.  Pulmonary:     Effort: Pulmonary effort is normal. No respiratory distress.     Breath sounds: Normal breath sounds.  Abdominal:     General: Abdomen is flat. Bowel sounds are normal.     Palpations: Abdomen is soft.     Tenderness: There is generalized abdominal tenderness. There is no guarding or rebound.     Comments: GJ tube site without tenderness, drainage or surrounding erythema  Musculoskeletal:        General: Normal range of motion.  Lymphadenopathy:     Cervical: No cervical adenopathy.  Skin:    General: Skin is warm and dry.     Capillary Refill: Capillary refill takes less than 2 seconds.     Findings: No rash.  Neurological:     General: No focal deficit present.     Mental Status: She is alert and oriented to person, place, and time. Mental status is at baseline.  Psychiatric:        Mood and Affect: Mood normal.  Behavior: Behavior normal.        Thought Content: Thought content normal.        Judgment: Judgment normal.     CT Angio Abd/Pel w/ and/or w/o  Result Date: 07/23/2023 CLINICAL DATA:  Abdominal pain, nausea and vomiting. 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 History of poor gastric functioning. EXAM: CT ANGIOGRAPHY  ABDOMEN AND PELVIS WITH CONTRAST AND WITHOUT CONTRAST TECHNIQUE: Multidetector CT imaging of the abdomen and pelvis was performed using the standard protocol during bolus administration of intravenous contrast. Multiplanar reconstructed images and MIPs were obtained and reviewed to evaluate the vascular anatomy. RADIATION DOSE REDUCTION: This exam was performed according to the departmental dose-optimization program which includes automated exposure control, adjustment of the mA and/or kV according to patient size and/or use of iterative reconstruction technique. CONTRAST:  OMNIPAQUE IOHEXOL 350 MG/ML SOLN COMPARISON:  Multiple prior studies with the most recent CT of the abdomen and pelvis on 07/16/2023. FINDINGS: VASCULAR Aorta: Normal abdominal aorta demonstrating normal caliber without evidence of atherosclerosis, aneurysm or dissection. No findings to suggest vasculitis. Celiac: Normally patent and normally patent branch vessels and with normal branch vessel anatomy. Normally patent arterial supply to the stomach. SMA: Normally patent. No findings currently by CTA to suggest SMA syndrome with normal distance present between the descending SMA trunk and aorta without significant compression of the transverse duodenum. Renals: Normally patent bilateral single renal arteries. IMA: Normally patent. Inflow: Normally patent bilateral iliac arteries. Proximal Outflow: Normally patent bilateral common femoral arteries and femoral bifurcations. Veins: Venous phase imaging demonstrates normal patency of venous structures in the abdomen and pelvis including mesenteric veins, portal vein and splenic vein. The IVC, renal veins, iliac veins and common femoral veins demonstrate normal patency. Review of the MIP images confirms the above findings. NON-VASCULAR Lower chest: No acute abnormality. Hepatobiliary: No focal liver abnormality is seen. Status post cholecystectomy. No biliary dilatation. Pancreas: Unremarkable.  No pancreatic ductal dilatation or surrounding inflammatory changes. Spleen: Normal in size without focal abnormality. Adrenals/Urinary Tract: Adrenal glands are unremarkable. Kidneys are normal, without renal calculi, focal lesion, or hydronephrosis. Bladder is unremarkable. Stomach/Bowel: Status post prior gastric sleeve procedure with decompressed stomach and no evidence of complication. Bowel shows some probable mild ileus involving small bowel in the left upper quadrant demonstrating mild gaseous dilatation. No overt pattern of small-bowel obstruction is identified. The colon is decompressed. No free intraperitoneal air identified. Lymphatic: No enlarged abdominal or pelvic lymph nodes. Reproductive: Status post hysterectomy with residual left ovary visualized demonstrating small follicular cysts. The largest measures 13 mm and is likely physiologic. Other: No hernias, ascites or focal abscess identified. Musculoskeletal: No acute findings. Status post prior posterior lumbar fusion at the L5-S1 level. IMPRESSION: 1. Normal CTA of the abdomen and pelvis. No evidence of SMA syndrome currently by CTA. 2. Status post prior gastric sleeve procedure with decompressed stomach and no evidence of complication. 3. Probable mild ileus involving small bowel in the left upper quadrant demonstrating mild gaseous dilatation. No overt pattern of small-bowel obstruction is identified. 4. Status post cholecystectomy and hysterectomy. 5. Status post prior posterior lumbar fusion at the L5-S1 level. Electronically Signed   By: Irish Lack M.D.   On: 07/23/2023 08:19   CT ABDOMEN PELVIS W CONTRAST  Result Date: 07/16/2023 CLINICAL DATA:  Worsening left upper quadrant pain and left back pain for approximately 1 week. Nausea and vomiting. EXAM: CT ABDOMEN AND PELVIS WITH CONTRAST TECHNIQUE: Multidetector CT imaging of the abdomen  and pelvis was performed using the standard protocol following bolus administration of  intravenous contrast. RADIATION DOSE REDUCTION: This exam was performed according to the departmental dose-optimization program which includes automated exposure control, adjustment of the mA and/or kV according to patient size and/or use of iterative reconstruction technique. CONTRAST:  80mL OMNIPAQUE IOHEXOL 300 MG/ML  SOLN COMPARISON:  01/02/2023 and 08/18/2018 FINDINGS: Lower Chest: No acute findings. Hepatobiliary: No suspicious hepatic masses identified. Prior cholecystectomy. No evidence of biliary obstruction. Pancreas:  No mass or inflammatory changes. Spleen: Within normal limits in size and appearance. Adrenals/Urinary Tract: No suspicious masses identified. No evidence of ureteral calculi or hydronephrosis. Stomach/Bowel: Prior sleeve gastrectomy noted. No evidence of obstruction, inflammatory process or abnormal fluid collections. Vascular/Lymphatic: No pathologically enlarged lymph nodes. No acute vascular findings. Reproductive: Prior hysterectomy. Adnexal regions are unremarkable. Other:  None. Musculoskeletal:  No suspicious bone lesions identified. IMPRESSION: No acute findings or other significant abnormality. Electronically Signed   By: Danae Orleans M.D.   On: 07/16/2023 11:46    Recent Results (from the past 2160 hour(s))  Lipase, blood     Status: None   Collection Time: 07/16/23  9:09 AM  Result Value Ref Range   Lipase 29 11 - 51 U/L    Comment: Performed at [facility], [address], [place], [place] [address]  Comprehensive metabolic panel     Status: Abnormal   Collection Time: 07/16/23  9:09 AM  Result Value Ref Range   Sodium 139 135 - 145 mmol/L   Potassium 4.0 3.5 - 5.1 mmol/L   Chloride 104 98 - 111 mmol/L   CO2 29 22 - 32 mmol/L   Glucose, Bld 102 (H) 70 - 99 mg/dL    Comment: Glucose reference range applies only to samples taken after fasting for at least 8 hours.   BUN 10 6 - 20 mg/dL   Creatinine, Ser 6.57 0.44 - 1.00 mg/dL   Calcium 8.5  (L) 8.9 - 10.3 mg/dL   Total Protein 7.2 6.5 - 8.1 g/dL   Albumin 4.2 3.5 - 5.0 g/dL   AST 18 15 - 41 U/L   ALT 17 0 - 44 U/L   Alkaline Phosphatase 59 38 - 126 U/L   Total Bilirubin 0.3 0.3 - 1.2 mg/dL   GFR, Estimated >84 >69 mL/min    Comment: (NOTE) Calculated using the CKD-EPI Creatinine Equation (2021)    Anion gap 6 5 - 15    Comment: Performed at [facility], [address] [place], [place] [address]  CBC     Status: Abnormal   Collection Time: 07/16/23  9:09 AM  Result Value Ref Range   WBC 4.6 4.0 - 10.5 K/uL   RBC 3.98 3.87 - 5.11 MIL/uL   Hemoglobin 11.7 (L) 12.0 - 15.0 g/dL   HCT 84.1 (L) 32.4 - 40.1 %   MCV 90.2 80.0 - 100.0 fL   MCH 29.4 26.0 - 34.0 pg   MCHC 32.6 30.0 - 36.0 g/dL   RDW 02.7 25.3 - 66.4 %   Platelets 248 150 - 400 K/uL   nRBC 0.0 0.0 - 0.2 %    Comment: Performed at [facility], [address] [place], [place] [address]  Urinalysis, Routine w reflex microscopic -Urine, Clean Catch     Status: Abnormal   Collection Time: 07/16/23  9:59 AM  Result Value Ref Range   Color, Urine COLORLESS (A) YELLOW   APPearance CLEAR CLEAR   Specific Gravity, Urine  1.011 1.005 - 1.030   pH 6.5 5.0 - 8.0   Glucose, UA NEGATIVE NEGATIVE mg/dL   Hgb urine dipstick NEGATIVE NEGATIVE   Bilirubin Urine NEGATIVE NEGATIVE   Ketones, ur NEGATIVE NEGATIVE mg/dL   Protein, ur NEGATIVE NEGATIVE mg/dL   Nitrite NEGATIVE NEGATIVE   Leukocytes,Ua NEGATIVE NEGATIVE    Comment: Performed at [facility], [address] [place], [place] [address]  Comprehensive metabolic panel     Status: Abnormal   Collection Time: 07/17/23  3:42 AM  Result Value Ref Range   Sodium 137 135 - 145 mmol/L   Potassium 3.4 (L) 3.5 - 5.1 mmol/L   Chloride 105 98 - 111 mmol/L   CO2 25 22 - 32 mmol/L   Glucose, Bld 94 70 - 99 mg/dL    Comment: Glucose reference range applies only to samples taken after fasting for at least 8 hours.    BUN 8 6 - 20 mg/dL   Creatinine, Ser 5.95 0.44 - 1.00 mg/dL   Calcium 7.5 (L) 8.9 - 10.3 mg/dL   Total Protein 5.8 (L) 6.5 - 8.1 g/dL   Albumin 3.3 (L) 3.5 - 5.0 g/dL   AST 18 15 - 41 U/L   ALT 19 0 - 44 U/L   Alkaline Phosphatase 47 38 - 126 U/L   Total Bilirubin 0.3 0.3 - 1.2 mg/dL   GFR, Estimated >63 >87 mL/min    Comment: (NOTE) Calculated using the CKD-EPI Creatinine Equation (2021)    Anion gap 7 5 - 15    Comment: Performed at [facility], [address], [place], [place] [address]  CBC     Status: Abnormal   Collection Time: 07/17/23  3:42 AM  Result Value Ref Range   WBC 4.6 4.0 - 10.5 K/uL   RBC 3.53 (L) 3.87 - 5.11 MIL/uL   Hemoglobin 10.4 (L) 12.0 - 15.0 g/dL   HCT 29.5 (L) 18.8 - 41.6 %   MCV 94.3 80.0 - 100.0 fL   MCH 29.5 26.0 - 34.0 pg   MCHC 31.2 30.0 - 36.0 g/dL   RDW 60.6 30.1 - 60.1 %   Platelets 196 150 - 400 K/uL   nRBC 0.0 0.0 - 0.2 %    Comment: Performed at [facility], [address] [place], [place] [address]  Magnesium     Status: None   Collection Time: 07/17/23  3:42 AM  Result Value Ref Range   Magnesium 2.0 1.7 - 2.4 mg/dL    Comment: Performed at [facility], [address], [place], [place] [address]  Lipase, blood     Status: None   Collection Time: 07/17/23  1:42 PM  Result Value Ref Range   Lipase 32 11 - 51 U/L    Comment: Performed at [facility], [address], [place], [place] [address]  Basic metabolic panel     Status: Abnormal   Collection Time: 07/18/23  3:52 AM  Result Value Ref Range   Sodium 138 135 - 145 mmol/L   Potassium 3.6 3.5 - 5.1 mmol/L   Chloride 106 98 - 111 mmol/L   CO2 23 22 - 32 mmol/L   Glucose, Bld 98 70 - 99 mg/dL    Comment: Glucose reference range applies only to samples taken after fasting for at least 8 hours.   BUN 5 (L) 6 - 20 mg/dL   Creatinine, Ser 2.70 0.44 - 1.00 mg/dL   Calcium 7.7 (L)  8.9 - 10.3 mg/dL    GFR, Estimated >16 >10 mL/min    Comment: (NOTE) Calculated using the CKD-EPI Creatinine Equation (2021)    Anion gap 9 5 - 15    Comment: Performed at [facility], [address], [place], [place] [address]  Glucose, capillary     Status: Abnormal   Collection Time: 07/20/23 11:32 PM  Result Value Ref Range   Glucose-Capillary 109 (H) 70 - 99 mg/dL    Comment: Glucose reference range applies only to samples taken after fasting for at least 8 hours.  Basic metabolic panel     Status: Abnormal   Collection Time: 07/21/23  4:11 AM  Result Value Ref Range   Sodium 137 135 - 145 mmol/L   Potassium 3.8 3.5 - 5.1 mmol/L   Chloride 107 98 - 111 mmol/L   CO2 22 22 - 32 mmol/L   Glucose, Bld 102 (H) 70 - 99 mg/dL    Comment: Glucose reference range applies only to samples taken after fasting for at least 8 hours.   BUN <5 (L) 6 - 20 mg/dL   Creatinine, Ser 4.09 0.44 - 1.00 mg/dL   Calcium 8.0 (L) 8.9 - 10.3 mg/dL   GFR, Estimated >81 >19 mL/min    Comment: (NOTE) Calculated using the CKD-EPI Creatinine Equation (2021)    Anion gap 8 5 - 15    Comment: Performed at [facility], [address], [place], [place] [address]  Gastrointestinal Panel by PCR , Stool     Status: None   Collection Time: 07/21/23  3:25 PM   Specimen: Stool  Result Value Ref Range   Campylobacter species NOT DETECTED NOT DETECTED   Plesimonas shigelloides NOT DETECTED NOT DETECTED   Salmonella species NOT DETECTED NOT DETECTED   Yersinia enterocolitica NOT DETECTED NOT DETECTED   Vibrio species NOT DETECTED NOT DETECTED   Vibrio cholerae NOT DETECTED NOT DETECTED   Enteroaggregative E coli (EAEC) NOT DETECTED NOT DETECTED   Enteropathogenic E coli (EPEC) NOT DETECTED NOT DETECTED   Enterotoxigenic E coli (ETEC) NOT DETECTED NOT DETECTED   Shiga like toxin producing E coli (STEC) NOT DETECTED NOT DETECTED   Shigella/Enteroinvasive E coli (EIEC) NOT DETECTED NOT DETECTED    Cryptosporidium NOT DETECTED NOT DETECTED   Cyclospora cayetanensis NOT DETECTED NOT DETECTED   Entamoeba histolytica NOT DETECTED NOT DETECTED   Giardia lamblia NOT DETECTED NOT DETECTED   Adenovirus F40/41 NOT DETECTED NOT DETECTED   Astrovirus NOT DETECTED NOT DETECTED   Norovirus GI/GII NOT DETECTED NOT DETECTED   Rotavirus A NOT DETECTED NOT DETECTED   Sapovirus (I, II, IV, and V) NOT DETECTED NOT DETECTED    Comment: Performed at [facility], [address] [place], [place] [address]  Comprehensive metabolic panel     Status: Abnormal   Collection Time: 07/22/23  3:42 AM  Result Value Ref Range   Sodium 137 135 - 145 mmol/L   Potassium 3.5 3.5 - 5.1 mmol/L   Chloride 107 98 - 111 mmol/L   CO2 24 22 - 32 mmol/L   Glucose, Bld 101 (H) 70 - 99 mg/dL    Comment: Glucose reference range applies only to samples taken after fasting for at least 8 hours.   BUN <5 (L) 6 - 20 mg/dL   Creatinine, Ser 3.08 0.44 - 1.00 mg/dL   Calcium 8.0 (L) 8.9 - 10.3 mg/dL   Total Protein 6.4 (L) 6.5 - 8.1 g/dL  Albumin 3.3 (L) 3.5 - 5.0 g/dL   AST 40 15 - 41 U/L   ALT 37 0 - 44 U/L   Alkaline Phosphatase 65 38 - 126 U/L   Total Bilirubin 0.4 0.3 - 1.2 mg/dL   GFR, Estimated >40 >98 mL/min    Comment: (NOTE) Calculated using the CKD-EPI Creatinine Equation (2021)    Anion gap 6 5 - 15    Comment: Performed at [facility], [address] [place], [place] [address]  CBC with Differential/Platelet     Status: Abnormal   Collection Time: 07/22/23  3:42 AM  Result Value Ref Range   WBC 5.1 4.0 - 10.5 K/uL   RBC 3.66 (L) 3.87 - 5.11 MIL/uL   Hemoglobin 10.6 (L) 12.0 - 15.0 g/dL   HCT 78.2 (L) 95.6 - 21.3 %   MCV 94.0 80.0 - 100.0 fL   MCH 29.0 26.0 - 34.0 pg   MCHC 30.8 30.0 - 36.0 g/dL   RDW 08.6 57.8 - 46.9 %   Platelets 197 150 - 400 K/uL   nRBC 0.0 0.0 - 0.2 %   Neutrophils Relative % 53 %   Neutro Abs 2.7 1.7 - 7.7 K/uL   Lymphocytes Relative 33 %    Lymphs Abs 1.7 0.7 - 4.0 K/uL   Monocytes Relative 10 %   Monocytes Absolute 0.5 0.1 - 1.0 K/uL   Eosinophils Relative 4 %   Eosinophils Absolute 0.2 0.0 - 0.5 K/uL   Basophils Relative 0 %   Basophils Absolute 0.0 0.0 - 0.1 K/uL   Immature Granulocytes 0 %   Abs Immature Granulocytes 0.02 0.00 - 0.07 K/uL    Comment: Performed at [facility], [address] [place], [place] [address]  Lipase, blood     Status: None   Collection Time: 07/22/23  3:42 AM  Result Value Ref Range   Lipase 22 11 - 51 U/L    Comment: Performed at [facility], [address], [place], [place] [address]  CBC     Status: Abnormal   Collection Time: 07/23/23  3:52 AM  Result Value Ref Range   WBC 4.6 4.0 - 10.5 K/uL   RBC 3.91 3.87 - 5.11 MIL/uL   Hemoglobin 11.4 (L) 12.0 - 15.0 g/dL   HCT 44.0 10.2 - 72.5 %   MCV 93.1 80.0 - 100.0 fL   MCH 29.2 26.0 - 34.0 pg   MCHC 31.3 30.0 - 36.0 g/dL   RDW 36.6 44.0 - 34.7 %   Platelets 215 150 - 400 K/uL   nRBC 0.0 0.0 - 0.2 %    Comment: Performed at [facility], [address], [place], [place] [address]  Basic metabolic panel     Status: Abnormal   Collection Time: 07/23/23  3:52 AM  Result Value Ref Range   Sodium 138 135 - 145 mmol/L   Potassium 3.7 3.5 - 5.1 mmol/L   Chloride 107 98 - 111 mmol/L   CO2 23 22 - 32 mmol/L   Glucose, Bld 102 (H) 70 - 99 mg/dL    Comment: Glucose reference range applies only to samples taken after fasting for at least 8 hours.   BUN <5 (L) 6 - 20 mg/dL   Creatinine, Ser 6.38 0.44 - 1.00 mg/dL   Calcium 8.4 (L) 8.9 - 10.3 mg/dL   GFR, Estimated >75 >64 mL/min    Comment: (NOTE) Calculated using the CKD-EPI Creatinine Equation (2021)  Anion gap 8 5 - 15    Comment: Performed at [facility], [address] [place], [place] [address]  Basic metabolic panel     Status: Abnormal   Collection Time: 07/24/23  7:56 AM  Result Value Ref Range    Sodium 137 135 - 145 mmol/L   Potassium 3.8 3.5 - 5.1 mmol/L   Chloride 103 98 - 111 mmol/L   CO2 24 22 - 32 mmol/L   Glucose, Bld 95 70 - 99 mg/dL    Comment: Glucose reference range applies only to samples taken after fasting for at least 8 hours.   BUN 5 (L) 6 - 20 mg/dL   Creatinine, Ser 9.14 0.44 - 1.00 mg/dL   Calcium 8.7 (L) 8.9 - 10.3 mg/dL   GFR, Estimated >78 >29 mL/min    Comment: (NOTE) Calculated using the CKD-EPI Creatinine Equation (2021)    Anion gap 10 5 - 15    Comment: Performed at [facility], [address], [place], [place] [address]  Comp Met (CMET)     Status: None   Collection Time: 09/17/23  2:30 PM  Result Value Ref Range   Sodium 138 135 - 145 mEq/L   Potassium 4.2 3.5 - 5.1 mEq/L   Chloride 102 96 - 112 mEq/L   CO2 25 19 - 32 mEq/L   Glucose, Bld 78 70 - 99 mg/dL   BUN 14 6 - 23 mg/dL   Creatinine, Ser 0.86 0.40 - 1.20 mg/dL   Total Bilirubin 0.3 0.2 - 1.2 mg/dL   Alkaline Phosphatase 97 39 - 117 U/L   AST 16 0 - 37 U/L   ALT 21 0 - 35 U/L   Total Protein 7.7 6.0 - 8.3 g/dL   Albumin 4.5 3.5 - 5.2 g/dL   GFR 578.46 >96.29 mL/min    Comment: Calculated using the CKD-EPI Creatinine Equation (2021)   Calcium 9.5 8.4 - 10.5 mg/dL  CBC w/Diff     Status: None   Collection Time: 09/17/23  2:30 PM  Result Value Ref Range   WBC 7.5 4.0 - 10.5 K/uL   RBC 4.19 3.87 - 5.11 Mil/uL   Hemoglobin 12.5 12.0 - 15.0 g/dL   HCT 52.8 41.3 - 24.4 %   MCV 91.6 78.0 - 100.0 fl   MCHC 32.6 30.0 - 36.0 g/dL   RDW 01.0 27.2 - 53.6 %   Platelets 263.0 150.0 - 400.0 K/uL   Neutrophils Relative % 65.1 43.0 - 77.0 %   Lymphocytes Relative 24.7 12.0 - 46.0 %   Monocytes Relative 7.5 3.0 - 12.0 %   Eosinophils Relative 2.0 0.0 - 5.0 %   Basophils Relative 0.7 0.0 - 3.0 %   Neutro Abs 4.9 1.4 - 7.7 K/uL   Lymphs Abs 1.9 0.7 - 4.0 K/uL   Monocytes Absolute 0.6 0.1 - 1.0 K/uL   Eosinophils Absolute 0.1 0.0 - 0.7 K/uL   Basophils Absolute 0.1 0.0 -  0.1 K/uL  Urinalysis, Routine w reflex microscopic     Status: Abnormal   Collection Time: 09/17/23  2:30 PM  Result Value Ref Range   Color, Urine YELLOW Yellow;Lt. Yellow;Straw;Dark Yellow;Amber;Green;Red;Brown   APPearance Cloudy (A) Clear;Turbid;Slightly Cloudy;Cloudy   Specific Gravity, Urine >=1.030 (A) 1.000 - 1.030   pH 6.0 5.0 - 8.0   Total Protein, Urine TRACE (A) Negative   Urine Glucose NEGATIVE Negative   Ketones, ur TRACE (A) Negative   Bilirubin Urine NEGATIVE Negative  Hgb urine dipstick NEGATIVE Negative   Urobilinogen, UA 1.0 0.0 - 1.0   Leukocytes,Ua NEGATIVE Negative   Nitrite NEGATIVE Negative   WBC, UA 0-2/hpf 0-2/hpf   RBC / HPF none seen 0-2/hpf   Mucus, UA Presence of (A) None   Squamous Epithelial / HPF Few(5-10/hpf) (A) Rare(0-4/hpf)   Amorphous Present (A) None;Present        Garner Nash, MD, MS

## 2023-10-01 NOTE — Assessment & Plan Note (Signed)
Ongoing; experiencing frequent bowel movements despite interventions.  Plan:  Discontinued Motegrity  Continue Lomotil as needed for diarrhea. Patient to meet with nutritionist to assess and change enteral formula. Monitor bowel movement frequency and consistency.

## 2023-10-01 NOTE — Assessment & Plan Note (Signed)
Ongoing abdominal pain. Improved with tramadol use. Patient requesting refill.  Plan: Continue Tramadol 50?mg orally every 6 hours as needed for pain relief; refill prescription.  Obtain urine drug screen and have patient sign opioid agreement per chronic opioid use policy. Monitor for changes in pain pattern or severity. Patient to follow up with Dr. Jeani Sow, specialist in gastric emptying dysfunction, for further evaluation. Proceed with scheduled meeting with nutritionist to assess for feed intolerance and adjust enteral formula as needed. Return to clinic if pain worsens or new symptoms develop.

## 2023-10-02 DIAGNOSIS — R1110 Vomiting, unspecified: Secondary | ICD-10-CM | POA: Diagnosis not present

## 2023-10-02 DIAGNOSIS — R634 Abnormal weight loss: Secondary | ICD-10-CM | POA: Diagnosis not present

## 2023-10-03 DIAGNOSIS — R112 Nausea with vomiting, unspecified: Secondary | ICD-10-CM | POA: Diagnosis not present

## 2023-10-03 DIAGNOSIS — G901 Familial dysautonomia [Riley-Day]: Secondary | ICD-10-CM | POA: Diagnosis not present

## 2023-10-05 ENCOUNTER — Encounter: Payer: Self-pay | Admitting: Family Medicine

## 2023-10-05 LAB — DRUG TEST, GENERAL TOXICOLOGY, URINE
Acetone: NOT DETECTED
Ethanol: 5 mg/dL — ABNORMAL HIGH
Isopropanol: NOT DETECTED
Methanol: NOT DETECTED

## 2023-10-17 ENCOUNTER — Ambulatory Visit: Payer: BC Managed Care – PPO | Attending: Cardiology | Admitting: Cardiology

## 2023-10-17 ENCOUNTER — Encounter: Payer: Self-pay | Admitting: Cardiology

## 2023-10-17 VITALS — BP 98/64 | HR 64 | Resp 16 | Ht 69.0 in | Wt 206.8 lb

## 2023-10-17 DIAGNOSIS — I951 Orthostatic hypotension: Secondary | ICD-10-CM | POA: Diagnosis not present

## 2023-10-17 DIAGNOSIS — K3184 Gastroparesis: Secondary | ICD-10-CM | POA: Diagnosis not present

## 2023-10-17 NOTE — Patient Instructions (Signed)
Medication Instructions:  Your physician recommends that you continue on your current medications as directed. Please refer to the Current Medication list given to you today.  *If you need a refill on your cardiac medications before your next appointment, please call your pharmacy*   Lab Work: none If you have labs (blood work) drawn today and your tests are completely normal, you will receive your results only by: MyChart Message (if you have MyChart) OR A paper copy in the mail If you have any lab test that is abnormal or we need to change your treatment, we will call you to review the results.   Testing/Procedures: none   Follow-Up: At [facility], you and your health needs are our priority.  As part of our continuing mission to provide you with exceptional heart care, we have created designated Provider Care Teams.  These Care Teams include your primary Cardiologist (physician) and Advanced Practice Providers (APPs -  Physician Assistants and Nurse Practitioners) who all work together to provide you with the care you need, when you need it.  We recommend signing up for the patient portal called "MyChart".  Sign up information is provided on this After Visit Summary.  MyChart is used to connect with patients for Virtual Visits (Telemedicine).  Patients are able to view lab/test results, encounter notes, upcoming appointments, etc.  Non-urgent messages can be sent to your provider as well.   To learn more about what you can do with MyChart, go to [email or web address].    Your next appointment:   12 month(s)  Provider:   Yates Decamp, MD     Other Instructions

## 2023-10-17 NOTE — Progress Notes (Signed)
Cardiology Office Note:  .   Date:  10/17/2023  ID:  Kaitlyn Johnson, DOB [birth year], MRN [id number] PCP: Garnette Gunner, MD  [facility] Providers Cardiologist:  Yates Decamp, MD   History of Present Illness: Kaitlyn Johnson is a 42 y.o. female with hernitated ruptured cervical disc in 2015 and was placed on chronic steroid therapy, since then she developed Cushing's disease, significant weight gain. 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 Since then patient has developed marked dizziness, fatigue, rapid palpitations.  Her recent hospitalization was in September 2024 for nausea, intractable abdominal discomfort eventually needing transfer to [facility] and workup was negative with EGD and has now a gastrostomy tube placed on 09/09/2023.  Discussed the use of AI scribe software for clinical note transcription with the patient, who gave verbal consent to proceed.  History of Present Illness   The patient, with a history of gastroparesis and POTS, presents for follow-up. She reports ongoing issues with gastroparesis, necessitating a G-tube for nutrition. Despite this, she reports stable weight and is awaiting consultation with a specialist in [place] for possible gastric implant. She reports that every time she eats, she vomits, and thus relies on the G-tube for all nutrition.  Regarding her POTS, since starting on Ivabradine (Corlanor), she reports a significant improvement in heart rate control. She reports feeling well, with no significant issues related to her heart condition. She also mentions a medication, Domperidone, which was suggested but not pursued  due to potential similar side effects to Reglan.      Review of Systems  Cardiovascular:  Negative for chest pain, dyspnea on exertion and leg swelling.    Labs   Lab Results  Component Value Date   CHOL 187 11/30/2013   HDL 49 11/30/2013   LDLCALC 117 (H) 11/30/2013   TRIG 197 (H) 08/25/2018   CHOLHDL 3.8 11/30/2013   Lab Results  Component Value Date   NA 138 09/17/2023   K 4.2 09/17/2023   CO2 25 09/17/2023   GLUCOSE 78 09/17/2023   BUN 14 09/17/2023   CREATININE 0.66 09/17/2023   CALCIUM 9.5 09/17/2023   GFR 110.13 09/17/2023   EGFR 95 07/06/2022   GFRNONAA >60 07/24/2023      Latest Ref Rng & Units 09/17/2023    2:30 PM 07/24/2023    7:56 AM 07/23/2023    3:52 AM  BMP  Glucose 70 - 99 mg/dL 78  95  440   BUN 6 - 23 mg/dL 14  5  <5   Creatinine 0.40 - 1.20 mg/dL 3.47  4.25  9.56   Sodium 135 - 145 mEq/L 138  137  138   Potassium 3.5 - 5.1 mEq/L 4.2  3.8  3.7   Chloride 96 - 112 mEq/L 102  103  107   CO2 19 - 32 mEq/L 25  24  23    Calcium 8.4 - 10.5 mg/dL 9.5  8.7  8.4       Latest Ref Rng & Units 09/17/2023    2:30 PM 07/23/2023    3:52 AM 07/22/2023    3:42 AM  CBC  WBC 4.0 - 10.5 K/uL 7.5  4.6  5.1   Hemoglobin 12.0 - 15.0 g/dL 12.5  11.4  10.6   Hematocrit 36.0 - 46.0 % 38.4  36.4  34.4   Platelets 150.0 - 400.0 K/uL 263.0  215  197     Physical Exam:   VS:  BP 98/64 (BP Location: Right Arm, Patient Position: Sitting, Cuff Size: Large)   Pulse 64   Resp 16   Ht 5\' 9"  (1.753 m)   Wt 206 lb 12.8 oz (93.8 kg)   SpO2 98%   BMI 30.54 kg/m    Wt Readings from Last 3 Encounters:  10/17/23 206 lb 12.8 oz (93.8 kg)  10/01/23 201 lb 3.2 oz (91.3 kg)  09/17/23 194 lb (88 kg)     Physical Exam Neck:     Vascular: No carotid bruit or JVD.  Cardiovascular:     Rate and Rhythm: Normal rate and regular rhythm.     Pulses: Intact distal pulses.     Heart sounds: Normal heart sounds. No murmur heard.    No gallop.  Pulmonary:     Effort: Pulmonary  effort is normal.     Breath sounds: Normal breath sounds.  Abdominal:     General: Bowel sounds are normal.     Palpations: Abdomen is soft.     Comments: G-Tube in situ  Musculoskeletal:     Right lower leg: No edema.     Left lower leg: No edema.     Studies Reviewed: .    Echocardiogram 09/07/2022:  Normal LV systolic function with visual EF 60-65%. Left ventricle cavity is normal in size. Normal left ventricular wall thickness. Normal global wall motion. Normal diastolic filling pattern, normal LAP. Calculated EF 68%. Structurally normal tricuspid valve with no regurgitation. No evidence of pulmonary hypertension. No prior available for comparison.  EKG:    EKG Interpretation Date/Time:  Thursday October 17 2023 09:11:24 EST Ventricular Rate:  62 PR Interval:  156 QRS Duration:  76 QT Interval:  434 QTC Calculation: 440 R Axis:   54  Text Interpretation: 10/17/2023: Normal sinus rhythm at rate of 62 bpm, normal axis, no evidence of ischemia, normal EKG.  Compared to 07/06/2022, heart rate has reduced from 98 bpm. Confirmed by Delrae Rend [phone]) on 10/17/2023 9:31:17 AM    EKG 10/11/2022: Normal sinus rhythm at rate of 70 bpm, normal axis, T wave inversion in anterior leads, normal variant.  No evidence of ischemia, normal QT interval.  Compared to 07/06/2022, sinus tachycardia not present.  Medications and allergies    Allergies  Allergen Reactions   Bee Venom Anaphylaxis   Reglan [Metoclopramide] Other (See Comments)    Reaction:  Oculogyric crisis    Shellfish Allergy Anaphylaxis   Adhesive [Tape] Hives   Measles, Mumps & Rubella Vac     Other reaction(s): slept for 28 hrs   Sulfa Antibiotics Rash   Yellow Dye Nausea Only and Rash    Occurred from oral iron with yellow enteric coating Has not tried any other oral iron preparation (so can't rule out any other excipient as causative agent)     Current Outpatient Medications:    ABILIFY MAINTENA 400 MG SRER  injection, Inject 400 mg into the muscle every 28 (twenty-eight) days., Disp: , Rfl:    acebutolol (SECTRAL) 200 MG capsule, Take 1 capsule (200 mg total) by mouth 2 (two) times daily., Disp: 180 capsule, Rfl: 3   acetaminophen (TYLENOL) 500 MG tablet, Take 2 tablets (1,000 mg total) by mouth every 8 (eight) hours., Disp: 30 tablet, Rfl: 0   albuterol (  VENTOLIN HFA) 108 (90 Base) MCG/ACT inhaler, Inhale 1 puff into the lungs as needed for wheezing or shortness of breath., Disp: , Rfl:    AMBIEN CR 12.5 MG CR tablet, Take 12.5 mg by mouth at bedtime., Disp: , Rfl: 1   ARIPiprazole ER (ABILIFY MAINTENA) 400 MG PRSY prefilled syringe, Inject 400 mg into the muscle every 28 (twenty-eight) days., Disp: 3 each, Rfl: 1   busPIRone (BUSPAR) 10 MG tablet, Take 20 mg by mouth 3 (three) times daily., Disp: , Rfl:    clonazePAM (KLONOPIN) 0.5 MG tablet, Take 0.5 mg by mouth 2 (two) times daily as needed for anxiety., Disp: , Rfl:    cyanocobalamin (VITAMIN B12) 1000 MCG/ML injection, Inject 1,000 mcg into the skin every 30 (thirty) days., Disp: , Rfl:    desipramine (NORPRAMIN) 25 MG tablet, Take 25 mg by mouth at bedtime., Disp: , Rfl:    DEXILANT 30 MG capsule, Take 30 mg by mouth 2 (two) times daily., Disp: , Rfl:    dicyclomine (BENTYL) 10 MG capsule, Take 1 capsule (10 mg total) by mouth 3 (three) times daily as needed for spasms (Pain/ spasms)., Disp: , Rfl:    diphenoxylate-atropine (LOMOTIL) 2.5-0.025 MG tablet, Take 1 tablet by mouth 4 (four) times daily as needed for diarrhea or loose stools., Disp: , Rfl:    EPINEPHrine 0.3 mg/0.3 mL IJ SOAJ injection, Inject 0.3 mg into the muscle as needed for anaphylaxis., Disp: 1 each, Rfl: 3   ipratropium (ATROVENT) 0.03 % nasal spray, Place 2 sprays into both nostrils every 12 (twelve) hours., Disp: 30 mL, Rfl: 12   ivabradine (CORLANOR) 7.5 MG TABS tablet, Take 1 tablet (7.5 mg total) by mouth 2 (two) times daily with a meal., Disp: 180 tablet, Rfl: 3    lamoTRIgine (LAMICTAL) 100 MG tablet, Take 100 mg by mouth at bedtime., Disp: , Rfl:    levalbuterol (XOPENEX HFA) 45 MCG/ACT inhaler, Inhale 1 puff into the lungs as needed for wheezing or shortness of breath., Disp: , Rfl:    levalbuterol (XOPENEX) 0.63 MG/3ML nebulizer solution, Take 0.63 mg by nebulization every 6 (six) hours as needed for wheezing or shortness of breath., Disp: , Rfl:    montelukast (SINGULAIR) 10 MG tablet, Take 1 tablet (10 mg total) by mouth at bedtime., Disp: 30 tablet, Rfl: 3   ondansetron (ZOFRAN-ODT) 4 MG disintegrating tablet, Take 1 tablet (4 mg total) by mouth every 8 (eight) hours as needed for nausea or vomiting., Disp: 20 tablet, Rfl: 0   traMADol (ULTRAM) 50 MG tablet, Take 1-2 tablets (50-100 mg total) by mouth 4 (four) times daily., Disp: 240 tablet, Rfl: 0   traZODone (DESYREL) 100 MG tablet, Take 100-300 mg by mouth at bedtime as needed for sleep., Disp: , Rfl:    ASSESSMENT AND PLAN: .      ICD-10-CM   1. 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 Currently receiving nutrition via G-tube. Pending consultation with Dr. Zachery Dauer in [place] for potential gastric stimulator implantation. -Continue current G-tube feeds as directed by nutritionist.  Tachycardia Stable on Corlanor (Ivabradine) with significant reduction in heart rate and improved symptoms. She is also on acebutolol 200 mg twice daily. -Continue Corlanor as prescribed. -Follow-up in 1 year.   I spent a total of 15 minutes in reviewing external records from [facility] and also  recent hospitalization, review of labs and hospital course, 10 minutes in charting and 10 minutes with the patient.  Signed,  Yates Decamp, MD, [facility] 10/17/2023, 9:26 PM [facility] [address] [place], [place] [address] Phone: [phone]. Fax:  [phone]

## 2023-10-19 DIAGNOSIS — Z931 Gastrostomy status: Secondary | ICD-10-CM | POA: Diagnosis not present

## 2023-10-28 ENCOUNTER — Ambulatory Visit
Admission: RE | Admit: 2023-10-28 | Discharge: 2023-10-28 | Disposition: A | Discharge status: Home / Self Care | Payer: BC Managed Care – PPO | Source: Ambulatory Visit | Attending: Obstetrics and Gynecology | Admitting: Obstetrics and Gynecology

## 2023-10-28 DIAGNOSIS — Z1231 Encounter for screening mammogram for malignant neoplasm of breast: Secondary | ICD-10-CM | POA: Diagnosis not present

## 2023-10-29 ENCOUNTER — Other Ambulatory Visit: Payer: Self-pay | Admitting: Family Medicine

## 2023-10-30 DIAGNOSIS — Z931 Gastrostomy status: Secondary | ICD-10-CM | POA: Diagnosis not present

## 2023-11-05 ENCOUNTER — Other Ambulatory Visit: Payer: Self-pay | Admitting: Obstetrics and Gynecology

## 2023-11-05 DIAGNOSIS — R928 Other abnormal and inconclusive findings on diagnostic imaging of breast: Secondary | ICD-10-CM

## 2023-11-12 ENCOUNTER — Ambulatory Visit
Admission: RE | Admit: 2023-11-12 | Discharge: 2023-11-12 | Disposition: A | Discharge status: Home / Self Care | Payer: BC Managed Care – PPO | Source: Ambulatory Visit | Attending: Obstetrics and Gynecology | Admitting: Obstetrics and Gynecology

## 2023-11-12 ENCOUNTER — Other Ambulatory Visit: Payer: Self-pay | Admitting: Obstetrics and Gynecology

## 2023-11-12 DIAGNOSIS — N6489 Other specified disorders of breast: Secondary | ICD-10-CM | POA: Diagnosis not present

## 2023-11-12 DIAGNOSIS — N6311 Unspecified lump in the right breast, upper outer quadrant: Secondary | ICD-10-CM | POA: Diagnosis not present

## 2023-11-12 DIAGNOSIS — R928 Other abnormal and inconclusive findings on diagnostic imaging of breast: Secondary | ICD-10-CM

## 2023-11-12 DIAGNOSIS — C50411 Malignant neoplasm of upper-outer quadrant of right female breast: Secondary | ICD-10-CM | POA: Diagnosis not present

## 2023-11-12 DIAGNOSIS — R92331 Mammographic heterogeneous density, right breast: Secondary | ICD-10-CM | POA: Diagnosis not present

## 2023-11-12 HISTORY — PX: BREAST BIOPSY: SHX20

## 2023-11-13 LAB — SURGICAL PATHOLOGY

## 2023-11-14 DIAGNOSIS — K3184 Gastroparesis: Secondary | ICD-10-CM | POA: Diagnosis not present

## 2023-11-15 DIAGNOSIS — K3184 Gastroparesis: Secondary | ICD-10-CM | POA: Diagnosis not present

## 2023-11-16 DIAGNOSIS — K3184 Gastroparesis: Secondary | ICD-10-CM | POA: Diagnosis not present

## 2023-11-17 DIAGNOSIS — K3184 Gastroparesis: Secondary | ICD-10-CM | POA: Diagnosis not present

## 2023-11-18 DIAGNOSIS — K3184 Gastroparesis: Secondary | ICD-10-CM | POA: Diagnosis not present

## 2023-11-19 DIAGNOSIS — K3184 Gastroparesis: Secondary | ICD-10-CM | POA: Diagnosis not present

## 2023-11-20 ENCOUNTER — Other Ambulatory Visit: Payer: Self-pay | Admitting: Surgery

## 2023-11-20 DIAGNOSIS — C50911 Malignant neoplasm of unspecified site of right female breast: Secondary | ICD-10-CM | POA: Diagnosis not present

## 2023-11-20 DIAGNOSIS — K3184 Gastroparesis: Secondary | ICD-10-CM | POA: Diagnosis not present

## 2023-11-21 ENCOUNTER — Telehealth: Payer: Self-pay | Admitting: Hematology and Oncology

## 2023-11-21 ENCOUNTER — Other Ambulatory Visit: Payer: BC Managed Care – PPO

## 2023-11-21 ENCOUNTER — Other Ambulatory Visit: Payer: Self-pay | Admitting: *Deleted

## 2023-11-21 DIAGNOSIS — C50411 Malignant neoplasm of upper-outer quadrant of right female breast: Secondary | ICD-10-CM | POA: Insufficient documentation

## 2023-11-21 DIAGNOSIS — K3184 Gastroparesis: Secondary | ICD-10-CM | POA: Diagnosis not present

## 2023-11-21 NOTE — Telephone Encounter (Signed)
Spoke with patient confirming upcoming appointment  

## 2023-11-22 ENCOUNTER — Telehealth: Payer: Self-pay | Admitting: Genetic Counselor

## 2023-11-22 ENCOUNTER — Encounter: Payer: Self-pay | Admitting: Physical Therapy

## 2023-11-22 ENCOUNTER — Ambulatory Visit: Payer: BC Managed Care – PPO | Attending: Surgery | Admitting: Physical Therapy

## 2023-11-22 ENCOUNTER — Other Ambulatory Visit: Payer: Self-pay

## 2023-11-22 ENCOUNTER — Other Ambulatory Visit: Payer: Self-pay | Admitting: Cardiology

## 2023-11-22 DIAGNOSIS — C50911 Malignant neoplasm of unspecified site of right female breast: Secondary | ICD-10-CM | POA: Insufficient documentation

## 2023-11-22 DIAGNOSIS — Z170 Estrogen receptor positive status [ER+]: Secondary | ICD-10-CM | POA: Diagnosis not present

## 2023-11-22 DIAGNOSIS — R293 Abnormal posture: Secondary | ICD-10-CM | POA: Insufficient documentation

## 2023-11-22 DIAGNOSIS — K3184 Gastroparesis: Secondary | ICD-10-CM | POA: Diagnosis not present

## 2023-11-22 NOTE — Therapy (Signed)
OUTPATIENT PHYSICAL THERAPY BREAST CANCER BASELINE EVALUATION   Patient Name: Kaitlyn Johnson MRN: [id number] DOB:1981/07/25, 43 y.o., female Today's Date: 11/22/2023  END OF SESSION:  PT End of Session - 11/22/23 0845     Visit Number 1    Number of Visits 2    Date for PT Re-Evaluation 01/03/24    PT Start Time 0803    PT Stop Time 0845    PT Time Calculation (min) 42 min    Activity Tolerance Patient tolerated treatment well    Behavior During Therapy [facility] for tasks assessed/performed             Past Medical History:  Diagnosis Date   Acute appendicitis 02/28/2017   Acute pancreatitis 11/30/2013   Anemia 11/26/2011   Anxiety    Asthma    Celiac and mesenteric artery injury    Cushing's syndrome (HCC)    06/21/16- "in remission"   Depression    H/O hiatal hernia    History of kidney stones    Iatrogenic Cushing's syndrome (HCC) 10/02/2013   Nausea vomiting and diarrhea 11/23/2013   Nausea with vomiting 11/30/2013   Palpitations 12/05/2018   Pancreatitis 11/29/2013   POTS (postural orthostatic tachycardia syndrome)    Shortness of breath dyspnea    with exertertion   Sphincter of Oddi dysfunction    Past Surgical History:  Procedure Laterality Date   ABDOMINAL HYSTERECTOMY  08/2010   ANTERIOR CERVICAL DECOMP/DISCECTOMY FUSION N/A 03/31/2013   Procedure: ANTERIOR CERVICAL DECOMPRESSION/DISCECTOMY FUSION 1 LEVEL Cervical five-six;  Surgeon: Reinaldo Meeker, MD;  Location: [facility];  Service: Neurosurgery;  Laterality: N/A;   APPENDECTOMY     BACK SURGERY     BREAST BIOPSY Right 11/12/2023   [place] RT BREAST BX W LOC DEV 1ST LESION IMG BX SPEC US GUIDE 11/12/2023 [facility]   [place]-Pancreatography with Spintectorotomy + Stent  07/16/2012   01/15/14   CHOLECYSTECTOMY     ESOPHAGOGASTRODUODENOSCOPY (EGD) WITH PROPOFOL N/A 08/19/2018   Procedure: ESOPHAGOGASTRODUODENOSCOPY (EGD) WITH PROPOFOL;  Surgeon: Charlott Rakes, MD;  Location: [facility]  ENDOSCOPY;  Service: Endoscopy;  Laterality: N/A;   LAPAROSCOPIC APPENDECTOMY N/A 02/28/2017   Procedure: APPENDECTOMY LAPAROSCOPIC;  Surgeon: Harriette Bouillon, MD;  Location: [facility];  Service: General;  Laterality: N/A;   LAPAROSCOPIC ENDOMETRIOSIS FULGURATION     LAPAROSCOPIC GASTRIC SLEEVE RESECTION N/A 07/15/2018   Procedure: LAPAROSCOPIC GASTRIC SLEEVE RESECTION, UPPER ENDO, ERAS Pathway;  Surgeon: Luretha Demartin, MD;  Location: [facility];  Service: General;  Laterality: N/A;   LUMBAR LAMINECTOMY     LUMBAR LAMINECTOMY/DECOMPRESSION MICRODISCECTOMY Left 06/22/2016   Procedure: MICRODISCECTOMY LEFT LUMBAR FOUR-FIVE;  Surgeon: Lisbeth Renshaw, MD;  Location: [facility];  Service: Neurosurgery;  Laterality: Left;   REDUCTION MAMMAPLASTY Bilateral    ROUX-EN-Y PROCEDURE  04/1999   Patient Active Problem List   Diagnosis Date Noted   Malignant neoplasm of upper-outer quadrant of right breast in female, estrogen receptor positive (HCC) 11/21/2023   Chronic abdominal pain 10/01/2023   RUQ abdominal pain 09/19/2023   Gastrojejunal (GJ) tube in place [facility]) 09/19/2023   Chronic diarrhea 09/19/2023   Diarrhea due to drug 09/19/2023   Nausea and vomiting 07/17/2023   LUQ abdominal pain 07/16/2023   Acute non-recurrent maxillary sinusitis 04/19/2023   Pre-op exam 02/27/2023   History of Cushing's syndrome 02/27/2023   Wrongful diagnosis of von Willebrand's disease 02/27/2023   Intractable nausea and vomiting 01/02/2023   Hypocalcemia 01/02/2023   Encounter to establish care with new doctor  12/16/2022   Low back pain 12/16/2022   Allergic reaction to bee sting 03/16/2022   Chronic right shoulder pain 03/16/2022   Gastroparesis 03/16/2022   History of pancreatitis 03/16/2022   History of hysterectomy 03/16/2022   Mild intermittent asthma 03/16/2022   Iron deficiency anemia 03/16/2022   Insomnia 03/16/2022   History of shingles 03/16/2022   Anxiety 04/07/2019   Palpitations 12/05/2018    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 07/15/2018   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   S/P lumbar fusion 10/09/2016   Arthropathy of lumbar facet joint 07/30/2016   HNP (herniated nucleus pulposus), lumbar 06/22/2016   Hypomagnesemia 11/25/2013   Anemia of chronic disease 11/23/2013   Endometriosis 09/01/2013   Celiac disease 09/01/2013   Asthma, chronic 09/01/2013   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   Abnormal LFTs 07/15/2012   POTS (postural orthostatic tachycardia syndrome) 11/26/2011    PCP: Fanny Bien, MD  REFERRING PROVIDER: Abigail Miyamoto, MD  REFERRING DIAG: R breast cancer  THERAPY DIAG:  Abnormal posture - Plan: PT plan of care cert/re-cert  Malignant neoplasm of right breast in female, estrogen receptor positive, unspecified site of breast (HCC) - Plan: PT plan of care cert/re-cert  Rationale for Evaluation and Treatment: Rehabilitation  ONSET DATE: 11/12/23  SUBJECTIVE:                                                                                                                                                                                           SUBJECTIVE STATEMENT: Patient reports she is here today to be seen by her medical team for her newly diagnosed right breast cancer.   PERTINENT HISTORY:  Patient was diagnosed on 11/12/23 with right grade 2. It measures 0.7 cm. It is ER/PR+, HER2- with a Ki67 of 10%. Hx of superior mesenteric artery syndrome with duodenum jejunostomy July 2001, ruptured cervical disc 2015, Cushing's disease, POTS, celiac, Lumbosacral disc fusion on 06/19/2022, has gastroparesis has gastrostomy tube  PATIENT GOALS:   reduce lymphedema risk and learn post op HEP.   PAIN:  Are you having pain? No no pain just discomfort in area of biopsy  PRECAUTIONS: Active CA Other: POTS, g tube  RED FLAGS: None   HAND DOMINANCE: left but does everything else with the right hand  WEIGHT BEARING RESTRICTIONS:  No  FALLS:  Has patient fallen in last 6 months? No  LIVING ENVIRONMENT: Patient lives with: husband, twins 72 year olds Lives in: House/apartment Has following equipment at home: Dan Humphreys - 2 wheeled and shower chair  OCCUPATION: stay at home mom, was a paramedic for 20 years  LEISURE: bowflex max trainer 4x/wk for 10-15 min,  does 5 min arm exercises with 5lb hand weights  PRIOR LEVEL OF FUNCTION: Independent   OBJECTIVE: Note: Objective measures were completed at Evaluation unless otherwise noted.  COGNITION: Overall cognitive status: Within functional limits for tasks assessed    POSTURE:  Forward head and rounded shoulders posture  UPPER EXTREMITY AROM/PROM:  A/PROM RIGHT   eval   Shoulder extension 81  Shoulder flexion 164  Shoulder abduction 171  Shoulder internal rotation 78  Shoulder external rotation 87    (Blank rows = not tested)  A/PROM LEFT   eval  Shoulder extension 75  Shoulder flexion 167  Shoulder abduction 170  Shoulder internal rotation 71  Shoulder external rotation 86    (Blank rows = not tested)  CERVICAL AROM: All within normal limits:    Percent limited  Flexion WFL  Extension WFL  Right lateral flexion WFL  Left lateral flexion WFL  Right rotation WFL  Left rotation WFL    UPPER EXTREMITY STRENGTH: 5/5  LYMPHEDEMA ASSESSMENTS (in cm):   LANDMARK RIGHT   eval  10 cm proximal to olecranon process 31  Olecranon process 27.4  10 cm proximal to ulnar styloid process 21.9  Just proximal to ulnar styloid process 16.5  Across hand at thumb web space 19.5  At base of 2nd digit 6.6  (Blank rows = not tested)  LANDMARK LEFT   eval  10 cm proximal to olecranon process 33.5  Olecranon process 27.9  10 cm proximal to ulnar styloid process 23.2  Just proximal to ulnar styloid process 16.7  Across hand at thumb web space 20  At base of 2nd digit 6.7  (Blank rows = not tested)  L-DEX LYMPHEDEMA SCREENING:  The patient was  assessed using the L-Dex machine today to produce a lymphedema index baseline score. The patient will be reassessed on a regular basis (typically every 3 months) to obtain new L-Dex scores. If the score is > 6.5 points away from his/her baseline score indicating onset of subclinical lymphedema, it will be recommended to wear a compression garment for 4 weeks, 12 hours per day and then be reassessed. If the score continues to be > 6.5 points from baseline at reassessment, we will initiate lymphedema treatment. Assessing in this manner has a 95% rate of preventing clinically significant lymphedema.   L-DEX FLOWSHEETS - 11/22/23 0800       L-DEX LYMPHEDEMA SCREENING   Measurement Type Unilateral    L-DEX MEASUREMENT EXTREMITY Upper Extremity    POSITION  Standing    DOMINANT SIDE Left    At Risk Side Right    BASELINE SCORE (UNILATERAL) 2.6             QUICK DASH SURVEY:  Neldon Mc - 11/22/23 0001     Open a tight or new jar No difficulty    Do heavy household chores (wash walls, wash floors) No difficulty    Carry a shopping bag or briefcase No difficulty    Wash your back No difficulty    Use a knife to cut food No difficulty    Recreational activities in which you take some force or impact through your arm, shoulder, or hand (golf, hammering, tennis) No difficulty    During the past week, to what extent has your arm, shoulder or hand problem interfered with your normal social activities with family, friends, neighbors, or groups? Not at all    During the past week, to what extent has your arm, shoulder or hand problem  limited your work or other regular daily activities Not at all    Arm, shoulder, or hand pain. None    Tingling (pins and needles) in your arm, shoulder, or hand None    Difficulty Sleeping No difficulty    DASH Score 0 %              PATIENT EDUCATION:  Education details: Lymphedema risk reduction and post op shoulder/posture HEP Person educated:  Patient Education method: Explanation, Demonstration, Handout Education comprehension: Patient verbalized understanding and returned demonstration  HOME EXERCISE PROGRAM: Patient was instructed today in a home exercise program today for post op shoulder range of motion. These included active assist shoulder flexion in sitting, scapular retraction, wall walking with shoulder abduction, and hands behind head external rotation.  She was encouraged to do these twice a day, holding 3 seconds and repeating 5 times when permitted by her physician.   ASSESSMENT:  CLINICAL IMPRESSION:  Pt reports to PT with recently diagnosed R bresat cancer. She plans on undergoing a bilateral mastectomy and SLNB. She has not met the oncologist yet so she is unsure if she will require chemo. She does not have a surgery date scheduled yet. She will benefit from a post op PT reassessment to determine needs and from L-Dex screens every 3 months for 2 years to detect subclinical lymphedema.  Pt will benefit from skilled therapeutic intervention to improve on the following deficits: Decreased knowledge of precautions, impaired UE functional use, pain, decreased ROM, postural dysfunction.   PT treatment/interventions: ADL/self-care home management, pt/family education, therapeutic exercise  REHAB POTENTIAL: Good  CLINICAL DECISION MAKING: Stable/uncomplicated  EVALUATION COMPLEXITY: Low   GOALS: Goals reviewed with patient? YES  LONG TERM GOALS: (STG=LTG)    Name Target Date Goal status  1 Pt will be able to verbalize understanding of pertinent lymphedema risk reduction practices relevant to her dx specifically related to skin care.  Baseline:  No knowledge 11/22/2023 Achieved at eval  2 Pt will be able to return demo and/or verbalize understanding of the post op HEP related to regaining shoulder ROM. Baseline:  No knowledge 11/22/2023 Achieved at eval  3 Pt will be able to verbalize understanding of the importance  of attending the post op After Breast CA Class for further lymphedema risk reduction education and therapeutic exercise.  Baseline:  No knowledge 11/22/2023 Achieved at eval  4 Pt will demo she has regained full shoulder ROM and function post operatively compared to baselines.  Baseline: See objective measurements taken today. 01/03/24 NEW    PLAN:  PT FREQUENCY/DURATION: EVAL and 1 follow up appointment.   PLAN FOR NEXT SESSION: will reassess 3-4 weeks post op to determine needs.   Patient will follow up at outpatient cancer rehab 3-4 weeks following surgery.  If the patient requires physical therapy at that time, a specific plan will be dictated and sent to the referring physician for approval. The patient was educated today on appropriate basic range of motion exercises to begin post operatively and the importance of attending the After Breast Cancer class following surgery.  Patient was educated today on lymphedema risk reduction practices as it pertains to recommendations that will benefit the patient immediately following surgery.  She verbalized good understanding.    Physical Therapy Information for After Breast Cancer Surgery/Treatment:  Lymphedema is a swelling condition that you may be at risk for in your arm if you have lymph nodes removed from the armpit area.  After a sentinel node biopsy, the  risk is approximately 5-9% and is higher after an axillary node dissection.  There is treatment available for this condition and it is not life-threatening.  Contact your physician or physical therapist with concerns. You may begin the 4 shoulder/posture exercises (see additional sheet) when permitted by your physician (typically a week after surgery).  If you have drains, you may need to wait until those are removed before beginning range of motion exercises.  A general recommendation is to not lift your arms above shoulder height until drains are removed.  These exercises should be done to your  tolerance and gently.  This is not a "no pain/no gain" type of recovery so listen to your body and stretch into the range of motion that you can tolerate, stopping if you have pain.  If you are having immediate reconstruction, ask your plastic surgeon about doing exercises as he or she may want you to wait. We encourage you to attend the free one time ABC (After Breast Cancer) class offered by [facility].  You will learn information related to lymphedema risk, prevention and treatment and additional exercises to regain mobility following surgery.  You can call [phone] for more information.  This is offered the 1st and 3rd Monday of each month.  You only attend the class one time. While undergoing any medical procedure or treatment, try to avoid blood pressure being taken or needle sticks from occurring on the arm on the side of cancer.   This recommendation begins after surgery and continues for the rest of your life.  This may help reduce your risk of getting lymphedema (swelling in your arm). An excellent resource for those seeking information on lymphedema is the National Lymphedema Network's web site. It can be accessed at www.lymphnet.org If you notice swelling in your hand, arm or breast at any time following surgery (even if it is many years from now), please contact your doctor or physical therapist to discuss this.  Lymphedema can be treated at any time but it is easier for you if it is treated early on.  If you feel like your shoulder motion is not returning to normal in a reasonable amount of time, please contact your surgeon or physical therapist.  [facility] [phone]. [address], [place] [address]  ABC CLASS After Breast Cancer Class  After Breast Cancer Class is a specially designed exercise class to assist you in a safe recover after having breast cancer surgery.  In this class you will learn how to get  back to full function whether your drains were just removed or if you had surgery a month ago.  This one-time class is held the 1st and 3rd Monday of every month from 11:00 a.m. until 12:00 noon virtually.  This class is FREE and space is limited. For more information or to register for the next available class, call [phone].  Class Goals  Understand specific stretches to improve the flexibility of you chest and shoulder. Learn ways to safely strengthen your upper body and improve your posture. Understand the warning signs of infection and why you may be at risk for an arm infection. Learn about Lymphedema and prevention.  ** You do not attend this class until after surgery.  Drains must be removed to participate  Patient was instructed today in a home exercise program today for post op shoulder range of motion. These included active assist shoulder flexion in sitting, scapular retraction, wall  walking with shoulder abduction, and hands behind head external rotation.  She was encouraged to do these twice a day, holding 3 seconds and repeating 5 times when permitted by her physician.    [facility] [address], PT 11/22/2023, 9:02 AM

## 2023-11-22 NOTE — Telephone Encounter (Signed)
Patient was called for friendly reminder of genetic counseling appt on 11/27/2023 11am. Saw where patient has new patient appt on 11/25/2023 with labs and asked if she had hard time being stuck. She said actually she is and would be good if blood work for genetics could be drawn on 1/27 and she confirmed her 11am consultation with genetic counselor. She understands sample will not be sent out until her consultation and her approval to proceed. Thank you

## 2023-11-23 DIAGNOSIS — K3184 Gastroparesis: Secondary | ICD-10-CM | POA: Diagnosis not present

## 2023-11-24 DIAGNOSIS — K3184 Gastroparesis: Secondary | ICD-10-CM | POA: Diagnosis not present

## 2023-11-25 ENCOUNTER — Inpatient Hospital Stay: Payer: BC Managed Care – PPO | Attending: Hematology and Oncology | Admitting: Hematology and Oncology

## 2023-11-25 ENCOUNTER — Encounter: Payer: Self-pay | Admitting: Hematology and Oncology

## 2023-11-25 ENCOUNTER — Inpatient Hospital Stay: Payer: BC Managed Care – PPO

## 2023-11-25 VITALS — BP 119/78 | HR 64 | Temp 98.1°F | Resp 16 | Ht 69.02 in | Wt 209.3 lb

## 2023-11-25 DIAGNOSIS — Z170 Estrogen receptor positive status [ER+]: Secondary | ICD-10-CM | POA: Insufficient documentation

## 2023-11-25 DIAGNOSIS — Z1721 Progesterone receptor positive status: Secondary | ICD-10-CM | POA: Insufficient documentation

## 2023-11-25 DIAGNOSIS — Z9884 Bariatric surgery status: Secondary | ICD-10-CM | POA: Insufficient documentation

## 2023-11-25 DIAGNOSIS — Z1732 Human epidermal growth factor receptor 2 negative status: Secondary | ICD-10-CM | POA: Insufficient documentation

## 2023-11-25 DIAGNOSIS — G90A Postural orthostatic tachycardia syndrome (POTS): Secondary | ICD-10-CM | POA: Insufficient documentation

## 2023-11-25 DIAGNOSIS — C50411 Malignant neoplasm of upper-outer quadrant of right female breast: Secondary | ICD-10-CM | POA: Insufficient documentation

## 2023-11-25 DIAGNOSIS — Z90710 Acquired absence of both cervix and uterus: Secondary | ICD-10-CM | POA: Diagnosis not present

## 2023-11-25 DIAGNOSIS — K3184 Gastroparesis: Secondary | ICD-10-CM | POA: Diagnosis not present

## 2023-11-25 DIAGNOSIS — Z79899 Other long term (current) drug therapy: Secondary | ICD-10-CM | POA: Insufficient documentation

## 2023-11-25 DIAGNOSIS — C50911 Malignant neoplasm of unspecified site of right female breast: Secondary | ICD-10-CM | POA: Diagnosis not present

## 2023-11-25 LAB — GENETIC SCREENING ORDER

## 2023-11-25 NOTE — Progress Notes (Signed)
[name] CONSULT NOTE  Patient Care Team: Garnette Gunner, MD as PCP - General (Family Medicine) Yates Decamp, MD as PCP - Cardiology (Cardiology)  CHIEF COMPLAINTS/PURPOSE OF CONSULTATION:  New onset breast cancer  ASSESSMENT & PLAN:  Malignant neoplasm of upper-outer quadrant of right breast in female, estrogen receptor positive (HCC) Invasive Ductal Carcinoma of right breast Diagnosed via biopsy following mammogram showing 9mm area of distortion and a small area of non enhancement. Grade 2, ER/PR positive, HER2 negative, KI67 10%. Discussed the nature of the cancer, grading, and receptor status. Discussed potential treatment options including tamoxifen and aromatase inhibitors, and the potential need for chemotherapy depending on Oncotype DX results.   -Order Oncotype DX to determine need for chemotherapy.   -Plan for genetic testing today.   -Discuss results and finalize treatment plan in approximately 3.5 weeks after surgery.    Gastroparesis   History of gastroparesis, currently managed with feeding tube. Discussed potential challenges with chemotherapy due to nausea.    Family History   No family history of breast cancer. Maternal grandmother had colorectal cancer in her 28s, maternal uncle had thyroid cancer in his 26s.   -Genetic testing ordered to assess for hereditary cancer risk given young age at diagnosis.  Postural Orthostatic Tachycardia Syndrome (POTS)   Managed with Sectral (acebutolol) and Corlanor (ivabradine).   -Continue current management.    History of Breast Reduction   Performed 4 years ago.   -No current issues related to this procedure.    History of Hysterectomy   Due to endometriosis, previously managed with Zoladex and Depo-Provera.   -No current issues related to this procedure.  She may be a candidate for OFSET trial depending on her oncotype results She is interested to learn more about it.  Orders Placed This Encounter   Procedures   Genetic Screening Order    Standing Status:   Future    Number of Occurrences:   1    Expected Date:   11/25/2023    Expiration Date:   11/24/2024     HISTORY OF PRESENTING ILLNESS:  Kaitlyn Johnson 43 y.o. female is here because of new onset breast cancer  Discussed the use of AI scribe software for clinical note transcription with the patient, who gave verbal consent to proceed.  History of Present Illness    The patient, a 43 year old with a history of Postural Orthostatic Tachycardia Syndrome (POTS), gastroparesis, and superior mesenteric artery syndrome, presents for consultation following a recent breast biopsy. The patient has a history of breast reduction surgery four years ago and has had two mammograms, the most recent of which revealed an area of distortion. She had endometriosis and was on some treatment including zoladex and Depo for several yrs and had hysterectomy.  The biopsy revealed invasive ductal cancer, grade two, with strong staining for estrogen and progesterone receptors, and no HER2 expression.  The patient has a history of multiple surgeries, including gastric bypass five years ago, and has two children born via surrogate using a donor egg and the patient's partner's sperm. The patient has a feeding tube due to gastroparesis and is unable to eat solid food without experiencing nausea and vomiting. 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  The patient is currently on Acebutolol and Corlanor for POTS, and uses inhalers. The patient has expressed  concerns about potential side effects of Tamoxifen, an antiestrogen medication, due to experiences of others known to the patient.  Rest of the pertinent 10 point ROS reviewed and neg.  MEDICAL HISTORY:  Past Medical History:  Diagnosis Date   Acute appendicitis  02/28/2017   Acute pancreatitis 11/30/2013   Anemia 11/26/2011   Anxiety    Asthma    Celiac and mesenteric artery injury    Cushing's syndrome (HCC)    06/21/16- "in remission"   Depression    H/O hiatal hernia    History of kidney stones    Iatrogenic Cushing's syndrome (HCC) 10/02/2013   Nausea vomiting and diarrhea 11/23/2013   Nausea with vomiting 11/30/2013   Palpitations 12/05/2018   Pancreatitis 11/29/2013   POTS (postural orthostatic tachycardia syndrome)    Shortness of breath dyspnea    with exertertion   Sphincter of Oddi dysfunction     SURGICAL HISTORY: Past Surgical History:  Procedure Laterality Date   ABDOMINAL HYSTERECTOMY  08/2010   ANTERIOR CERVICAL DECOMP/DISCECTOMY FUSION N/A 03/31/2013   Procedure: ANTERIOR CERVICAL DECOMPRESSION/DISCECTOMY FUSION 1 LEVEL Cervical five-six;  Surgeon: Reinaldo Meeker, MD;  Location: [facility];  Service: Neurosurgery;  Laterality: N/A;   APPENDECTOMY     BACK SURGERY     BREAST BIOPSY Right 11/12/2023   [place] RT BREAST BX W LOC DEV 1ST LESION IMG BX SPEC US GUIDE 11/12/2023 [facility]   [place]-Pancreatography with Spintectorotomy + Stent  07/16/2012   01/15/14   CHOLECYSTECTOMY     ESOPHAGOGASTRODUODENOSCOPY (EGD) WITH PROPOFOL N/A 08/19/2018   Procedure: ESOPHAGOGASTRODUODENOSCOPY (EGD) WITH PROPOFOL;  Surgeon: Charlott Rakes, MD;  Location: [facility];  Service: Endoscopy;  Laterality: N/A;   LAPAROSCOPIC APPENDECTOMY N/A 02/28/2017   Procedure: APPENDECTOMY LAPAROSCOPIC;  Surgeon: Harriette Bouillon, MD;  Location: [facility];  Service: General;  Laterality: N/A;   LAPAROSCOPIC ENDOMETRIOSIS FULGURATION     LAPAROSCOPIC GASTRIC SLEEVE RESECTION N/A 07/15/2018   Procedure: LAPAROSCOPIC GASTRIC SLEEVE RESECTION, UPPER ENDO, ERAS Pathway;  Surgeon: Luretha Kotula, MD;  Location: [facility];  Service: General;  Laterality: N/A;   LUMBAR LAMINECTOMY     LUMBAR LAMINECTOMY/DECOMPRESSION MICRODISCECTOMY Left 06/22/2016    Procedure: MICRODISCECTOMY LEFT LUMBAR FOUR-FIVE;  Surgeon: Lisbeth Renshaw, MD;  Location: [facility];  Service: Neurosurgery;  Laterality: Left;   REDUCTION MAMMAPLASTY Bilateral    ROUX-EN-Y PROCEDURE  04/1999    SOCIAL HISTORY: Social History   Socioeconomic History   Marital status: Married    Spouse name: Not on file   Number of children: 2   Years of education: Not on file   Highest education level: Associate degree: academic program  Occupational History   Not on file  Tobacco Use   Smoking status: Never    Passive exposure: Never   Smokeless tobacco: Never  Vaping Use   Vaping status: Never Used  Substance and Sexual Activity   Alcohol use: Yes    Alcohol/week: 5.0 standard drinks of alcohol    Types: 5 Glasses of wine per week    Comment: OCC   Drug use: No   Sexual activity: Yes    Partners: Male    Birth control/protection: Surgical  Other Topics Concern   Not on file  Social History Narrative   Regular exercise: can't at this time   Caffeine use: no   Social Drivers of Corporate investment banker Strain: Low Risk  (10/01/2023)   Overall Financial Resource Strain (CARDIA)    Difficulty of Paying Living Expenses:  Not hard at all  Food Insecurity: No Food Insecurity (10/01/2023)   Hunger Vital Sign    Worried About Running Out of Food in the Last Year: Never true    Ran Out of Food in the Last Year: Never true  Transportation Needs: No Transportation Needs (10/01/2023)   PRAPARE - Administrator, Civil Service (Medical): No    Lack of Transportation (Non-Medical): No  Physical Activity: Unknown (10/01/2023)   Exercise Vital Sign    Days of Exercise per Week: 0 days    Minutes of Exercise per Session: Not on file  Stress: No Stress Concern Present (10/01/2023)   [employer] of Occupational Health - Occupational Stress Questionnaire    Feeling of Stress : Not at all  Social Connections: Moderately Integrated (10/01/2023)   Social  Connection and Isolation Panel [NHANES]    Frequency of Communication with Friends and Family: More than three times a week    Frequency of Social Gatherings with Friends and Family: Twice a week    Attends Religious Services: More than 4 times per year    Active Member of [employer] or Organizations: No    Attends Engineer, structural: Not on file    Marital Status: Married  Catering manager Violence: Not At Risk (07/17/2023)   Humiliation, Afraid, Rape, and Kick questionnaire    Fear of Current or Ex-Partner: No    Emotionally Abused: No    Physically Abused: No    Sexually Abused: No    FAMILY HISTORY: Family History  Problem Relation Age of Onset   Hypertension Mother     ALLERGIES:  is allergic to bee venom; reglan [metoclopramide]; shellfish allergy; adhesive [tape]; measles, mumps & rubella vac; sulfa antibiotics; and yellow dye.  MEDICATIONS:  Current Outpatient Medications  Medication Sig Dispense Refill   ABILIFY MAINTENA 400 MG SRER injection Inject 400 mg into the muscle every 28 (twenty-eight) days.     acebutolol (SECTRAL) 200 MG capsule TAKE ONE CAPSULE BY MOUTH TWICE DAILY 180 capsule 3   acetaminophen (TYLENOL) 500 MG tablet Take 2 tablets (1,000 mg total) by mouth every 8 (eight) hours. 30 tablet 0   albuterol (VENTOLIN HFA) 108 (90 Base) MCG/ACT inhaler Inhale 1 puff into the lungs as needed for wheezing or shortness of breath.     AMBIEN CR 12.5 MG CR tablet Take 12.5 mg by mouth at bedtime.  1   ARIPiprazole ER (ABILIFY MAINTENA) 400 MG PRSY prefilled syringe Inject 400 mg into the muscle every 28 (twenty-eight) days. 3 each 1   busPIRone (BUSPAR) 10 MG tablet Take 20 mg by mouth 3 (three) times daily.     clonazePAM (KLONOPIN) 0.5 MG tablet Take 0.5 mg by mouth 2 (two) times daily as needed for anxiety.     cyanocobalamin (VITAMIN B12) 1000 MCG/ML injection Inject 1,000 mcg into the skin every 30 (thirty) days.     desipramine (NORPRAMIN) 25 MG tablet Take  25 mg by mouth at bedtime.     DEXILANT 30 MG capsule Take 30 mg by mouth 2 (two) times daily.     dicyclomine (BENTYL) 10 MG capsule Take 1 capsule (10 mg total) by mouth 3 (three) times daily as needed for spasms (Pain/ spasms).     diphenoxylate-atropine (LOMOTIL) 2.5-0.025 MG tablet Take 1 tablet by mouth 4 (four) times daily as needed for diarrhea or loose stools.     EPINEPHrine 0.3 mg/0.3 mL IJ SOAJ injection Inject 0.3 mg into the muscle  as needed for anaphylaxis. 1 each 3   ipratropium (ATROVENT) 0.03 % nasal spray Place 2 sprays into both nostrils every 12 (twelve) hours. 30 mL 12   ivabradine (CORLANOR) 7.5 MG TABS tablet Take 1 tablet (7.5 mg total) by mouth 2 (two) times daily with a meal. 180 tablet 3   lamoTRIgine (LAMICTAL) 100 MG tablet Take 100 mg by mouth at bedtime.     levalbuterol (XOPENEX HFA) 45 MCG/ACT inhaler Inhale 1 puff into the lungs as needed for wheezing or shortness of breath.     levalbuterol (XOPENEX) 0.63 MG/3ML nebulizer solution Take 0.63 mg by nebulization every 6 (six) hours as needed for wheezing or shortness of breath.     montelukast (SINGULAIR) 10 MG tablet Take 1 tablet (10 mg total) by mouth at bedtime. 30 tablet 3   ondansetron (ZOFRAN-ODT) 4 MG disintegrating tablet Take 1 tablet (4 mg total) by mouth every 8 (eight) hours as needed for nausea or vomiting. 20 tablet 0   traZODone (DESYREL) 100 MG tablet Take 100-300 mg by mouth at bedtime as needed for sleep.     No current facility-administered medications for this visit.     PHYSICAL EXAMINATION: ECOG PERFORMANCE STATUS: 0 - Asymptomatic  Vitals:   11/25/23 0913  BP: 119/78  Pulse: 64  Resp: 16  Temp: 98.1 F (36.7 C)  SpO2: 100%   Filed Weights   11/25/23 0913  Weight: 209 lb 4.8 oz (94.9 kg)    GENERAL:alert, no distress and comfortable SKIN: skin color, texture, turgor are normal, no rashes or significant lesions EYES: normal, conjunctiva are pink and non-injected, sclera  clear OROPHARYNX:no exudate, no erythema and lips, buccal mucosa, and tongue normal  NECK: supple, thyroid normal size, non-tender, without nodularity LYMPH:  no palpable lymphadenopathy in the cervical, axillary LUNGS: clear to auscultation and percussion with normal breathing effort HEART: regular rate & rhythm and no murmurs and no lower extremity edema ABDOMEN:abdomen soft, non-tender and normal bowel sounds Musculoskeletal:no cyanosis of digits and no clubbing  PSYCH: alert & oriented x 3 with fluent speech NEURO: no focal motor/sensory deficits  LABORATORY DATA:  I have reviewed the data as listed Lab Results  Component Value Date   WBC 7.5 09/17/2023   HGB 12.5 09/17/2023   HCT 38.4 09/17/2023   MCV 91.6 09/17/2023   PLT 263.0 09/17/2023     Chemistry      Component Value Date/Time   NA 138 09/17/2023 1430   NA 140 07/28/2019 1520   NA 138 09/07/2013 1409   K 4.2 09/17/2023 1430   K 3.7 09/07/2013 1409   CL 102 09/17/2023 1430   CL 102 11/11/2012 1324   CO2 25 09/17/2023 1430   CO2 23 09/07/2013 1409   BUN 14 09/17/2023 1430   BUN 5 (L) 07/28/2019 1520   BUN 12.4 09/07/2013 1409   CREATININE 0.66 09/17/2023 1430   CREATININE 0.81 07/06/2022 1539   CREATININE 0.8 09/07/2013 1409      Component Value Date/Time   CALCIUM 9.5 09/17/2023 1430   CALCIUM 9.4 09/07/2013 1409   ALKPHOS 97 09/17/2023 1430   ALKPHOS 81 09/07/2013 1409   AST 16 09/17/2023 1430   AST 14 09/07/2013 1409   ALT 21 09/17/2023 1430   ALT 28 09/07/2013 1409   BILITOT 0.3 09/17/2023 1430   BILITOT <0.20 09/07/2013 1409       RADIOGRAPHIC STUDIES: I have personally reviewed the radiological images as listed and agreed with the findings in the  report. [place] RT BREAST BX W LOC DEV 1ST LESION IMG BX SPEC US GUIDE Addendum Date: 11/14/2023 ADDENDUM REPORT: 11/14/2023 07:46 ADDENDUM: Pathology revealed GRADE II INVASIVE DUCTAL CARCINOMA, DUCTAL CARCINOMA IN SITU WITH NECROSIS AND CALCIFICATIONS of  the RIGHT breast, 10 o'clock, 2 cmfn, (heart clip) . This was found to be concordant by Dr. Meda Klinefelter. Pathology results were discussed with the patient by telephone. The patient reported doing well after the biopsy with tenderness at the site. Post biopsy instructions and care were reviewed and questions were answered. The patient was encouraged to call [facility] for any additional concerns. My direct phone number was provided. Surgical consultation has been arranged with Dr. Carman Ching, per patient request, at [facility] on November 20, 2023. Recommendation for a bilateral breast MRI given age and heterogeneously dense breast tissue, (C), which may obscure small masses. Pathology results reported by Rene Kocher, RN on 11/14/2023. Electronically Signed   By: Meda Klinefelter M.D.   On: 11/14/2023 07:46   Result Date: 11/14/2023 CLINICAL DATA:  Indeterminate RIGHT breast distortion EXAM: ULTRASOUND GUIDED RIGHT BREAST CORE NEEDLE BIOPSY COMPARISON:  Previous exam(s). PROCEDURE: I met with the patient and we discussed the procedure of ultrasound-guided biopsy, including benefits and alternatives. We discussed the high likelihood of a successful procedure. We discussed the risks of the procedure, including infection, bleeding, tissue injury, clip migration, and inadequate sampling. Informed written consent was given. The usual time-out protocol was performed immediately prior to the procedure. Lesion quadrant: Upper outer quadrant Using sterile technique and 1% Lidocaine as local anesthetic, under direct ultrasound visualization, a 12 gauge spring-loaded device was used to perform biopsy of area of distortion at 10 o'clock 2 cm from the nipple using a inferolateral approach. At the conclusion of the procedure a heart shaped tissue marker clip was deployed into the biopsy cavity. Follow up 2 view mammogram was performed and dictated separately. IMPRESSION:  Ultrasound guided biopsy of an area of distortion. No apparent complications. Electronically Signed: By: Meda Klinefelter M.D. On: 11/12/2023 13:33   MM CLIP PLACEMENT RIGHT Result Date: 11/12/2023 CLINICAL DATA:  Status post ultrasound-guided biopsy EXAM: 3D DIAGNOSTIC RIGHT MAMMOGRAM POST ULTRASOUND BIOPSY COMPARISON:  Previous exam(s). FINDINGS: 3D Mammographic images were obtained following ultrasound guided biopsy of an area of architectural distortion. The heart biopsy marking clip is in expected position at the site of biopsy. IMPRESSION: Appropriate positioning of the heart shaped biopsy marking clip at the site of biopsy in the upper breast. Final Assessment: Post Procedure Mammograms for Marker Placement Electronically Signed   By: Meda Klinefelter M.D.   On: 11/12/2023 13:31   MM 3D DIAGNOSTIC MAMMOGRAM UNILATERAL RIGHT BREAST Result Date: 11/12/2023 CLINICAL DATA:  RIGHT breast distortion callback. History of breast reduction in 2021. EXAM: DIGITAL DIAGNOSTIC UNILATERAL RIGHT MAMMOGRAM WITH TOMOSYNTHESIS AND CAD; ULTRASOUND RIGHT BREAST LIMITED TECHNIQUE: Right digital diagnostic mammography and breast tomosynthesis was performed. The images were evaluated with computer-aided detection. ; Targeted ultrasound examination of the right breast was performed COMPARISON:  Previous exam(s). ACR Breast Density Category c: The breasts are heterogeneously dense, which may obscure small masses. FINDINGS: Spot compression tomosynthesis views confirm an area of distortion in the upper breast at anterior depth. There is interdigitating fat, suggesting underlying postsurgical etiology. No additional suspicious findings are noted. On physical exam, postsurgical changes are noted. No suspicious mass is appreciated. Targeted ultrasound was performed of the RIGHT upper breast. At 10 o'clock 2 cm from the nipple,  there is an area of sonographic architectural distortion which is predominately planer in appearance  but a more focal fullness along the superficial margin. This area spans approximately 9 x 9 x 9 mm. More focal nodular non mass area along the superior margin spans approximately 7 x 3 mm. This corresponds well to the site of screening mammographic concern. Targeted ultrasound was performed the RIGHT axilla. No suspicious axillary lymph nodes are visualized. IMPRESSION: 1. At the site of screening mammographic concern, there is persistent architectural distortion. Sonographically, there is a small non mass area along the superficial margin of the favored sonographic correlate which may reflect fat necrosis. Overall this is favored to reflect postsurgical changes from patient's interval breast reduction. Option for short-term follow-up versus definitive characterization with biopsy was discussed with patient. Patient would prefer to proceed with definitive characterization at this point in time. As such, recommend ultrasound-guided biopsy for definitive characterization. 2. No suspicious RIGHT axillary adenopathy. RECOMMENDATION: RIGHT breast ultrasound-guided biopsy x1 I have discussed the findings and recommendations with the patient. The biopsy procedure was discussed with the patient and questions were answered. Patient expressed their understanding of the biopsy recommendation. Patient will be scheduled for biopsy at her earliest convenience by the schedulers. Ordering provider will be notified. If applicable, a reminder letter will be sent to the patient regarding the next appointment. BI-RADS CATEGORY  3: Probably benign. Electronically Signed   By: Meda Klinefelter M.D.   On: 11/12/2023 10:11   [place] LIMITED ULTRASOUND INCLUDING AXILLA RIGHT BREAST Result Date: 11/12/2023 CLINICAL DATA:  RIGHT breast distortion callback. History of breast reduction in 2021. EXAM: DIGITAL DIAGNOSTIC UNILATERAL RIGHT MAMMOGRAM WITH TOMOSYNTHESIS AND CAD; ULTRASOUND RIGHT BREAST LIMITED TECHNIQUE: Right digital diagnostic  mammography and breast tomosynthesis was performed. The images were evaluated with computer-aided detection. ; Targeted ultrasound examination of the right breast was performed COMPARISON:  Previous exam(s). ACR Breast Density Category c: The breasts are heterogeneously dense, which may obscure small masses. FINDINGS: Spot compression tomosynthesis views confirm an area of distortion in the upper breast at anterior depth. There is interdigitating fat, suggesting underlying postsurgical etiology. No additional suspicious findings are noted. On physical exam, postsurgical changes are noted. No suspicious mass is appreciated. Targeted ultrasound was performed of the RIGHT upper breast. At 10 o'clock 2 cm from the nipple, there is an area of sonographic architectural distortion which is predominately planer in appearance but a more focal fullness along the superficial margin. This area spans approximately 9 x 9 x 9 mm. More focal nodular non mass area along the superior margin spans approximately 7 x 3 mm. This corresponds well to the site of screening mammographic concern. Targeted ultrasound was performed the RIGHT axilla. No suspicious axillary lymph nodes are visualized. IMPRESSION: 1. At the site of screening mammographic concern, there is persistent architectural distortion. Sonographically, there is a small non mass area along the superficial margin of the favored sonographic correlate which may reflect fat necrosis. Overall this is favored to reflect postsurgical changes from patient's interval breast reduction. Option for short-term follow-up versus definitive characterization with biopsy was discussed with patient. Patient would prefer to proceed with definitive characterization at this point in time. As such, recommend ultrasound-guided biopsy for definitive characterization. 2. No suspicious RIGHT axillary adenopathy. RECOMMENDATION: RIGHT breast ultrasound-guided biopsy x1 I have discussed the findings and  recommendations with the patient. The biopsy procedure was discussed with the patient and questions were answered. Patient expressed their understanding of the biopsy recommendation. Patient will be  scheduled for biopsy at her earliest convenience by the schedulers. Ordering provider will be notified. If applicable, a reminder letter will be sent to the patient regarding the next appointment. BI-RADS CATEGORY  3: Probably benign. Electronically Signed   By: Meda Klinefelter M.D.   On: 11/12/2023 10:11   MM 3D SCREENING MAMMOGRAM BILATERAL BREAST Result Date: 11/01/2023 CLINICAL DATA:  Screening. EXAM: DIGITAL SCREENING BILATERAL MAMMOGRAM WITH TOMOSYNTHESIS AND CAD TECHNIQUE: Bilateral screening digital craniocaudal and mediolateral oblique mammograms were obtained. Bilateral screening digital breast tomosynthesis was performed. The images were evaluated with computer-aided detection. COMPARISON:  Previous exam(s). ACR Breast Density Category c: The breasts are heterogeneously dense, which may obscure small masses. FINDINGS: In the right breast, possible distortion warrants further evaluation. In the left breast, no findings suspicious for malignancy. IMPRESSION: Further evaluation is suggested for possible distortion in the right breast. RECOMMENDATION: Diagnostic mammogram and possibly ultrasound of the right breast. (Code:[id number]) The patient will be contacted regarding the findings, and additional imaging will be scheduled. BI-RADS CATEGORY  0: Incomplete: Need additional imaging evaluation. Electronically Signed   By: Elberta Fortis M.D.   On: 11/01/2023 12:17    All questions were answered. The patient knows to call the clinic with any problems, questions or concerns. I spent 45 minutes in the care of this patient including H and P, review of records, counseling and coordination of care.     Rachel Moulds, MD 11/25/2023 11:06 AM

## 2023-11-25 NOTE — Assessment & Plan Note (Signed)
Invasive Ductal Carcinoma of right breast Diagnosed via biopsy following mammogram showing 9mm area of distortion and a small area of non enhancement. Grade 2, ER/PR positive, HER2 negative, KI67 10%. Discussed the nature of the cancer, grading, and receptor status. Discussed potential treatment options including tamoxifen and aromatase inhibitors, and the potential need for chemotherapy depending on Oncotype DX results.   -Order Oncotype DX to determine need for chemotherapy.   -Plan for genetic testing today.   -Discuss results and finalize treatment plan in approximately 3.5 weeks after surgery.    Gastroparesis   History of gastroparesis, currently managed with feeding tube. Discussed potential challenges with chemotherapy due to nausea.    Family History   No family history of breast cancer. Maternal grandmother had colorectal cancer in her 59s, maternal uncle had thyroid cancer in his 89s.   -Genetic testing ordered to assess for hereditary cancer risk given young age at diagnosis.  Postural Orthostatic Tachycardia Syndrome (POTS)   Managed with Sectral (acebutolol) and Corlanor (ivabradine).   -Continue current management.    History of Breast Reduction   Performed 4 years ago.   -No current issues related to this procedure.    History of Hysterectomy   Due to endometriosis, previously managed with Zoladex and Depo-Provera.   -No current issues related to this procedure.  She may be a candidate for OFSET trial depending on her oncotype results She is interested to learn more about it.

## 2023-11-26 ENCOUNTER — Encounter: Payer: Self-pay | Admitting: *Deleted

## 2023-11-26 ENCOUNTER — Other Ambulatory Visit: Payer: Self-pay | Admitting: *Deleted

## 2023-11-26 ENCOUNTER — Encounter (HOSPITAL_BASED_OUTPATIENT_CLINIC_OR_DEPARTMENT_OTHER): Payer: Self-pay | Admitting: Surgery

## 2023-11-26 ENCOUNTER — Other Ambulatory Visit: Payer: Self-pay

## 2023-11-26 ENCOUNTER — Other Ambulatory Visit: Payer: Self-pay | Admitting: Surgery

## 2023-11-26 ENCOUNTER — Telehealth: Payer: Self-pay | Admitting: *Deleted

## 2023-11-26 DIAGNOSIS — Z170 Estrogen receptor positive status [ER+]: Secondary | ICD-10-CM

## 2023-11-26 DIAGNOSIS — K3184 Gastroparesis: Secondary | ICD-10-CM | POA: Diagnosis not present

## 2023-11-26 NOTE — Telephone Encounter (Signed)
Spoke to pt regarding navigation resources and provided contact information. Denies questions or concerns regarding dx or treatment care plan. Encourage pt to call with needs. Received verbal understanding.

## 2023-11-26 NOTE — Progress Notes (Signed)
   11/26/23 1138  Pre-op Phone Call  Surgery Date Verified 12/02/23  Arrival Time Verified 0645  Surgery Location Verified [facility]  Medical History Reviewed Yes  Is the patient taking a GLP-1 receptor agonist? No  Does the patient have diabetes? No diagnosis of diabetes  Do you have a history of heart problems? Yes  Cardiologist Name (S)  Dr Nadara Eaton last OV 10/17/23 followed for Dysautonomia orthostatic hypotension syndrome  Have you ever had tests on your heart? Yes  What cardiac tests were performed? (S)  Echo  What date/year were cardiac tests completed? (S)  Echo 09/07/22 EF 60-65%  Results viewable: [facility] Media Tab;Requested  Does patient have other implanted devices? No  Patient Teaching Enhanced Recovery;Pre / Post Procedure;Pre-op CHG Bathing  Patient educated about smoking cessation 24 hours prior to surgery. N/A Non-Smoker  Patient verbalizes understanding of bowel prep? N/A  Med Rec Completed Yes  Take the Following Meds the Morning of Surgery dexilant, bentyl, corlanor, norpramin, and klonopin  if needed. Bring overnight bag for RCC including all supplies to flush g tube/ feedings/ pump/ tubing etc.  Recent  Lab Work, EKG, CXR? (S)  Yes  NPO (Including gum & candy) (S)  After midnight (NO Tube feed p MN)  Allowed clear liquids Water;Gatorade  (diabetics please choose diet or no sugar options)  Patient instructed to stop clear liquids including Carb loading drink at: (S)  0530 (pt able to tolerate pills and clear liquids by mouth)  Stop Solids, Milk, Candy, and Gum STARTING AT MIDNIGHT  Responsible adult to drive and be with you for 24 hours? Yes  Name & Phone Number for Ride/Caregiver Onalee Hua husband  No Jewelry, money, nail polish or make-up.  No lotions, powders, perfumes. No shaving  48 hrs. prior to surgery. Yes  Contacts, Dentures & Glasses Will Have to be Removed Before OR. Yes  Please bring your ID and Insurance Card the morning of your surgery. (Surgery Centers Only) Yes   Bring any papers or x-rays with you that your surgeon gave you. Yes  Instructed to contact the location of procedure/ provider if they or anyone in their household develops symptoms or tests positive for COVID-19, has close contact with someone who tests positive for COVID, or has known exposure to any contagious illness. Yes  Call this number the morning of surgery  with any problems that may cancel your surgery. [phone]   Pt's past medical history, medications, cardiology notes and testing reviewed with Dr Isaias Cowman. Ok to proceed with surgery at [facility] as planned. Pt may take Corlanor on dos.

## 2023-11-27 ENCOUNTER — Inpatient Hospital Stay (HOSPITAL_BASED_OUTPATIENT_CLINIC_OR_DEPARTMENT_OTHER): Payer: BC Managed Care – PPO | Admitting: Genetic Counselor

## 2023-11-27 ENCOUNTER — Inpatient Hospital Stay: Payer: BC Managed Care – PPO

## 2023-11-27 ENCOUNTER — Other Ambulatory Visit: Payer: BC Managed Care – PPO

## 2023-11-27 ENCOUNTER — Other Ambulatory Visit: Payer: Self-pay | Admitting: *Deleted

## 2023-11-27 ENCOUNTER — Encounter: Payer: Self-pay | Admitting: Genetic Counselor

## 2023-11-27 DIAGNOSIS — Z800 Family history of malignant neoplasm of digestive organs: Secondary | ICD-10-CM | POA: Diagnosis not present

## 2023-11-27 DIAGNOSIS — Z79899 Other long term (current) drug therapy: Secondary | ICD-10-CM | POA: Diagnosis not present

## 2023-11-27 DIAGNOSIS — Z90710 Acquired absence of both cervix and uterus: Secondary | ICD-10-CM | POA: Diagnosis not present

## 2023-11-27 DIAGNOSIS — G90A Postural orthostatic tachycardia syndrome (POTS): Secondary | ICD-10-CM | POA: Diagnosis not present

## 2023-11-27 DIAGNOSIS — Z803 Family history of malignant neoplasm of breast: Secondary | ICD-10-CM | POA: Diagnosis not present

## 2023-11-27 DIAGNOSIS — Z170 Estrogen receptor positive status [ER+]: Secondary | ICD-10-CM

## 2023-11-27 DIAGNOSIS — Z1732 Human epidermal growth factor receptor 2 negative status: Secondary | ICD-10-CM | POA: Diagnosis not present

## 2023-11-27 DIAGNOSIS — Z1721 Progesterone receptor positive status: Secondary | ICD-10-CM | POA: Diagnosis not present

## 2023-11-27 DIAGNOSIS — C50411 Malignant neoplasm of upper-outer quadrant of right female breast: Secondary | ICD-10-CM

## 2023-11-27 DIAGNOSIS — K3184 Gastroparesis: Secondary | ICD-10-CM | POA: Diagnosis not present

## 2023-11-27 DIAGNOSIS — Z9884 Bariatric surgery status: Secondary | ICD-10-CM | POA: Diagnosis not present

## 2023-11-27 LAB — CBC WITH DIFFERENTIAL (CANCER CENTER ONLY)
Abs Immature Granulocytes: 0.02 10*3/uL (ref 0.00–0.07)
Basophils Absolute: 0 10*3/uL (ref 0.0–0.1)
Basophils Relative: 0 %
Eosinophils Absolute: 0.1 10*3/uL (ref 0.0–0.5)
Eosinophils Relative: 1 %
HCT: 36.3 % (ref 36.0–46.0)
Hemoglobin: 11.9 g/dL — ABNORMAL LOW (ref 12.0–15.0)
Immature Granulocytes: 0 %
Lymphocytes Relative: 20 %
Lymphs Abs: 1.5 10*3/uL (ref 0.7–4.0)
MCH: 29.7 pg (ref 26.0–34.0)
MCHC: 32.8 g/dL (ref 30.0–36.0)
MCV: 90.5 fL (ref 80.0–100.0)
Monocytes Absolute: 0.6 10*3/uL (ref 0.1–1.0)
Monocytes Relative: 7 %
Neutro Abs: 5.3 10*3/uL (ref 1.7–7.7)
Neutrophils Relative %: 72 %
Platelet Count: 255 10*3/uL (ref 150–400)
RBC: 4.01 MIL/uL (ref 3.87–5.11)
RDW: 12.7 % (ref 11.5–15.5)
WBC Count: 7.5 10*3/uL (ref 4.0–10.5)
nRBC: 0 % (ref 0.0–0.2)

## 2023-11-27 LAB — CMP (CANCER CENTER ONLY)
ALT: 16 U/L (ref 0–44)
AST: 18 U/L (ref 15–41)
Albumin: 4.2 g/dL (ref 3.5–5.0)
Alkaline Phosphatase: 64 U/L (ref 38–126)
Anion gap: 5 (ref 5–15)
BUN: 9 mg/dL (ref 6–20)
CO2: 28 mmol/L (ref 22–32)
Calcium: 9 mg/dL (ref 8.9–10.3)
Chloride: 105 mmol/L (ref 98–111)
Creatinine: 0.73 mg/dL (ref 0.44–1.00)
GFR, Estimated: >60 mL/min (ref >60)
Glucose, Bld: 92 mg/dL (ref 70–99)
Potassium: 4.1 mmol/L (ref 3.5–5.1)
Sodium: 138 mmol/L (ref 135–145)
Total Bilirubin: 0.3 mg/dL (ref 0.0–1.2)
Total Protein: 7.5 g/dL (ref 6.5–8.1)

## 2023-11-27 LAB — PROTIME-INR
INR: 0.9 (ref 0.8–1.2)
Prothrombin Time: 12.3 s (ref 11.4–15.2)

## 2023-11-27 LAB — APTT: aPTT: 25 s (ref 24–36)

## 2023-11-27 MED ORDER — ENSURE PRE-SURGERY PO LIQD: 296.0000 mL | Freq: Once | ORAL | Status: DC

## 2023-11-27 MED ORDER — CHLORHEXIDINE GLUCONATE CLOTH 2 % EX PADS
6.0000 | MEDICATED_PAD | Freq: Once | CUTANEOUS | Status: DC
Start: 2023-11-27 — End: 2023-12-03

## 2023-11-27 MED ORDER — CHLORHEXIDINE GLUCONATE CLOTH 2 % EX PADS: 6.0000 | MEDICATED_PAD | Freq: Once | CUTANEOUS | Status: DC

## 2023-11-27 NOTE — Progress Notes (Signed)
REFERRING PROVIDER: Abigail Miyamoto, MD [address] [place],  [place] [address]  PRIMARY PROVIDER:  Garnette Gunner, MD  PRIMARY REASON FOR VISIT:  1. Family history of breast cancer   2. Family history of colon cancer   3. Malignant neoplasm of upper-outer quadrant of right breast in female, estrogen receptor positive (HCC)      HISTORY OF PRESENT ILLNESS:   Kaitlyn Johnson, a 43 y.o. female, was seen for a [place] cancer genetics consultation at the request of Dr. Magnus Ivan due to a personal and family history of cancer.  Kaitlyn Johnson presents to clinic today to discuss the possibility of a hereditary predisposition to cancer, genetic testing, and to further clarify her future cancer risks, as well as potential cancer risks for family members.   In January 2025, at the age of 83, Kaitlyn Johnson was diagnosed with cancer of the upper-outer quadrant of the right breast. The treatment plan double mastectomy which is scheduled for December 02, 2023. She has a history of acute pancreatitis at age 12.    CANCER HISTORY:  Oncology History  Malignant neoplasm of upper-outer quadrant of right breast in female, estrogen receptor positive (HCC)  11/21/2023 Initial Diagnosis   Malignant neoplasm of upper-outer quadrant of right breast in female, estrogen receptor positive (HCC)   11/25/2023 Cancer Staging   Staging form: Breast, AJCC 8th Edition - Clinical: Stage IA (cT1b, cN0, cM0, G2, ER+, PR+, HER2-) - Signed by Rachel Moulds, MD on 11/25/2023 Histologic grading system: 3 grade system      RISK FACTORS:  Menarche was at age 33.  First live birth at age N/A.  OCP use for approximately 11 years.  Ovaries intact: left ovary.  Hysterectomy: yes.  Menopausal status: premenopausal.  HRT use: 0 years. Colonoscopy: yes; normal. Mammogram within the last year: yes. Number of breast biopsies: 1. Up to date with pelvic exams: yes. Any excessive radiation exposure in the past:  no  Past Medical History:  Diagnosis Date   Acute appendicitis 02/28/2017   Acute pancreatitis 11/30/2013   Anemia 11/26/2011   Anxiety    Asthma    Celiac and mesenteric artery injury    Cushing's syndrome (HCC)    06/21/16- "in remission"   Depression    Family history of breast cancer    Family history of colon cancer    H/O hiatal hernia    History of kidney stones    Iatrogenic Cushing's syndrome (HCC) 10/02/2013   Nausea vomiting and diarrhea 11/23/2013   Nausea with vomiting 11/30/2013   Palpitations 12/05/2018   Pancreatitis 11/29/2013   POTS (postural orthostatic tachycardia syndrome)    Shortness of breath dyspnea    with exertertion   Sphincter of Oddi dysfunction     Past Surgical History:  Procedure Laterality Date   ABDOMINAL HYSTERECTOMY  08/2010   ANTERIOR CERVICAL DECOMP/DISCECTOMY FUSION N/A 03/31/2013   Procedure: ANTERIOR CERVICAL DECOMPRESSION/DISCECTOMY FUSION 1 LEVEL Cervical five-six;  Surgeon: Reinaldo Meeker, MD;  Location: [facility];  Service: Neurosurgery;  Laterality: N/A;   APPENDECTOMY     BACK SURGERY     BREAST BIOPSY Right 11/12/2023   [place] RT BREAST BX W LOC DEV 1ST LESION IMG BX SPEC US GUIDE 11/12/2023 [facility]   [place]-Pancreatography with Spintectorotomy + Stent  07/16/2012   01/15/14   CHOLECYSTECTOMY     ESOPHAGOGASTRODUODENOSCOPY (EGD) WITH PROPOFOL N/A 08/19/2018   Procedure: ESOPHAGOGASTRODUODENOSCOPY (EGD) WITH PROPOFOL;  Surgeon: Charlott Rakes, MD;  Location:  [facility];  Service: Endoscopy;  Laterality: N/A;   LAPAROSCOPIC APPENDECTOMY N/A 02/28/2017   Procedure: APPENDECTOMY LAPAROSCOPIC;  Surgeon: Harriette Bouillon, MD;  Location: [facility];  Service: General;  Laterality: N/A;   LAPAROSCOPIC ENDOMETRIOSIS FULGURATION     LAPAROSCOPIC GASTRIC SLEEVE RESECTION N/A 07/15/2018   Procedure: LAPAROSCOPIC GASTRIC SLEEVE RESECTION, UPPER ENDO, ERAS Pathway;  Surgeon: Luretha Demery, MD;  Location: [facility];  Service:  General;  Laterality: N/A;   LUMBAR LAMINECTOMY     LUMBAR LAMINECTOMY/DECOMPRESSION MICRODISCECTOMY Left 06/22/2016   Procedure: MICRODISCECTOMY LEFT LUMBAR FOUR-FIVE;  Surgeon: Lisbeth Renshaw, MD;  Location: [facility];  Service: Neurosurgery;  Laterality: Left;   REDUCTION MAMMAPLASTY Bilateral    ROUX-EN-Y PROCEDURE  04/1999    Social History   Socioeconomic History   Marital status: Married    Spouse name: Not on file   Number of children: 2   Years of education: Not on file   Highest education level: Associate degree: academic program  Occupational History   Not on file  Tobacco Use   Smoking status: Never    Passive exposure: Never   Smokeless tobacco: Never  Vaping Use   Vaping status: Never Used  Substance and Sexual Activity   Alcohol use: Yes    Alcohol/week: 5.0 standard drinks of alcohol    Types: 5 Glasses of wine per week    Comment: OCC   Drug use: No   Sexual activity: Yes    Partners: Male    Birth control/protection: Surgical  Other Topics Concern   Not on file  Social History Narrative   Regular exercise: can't at this time   Caffeine use: no   Social Drivers of Corporate investment banker Strain: Low Risk  (10/01/2023)   Overall Financial Resource Strain (CARDIA)    Difficulty of Paying Living Expenses: Not hard at all  Food Insecurity: No Food Insecurity (10/01/2023)   Hunger Vital Sign    Worried About Running Out of Food in the Last Year: Never true    Ran Out of Food in the Last Year: Never true  Transportation Needs: No Transportation Needs (10/01/2023)   PRAPARE - Administrator, Civil Service (Medical): No    Lack of Transportation (Non-Medical): No  Physical Activity: Unknown (10/01/2023)   Exercise Vital Sign    Days of Exercise per Week: 0 days    Minutes of Exercise per Session: Not on file  Stress: No Stress Concern Present (10/01/2023)   [employer] of Occupational Health - Occupational Stress Questionnaire     Feeling of Stress : Not at all  Social Connections: Moderately Integrated (10/01/2023)   Social Connection and Isolation Panel [NHANES]    Frequency of Communication with Friends and Family: More than three times a week    Frequency of Social Gatherings with Friends and Family: Twice a week    Attends Religious Services: More than 4 times per year    Active Member of [employer] or Organizations: No    Attends Engineer, structural: Not on file    Marital Status: Married     FAMILY HISTORY:  We obtained a detailed, 4-generation family history.  Significant diagnoses are listed below: Family History  Problem Relation Age of Onset   Hypertension Mother    Thyroid cancer Maternal Uncle        dx 49s   Stroke Paternal Aunt    Colon cancer Maternal Grandmother        dx 16s  Heart attack Maternal Grandfather    Aortic aneurysm Paternal Grandfather    Breast cancer Other        MGMs sister, d. 5s     The patient has a set of twins that were conceived by donor egg and are healthy.  She is an only child.  Both parents are living.  The patient's father had one sister who died of a stroke.  His mother is living and his father is deceased.  The patient's mother has two sisters and a brother.  Her brother had thyroid cancer in her 35's.  Her mother had colon cancer in her 35's and a maternal aunt had breast cancer.  Kaitlyn Johnson is unaware of previous family history of genetic testing for hereditary cancer risks.  There is no reported Ashkenazi Jewish ancestry. There is no known consanguinity.  GENETIC COUNSELING ASSESSMENT: Kaitlyn Johnson is a 43 y.o. female with a personal and family history of cancer which is somewhat suggestive of a hereditary cancer syndrome and predisposition to cancer given her young age of onset. We, therefore, discussed and recommended the following at today's visit.   DISCUSSION: We discussed that, in general, most cancer is not inherited in families, but instead is  sporadic or familial. Sporadic cancers occur by chance and typically happen at older ages (>50 years) as this type of cancer is caused by genetic changes acquired during an individual's lifetime. Some families have more cancers than would be expected by chance; however, the ages or types of cancer are not consistent with a known genetic mutation or known genetic mutations have been ruled out. This type of familial cancer is thought to be due to a combination of multiple genetic, environmental, hormonal, and lifestyle factors. While this combination of factors likely increases the risk of cancer, the exact source of this risk is not currently identifiable or testable.  We discussed that 5 - 10% of breast cancer is hereditary, with most cases associated with BRCA mutations.  There are other genes that can be associated with hereditary breast cancer syndromes.  These include ATM, CHEK2 and PALB2.  We discussed that testing is beneficial for several reasons including knowing how to follow individuals after completing their treatment, identifying whether potential treatment options such as PARP inhibitors would be beneficial, and understand if other family members could be at risk for cancer and allow them to undergo genetic testing.   We reviewed the characteristics, features and inheritance patterns of hereditary cancer syndromes. We also discussed genetic testing, including the appropriate family members to test, the process of testing, insurance coverage and turn-around-time for results. We discussed the implications of a negative, positive, carrier and/or variant of uncertain significant result. Kaitlyn Johnson  was offered a common hereditary cancer panel (36+ genes) and an expanded pan-cancer panel (70+ genes). Kaitlyn Johnson was informed of the benefits and limitations of each panel, including that expanded pan-cancer panels contain genes that do not have clear management guidelines at this point in time.  We also  discussed that as the number of genes included on a panel increases, the chances of variants of uncertain significance increases.In order to get genetic test results in a timely manner so that Kaitlyn Johnson can use these genetic test results for surgical decisions, we recommended Kaitlyn Johnson pursue genetic testing for the BRCAPlus. Once complete, we recommend Kaitlyn Johnson pursue reflex genetic testing to the CancerNext-Expanded+RNA gene panel.   The CancerNext-Expanded gene panel offered by [facility] and includes sequencing, rearrangement, and  RNA analysis for the following 76 genes: AIP, ALK, APC, ATM, AXIN2, BAP1, BARD1, BMPR1A, BRCA1, BRCA2, BRIP1, CDC73, CDH1, CDK4, CDKN1B, CDKN2A, CEBPA, CHEK2, CTNNA1, DDX41, DICER1, ETV6, FH, FLCN, GATA2, LZTR1, MAX, MBD4, MEN1, MET, MLH1, MSH2, MSH3, MSH6, MUTYH, NF1, NF2, NTHL1, PALB2, PHOX2B, PMS2, POT1, PRKAR1A, PTCH1, PTEN, RAD51C, RAD51D, RB1, RET, RUNX1, SDHA, SDHAF2, SDHB, SDHC, SDHD, SMAD4, SMARCA4, SMARCB1, SMARCE1, STK11, SUFU, TMEM127, TP53, TSC1, TSC2, VHL, and WT1 (sequencing and deletion/duplication); EGFR, HOXB13, KIT, MITF, PDGFRA, POLD1, and POLE (sequencing only); EPCAM and GREM1 (deletion/duplication only).    Based on Kaitlyn Johnson's personal and family history of cancer, she meets medical criteria for genetic testing. Despite that she meets criteria, she may still have an out of pocket cost. We discussed that if her out of pocket cost for testing is over $100, the laboratory will call and confirm whether she wants to proceed with testing.  If the out of pocket cost of testing is less than $100 she will be billed by the genetic testing laboratory.   We discussed that some people do not want to undergo genetic testing due to fear of genetic discrimination.  The Genetic Information Nondiscrimination Act (GINA) was signed into federal law in 2008. GINA prohibits health insurers and most employers from discriminating against individuals based on genetic  information (including the results of genetic tests and family history information). According to GINA, health insurance companies cannot consider genetic information to be a preexisting condition, nor can they use it to make decisions regarding coverage or rates. GINA also makes it illegal for most employers to use genetic information in making decisions about hiring, firing, promotion, or terms of employment. It is important to note that GINA does not offer protections for life insurance, disability insurance, or long-term care insurance. GINA does not apply to those in the [employer], those who work for companies with less than 15 employees, and new life insurance or long-term disability insurance policies.  Health status due to a cancer diagnosis is not protected under GINA. More information about GINA can be found by visiting [email or web address].  PLAN: After considering the risks, benefits, and limitations, Kaitlyn Johnson provided informed consent to pursue genetic testing and the blood sample was sent to [facility] for analysis of the CancerNext-Expanded+RNAinsight. Results should be available within approximately 2-3 weeks' time, at which point they will be disclosed by telephone to Kaitlyn Johnson, as will any additional recommendations warranted by these results. Kaitlyn Johnson will receive a summary of her genetic counseling visit and a copy of her results once available. This information will also be available in Epic.   Lastly, we encouraged Kaitlyn Johnson to remain in contact with cancer genetics annually so that we can continuously update the family history and inform her of any changes in cancer genetics and testing that may be of benefit for this family.   Kaitlyn Johnson questions were answered to her satisfaction today. Our contact information was provided should additional questions or concerns arise. Thank you for the referral and allowing [place] to share in the care of your patient.   Yobana Culliton P.  Lowell Guitar, MS, CGC Licensed, Patent attorney [email or web address] phone: [phone]  60 minutes were spent on the date of the encounter in service to the patient including preparation, face-to-face consultation, documentation and care coordination.  The patient was seen alone.  Drs. Meliton Rattan, and/or [place] were available for questions, if needed..    _______________________________________________________________________ For Office Staff:  Number of people involved in session: 1  Was an Intern/ student involved with case: no

## 2023-11-27 NOTE — Progress Notes (Signed)

## 2023-11-28 DIAGNOSIS — C50911 Malignant neoplasm of unspecified site of right female breast: Secondary | ICD-10-CM | POA: Diagnosis not present

## 2023-11-28 DIAGNOSIS — K3184 Gastroparesis: Secondary | ICD-10-CM | POA: Diagnosis not present

## 2023-11-29 DIAGNOSIS — R112 Nausea with vomiting, unspecified: Secondary | ICD-10-CM | POA: Diagnosis not present

## 2023-11-29 DIAGNOSIS — K3184 Gastroparesis: Secondary | ICD-10-CM | POA: Diagnosis not present

## 2023-11-30 DIAGNOSIS — K3184 Gastroparesis: Secondary | ICD-10-CM | POA: Diagnosis not present

## 2023-12-01 DIAGNOSIS — K3184 Gastroparesis: Secondary | ICD-10-CM | POA: Diagnosis not present

## 2023-12-01 NOTE — H&P (Signed)
REFERRING PHYSICIAN: Lenoard Aden, MD PROVIDER: Wayne Both, MD MRN: [id number] DOB: September 21, 1981 DATE OF ENCOUNTER: 11/20/2023 Subjective   Chief Complaint: New Consultation and Breast Cancer  History of Present Illness: Kaitlyn Johnson is a 43 y.o. female who is seen today as an office consultation for evaluation of New Consultation and Breast Cancer  43 year old female who was found on recent screening mammography to have a small distortion in the right breast. She had had a prior history of breast reduction surgery. She underwent an ultrasound showing a 9 mm mass at the 10 o'clock position 2 cm from the nipple. Ultrasound the axilla was negative. 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 It was 100% ER positive, 1% PR positive, HER2 negative, and had a Ki-67 of 10%. She has had previous gastric bypass surgery and has had significant issues regarding this and has a gastrostomy tube which was placed in November of last year for which she gets tube feeds because of her significant gastroparesis. She is also interested in bilateral mastectomies given her worry about any antihormonal treatment affecting her gastrointestinal issues. There is no family history of breast cancer.  Review of Systems: A complete review of systems was obtained from the patient. I have reviewed this information and discussed as appropriate with the patient. See HPI as well for other ROS.  ROS   Medical History: Past Medical History:  Diagnosis Date  Abdominal pain, acute, right upper quadrant  Acquired von Willebrand disease (CMS/HHS-HCC)  Acquired von Willebrand's disease (CMS/HHS-HCC)  Anemia  Anxiety  Asthma (HHS-HCC)  Celiac disease (HHS-HCC)  Cushing's disease (CMS/HHS-HCC)  Depression  Diarrhea  Endometriosis  s/p hysterectomy  Interstitial cystitis  Kidney stones  Last episode 2014, spontaneously passed, no recurrence   Pancreatitis (HHS-HCC)  PONV (postoperative nausea and vomiting)  Past hx, last few procedures ok  POTS (postural orthostatic tachycardia syndrome)  SMAS (superior mesenteric artery syndrome) (CMS/HHS-HCC) 2000  Von Willebrand disease (CMS/HHS-HCC) 2012  Per patient report. Lab workup for von Willebrand's disease normal  Wears glasses   Patient Active Problem List  Diagnosis  Abdominal pain  Abnormal LFTs  POTS (postural orthostatic tachycardia syndrome)  Superior mesenteric artery syndrome (CMS-HCC)  Endometriosis  Anemia  Asthma (HHS-HCC)  Depression  Anxiety  Interstitial cystitis  Acquired von Willebrand's disease (CMS/HHS-HCC)  Acute pancreatitis (HHS-HCC)  Fatigue  Nausea vomiting and diarrhea  Unintentional weight loss of more than 10% body weight within 6 months  Allergic reaction to bee sting  Allergic rhinitis  Arthropathy of lumbar facet joint  Bertolotti's syndrome  Chronic right shoulder pain  Cushing's syndrome (CMS/HHS-HCC)  Diarrhea  Gastroparesis  History of excision of intestinal structure  History of hysterectomy  History of pancreatitis  History of shingles  HNP (herniated nucleus pulposus), lumbar  Hypomagnesemia  Insomnia  Iron deficiency anemia  Kidney stones  Lumbago with sciatica  Mild intermittent asthma (HHS-HCC)  Neck pain  New daily persistent headache  Other specified postprocedural states  Palpitations  Paresthesia of both hands  S/P laparoscopic sleeve gastrectomy  S/P lumbar discectomy  Skin sensation disturbance  Tachycardia  Ulnar neuropathy  S/P lumbar fusion  Superficial skin infection  Right upper quadrant pain   Past Surgical History:  Procedure Laterality Date  roux-en-y duodenojejunostomy 2000  for SMA syndrome  CHOLECYSTECTOMY 2001  "diseased gallbladder"  HYSTERECTOMY VAGINAL 2011  removed Right ovary only  ENDOSCOPY OF BILIARY DUCT 07/16/2012  Procedure:  ENDOSCOPY OF BILIARY DUCT; Surgeon: Darol Destine, MD; Location: [facility]; Service: Gastroenterology;;  ENDOSCOPIC RETROGRADE CHOLANGIO-PANCREATOGRAPHY W/SPHINCTEROTOMY 07/16/2012  Procedure: ENDOSCOPIC RETROGRADE CHOLANGIO-PANCREATOGRAPHY W/SPHINCTEROTOMY; Surgeon: Darol Destine, MD; Location: [facility]; Service: Gastroenterology;;  ENDOSCOPIC RETROGRADE CHOLANGIO-PANCREATOGRAPHY W/SPHINCTEROTOMY N/A 01/15/2014  Procedure: ENDOSCOPIC RETROGRADE CHOLANGIO-PANCREATOGRAPHY W/SPHINCTEROTOMY; Surgeon: Darol Destine, MD; Location: [facility]; Service: Gastroenterology; Laterality: N/A;  ENDOSCOPIC RETROGRADE CHOLANGIO-PANCREATOGRAPHY W/BILIARY TUBE/STENT 01/15/2014  Procedure: ERCP; WITH PLACEMENT OF ENDOSCOPIC STENT INTO BILIARY / PANCREATIC DUCT, INCLUDING PRE- AND POST-DILATION AND GUIDE WIRE PASSAGE, WHEN PERFORMED, INCLUDING SPHINCTEROTOMY, WHEN PERFORMED, EACH STENT; Surgeon: Darol Destine, MD; Location: [facility]; Service: Gastroenterology;; Minor Pancreatic Duct  ENDOSCOPY OF BILIARY DUCT 01/15/2014  Procedure: ENDOSCOPY OF BILIARY DUCT; Surgeon: Darol Destine, MD; Location: [facility]; Service: Gastroenterology;;  ENDOSCOPIC ULTRASOUND N/A 12/12/2015  Procedure: Upper EUS; Surgeon: Jonny Ruiz, MD; Location: [facility]; Service: Gastroenterology; Laterality: N/A;  Anterior Cervical Dissectomy Infusion 01/05/2021  ESOPHAGOGASTRODOUDENOSCOPY W/BIOPSY N/A 11/23/2021  Procedure: EGD - Upper Endoscopy; Surgeon: Darol Destine, MD; Location: [facility]; Service: Gastroenterology; Laterality: N/A;  COLONOSCOPY W/BIOPSY N/A 12/20/2021  Procedure: COLONOSCOPY; Surgeon: Trinna Balloon, MD; [facility]; Service: Gastroenterology; Laterality: N/A;  COLONOSCOPY W/REMOVAL LESIONS BY SNARE 12/20/2021  Procedure: COLONOSCOPY, FLEXIBLE; WITH REMOVAL OF TUMOR(S), POLYP(S), OR OTHER LESION(S)  BY SNARE TECHNIQUE; Surgeon: Trinna Balloon, MD; Location: [facility]; Service: Gastroenterology;;  OBLIQUE LATERAL INTERBODY FUSION LUMBAR N/A 06/19/2022  Procedure: **3D C-arm** L5-S1 anterior lumbar interbody fusion and minimally invasive posterior instrumentation with stereotactic navigation ARTHRODESIS, ANTERIOR INTERBODY TECHNIQUE, INCLUDING MINIMAL DISCECTOMY TO PREPARE INTERSPACE (OTHER THAN FOR DECOMPRESSION); LUMBAR; Surgeon: Than, Karen Kitchens, MD; Location: [facility]; Service: Neurosurgery; Laterality: N/A;  INSERTION INTERBODY BIOMECHANICAL DEVICE W/ INSTRUMENTATION N/A 06/19/2022  Procedure: INSERTION INTERBODY BIOMECHANICAL DEVICE WITH INTEGRAL ANTERIOR INSTRUMENTATION, TO INTERVERTEBRAL DISC SPACE IN CONJUNCTION INTERBODY ARTHRODESIS, EACH INTERSPACE (LIST CODE FOR PRIMARY PROCEDURE); Surgeon: Than, Karen Kitchens, MD; [facility]: [facility]; Service: Neurosurgery; Laterality: N/A;  INSERTION MORSELIZED BONE ALLOGRAFT FOR SPINE SURGERY N/A 06/19/2022  Procedure: ALLOGRAFT, MORSELIZED, OR PLACEMENT OF OSTEOPROMOTIVE MATERIAL, FOR SPINE SURGERY ONLY (LIST IN ADDITION TO PRIMARY PROCEDURE); Surgeon: Than, Karen Kitchens, MD; Location: [facility]; Service: Neurosurgery; Laterality: N/A;  INSTRUMENTATION NON-SEGMENTAL POSTERIOR SPINE N/A 06/19/2022  Procedure: INSTRUMENTATION POSTERIOR SPINE 1/2 VERTEBRAL SEGMENTS W/O FIXATION ADDITIONAL FIFTH; Surgeon: Than, Karen Kitchens, MD; Location: [facility]; Service: Neurosurgery; Laterality: N/A;  OBLIQUE LATERAL INTERBODY FUSION LUMBAR N/A 06/19/2022  Procedure: ARTHRODESIS, ANTERIOR INTERBODY TECHNIQUE, INCLUDING MINIMAL DISCECTOMY TO PREPARE INTERSPACE (OTHER THAN FOR DECOMPRESSION); LUMBAR; Surgeon: Burman Blacksmith, MD; Location: [facility]; Service: General Surgery; Laterality: N/A;  EGD N/A 09/10/2023  Procedure: Upper Endoscopy (EGD); Surgeon: Dorita Fray, MD; Location[facility]; Service:  Gastroenterology; Laterality: N/A;  ANTERIOR FUSION CERVICAL SPINE  c4-c5  EGD  HYSTERECTOMY    Allergies  Allergen Reactions  Metoclopramide Hcl Other (See Comments) and Unknown  Occular gyro crisis  Shellfish Containing Products Anaphylaxis and Unknown  Venom-Honey Bee Anaphylaxis  Sulfa (Sulfonamide Antibiotics) Rash  Diffuse rash and pruritus  Other Other (See Comments)  Other reaction(s): slept for 28 hrs  Tapentadol Hives  Fd And C No.5 (Tartrazine) Unknown and Rash  Other reaction(s): NOT KNOWN Other reaction(s): NOT KNOWN  Sulfacetamide Sodium Rash  Sulfasalazine Rash  Yellow Dye Rash and Hives   Current Outpatient Medications on File Prior to Visit  Medication Sig Dispense Refill  ABILIFY MAINTENA 400 mg IM syringe  Inject into the muscle every 28 (twenty-eight) days  acebutoloL (SECTRAL) 200 MG capsule Take 200 mg by mouth 2 (two) times daily  AMBIEN CR 12.5 mg CR tablet Take 12.5 mg by mouth at bedtime 4  clonazePAM (KLONOPIN) 0.5 MG tablet Take 0.5 mg by mouth 3 (three) times daily as needed 0  dexlansoprazole (DEXILANT) 30 mg DR capsule Take 1 capsule (30 mg total) by mouth 2 (two) times daily 180 capsule 3  diltiazem (CARDIZEM CD) 180 MG CD capsule Take 180 mg by mouth every morning  ivabradine (CORLANOR) 7.5 mg tablet Take 7.5 mg by mouth 2 (two) times daily with meals  lamoTRIgine (LAMICTAL) 100 MG tablet Take 1 tablet by mouth once daily  montelukast (SINGULAIR) 10 mg tablet Take 10 mg by mouth at bedtime  ondansetron (ZOFRAN-ODT) 4 MG disintegrating tablet Take 1 tablet (4 mg total) by mouth every 8 (eight) hours as needed 20 tablet 0  traZODone (DESYREL) 100 MG tablet Take 300 mg by mouth at bedtime 2  busPIRone (BUSPAR) 10 MG tablet Take 2 tablets (20 mg total) by mouth 3 (three) times daily 180 tablet 11  cyanocobalamin, vitamin B-12, (VITAMIN B-12 INJ) Inject as directed monthly  desipramine (NORPRAMIN) 25 MG tablet Take 1 tablet (25 mg total) by mouth at  bedtime 30 tablet 11  dicyclomine (BENTYL) 10 mg capsule Take 1 capsule (10 mg total) by mouth 4 (four) times daily before meals and nightly 120 capsule 0  diphenoxylate-atropine (LOMOTIL) 2.5-0.025 mg tablet Take 1 tablet by mouth 4 (four) times daily as needed for Diarrhea for up to 10 days 60 tablet 0  EPINEPHrine (EPIPEN) 0.3 mg/0.3 mL auto-injector Inject 0.3 mg into the muscle as needed for Anaphylaxis  PROAIR HFA 90 mcg/actuation inhaler Inhale 2 inhalations into the lungs every 4 (four) hours as needed for Wheezing or Shortness of Breath 2  prucalopride (MOTEGRITY) 1 mg tablet Take 1 tablet (1 mg total) by mouth once daily 30 tablet 1  thiamine (VITAMIN B-1) 100 MG tablet Take 1 tablet (100 mg total) by mouth once daily 30 tablet 0   No current facility-administered medications on file prior to visit.   Family History  Problem Relation Age of Onset  High blood pressure (Hypertension) Mother  Anesthesia problems Neg Hx  Malignant hypertension Neg Hx    Social History   Tobacco Use  Smoking Status Never  Smokeless Tobacco Never    Social History   Socioeconomic History  Marital status: Married  Number of children: 0  Occupational History  Occupation: Environmental manager  Occupation: Radiation protection practitioner- [employer]  Tobacco Use  Smoking status: Never  Smokeless tobacco: Never  Vaping Use  Vaping status: Never Used  Substance and Sexual Activity  Alcohol use: Not Currently  Comment: occasional  Drug use: No  Sexual activity: Yes  Partners: Male  Birth control/protection: Other-see comments   Social Drivers of Health   Financial Resource Strain: Low Risk (10/01/2023)  Received from [facility]  Overall Financial Resource Strain (CARDIA)  Difficulty of Paying Living Expenses: Not hard at all  Food Insecurity: No Food Insecurity (10/01/2023)  Received from [facility]  Hunger Vital Sign  Worried About Running Out of Food in the Last Year: Never true  Ran Out of Food in  the Last Year: Never true  Transportation Needs: No Transportation Needs (10/01/2023)  Received from [facility] - Transportation  Lack of Transportation (Medical): No  Lack of Transportation (Non-Medical): No  Physical Activity: Unknown (  10/01/2023)  Received from [facility]  Exercise Vital Sign  Days of Exercise per Week: 0 days  Stress: No Stress Concern Present (10/01/2023)  Received from [facility] - Occupational Stress Questionnaire  Feeling of Stress : Not at all  Social Connections: Moderately Integrated (10/01/2023)  Received from [facility]  Social Connection and Isolation Panel [NHANES]  Frequency of Communication with Friends and Family: More than three times a week  Frequency of Social Gatherings with Friends and Family: Twice a week  Attends Religious Services: More than 4 times per year  Active Member of [employer] or Organizations: No  Marital Status: Married  Housing Stability: Low Risk (11/20/2023)  Housing Stability Vital Sign  Unable to Pay for Housing in the Last Year: No  Number of Times Moved in the Last Year: 0  Homeless in the Last Year: No   Objective:   Vitals:  11/20/23 1449  BP: 133/87  Pulse: 88  Temp: 36.7 C (98 F)  SpO2: 99%  Weight: 93.9 kg (207 lb)  Height: 175.3 cm (5\' 9" )   Body mass index is 30.57 kg/m.  Physical Exam   She appears well on exam  There are no palpable breast masses. Her bilateral reduction scars are well-healed. There is no axillary adenopathy.  He has a gastrostomy tube in place  Labs, Imaging and Diagnostic Testing: Reviewed her mammograms, ultrasound, and pathology results  Assessment and Plan:   Diagnoses and all orders for this visit:  Invasive ductal carcinoma of breast, female, right (CMS/HHS-HCC)   I discussed the diagnosis with the patient and her husband. We have also discussed in our multidisciplinary breast cancer conference this morning. From a  surgical standpoint we discussed treatment of breast cancer which includes breast conserving surgery with a lumpectomy versus mastectomy. Given her significant past medical history, she is already decided she would like to proceed with bilateral mastectomies with reconstruction. I believe this is very reasonable given the density of her breast and young age. I explained the surgical procedure in detail. We would recommend a sentinel lymph node biopsy on the right side given the malignancy and I would also inject mag trace on the left side in case malignancy is found of that breast on final pathology. She will be referred to the cancer center to see medical oncology, genetics, and physical therapy. I will also refer her to Dr. Arita Miss to evaluate the patient from a plastic surgery standpoint for consideration of immediate reconstruction at the time of mastectomies.

## 2023-12-01 NOTE — Anesthesia Preprocedure Evaluation (Signed)
Anesthesia Evaluation  Patient identified by MRN, date of birth, ID band Patient awake    Reviewed: Allergy & Precautions, NPO status , Patient's Chart, lab work & pertinent test results  Airway Mallampati: II  TM Distance: >3 FB Neck ROM: Full    Dental no notable dental hx. (+) Teeth Intact, Dental Advisory Given   Pulmonary asthma    Pulmonary exam normal breath sounds clear to auscultation       Cardiovascular Normal cardiovascular exam Rhythm:Regular Rate:Normal     Neuro/Psych    GI/Hepatic   Endo/Other    Renal/GU      Musculoskeletal   Abdominal   Peds  Hematology  (+) Blood dyscrasia, anemia Von Willibrands dz   Anesthesia Other Findings   Reproductive/Obstetrics                              Anesthesia Physical Anesthesia Plan  ASA: 2  Anesthesia Plan: General   Post-op Pain Management: Regional block*, Minimal or no pain anticipated, Toradol IV (intra-op)*, Tylenol PO (pre-op)* and Ketamine IV*   Induction: Intravenous  PONV Risk Score and Plan: 4 or greater and Treatment may vary due to age or medical condition, Ondansetron, Midazolam and Scopolamine patch - Pre-op  Airway Management Planned: Oral ETT  Additional Equipment: None  Intra-op Plan:   Post-operative Plan: Extubation in OR  Informed Consent: I have reviewed the patients History and Physical, chart, labs and discussed the procedure including the risks, benefits and alternatives for the proposed anesthesia with the patient or authorized representative who has indicated his/her understanding and acceptance.     Dental advisory given  Plan Discussed with: CRNA and Surgeon  Anesthesia Plan Comments:          Anesthesia Quick Evaluation

## 2023-12-02 ENCOUNTER — Other Ambulatory Visit: Payer: Self-pay

## 2023-12-02 ENCOUNTER — Ambulatory Visit (HOSPITAL_BASED_OUTPATIENT_CLINIC_OR_DEPARTMENT_OTHER): Payer: Self-pay | Admitting: Anesthesiology

## 2023-12-02 ENCOUNTER — Encounter (HOSPITAL_BASED_OUTPATIENT_CLINIC_OR_DEPARTMENT_OTHER)
Admission: RE | Disposition: A | Discharge status: Home / Self Care | Payer: Self-pay | Source: Home / Self Care | Attending: Plastic Surgery

## 2023-12-02 ENCOUNTER — Observation Stay (HOSPITAL_BASED_OUTPATIENT_CLINIC_OR_DEPARTMENT_OTHER)
Admission: RE | Admit: 2023-12-02 | Discharge: 2023-12-03 | Disposition: A | Discharge status: Home / Self Care | Payer: BC Managed Care – PPO | Attending: Plastic Surgery | Admitting: Plastic Surgery

## 2023-12-02 ENCOUNTER — Encounter (HOSPITAL_BASED_OUTPATIENT_CLINIC_OR_DEPARTMENT_OTHER): Payer: Self-pay | Admitting: Surgery

## 2023-12-02 ENCOUNTER — Encounter: Payer: Self-pay | Admitting: Genetic Counselor

## 2023-12-02 DIAGNOSIS — K3184 Gastroparesis: Secondary | ICD-10-CM | POA: Diagnosis not present

## 2023-12-02 DIAGNOSIS — Z1379 Encounter for other screening for genetic and chromosomal anomalies: Secondary | ICD-10-CM | POA: Insufficient documentation

## 2023-12-02 DIAGNOSIS — G8918 Other acute postprocedural pain: Secondary | ICD-10-CM | POA: Diagnosis not present

## 2023-12-02 DIAGNOSIS — Z170 Estrogen receptor positive status [ER+]: Secondary | ICD-10-CM | POA: Diagnosis not present

## 2023-12-02 DIAGNOSIS — J45909 Unspecified asthma, uncomplicated: Secondary | ICD-10-CM | POA: Insufficient documentation

## 2023-12-02 DIAGNOSIS — C50411 Malignant neoplasm of upper-outer quadrant of right female breast: Secondary | ICD-10-CM | POA: Diagnosis not present

## 2023-12-02 DIAGNOSIS — C50911 Malignant neoplasm of unspecified site of right female breast: Secondary | ICD-10-CM | POA: Diagnosis not present

## 2023-12-02 DIAGNOSIS — C50919 Malignant neoplasm of unspecified site of unspecified female breast: Principal | ICD-10-CM | POA: Diagnosis present

## 2023-12-02 DIAGNOSIS — D0511 Intraductal carcinoma in situ of right breast: Secondary | ICD-10-CM | POA: Diagnosis not present

## 2023-12-02 DIAGNOSIS — Z4001 Encounter for prophylactic removal of breast: Secondary | ICD-10-CM | POA: Diagnosis not present

## 2023-12-02 DIAGNOSIS — Z79899 Other long term (current) drug therapy: Secondary | ICD-10-CM | POA: Diagnosis not present

## 2023-12-02 HISTORY — PX: SIMPLE MASTECTOMY WITH AXILLARY SENTINEL NODE BIOPSY: SHX6098

## 2023-12-02 HISTORY — PX: BREAST RECONSTRUCTION WITH PLACEMENT OF TISSUE EXPANDER AND FLEX HD (ACELLULAR HYDRATED DERMIS): SHX6295

## 2023-12-02 SURGERY — SIMPLE MASTECTOMY WITH AXILLARY SENTINEL NODE BIOPSY
Anesthesia: General | Site: Breast | Laterality: Bilateral

## 2023-12-02 MED ORDER — LIDOCAINE 2% (20 MG/ML) 5 ML SYRINGE
INTRAMUSCULAR | Status: DC | PRN
  Administered 2023-12-02: 80 mg via INTRAVENOUS

## 2023-12-02 MED ORDER — FENTANYL CITRATE (PF) 250 MCG/5ML IJ SOLN: INTRAMUSCULAR | Status: DC | PRN

## 2023-12-02 MED ORDER — ONDANSETRON 4 MG PO TBDP: 4.0000 mg | ORAL_TABLET | Freq: Four times a day (QID) | ORAL | Status: DC | PRN

## 2023-12-02 MED ORDER — HYDROMORPHONE HCL 1 MG/ML IJ SOLN
0.2500 mg | INTRAMUSCULAR | Status: DC | PRN
Start: 2023-12-02 — End: 2023-12-03

## 2023-12-02 MED ORDER — KETAMINE HCL 50 MG/5ML IJ SOSY
PREFILLED_SYRINGE | INTRAMUSCULAR | Status: AC
  Filled 2023-12-02: qty 5

## 2023-12-02 MED ORDER — AMISULPRIDE (ANTIEMETIC) 5 MG/2ML IV SOLN
10.0000 mg | Freq: Once | INTRAVENOUS | Status: DC | PRN
Start: 2023-12-02 — End: 2023-12-03

## 2023-12-02 MED ORDER — SODIUM CHLORIDE 0.9 % IV SOLN
INTRAVENOUS | Status: DC | PRN
  Administered 2023-12-02: 500 mL

## 2023-12-02 MED ORDER — MIDAZOLAM HCL 2 MG/2ML IJ SOLN
INTRAMUSCULAR | Status: DC | PRN
  Administered 2023-12-02: 2 mg via INTRAVENOUS

## 2023-12-02 MED ORDER — FENTANYL CITRATE (PF) 100 MCG/2ML IJ SOLN
INTRAMUSCULAR | Status: AC
  Filled 2023-12-02: qty 2

## 2023-12-02 MED ORDER — ONDANSETRON HCL 4 MG/2ML IJ SOLN
4.0000 mg | Freq: Once | INTRAMUSCULAR | Status: DC | PRN
Start: 2023-12-02 — End: 2023-12-03

## 2023-12-02 MED ORDER — BUSPIRONE HCL 10 MG PO TABS
20.0000 mg | ORAL_TABLET | Freq: Three times a day (TID) | ORAL | Status: DC
  Administered 2023-12-02: 20 mg via ORAL
  Filled 2023-12-02: qty 2

## 2023-12-02 MED ORDER — STERILE WATER FOR INJECTION IJ SOLN
INTRAMUSCULAR | Status: DC | PRN
  Administered 2023-12-02: 3 mL via INTRAVENOUS

## 2023-12-02 MED ORDER — CEFAZOLIN SODIUM-DEXTROSE 2-4 GM/100ML-% IV SOLN
INTRAVENOUS | Status: AC
  Filled 2023-12-02: qty 100

## 2023-12-02 MED ORDER — ONDANSETRON HCL 4 MG/2ML IJ SOLN: 4.0000 mg | Freq: Four times a day (QID) | INTRAMUSCULAR | Status: DC | PRN

## 2023-12-02 MED ORDER — METHOCARBAMOL 500 MG PO TABS
500.0000 mg | ORAL_TABLET | Freq: Three times a day (TID) | ORAL | Status: DC | PRN
  Administered 2023-12-02: 500 mg via ORAL
  Filled 2023-12-02: qty 1

## 2023-12-02 MED ORDER — SCOPOLAMINE 1 MG/3DAYS TD PT72
1.0000 | MEDICATED_PATCH | TRANSDERMAL | Status: DC
  Administered 2023-12-02: 1.5 mg via TRANSDERMAL

## 2023-12-02 MED ORDER — LACTATED RINGERS IV SOLN
INTRAVENOUS | Status: AC
Start: 2023-12-02 — End: 2023-12-03

## 2023-12-02 MED ORDER — ROCURONIUM BROMIDE 10 MG/ML (PF) SYRINGE
PREFILLED_SYRINGE | INTRAVENOUS | Status: DC | PRN
  Administered 2023-12-02 (×2): 50 mg via INTRAVENOUS

## 2023-12-02 MED ORDER — HYDROMORPHONE HCL 1 MG/ML IJ SOLN
0.2500 mg | INTRAMUSCULAR | Status: DC | PRN
  Administered 2023-12-02 (×2): 0.5 mg via INTRAVENOUS

## 2023-12-02 MED ORDER — CHLORHEXIDINE GLUCONATE CLOTH 2 % EX PADS: 6.0000 | MEDICATED_PAD | Freq: Once | CUTANEOUS | Status: DC

## 2023-12-02 MED ORDER — EPHEDRINE SULFATE-NACL 50-0.9 MG/10ML-% IV SOSY
PREFILLED_SYRINGE | INTRAVENOUS | Status: DC | PRN
  Administered 2023-12-02: 15 mg via INTRAVENOUS
  Administered 2023-12-02: 10 mg via INTRAVENOUS

## 2023-12-02 MED ORDER — MIDAZOLAM HCL 2 MG/2ML IJ SOLN
INTRAMUSCULAR | Status: AC
  Filled 2023-12-02: qty 2

## 2023-12-02 MED ORDER — HYDROMORPHONE HCL 1 MG/ML IJ SOLN
INTRAMUSCULAR | Status: AC
  Filled 2023-12-02: qty 0.5

## 2023-12-02 MED ORDER — DIPHENOXYLATE-ATROPINE 2.5-0.025 MG PO TABS: 1.0000 | ORAL_TABLET | Freq: Four times a day (QID) | ORAL | Status: DC | PRN

## 2023-12-02 MED ORDER — OXYCODONE HCL 5 MG/5ML PO SOLN: 5.0000 mg | Freq: Once | ORAL | Status: DC | PRN

## 2023-12-02 MED ORDER — CLONAZEPAM 0.5 MG PO TABS: 0.5000 mg | ORAL_TABLET | Freq: Two times a day (BID) | ORAL | Status: DC | PRN

## 2023-12-02 MED ORDER — DESIPRAMINE HCL 25 MG PO TABS
25.0000 mg | ORAL_TABLET | Freq: Every day | ORAL | Status: DC
  Administered 2023-12-02: 25 mg via ORAL
  Filled 2023-12-02: qty 1

## 2023-12-02 MED ORDER — LIDOCAINE 2% (20 MG/ML) 5 ML SYRINGE
INTRAMUSCULAR | Status: AC
  Filled 2023-12-02: qty 5

## 2023-12-02 MED ORDER — IPRATROPIUM BROMIDE 0.06 % NA SOLN: 2.0000 | Freq: Two times a day (BID) | NASAL | Status: DC

## 2023-12-02 MED ORDER — ACETAMINOPHEN 10 MG/ML IV SOLN: 1000.0000 mg | Freq: Once | INTRAVENOUS | Status: DC | PRN

## 2023-12-02 MED ORDER — ACETAMINOPHEN 325 MG RE SUPP: 650.0000 mg | Freq: Four times a day (QID) | RECTAL | Status: DC | PRN

## 2023-12-02 MED ORDER — PROPOFOL 10 MG/ML IV BOLUS
INTRAVENOUS | Status: DC | PRN
  Administered 2023-12-02: 200 mg via INTRAVENOUS

## 2023-12-02 MED ORDER — BUPIVACAINE LIPOSOME 1.3 % IJ SUSP
INTRAMUSCULAR | Status: DC | PRN
  Administered 2023-12-02: 10 mL

## 2023-12-02 MED ORDER — MAGTRACE LYMPHATIC TRACER
INTRAMUSCULAR | Status: DC | PRN
  Administered 2023-12-02 (×2): 1.5 mL via INTRAMUSCULAR

## 2023-12-02 MED ORDER — ROCURONIUM BROMIDE 10 MG/ML (PF) SYRINGE
PREFILLED_SYRINGE | INTRAVENOUS | Status: AC
  Filled 2023-12-02: qty 10

## 2023-12-02 MED ORDER — OXYCODONE HCL 5 MG PO TABS
5.0000 mg | ORAL_TABLET | Freq: Once | ORAL | Status: DC | PRN
Start: 2023-12-02 — End: 2023-12-03

## 2023-12-02 MED ORDER — MIDAZOLAM HCL 2 MG/2ML IJ SOLN
2.0000 mg | Freq: Once | INTRAMUSCULAR | Status: AC
  Administered 2023-12-02: 2 mg via INTRAVENOUS

## 2023-12-02 MED ORDER — DEXMEDETOMIDINE HCL IN NACL 80 MCG/20ML IV SOLN
INTRAVENOUS | Status: AC
  Filled 2023-12-02: qty 20

## 2023-12-02 MED ORDER — BUPIVACAINE HCL (PF) 0.5 % IJ SOLN
INTRAMUSCULAR | Status: DC | PRN
  Administered 2023-12-02: 20 mL

## 2023-12-02 MED ORDER — DEXMEDETOMIDINE HCL IN NACL 80 MCG/20ML IV SOLN
INTRAVENOUS | Status: DC | PRN
  Administered 2023-12-02 (×2): 20 ug via INTRAVENOUS
  Administered 2023-12-02: 12 ug via INTRAVENOUS

## 2023-12-02 MED ORDER — ONDANSETRON HCL 4 MG/2ML IJ SOLN: 4.0000 mg | Freq: Once | INTRAMUSCULAR | Status: DC | PRN

## 2023-12-02 MED ORDER — LAMOTRIGINE 100 MG PO TABS
100.0000 mg | ORAL_TABLET | Freq: Every day | ORAL | Status: DC
  Administered 2023-12-02: 100 mg via ORAL
  Filled 2023-12-02: qty 1

## 2023-12-02 MED ORDER — 0.9 % SODIUM CHLORIDE (POUR BTL) OPTIME
TOPICAL | Status: DC | PRN
  Administered 2023-12-02: 300 mL

## 2023-12-02 MED ORDER — CEFAZOLIN SODIUM-DEXTROSE 2-4 GM/100ML-% IV SOLN
2.0000 g | INTRAVENOUS | Status: AC
  Administered 2023-12-02: 2 g via INTRAVENOUS

## 2023-12-02 MED ORDER — CHLORHEXIDINE GLUCONATE CLOTH 2 % EX PADS
6.0000 | MEDICATED_PAD | Freq: Once | CUTANEOUS | Status: AC
  Administered 2023-12-02: 6 via TOPICAL

## 2023-12-02 MED ORDER — METHOCARBAMOL 1000 MG/10ML IJ SOLN: 500.0000 mg | Freq: Three times a day (TID) | INTRAMUSCULAR | Status: DC | PRN

## 2023-12-02 MED ORDER — TRAZODONE HCL 100 MG PO TABS
100.0000 mg | ORAL_TABLET | Freq: Every evening | ORAL | Status: DC | PRN
  Administered 2023-12-02: 300 mg via ORAL
  Filled 2023-12-02: qty 3

## 2023-12-02 MED ORDER — KETAMINE HCL 50 MG/5ML IJ SOSY
PREFILLED_SYRINGE | INTRAMUSCULAR | Status: DC | PRN
  Administered 2023-12-02: 20 mg via INTRAVENOUS
  Administered 2023-12-02: 30 mg via INTRAVENOUS

## 2023-12-02 MED ORDER — IVABRADINE 2.5 MG HALF TABLET
7.5000 mg | ORAL_TABLET | Freq: Two times a day (BID) | ORAL | Status: DC
Start: 2023-12-02 — End: 2023-12-03
  Administered 2023-12-02: 7.5 mg via ORAL
  Filled 2023-12-02: qty 3

## 2023-12-02 MED ORDER — ONDANSETRON HCL 4 MG/2ML IJ SOLN
INTRAMUSCULAR | Status: DC | PRN
  Administered 2023-12-02: 4 mg via INTRAVENOUS

## 2023-12-02 MED ORDER — MIDAZOLAM HCL 2 MG/2ML IJ SOLN
INTRAMUSCULAR | Status: AC
Start: 2023-12-02 — End: ?
  Filled 2023-12-02: qty 2

## 2023-12-02 MED ORDER — MORPHINE SULFATE (PF) 4 MG/ML IV SOLN: 1.0000 mg | INTRAVENOUS | Status: DC | PRN

## 2023-12-02 MED ORDER — ACETAMINOPHEN 325 MG PO TABS: 650.0000 mg | ORAL_TABLET | Freq: Four times a day (QID) | ORAL | Status: DC | PRN

## 2023-12-02 MED ORDER — FENTANYL CITRATE (PF) 100 MCG/2ML IJ SOLN
INTRAMUSCULAR | Status: DC | PRN
  Administered 2023-12-02 (×2): 50 ug via INTRAVENOUS

## 2023-12-02 MED ORDER — ONDANSETRON HCL 4 MG/2ML IJ SOLN
INTRAMUSCULAR | Status: AC
  Filled 2023-12-02: qty 2

## 2023-12-02 MED ORDER — ALBUTEROL SULFATE HFA 108 (90 BASE) MCG/ACT IN AERS: 1.0000 | INHALATION_SPRAY | RESPIRATORY_TRACT | Status: DC | PRN

## 2023-12-02 MED ORDER — MONTELUKAST SODIUM 10 MG PO TABS
10.0000 mg | ORAL_TABLET | Freq: Every day | ORAL | Status: DC
  Administered 2023-12-02: 10 mg via ORAL
  Filled 2023-12-02: qty 1

## 2023-12-02 MED ORDER — HYDROMORPHONE HCL 1 MG/ML IJ SOLN
INTRAMUSCULAR | Status: DC | PRN
  Administered 2023-12-02: .5 mg via INTRAVENOUS

## 2023-12-02 MED ORDER — FENTANYL CITRATE (PF) 100 MCG/2ML IJ SOLN
100.0000 ug | Freq: Once | INTRAMUSCULAR | Status: AC
  Administered 2023-12-02: 100 ug via INTRAVENOUS

## 2023-12-02 MED ORDER — HYDROCODONE-ACETAMINOPHEN 5-325 MG PO TABS
1.0000 | ORAL_TABLET | ORAL | Status: DC | PRN
  Administered 2023-12-02: 2 via ORAL
  Administered 2023-12-02: 1 via ORAL
  Administered 2023-12-03 (×2): 2 via ORAL
  Filled 2023-12-02: qty 2
  Filled 2023-12-02: qty 1
  Filled 2023-12-02 (×2): qty 2

## 2023-12-02 MED ORDER — EPHEDRINE 5 MG/ML INJ
INTRAVENOUS | Status: AC
  Filled 2023-12-02: qty 5

## 2023-12-02 MED ORDER — SUGAMMADEX SODIUM 200 MG/2ML IV SOLN
INTRAVENOUS | Status: DC | PRN
  Administered 2023-12-02: 200 mg via INTRAVENOUS

## 2023-12-02 MED ORDER — SCOPOLAMINE 1 MG/3DAYS TD PT72
MEDICATED_PATCH | TRANSDERMAL | Status: AC
  Filled 2023-12-02: qty 1

## 2023-12-02 SURGICAL SUPPLY — 73 items
APPLIER CLIP 9.375 MED OPEN (MISCELLANEOUS) ×1
BAG DECANTER FOR FLEXI CONT (MISCELLANEOUS) ×2 IMPLANT
BENZOIN TINCTURE PRP APPL 2/3 (GAUZE/BANDAGES/DRESSINGS) IMPLANT
BINDER BREAST XXLRG (GAUZE/BANDAGES/DRESSINGS) IMPLANT
BIOPATCH RED 1 DISK 7.0 (GAUZE/BANDAGES/DRESSINGS) IMPLANT
BLADE SURG 10 STRL SS (BLADE) ×2 IMPLANT
BLADE SURG 15 STRL LF DISP TIS (BLADE) ×2 IMPLANT
BNDG ELASTIC 6X10 VLCR STRL LF (GAUZE/BANDAGES/DRESSINGS) IMPLANT
CANISTER SUCT 1200ML W/VALVE (MISCELLANEOUS) ×2 IMPLANT
CHLORAPREP W/TINT 26 (MISCELLANEOUS) ×4 IMPLANT
CLIP APPLIE 9.375 MED OPEN (MISCELLANEOUS) IMPLANT
COVER BACK TABLE 60X90IN (DRAPES) ×2 IMPLANT
COVER MAYO STAND STRL (DRAPES) ×2 IMPLANT
COVER PROBE CYLINDRICAL 5X96 (MISCELLANEOUS) ×2 IMPLANT
DERMABOND ADVANCED .7 DNX12 (GAUZE/BANDAGES/DRESSINGS) ×2 IMPLANT
DRAIN CHANNEL 15F RND FF W/TCR (WOUND CARE) IMPLANT
DRAIN CHANNEL 19F RND (DRAIN) ×2 IMPLANT
DRAPE LAPAROSCOPIC ABDOMINAL (DRAPES) ×2 IMPLANT
DRAPE UTILITY XL STRL (DRAPES) ×2 IMPLANT
DRSG IV TEGADERM 3.5X4.5 STRL (GAUZE/BANDAGES/DRESSINGS) IMPLANT
DRSG TEGADERM 2-3/8X2-3/4 SM (GAUZE/BANDAGES/DRESSINGS) IMPLANT
ELECT COATED BLADE 2.86 ST (ELECTRODE) IMPLANT
ELECT REM PT RETURN 9FT ADLT (ELECTROSURGICAL) ×1
ELECTRODE REM PT RTRN 9FT ADLT (ELECTROSURGICAL) ×2 IMPLANT
EVACUATOR SILICONE 100CC (DRAIN) ×2 IMPLANT
GAUZE PAD ABD 8X10 STRL (GAUZE/BANDAGES/DRESSINGS) ×4 IMPLANT
GAUZE SPONGE 4X4 12PLY STRL (GAUZE/BANDAGES/DRESSINGS) IMPLANT
GAUZE SPONGE 4X4 12PLY STRL LF (GAUZE/BANDAGES/DRESSINGS) IMPLANT
GLOVE BIOGEL M STRL SZ7.5 (GLOVE) ×2 IMPLANT
GLOVE SURG SIGNA 7.5 PF LTX (GLOVE) ×2 IMPLANT
GOWN STRL REUS W/ TWL LRG LVL3 (GOWN DISPOSABLE) ×6 IMPLANT
GOWN STRL REUS W/ TWL XL LVL3 (GOWN DISPOSABLE) ×2 IMPLANT
GRAFT FLEX HD 19X22X0.7-1.4 (Tissue) IMPLANT
IMPL EXPANDER BREAST 650CC (Breast) IMPLANT
IMPLANT EXPANDER BREAST 650CC (Breast) ×2 IMPLANT
IV NS 1000ML BAXH (IV SOLUTION) IMPLANT
KIT FILL ASEPTIC TRANSFER (MISCELLANEOUS) IMPLANT
NDL HYPO 25X1 1.5 SAFETY (NEEDLE) ×4 IMPLANT
NEEDLE HYPO 25X1 1.5 SAFETY (NEEDLE) ×2
NS IRRIG 1000ML POUR BTL (IV SOLUTION) ×2 IMPLANT
PACK BASIN DAY SURGERY FS (CUSTOM PROCEDURE TRAY) ×2 IMPLANT
PACK SPY-PHI (KITS) IMPLANT
PENCIL SMOKE EVACUATOR (MISCELLANEOUS) ×2 IMPLANT
PIN SAFETY STERILE (MISCELLANEOUS) ×2 IMPLANT
SHEET MEDIUM DRAPE 40X70 STRL (DRAPES) ×4 IMPLANT
SLEEVE SCD COMPRESS KNEE MED (STOCKING) ×2 IMPLANT
SPONGE T-LAP 18X18 ~~LOC~~+RFID (SPONGE) ×4 IMPLANT
STAPLER INSORB 30 2030 C-SECTI (MISCELLANEOUS) IMPLANT
STAPLER SKIN PROX WIDE 3.9 (STAPLE) ×2 IMPLANT
STRIP CLOSURE SKIN 1/2X4 (GAUZE/BANDAGES/DRESSINGS) ×4 IMPLANT
SUT ETHILON 2 0 FS 18 (SUTURE) IMPLANT
SUT ETHILON 3 0 PS 1 (SUTURE) ×2 IMPLANT
SUT MNCRL AB 4-0 PS2 18 (SUTURE) ×2 IMPLANT
SUT MON AB 3-0 SH27 (SUTURE) IMPLANT
SUT MON AB 4-0 PC3 18 (SUTURE) IMPLANT
SUT MON AB 5-0 PS2 18 (SUTURE) IMPLANT
SUT PDS 3-0 CT2 (SUTURE) ×7
SUT PDS II 3-0 CT2 27 ABS (SUTURE) IMPLANT
SUT SILK 2 0 SH (SUTURE) IMPLANT
SUT STRATAFIX 0 PDS 27 VIOLET (SUTURE) ×2
SUT VIC AB 3-0 SH 27X BRD (SUTURE) ×2 IMPLANT
SUT VICRYL RAPIDE 4-0 (SUTURE) IMPLANT
SUT VLOC 90 P-14 23 (SUTURE) IMPLANT
SUTURE STRATFX 0 PDS 27 VIOLET (SUTURE) IMPLANT
SYR BULB EAR ULCER 3OZ GRN STR (SYRINGE) ×2 IMPLANT
SYR BULB IRRIG 60ML STRL (SYRINGE) ×2 IMPLANT
SYR CONTROL 10ML LL (SYRINGE) ×4 IMPLANT
TOWEL GREEN STERILE FF (TOWEL DISPOSABLE) ×4 IMPLANT
TRACER MAGTRACE VIAL (MISCELLANEOUS) IMPLANT
TRAY FOLEY W/BAG SLVR 14FR LF (SET/KITS/TRAYS/PACK) IMPLANT
TUBE CONNECTING 20X1/4 (TUBING) ×2 IMPLANT
UNDERPAD 30X36 HEAVY ABSORB (UNDERPADS AND DIAPERS) ×4 IMPLANT
YANKAUER SUCT BULB TIP NO VENT (SUCTIONS) ×2 IMPLANT

## 2023-12-02 NOTE — Anesthesia Procedure Notes (Signed)
Anesthesia Regional Block: Pectoralis block   Pre-Anesthetic Checklist: , timeout performed,  Correct Patient, Correct Site, Correct Laterality,  Correct Procedure, Correct Position, site marked,  Risks and benefits discussed,  Surgical consent,  Pre-op evaluation,  At surgeon's request and post-op pain management  Laterality: Right and Upper  Prep: chloraprep       Needles:  Injection technique: Single-shot  Needle Type: Echogenic Needle     Needle Length: 9cm  Needle Gauge: 21     Additional Needles:   Procedures:,,,, ultrasound used (permanent image in chart),,    Narrative:  Start time: 12/02/2023 7:50 AM End time: 12/02/2023 8:00 AM Injection made incrementally with aspirations every 5 mL.  Performed by: Personally  Anesthesiologist: Trevor Iha, MD  Additional Notes: Block assessed. Patient tolerated procedure well.

## 2023-12-02 NOTE — Progress Notes (Signed)
Assisted Dr. Valma Cava with right, pectoralis, ultrasound guided block. Side rails up, monitors on throughout procedure. See vital signs in flow sheet. Tolerated Procedure well.

## 2023-12-02 NOTE — Transfer of Care (Signed)
Immediate Anesthesia Transfer of Care Note  Patient: Kaitlyn Johnson  Procedure(s) Performed: BILATERAL MASTECTOMY WITH RIGHT AXILLARY SENTINEL NODE BIOPSY (Bilateral: Breast) BILATERAL BREAST RECONSTRUCTION WITH PLACEMENT OF TISSUE EXPANDER AND FLEX HD (ACELLULAR HYDRATED DERMIS) (Bilateral: Breast)  Patient Location: PACU  Anesthesia Type:General  Level of Consciousness: awake, alert , and oriented  Airway & Oxygen Therapy: Patient Spontanous Breathing and Patient connected to nasal cannula oxygen  Post-op Assessment: Report given to RN, Post -op Vital signs reviewed and stable, and Patient moving all extremities  Post vital signs: Reviewed and stable  Last Vitals:  Vitals Value Taken Time  BP 141/73 12/02/23 1245  Temp    Pulse 56 12/02/23 1247  Resp 21 12/02/23 1247  SpO2 99 % 12/02/23 1247  Vitals shown include unfiled device data.  Last Pain:  Vitals:   12/02/23 0713  PainSc: 0-No pain         Complications: No notable events documented.

## 2023-12-02 NOTE — Interval H&P Note (Signed)
History and Physical Interval Note: no change in H and P  12/02/2023 7:07 AM  Kaitlyn Johnson  has presented today for surgery, with the diagnosis of RIGHT BREAST CANCER.  The various methods of treatment have been discussed with the patient and family. After consideration of risks, benefits and other options for treatment, the patient has consented to  Procedure(s) with comments: BILATERAL MASTECTOMY WITH RIGHT AXILLARY SENTINEL NODE BIOPSY (Bilateral) - LMA PEC BLOCK BILATERAL BREAST RECONSTRUCTION WITH PLACEMENT OF TISSUE EXPANDER AND FLEX HD (ACELLULAR HYDRATED DERMIS) (Bilateral) as a surgical intervention.  The patient's history has been reviewed, patient examined, no change in status, stable for surgery.  I have reviewed the patient's chart and labs.  Questions were answered to the patient's satisfaction.     Abigail Miyamoto

## 2023-12-02 NOTE — Op Note (Signed)
Operative Note   DATE OF OPERATION: 12/02/2023  SURGICAL DEPARTMENT: Plastic Surgery  LOCATION: [facility]  PREOPERATIVE DIAGNOSES: Right breast cancer  POSTOPERATIVE DIAGNOSES:  same  PROCEDURE: 1.  Bilateral breast reconstruction with tissue expanders and acellular dermal matrix 2.  Indocyanine green angiography of bilateral mastectomy flaps  SURGEON: Ancil Linsey, MD  ASSISTANT: None  ANESTHESIA:  General.   COMPLICATIONS: None.   INDICATIONS FOR PROCEDURE:  The patient, Kaitlyn Johnson is a 43 y.o. female born on 03-Jul-1981, is here for treatment of bilateral mastectomy defects MRN: [id number]  CONSENT:  Informed consent was obtained directly from the patient. Risks, benefits and alternatives were fully discussed. Specific risks including but not limited to bleeding, infection, hematoma, seroma, scarring, pain, contracture, asymmetry, wound healing problems, and need for further surgery were all discussed. The patient did have an ample opportunity to have questions answered to satisfaction.   DESCRIPTION OF PROCEDURE:  The patient was taken to the operating room. SCDs were placed and antibiotics were given.  General anesthesia was administered.  The patient's operative site was prepped and draped in a sterile fashion. A time out was performed and all information was confirmed to be correct.  Dr. Magnus Ivan performed his portion of the case which will be dictated separately.  After he had finished he turned the patient over to me.  I started by examining the mastectomy flaps which clinically looked reasonably good.  Inferiorly on both sides it was a little bit thinner as she had had a previous Wise pattern breast reduction.  Indocyanine green angiography was performed revealing some areas of diminished fill in the inferior skin flap on the left.  I elected to remove all the dark and areas which were excised as additional inferior skin on the left and I did the same  thing on the right side to maintain symmetry.  At this point everything remaining seem to have clinically good vascularity which was confirmed with the spy.  This point I inspected both pockets and ensured meticulous hemostasis which was the case.  A 0 STRATAFIX was used on each side to plicate the lateral pocket to cut down on the dead space.  A 15 French drain was placed on each side and secured with a nylon suture.  At this point the acellular dermal matrix was brought into the field.  Elected to use a Flex HD pliable prepiece on each side.  The matrix was brought onto the field and rinsed in saline according to the instructions.  A 3-0 PDS was run around the periphery.  Expanders were then brought onto the field.  I elected to use Mentor Arturo plus smooth ultrahigh profile tissue expanders with a prescribed fill volume of 650 cc.  Serial number on the left side [id number].  Serial number on the right side [id number].  All the air was removed from the expanders and they were placed within the matrix and the pursestring was tied down.  Suture tabs were brought out at 3, 6, 9:00.  I then irrigated both pockets with antibiotic solution.  Stay sutures were placed in the chest wall at the corresponding locations and then passed back through the suture tabs.  The expanders were then placed and the suture tabs secured with a 3-0 PDS sutures.  Both expanders were inflated to 200 cc of saline which did not place any undue tension on the skin.  Skin closure was done with a combination of 3-0 PDS and INSORB staples and a  running 3 oh STRATAFIX subcuticular.  This gave a very nice on table result.  Benzoin Steri-Strips and a soft chest wrap were applied.  The patient tolerated the procedure well.  There were no complications. The patient was allowed to wake from anesthesia, extubated and taken to the recovery room in satisfactory condition.

## 2023-12-02 NOTE — Anesthesia Postprocedure Evaluation (Signed)
Anesthesia Post Note  Patient: Kaitlyn Johnson  Procedure(s) Performed: BILATERAL MASTECTOMY WITH RIGHT AXILLARY SENTINEL NODE BIOPSY (Bilateral: Breast) BILATERAL BREAST RECONSTRUCTION WITH PLACEMENT OF TISSUE EXPANDER AND FLEX HD (ACELLULAR HYDRATED DERMIS) (Bilateral: Breast)     Patient location during evaluation: PACU Anesthesia Type: General Level of consciousness: awake and alert Pain management: pain level controlled Vital Signs Assessment: post-procedure vital signs reviewed and stable Respiratory status: spontaneous breathing, nonlabored ventilation, respiratory function stable and patient connected to nasal cannula oxygen Cardiovascular status: blood pressure returned to baseline and stable Postop Assessment: no apparent nausea or vomiting Anesthetic complications: no   No notable events documented.  Last Vitals:  Vitals:   12/02/23 1330 12/02/23 1400  BP: 117/70 121/70  Pulse: 60 60  Resp: (!) 23 18  Temp:  (!) 36.4 C  SpO2: 98% 100%    Last Pain:  Vitals:   12/02/23 1400  PainSc: 4                  Trevor Iha

## 2023-12-02 NOTE — Op Note (Signed)
[name] 12/02/2023   Pre-op Diagnosis: RIGHT BREAST CANCER     Post-op Diagnosis: same  Procedure(s): BILATERAL TOTAL MASTECTOMY  RIGHT AXILLARY DEEP SENTINEL NODE BIOPSY INJECTION OF MAG TRACE BILATERALLY FOR LYMPH NODE MAPPING  Surgeon:  Abigail Miyamoto, MD  Assist: Koleen Nimrod, PA   Anesthesia: General  Staff:  Circulator: Maryan Rued, RN Scrub Person: Rolla Etienne  Estimated Blood Loss               Specimens: sent to path  Indications: This is a 43 year old female was found recently mammography to have a mass at the 10 o'clock position of the right breast measuring 9 mm.  Ultrasound the axilla was unremarkable.  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  She has had previous bilateral breast reduction.  After long discussion with the patient and her husband they wish [place] to proceed with a bilateral total mastectomy and right axillary sentinel lymph node biopsy with immediate reconstruction by plastic surgery.  After discussion, we will inject mag trace into the right breast for sentinel lymph node biopsy as well as in the left breast just in case a malignancy is found in the left breast on final pathologic evaluation.  Procedure: The patient was brought to the operating identifies the correct patient.  She was placed supine on the operating room table and general anesthesia was induced.  Her bilateral breast and axilla were then prepped and draped in the usual sterile fashion.  I injected mag trace under sterile technique under both nipple areolar complexes and massaged both breasts.  Her bilateral breast and axilla were then prepped and draped in the usual sterile fashion.  We started on the left breast mastectomy first.  I created an elliptical incision transversely across the breast incorporating the nipple areolar complex.  I then dissected down to the breast tissue circumferentially with electrocautery.  I then dissected out the  superior skin flap staying underneath the skin and dermis going superiorly to the upper chest and then down to the chest wall.  I then dissected the inferior skin flap going down to the inframammary ridge staying just under the skin as well with the cautery.  I then took the dissection laterally toward the lateral breast and axilla with the cautery circumferentially continuing the skin flaps and uniform.  I then slowly dissected the breast off of the chest wall dissecting medial to lateral of the pectoralis fascia with the electrocautery.  Once the mastectomy was completed I marked the lateral margin with a suture.  It was then sent to pathology for evaluation.  Some extra breast tissue of the lower flap along the chest wall medially was excised and sent with the specimen as well.  We irrigated the chest wall with saline.  Hemostasis peer to be achieved.  We the left a laparotomy pad at the mastectomy site.  I next made an elliptical incision on the right breast corresponding to the incision on the left breast transversely incorporating the nipple areolar complex.  I then again dissected down into the breast tissue circumferentially with the cautery.  I again performed the superior skin flap for staying underneath the skin and dermis going to the upper chest wall and then down to the chest wall.  I then dissected out the inferior skin flap as well staying underneath the skin and dermis going to the inframammary ridge.  I took the dissection again laterally creating the skin flaps all the way around  toward the axilla with the cautery.  I again slowly dissected the breast tissue off of the chest wall dissecting medial to laterally and completed the mastectomy laterally past the edge of the pectoralis muscle.  I again marked the margin laterally with a silk suture.  Using the mag trace probe I was then able to identify an area of increased uptake in the right axilla.  With the cautery and surgical clips I dissected  down to the deep axillary tissue.  I could easily palpate and visually identify 2 lymph nodes which I excised together with the surrounding fat using the cautery.  There was significant uptake of mag trace in these lymph nodes and they were sent as the deep axillary sentinel lymph nodes on the right side.  No other increased uptake of mag trace in the axilla was identified with the probe.  There were also no palpable lymph nodes.  We irrigated the right chest wall and axilla and hemostasis appeared to be achieved.  I then palpated the flaps and there was some extra breast tissue medially in the upper flap on the right which I excised with the cautery and sent with the right mastectomy specimen.  We then placed a dry laparotomy pad at the mastectomy site. At this point, Dr. Arita Miss presented to the room to start his portion of the bilateral immediate reconstruction.  She remained hemodynamically stable.  He will dictate the remaining portion of the procedure          Abigail Miyamoto   Date: 12/02/2023  Time: 10:33 AM

## 2023-12-02 NOTE — Anesthesia Procedure Notes (Signed)
Procedure Name: Intubation Date/Time: 12/02/2023 9:13 AM  Performed by: Yolanda Bonine, CRNAPre-anesthesia Checklist: Patient identified, Emergency Drugs available, Suction available, Patient being monitored and Timeout performed Patient Re-evaluated:Patient Re-evaluated prior to induction Oxygen Delivery Method: Circle system utilized Preoxygenation: Pre-oxygenation with 100% oxygen Induction Type: IV induction Ventilation: Mask ventilation without difficulty Laryngoscope Size: Mac and 3 Grade View: Grade I Tube type: Oral Tube size: 7.0 mm Number of attempts: 1 Placement Confirmation: ETT inserted through vocal cords under direct vision, positive ETCO2 and breath sounds checked- equal and bilateral Secured at: 21 cm Tube secured with: Tape Dental Injury: Teeth and Oropharynx as per pre-operative assessment

## 2023-12-03 ENCOUNTER — Encounter (HOSPITAL_BASED_OUTPATIENT_CLINIC_OR_DEPARTMENT_OTHER): Payer: Self-pay | Admitting: Surgery

## 2023-12-03 DIAGNOSIS — J45909 Unspecified asthma, uncomplicated: Secondary | ICD-10-CM | POA: Diagnosis not present

## 2023-12-03 DIAGNOSIS — D0511 Intraductal carcinoma in situ of right breast: Secondary | ICD-10-CM | POA: Diagnosis not present

## 2023-12-03 DIAGNOSIS — Z79899 Other long term (current) drug therapy: Secondary | ICD-10-CM | POA: Diagnosis not present

## 2023-12-03 DIAGNOSIS — K3184 Gastroparesis: Secondary | ICD-10-CM | POA: Diagnosis not present

## 2023-12-03 NOTE — Discharge Instructions (Addendum)
 Leave dressing until Wednesday.  May then remove and shower.  Empty and record drain output as instructed.  Bring that information to your follow-up visit.  Avoid tight wraps to avoid too much pressure on the skin.    Post Anesthesia Home Care Instructions  Activity: Get plenty of rest for the remainder of the day. A responsible individual must stay with you for 24 hours following the procedure.  For the next 24 hours, DO NOT: -Drive a car -Advertising copywriter -Drink alcoholic beverages -Take any medication unless instructed by your physician -Make any legal decisions or sign important papers.  Meals: Start with liquid foods such as gelatin or soup. Progress to regular foods as tolerated. Avoid greasy, spicy, heavy foods. If nausea and/or vomiting occur, drink only clear liquids until the nausea and/or vomiting subsides. Call your physician if vomiting continues.  Special Instructions/Symptoms: Your throat may feel dry or sore from the anesthesia or the breathing tube placed in your throat during surgery. If this causes discomfort, gargle with warm salt water . The discomfort should disappear within 24 hours.  If you had a scopolamine  patch placed behind your ear for the management of post- operative nausea and/or vomiting:  1. The medication in the patch is effective for 72 hours, after which it should be removed.  Wrap patch in a tissue and discard in the trash. Wash hands thoroughly with soap and water . 2. You may remove the patch earlier than 72 hours if you experience unpleasant side effects which may include dry mouth, dizziness or visual disturbances. 3. Avoid touching the patch. Wash your hands with soap and water  after contact with the patch.     Information for Discharge Teaching: EXPAREL  (bupivacaine  liposome injectable suspension)   Pain relief is important to your recovery. The goal is to control your pain so you can move easier and return to your normal activities as soon as  possible after your procedure. Your physician may use several types of medicines to manage pain, swelling, and more.  Your surgeon or anesthesiologist gave you EXPAREL (bupivacaine ) to help control your pain after surgery.  EXPAREL  is a local anesthetic designed to release slowly over an extended period of time to provide pain relief by numbing the tissue around the surgical site. EXPAREL  is designed to release pain medication over time and can control pain for up to 72 hours. Depending on how you respond to EXPAREL , you may require less pain medication during your recovery. EXPAREL  can help reduce or eliminate the need for opioids during the first few days after surgery when pain relief is needed the most. EXPAREL  is not an opioid and is not addictive. It does not cause sleepiness or sedation.   Important! A teal colored band has been placed on your arm with the date, time and amount of EXPAREL  you have received. Please leave this armband in place for the full 96 hours following administration, and then you may remove the band. If you return to the hospital for any reason within 96 hours following the administration of EXPAREL , the armband provides important information that your health care providers to know, and alerts them that you have received this anesthetic.    Possible side effects of EXPAREL : Temporary loss of sensation or ability to move in the area where medication was injected. Nausea, vomiting, constipation Rarely, numbness and tingling in your mouth or lips, lightheadedness, or anxiety may occur. Call your doctor right away if you think you may be experiencing any of these sensations,  or if you have other questions regarding possible side effects.  Follow all other discharge instructions given to you by your surgeon or nurse. Eat a healthy diet and drink plenty of water  or other fluids.  Regional Anesthesia Blocks  1. You may not be able to move or feel the blocked extremity  after a regional anesthetic block. This may last may last from 3-48 hours after placement, but it will go away. The length of time depends on the medication injected and your individual response to the medication. As the nerves start to wake up, you may experience tingling as the movement and feeling returns to your extremity. If the numbness and inability to move your extremity has not gone away after 48 hours, please call your surgeon.   2. The extremity that is blocked will need to be protected until the numbness is gone and the strength has returned. Because you cannot feel it, you will need to take extra care to avoid injury. Because it may be weak, you may have difficulty moving it or using it. You may not know what position it is in without looking at it while the block is in effect.  3. For blocks in the legs and feet, returning to weight bearing and walking needs to be done carefully. You will need to wait until the numbness is entirely gone and the strength has returned. You should be able to move your leg and foot normally before you try and bear weight or walk. You will need someone to be with you when you first try to ensure you do not fall and possibly risk injury.  4. Bruising and tenderness at the needle site are common side effects and will resolve in a few days.  5. Persistent numbness or new problems with movement should be communicated to the surgeon or the [facility] [phone] [facility] [phone]).  About my Jackson-Pratt Bulb Drain  What is a Jackson-Pratt bulb? A Jackson-Pratt is a soft, round device used to collect drainage. It is connected to a long, thin drainage catheter, which is held in place by one or two small stiches near your surgical incision site. When the bulb is squeezed, it forms a vacuum, forcing the drainage to empty into the bulb.  Emptying the Jackson-Pratt bulb- To empty the bulb: 1. Release the plug on the top of the  bulb. 2. Pour the bulb's contents into a measuring container which your nurse will provide. 3. Record the time emptied and amount of drainage. Empty the drain(s) as often as your     doctor or nurse recommends.  Date                  Time                    Amount (Drain 1)                 Amount (Drain 2)  _____________________________________________________________________  _____________________________________________________________________  _____________________________________________________________________  _____________________________________________________________________  _____________________________________________________________________  _____________________________________________________________________  _____________________________________________________________________  _____________________________________________________________________  Squeezing the Jackson-Pratt Bulb- To squeeze the bulb: 1. Make sure the plug at the top of the bulb is open. 2. Squeeze the bulb tightly in your fist. You will hear air squeezing from the bulb. 3. Replace the plug while the bulb is squeezed. 4. Use a safety pin to attach the bulb to your clothing. This will keep the catheter from     pulling at the bulb insertion  site.  When to call your doctor- Call your doctor if: Drain site becomes red, swollen or hot. You have a fever greater than 101 degrees F. There is oozing at the drain site. Drain falls out (apply a guaze bandage over the drain hole and secure it with tape). Drainage increases daily not related to activity patterns. (You will usually have more drainage when you are active than when you are resting.) Drainage has a bad odor.   Last received Norco at 620am

## 2023-12-03 NOTE — Progress Notes (Signed)
 Patient doing well this morning.  Some discomfort typical for this point postop.  On exam her skin looks healthy at this point.  Skin is not tight.  Slightly more fullness on the right side but that is consistent with intraoperative flap thickness.  Symmetric and appropriate drain output.  Dressing is not tight.  Patient is ready for discharge.  We discussed follow-up plans.  All of her questions were answered.

## 2023-12-04 DIAGNOSIS — K3184 Gastroparesis: Secondary | ICD-10-CM | POA: Diagnosis not present

## 2023-12-05 ENCOUNTER — Telehealth: Payer: Self-pay | Admitting: Genetic Counselor

## 2023-12-05 DIAGNOSIS — K3184 Gastroparesis: Secondary | ICD-10-CM | POA: Diagnosis not present

## 2023-12-05 NOTE — Telephone Encounter (Signed)
 Revealed negative genetic testing on the BRCAPlus panel.  The remainder of testing is still pending.  We will call when the remainder of testing is available.

## 2023-12-06 DIAGNOSIS — K3184 Gastroparesis: Secondary | ICD-10-CM | POA: Diagnosis not present

## 2023-12-07 DIAGNOSIS — K3184 Gastroparesis: Secondary | ICD-10-CM | POA: Diagnosis not present

## 2023-12-08 DIAGNOSIS — K3184 Gastroparesis: Secondary | ICD-10-CM | POA: Diagnosis not present

## 2023-12-09 DIAGNOSIS — K3184 Gastroparesis: Secondary | ICD-10-CM | POA: Diagnosis not present

## 2023-12-09 LAB — SURGICAL PATHOLOGY

## 2023-12-10 ENCOUNTER — Encounter: Payer: Self-pay | Admitting: *Deleted

## 2023-12-10 ENCOUNTER — Telehealth: Payer: Self-pay | Admitting: *Deleted

## 2023-12-10 ENCOUNTER — Encounter: Payer: Self-pay | Admitting: Family Medicine

## 2023-12-10 ENCOUNTER — Telehealth: Payer: Self-pay | Admitting: Hematology and Oncology

## 2023-12-10 DIAGNOSIS — K3184 Gastroparesis: Secondary | ICD-10-CM | POA: Diagnosis not present

## 2023-12-10 NOTE — Telephone Encounter (Signed)
Received order for Oncotype Testing per Dr. Al Pimple. Requisition sent to pathology

## 2023-12-10 NOTE — Discharge Summary (Signed)
 Discharge Summary Patient Name: Kaitlyn Johnson MRN: [id number] DOB: 10-24-2016 Admission Date: 12-02-2023 Discharge Date: 12-03-2023 Attending Physician: Craig Topher Buenaventura MD Primary Care Physician (PCP): [Unknown Consulting Services: None Discharge Disposition: Home Discharge Diagnosis: Breast cancer Secondary Diagnoses: None  Patient had bilateral mastectomies with immediate reconstruction.  She stayed overnight for observation.  She did great and is stable for transfer home with no changes to her medications.

## 2023-12-10 NOTE — Telephone Encounter (Signed)
Marland Kitchen

## 2023-12-11 DIAGNOSIS — K3184 Gastroparesis: Secondary | ICD-10-CM | POA: Diagnosis not present

## 2023-12-12 DIAGNOSIS — K3184 Gastroparesis: Secondary | ICD-10-CM | POA: Diagnosis not present

## 2023-12-13 DIAGNOSIS — K3184 Gastroparesis: Secondary | ICD-10-CM | POA: Diagnosis not present

## 2023-12-14 DIAGNOSIS — K3184 Gastroparesis: Secondary | ICD-10-CM | POA: Diagnosis not present

## 2023-12-15 DIAGNOSIS — K3184 Gastroparesis: Secondary | ICD-10-CM | POA: Diagnosis not present

## 2023-12-16 ENCOUNTER — Telehealth (INDEPENDENT_AMBULATORY_CARE_PROVIDER_SITE_OTHER): Payer: BC Managed Care – PPO | Admitting: Family Medicine

## 2023-12-16 VITALS — Wt 198.0 lb

## 2023-12-16 DIAGNOSIS — G8929 Other chronic pain: Secondary | ICD-10-CM

## 2023-12-16 DIAGNOSIS — C50411 Malignant neoplasm of upper-outer quadrant of right female breast: Secondary | ICD-10-CM | POA: Diagnosis not present

## 2023-12-16 DIAGNOSIS — Z9013 Acquired absence of bilateral breasts and nipples: Secondary | ICD-10-CM | POA: Diagnosis not present

## 2023-12-16 DIAGNOSIS — R109 Unspecified abdominal pain: Secondary | ICD-10-CM

## 2023-12-16 DIAGNOSIS — K3184 Gastroparesis: Secondary | ICD-10-CM | POA: Diagnosis not present

## 2023-12-16 MED ORDER — ONDANSETRON 4 MG PO TBDP: 4.0000 mg | ORAL_TABLET | Freq: Three times a day (TID) | ORAL | 0 refills | Status: DC | PRN

## 2023-12-16 MED ORDER — TRAMADOL HCL 50 MG PO TABS: 100.0000 mg | ORAL_TABLET | Freq: Three times a day (TID) | ORAL | 5 refills | Status: AC | PRN

## 2023-12-16 NOTE — Progress Notes (Unsigned)
Virtual Visit via Video Note  I connected with Kaitlyn Johnson on 12/16/2023 at  1:00 PM EST by a video enabled telemedicine application and verified that I am speaking with the correct person using two identifiers.  Location: Patient: home Provider: office   I discussed the limitations of evaluation and management by telemedicine and the availability of in person appointments. The patient expressed understanding and agreed to proceed.  History of Present Illness:  Chief Complaint  Patient presents with   Medical Management of Chronic Issues    tramadol for abd pain,   Discussed the use of AI scribe software for clinical note transcription with the patient, who gave verbal consent to proceed.  History of Present Illness   Kaitlyn Johnson is a 43 year old female who presents for to discuss pain management for chronic abdominal pain.    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 Previously, her pain was managed with tramadol 100 mg 3 times daily.    She is also recovering from a bilateral mastectomy and right axillary sentinel lymph node biopsy performed due to invasive carcinoma of the right breast and family history concerns. Kaitlyn Johnson is present in the armpits, but she has regained her range of motion.  For postoperative pain management, she was initially prescribed hydrocodone but switched back to tramadol due to constipation from hydrocodone. She currently takes 100 mg tramadol, two to three times a day, supplemented with Tylenol as needed. Pain levels have returned to baseline, similar to pre-surgery levels for chronic abdominal pain.  Her abdominal motility disorder is are also accompanied with refractory nausea and vomiting, particularly when attempting to eat solid foods, such as a scrambled egg, which she was unable to keep down. She continues to rely on Kaitlyn Johnson 1.0 peptide formula via G-tube nutrition,  consuming three canisters over twelve hours daily. Her current weight is 198 pounds.  She occasionally uses Zofran for nausea, approximately once or twice a week, and has requested a refill. She is also on clonazepam, which she has not noticed to help with nausea.        Observations/Objective: Weight: 198 lb (89.8 kg)    Gen: NAD, resting comfortably HEENT: EOMI Pulm: NWOB Skin: no rash on face Neuro: no facial asymmetry or dysmetria Psych: Normal affect   Assessment and Plan: Problem List Items Addressed This Visit       Digestive   Gastroparesis   Relevant Medications   ondansetron (ZOFRAN-ODT) 4 MG disintegrating tablet     Other   Chronic abdominal pain - Primary   Relevant Medications   traMADol (ULTRAM) 50 MG tablet   Breast cancer (HCC)   Relevant Medications   ondansetron (ZOFRAN-ODT) 4 MG disintegrating tablet   Status post bilateral mastectomy   Assessment and Plan    Postoperative Recovery from Bilateral Mastectomy Recovering well from bilateral mastectomy with right axillary sentinel lymph node biopsy. Reports Kaitlyn Johnson in armpits but has regained range of motion. Drains are scheduled to be removed tomorrow. No significant complications noted. - Continue current pain management regimen - Remove surgical drains as scheduled  Invasive Carcinoma of the Right Breast Surgical pathology confirmed invasive carcinoma in the right breast. Bilateral mastectomy was performed due to family history. Follow-up with oncology for further management. - Follow up with oncology for further management  Nausea and Vomiting Experiences nausea and vomiting, especially after eating. Unable to tolerate solid foods like scrambled eggs. Currently on Kaitlyn Johnson 1.0 peptide formula,  3 canisters over 12 hours. Weight is 198 lbs. Zofran 4 mg ODT used as needed, approximately twice a week. Discussed potential use of shorter-acting benzodiazepines for nausea relief, though caution is  advised due to dependency risks. - Prescribe Zofran 4 mg ODT, 30 tablets - Continue Kaitlyn Johnson 1.0 peptide formula, 3 canisters over 12 hours - Monitor nutritional status closely  Chronic Abdominal Pain Chronic abdominal pain managed with tramadol. Post-surgery, pain levels have returned to baseline. Currently using 50 mg tramadol, 2-3 tablets, 2-3 times a day, supplemented with Tylenol. No significant side effects reported. Discussed risk of opioid dependence and potential benefit of using Tylenol in conjunction with tramadol for enhanced pain relief. - Prescribe tramadol 100 mg, 3 times daily for 6 months - Recommend using Tylenol in conjunction with tramadol for enhanced pain relief - Monitor for signs of opioid dependence  Follow-up - Follow up with motility specialist on May 14th - Follow up with in six months to discuss pain management       Medications Discontinued During This Encounter  Medication Reason   ondansetron (ZOFRAN-ODT) 4 MG disintegrating tablet Reorder     Follow Up Instructions:     I discussed the assessment and treatment plan with the patient. The patient was provided an opportunity to ask questions and all were answered. The patient agreed with the plan and demonstrated an understanding of the instructions.   The patient was advised to call back or seek an in-person evaluation if the symptoms worsen or if the condition fails to improve as anticipated. Garnette Gunner, MD

## 2023-12-17 DIAGNOSIS — Z9013 Acquired absence of bilateral breasts and nipples: Secondary | ICD-10-CM | POA: Insufficient documentation

## 2023-12-17 DIAGNOSIS — K3184 Gastroparesis: Secondary | ICD-10-CM | POA: Diagnosis not present

## 2023-12-18 ENCOUNTER — Telehealth: Payer: Self-pay | Admitting: *Deleted

## 2023-12-18 ENCOUNTER — Encounter: Payer: Self-pay | Admitting: Genetic Counselor

## 2023-12-18 ENCOUNTER — Encounter (HOSPITAL_COMMUNITY): Payer: Self-pay

## 2023-12-18 ENCOUNTER — Encounter: Payer: Self-pay | Admitting: *Deleted

## 2023-12-18 DIAGNOSIS — C50411 Malignant neoplasm of upper-outer quadrant of right female breast: Secondary | ICD-10-CM | POA: Diagnosis not present

## 2023-12-18 DIAGNOSIS — K3184 Gastroparesis: Secondary | ICD-10-CM | POA: Diagnosis not present

## 2023-12-18 DIAGNOSIS — Z1589 Genetic susceptibility to other disease: Secondary | ICD-10-CM | POA: Insufficient documentation

## 2023-12-18 DIAGNOSIS — Z170 Estrogen receptor positive status [ER+]: Secondary | ICD-10-CM | POA: Diagnosis not present

## 2023-12-18 NOTE — Telephone Encounter (Signed)
Received oncotype results of 14/4%. Patient has appt with Dr. Al Pimple 3/3

## 2023-12-19 ENCOUNTER — Telehealth: Payer: Self-pay | Admitting: Genetic Counselor

## 2023-12-19 ENCOUNTER — Ambulatory Visit: Payer: Self-pay | Admitting: Genetic Counselor

## 2023-12-19 ENCOUNTER — Ambulatory Visit: Payer: BC Managed Care – PPO | Admitting: Family Medicine

## 2023-12-19 ENCOUNTER — Ambulatory Visit: Payer: BC Managed Care – PPO | Admitting: Hematology and Oncology

## 2023-12-19 DIAGNOSIS — Z1379 Encounter for other screening for genetic and chromosomal anomalies: Secondary | ICD-10-CM

## 2023-12-19 DIAGNOSIS — L821 Other seborrheic keratosis: Secondary | ICD-10-CM | POA: Diagnosis not present

## 2023-12-19 DIAGNOSIS — B078 Other viral warts: Secondary | ICD-10-CM | POA: Diagnosis not present

## 2023-12-19 DIAGNOSIS — L814 Other melanin hyperpigmentation: Secondary | ICD-10-CM | POA: Diagnosis not present

## 2023-12-19 DIAGNOSIS — D225 Melanocytic nevi of trunk: Secondary | ICD-10-CM | POA: Diagnosis not present

## 2023-12-19 DIAGNOSIS — Z1589 Genetic susceptibility to other disease: Secondary | ICD-10-CM

## 2023-12-19 DIAGNOSIS — K3184 Gastroparesis: Secondary | ICD-10-CM | POA: Diagnosis not present

## 2023-12-19 DIAGNOSIS — Z872 Personal history of diseases of the skin and subcutaneous tissue: Secondary | ICD-10-CM | POA: Diagnosis not present

## 2023-12-19 NOTE — Progress Notes (Signed)
GENETIC TEST RESULTS   Patient Name: Isella Slatten Patient Age: 43 y.o. Encounter Date: 12/19/2023  Referring Provider: Abigail Miyamoto, MD    Ms. Plasencia was seen in the Cancer Genetics clinic on November 27, 2023 due to a personal and family history of cancer and concern regarding a hereditary predisposition to cancer in the family. Please refer to the prior Genetics clinic note for more information regarding Ms. Ireland's medical and family histories and our assessment at the time.   FAMILY HISTORY:  We obtained a detailed, 4-generation family history.  Significant diagnoses are listed below: Family History  Problem Relation Age of Onset   Hypertension Mother    Thyroid cancer Maternal Uncle        dx 65s   Stroke Paternal Aunt    Colon cancer Maternal Grandmother        dx 49s   Heart attack Maternal Grandfather    Aortic aneurysm Paternal Grandfather    Breast cancer Other        MGMs sister, d. 34s       The patient has a set of twins that were conceived by donor egg and are healthy.  She is an only child.  Both parents are living.   The patient's father had one sister who died of a stroke.  His mother is living and his father is deceased.   The patient's mother has two sisters and a brother.  Her brother had thyroid cancer in her 7's.  Her mother had colon cancer in her 72's and a maternal aunt had breast cancer.   Ms. Novell is unaware of previous family history of genetic testing for hereditary cancer risks.  There is no reported Ashkenazi Jewish ancestry. 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 The test report has been scanned into EPIC and is located under the Molecular Pathology section of the Results Review tab.  A portion of the result report is included below for reference. Genetic testing  reported out on .   Clinical Information: Hereditary pancreatitis is associated with increased lifetime risk of pancreatic cancer due to a hereditary mutation in a gene that increases the risk for pancreatitis.  This can be associated with inherited mutations in one gene, or possibly more than one gene.  When there is more than one pathogenic variant identified, the risk for pancreatitis could be additive.  The cancers associated with SPINK1 are:  Pancreatic cancer, increased risk Increased risk for chronic pancreatitis after acute pancreatitis. The risk of pancreatic cancer in patients with chronic pancreatitis associated with a single SPINK1 p.N34S, alone, is not high enough to justify screening or surveillance. Ms. Mcgonagle has this mutation.   The cancers associated with a single disease modifying variant in CFTR are:  Pancreatic cancer, unclear risk  Pancreatitis has been associated individually with SPINK1 mutations and CFTR mutations.  The complication lies in the specific mutations that Ms. Wise has. The SPINK1 p.N34S mutation identified in Ms. Blincoe is a founder mutation. Pathogenic mutations in SPINK1 have been associated with chronic pancreatitis, which can sometimes lead to pancreatic cancer.    Additionally, mutations within CFTR have differences in the presentation of Cystic Fibrosis - with some mutations associated with classic disease and others causing milder symptoms.  The mutation found in  Ms. Wynn causes mild symptoms with one specific CF mutation, but  is typically not associated with Cystic fibrosis when combined with other mutations. While CF mutations in general, combined with SPINK1, has been associated with an increased risk for pancreatic cancer, the risk for pancreatic cancer combining SPINK1 and this lower risk allele is unknown.    In summary, Ms. 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  While cancer risks have not been well-defined for these genes, the combination of mutations in these genes has been associated with pancreatitis.  Management Recommendations:  Pancreatic Cancer Screening/Risk Reduction: Avoid smoking, heavy alcohol use, and obesity. Ideally, screening should be performed in experienced centers utilizing a multidisciplinary approach under research conditions. Recommended screening could include annual endoscopic ultrasound (preferred) and/or MRI of the pancreas starting at age 26 or 20 years after onset of pancreatitis, whichever is earlier. CA19-9 testing may be considered based on the physician's discretion.   This information is based on current understanding of the gene(s) and may change in the future.   Implications for Family Members: Hereditary predisposition to pancreatitis due to pathogenic variants in the SPINK1 and CFTR genes has a polygenic inheritance pattern. This means that an individual with a pathogenic variant in SPINK1 and CFTR has a 50% chance of passing each gene on to his/her offspring. If only one mutation is inherited, the risk for pancreatitis may be lower than if both mutations are inherited. There is a 25% chance that both mutations would be passed on to their offspring. Identification of a pathogenic variant allows for the recognition of at-risk relatives who can pursue testing for the familial variant.  Individuals with two pathogenic variants could have inherited both mutations from one parent, or once mutation from each parent. It is recommended that Ms. Coldwell's parents undergo genetic testing.  Individuals with a single CFTR mutation are carriers of Cystic Fibrosis (CF).  CF is an autosomal recessive condition that is characterized by early onset lung disease, failure to thrive due to pancreatic insufficiency, acute and/or recurrent pancreatitis, and infertility in males. Approximately 1 in 20-25 individuals are  carriers of CF.  Family members are encouraged to consider genetic testing for this familial pathogenic variant. The risk to family members of developing pancreatitis depends on several variables including: genetic risk factors, smoking, alcohol use, gender, and developmental differences such as pancreas divisum (incomplete pancreatic duct development resulting in two duct systems rather than one, with most of the pancreas draining through a high-resistance papilla), as well as unknown environmental and genetic risk factors.  As there are generally no childhood cancer risks associated with pathogenic variants in the SPINK1 and CFTR gene, individuals in the family are not recommended to have testing until they reach at least 41 years of age. They may contact our office at [phone] for more information or to schedule an appointment.  Complimentary testing for the familial variant is available for 90 days.  Family members who live outside of the area are encouraged to find a genetic counselor in their area by visiting: [email or web address].  Plan: Medical management for her breast cancer will not change due to these mutations. A referral do discuss high risk pancreatic cancer screening will be made.  Although the patient has received her GI care through [facility], she requests being sent to [facility] in [place] for the initial consultation.  We encouraged Ms. Seabolt to remain in contact with [place] on an annual basis so we can update her  personal and family histories, and let her know of advances in cancer genetics that may benefit the family. Our contact number was provided. Ms. Leib questions were answered to her satisfaction today, and she knows she is welcome to call anytime with additional questions.   Lillionna Nabi P. Lowell Guitar, MS, [facility] Licensed, Patent attorney [email or web address] phone: [phone]

## 2023-12-19 NOTE — Telephone Encounter (Signed)
Lm on VM that results are back and to please call.  Left CB instructions.

## 2023-12-19 NOTE — Telephone Encounter (Signed)
Revealed negative genetic testing for breast cancer but that she is positive for a SPINK1 and CF mutation that increase the risk for pancreatitis.  In light of the fact that she had an acute pancreatitis at a ge 32, we will refer to the high risk pancreatic cancer screening program.

## 2023-12-20 DIAGNOSIS — K3184 Gastroparesis: Secondary | ICD-10-CM | POA: Diagnosis not present

## 2023-12-21 DIAGNOSIS — K3184 Gastroparesis: Secondary | ICD-10-CM | POA: Diagnosis not present

## 2023-12-22 DIAGNOSIS — K3184 Gastroparesis: Secondary | ICD-10-CM | POA: Diagnosis not present

## 2023-12-23 DIAGNOSIS — K3184 Gastroparesis: Secondary | ICD-10-CM | POA: Diagnosis not present

## 2023-12-24 DIAGNOSIS — K3184 Gastroparesis: Secondary | ICD-10-CM | POA: Diagnosis not present

## 2023-12-25 ENCOUNTER — Ambulatory Visit: Payer: BC Managed Care – PPO | Attending: Surgery | Admitting: Physical Therapy

## 2023-12-25 ENCOUNTER — Encounter: Payer: Self-pay | Admitting: Physical Therapy

## 2023-12-25 DIAGNOSIS — Z170 Estrogen receptor positive status [ER+]: Secondary | ICD-10-CM | POA: Insufficient documentation

## 2023-12-25 DIAGNOSIS — R293 Abnormal posture: Secondary | ICD-10-CM | POA: Diagnosis not present

## 2023-12-25 DIAGNOSIS — K3184 Gastroparesis: Secondary | ICD-10-CM | POA: Diagnosis not present

## 2023-12-25 DIAGNOSIS — C50911 Malignant neoplasm of unspecified site of right female breast: Secondary | ICD-10-CM | POA: Diagnosis not present

## 2023-12-25 NOTE — Therapy (Signed)
 OUTPATIENT PHYSICAL THERAPY BREAST CANCER POST OP FOLLOW UP   Patient Name: Kaitlyn Johnson MRN: [id number] DOB:October 21, 1981, 43 y.o., female Today's Date: 12/25/2023  END OF SESSION:  PT End of Session - 12/25/23 0900     Visit Number 2    Number of Visits 2    Date for PT Re-Evaluation 01/03/24    PT Start Time 0900    PT Stop Time 0917    PT Time Calculation (min) 17 min    Activity Tolerance Patient tolerated treatment well    Behavior During Therapy [facility] for tasks assessed/performed             Past Medical History:  Diagnosis Date   Acute appendicitis 02/28/2017   Acute pancreatitis 11/30/2013   Anemia 11/26/2011   Anxiety    Asthma    Celiac and mesenteric artery injury    Cushing's syndrome (HCC)    06/21/16- "in remission"   Depression    Family history of breast cancer    Family history of colon cancer    H/O hiatal hernia    History of kidney stones    Iatrogenic Cushing's syndrome (HCC) 10/02/2013   Nausea vomiting and diarrhea 11/23/2013   Nausea with vomiting 11/30/2013   Palpitations 12/05/2018   Pancreatitis 11/29/2013   POTS (postural orthostatic tachycardia syndrome)    Shortness of breath dyspnea    with exertertion   Sphincter of Oddi dysfunction    Past Surgical History:  Procedure Laterality Date   ABDOMINAL HYSTERECTOMY  08/2010   ANTERIOR CERVICAL DECOMP/DISCECTOMY FUSION N/A 03/31/2013   Procedure: ANTERIOR CERVICAL DECOMPRESSION/DISCECTOMY FUSION 1 LEVEL Cervical five-six;  Surgeon: Reinaldo Meeker, MD;  Location: [facility];  Service: Neurosurgery;  Laterality: N/A;   APPENDECTOMY     BACK SURGERY     BREAST BIOPSY Right 11/12/2023   [place] RT BREAST BX W LOC DEV 1ST LESION IMG BX SPEC US GUIDE 11/12/2023 [facility] MAMMOGRAPHY   BREAST RECONSTRUCTION WITH PLACEMENT OF TISSUE EXPANDER AND FLEX HD (ACELLULAR HYDRATED DERMIS) Bilateral 12/02/2023   Procedure: BILATERAL BREAST RECONSTRUCTION WITH PLACEMENT OF TISSUE EXPANDER AND FLEX HD  (ACELLULAR HYDRATED DERMIS);  Surgeon: Allena Napoleon, MD;  Location: [facility];  Service: Plastics;  Laterality: Bilateral;   Cholangio-Pancreatography with Spintectorotomy + Stent  07/16/2012   01/15/14   CHOLECYSTECTOMY     ESOPHAGOGASTRODUODENOSCOPY (EGD) WITH PROPOFOL N/A 08/19/2018   Procedure: ESOPHAGOGASTRODUODENOSCOPY (EGD) WITH PROPOFOL;  Surgeon: Charlott Rakes, MD;  Location: [facility];  Service: Endoscopy;  Laterality: N/A;   LAPAROSCOPIC APPENDECTOMY N/A 02/28/2017   Procedure: APPENDECTOMY LAPAROSCOPIC;  Surgeon: Harriette Bouillon, MD;  Location: [facility];  Service: General;  Laterality: N/A;   LAPAROSCOPIC ENDOMETRIOSIS FULGURATION     LAPAROSCOPIC GASTRIC SLEEVE RESECTION N/A 07/15/2018   Procedure: LAPAROSCOPIC GASTRIC SLEEVE RESECTION, UPPER ENDO, ERAS Pathway;  Surgeon: Luretha Cragg, MD;  Location: [facility];  Service: General;  Laterality: N/A;   LUMBAR LAMINECTOMY     LUMBAR LAMINECTOMY/DECOMPRESSION MICRODISCECTOMY Left 06/22/2016   Procedure: MICRODISCECTOMY LEFT LUMBAR FOUR-FIVE;  Surgeon: Lisbeth Renshaw, MD;  Location: [facility];  Service: Neurosurgery;  Laterality: Left;   REDUCTION MAMMAPLASTY Bilateral    ROUX-EN-Y PROCEDURE  04/1999   SIMPLE MASTECTOMY WITH AXILLARY SENTINEL NODE BIOPSY Bilateral 12/02/2023   Procedure: BILATERAL MASTECTOMY WITH RIGHT AXILLARY SENTINEL NODE BIOPSY;  Surgeon: Abigail Miyamoto, MD;  Location: [facility];  Service: General;  Laterality: Bilateral;  LMA PEC BLOCK   Patient Active Problem List  Diagnosis Date Noted   SPINK1 gene mutation 12/18/2023   Monoallelic mutation of CFTR gene 12/18/2023   Status post bilateral mastectomy 12/17/2023   Breast cancer (HCC) 12/02/2023   Genetic testing 12/02/2023   Family history of breast cancer    Family history of colon cancer    Malignant neoplasm of upper-outer quadrant of right breast in female, estrogen receptor positive (HCC) 11/21/2023    Chronic abdominal pain 10/01/2023   RUQ abdominal pain 09/19/2023   Gastrojejunal (GJ) tube in place [facility]) 09/19/2023   Chronic diarrhea 09/19/2023   Diarrhea due to drug 09/19/2023   Nausea and vomiting 07/17/2023   LUQ abdominal pain 07/16/2023   Acute non-recurrent maxillary sinusitis 04/19/2023   Pre-op exam 02/27/2023   History of Cushing's syndrome 02/27/2023   Wrongful diagnosis of von Willebrand's disease 02/27/2023   Intractable nausea and vomiting 01/02/2023   Hypocalcemia 01/02/2023   Encounter to establish care with new doctor 12/16/2022   Low back pain 12/16/2022   Allergic reaction to bee sting 03/16/2022   Chronic right shoulder pain 03/16/2022   Gastroparesis 03/16/2022   History of pancreatitis 03/16/2022   History of hysterectomy 03/16/2022   Mild intermittent asthma 03/16/2022   Iron deficiency anemia 03/16/2022   Insomnia 03/16/2022   History of shingles 03/16/2022   Anxiety 04/07/2019   Palpitations 12/05/2018   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 07/15/2018   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   S/P lumbar fusion 10/09/2016   Arthropathy of lumbar facet joint 07/30/2016   HNP (herniated nucleus pulposus), lumbar 06/22/2016   Hypomagnesemia 11/25/2013   Anemia of chronic disease 11/23/2013   Endometriosis 09/01/2013   Celiac disease 09/01/2013   Asthma, chronic 09/01/2013   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   Abnormal LFTs 07/15/2012   POTS (postural orthostatic tachycardia syndrome) 11/26/2011    PCP: Fanny Bien, MD  REFERRING PROVIDER: Abigail Miyamoto, MD  REFERRING DIAG: R breast cancer  THERAPY DIAG:  Abnormal posture  Malignant neoplasm of right breast in female, estrogen receptor positive, unspecified site of breast [facility])  Rationale for Evaluation and Treatment: Rehabilitation  ONSET DATE: 11/12/23  SUBJECTIVE:                                                                                                                                                                                            SUBJECTIVE STATEMENT: I get some swelling on both sides that gets worse as the day goes on. I have been wearing the medium level of compression.   PERTINENT HISTORY:  Patient was diagnosed on 11/12/23 with right grade 2. It measures 0.7 cm. It is ER/PR+, HER2- with  a Ki67 of 10%. Hx of superior mesenteric artery syndrome with duodenum jejunostomy July 2001, ruptured cervical disc 2015, Cushing's disease, POTS, celiac, Lumbosacral disc fusion on 06/19/2022, has gastroparesis has gastrostomy tube. Bilateral mastectomy on 12/02/23- 0/1 nodes on R  PATIENT GOALS:  Reassess how my recovery is going related to arm function, pain, and swelling.  PAIN:  Are you having pain? No  PRECAUTIONS: Recent Surgery, right UE Lymphedema risk,  Other: POTS, g tube   RED FLAGS: None   ACTIVITY LEVEL / LEISURE: walking a mile daily, does post op exercises   OBJECTIVE:   PATIENT SURVEYS:  QUICK DASH:  Quick Dash - 12/25/23 0001     Open a tight or new jar Mild difficulty    Do heavy household chores (wash walls, wash floors) No difficulty    Carry a shopping bag or briefcase No difficulty    Wash your back No difficulty    Use a knife to cut food No difficulty    Recreational activities in which you take some force or impact through your arm, shoulder, or hand (golf, hammering, tennis) No difficulty    During the past week, to what extent has your arm, shoulder or hand problem interfered with your normal social activities with family, friends, neighbors, or groups? Not at all    During the past week, to what extent has your arm, shoulder or hand problem limited your work or other regular daily activities Not at all    Arm, shoulder, or hand pain. None    Tingling (pins and needles) in your arm, shoulder, or hand None    Difficulty Sleeping No difficulty    DASH Score 2.27 %              OBSERVATIONS: Healing  mastectomy scars bilaterally, post op swelling visible at bilateral trunk with R worse than L  POSTURE:  Forward head and rounded shoulders  LYMPHEDEMA ASSESSMENT:   UPPER EXTREMITY AROM/PROM:   A/PROM RIGHT   eval   RIGHT 12/25/23  Shoulder extension 81 75  Shoulder flexion 164 164  Shoulder abduction 171 170  Shoulder internal rotation 78 79  Shoulder external rotation 87 85                          (Blank rows = not tested)   A/PROM LEFT   eval LEFT 12/25/23  Shoulder extension 75 81  Shoulder flexion 167 167  Shoulder abduction 170 171  Shoulder internal rotation 71 79  Shoulder external rotation 86 85                          (Blank rows = not tested)   CERVICAL AROM: All within normal limits:      Percent limited  Flexion WFL  Extension WFL  Right lateral flexion WFL  Left lateral flexion WFL  Right rotation [facility]  Left rotation [facility]     Surgery type/Date: 12/02/23 Bilateral mastectomy and R SLNB Number of lymph nodes removed: 0/1 Current/past treatment (chemo, radiation, hormone therapy): unsure what she will need yet Other symptoms:  Heaviness/tightness No Pain No Pitting edema No Infections No Decreased scar mobility - still healing Stemmer sign No  PATIENT EDUCATION:  Education details: ABC class, posture, post op exercises, importance of walking, scar mobilization Person educated: Patient Education method: Explanation and Handouts Education comprehension: verbalized understanding  HOME EXERCISE PROGRAM: Reviewed previously given post op HEP.  ASSESSMENT:  CLINICAL IMPRESSION: Pt returns to PT after undergoing a bilateral mastectomy on 12/02/23 and R SLNB 0/1. She has returned to baseline shoulder ROM. She reports some post op edema at bilateral trunk but reports when she wears her higher level compression bra it helps. Discussed that post op edema is normal and should resolve but if it is still present in another 2-4 weeks she may want to come back  for MLD. Pt was educated on the after breast cancer class to learn about lymphedema risk reduction practices. She will be discharged from skilled PT services at this time.   Pt will benefit from skilled therapeutic intervention to improve on the following deficits: Decreased knowledge of precautions, impaired UE functional use, pain, decreased ROM, postural dysfunction.   PT treatment/interventions: ADL/Self care home management,    GOALS: Goals reviewed with patient? Yes  LONG TERM GOALS:  (STG=LTG)  GOALS Name Target Date  Goal status  1 Pt will demonstrate she has regained full shoulder ROM and function post operatively compared to baselines.  Baseline: 12/25/23 MET     PLAN:  PT FREQUENCY/DURATION: d/c this session  PLAN FOR NEXT SESSION: d/c this session - continue L dex screens every 3 months for the first 2 years post op   [facility]  [address], [address]  [place] [address]  [phone]  After Breast Cancer Class Video It is recommended you view the ABC class video to be educated on lymphedema risk reduction. This video lasts for about 30 minutes. It can be viewed on our website here: [email or web address]  Scar massage You can begin gentle scar massage to you incision sites. Gently place one hand on the incision and move the skin (without sliding on the skin) in various directions. Do this for a few minutes and then you can gently massage either coconut oil or vitamin E cream into the scars.  Compression garment You should continue wearing your compression bra until you feel like you no longer have swelling.  Home exercise Program Continue doing the exercises you were given until you feel like you can do them without feeling any tightness at the end.   Walking Program Studies show that 30 minutes of walking per day (fast enough to elevate your heart rate) can  significantly reduce the risk of a cancer recurrence. If you can't walk due to other medical reasons, we encourage you to find another activity you could do (like a stationary bike or water exercise).  Posture After breast cancer surgery, people frequently sit with rounded shoulders posture because it puts their incisions on slack and feels better. If you sit like this and scar tissue forms in that position, you can become very tight and have pain sitting or standing with good posture. Try to be aware of your posture and sit and stand up tall to heal properly.  Follow up PT: It is recommended you return every 3 months for the first 3 years following surgery to be assessed on the SOZO machine for an L-Dex score. This helps prevent clinically significant lymphedema in 95% of patients. These follow up screens are 10 minute appointments that you are not billed for.   PHYSICAL THERAPY DISCHARGE SUMMARY  Visits from Start of Care: 2  Current functional level related to goals / functional outcomes: All goals met   Remaining deficits: None   Education / Equipment: HEP, lymphedema risk reduction, ABC class, scar mobilization, walking program, posture   Patient agrees to  discharge. Patient goals were met. Patient is being discharged due to meeting the stated rehab goals.  [facility] [place], PT 12/25/2023, 9:36 AM

## 2023-12-26 DIAGNOSIS — K3184 Gastroparesis: Secondary | ICD-10-CM | POA: Diagnosis not present

## 2023-12-27 ENCOUNTER — Telehealth: Payer: Self-pay

## 2023-12-27 DIAGNOSIS — K3184 Gastroparesis: Secondary | ICD-10-CM | POA: Diagnosis not present

## 2023-12-27 NOTE — Telephone Encounter (Signed)
 Spoke with patient and confirmed visit on 12/30/23

## 2023-12-28 DIAGNOSIS — K3184 Gastroparesis: Secondary | ICD-10-CM | POA: Diagnosis not present

## 2023-12-29 DIAGNOSIS — K3184 Gastroparesis: Secondary | ICD-10-CM | POA: Diagnosis not present

## 2023-12-30 ENCOUNTER — Inpatient Hospital Stay: Payer: BC Managed Care – PPO | Attending: Hematology and Oncology | Admitting: Hematology and Oncology

## 2023-12-30 DIAGNOSIS — Z79899 Other long term (current) drug therapy: Secondary | ICD-10-CM | POA: Insufficient documentation

## 2023-12-30 DIAGNOSIS — Z1721 Progesterone receptor positive status: Secondary | ICD-10-CM | POA: Insufficient documentation

## 2023-12-30 DIAGNOSIS — Z1732 Human epidermal growth factor receptor 2 negative status: Secondary | ICD-10-CM | POA: Insufficient documentation

## 2023-12-30 DIAGNOSIS — Z9013 Acquired absence of bilateral breasts and nipples: Secondary | ICD-10-CM | POA: Insufficient documentation

## 2023-12-30 DIAGNOSIS — K3184 Gastroparesis: Secondary | ICD-10-CM | POA: Insufficient documentation

## 2023-12-30 DIAGNOSIS — C50411 Malignant neoplasm of upper-outer quadrant of right female breast: Secondary | ICD-10-CM | POA: Insufficient documentation

## 2023-12-30 DIAGNOSIS — Z170 Estrogen receptor positive status [ER+]: Secondary | ICD-10-CM | POA: Insufficient documentation

## 2023-12-30 MED ORDER — TAMOXIFEN CITRATE 20 MG PO TABS: 20.0000 mg | ORAL_TABLET | Freq: Every day | ORAL | 3 refills | Status: AC

## 2023-12-30 NOTE — Assessment & Plan Note (Addendum)
 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  She had bilateral mastectomy with clear margins and no lymph node involvement. Tumor slightly larger than estimated (12mm vs 9mm). Oncotype DX score of 14 indicates low risk of recurrence and no benefit from chemotherapy. -Start Tamoxifen 20mg  daily for a minimum of 5 years. -Discussed mechanism of action of tamoxifen, adverse effects including but not limited to post menopausal symptoms, increased risk of DVT/PE, no concern for endometrial complications( she had hysterectomy) and benefit on bone denisty -Check for tolerance and side effects in 3 months with SCP.  Gastroparesis   History of gastroparesis, currently managed with feeding tube. No indication for chemotherapy.  Family History   No family history of breast cancer. Maternal grandmother had colorectal cancer in her 67s, maternal uncle had thyroid cancer in his 1s.   -Genetic testing ordered to assess for hereditary cancer risk given young age at diagnosis.  Postural Orthostatic Tachycardia Syndrome (POTS)   Managed with Sectral (acebutolol) and Corlanor (ivabradine).   -Continue current management.    History of Hysterectomy   Due to endometriosis, still has her left ovary.

## 2023-12-30 NOTE — Progress Notes (Signed)
 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  She had bilateral mastectomy with clear margins and no lymph node involvement. Tumor slightly larger than estimated (12mm vs 9mm). Oncotype DX score of 14 indicates low risk of recurrence and no benefit from chemotherapy. -Start Tamoxifen 20mg  daily for a minimum of 5 years. -Discussed mechanism of action of tamoxifen, adverse effects including but not limited to post menopausal symptoms, increased risk of DVT/PE, no concern for endometrial complications( she had hysterectomy) and benefit on bone denisty -Check for tolerance and side effects in 3 months with SCP.  Gastroparesis   History of gastroparesis, currently managed with feeding tube. No indication for chemotherapy.  Family History   No family history of breast cancer. Maternal grandmother had colorectal cancer in her 28s, maternal uncle had thyroid cancer in his 34s.   -Genetic testing ordered to assess for hereditary cancer risk given young age at diagnosis.  Postural Orthostatic Tachycardia Syndrome (POTS)   Managed with Sectral (acebutolol) and Corlanor (ivabradine).   -Continue current management.    History of Hysterectomy   Due to endometriosis, still has her left ovary.    No orders of the defined types were placed in this encounter.    HISTORY OF PRESENTING ILLNESS:  Kaitlyn Johnson 43 y.o.  female is here because of new onset breast cancer  Discussed the use of AI scribe software for clinical note transcription with the patient, who gave verbal consent to proceed.  History of Present Illness    The patient, a 43 year old with a history of Postural Orthostatic Tachycardia Syndrome (POTS), gastroparesis, and superior mesenteric artery syndrome with newly diagnosed right breast cancer who is here for a follow up. Discussed the use of AI scribe software for clinical note transcription with the patient, who gave verbal consent to proceed.  History of Present Illness    Kaitlyn Johnson is a 43 year old female with invasive ductal carcinoma who presents for post-surgical follow-up.  She underwent surgery for invasive ductal carcinoma, where a tumor measuring 12 millimeters was removed. The surgery included the removal of one sentinel lymph node, which was clear, and the other breast was normal with no tumor present.  Her Oncotype DX score was 14, indicating a low risk of recurrence.   She has had a partial hysterectomy, retaining her left ovary, and is premenopausal, which affects the choice of adjuvant therapy, limiting it to tamoxifen rather than aromatase inhibitors.  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  Rest of the pertinent 10 point ROS reviewed and neg.  MEDICAL HISTORY:  Past Medical History:  Diagnosis Date   Acute appendicitis 02/28/2017   Acute pancreatitis 11/30/2013   Anemia 11/26/2011   Anxiety    Asthma    Celiac and mesenteric artery injury    Cushing's syndrome (HCC)    06/21/16- "in remission"   Depression    Family history of breast cancer    Family history of  colon cancer    H/O hiatal hernia    History of kidney stones    Iatrogenic Cushing's syndrome (HCC) 10/02/2013   Nausea vomiting and diarrhea 11/23/2013   Nausea with vomiting 11/30/2013   Palpitations 12/05/2018   Pancreatitis 11/29/2013   POTS  (postural orthostatic tachycardia syndrome)    Shortness of breath dyspnea    with exertertion   Sphincter of Oddi dysfunction     SURGICAL HISTORY: Past Surgical History:  Procedure Laterality Date   ABDOMINAL HYSTERECTOMY  08/2010   ANTERIOR CERVICAL DECOMP/DISCECTOMY FUSION N/A 03/31/2013   Procedure: ANTERIOR CERVICAL DECOMPRESSION/DISCECTOMY FUSION 1 LEVEL Cervical five-six;  Surgeon: Reinaldo Meeker, MD;  Location: [facility];  Service: Neurosurgery;  Laterality: N/A;   APPENDECTOMY     BACK SURGERY     BREAST BIOPSY Right 11/12/2023   [place] RT BREAST BX W LOC DEV 1ST LESION IMG BX SPEC US GUIDE 11/12/2023 [facility]   BREAST RECONSTRUCTION WITH PLACEMENT OF TISSUE EXPANDER AND FLEX HD (ACELLULAR HYDRATED DERMIS) Bilateral 12/02/2023   Procedure: BILATERAL BREAST RECONSTRUCTION WITH PLACEMENT OF TISSUE EXPANDER AND FLEX HD (ACELLULAR HYDRATED DERMIS);  Surgeon: Allena Napoleon, MD;  Location: [facility];  Service: Plastics;  Laterality: Bilateral;   Ch[place]angio-Pancreatography with Spintectorotomy + Stent  07/16/2012   01/15/14   CHOLECYSTECTOMY     ESOPHAGOGASTRODUODENOSCOPY (EGD) WITH PROPOFOL N/A 08/19/2018   Procedure: ESOPHAGOGASTRODUODENOSCOPY (EGD) WITH PROPOFOL;  Surgeon: Charlott Rakes, MD;  Location: [facility];  Service: Endoscopy;  Laterality: N/A;   LAPAROSCOPIC APPENDECTOMY N/A 02/28/2017   Procedure: APPENDECTOMY LAPAROSCOPIC;  Surgeon: Harriette Bouillon, MD;  Location: [facility];  Service: General;  Laterality: N/A;   LAPAROSCOPIC ENDOMETRIOSIS FULGURATION     LAPAROSCOPIC GASTRIC SLEEVE RESECTION N/A 07/15/2018   Procedure: LAPAROSCOPIC GASTRIC SLEEVE RESECTION, UPPER ENDO, ERAS Pathway;  Surgeon: Luretha Aquilino, MD;  Location: [facility];  Service: General;  Laterality: N/A;   LUMBAR LAMINECTOMY     LUMBAR LAMINECTOMY/DECOMPRESSION MICRODISCECTOMY Left 06/22/2016   Procedure: MICRODISCECTOMY LEFT LUMBAR FOUR-FIVE;  Surgeon: Lisbeth Renshaw, MD;   Location: [facility];  Service: Neurosurgery;  Laterality: Left;   REDUCTION MAMMAPLASTY Bilateral    ROUX-EN-Y PROCEDURE  04/1999   SIMPLE MASTECTOMY WITH AXILLARY SENTINEL NODE BIOPSY Bilateral 12/02/2023   Procedure: BILATERAL MASTECTOMY WITH RIGHT AXILLARY SENTINEL NODE BIOPSY;  Surgeon: Abigail Miyamoto, MD;  Location: [facility];  Service: General;  Laterality: Bilateral;  LMA PEC BLOCK    SOCIAL HISTORY: Social History   Socioeconomic History   Marital status: Married    Spouse name: Not on file   Number of children: 2   Years of education: Not on file   Highest education level: Associate degree: academic program  Occupational History   Not on file  Tobacco Use   Smoking status: Never    Passive exposure: Never   Smokeless tobacco: Never  Vaping Use   Vaping status: Never Used  Substance and Sexual Activity   Alcohol use: Yes    Alcohol/week: 5.0 standard drinks of alcohol    Types: 5 Glasses of wine per week    Comment: OCC   Drug use: No   Sexual activity: Yes    Partners: Male    Birth control/protection: Surgical  Other Topics Concern   Not on file  Social History Narrative   Regular exercise: can't at this time   Caffeine use: no   Social Drivers of Corporate investment banker Strain: Low Risk  (10/01/2023)  Overall Financial Resource Strain (CARDIA)    Difficulty of Paying Living Expenses: Not hard at all  Food Insecurity: No Food Insecurity (10/01/2023)   Hunger Vital Sign    Worried About Running Out of Food in the Last Year: Never true    Ran Out of Food in the Last Year: Never true  Transportation Needs: No Transportation Needs (10/01/2023)   PRAPARE - Administrator, Civil Service (Medical): No    Lack of Transportation (Non-Medical): No  Physical Activity: Unknown (10/01/2023)   Exercise Vital Sign    Days of Exercise per Week: 0 days    Minutes of Exercise per Session: Not on file  Stress: No Stress Concern Present  (10/01/2023)   [employer] of Occupational Health - Occupational Stress Questionnaire    Feeling of Stress : Not at all  Social Connections: Moderately Integrated (10/01/2023)   Social Connection and Isolation Panel [NHANES]    Frequency of Communication with Friends and Family: More than three times a week    Frequency of Social Gatherings with Friends and Family: Twice a week    Attends Religious Services: More than 4 times per year    Active Member of [employer] or Organizations: No    Attends Engineer, structural: Not on file    Marital Status: Married  Catering manager Violence: Not At Risk (07/17/2023)   Humiliation, Afraid, Rape, and Kick questionnaire    Fear of Current or Ex-Partner: No    Emotionally Abused: No    Physically Abused: No    Sexually Abused: No    FAMILY HISTORY: Family History  Problem Relation Age of Onset   Hypertension Mother    Thyroid cancer Maternal Uncle        dx 68s   Stroke Paternal Aunt    Colon cancer Maternal Grandmother        dx 74s   Heart attack Maternal Grandfather    Aortic aneurysm Paternal Grandfather    Breast cancer Other        MGMs sister, d. 30s    ALLERGIES:  is allergic to bee venom; reglan [metoclopramide]; shellfish allergy; adhesive [tape]; measles, mumps & rubella vac; sulfa antibiotics; and yellow dye.  MEDICATIONS:  Current Outpatient Medications  Medication Sig Dispense Refill   tamoxifen (NOLVADEX) 20 MG tablet Take 1 tablet (20 mg total) by mouth daily. 90 tablet 3   ABILIFY MAINTENA 400 MG SRER injection Inject 400 mg into the muscle every 28 (twenty-eight) days.     acebutolol (SECTRAL) 200 MG capsule TAKE ONE CAPSULE BY MOUTH TWICE DAILY 180 capsule 3   acetaminophen (TYLENOL) 500 MG tablet Take 2 tablets (1,000 mg total) by mouth every 8 (eight) hours. 30 tablet 0   albuterol (VENTOLIN HFA) 108 (90 Base) MCG/ACT inhaler Inhale 1 puff into the lungs as needed for wheezing or shortness of breath.      AMBIEN CR 12.5 MG CR tablet Take 12.5 mg by mouth at bedtime.  1   ARIPiprazole ER (ABILIFY MAINTENA) 400 MG PRSY prefilled syringe Inject 400 mg into the muscle every 28 (twenty-eight) days. 3 each 1   busPIRone (BUSPAR) 10 MG tablet Take 20 mg by mouth 3 (three) times daily.     clonazePAM (KLONOPIN) 0.5 MG tablet Take 0.5 mg by mouth 2 (two) times daily as needed for anxiety.     cyanocobalamin (VITAMIN B12) 1000 MCG/ML injection Inject 1,000 mcg into the skin every 30 (thirty) days.  desipramine (NORPRAMIN) 25 MG tablet Take 25 mg by mouth at bedtime.     DEXILANT 30 MG capsule Take 30 mg by mouth 2 (two) times daily.     dicyclomine (BENTYL) 10 MG capsule Take 1 capsule (10 mg total) by mouth 3 (three) times daily as needed for spasms (Pain/ spasms).     diphenoxylate-atropine (LOMOTIL) 2.5-0.025 MG tablet Take 1 tablet by mouth 4 (four) times daily as needed for diarrhea or loose stools.     EPINEPHrine 0.3 mg/0.3 mL IJ SOAJ injection Inject 0.3 mg into the muscle as needed for anaphylaxis. 1 each 3   ipratropium (ATROVENT) 0.03 % nasal spray Place 2 sprays into both nostrils every 12 (twelve) hours. 30 mL 12   ivabradine (CORLANOR) 7.5 MG TABS tablet Take 1 tablet (7.5 mg total) by mouth 2 (two) times daily with a meal. 180 tablet 3   lamoTRIgine (LAMICTAL) 100 MG tablet Take 100 mg by mouth at bedtime.     levalbuterol (XOPENEX HFA) 45 MCG/ACT inhaler Inhale 1 puff into the lungs as needed for wheezing or shortness of breath.     levalbuterol (XOPENEX) 0.63 MG/3ML nebulizer solution Take 0.63 mg by nebulization every 6 (six) hours as needed for wheezing or shortness of breath.     montelukast (SINGULAIR) 10 MG tablet Take 1 tablet (10 mg total) by mouth at bedtime. 30 tablet 3   ondansetron (ZOFRAN-ODT) 4 MG disintegrating tablet Take 1 tablet (4 mg total) by mouth every 8 (eight) hours as needed for refractory nausea / vomiting, nausea or vomiting. 30 tablet 0   traMADol (ULTRAM) 50 MG  tablet Take 2 tablets (100 mg total) by mouth every 8 (eight) hours as needed for moderate pain (pain score 4-6) or severe pain (pain score 7-10). 180 tablet 5   traZODone (DESYREL) 100 MG tablet Take 100-300 mg by mouth at bedtime as needed for sleep.     No current facility-administered medications for this visit.     PHYSICAL EXAMINATION: ECOG PERFORMANCE STATUS: 0 - Asymptomatic  Vitals:   12/30/23 0911  BP: 120/66  Pulse: 67  Resp: 16  Temp: 97.9 F (36.6 C)  SpO2: 99%    Filed Weights   12/30/23 0911  Weight: 210 lb 12.8 oz (95.6 kg)     GENERAL:alert, no distress and comfortable SKIN: skin color, texture, turgor are normal, no rashes or significant lesions EYES: normal, conjunctiva are pink and non-injected, sclera clear OROPHARYNX:no exudate, no erythema and lips, buccal mucosa, and tongue normal  NECK: supple, thyroid normal size, non-tender, without nodularity LYMPH:  no palpable lymphadenopathy in the cervical, axillary LUNGS: clear to auscultation and percussion with normal breathing effort HEART: regular rate & rhythm and no murmurs and no lower extremity edema ABDOMEN:abdomen soft, non-tender and normal bowel sounds Musculoskeletal:no cyanosis of digits and no clubbing  PSYCH: alert & oriented x 3 with fluent speech NEURO: no focal motor/sensory deficits  LABORATORY DATA:  I have reviewed the data as listed Lab Results  Component Value Date   WBC 7.5 11/27/2023   HGB 11.9 (L) 11/27/2023   HCT 36.3 11/27/2023   MCV 90.5 11/27/2023   PLT 255 11/27/2023     Chemistry      Component Value Date/Time   NA 138 11/27/2023 1121   NA 140 07/28/2019 1520   NA 138 09/07/2013 1409   K 4.1 11/27/2023 1121   K 3.7 09/07/2013 1409   CL 105 11/27/2023 1121   CL 102 11/11/2012 1324  CO2 28 11/27/2023 1121   CO2 23 09/07/2013 1409   BUN 9 11/27/2023 1121   BUN 5 (L) 07/28/2019 1520   BUN 12.4 09/07/2013 1409   CREATININE 0.73 11/27/2023 1121   CREATININE  0.81 07/06/2022 1539   CREATININE 0.8 09/07/2013 1409      Component Value Date/Time   CALCIUM 9.0 11/27/2023 1121   CALCIUM 9.4 09/07/2013 1409   ALKPHOS 64 11/27/2023 1121   ALKPHOS 81 09/07/2013 1409   AST 18 11/27/2023 1121   AST 14 09/07/2013 1409   ALT 16 11/27/2023 1121   ALT 28 09/07/2013 1409   BILITOT 0.3 11/27/2023 1121   BILITOT <0.20 09/07/2013 1409       RADIOGRAPHIC STUDIES: I have personally reviewed the radiological images as listed and agreed with the findings in the report. No results found.   All questions were answered. The patient knows to call the clinic with any problems, questions or concerns.     Rachel Moulds, MD 12/30/2023 9:50 AM

## 2023-12-31 DIAGNOSIS — K3184 Gastroparesis: Secondary | ICD-10-CM | POA: Diagnosis not present

## 2024-01-01 DIAGNOSIS — K3184 Gastroparesis: Secondary | ICD-10-CM | POA: Diagnosis not present

## 2024-01-02 DIAGNOSIS — F331 Major depressive disorder, recurrent, moderate: Secondary | ICD-10-CM | POA: Diagnosis not present

## 2024-01-02 DIAGNOSIS — F605 Obsessive-compulsive personality disorder: Secondary | ICD-10-CM | POA: Diagnosis not present

## 2024-01-02 DIAGNOSIS — K3184 Gastroparesis: Secondary | ICD-10-CM | POA: Diagnosis not present

## 2024-01-02 DIAGNOSIS — F411 Generalized anxiety disorder: Secondary | ICD-10-CM | POA: Diagnosis not present

## 2024-01-03 DIAGNOSIS — K3184 Gastroparesis: Secondary | ICD-10-CM | POA: Diagnosis not present

## 2024-01-04 ENCOUNTER — Encounter: Payer: Self-pay | Admitting: Hematology and Oncology

## 2024-01-04 DIAGNOSIS — K3184 Gastroparesis: Secondary | ICD-10-CM | POA: Diagnosis not present

## 2024-01-05 DIAGNOSIS — K3184 Gastroparesis: Secondary | ICD-10-CM | POA: Diagnosis not present

## 2024-01-06 DIAGNOSIS — K3184 Gastroparesis: Secondary | ICD-10-CM | POA: Diagnosis not present

## 2024-01-07 DIAGNOSIS — K859 Acute pancreatitis without necrosis or infection, unspecified: Secondary | ICD-10-CM | POA: Diagnosis not present

## 2024-01-07 DIAGNOSIS — K3184 Gastroparesis: Secondary | ICD-10-CM | POA: Diagnosis not present

## 2024-01-08 DIAGNOSIS — K3184 Gastroparesis: Secondary | ICD-10-CM | POA: Diagnosis not present

## 2024-01-09 DIAGNOSIS — K3184 Gastroparesis: Secondary | ICD-10-CM | POA: Diagnosis not present

## 2024-01-10 DIAGNOSIS — K3184 Gastroparesis: Secondary | ICD-10-CM | POA: Diagnosis not present

## 2024-01-11 DIAGNOSIS — K3184 Gastroparesis: Secondary | ICD-10-CM | POA: Diagnosis not present

## 2024-01-12 DIAGNOSIS — K3184 Gastroparesis: Secondary | ICD-10-CM | POA: Diagnosis not present

## 2024-01-13 DIAGNOSIS — Z4659 Encounter for fitting and adjustment of other gastrointestinal appliance and device: Secondary | ICD-10-CM | POA: Diagnosis not present

## 2024-01-13 DIAGNOSIS — K3184 Gastroparesis: Secondary | ICD-10-CM | POA: Diagnosis not present

## 2024-01-13 DIAGNOSIS — K861 Other chronic pancreatitis: Secondary | ICD-10-CM | POA: Diagnosis not present

## 2024-01-14 DIAGNOSIS — K3184 Gastroparesis: Secondary | ICD-10-CM | POA: Diagnosis not present

## 2024-01-15 DIAGNOSIS — K3184 Gastroparesis: Secondary | ICD-10-CM | POA: Diagnosis not present

## 2024-01-16 DIAGNOSIS — K3184 Gastroparesis: Secondary | ICD-10-CM | POA: Diagnosis not present

## 2024-01-17 DIAGNOSIS — K3184 Gastroparesis: Secondary | ICD-10-CM | POA: Diagnosis not present

## 2024-01-20 DIAGNOSIS — K3184 Gastroparesis: Secondary | ICD-10-CM | POA: Diagnosis not present

## 2024-01-20 DIAGNOSIS — R1013 Epigastric pain: Secondary | ICD-10-CM | POA: Diagnosis not present

## 2024-01-21 DIAGNOSIS — K3184 Gastroparesis: Secondary | ICD-10-CM | POA: Diagnosis not present

## 2024-01-22 DIAGNOSIS — K3184 Gastroparesis: Secondary | ICD-10-CM | POA: Diagnosis not present

## 2024-01-23 DIAGNOSIS — K3184 Gastroparesis: Secondary | ICD-10-CM | POA: Diagnosis not present

## 2024-01-24 DIAGNOSIS — K3184 Gastroparesis: Secondary | ICD-10-CM | POA: Diagnosis not present

## 2024-01-26 DIAGNOSIS — K3184 Gastroparesis: Secondary | ICD-10-CM | POA: Diagnosis not present

## 2024-01-27 DIAGNOSIS — K3184 Gastroparesis: Secondary | ICD-10-CM | POA: Diagnosis not present

## 2024-01-28 DIAGNOSIS — K3184 Gastroparesis: Secondary | ICD-10-CM | POA: Diagnosis not present

## 2024-01-29 DIAGNOSIS — K859 Acute pancreatitis without necrosis or infection, unspecified: Secondary | ICD-10-CM | POA: Diagnosis not present

## 2024-01-29 DIAGNOSIS — T183XXA Foreign body in small intestine, initial encounter: Secondary | ICD-10-CM | POA: Diagnosis not present

## 2024-01-29 DIAGNOSIS — Z79899 Other long term (current) drug therapy: Secondary | ICD-10-CM | POA: Diagnosis not present

## 2024-01-29 DIAGNOSIS — Z931 Gastrostomy status: Secondary | ICD-10-CM | POA: Diagnosis not present

## 2024-01-29 DIAGNOSIS — K3184 Gastroparesis: Secondary | ICD-10-CM | POA: Diagnosis not present

## 2024-01-29 DIAGNOSIS — Z934 Other artificial openings of gastrointestinal tract status: Secondary | ICD-10-CM | POA: Diagnosis not present

## 2024-01-29 DIAGNOSIS — Z1289 Encounter for screening for malignant neoplasm of other sites: Secondary | ICD-10-CM | POA: Diagnosis not present

## 2024-01-29 DIAGNOSIS — Q453 Other congenital malformations of pancreas and pancreatic duct: Secondary | ICD-10-CM | POA: Diagnosis not present

## 2024-01-29 DIAGNOSIS — J45909 Unspecified asthma, uncomplicated: Secondary | ICD-10-CM | POA: Diagnosis not present

## 2024-01-29 DIAGNOSIS — Z9884 Bariatric surgery status: Secondary | ICD-10-CM | POA: Diagnosis not present

## 2024-01-29 DIAGNOSIS — D6804 Acquired von Willebrand disease: Secondary | ICD-10-CM | POA: Diagnosis not present

## 2024-01-30 DIAGNOSIS — K3184 Gastroparesis: Secondary | ICD-10-CM | POA: Diagnosis not present

## 2024-02-01 DIAGNOSIS — K3184 Gastroparesis: Secondary | ICD-10-CM | POA: Diagnosis not present

## 2024-02-02 DIAGNOSIS — K3184 Gastroparesis: Secondary | ICD-10-CM | POA: Diagnosis not present

## 2024-02-03 DIAGNOSIS — R1032 Left lower quadrant pain: Secondary | ICD-10-CM | POA: Diagnosis not present

## 2024-02-03 DIAGNOSIS — K3184 Gastroparesis: Secondary | ICD-10-CM | POA: Diagnosis not present

## 2024-02-04 ENCOUNTER — Encounter: Payer: Self-pay | Admitting: Cardiology

## 2024-02-04 ENCOUNTER — Other Ambulatory Visit: Payer: Self-pay | Admitting: Family Medicine

## 2024-02-04 DIAGNOSIS — K3184 Gastroparesis: Secondary | ICD-10-CM | POA: Diagnosis not present

## 2024-02-04 MED ORDER — IVABRADINE HCL 7.5 MG PO TABS: 7.5000 mg | ORAL_TABLET | Freq: Two times a day (BID) | ORAL | 3 refills | Status: AC

## 2024-02-06 DIAGNOSIS — K3184 Gastroparesis: Secondary | ICD-10-CM | POA: Diagnosis not present

## 2024-02-07 ENCOUNTER — Encounter (HOSPITAL_COMMUNITY): Payer: Self-pay | Admitting: *Deleted

## 2024-02-07 DIAGNOSIS — K3184 Gastroparesis: Secondary | ICD-10-CM | POA: Diagnosis not present

## 2024-02-10 DIAGNOSIS — K3184 Gastroparesis: Secondary | ICD-10-CM | POA: Diagnosis not present

## 2024-02-11 ENCOUNTER — Other Ambulatory Visit (HOSPITAL_COMMUNITY): Payer: Self-pay

## 2024-02-11 DIAGNOSIS — K3184 Gastroparesis: Secondary | ICD-10-CM | POA: Diagnosis not present

## 2024-02-12 DIAGNOSIS — K3184 Gastroparesis: Secondary | ICD-10-CM | POA: Diagnosis not present

## 2024-02-20 DIAGNOSIS — F331 Major depressive disorder, recurrent, moderate: Secondary | ICD-10-CM | POA: Diagnosis not present

## 2024-02-20 DIAGNOSIS — F605 Obsessive-compulsive personality disorder: Secondary | ICD-10-CM | POA: Diagnosis not present

## 2024-02-20 DIAGNOSIS — F411 Generalized anxiety disorder: Secondary | ICD-10-CM | POA: Diagnosis not present

## 2024-02-27 DIAGNOSIS — K3184 Gastroparesis: Secondary | ICD-10-CM | POA: Diagnosis not present

## 2024-03-03 ENCOUNTER — Other Ambulatory Visit: Payer: Self-pay | Admitting: Family Medicine

## 2024-03-09 ENCOUNTER — Ambulatory Visit: Payer: BC Managed Care – PPO

## 2024-03-11 DIAGNOSIS — Z903 Acquired absence of stomach [part of]: Secondary | ICD-10-CM | POA: Diagnosis not present

## 2024-03-11 DIAGNOSIS — R1110 Vomiting, unspecified: Secondary | ICD-10-CM | POA: Diagnosis not present

## 2024-03-11 DIAGNOSIS — K3184 Gastroparesis: Secondary | ICD-10-CM | POA: Diagnosis not present

## 2024-03-13 DIAGNOSIS — K9423 Gastrostomy malfunction: Secondary | ICD-10-CM | POA: Diagnosis not present

## 2024-03-13 DIAGNOSIS — Z431 Encounter for attention to gastrostomy: Secondary | ICD-10-CM | POA: Diagnosis not present

## 2024-03-13 DIAGNOSIS — G90A Postural orthostatic tachycardia syndrome (POTS): Secondary | ICD-10-CM | POA: Diagnosis not present

## 2024-03-19 DIAGNOSIS — K2289 Other specified disease of esophagus: Secondary | ICD-10-CM | POA: Diagnosis not present

## 2024-03-19 DIAGNOSIS — K3184 Gastroparesis: Secondary | ICD-10-CM | POA: Diagnosis not present

## 2024-03-19 DIAGNOSIS — Z9884 Bariatric surgery status: Secondary | ICD-10-CM | POA: Diagnosis not present

## 2024-03-19 DIAGNOSIS — K2270 Barrett's esophagus without dysplasia: Secondary | ICD-10-CM | POA: Diagnosis not present

## 2024-03-19 DIAGNOSIS — N200 Calculus of kidney: Secondary | ICD-10-CM | POA: Diagnosis not present

## 2024-03-19 DIAGNOSIS — J45909 Unspecified asthma, uncomplicated: Secondary | ICD-10-CM | POA: Diagnosis not present

## 2024-03-19 DIAGNOSIS — R1110 Vomiting, unspecified: Secondary | ICD-10-CM | POA: Diagnosis not present

## 2024-03-28 ENCOUNTER — Encounter: Payer: Self-pay | Admitting: Family Medicine

## 2024-03-30 DIAGNOSIS — H1820 Unspecified corneal edema: Secondary | ICD-10-CM | POA: Diagnosis not present

## 2024-03-31 DIAGNOSIS — H1820 Unspecified corneal edema: Secondary | ICD-10-CM | POA: Diagnosis not present

## 2024-04-01 ENCOUNTER — Encounter: Payer: Self-pay | Admitting: Internal Medicine

## 2024-04-01 ENCOUNTER — Inpatient Hospital Stay: Attending: Hematology and Oncology | Admitting: Adult Health

## 2024-04-01 ENCOUNTER — Other Ambulatory Visit: Payer: Self-pay

## 2024-04-01 ENCOUNTER — Inpatient Hospital Stay: Attending: Hematology and Oncology

## 2024-04-01 VITALS — BP 135/74 | HR 69 | Temp 98.4°F | Resp 16

## 2024-04-01 DIAGNOSIS — C50411 Malignant neoplasm of upper-outer quadrant of right female breast: Secondary | ICD-10-CM

## 2024-04-01 DIAGNOSIS — Z01812 Encounter for preprocedural laboratory examination: Secondary | ICD-10-CM

## 2024-04-01 DIAGNOSIS — Z9013 Acquired absence of bilateral breasts and nipples: Secondary | ICD-10-CM | POA: Insufficient documentation

## 2024-04-01 DIAGNOSIS — R1110 Vomiting, unspecified: Secondary | ICD-10-CM

## 2024-04-01 DIAGNOSIS — Z79810 Long term (current) use of selective estrogen receptor modulators (SERMs): Secondary | ICD-10-CM | POA: Insufficient documentation

## 2024-04-01 DIAGNOSIS — Z1589 Genetic susceptibility to other disease: Secondary | ICD-10-CM

## 2024-04-01 DIAGNOSIS — Z170 Estrogen receptor positive status [ER+]: Secondary | ICD-10-CM

## 2024-04-01 DIAGNOSIS — Z1721 Progesterone receptor positive status: Secondary | ICD-10-CM | POA: Diagnosis not present

## 2024-04-01 DIAGNOSIS — H20011 Primary iridocyclitis, right eye: Secondary | ICD-10-CM | POA: Diagnosis not present

## 2024-04-01 DIAGNOSIS — Z1732 Human epidermal growth factor receptor 2 negative status: Secondary | ICD-10-CM | POA: Insufficient documentation

## 2024-04-01 LAB — PROTIME-INR
INR: 0.9 (ref 0.8–1.2)
Prothrombin Time: 12.7 s (ref 11.4–15.2)

## 2024-04-01 LAB — CBC WITH DIFFERENTIAL (CANCER CENTER ONLY)
Abs Immature Granulocytes: 0 10*3/uL (ref 0.00–0.07)
Basophils Absolute: 0 10*3/uL (ref 0.0–0.1)
Basophils Relative: 0 %
Eosinophils Absolute: 0.1 10*3/uL (ref 0.0–0.5)
Eosinophils Relative: 1 %
HCT: 34.7 % — ABNORMAL LOW (ref 36.0–46.0)
Hemoglobin: 11.4 g/dL — ABNORMAL LOW (ref 12.0–15.0)
Immature Granulocytes: 0 %
Lymphocytes Relative: 31 %
Lymphs Abs: 1.5 10*3/uL (ref 0.7–4.0)
MCH: 29.7 pg (ref 26.0–34.0)
MCHC: 32.9 g/dL (ref 30.0–36.0)
MCV: 90.4 fL (ref 80.0–100.0)
Monocytes Absolute: 0.3 10*3/uL (ref 0.1–1.0)
Monocytes Relative: 6 %
Neutro Abs: 3 10*3/uL (ref 1.7–7.7)
Neutrophils Relative %: 62 %
Platelet Count: 233 10*3/uL (ref 150–400)
RBC: 3.84 MIL/uL — ABNORMAL LOW (ref 3.87–5.11)
RDW: 13.2 % (ref 11.5–15.5)
WBC Count: 4.9 10*3/uL (ref 4.0–10.5)
nRBC: 0 % (ref 0.0–0.2)

## 2024-04-01 LAB — HEMOGLOBIN A1C
Hgb A1c MFr Bld: 5 % (ref 4.8–5.6)
Mean Plasma Glucose: 96.8 mg/dL

## 2024-04-01 LAB — CMP (CANCER CENTER ONLY)
ALT: 14 U/L (ref 0–44)
AST: 17 U/L (ref 15–41)
Albumin: 4.2 g/dL (ref 3.5–5.0)
Alkaline Phosphatase: 52 U/L (ref 38–126)
Anion gap: 6 (ref 5–15)
BUN: 5 mg/dL — ABNORMAL LOW (ref 6–20)
CO2: 28 mmol/L (ref 22–32)
Calcium: 8.6 mg/dL — ABNORMAL LOW (ref 8.9–10.3)
Chloride: 107 mmol/L (ref 98–111)
Creatinine: 0.79 mg/dL (ref 0.44–1.00)
GFR, Estimated: >60 mL/min (ref >60)
Glucose, Bld: 99 mg/dL (ref 70–99)
Potassium: 3.8 mmol/L (ref 3.5–5.1)
Sodium: 141 mmol/L (ref 135–145)
Total Bilirubin: 0.3 mg/dL (ref 0.0–1.2)
Total Protein: 7.4 g/dL (ref 6.5–8.1)

## 2024-04-01 LAB — APTT: aPTT: 26 s (ref 24–36)

## 2024-04-01 NOTE — Progress Notes (Unsigned)
 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  Negative genetic testing on the BRCAPlus panel. The final report date is December 18, 2023.  The CancerNext-Expanded gene panel offered by [facility] and includes sequencing, rearrangement, and RNA analysis for the following 81 genes: AIP, ALK, APC, ATM, BAP1, BARD1, BMPR1A, BRCA1, BRCA2, BRIP1, CDC73, CDH1, CDK4, CDKN1B, CDKN2A, CEBPA, CFTR, CHEK2, DICER1, ETV6, FH, FLCN, GATA2, LZTR1, MAX, MEN1, MET, MLH1, MSH2, MSH6, MUTYH, NF1, NF2, NTHL1, PALB2, PHOX2B, PMS2, POT1, PRKAR1A, PTCH1, PTEN, RAD51C, RAD51D, RB1, RET, RUNX1, SDHA, SDHAF2, SDHB, SDHC, SDHD, SMAD4, SMARCA4, SMARCB1, SMARCE1, STK11, SUFU, TMEM127, TP53, TSC1, TSC2, VHL and WT1 (sequencing and deletion/duplication); AXIN2, CPA1, CTNNA1, CTRC, DDX41, EGFR, HOXB13, KIT, MBD4, MITF, MSH3, PDGFRA, POLD1, POLE, PRSS1 and SPINK1 (sequencing only); EPCAM and GREM1 (deletion/duplication only). RNA data is routinely analyzed for use in variant interpretation for all genes.  The BRCAPlus gene panel offered by [facility] and includes sequencing and rearrangement analysis for the following 13 genes: ATM, BARD1, BRCA1, BRCA2, CDH1, CHEK2, NF1, PALB2, PTEN, RAD51C, RAD51D, STK11 and TP53.    12/02/2023 Surgery   Bilateral mastectomies: left benign.  Right: IDC, 1.2cm,  grade 2, margins negative, 1 SLN negative.   12/02/2023 Oncotype testing   14/4%   12/02/2023 Cancer Staging   Staging form: Breast, AJCC 8th Edition - Pathologic stage from 12/02/2023: Stage IA (pT1c, pN0, cM0, G2, ER+, PR+, HER2-, Oncotype DX score: 14) - Signed by Percival Brace, NP on 04/01/2024 Stage prefix: Initial diagnosis Multigene prognostic tests performed: Oncotype DX Recurrence score range: Greater than or equal to 11 Histologic grading system: 3 grade system   12/2023 -  Anti-estrogen oral therapy   Tamoxifen      INTERVAL HISTORY:  Kaitlyn Johnson to review her survivorship care plan detailing her treatment course for breast cancer, as well as monitoring long-term side effects of that treatment, education regarding health maintenance, screening, and overall wellness and health promotion.     Overall, Kaitlyn Johnson reports feeling quite well.  She is taking Tamoxifen  daily and tolerates it moderately well.  She has some hot flashes and moodiness but these are manageable for her.  REVIEW OF SYSTEMS:  Review of Systems  Constitutional:  Negative for appetite change, chills, fatigue, fever and unexpected weight change.  HENT:   Negative for hearing loss, lump/mass and trouble swallowing.   Eyes:  Negative for eye problems and icterus.  Respiratory:  Negative for chest tightness, cough and shortness of breath.   Cardiovascular:  Negative for chest pain, leg swelling and palpitations.  Gastrointestinal:  Negative for abdominal distention, abdominal pain, constipation, diarrhea, nausea and vomiting.  Endocrine: Positive for hot flashes.  Genitourinary:  Negative for difficulty urinating.   Musculoskeletal:  Negative for arthralgias.  Skin:  Negative for itching and rash.  Neurological:  Negative for dizziness, extremity weakness, headaches and numbness.  Hematological:  Negative for adenopathy. Does not bruise/bleed easily.  Psychiatric/Behavioral:  Negative for depression. The  patient is not nervous/anxious.   Breast: Denies any new nodularity, masses, tenderness, nipple changes, or nipple discharge.       PAST MEDICAL/SURGICAL HISTORY:  Past Medical History:  Diagnosis Date   Acute appendicitis 02/28/2017   Acute pancreatitis 11/30/2013   Anemia 11/26/2011   Anxiety    Asthma    Celiac and mesenteric artery injury    Cushing's syndrome (HCC)    06/21/16- "in remission"   Depression    Family history of breast cancer    Family history of colon cancer    H/O hiatal hernia    History of kidney stones    Iatrogenic Cushing's syndrome (HCC) 10/02/2013   Nausea vomiting and diarrhea 11/23/2013   Nausea with vomiting 11/30/2013   Palpitations 12/05/2018   Pancreatitis 11/29/2013   POTS (postural orthostatic tachycardia syndrome)    Shortness of breath dyspnea    with exertertion   Sphincter of Oddi dysfunction    Past Surgical History:  Procedure Laterality Date   ABDOMINAL HYSTERECTOMY  08/2010   ANTERIOR CERVICAL DECOMP/DISCECTOMY FUSION N/A 03/31/2013   Procedure: ANTERIOR CERVICAL DECOMPRESSION/DISCECTOMY FUSION 1 LEVEL Cervical five-six;  Surgeon: Augustine Blocker, MD;  Location: [facility];  Service: Neurosurgery;  Laterality: N/A;   APPENDECTOMY     BACK SURGERY     BREAST BIOPSY Right 11/12/2023   US  RT BREAST BX W LOC DEV 1ST LESION IMG BX SPEC US  GUIDE 11/12/2023 [facility]   BREAST RECONSTRUCTION WITH PLACEMENT OF TISSUE EXPANDER AND FLEX HD (ACELLULAR HYDRATED DERMIS) Bilateral 12/02/2023   Procedure: BILATERAL BREAST RECONSTRUCTION WITH PLACEMENT OF TISSUE EXPANDER AND FLEX HD (ACELLULAR HYDRATED DERMIS);  Surgeon: Barb Bonito, MD;  Location: [facility];  Service: Plastics;  Laterality: Bilateral;   Cholangio-Pancreatography with Spintectorotomy + Stent  07/16/2012   01/15/14   CHOLECYSTECTOMY     ESOPHAGOGASTRODUODENOSCOPY (EGD) WITH PROPOFOL  N/A 08/19/2018   Procedure: ESOPHAGOGASTRODUODENOSCOPY (EGD) WITH  PROPOFOL ;  Surgeon: Baldo Bonds, MD;  Location: [facility];  Service: Endoscopy;  Laterality: N/A;   LAPAROSCOPIC APPENDECTOMY N/A 02/28/2017   Procedure: APPENDECTOMY LAPAROSCOPIC;  Surgeon: Sim Dryer, MD;  Location: [facility];  Service: General;  Laterality: N/A;   LAPAROSCOPIC ENDOMETRIOSIS FULGURATION     LAPAROSCOPIC GASTRIC SLEEVE RESECTION N/A 07/15/2018   Procedure: LAPAROSCOPIC GASTRIC SLEEVE RESECTION, UPPER ENDO, ERAS Pathway;  Surgeon: Jacolyn Matar, MD;  Location: [facility];  Service: General;  Laterality: N/A;   LUMBAR LAMINECTOMY     LUMBAR LAMINECTOMY/DECOMPRESSION MICRODISCECTOMY Left 06/22/2016   Procedure: MICRODISCECTOMY LEFT LUMBAR FOUR-FIVE;  Surgeon: Augusto Blonder, MD;  Location: [facility];  Service: Neurosurgery;  Laterality: Left;   REDUCTION MAMMAPLASTY Bilateral    ROUX-EN-Y PROCEDURE  04/1999   SIMPLE MASTECTOMY WITH AXILLARY SENTINEL NODE BIOPSY Bilateral 12/02/2023   Procedure: BILATERAL MASTECTOMY WITH RIGHT AXILLARY SENTINEL NODE BIOPSY;  Surgeon: Oza Blumenthal, MD;  Location: [facility];  Service: General;  Laterality: Bilateral;  LMA PEC BLOCK     ALLERGIES:  Allergies  Allergen Reactions   Bee Venom Anaphylaxis   Reglan  [Metoclopramide ] Other (See Comments)    Reaction:  Oculogyric crisis    Shellfish Allergy Anaphylaxis   Adhesive [Tape] Hives   Measles, Mumps & Rubella Vac     Other reaction(s): slept for 28 hrs   Sulfa Antibiotics Rash   Yellow Dye Nausea Only and Rash    Occurred from oral iron with yellow enteric coating Has not tried any other oral iron  preparation (so can't rule out any other excipient as causative agent)     CURRENT MEDICATIONS:  Outpatient Encounter Medications as of 04/01/2024  Medication Sig Note   ABILIFY  MAINTENA 400 MG SRER injection Inject 400 mg into the muscle every 28 (twenty-eight) days.    acebutolol  (SECTRAL ) 200 MG capsule TAKE ONE CAPSULE BY MOUTH TWICE DAILY    albuterol   (VENTOLIN  HFA) 108 (90 Base) MCG/ACT inhaler Inhale 1 puff into the lungs as needed for wheezing or shortness of breath. 07/16/2023: prn   AMBIEN  CR 12.5 MG CR tablet Take 12.5 mg by mouth at bedtime. 11/04/2015: .    ARIPiprazole  ER (ABILIFY  MAINTENA) 400 MG PRSY prefilled syringe Inject 400 mg into the muscle every 28 (twenty-eight) days.    busPIRone  (BUSPAR ) 10 MG tablet Take 20 mg by mouth 3 (three) times daily.    clonazePAM  (KLONOPIN ) 0.5 MG tablet Take 0.5 mg by mouth 2 (two) times daily as needed for anxiety.    cyanocobalamin  (VITAMIN B12) 1000 MCG/ML injection Inject 1,000 mcg into the skin every 30 (thirty) days.    desipramine  (NORPRAMIN ) 25 MG tablet Take 25 mg by mouth at bedtime.    DEXILANT 30 MG capsule Take 30 mg by mouth 2 (two) times daily.    dicyclomine  (BENTYL ) 10 MG capsule Take 1 capsule (10 mg total) by mouth 3 (three) times daily as needed for spasms (Pain/ spasms).    ipratropium (ATROVENT ) 0.03 % nasal spray Place 2 sprays into both nostrils every 12 (twelve) hours.    ivabradine  (CORLANOR ) 7.5 MG TABS tablet Take 1 tablet (7.5 mg total) by mouth 2 (two) times daily with a meal.    lamoTRIgine  (LAMICTAL ) 100 MG tablet Take 100 mg by mouth at bedtime.    montelukast  (SINGULAIR ) 10 MG tablet Take 1 tablet (10 mg total) by mouth at bedtime.    ondansetron  (ZOFRAN -ODT) 4 MG disintegrating tablet DISSOLVE ONE TABLET BY MOUTH EVERY 8 HOURS AS NEEDED REFRACTORY NAUSEA/VOMITING    tamoxifen  (NOLVADEX ) 20 MG tablet Take 1 tablet (20 mg total) by mouth daily.    traMADol  (ULTRAM ) 50 MG tablet Take 2 tablets (100 mg total) by mouth every 8 (eight) hours as needed for moderate pain (pain score 4-6) or severe pain (pain score 7-10).    traZODone  (DESYREL ) 100 MG tablet Take 100-300 mg by mouth at bedtime as needed for sleep. 11/04/2015: .    acetaminophen  (TYLENOL ) 500 MG tablet Take 2 tablets (1,000 mg total) by mouth every 8 (eight) hours. (Patient not taking: Reported on 04/01/2024)     diphenoxylate -atropine  (LOMOTIL ) 2.5-0.025 MG tablet Take 1 tablet by mouth 4 (four) times daily as needed for diarrhea or loose stools. (Patient not taking: Reported on 04/01/2024)    EPINEPHrine  0.3 mg/0.3 mL IJ SOAJ injection Inject 0.3 mg into the muscle as needed for anaphylaxis. (Patient not taking: Reported on 04/01/2024) 07/16/2023: prn   levalbuterol  (XOPENEX  HFA) 45 MCG/ACT inhaler Inhale 1 puff into the lungs as needed for wheezing or shortness of breath. (Patient not taking: Reported on 04/01/2024) 07/16/2023: prn   levalbuterol  (XOPENEX ) 0.63 MG/3ML nebulizer solution Take 0.63 mg by nebulization every 6 (six) hours as needed for wheezing or shortness of breath. (Patient not taking: Reported on 04/01/2024) 07/16/2023: prn   No facility-administered encounter medications on file as of 04/01/2024.     ONCOLOGIC FAMILY HISTORY:  Family History  Problem Relation Age of Onset   Hypertension Mother    Thyroid  cancer Maternal Uncle  dx 36s   Stroke Paternal Aunt    Colon cancer Maternal Grandmother        dx 36s   Heart attack Maternal Grandfather    Aortic aneurysm Paternal Grandfather    Breast cancer Other        MGMs sister, d. 48s     SOCIAL HISTORY:  Social History   Socioeconomic History   Marital status: Married    Spouse name: Not on file   Number of children: 2   Years of education: Not on file   Highest education level: Associate degree: academic program  Occupational History   Not on file  Tobacco Use   Smoking status: Never    Passive exposure: Never   Smokeless tobacco: Never  Vaping Use   Vaping status: Never Used  Substance and Sexual Activity   Alcohol use: Yes    Alcohol/week: 5.0 standard drinks of alcohol    Types: 5 Glasses of wine per week    Comment: OCC   Drug use: No   Sexual activity: Yes    Partners: Male    Birth control/protection: Surgical  Other Topics Concern   Not on file  Social History Narrative   Regular exercise: can't at this  time   Caffeine use: no   Social Drivers of Corporate investment banker Strain: Low Risk  (10/01/2023)   Overall Financial Resource Strain (CARDIA)    Difficulty of Paying Living Expenses: Not hard at all  Food Insecurity: No Food Insecurity (10/01/2023)   Hunger Vital Sign    Worried About Running Out of Food in the Last Year: Never true    Ran Out of Food in the Last Year: Never true  Transportation Needs: No Transportation Needs (10/01/2023)   PRAPARE - Administrator, Civil Service (Medical): No    Lack of Transportation (Non-Medical): No  Physical Activity: Unknown (10/01/2023)   Exercise Vital Sign    Days of Exercise per Week: 0 days    Minutes of Exercise per Session: Not on file  Stress: No Stress Concern Present (10/01/2023)   [employer] of Occupational Health - Occupational Stress Questionnaire    Feeling of Stress : Not at all  Social Connections: Moderately Integrated (10/01/2023)   Social Connection and Isolation Panel [NHANES]    Frequency of Communication with Friends and Family: More than three times a week    Frequency of Social Gatherings with Friends and Family: Twice a week    Attends Religious Services: More than 4 times per year    Active Member of [employer] or Organizations: No    Attends Engineer, structural: Not on file    Marital Status: Married  Catering manager Violence: Not At Risk (07/17/2023)   Humiliation, Afraid, Rape, and Kick questionnaire    Fear of Current or Ex-Partner: No    Emotionally Abused: No    Physically Abused: No    Sexually Abused: No     OBSERVATIONS/OBJECTIVE:  BP 135/74 (BP Location: Left Arm, Patient Position: Sitting)   Pulse 69   Temp 98.4 F (36.9 C) (Oral)   Resp 16   SpO2 99%  GENERAL: Patient is a well appearing female in no acute distress HEENT:  Sclerae anicteric.  Oropharynx clear and moist. No ulcerations or evidence of oropharyngeal candidiasis. Neck is supple.  NODES:  No cervical,  supraclavicular, or axillary lymphadenopathy palpated.  BREAST EXAM:  s/p bilateral mastectomies with expanders in place, no sign of local recurrence.  LUNGS:  Clear to auscultation bilaterally.  No wheezes or rhonchi. HEART:  Regular rate and rhythm. No murmur appreciated. ABDOMEN:  Soft, nontender.  Positive, normoactive bowel sounds. No organomegaly palpated. MSK:  No focal spinal tenderness to palpation. Full range of motion bilaterally in the upper extremities. EXTREMITIES:  No peripheral edema.   SKIN:  Clear with no obvious rashes or skin changes. No nail dyscrasia. NEURO:  Nonfocal. Well oriented.  Appropriate affect.   LABORATORY DATA:  None for this visit.  DIAGNOSTIC IMAGING:  None for this visit.      ASSESSMENT AND PLAN:  Ms.. Kaitlyn Johnson is a pleasant 43 y.o. female with Stage IA right breast invasive ductal carcinoma, ER+/PR+/HER2-, diagnosed in 10/2023, treated with bilateral mastectomies and anti-estrogen therapy with Tamoxifen  beginning in 12/2023.  She presents to the Survivorship Clinic for our initial meeting and routine follow-up post-completion of treatment for breast cancer.    1. Stage IA right breast cancer:  Kaitlyn Johnson is continuing to recover from definitive treatment for breast cancer. She will follow-up with her medical oncologist, Dr.  Arno Bibles in 6 months with history and physical exam per surveillance protocol.  She will continue her anti-estrogen therapy with Tamoxifen . Thus far, she is tolerating the Tamoxifen  well, with minimal side effects. Since she has undergone bilateral mastectomies, she is no longer a candidate for mammograms.  She and I discussed Guardant reveal lab testing every 6 months to monitor for minimal residual disease, which she would like to undergo.    Today, a comprehensive survivorship care plan and treatment summary was reviewed with the patient today detailing her breast cancer diagnosis, treatment course, potential late/long-term effects of  treatment, appropriate follow-up care with recommendations for the future, and patient education resources.  A copy of this summary, along with a letter will be sent to the patient's primary care provider via mail/fax/In Basket message after today's visit.    2. Bone health:  She was given education on specific activities to promote bone health.  3. Cancer screening:  Due to Kaitlyn Johnson's history and her age, she should receive screening for skin cancers, colon cancer, and gynecologic cancers.  The information and recommendations are listed on the patient's comprehensive care plan/treatment summary and were reviewed in detail with the patient.    4. Health maintenance and wellness promotion: Kaitlyn Johnson was encouraged to consume 5-7 servings of fruits and vegetables per day. We reviewed the "Nutrition Rainbow" handout.  She was also encouraged to engage in moderate to vigorous exercise for 30 minutes per day most days of the week.  She was instructed to limit her alcohol consumption and continue to abstain from tobacco use.     5. Support services/counseling: It is not uncommon for this period of the patient's cancer care trajectory to be one of many emotions and stressors.   She was given information regarding our available services and encouraged to contact me with any questions or for help enrolling in any of our support group/programs.   6. Monoallelic mutation of CTFR gene, SPINK1 mutation: Continue f/u with [facility] for high risk pancreatic cancer screenings.    Follow up instructions:    -Return to cancer center 6 months for f/u with Dr. Arno Bibles -Guardant Reveal lab testing every 6 months -She is welcome to return back to the Survivorship Clinic at any time; no additional follow-up needed at this time.  -Consider referral back to survivorship as a long-term survivor for continued surveillance  The patient was provided  an opportunity to ask questions and all were answered. The patient agreed with  the plan and demonstrated an understanding of the instructions.   Total encounter time:40 minutes*in face-to-face visit time, chart review, lab review, care coordination, order entry, and documentation of the encounter time.    Alwin Baars, NP 04/01/24 10:58 AM Medical Oncology and Hematology [facility] [address] [place], [place] [address] Tel. [phone]    Fax. [phone]  *Total Encounter Time as defined by the Centers for Medicare and Medicaid Services includes, in addition to the face-to-face time of a patient visit (documented in the note above) non-face-to-face time: obtaining and reviewing outside history, ordering and reviewing medications, tests or procedures, care coordination (communications with other health care professionals or caregivers) and documentation in the medical record.

## 2024-04-02 ENCOUNTER — Encounter: Payer: Self-pay | Admitting: Adult Health

## 2024-04-06 DIAGNOSIS — H1820 Unspecified corneal edema: Secondary | ICD-10-CM | POA: Diagnosis not present

## 2024-04-08 DIAGNOSIS — C50411 Malignant neoplasm of upper-outer quadrant of right female breast: Secondary | ICD-10-CM | POA: Diagnosis not present

## 2024-04-10 ENCOUNTER — Encounter: Payer: Self-pay | Admitting: Adult Health

## 2024-04-13 LAB — GUARDANT REVEAL

## 2024-04-20 ENCOUNTER — Other Ambulatory Visit: Payer: Self-pay | Admitting: Family Medicine

## 2024-04-20 DIAGNOSIS — K3184 Gastroparesis: Secondary | ICD-10-CM

## 2024-04-21 ENCOUNTER — Ambulatory Visit
Admission: RE | Admit: 2024-04-21 | Discharge: 2024-04-21 | Disposition: A | Discharge status: Home / Self Care | Source: Ambulatory Visit

## 2024-04-21 DIAGNOSIS — R1110 Vomiting, unspecified: Secondary | ICD-10-CM | POA: Diagnosis not present

## 2024-04-21 DIAGNOSIS — Z9884 Bariatric surgery status: Secondary | ICD-10-CM | POA: Diagnosis not present

## 2024-04-22 DIAGNOSIS — F411 Generalized anxiety disorder: Secondary | ICD-10-CM | POA: Diagnosis not present

## 2024-04-22 DIAGNOSIS — F605 Obsessive-compulsive personality disorder: Secondary | ICD-10-CM | POA: Diagnosis not present

## 2024-04-22 DIAGNOSIS — F331 Major depressive disorder, recurrent, moderate: Secondary | ICD-10-CM | POA: Diagnosis not present

## 2024-05-06 DIAGNOSIS — Z9011 Acquired absence of right breast and nipple: Secondary | ICD-10-CM | POA: Diagnosis not present

## 2024-05-06 DIAGNOSIS — Z9013 Acquired absence of bilateral breasts and nipples: Secondary | ICD-10-CM | POA: Diagnosis not present

## 2024-05-06 DIAGNOSIS — C50911 Malignant neoplasm of unspecified site of right female breast: Secondary | ICD-10-CM | POA: Diagnosis not present

## 2024-05-19 ENCOUNTER — Encounter: Payer: Self-pay | Admitting: Adult Health

## 2024-05-21 DIAGNOSIS — M25571 Pain in right ankle and joints of right foot: Secondary | ICD-10-CM | POA: Diagnosis not present

## 2024-05-25 ENCOUNTER — Telehealth: Payer: Self-pay

## 2024-05-25 NOTE — Telephone Encounter (Signed)
 Pt verbally confirmed appt for 7/29

## 2024-05-26 ENCOUNTER — Inpatient Hospital Stay: Attending: Hematology and Oncology | Admitting: Hematology and Oncology

## 2024-05-26 VITALS — BP 117/57 | HR 66 | Temp 98.0°F | Resp 16 | Wt 206.0 lb

## 2024-05-26 DIAGNOSIS — Z9013 Acquired absence of bilateral breasts and nipples: Secondary | ICD-10-CM | POA: Diagnosis not present

## 2024-05-26 DIAGNOSIS — Z170 Estrogen receptor positive status [ER+]: Secondary | ICD-10-CM | POA: Diagnosis not present

## 2024-05-26 DIAGNOSIS — Z79899 Other long term (current) drug therapy: Secondary | ICD-10-CM | POA: Diagnosis not present

## 2024-05-26 DIAGNOSIS — C50411 Malignant neoplasm of upper-outer quadrant of right female breast: Secondary | ICD-10-CM | POA: Diagnosis not present

## 2024-05-26 DIAGNOSIS — Z79810 Long term (current) use of selective estrogen receptor modulators (SERMs): Secondary | ICD-10-CM | POA: Insufficient documentation

## 2024-05-26 NOTE — Progress Notes (Signed)
 [facility] CONSULT NOTE  Patient Care Team: Sebastian Beverley NOVAK, MD as PCP - General (Family Medicine) Ladona Heinz, MD as PCP - Cardiology (Cardiology) Loretha Ash, MD as Consulting Physician (Hematology and Oncology) Vernetta Berg, MD as Consulting Physician (General Surgery)  CHIEF COMPLAINTS/PURPOSE OF CONSULTATION:  New onset breast cancer  ASSESSMENT & PLAN:   Assessment and Plan Assessment & Plan Right breast mass, status post right mastectomy and reconstruction Palpable mass near axillary scar, likely a fat globule post-surgery. Low malignancy risk. - Order ultrasound at breast center. - Contact her with results.  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 Left mastectomy with benign breast tissue. She is on adj tamoxifen , tolerating it well.   HISTORY OF PRESENTING ILLNESS:   History of Present Illness    Discussed the use of AI scribe software for clinical note transcription with the patient, who gave verbal consent to proceed.  History of Present Illness Dane Bloch is a 43 year old female with breast cancer who presents with a palpable abnormality in the right breast post-reconstructive surgery. She was referred by Dr. Gabe nurse for evaluation of a palpable abnormality post-surgery.  She underwent reconstructive breast surgery on July 9th following a right mastectomy. Approximately one week ago, she noticed an abnormality in the right breast near the axillary scar, where a lymph node was previously removed. The abnormality is tender but not painful. Others have confirmed the presence of the abnormality.  She is currently taking tamoxifen , which causes some fatigue but is otherwise tolerable. She began this medication around February or March. She has experienced some weight loss, estimating around ten pounds, which she attributes to wearing a boot due to a recent leg injury.  The leg injury occurred when she  accidentally stepped on her foot while it was asleep, resulting in a tendon pop as diagnosed by an orthopedic doctor. This incident happened last week.  She has twin seven-year-old children.    MEDICAL HISTORY:  Past Medical History:  Diagnosis Date   Acute appendicitis 02/28/2017   Acute pancreatitis 11/30/2013   Anemia 11/26/2011   Anxiety    Asthma    Celiac and mesenteric artery injury    Cushing's syndrome (HCC)    06/21/16- in remission   Depression    Family history of breast cancer    Family history of colon cancer    H/O hiatal hernia    History of kidney stones    Iatrogenic Cushing's syndrome (HCC) 10/02/2013   Nausea vomiting and diarrhea 11/23/2013   Nausea with vomiting 11/30/2013   Palpitations 12/05/2018   Pancreatitis 11/29/2013   POTS (postural orthostatic tachycardia syndrome)    Shortness of breath dyspnea    with exertertion   Sphincter of Oddi dysfunction     SURGICAL HISTORY: Past Surgical History:  Procedure Laterality Date   ABDOMINAL HYSTERECTOMY  08/2010   ANTERIOR CERVICAL DECOMP/DISCECTOMY FUSION N/A 03/31/2013   Procedure: ANTERIOR CERVICAL DECOMPRESSION/DISCECTOMY FUSION 1 LEVEL Cervical five-six;  Surgeon: Darina MALVA Boehringer, MD;  Location: [facility];  Service: Neurosurgery;  Laterality: N/A;   APPENDECTOMY     BACK SURGERY     BREAST BIOPSY Right 11/12/2023   US  RT BREAST BX W LOC DEV 1ST LESION IMG BX SPEC US  GUIDE 11/12/2023 [facility]   BREAST RECONSTRUCTION WITH PLACEMENT OF TISSUE EXPANDER AND FLEX HD (ACELLULAR HYDRATED DERMIS) Bilateral 12/02/2023   Procedure: BILATERAL BREAST RECONSTRUCTION WITH PLACEMENT OF TISSUE EXPANDER AND FLEX HD (  ACELLULAR HYDRATED DERMIS);  Surgeon: Elisabeth Craig RAMAN, MD;  Location: [facility];  Service: Plastics;  Laterality: Bilateral;   Cholangio-Pancreatography with Spintectorotomy + Stent  07/16/2012   01/15/14   CHOLECYSTECTOMY     ESOPHAGOGASTRODUODENOSCOPY (EGD) WITH PROPOFOL  N/A  08/19/2018   Procedure: ESOPHAGOGASTRODUODENOSCOPY (EGD) WITH PROPOFOL ;  Surgeon: Dianna Specking, MD;  Location: [facility];  Service: Endoscopy;  Laterality: N/A;   LAPAROSCOPIC APPENDECTOMY N/A 02/28/2017   Procedure: APPENDECTOMY LAPAROSCOPIC;  Surgeon: Vanderbilt Ned, MD;  Location: [facility];  Service: General;  Laterality: N/A;   LAPAROSCOPIC ENDOMETRIOSIS FULGURATION     LAPAROSCOPIC GASTRIC SLEEVE RESECTION N/A 07/15/2018   Procedure: LAPAROSCOPIC GASTRIC SLEEVE RESECTION, UPPER ENDO, ERAS Pathway;  Surgeon: Gladis Cough, MD;  Location: [facility];  Service: General;  Laterality: N/A;   LUMBAR LAMINECTOMY     LUMBAR LAMINECTOMY/DECOMPRESSION MICRODISCECTOMY Left 06/22/2016   Procedure: MICRODISCECTOMY LEFT LUMBAR FOUR-FIVE;  Surgeon: Gerldine Maizes, MD;  Location: [facility];  Service: Neurosurgery;  Laterality: Left;   REDUCTION MAMMAPLASTY Bilateral    ROUX-EN-Y PROCEDURE  04/1999   SIMPLE MASTECTOMY WITH AXILLARY SENTINEL NODE BIOPSY Bilateral 12/02/2023   Procedure: BILATERAL MASTECTOMY WITH RIGHT AXILLARY SENTINEL NODE BIOPSY;  Surgeon: Vernetta Berg, MD;  Location: [facility];  Service: General;  Laterality: Bilateral;  LMA PEC BLOCK    SOCIAL HISTORY: Social History   Socioeconomic History   Marital status: Married    Spouse name: Not on file   Number of children: 2   Years of education: Not on file   Highest education level: Associate degree: academic program  Occupational History   Not on file  Tobacco Use   Smoking status: Never    Passive exposure: Never   Smokeless tobacco: Never  Vaping Use   Vaping status: Never Used  Substance and Sexual Activity   Alcohol use: Yes    Alcohol/week: 5.0 standard drinks of alcohol    Types: 5 Glasses of wine per week    Comment: OCC   Drug use: No   Sexual activity: Yes    Partners: Male    Birth control/protection: Surgical  Other Topics Concern   Not on file  Social History Narrative    Regular exercise: can't at this time   Caffeine use: no   Social Drivers of Corporate investment banker Strain: Low Risk  (10/01/2023)   Overall Financial Resource Strain (CARDIA)    Difficulty of Paying Living Expenses: Not hard at all  Food Insecurity: No Food Insecurity (10/01/2023)   Hunger Vital Sign    Worried About Running Out of Food in the Last Year: Never true    Ran Out of Food in the Last Year: Never true  Transportation Needs: No Transportation Needs (10/01/2023)   PRAPARE - Administrator, Civil Service (Medical): No    Lack of Transportation (Non-Medical): No  Physical Activity: Unknown (10/01/2023)   Exercise Vital Sign    Days of Exercise per Week: 0 days    Minutes of Exercise per Session: Not on file  Stress: No Stress Concern Present (10/01/2023)   [employer] of Occupational Health - Occupational Stress Questionnaire    Feeling of Stress : Not at all  Social Connections: Moderately Integrated (10/01/2023)   Social Connection and Isolation Panel    Frequency of Communication with Friends and Family: More than three times a week    Frequency of Social Gatherings with Friends and Family: Twice a week    Attends  Religious Services: More than 4 times per year    Active Member of Clubs or Organizations: No    Attends Banker Meetings: Not on file    Marital Status: Married  Intimate Partner Violence: Not At Risk (07/17/2023)   Humiliation, Afraid, Rape, and Kick questionnaire    Fear of Current or Ex-Partner: No    Emotionally Abused: No    Physically Abused: No    Sexually Abused: No    FAMILY HISTORY: Family History  Problem Relation Age of Onset   Hypertension Mother    Thyroid  cancer Maternal Uncle        dx 73s   Stroke Paternal Aunt    Colon cancer Maternal Grandmother        dx 6s   Heart attack Maternal Grandfather    Aortic aneurysm Paternal Grandfather    Breast cancer Other        MGMs sister, d. 21s    ALLERGIES:   is allergic to bee venom; reglan  [metoclopramide ]; shellfish allergy; adhesive [tape]; measles, mumps & rubella vac; sulfa antibiotics; and yellow dye.  MEDICATIONS:  Current Outpatient Medications  Medication Sig Dispense Refill   ABILIFY  MAINTENA 400 MG SRER injection Inject 400 mg into the muscle every 28 (twenty-eight) days.     acebutolol  (SECTRAL ) 200 MG capsule TAKE ONE CAPSULE BY MOUTH TWICE DAILY 180 capsule 3   albuterol  (VENTOLIN  HFA) 108 (90 Base) MCG/ACT inhaler Inhale 1 puff into the lungs as needed for wheezing or shortness of breath.     AMBIEN  CR 12.5 MG CR tablet Take 12.5 mg by mouth at bedtime.  1   ARIPiprazole  ER (ABILIFY  MAINTENA) 400 MG PRSY prefilled syringe Inject 400 mg into the muscle every 28 (twenty-eight) days. 3 each 1   busPIRone  (BUSPAR ) 10 MG tablet Take 20 mg by mouth 3 (three) times daily.     clonazePAM  (KLONOPIN ) 0.5 MG tablet Take 0.5 mg by mouth 2 (two) times daily as needed for anxiety.     cyanocobalamin  (VITAMIN B12) 1000 MCG/ML injection Inject 1,000 mcg into the skin every 30 (thirty) days.     desipramine  (NORPRAMIN ) 25 MG tablet Take 25 mg by mouth at bedtime.     DEXILANT 30 MG capsule Take 30 mg by mouth 2 (two) times daily.     dicyclomine  (BENTYL ) 10 MG capsule Take 1 capsule (10 mg total) by mouth 3 (three) times daily as needed for spasms (Pain/ spasms).     diphenoxylate -atropine  (LOMOTIL ) 2.5-0.025 MG tablet Take 1 tablet by mouth 4 (four) times daily as needed for diarrhea or loose stools.     EPINEPHrine  0.3 mg/0.3 mL IJ SOAJ injection Inject 0.3 mg into the muscle as needed for anaphylaxis. 1 each 3   ipratropium (ATROVENT ) 0.03 % nasal spray Place 2 sprays into both nostrils every 12 (twelve) hours. 30 mL 12   ivabradine  (CORLANOR ) 7.5 MG TABS tablet Take 1 tablet (7.5 mg total) by mouth 2 (two) times daily with a meal. 180 tablet 3   lamoTRIgine  (LAMICTAL ) 100 MG tablet Take 100 mg by mouth at bedtime.     levalbuterol  (XOPENEX  HFA)  45 MCG/ACT inhaler Inhale 1 puff into the lungs as needed for wheezing or shortness of breath.     levalbuterol  (XOPENEX ) 0.63 MG/3ML nebulizer solution Take 0.63 mg by nebulization every 6 (six) hours as needed for wheezing or shortness of breath.     montelukast  (SINGULAIR ) 10 MG tablet Take 1 tablet (10 mg total) by  mouth at bedtime. 30 tablet 3   ondansetron  (ZOFRAN -ODT) 4 MG disintegrating tablet DISSOLVE ONE TABLET BY MOUTH EVERY 8 HOURS AS NEEDED REFRACTORY NAUSEA/VOMITING 30 tablet 0   tamoxifen  (NOLVADEX ) 20 MG tablet Take 1 tablet (20 mg total) by mouth daily. 90 tablet 3   traMADol  (ULTRAM ) 50 MG tablet Take 2 tablets (100 mg total) by mouth every 8 (eight) hours as needed for moderate pain (pain score 4-6) or severe pain (pain score 7-10). 180 tablet 5   traZODone  (DESYREL ) 100 MG tablet Take 100-300 mg by mouth at bedtime as needed for sleep.     acetaminophen  (TYLENOL ) 500 MG tablet Take 2 tablets (1,000 mg total) by mouth every 8 (eight) hours. (Patient not taking: Reported on 05/26/2024) 30 tablet 0   No current facility-administered medications for this visit.     PHYSICAL EXAMINATION: ECOG PERFORMANCE STATUS: 0 - Asymptomatic  Vitals:   05/26/24 1347  BP: (!) 117/57  Pulse: 66  Resp: 16  Temp: 98 F (36.7 C)  SpO2: 100%    Filed Weights   05/26/24 1347  Weight: 206 lb (93.4 kg)     GENERAL:alert, no distress and comfortable Palpable area of abnormality by the patient is right outside the edge of the breast likely some fat necrosis. I do not believe this is pathologic lymphadenopathy NO LE edema  LABORATORY DATA:  I have reviewed the data as listed Lab Results  Component Value Date   WBC 4.9 04/01/2024   HGB 11.4 (L) 04/01/2024   HCT 34.7 (L) 04/01/2024   MCV 90.4 04/01/2024   PLT 233 04/01/2024     Chemistry      Component Value Date/Time   NA 141 04/01/2024 1013   NA 140 07/28/2019 1520   NA 138 09/07/2013 1409   K 3.8 04/01/2024 1013   K 3.7  09/07/2013 1409   CL 107 04/01/2024 1013   CL 102 11/11/2012 1324   CO2 28 04/01/2024 1013   CO2 23 09/07/2013 1409   BUN 5 (L) 04/01/2024 1013   BUN 5 (L) 07/28/2019 1520   BUN 12.4 09/07/2013 1409   CREATININE 0.79 04/01/2024 1013   CREATININE 0.81 07/06/2022 1539   CREATININE 0.8 09/07/2013 1409      Component Value Date/Time   CALCIUM  8.6 (L) 04/01/2024 1013   CALCIUM  9.4 09/07/2013 1409   ALKPHOS 52 04/01/2024 1013   ALKPHOS 81 09/07/2013 1409   AST 17 04/01/2024 1013   AST 14 09/07/2013 1409   ALT 14 04/01/2024 1013   ALT 28 09/07/2013 1409   BILITOT 0.3 04/01/2024 1013   BILITOT <0.20 09/07/2013 1409       RADIOGRAPHIC STUDIES: I have personally reviewed the radiological images as listed and agreed with the findings in the report. No results found.   All questions were answered. The patient knows to call the clinic with any problems, questions or concerns.     Amber Stalls, MD 05/26/2024 1:59 PM

## 2024-05-28 ENCOUNTER — Inpatient Hospital Stay
Admission: RE | Admit: 2024-05-28 | Discharge: 2024-05-28 | Discharge status: Home / Self Care | Source: Ambulatory Visit | Attending: Hematology and Oncology

## 2024-05-28 ENCOUNTER — Other Ambulatory Visit: Payer: Self-pay | Admitting: Hematology and Oncology

## 2024-05-28 DIAGNOSIS — R918 Other nonspecific abnormal finding of lung field: Secondary | ICD-10-CM | POA: Diagnosis not present

## 2024-05-28 DIAGNOSIS — C50411 Malignant neoplasm of upper-outer quadrant of right female breast: Secondary | ICD-10-CM

## 2024-05-28 DIAGNOSIS — Z853 Personal history of malignant neoplasm of breast: Secondary | ICD-10-CM | POA: Diagnosis not present

## 2024-05-29 DIAGNOSIS — Z3189 Encounter for other procreative management: Secondary | ICD-10-CM | POA: Diagnosis not present

## 2024-06-01 ENCOUNTER — Ambulatory Visit: Payer: Self-pay | Admitting: Hematology and Oncology

## 2024-06-24 ENCOUNTER — Emergency Department (HOSPITAL_BASED_OUTPATIENT_CLINIC_OR_DEPARTMENT_OTHER)
Admission: EM | Admit: 2024-06-24 | Discharge: 2024-06-24 | Disposition: A | Discharge status: Home / Self Care | Attending: Emergency Medicine | Admitting: Emergency Medicine

## 2024-06-24 ENCOUNTER — Emergency Department (HOSPITAL_BASED_OUTPATIENT_CLINIC_OR_DEPARTMENT_OTHER)

## 2024-06-24 ENCOUNTER — Telehealth: Payer: Self-pay | Admitting: Cardiology

## 2024-06-24 ENCOUNTER — Encounter (HOSPITAL_BASED_OUTPATIENT_CLINIC_OR_DEPARTMENT_OTHER): Payer: Self-pay

## 2024-06-24 ENCOUNTER — Other Ambulatory Visit: Payer: Self-pay

## 2024-06-24 DIAGNOSIS — R0602 Shortness of breath: Secondary | ICD-10-CM | POA: Diagnosis not present

## 2024-06-24 DIAGNOSIS — Z9049 Acquired absence of other specified parts of digestive tract: Secondary | ICD-10-CM | POA: Diagnosis not present

## 2024-06-24 DIAGNOSIS — Z853 Personal history of malignant neoplasm of breast: Secondary | ICD-10-CM | POA: Diagnosis not present

## 2024-06-24 DIAGNOSIS — R079 Chest pain, unspecified: Secondary | ICD-10-CM | POA: Diagnosis not present

## 2024-06-24 DIAGNOSIS — R0781 Pleurodynia: Secondary | ICD-10-CM | POA: Diagnosis not present

## 2024-06-24 DIAGNOSIS — M79662 Pain in left lower leg: Secondary | ICD-10-CM | POA: Insufficient documentation

## 2024-06-24 LAB — TROPONIN T, HIGH SENSITIVITY: Troponin T High Sensitivity: <15 ng/L (ref 0–19)

## 2024-06-24 LAB — CBC
HCT: 35.7 % — ABNORMAL LOW (ref 36.0–46.0)
Hemoglobin: 11.7 g/dL — ABNORMAL LOW (ref 12.0–15.0)
MCH: 29.5 pg (ref 26.0–34.0)
MCHC: 32.8 g/dL (ref 30.0–36.0)
MCV: 90.2 fL (ref 80.0–100.0)
Platelets: 242 K/uL (ref 150–400)
RBC: 3.96 MIL/uL (ref 3.87–5.11)
RDW: 12.8 % (ref 11.5–15.5)
WBC: 6.3 K/uL (ref 4.0–10.5)
nRBC: 0 % (ref 0.0–0.2)

## 2024-06-24 LAB — BASIC METABOLIC PANEL WITH GFR
Anion gap: 11 (ref 5–15)
BUN: 11 mg/dL (ref 6–20)
CO2: 25 mmol/L (ref 22–32)
Calcium: 9 mg/dL (ref 8.9–10.3)
Chloride: 103 mmol/L (ref 98–111)
Creatinine, Ser: 0.96 mg/dL (ref 0.44–1.00)
GFR, Estimated: >60 mL/min (ref >60)
Glucose, Bld: 97 mg/dL (ref 70–99)
Potassium: 4.1 mmol/L (ref 3.5–5.1)
Sodium: 139 mmol/L (ref 135–145)

## 2024-06-24 MED ORDER — IOHEXOL 350 MG/ML SOLN
80.0000 mL | Freq: Once | INTRAVENOUS | Status: AC | PRN
  Administered 2024-06-24: 80 mL via INTRAVENOUS

## 2024-06-24 NOTE — Telephone Encounter (Signed)
   Pt c/o of Chest Pain: STAT if active CP, including tightness, pressure, jaw pain, radiating pain to shoulder/upper arm/back, CP unrelieved by Nitro. Symptoms reported of SOB, nausea, vomiting, sweating.  1. Are you having CP right now? Yes     2. Are you experiencing any other symptoms (ex. SOB, nausea, vomiting, sweating)? SOB    3. Is your CP continuous or coming and going? continuous    4. Have you taken Nitroglycerin? No    5. How long have you been experiencing CP? Today an almost 2 hours ago     6. If NO CP at time of call then end call with telling Pt to call back or call 911 if Chest pain returns prior to return call from triage team.

## 2024-06-24 NOTE — ED Notes (Signed)
 Attempted to get IV access. Pt restricted to just using the left arm. Labs sent

## 2024-06-24 NOTE — ED Provider Notes (Signed)
 [facility] Provider Note   CSN: [id number] Arrival date & time: 06/24/24  1512     Patient presents with: Chest Pain   Kaitlyn Johnson is a 43 y.o. female.  {Add pertinent medical, surgical, social history, OB history to HPI:32947} HPI Patient with history of grade 2 IDC with DCIS s/p bilateral mastectomy and reconstruction, POTS. Patient reports that she was out shopping this afternoon and she got a very abrupt onset of pain under her left breast.  She reports this pain was worse with deep inspiration and movement.  She felt slightly short of breath.  She reports pain did radiate up towards her shoulder and armpit.  Patient denies any history of prior similar pain.  She reports she does have history of POTS but has never experienced similar pain in association with this and did not have lightheadedness or near syncope.  Patient reports that she has had some pain in the back of her left calf for several days.  She reports is actually better today.  No recent long travel.  Patient however did have breast reconstruction surgery status post mastectomy for breast cancer.  Patient reports that the reconstructions have been healing well and she has not been having any pain.  She does not feel that the pain is associated with the soft tissues of the breast or beneath the breast and the chest wall.  No recent productive cough fever chills.    Prior to Admission medications   Medication Sig Start Date End Date Taking? Authorizing Provider  ABILIFY  MAINTENA 400 MG SRER injection Inject 400 mg into the muscle every 28 (twenty-eight) days. 05/25/21   [provider]  acebutolol  (SECTRAL ) 200 MG capsule TAKE ONE CAPSULE BY MOUTH TWICE DAILY 11/22/23   Ladona Heinz, MD  acetaminophen  (TYLENOL ) 500 MG tablet Take 2 tablets (1,000 mg total) by mouth every 8 (eight) hours. Patient not taking: Reported on 05/26/2024 08/25/18   Doug Lyle LABOR, PA-C   albuterol  (VENTOLIN  HFA) 108 (90 Base) MCG/ACT inhaler Inhale 1 puff into the lungs as needed for wheezing or shortness of breath. 01/20/15   [provider]  AMBIEN  CR 12.5 MG CR tablet Take 12.5 mg by mouth at bedtime. 10/31/15   [provider]  ARIPiprazole  ER (ABILIFY  MAINTENA) 400 MG PRSY prefilled syringe Inject 400 mg into the muscle every 28 (twenty-eight) days. 07/18/23     busPIRone  (BUSPAR ) 10 MG tablet Take 20 mg by mouth 3 (three) times daily. 02/20/21   [provider]  clonazePAM  (KLONOPIN ) 0.5 MG tablet Take 0.5 mg by mouth 2 (two) times daily as needed for anxiety.    [provider]  cyanocobalamin  (VITAMIN B12) 1000 MCG/ML injection Inject 1,000 mcg into the skin every 30 (thirty) days. 01/06/16   [provider]  desipramine  (NORPRAMIN ) 25 MG tablet Take 25 mg by mouth at bedtime. 10/09/22   [provider]  DEXILANT 30 MG capsule Take 30 mg by mouth 2 (two) times daily. 03/29/21   [provider]  dicyclomine  (BENTYL ) 10 MG capsule Take 1 capsule (10 mg total) by mouth 3 (three) times daily as needed for spasms (Pain/ spasms). 07/24/23   Rai, Nydia POUR, MD  diphenoxylate -atropine  (LOMOTIL ) 2.5-0.025 MG tablet Take 1 tablet by mouth 4 (four) times daily as needed for diarrhea or loose stools.    [provider]  EPINEPHrine  0.3 mg/0.3 mL IJ SOAJ injection Inject 0.3 mg into the muscle as needed for  anaphylaxis. 06/10/23   Sebastian Beverley NOVAK, MD  ipratropium (ATROVENT ) 0.03 % nasal spray Place 2 sprays into both nostrils every 12 (twelve) hours. 04/19/23   Sebastian Beverley NOVAK, MD  ivabradine  (CORLANOR ) 7.5 MG TABS tablet Take 1 tablet (7.5 mg total) by mouth 2 (two) times daily with a meal. 02/04/24   Ladona Heinz, MD  lamoTRIgine  (LAMICTAL ) 100 MG tablet Take 100 mg by mouth at bedtime. 09/18/21   [provider]  levalbuterol  (XOPENEX  HFA) 45 MCG/ACT inhaler Inhale 1 puff into the lungs as needed for wheezing or  shortness of breath.    [provider]  levalbuterol  (XOPENEX ) 0.63 MG/3ML nebulizer solution Take 0.63 mg by nebulization every 6 (six) hours as needed for wheezing or shortness of breath.    [provider]  montelukast  (SINGULAIR ) 10 MG tablet Take 1 tablet (10 mg total) by mouth at bedtime. 03/04/24   Sebastian Beverley NOVAK, MD  ondansetron  (ZOFRAN -ODT) 4 MG disintegrating tablet DISSOLVE ONE TABLET BY MOUTH EVERY 8 HOURS AS NEEDED REFRACTORY NAUSEA/VOMITING 04/20/24   Sebastian Beverley NOVAK, MD  tamoxifen  (NOLVADEX ) 20 MG tablet Take 1 tablet (20 mg total) by mouth daily. 12/30/23   Iruku, Praveena, MD  traZODone  (DESYREL ) 100 MG tablet Take 100-300 mg by mouth at bedtime as needed for sleep.    [provider]    Allergies: Bee venom; Reglan  [metoclopramide ]; Shellfish allergy; Adhesive [tape]; Measles, mumps & rubella vac; Sulfa antibiotics; and Yellow dye #6 (sunset yellow)    Review of Systems  Updated Vital Signs BP (!) 140/95   Pulse 80   Temp 98.2 F (36.8 C) (Oral)   Ht 5' 9 (1.753 m)   Wt 87.5 kg   SpO2 100%   BMI 28.50 kg/m   Physical Exam Constitutional:      Comments: Alert nontoxic in appearance.  No respiratory distress.  HENT:     Head: Normocephalic and atraumatic.     Mouth/Throat:     Pharynx: Oropharynx is clear.  Eyes:     Extraocular Movements: Extraocular movements intact.  Cardiovascular:     Rate and Rhythm: Normal rate and regular rhythm.  Pulmonary:     Effort: Pulmonary effort is normal.     Breath sounds: Normal breath sounds.     Comments: Patient well-healed breast reconstruction on the left.  No erythema around well-healing scars.  No diffuse erythema to suggest cellulitis.  Nontender palpation of the chest wall and breast tissues. Abdominal:     General: There is no distension.     Palpations: Abdomen is soft.     Tenderness: There is no abdominal tenderness. There is no guarding.  Musculoskeletal:        General: No  swelling or tenderness. Normal range of motion.     Cervical back: Neck supple.     Right lower leg: No edema.     Left lower leg: No edema.  Skin:    General: Skin is warm and dry.  Neurological:     General: No focal deficit present.     Mental Status: She is oriented to person, place, and time.     Motor: No weakness.     Coordination: Coordination normal.  Psychiatric:        Mood and Affect: Mood normal.     (all labs ordered are listed, but only abnormal results are displayed) Labs Reviewed  CBC - Abnormal; Notable for the following components:      Result Value   Hemoglobin  11.7 (*)    HCT 35.7 (*)    All other components within normal limits  BASIC METABOLIC PANEL WITH GFR  TROPONIN T, HIGH SENSITIVITY  TROPONIN T, HIGH SENSITIVITY    EKG: EKG Interpretation Date/Time:  Wednesday June 24 2024 15:26:17 EDT Ventricular Rate:  81 PR Interval:  144 QRS Duration:  89 QT Interval:  427 QTC Calculation: 496 R Axis:   65  Text Interpretation: Sinus rhythm Consider right atrial enlargement Borderline T wave abnormalities Borderline prolonged QT interval no sig change from previous Confirmed by Armenta Canning [id number]) on 06/24/2024 3:51:10 PM  Radiology: DG Chest 2 View Result Date: 06/24/2024 CLINICAL DATA:  Chest pain. EXAM: CHEST - 2 VIEW COMPARISON:  10/24/2022. FINDINGS: Bilateral lung fields are clear. Bilateral costophrenic angles are clear. Normal cardio-mediastinal silhouette. No acute osseous abnormalities. Lower cervical spinal fixation hardware noted. The soft tissues are within normal limits. There are surgical clips in the right upper quadrant, typical of a previous cholecystectomy. IMPRESSION: No active cardiopulmonary disease. Electronically Signed   By: Ree Molt M.D.   On: 06/24/2024 15:51    {Document cardiac monitor, telemetry assessment procedure when appropriate:32947} Procedures   Medications Ordered in the ED  iohexol  (OMNIPAQUE ) 350 MG/ML  injection 80 mL (has no administration in time range)      {Click here for ABCD2, HEART and other calculators REFRESH Note before signing:1}                              Medical Decision Making Amount and/or Complexity of Data Reviewed Labs: ordered. Radiology: ordered.  Risk Prescription drug management.   Patient has sudden onset sided chest pain pleuritic in quality with some shortness of breath.  Patient does have a PE or recent surgical management with reconstructive breast surgery.  Patient does report there 4 days ago.  Currently no vomiting.  Combined risk factors and quality of pain we will proceed with CT PE study.  {Document critical care time when appropriate  Document review of labs and clinical decision tools ie CHADS2VASC2, etc  Document your independent review of radiology images and any outside records  Document your discussion with family members, caretakers and with consultants  Document social determinants of health affecting pt's care  Document your decision making why or why not admission, treatments were needed:32947:::1}   Final diagnoses:  None    ED Discharge Orders     None

## 2024-06-24 NOTE — Telephone Encounter (Signed)
 Call sent straight to triage. Patient complaining of pressure and sharp pain in her chest that has been continuous with and without activity since 12:45 pm. Patient also complaining of SOB with activity. Patient's current BP 148/92 and HR 74. Patient is home alone and has no one to take her to ED. Encouraged patient to call 911, that it is not safe for patient to drive to the ED. Patient agreed to plan. Will send message to Dr. Ladona for further advisement.

## 2024-06-24 NOTE — Discharge Instructions (Addendum)
 1.  You may take anti-inflammatories such as ibuprofen  or naproxen  for chest pain. 2.  Return if you have sudden new symptoms or concerning changes. 3.  Your pain may be due to pleurisy.  This is an inflammation of the lung covering.  This usually resolves with time and anti-inflammatories.  If your pain is not going away or worsening, return to emergency department or see your doctor for recheck.

## 2024-06-24 NOTE — ED Triage Notes (Signed)
 Pt reports chest pain that began around 1245pm. States that she was evaluated by EMS and she was noted to be in NSR. Reports that she called her cardiologist and they told her to come to ED for labs and further evaluation. Hx of POTS. Reports some SOB when moving around.

## 2024-07-02 ENCOUNTER — Other Ambulatory Visit: Payer: Self-pay | Admitting: Family Medicine

## 2024-07-08 ENCOUNTER — Ambulatory Visit (INDEPENDENT_AMBULATORY_CARE_PROVIDER_SITE_OTHER): Admitting: Family Medicine

## 2024-07-08 ENCOUNTER — Encounter: Payer: Self-pay | Admitting: Family Medicine

## 2024-07-08 VITALS — BP 112/71 | HR 70 | Temp 97.0°F | Resp 18 | Wt 198.0 lb

## 2024-07-08 DIAGNOSIS — G90A Postural orthostatic tachycardia syndrome (POTS): Secondary | ICD-10-CM | POA: Diagnosis not present

## 2024-07-08 DIAGNOSIS — C50411 Malignant neoplasm of upper-outer quadrant of right female breast: Secondary | ICD-10-CM

## 2024-07-08 DIAGNOSIS — Z170 Estrogen receptor positive status [ER+]: Secondary | ICD-10-CM

## 2024-07-08 DIAGNOSIS — R599 Enlarged lymph nodes, unspecified: Secondary | ICD-10-CM | POA: Diagnosis not present

## 2024-07-08 DIAGNOSIS — K3184 Gastroparesis: Secondary | ICD-10-CM

## 2024-07-08 DIAGNOSIS — G901 Familial dysautonomia [Riley-Day]: Secondary | ICD-10-CM | POA: Insufficient documentation

## 2024-07-08 NOTE — Progress Notes (Signed)
 Assessment & Plan   Assessment/Plan:    Assessment & Plan Right chest/shoulder superficial mass (suspected lymph node) A palpable mass on the right chest near the shoulder, suspected to be an inflamed lymph node. The mass has increased in size over the past three days but is not painful. Differential diagnosis includes a lipoma or sebaceous cyst. Recent imaging did not show any concerning findings in the lymph nodes, but given her history of breast cancer, further evaluation is warranted. 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 Status post bilateral mastectomy with reconstruction. Recent imaging showed no enlarged mediastinal or axillary nodes.  Gastroparesis Gastroparesis likely related to dysautonomia. Symptoms include vomiting and abdominal pain. Previous Botox treatment provided temporary relief, but symptoms have returned, indicating the treatment may be wearing off. The specialist indicated that G-POEM has been successful in many patients, allowing her to return to normal life. The procedure involves clipping the pyloric sphincter, with a potential risk of dumping syndrome, which is considered manageable compared to current symptoms. - Continue follow-up with specialist in [place] on July 14, 2024 - Meet with surgeon on August 01, 2024, to discuss G-POEM procedure  Chronic abdominal pain Chronic abdominal pain associated with gastroparesis. Managed with tramadol  as needed for pain relief. - Consider re-prescribing tramadol  if needed, she to message Rosina for prescription renewal  Dysautonomia (including POTS) Dysautonomia with symptoms of dizziness and shortness of breath upon standing, possibly contributing to gastroparesis.  Recent episode of chest discomfort and elevated blood pressure may be related to POTS.      There are no discontinued medications.  No follow-ups on file.        Subjective:   Encounter date: 07/08/2024  Kaitlyn Johnson is a 43 y.o. 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; Palpitations; Abnormal LFTs; Encounter to establish care with new doctor; Low back pain; Intractable nausea and vomiting; Anxiety; Allergic reaction to bee sting; Arthropathy of lumbar facet joint; Bertolotti's syndrome; Chronic right shoulder pain; Gastroparesis; History of pancreatitis; History of hysterectomy; Mild intermittent asthma; Iron deficiency anemia; Insomnia; History of shingles; Hypocalcemia; Pre-op exam; History of Cushing's syndrome; Wrongful diagnosis of von Willebrand's disease; Acute non-recurrent maxillary sinusitis; S/P lumbar fusion; LUQ abdominal pain; Nausea and vomiting; RUQ abdominal pain; Gastrojejunal (GJ) tube in place [facility]); Chronic diarrhea; Diarrhea due to drug; Chronic abdominal pain; Malignant neoplasm of upper-outer quadrant of right breast in female, estrogen receptor positive (HCC); Family history of breast cancer; Family history of colon cancer; Breast cancer (HCC); Genetic testing; Status post bilateral mastectomy; SPINK1 gene mutation; and Monoallelic mutation of CFTR gene on their problem list..   She  has a past medical history of Acute appendicitis (02/28/2017), Acute pancreatitis (11/30/2013), Anemia (11/26/2011), Anxiety, Asthma, Breast cancer (HCC), Celiac and mesenteric artery injury, Cushing's syndrome (HCC), Depression, Family history of breast cancer, Family history of colon cancer, H/O hiatal hernia, History of kidney stones,  Iatrogenic Cushing's syndrome (HCC) (10/02/2013), Nausea vomiting and diarrhea (11/23/2013), Nausea with vomiting (11/30/2013), Palpitations (12/05/2018), Pancreatitis (11/29/2013), POTS (postural orthostatic tachycardia syndrome), Shortness of breath dyspnea, and Sphincter of Oddi dysfunction..   She presents with chief complaint of Adenopathy (Pt  c/o of swollen lymph nodes on right side chest near shoulder for 3 day; no pain is present) .   Discussed the use of AI scribe software for clinical note transcription with the patient, who gave verbal consent to proceed.  History of Present Illness Kaitlyn Johnson is a 43 year old female with a history of breast cancer who presents with concern of a right-sided lymph node near her shoulder.  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) - Status post right mastectomy with reconstruction - Palpable mass previously noted in axillary scar - Breast ultrasound previously ordered - Status post bilateral mastectomy with benign tissue on the left side - Adjuvant tamoxifen  therapy completed  Recent chest pain and cardiovascular symptoms - Recent CT angiogram for chest pain showed no enlarged mediastinal or axillary nodes and no significant findings - Diagnosed with pleuritic chest pain during recent ER visit - Blood pressure elevated at 172/112 for three hours during ER visit - Dizziness and shortness of breath upon standing, raising concern for postural orthostatic tachycardia syndrome (POTS) episode  Gastroparesis and gastrointestinal  symptoms - History of gastroparesis confirmed by specialist - Previously treated with Botox with initial improvement, but symptoms have recurred - Vomiting approximately once per week - Intermittent abdominal pain managed with tramadol  as needed - Scheduled to meet with surgeon regarding G-POEM procedure  Hereditary pancreatitis and cancer surveillance - 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 - Regular monitoring for pancreatic cancer with MRI or endoscopy every six months - No recent episodes of pancreatitis  Dysautonomia - History of dysautonomia believed to affect gastric motility and contribute to gastroparesis symptoms     ROS  Past Surgical History:  Procedure Laterality Date   ABDOMINAL HYSTERECTOMY  08/2010   ANTERIOR CERVICAL DECOMP/DISCECTOMY FUSION N/A 03/31/2013   Procedure: ANTERIOR CERVICAL DECOMPRESSION/DISCECTOMY FUSION 1 LEVEL Cervical five-six;  Surgeon: Darina MALVA Boehringer, MD;  Location: [facility];  Service: Neurosurgery;  Laterality: N/A;   APPENDECTOMY     BACK SURGERY     BREAST BIOPSY Right 11/12/2023   US  RT BREAST BX W LOC DEV 1ST LESION IMG BX SPEC US  GUIDE 11/12/2023 [facility]   BREAST RECONSTRUCTION WITH PLACEMENT OF TISSUE EXPANDER AND FLEX HD (ACELLULAR HYDRATED DERMIS) Bilateral 12/02/2023   Procedure: BILATERAL BREAST RECONSTRUCTION WITH PLACEMENT OF TISSUE EXPANDER AND FLEX HD (ACELLULAR HYDRATED DERMIS);  Surgeon: Elisabeth Craig RAMAN, MD;  Location: [facility];  Service: Plastics;  Laterality: Bilateral;   Cholangio-Pancreatography with Spintectorotomy + Stent  07/16/2012   01/15/14   CHOLECYSTECTOMY     ESOPHAGOGASTRODUODENOSCOPY (EGD) WITH PROPOFOL  N/A 08/19/2018   Procedure: ESOPHAGOGASTRODUODENOSCOPY (EGD) WITH PROPOFOL ;  Surgeon: Dianna Specking, MD;  Location: [facility];  Service: Endoscopy;  Laterality: N/A;   LAPAROSCOPIC APPENDECTOMY N/A 02/28/2017   Procedure: APPENDECTOMY LAPAROSCOPIC;  Surgeon:  Vanderbilt Ned, MD;  Location: [facility];  Service: General;  Laterality: N/A;   LAPAROSCOPIC ENDOMETRIOSIS FULGURATION     LAPAROSCOPIC GASTRIC SLEEVE RESECTION N/A 07/15/2018   Procedure: LAPAROSCOPIC GASTRIC SLEEVE RESECTION, UPPER ENDO, ERAS Pathway;  Surgeon: Gladis Cough, MD;  Location: [facility];  Service: General;  Laterality: N/A;   LUMBAR LAMINECTOMY     LUMBAR LAMINECTOMY/DECOMPRESSION MICRODISCECTOMY Left 06/22/2016   Procedure: MICRODISCECTOMY LEFT LUMBAR FOUR-FIVE;  Surgeon: Gerldine Maizes, MD;  Location: [facility];  Service: Neurosurgery;  Laterality: Left;   REDUCTION MAMMAPLASTY Bilateral    ROUX-EN-Y PROCEDURE  04/1999   SIMPLE MASTECTOMY WITH AXILLARY SENTINEL NODE BIOPSY Bilateral 12/02/2023   Procedure: BILATERAL MASTECTOMY WITH RIGHT AXILLARY SENTINEL NODE BIOPSY;  Surgeon: Vernetta Berg, MD;  Location: [facility];  Service: General;  Laterality: Bilateral;  LMA PEC BLOCK    Outpatient Medications Prior to Visit  Medication Sig Dispense Refill   ABILIFY  MAINTENA 400 MG SRER injection Inject 400 mg into the muscle every 28 (twenty-eight) days.     acebutolol  (SECTRAL ) 200 MG capsule TAKE ONE CAPSULE BY MOUTH TWICE DAILY 180 capsule 3   albuterol  (VENTOLIN  HFA) 108 (90 Base) MCG/ACT inhaler Inhale 1 puff into the lungs as needed for wheezing or shortness of breath.     AMBIEN  CR 12.5 MG CR tablet Take 12.5 mg by mouth at bedtime.  1   ARIPiprazole  ER (ABILIFY  MAINTENA) 400 MG PRSY prefilled syringe Inject 400 mg into the muscle every 28 (twenty-eight) days. 3 each 1   busPIRone  (BUSPAR ) 10 MG tablet Take 20 mg by mouth 3 (three) times daily.     clonazePAM  (KLONOPIN ) 0.5 MG tablet Take 0.5 mg by mouth 2 (two) times daily as needed for anxiety.     cyanocobalamin  (VITAMIN B12) 1000 MCG/ML injection Inject 1,000 mcg into the skin every 30 (thirty) days.     desipramine  (NORPRAMIN ) 25 MG tablet Take 25 mg by mouth at bedtime.     DEXILANT 30 MG capsule  Take 30 mg by mouth 2 (two) times daily.     dicyclomine  (BENTYL ) 10 MG capsule Take 1 capsule (10 mg total) by mouth 3 (three) times daily as needed for spasms (Pain/ spasms).     Difluprednate 0.05 % EMUL Place 1 drop into the right eye 4 (four) times daily.     diltiazem  (CARDIZEM  CD) 180 MG 24 hr capsule Take 180 mg by mouth daily.     diphenoxylate -atropine  (LOMOTIL ) 2.5-0.025 MG tablet Take 1 tablet by mouth 4 (four) times daily as needed for diarrhea or loose stools.     EPINEPHrine  0.3 mg/0.3 mL IJ SOAJ injection Inject 0.3 mg into the muscle as needed for anaphylaxis. 1 each 3   HYDROcodone -acetaminophen  (NORCO) 7.5-325 MG tablet Take 1 tablet by mouth every 6 (six) hours as needed.     ipratropium (ATROVENT ) 0.03 % nasal spray Place 2 sprays into both nostrils every 12 (twelve) hours. 30 mL 12   ivabradine  (CORLANOR ) 7.5 MG TABS tablet Take 1 tablet (7.5 mg total) by mouth 2 (two) times daily with a meal. 180 tablet 3   lamoTRIgine  (LAMICTAL ) 100 MG tablet Take 100 mg by mouth at bedtime.     levalbuterol  (XOPENEX  HFA) 45 MCG/ACT inhaler Inhale 1 puff into the lungs as needed for wheezing or shortness of breath.     levalbuterol  (XOPENEX ) 0.63 MG/3ML nebulizer solution Take 0.63 mg by nebulization every 6 (six) hours as needed for wheezing or shortness of breath.     LOTEMAX SM 0.38 % GEL Place 1 drop into the right eye every 2 (two) hours.     methocarbamol  (ROBAXIN ) 750 MG tablet Take 750 mg by mouth 4 (four) times daily as needed.     montelukast  (SINGULAIR ) 10 MG tablet Take  1 tablet (10 mg total) by mouth at bedtime. 30 tablet 3   ondansetron  (ZOFRAN ) 8 MG tablet Take 8 mg by mouth every 8 (eight) hours as needed.     ondansetron  (ZOFRAN -ODT) 4 MG disintegrating tablet DISSOLVE ONE TABLET BY MOUTH EVERY 8 HOURS AS NEEDED REFRACTORY NAUSEA/VOMITING 30 tablet 0   promethazine  (PHENERGAN ) 25 MG suppository Place 25 mg rectally.     tamoxifen  (NOLVADEX ) 20 MG tablet Take 1 tablet (20 mg  total) by mouth daily. 90 tablet 3   traMADol  (ULTRAM ) 50 MG tablet Take 100 mg by mouth every 8 (eight) hours as needed.     traZODone  (DESYREL ) 100 MG tablet Take 100-300 mg by mouth at bedtime as needed for sleep.     acetaminophen  (TYLENOL ) 500 MG tablet Take 2 tablets (1,000 mg total) by mouth every 8 (eight) hours. (Patient not taking: Reported on 07/08/2024) 30 tablet 0   No facility-administered medications prior to visit.    Family History  Problem Relation Age of Onset   Hypertension Mother    Thyroid  cancer Maternal Uncle        dx 82s   Stroke Paternal Aunt    Colon cancer Maternal Grandmother        dx 76s   Heart attack Maternal Grandfather    Aortic aneurysm Paternal Grandfather    Breast cancer Other        MGMs sister, d. 73s    Social History   Socioeconomic History   Marital status: Married    Spouse name: Not on file   Number of children: 2   Years of education: Not on file   Highest education level: Associate degree: academic program  Occupational History   Not on file  Tobacco Use   Smoking status: Never    Passive exposure: Never   Smokeless tobacco: Never  Vaping Use   Vaping status: Never Used  Substance and Sexual Activity   Alcohol use: Yes    Alcohol/week: 5.0 standard drinks of alcohol    Types: 5 Glasses of wine per week    Comment: OCC   Drug use: No   Sexual activity: Yes    Partners: Male    Birth control/protection: Surgical  Other Topics Concern   Not on file  Social History Narrative   Regular exercise: can't at this time   Caffeine use: no   Social Drivers of Corporate investment banker Strain: Low Risk  (10/01/2023)   Overall Financial Resource Strain (CARDIA)    Difficulty of Paying Living Expenses: Not hard at all  Food Insecurity: No Food Insecurity (10/01/2023)   Hunger Vital Sign    Worried About Running Out of Food in the Last Year: Never true    Ran Out of Food in the Last Year: Never true  Transportation Needs:  No Transportation Needs (10/01/2023)   PRAPARE - Administrator, Civil Service (Medical): No    Lack of Transportation (Non-Medical): No  Physical Activity: Unknown (10/01/2023)   Exercise Vital Sign    Days of Exercise per Week: 0 days    Minutes of Exercise per Session: Not on file  Stress: No Stress Concern Present (10/01/2023)   [employer] of Occupational Health - Occupational Stress Questionnaire    Feeling of Stress : Not at all  Social Connections: Moderately Integrated (10/01/2023)   Social Connection and Isolation Panel    Frequency of Communication with Friends and Family: More than three times a week  Frequency of Social Gatherings with Friends and Family: Twice a week    Attends Religious Services: More than 4 times per year    Active Member of [employer] or Organizations: No    Attends Banker Meetings: Not on file    Marital Status: Married  Catering manager Violence: Not At Risk (07/17/2023)   Humiliation, Afraid, Rape, and Kick questionnaire    Fear of Current or Ex-Partner: No    Emotionally Abused: No    Physically Abused: No    Sexually Abused: No                                                                                                  Objective:  Physical Exam: BP 112/71 (BP Location: Left Arm, Patient Position: Sitting, Cuff Size: Large)   Pulse 70   Temp (!) 97 F (36.1 C) (Temporal)   Resp 18   Wt 198 lb (89.8 kg)   SpO2 100%   BMI 29.24 kg/m   Wt Readings from Last 3 Encounters:  07/08/24 198 lb (89.8 kg)  06/24/24 193 lb (87.5 kg)  05/26/24 206 lb (93.4 kg)    Physical Exam GENERAL: Alert, cooperative, well developed, no acute distress. HEENT: Normocephalic, normal oropharynx, moist mucous membranes. CHEST: Clear to auscultation bilaterally, no wheezes, rhonchi, or crackles. CARDIOVASCULAR: Normal heart rate and rhythm, S1 and S2 normal without murmurs. LYMPH: No palpable lymphadenopathy in right axilla  supraclavicular or cervical. Palpable mass in right chest near shoulder, non-tender. ABDOMEN: Soft, non-tender, non-distended, without organomegaly, normal bowel sounds. EXTREMITIES: No cyanosis or edema. NEUROLOGICAL: Cranial nerves grossly intact, moves all extremities without gross motor or sensory deficit.   Physical Exam  CT Angio Chest PE W/Cm &/Or Wo Cm Result Date: 06/24/2024 CLINICAL DATA:  Pulmonary embolus suspected with high probability. EXAM: CT ANGIOGRAPHY CHEST WITH CONTRAST TECHNIQUE: Multidetector CT imaging of the chest was performed using the standard protocol during bolus administration of intravenous contrast. Multiplanar CT image reconstructions and MIPs were obtained to evaluate the vascular anatomy. RADIATION DOSE REDUCTION: This exam was performed according to the departmental dose-optimization program which includes automated exposure control, adjustment of the mA and/or kV according to patient size and/or use of iterative reconstruction technique. CONTRAST:  80mL OMNIPAQUE  IOHEXOL  350 MG/ML SOLN COMPARISON:  Chest radiograph 06/24/2024.  CT chest 07/06/2022 FINDINGS: Cardiovascular: Technically adequate study with good opacification of the central and segmental pulmonary arteries. Moderate motion artifact. No focal filling defects. No evidence of significant pulmonary embolus. Normal heart size. No pericardial effusions. Normal caliber thoracic aorta. Great vessel origins are patent. Mediastinum/Nodes: No enlarged mediastinal, hilar, or axillary lymph nodes. Thyroid  gland, trachea, and esophagus demonstrate no significant findings. Lungs/Pleura: Lungs are clear. No pleural effusion or pneumothorax. Upper Abdomen: Bilateral breast implants. Surgical absence of the gallbladder. Postoperative changes consistent with gastric sleeve procedure. No acute abnormalities. Musculoskeletal: Postoperative changes in the cervical spine. No acute bony abnormalities. Review of the MIP images  confirms the above findings. IMPRESSION: 1. No evidence of significant pulmonary embolus. 2. No evidence of active pulmonary disease. Electronically Signed   By: Elsie  Mannie M.D.   On: 06/24/2024 17:28   DG Chest 2 View Result Date: 06/24/2024 CLINICAL DATA:  Chest pain. EXAM: CHEST - 2 VIEW COMPARISON:  10/24/2022. FINDINGS: Bilateral lung fields are clear. Bilateral costophrenic angles are clear. Normal cardio-mediastinal silhouette. No acute osseous abnormalities. Lower cervical spinal fixation hardware noted. The soft tissues are within normal limits. There are surgical clips in the right upper quadrant, typical of a previous cholecystectomy. IMPRESSION: No active cardiopulmonary disease. Electronically Signed   By: Ree Molt M.D.   On: 06/24/2024 15:51   US  AXILLA RIGHT Result Date: 05/28/2024 CLINICAL DATA:  43 year old female presents with palpable thickening in the RIGHT axilla. History of RIGHT breast cancer and bilateral mastectomies in 2025. EXAM: ULTRASOUND OF THE RIGHT AXILLA COMPARISON:  Prior studies FINDINGS: Ultrasound is performed, showing no sonographic abnormalities within the RIGHT axilla. No abnormal lymph nodes are noted. IMPRESSION: No sonographic abnormalities within the RIGHT axilla. No abnormal appearing RIGHT axillary lymph nodes. RECOMMENDATION: Recommend clinical follow-up as indicated. Any further workup should be based on clinical grounds. I have discussed the findings and recommendations with the patient. If applicable, a reminder letter will be sent to the patient regarding the next appointment. BI-RADS CATEGORY  1: Negative. Electronically Signed   By: Reyes Phi M.D.   On: 05/28/2024 13:27   DG UGI W SMALL BOWEL Result Date: 04/21/2024 CLINICAL DATA:  REGURGITATION OF FOOD. EXAM: UPPER GI SERIES WITH SMALL BOWEL FOLLOW-THROUGH WITH KUB. FLUOROSCOPY: Radiation Exposure Index (as provided by the fluoroscopic device): 42.4 mGy Kerma TECHNIQUE: Combined double  contrast and single contrast upper GI series using effervescent crystals, thick barium, and thin barium. Subsequently, serial images of the small bowel were obtained including spot views of the terminal ileum. COMPARISON:  07/09/2019. FINDINGS: Scout: Visualized bilateral lungs are clear. Bilateral lateral costophrenic angles are clear. No free air under the domes of diaphragm. There are surgical clips in the right upper quadrant, typical of a previous cholecystectomy. Esophageal motility is normal. There is no stricture, ring or web. No hiatal hernia. Gastric distension is adequate. Postsurgical changes from prior gastric sleeve surgery noted. Evaluation of gastric folds is limited. However, no large erosion or ulcer noted. A gastrojejunostomy tube is noted. Duodenal bulb distension is adequate. There are no erosions or ulcers. The duodenal-jejunal junction is normal. No spontaneous gastro-esophageal reflux. Follow-through of the small bowel shows normal transit of barium through the jejunum and ileum into the right side of the colon without evidence of obstruction. Appendix was not opacified. There are no findings of Crohn's disease in the terminal ileum or elsewhere in the small bowel. No fixed polypoid, ulcerated, or annular lesions are seen in the small bowel. The small bowel transit time: 60 minutes. IMPRESSION: 1. Postsurgical changes from prior gastric sleeve surgery. 2. Normal small bowel transit time. 3. Otherwise unremarkable exam. Electronically Signed   By: Ree Molt M.D.   On: 04/21/2024 17:26    Recent Results (from the past 2160 hours)  Basic metabolic panel     Status: None   Collection Time: 06/24/24  3:26 PM  Result Value Ref Range   Sodium 139 135 - 145 mmol/L   Potassium 4.1 3.5 - 5.1 mmol/L   Chloride 103 98 - 111 mmol/L   CO2 25 22 - 32 mmol/L   Glucose, Bld 97 70 - 99 mg/dL    Comment: Glucose reference range applies only to samples taken after fasting for at least 8 hours.    BUN 11  6 - 20 mg/dL   Creatinine, Ser 9.03 0.44 - 1.00 mg/dL   Calcium  9.0 8.9 - 10.3 mg/dL   GFR, Estimated >39 >39 mL/min    Comment: (NOTE) Calculated using the CKD-EPI Creatinine Equation (2021)    Anion gap 11 5 - 15    Comment: Performed at [facility], [address] [place], [place] [address]  CBC     Status: Abnormal   Collection Time: 06/24/24  3:26 PM  Result Value Ref Range   WBC 6.3 4.0 - 10.5 K/uL   RBC 3.96 3.87 - 5.11 MIL/uL   Hemoglobin 11.7 (L) 12.0 - 15.0 g/dL   HCT 64.2 (L) 63.9 - 53.9 %   MCV 90.2 80.0 - 100.0 fL   MCH 29.5 26.0 - 34.0 pg   MCHC 32.8 30.0 - 36.0 g/dL   RDW 87.1 88.4 - 84.4 %   Platelets 242 150 - 400 K/uL   nRBC 0.0 0.0 - 0.2 %    Comment: Performed at [facility], [address] [place], [place] [address]  Troponin T, High Sensitivity     Status: None   Collection Time: 06/24/24  3:26 PM  Result Value Ref Range   Troponin T High Sensitivity <15 0 - 19 ng/L    Comment: (NOTE) Biotin concentrations > 1000 ng/mL falsely decrease TnT results.  Serial cardiac troponin measurements are suggested.  Refer to the Links section for chest pain algorithms and additional  guidance. Performed at [facility], [address], [place], [place] [address]         Beverley Adine Hummer, MD, MS

## 2024-07-13 ENCOUNTER — Ambulatory Visit
Admission: RE | Admit: 2024-07-13 | Discharge: 2024-07-13 | Disposition: A | Discharge status: Home / Self Care | Source: Ambulatory Visit | Attending: Family Medicine | Admitting: Family Medicine

## 2024-07-13 DIAGNOSIS — R222 Localized swelling, mass and lump, trunk: Secondary | ICD-10-CM | POA: Diagnosis not present

## 2024-07-13 DIAGNOSIS — R599 Enlarged lymph nodes, unspecified: Secondary | ICD-10-CM

## 2024-07-14 ENCOUNTER — Other Ambulatory Visit: Payer: Self-pay | Admitting: Family Medicine

## 2024-07-14 DIAGNOSIS — N6311 Unspecified lump in the right breast, upper outer quadrant: Secondary | ICD-10-CM | POA: Insufficient documentation

## 2024-07-14 DIAGNOSIS — R1110 Vomiting, unspecified: Secondary | ICD-10-CM | POA: Diagnosis not present

## 2024-07-14 DIAGNOSIS — K3184 Gastroparesis: Secondary | ICD-10-CM | POA: Diagnosis not present

## 2024-07-14 DIAGNOSIS — Z9884 Bariatric surgery status: Secondary | ICD-10-CM | POA: Diagnosis not present

## 2024-07-14 NOTE — Progress Notes (Unsigned)
 Cystic breast mass near prior surgical site of mastectomy for breast cancer. Diagnostic mammogram is recommended. I will order urgently.

## 2024-07-15 NOTE — Progress Notes (Signed)
 Called [facility] and patient was scheduled for 07/16/2024 @ 12:40pm. Called patient and informed; she verbally understanding

## 2024-07-16 ENCOUNTER — Encounter: Payer: Self-pay | Admitting: Family Medicine

## 2024-07-16 ENCOUNTER — Ambulatory Visit
Admission: RE | Admit: 2024-07-16 | Discharge: 2024-07-16 | Disposition: A | Discharge status: Home / Self Care | Source: Ambulatory Visit | Attending: Family Medicine | Admitting: Family Medicine

## 2024-07-16 ENCOUNTER — Other Ambulatory Visit: Payer: Self-pay | Admitting: Family Medicine

## 2024-07-16 DIAGNOSIS — N6001 Solitary cyst of right breast: Secondary | ICD-10-CM

## 2024-07-16 DIAGNOSIS — N6311 Unspecified lump in the right breast, upper outer quadrant: Secondary | ICD-10-CM

## 2024-07-22 DIAGNOSIS — H40013 Open angle with borderline findings, low risk, bilateral: Secondary | ICD-10-CM | POA: Diagnosis not present

## 2024-07-24 ENCOUNTER — Other Ambulatory Visit: Payer: Self-pay | Admitting: Family Medicine

## 2024-07-24 DIAGNOSIS — M25571 Pain in right ankle and joints of right foot: Secondary | ICD-10-CM | POA: Diagnosis not present

## 2024-07-24 DIAGNOSIS — R109 Unspecified abdominal pain: Secondary | ICD-10-CM

## 2024-07-24 NOTE — Telephone Encounter (Signed)
 Pt requesting refill for traMADol  (ULTRAM ) 50 MG tablet   LOV  07/08/24 FOV not scheduled  LRF  06/10/24

## 2024-07-30 DIAGNOSIS — R112 Nausea with vomiting, unspecified: Secondary | ICD-10-CM | POA: Diagnosis not present

## 2024-07-30 DIAGNOSIS — K859 Acute pancreatitis without necrosis or infection, unspecified: Secondary | ICD-10-CM | POA: Diagnosis not present

## 2024-07-30 DIAGNOSIS — K3184 Gastroparesis: Secondary | ICD-10-CM | POA: Diagnosis not present

## 2024-07-30 DIAGNOSIS — G901 Familial dysautonomia [Riley-Day]: Secondary | ICD-10-CM | POA: Diagnosis not present

## 2024-07-30 DIAGNOSIS — M47816 Spondylosis without myelopathy or radiculopathy, lumbar region: Secondary | ICD-10-CM | POA: Diagnosis not present

## 2024-07-30 DIAGNOSIS — G90A Postural orthostatic tachycardia syndrome (POTS): Secondary | ICD-10-CM | POA: Diagnosis not present

## 2024-07-30 DIAGNOSIS — Z981 Arthrodesis status: Secondary | ICD-10-CM | POA: Diagnosis not present

## 2024-07-30 DIAGNOSIS — Z934 Other artificial openings of gastrointestinal tract status: Secondary | ICD-10-CM | POA: Diagnosis not present

## 2024-07-31 DIAGNOSIS — Z9884 Bariatric surgery status: Secondary | ICD-10-CM | POA: Diagnosis not present

## 2024-07-31 DIAGNOSIS — K3184 Gastroparesis: Secondary | ICD-10-CM | POA: Diagnosis not present

## 2024-08-05 DIAGNOSIS — K861 Other chronic pancreatitis: Secondary | ICD-10-CM | POA: Diagnosis not present

## 2024-08-05 DIAGNOSIS — Z1589 Genetic susceptibility to other disease: Secondary | ICD-10-CM | POA: Diagnosis not present

## 2024-08-05 DIAGNOSIS — Z9189 Other specified personal risk factors, not elsewhere classified: Secondary | ICD-10-CM | POA: Diagnosis not present

## 2024-08-10 ENCOUNTER — Other Ambulatory Visit: Payer: Self-pay | Admitting: Cardiology

## 2024-08-12 DIAGNOSIS — R1013 Epigastric pain: Secondary | ICD-10-CM | POA: Diagnosis not present

## 2024-08-12 DIAGNOSIS — R1084 Generalized abdominal pain: Secondary | ICD-10-CM | POA: Diagnosis not present

## 2024-08-12 DIAGNOSIS — R109 Unspecified abdominal pain: Secondary | ICD-10-CM | POA: Diagnosis not present

## 2024-08-12 DIAGNOSIS — K3184 Gastroparesis: Secondary | ICD-10-CM | POA: Diagnosis not present

## 2024-08-12 DIAGNOSIS — R112 Nausea with vomiting, unspecified: Secondary | ICD-10-CM | POA: Diagnosis not present

## 2024-08-13 ENCOUNTER — Encounter: Payer: Self-pay | Admitting: Cardiology

## 2024-08-17 DIAGNOSIS — R1013 Epigastric pain: Secondary | ICD-10-CM | POA: Diagnosis not present

## 2024-09-03 DIAGNOSIS — Z9884 Bariatric surgery status: Secondary | ICD-10-CM | POA: Diagnosis not present

## 2024-09-11 ENCOUNTER — Encounter: Payer: Self-pay | Admitting: Cardiology

## 2024-09-13 ENCOUNTER — Encounter: Payer: Self-pay | Admitting: Cardiology

## 2024-09-13 NOTE — Telephone Encounter (Signed)
 Letter to the surgeon has been sent and can be seen in letter section of epic

## 2024-09-14 DIAGNOSIS — E1143 Type 2 diabetes mellitus with diabetic autonomic (poly)neuropathy: Secondary | ICD-10-CM | POA: Diagnosis not present

## 2024-09-14 DIAGNOSIS — Z9884 Bariatric surgery status: Secondary | ICD-10-CM | POA: Diagnosis not present

## 2024-09-14 DIAGNOSIS — E860 Dehydration: Secondary | ICD-10-CM | POA: Diagnosis not present

## 2024-09-14 DIAGNOSIS — K3184 Gastroparesis: Secondary | ICD-10-CM | POA: Diagnosis not present

## 2024-09-14 DIAGNOSIS — R1012 Left upper quadrant pain: Secondary | ICD-10-CM | POA: Diagnosis not present

## 2024-09-14 DIAGNOSIS — R1110 Vomiting, unspecified: Secondary | ICD-10-CM | POA: Diagnosis not present

## 2024-09-14 LAB — COMPREHENSIVE METABOLIC PANEL WITH GFR: EGFR: 88

## 2024-09-15 DIAGNOSIS — R9431 Abnormal electrocardiogram [ECG] [EKG]: Secondary | ICD-10-CM | POA: Diagnosis not present

## 2024-09-28 DIAGNOSIS — F325 Major depressive disorder, single episode, in full remission: Secondary | ICD-10-CM | POA: Diagnosis not present

## 2024-09-28 DIAGNOSIS — K3189 Other diseases of stomach and duodenum: Secondary | ICD-10-CM | POA: Diagnosis not present

## 2024-09-29 ENCOUNTER — Encounter: Payer: Self-pay | Admitting: Family Medicine

## 2024-09-29 DIAGNOSIS — G901 Familial dysautonomia [Riley-Day]: Secondary | ICD-10-CM | POA: Diagnosis not present

## 2024-09-30 ENCOUNTER — Other Ambulatory Visit: Payer: Self-pay | Admitting: Internal Medicine

## 2024-09-30 ENCOUNTER — Ambulatory Visit: Payer: Self-pay

## 2024-09-30 ENCOUNTER — Other Ambulatory Visit: Payer: Self-pay | Admitting: Family Medicine

## 2024-09-30 DIAGNOSIS — G8929 Other chronic pain: Secondary | ICD-10-CM

## 2024-09-30 MED ORDER — OXYCODONE HCL 5 MG PO TABS: 5.0000 mg | ORAL_TABLET | ORAL | 0 refills | Status: DC | PRN

## 2024-09-30 MED ORDER — OXYCODONE HCL 5 MG PO TABS: 5.0000 mg | ORAL_TABLET | ORAL | 0 refills | Status: AC | PRN

## 2024-09-30 NOTE — Progress Notes (Unsigned)
 Discontinued tramadol . Sent oxycodone  alternative. Please have patient follow up within a week to discuss alternative.

## 2024-09-30 NOTE — Addendum Note (Signed)
 Addended by: SEBASTIAN BEVERLEY NOVAK on: 09/30/2024 05:27 PM   Modules accepted: Orders

## 2024-09-30 NOTE — Telephone Encounter (Signed)
 Pt disconnected call prior to warm transfer. Per previous CRM routed to clinic, Dr. Sebastian staff aware and will be in contact with pt.   Copied from CRM #[id number]. Topic: Clinical - Red Word Triage >> Sep 30, 2024  4:50 PM Kaitlyn Johnson wrote: Patient is calling back to see if we would be able to connect with the clinic for an update on this. She is still in pain.

## 2024-09-30 NOTE — Telephone Encounter (Signed)
 FYI Only or Action Required?: Action required by provider: requesting stronger pain medication post GJ tube placement. Requesting a call back.  Patient was last seen in primary care on 07/08/2024 by Sebastian Beverley NOVAK, MD.  Called Nurse Triage reporting Abdominal Pain (Post GJ tube placement).  Symptoms began 09/28/2024.  Interventions attempted: Prescription medications: See MAR and Rest, hydration, or home remedies.  Symptoms are: unchanged.  Triage Disposition: See HCP Within 4 Hours (Or PCP Triage)  Patient/caregiver understands and will follow disposition?: No, wishes to speak with PCP  Copied from CRM #[id number]. Topic: Clinical - Red Word Triage >> Sep 30, 2024 10:21 AM Dedra B wrote: Red Word that prompted transfer to Nurse Triage: Pt in excruciating pain after having feeding tube placed. Warm transfer to NT. Reason for Disposition  [1] MILD-MODERATE pain AND [2] constant AND [3] present > 2 hours  Answer Assessment - Initial Assessment Questions Had a new GJ tube placed on 12/1-reports constant pain since procedure. Patient reports a NG tube had to be placed first to inflate her stomach to place the GJ tube, report some distension and redness to the area. Patient reports she has been able to give herself pushes of pedialyte and formula without any issue. No increase in nausea and reports no vomiting. No increase in pain with giving pushes through the tube. Patient states this is typical pain post tube placement. Did give ED precautions in regards to increases in symptoms. Patient is requesting something stronger for the next couple of days to get her over the hump. Patient is asking for a call back from the office.   1. LOCATION: Where does it hurt?      Patient had a GJ tube placed to mid abdominal area 2. RADIATION: Does the pain shoot anywhere else? (e.g., chest, back)     No radiation 3. ONSET: When did the pain begin? (e.g., minutes, hours or days ago)      Pain started  post procedure GJ tube placement 4. SUDDEN: Gradual or sudden onset?     Sudden once anesthia went away.  5. PATTERN Does the pain come and go, or is it constant?     constant 6. SEVERITY: How bad is the pain?  (e.g., Scale 1-10; mild, moderate, or severe)     7 out of 10 7. RECURRENT SYMPTOM: Have you ever had this type of stomach pain before? If Yes, ask: When was the last time? and What happened that time?      Yes-happened the last time GJ tube was placed 8. AGGRAVATING FACTORS: Does anything seem to cause this pain? (e.g., foods, stress, alcohol)     Post surgery 9. CARDIAC SYMPTOMS: Do you have any of the following symptoms: chest pain, difficulty breathing, sweating, nausea?     Nausea but this is her baseline 10. OTHER SYMPTOMS: Do you have any other symptoms? (e.g., back pain, diarrhea, fever, urination pain, vomiting)       no  Protocols used: Abdominal Pain - Upper-A-AH

## 2024-09-30 NOTE — Telephone Encounter (Signed)
 Pt is requesting pain meds had GJ tube placed. Patients state tramadol  and tylenol  aren't helping.

## 2024-09-30 NOTE — Telephone Encounter (Signed)
 FYI Only or Action Required?: Action required by provider: update on patient condition.  Patient was last seen in primary care on 07/08/2024 by Sebastian Beverley NOVAK, MD.  Called Nurse Triage reporting Abdominal Pain.  Symptoms began today.  Interventions attempted: Prescription medications: Tramadol ; tylenol .  Symptoms are: gradually worsening.  Triage Disposition: Call PCP Now  Patient/caregiver understands and will follow disposition?: Yes  Copied from CRM #[id number]. Topic: Clinical - Red Word Triage >> Sep 30, 2024  4:50 PM Kaitlyn Johnson: Patient is calling back to see if we would be able to connect with the clinic for an update on this. She is still in pain.  Dr [facility] [place]  Reason for Disposition  [1] Follow-up call from patient regarding patient's clinical status AND [2] information urgent  Answer Assessment - Initial Assessment Questions 1. REASON FOR CALL or QUESTION: What is your reason for calling today? or How can I best     Pt calling in to f/u on previous messages to Dr. Sebastian regarding pain medication for abdominal discomfort related to GJ tube placement. She states that she always has to have something stronger than tramadol  and tylenol  for the first few days until her body is used to foreign object. Attempted to contact [facility] d/t repeated pt calls but office closed. Per on call schedule, contacted Dr. Orin who stated Oxycodone  was sent to [facility]. I was not able to see this on my end but relayed to pt. She voiced appreciation. Encouraged pt to f/u with office tomorrow if pain was still present.  Protocols used: PCP Call - No Triage-A-AH

## 2024-10-01 ENCOUNTER — Other Ambulatory Visit: Payer: Self-pay | Admitting: *Deleted

## 2024-10-01 DIAGNOSIS — C50411 Malignant neoplasm of upper-outer quadrant of right female breast: Secondary | ICD-10-CM

## 2024-10-01 NOTE — Telephone Encounter (Signed)
 Called patient to get her scheduled for an office appointment for today. Patient stated that she called the office after hours spoke with a nurse who then put patient in contact with Dr. Theophilus who change her medication and she is now starting to feel a little bit better. I informed patient to give the office a call if the symptoms worsen, develop new or worsen symptoms or to go to the nearest urgent care and/or ED. She verbalized understanding and all (if any) questions were answered

## 2024-10-02 ENCOUNTER — Other Ambulatory Visit: Payer: Self-pay | Admitting: *Deleted

## 2024-10-02 ENCOUNTER — Inpatient Hospital Stay: Attending: Hematology and Oncology

## 2024-10-02 ENCOUNTER — Ambulatory Visit (HOSPITAL_COMMUNITY)
Admission: RE | Admit: 2024-10-02 | Discharge: 2024-10-02 | Disposition: A | Source: Ambulatory Visit | Attending: Hematology and Oncology

## 2024-10-02 ENCOUNTER — Inpatient Hospital Stay: Admitting: Hematology and Oncology

## 2024-10-02 ENCOUNTER — Telehealth: Payer: Self-pay | Admitting: *Deleted

## 2024-10-02 VITALS — BP 120/78 | HR 72 | Temp 97.6°F | Resp 18 | Ht 69.0 in | Wt 197.7 lb

## 2024-10-02 DIAGNOSIS — Z800 Family history of malignant neoplasm of digestive organs: Secondary | ICD-10-CM | POA: Insufficient documentation

## 2024-10-02 DIAGNOSIS — Z9013 Acquired absence of bilateral breasts and nipples: Secondary | ICD-10-CM | POA: Diagnosis not present

## 2024-10-02 DIAGNOSIS — Z931 Gastrostomy status: Secondary | ICD-10-CM

## 2024-10-02 DIAGNOSIS — R110 Nausea: Secondary | ICD-10-CM | POA: Diagnosis not present

## 2024-10-02 DIAGNOSIS — C50411 Malignant neoplasm of upper-outer quadrant of right female breast: Secondary | ICD-10-CM

## 2024-10-02 DIAGNOSIS — Z170 Estrogen receptor positive status [ER+]: Secondary | ICD-10-CM

## 2024-10-02 DIAGNOSIS — R140 Abdominal distension (gaseous): Secondary | ICD-10-CM | POA: Diagnosis not present

## 2024-10-02 DIAGNOSIS — Z79810 Long term (current) use of selective estrogen receptor modulators (SERMs): Secondary | ICD-10-CM | POA: Diagnosis not present

## 2024-10-02 DIAGNOSIS — Z79899 Other long term (current) drug therapy: Secondary | ICD-10-CM | POA: Insufficient documentation

## 2024-10-02 DIAGNOSIS — Z9049 Acquired absence of other specified parts of digestive tract: Secondary | ICD-10-CM | POA: Diagnosis not present

## 2024-10-02 DIAGNOSIS — Z808 Family history of malignant neoplasm of other organs or systems: Secondary | ICD-10-CM | POA: Insufficient documentation

## 2024-10-02 DIAGNOSIS — Z803 Family history of malignant neoplasm of breast: Secondary | ICD-10-CM | POA: Diagnosis not present

## 2024-10-02 LAB — CMP (CANCER CENTER ONLY)
ALT: 14 U/L (ref 0–44)
AST: 24 U/L (ref 15–41)
Albumin: 4.5 g/dL (ref 3.5–5.0)
Alkaline Phosphatase: 71 U/L (ref 38–126)
Anion gap: 10 (ref 5–15)
BUN: 8 mg/dL (ref 6–20)
CO2: 27 mmol/L (ref 22–32)
Calcium: 9.5 mg/dL (ref 8.9–10.3)
Chloride: 102 mmol/L (ref 98–111)
Creatinine: 0.78 mg/dL (ref 0.44–1.00)
GFR, Estimated: >60 mL/min (ref >60)
Glucose, Bld: 105 mg/dL — ABNORMAL HIGH (ref 70–99)
Potassium: 4.6 mmol/L (ref 3.5–5.1)
Sodium: 139 mmol/L (ref 135–145)
Total Bilirubin: 0.3 mg/dL (ref 0.0–1.2)
Total Protein: 8 g/dL (ref 6.5–8.1)

## 2024-10-02 LAB — PHOSPHORUS: Phosphorus: 3.5 mg/dL (ref 2.5–4.6)

## 2024-10-02 LAB — MAGNESIUM: Magnesium: 1.9 mg/dL (ref 1.7–2.4)

## 2024-10-02 MED ORDER — IOHEXOL 300 MG/ML  SOLN
50.0000 mL | Freq: Once | INTRAMUSCULAR | Status: AC | PRN
  Administered 2024-10-02: 50 mL

## 2024-10-02 NOTE — Progress Notes (Signed)
  Cancer Center CONSULT NOTE  Patient Care Team: Sebastian Beverley NOVAK, MD as PCP - General (Family Medicine) Ladona Heinz, MD as PCP - Cardiology (Cardiology) Loretha Ash, MD as Consulting Physician (Hematology and Oncology) Vernetta Berg, MD as Consulting Physician (General Surgery)  CHIEF COMPLAINTS/PURPOSE OF CONSULTATION:  New onset breast cancer  ASSESSMENT & PLAN:   Assessment and Plan Assessment & Plan  Gastrostomy/jejunostomy tube complication (possible migration, intolerance) Increased abdominal distension and nausea. Hypoactive bowel sounds noted. XR from today shows correct placement. Encouraged her to contact [facility] if she continues to have pain. She expressed understanding.  Malignant neoplasm of right breast, upper-outer quadrant Continues tamoxifen  without side effects.  - Continue tamoxifen  therapy. - Reassess implant position after stomach surgery.   HISTORY OF PRESENTING ILLNESS:   Discussed the use of AI scribe software for clinical note transcription with the patient, who gave verbal consent to proceed.  History of Present Illness  Kaitlyn Johnson is a 43 year old female who presents with abdominal distension and nausea related to her GJ tube.  She experiences significant abdominal distension and nausea, particularly when administering formula through her GJ tube. There is a sensation of impending emesis when using the J portion of the tube. This issue has occurred previously when the tube migrated from the jejunum to the stomach.  Her GJ tube was placed previously and has migrated in the past, according to her recollection of prior imaging and procedures. She is concerned about the current placement and seeks confirmation through imaging.  She is currently taking tamoxifen  and reports no side effects, stating she is accustomed to it.  She mentions that her breast implant has flipped, but this is not a primary concern at this time, as  she is focusing on resolving her stomach issues first. She is scheduled for stomach surgery in January. Rest of the pertinent 10 point ROS reviewed and neg.    MEDICAL HISTORY:  Past Medical History:  Diagnosis Date   Acute appendicitis 02/28/2017   Acute pancreatitis 11/30/2013   Anemia 11/26/2011   Anxiety    Asthma    Breast cancer (HCC)    2025 early this year   Celiac and mesenteric artery injury    Cushing's syndrome    06/21/16- in remission   Depression    Family history of breast cancer    Family history of colon cancer    H/O hiatal hernia    History of kidney stones    Iatrogenic Cushing's syndrome 10/02/2013   Nausea vomiting and diarrhea 11/23/2013   Nausea with vomiting 11/30/2013   Palpitations 12/05/2018   Pancreatitis 11/29/2013   POTS (postural orthostatic tachycardia syndrome)    Shortness of breath dyspnea    with exertertion   Sphincter of Oddi dysfunction     SURGICAL HISTORY: Past Surgical History:  Procedure Laterality Date   ABDOMINAL HYSTERECTOMY  08/2010   ANTERIOR CERVICAL DECOMP/DISCECTOMY FUSION N/A 03/31/2013   Procedure: ANTERIOR CERVICAL DECOMPRESSION/DISCECTOMY FUSION 1 LEVEL Cervical five-six;  Surgeon: Darina MALVA Boehringer, MD;  Location: [facility];  Service: Neurosurgery;  Laterality: N/A;   APPENDECTOMY     BACK SURGERY     BREAST BIOPSY Right 11/12/2023   US  RT BREAST BX W LOC DEV 1ST LESION IMG BX SPEC US  GUIDE 11/12/2023 [facility]   BREAST RECONSTRUCTION WITH PLACEMENT OF TISSUE EXPANDER AND FLEX HD (ACELLULAR HYDRATED DERMIS) Bilateral 12/02/2023   Procedure: BILATERAL BREAST RECONSTRUCTION WITH PLACEMENT OF TISSUE EXPANDER AND FLEX HD (ACELLULAR  HYDRATED DERMIS);  Surgeon: Elisabeth Craig RAMAN, MD;  Location: [facility];  Service: Plastics;  Laterality: Bilateral;   Cholangio-Pancreatography with Spintectorotomy + Stent  07/16/2012   01/15/14   CHOLECYSTECTOMY     ESOPHAGOGASTRODUODENOSCOPY (EGD) WITH PROPOFOL   N/A 08/19/2018   Procedure: ESOPHAGOGASTRODUODENOSCOPY (EGD) WITH PROPOFOL ;  Surgeon: Dianna Specking, MD;  Location: [facility];  Service: Endoscopy;  Laterality: N/A;   LAPAROSCOPIC APPENDECTOMY N/A 02/28/2017   Procedure: APPENDECTOMY LAPAROSCOPIC;  Surgeon: Vanderbilt Ned, MD;  Location: [facility];  Service: General;  Laterality: N/A;   LAPAROSCOPIC ENDOMETRIOSIS FULGURATION     LAPAROSCOPIC GASTRIC SLEEVE RESECTION N/A 07/15/2018   Procedure: LAPAROSCOPIC GASTRIC SLEEVE RESECTION, UPPER ENDO, ERAS Pathway;  Surgeon: Gladis Cough, MD;  Location: [facility];  Service: General;  Laterality: N/A;   LUMBAR LAMINECTOMY     LUMBAR LAMINECTOMY/DECOMPRESSION MICRODISCECTOMY Left 06/22/2016   Procedure: MICRODISCECTOMY LEFT LUMBAR FOUR-FIVE;  Surgeon: Gerldine Maizes, MD;  Location: [facility];  Service: Neurosurgery;  Laterality: Left;   REDUCTION MAMMAPLASTY Bilateral    ROUX-EN-Y PROCEDURE  04/1999   SIMPLE MASTECTOMY WITH AXILLARY SENTINEL NODE BIOPSY Bilateral 12/02/2023   Procedure: BILATERAL MASTECTOMY WITH RIGHT AXILLARY SENTINEL NODE BIOPSY;  Surgeon: Vernetta Berg, MD;  Location: [facility];  Service: General;  Laterality: Bilateral;  LMA PEC BLOCK    SOCIAL HISTORY: Social History   Socioeconomic History   Marital status: Married    Spouse name: Not on file   Number of children: 2   Years of education: Not on file   Highest education level: Associate degree: academic program  Occupational History   Not on file  Tobacco Use   Smoking status: Never    Passive exposure: Never   Smokeless tobacco: Never  Vaping Use   Vaping status: Never Used  Substance and Sexual Activity   Alcohol use: Yes    Alcohol/week: 5.0 standard drinks of alcohol    Types: 5 Glasses of wine per week    Comment: OCC   Drug use: No   Sexual activity: Yes    Partners: Male    Birth control/protection: Surgical  Other Topics Concern   Not on file  Social History Narrative    Regular exercise: can't at this time   Caffeine use: no   Social Drivers of Corporate Investment Banker Strain: Low Risk  (10/01/2023)   Overall Financial Resource Strain (CARDIA)    Difficulty of Paying Living Expenses: Not hard at all  Food Insecurity: Low Risk  (09/14/2024)   Received from [facility]   Hunger Vital Sign    Within the past 12 months, you worried that your food would run out before you got money to buy more: Never true    Within the past 12 months, the food you bought just didn't last and you didn't have money to get more. : Never true  Transportation Needs: No Transportation Needs (09/14/2024)   Received from [facility]    In the past 12 months, has lack of reliable transportation kept you from medical appointments, meetings, work or from getting things needed for daily living? : No  Physical Activity: Unknown (10/01/2023)   Exercise Vital Sign    Days of Exercise per Week: 0 days    Minutes of Exercise per Session: Not on file  Stress: No Stress Concern Present (10/01/2023)   [employer] of Occupational Health - Occupational Stress Questionnaire    Feeling of Stress : Not at all  Social Connections: Moderately Integrated (10/01/2023)   Social Connection and Isolation Panel    Frequency of Communication with Friends and Family: More than three times a week    Frequency of Social Gatherings with Friends and Family: Twice a week    Attends Religious Services: More than 4 times per year    Active Member of [employer] or Organizations: No    Attends Engineer, Structural: Not on file    Marital Status: Married  Catering Manager Violence: Not At Risk (07/17/2023)   Humiliation, Afraid, Rape, and Kick questionnaire    Fear of Current or Ex-Partner: No    Emotionally Abused: No    Physically Abused: No    Sexually Abused: No    FAMILY HISTORY: Family History  Problem Relation Age of Onset   Hypertension Mother    Thyroid  cancer  Maternal Uncle        dx 35s   Stroke Paternal Aunt    Colon cancer Maternal Grandmother        dx 3s   Heart attack Maternal Grandfather    Aortic aneurysm Paternal Grandfather    Breast cancer Other        MGMs sister, d. 1s    ALLERGIES:  is allergic to bee venom; reglan  [metoclopramide ]; shellfish allergy; adhesive [tape]; measles, mumps & rubella vac; sulfa antibiotics; and yellow dye #6 (sunset yellow).  MEDICATIONS:  Current Outpatient Medications  Medication Sig Dispense Refill   ABILIFY  MAINTENA 400 MG SRER injection Inject 400 mg into the muscle every 28 (twenty-eight) days.     acebutolol  (SECTRAL ) 200 MG capsule TAKE ONE CAPSULE BY MOUTH TWICE DAILY 180 capsule 3   albuterol  (VENTOLIN  HFA) 108 (90 Base) MCG/ACT inhaler Inhale 1 puff into the lungs as needed for wheezing or shortness of breath.     AMBIEN  CR 12.5 MG CR tablet Take 12.5 mg by mouth at bedtime.  1   ARIPiprazole  ER (ABILIFY  MAINTENA) 400 MG PRSY prefilled syringe Inject 400 mg into the muscle every 28 (twenty-eight) days. 3 each 1   busPIRone  (BUSPAR ) 10 MG tablet Take 20 mg by mouth 3 (three) times daily.     clonazePAM  (KLONOPIN ) 0.5 MG tablet Take 0.5 mg by mouth 2 (two) times daily as needed for anxiety.     cyanocobalamin  (VITAMIN B12) 1000 MCG/ML injection Inject 1,000 mcg into the skin every 30 (thirty) days.     desipramine  (NORPRAMIN ) 25 MG tablet Take 25 mg by mouth at bedtime.     DEXILANT 30 MG capsule Take 30 mg by mouth 2 (two) times daily.     dicyclomine  (BENTYL ) 10 MG capsule Take 1 capsule (10 mg total) by mouth 3 (three) times daily as needed for spasms (Pain/ spasms).     Difluprednate 0.05 % EMUL Place 1 drop into the right eye 4 (four) times daily.     diltiazem  (CARDIZEM  CD) 180 MG 24 hr capsule TAKE ONE CAPSULE BY MOUTH EVERY DAY 90 capsule 0   diphenoxylate -atropine  (LOMOTIL ) 2.5-0.025 MG tablet Take 1 tablet by mouth 4 (four) times daily as needed for diarrhea or loose stools.      EPINEPHrine  0.3 mg/0.3 mL IJ SOAJ injection Inject 0.3 mg into the muscle as needed for anaphylaxis. 1 each 3   ipratropium (ATROVENT ) 0.03 % nasal spray Place 2 sprays into both nostrils every 12 (twelve) hours. 30 mL 12   ivabradine  (CORLANOR ) 7.5 MG TABS tablet Take 1 tablet (7.5 mg total) by mouth 2 (two) times  daily with a meal. 180 tablet 3   lamoTRIgine  (LAMICTAL ) 100 MG tablet Take 100 mg by mouth at bedtime.     levalbuterol  (XOPENEX  HFA) 45 MCG/ACT inhaler Inhale 1 puff into the lungs as needed for wheezing or shortness of breath.     levalbuterol  (XOPENEX ) 0.63 MG/3ML nebulizer solution Take 0.63 mg by nebulization every 6 (six) hours as needed for wheezing or shortness of breath.     LOTEMAX SM 0.38 % GEL Place 1 drop into the right eye every 2 (two) hours.     methocarbamol  (ROBAXIN ) 750 MG tablet Take 750 mg by mouth 4 (four) times daily as needed.     montelukast  (SINGULAIR ) 10 MG tablet Take 1 tablet (10 mg total) by mouth at bedtime. 30 tablet 3   ondansetron  (ZOFRAN ) 8 MG tablet Take 8 mg by mouth every 8 (eight) hours as needed.     ondansetron  (ZOFRAN -ODT) 4 MG disintegrating tablet DISSOLVE ONE TABLET BY MOUTH EVERY 8 HOURS AS NEEDED REFRACTORY NAUSEA/VOMITING 30 tablet 0   oxyCODONE  (OXY IR/ROXICODONE ) 5 MG immediate release tablet Take 1 tablet (5 mg total) by mouth every 4 (four) hours as needed for up to 7 days for moderate pain (pain score 4-6) or severe pain (pain score 7-10). 42 tablet 0   promethazine  (PHENERGAN ) 25 MG suppository Place 25 mg rectally.     tamoxifen  (NOLVADEX ) 20 MG tablet Take 1 tablet (20 mg total) by mouth daily. 90 tablet 3   traZODone  (DESYREL ) 100 MG tablet Take 100-300 mg by mouth at bedtime as needed for sleep.     No current facility-administered medications for this visit.     PHYSICAL EXAMINATION: ECOG PERFORMANCE STATUS: 0 - Asymptomatic  Vitals:   10/02/24 0936  BP: 120/78  Pulse: 72  Resp: 18  Temp: 97.6 F (36.4 C)  SpO2: 99%     Filed Weights   10/02/24 0936  Weight: 197 lb 11.2 oz (89.7 kg)   GENERAL:alert, no distress and comfortable Bilateral breasts inspected. NO palpable masses. No regional adenopathy, implants noted. GJ tube in place, hypoactive bowel sounds, mild distension noted above the GJ tube. No concern for infection NO LE edema  LABORATORY DATA:  I have reviewed the data as listed Lab Results  Component Value Date   WBC 6.3 06/24/2024   HGB 11.7 (L) 06/24/2024   HCT 35.7 (L) 06/24/2024   MCV 90.2 06/24/2024   PLT 242 06/24/2024     Chemistry      Component Value Date/Time   NA 139 06/24/2024 1526   NA 140 07/28/2019 1520   NA 138 09/07/2013 1409   K 4.1 06/24/2024 1526   K 3.7 09/07/2013 1409   CL 103 06/24/2024 1526   CL 102 11/11/2012 1324   CO2 25 06/24/2024 1526   CO2 23 09/07/2013 1409   BUN 11 06/24/2024 1526   BUN 5 (L) 07/28/2019 1520   BUN 12.4 09/07/2013 1409   CREATININE 0.96 06/24/2024 1526   CREATININE 0.79 04/01/2024 1013   CREATININE 0.81 07/06/2022 1539   CREATININE 0.8 09/07/2013 1409      Component Value Date/Time   CALCIUM  9.0 06/24/2024 1526   CALCIUM  9.4 09/07/2013 1409   ALKPHOS 52 04/01/2024 1013   ALKPHOS 81 09/07/2013 1409   AST 17 04/01/2024 1013   AST 14 09/07/2013 1409   ALT 14 04/01/2024 1013   ALT 28 09/07/2013 1409   BILITOT 0.3 04/01/2024 1013   BILITOT <0.20 09/07/2013 1409  RADIOGRAPHIC STUDIES: I have personally reviewed the radiological images as listed and agreed with the findings in the report. No results found.   All questions were answered. The patient knows to call the clinic with any problems, questions or concerns.     Amber Stalls, MD 10/02/2024 9:48 AM

## 2024-10-02 NOTE — Telephone Encounter (Signed)
 Per Dr. Loretha, called to make pt aware that j-tube is in place. Pt verbalized understanding.

## 2024-10-05 NOTE — Telephone Encounter (Signed)
 Contacted patient and left a detailed VM (verified per DPR).

## 2024-10-07 ENCOUNTER — Telehealth: Admitting: Physician Assistant

## 2024-10-07 DIAGNOSIS — B379 Candidiasis, unspecified: Secondary | ICD-10-CM

## 2024-10-07 DIAGNOSIS — T3695XA Adverse effect of unspecified systemic antibiotic, initial encounter: Secondary | ICD-10-CM | POA: Diagnosis not present

## 2024-10-07 MED ORDER — FLUCONAZOLE 150 MG PO TABS: 150.0000 mg | ORAL_TABLET | ORAL | 0 refills | Status: AC | PRN

## 2024-10-07 NOTE — Progress Notes (Signed)

## 2024-10-08 DIAGNOSIS — F325 Major depressive disorder, single episode, in full remission: Secondary | ICD-10-CM | POA: Diagnosis not present

## 2024-10-19 ENCOUNTER — Encounter: Payer: Self-pay | Admitting: Adult Health

## 2024-10-20 LAB — GUARDANT REVEAL

## 2024-10-26 ENCOUNTER — Other Ambulatory Visit: Payer: Self-pay | Admitting: Family Medicine

## 2024-10-26 ENCOUNTER — Ambulatory Visit: Payer: Self-pay | Admitting: Hematology and Oncology

## 2024-11-03 ENCOUNTER — Other Ambulatory Visit: Payer: Self-pay | Admitting: Cardiology

## 2024-11-05 ENCOUNTER — Ambulatory Visit: Attending: Cardiology | Admitting: Cardiology

## 2024-11-05 ENCOUNTER — Encounter: Payer: Self-pay | Admitting: Cardiology

## 2024-11-05 VITALS — BP 110/74 | HR 72 | Ht 69.0 in | Wt 208.0 lb

## 2024-11-05 DIAGNOSIS — I951 Orthostatic hypotension: Secondary | ICD-10-CM

## 2024-11-05 NOTE — Patient Instructions (Addendum)
 Medication Instructions:  Your physician recommends that you continue on your current medications as directed. Please refer to the Current Medication list given to you today.  *If you need a refill on your cardiac medications before your next appointment, please call your pharmacy*   Follow-Up: At [facility], you and your health needs are our priority.  As part of our continuing mission to provide you with exceptional heart care, our providers are all part of one team.  This team includes your primary Cardiologist (physician) and Advanced Practice Providers or APPs (Physician Assistants and Nurse Practitioners) who all work together to provide you with the care you need, when you need it.  Your next appointment:   1 year(s)  Provider:   Gordy Bergamo, MD        We recommend signing up for the patient portal called MyChart.  Patients are able to view lab/test results, encounter notes, upcoming appointments, etc.  Non-urgent messages can be sent to your provider as well, go to [email or web address].

## 2024-11-05 NOTE — Progress Notes (Signed)
 " Cardiology Office Note:  .   Date:  11/05/2024  ID:  Arnaldo Blair Chancy, DOB 02/05/1981, MRN [id number] PCP: Sebastian Beverley NOVAK, MD  [facility] Providers Cardiologist:  Gordy Bergamo, MD   History of Present Illness: Kaitlyn Johnson is a 44 y.o.  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   Patient has a diagnosis of POTS, celiac disease, superior mesenteric artery syndrome. 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 Since then patient has developed marked dizziness, fatigue, rapid palpitations.  Her recent hospitalization was in September 2024 for nausea, intractable abdominal discomfort eventually needing transfer to [facility] and workup was negative with EGD and has now a gastrostomy tube placed on 09/09/2023.  She continues to have issues with gastroparesis and gastrostomy tube.  In the interim she was diagnosed with right breast cancer in January 2025 and underwent bilateral mastectomy and has breast implants and is on tamoxifen .     Since being on acebutolol  and Corlanor  combination, patient has felt the best she has in quite a while and has not had any further significant tachycardia syndrome.  This is a annual visit.  In spite of all the medical issues, she seems happy and has a positive outlook.    Discussed the use of AI scribe software for clinical note transcription with the patient, who gave verbal consent to proceed.  History of Present Illness Kaitlyn Johnson is a 44 year old female who presents for cardiovascular follow-up.  In August 2025, she had an acute episode that prompted an EMS call. A 12-lead ECG at that time was  normal. She associates the event with dehydration following implant surgery. She now receives about 1700 mL of fluid nightly through a feeding tube for hydration.  She has POTS treated with Corlanor  and Asperolol and does not need refills today.  She had a bilateral mastectomy on December 01, 2023 for right breast cancer with implant reconstruction. The left implant has flipped and is scheduled for surgical correction on November 25, 2024.  She has a pyloric sphincter bypass and is scheduled for a preoperative visit next Thursday, with attention to maintaining adequate volume status.   Cardiac Studies relevent.    Echocardiogram 09/07/2022: Normal LV systolic function with visual EF 60-65%. Left ventricle cavity is normal in size. Normal left ventricular wall thickness. Normal global wall motion. Normal diastolic filling pattern, normal LAP. Calculated EF 68%. Structurally normal tricuspid valve with no regurgitation. No evidence of pulmonary hypertension. No prior available for comparison.  EKG:      Labs   Lab Results  Component Value Date   CHOL 187 11/30/2013   HDL 49 11/30/2013   LDLCALC 117 (H) 11/30/2013   TRIG 197 (H) 08/25/2018   CHOLHDL 3.8 11/30/2013   No results found for: LIPOA  Recent Labs    04/01/24 1013 06/24/24 1526 10/02/24 1018  NA 141 139 139  K 3.8 4.1 4.6  CL 107 103 102  CO2 28 25 27   GLUCOSE 99 97 105*  BUN 5* 11 8  CREATININE 0.79 0.96 0.78  CALCIUM  8.6* 9.0 9.5  GFRNONAA >60 >60 >60    Lab Results  Component Value Date   ALT 14 10/02/2024   AST  24 10/02/2024   ALKPHOS 71 10/02/2024   BILITOT 0.3 10/02/2024      Latest Ref Rng & Units 06/24/2024    3:26 PM 04/01/2024   10:13 AM 11/27/2023   11:21 AM  CBC  WBC 4.0 - 10.5 K/uL 6.3  4.9  7.5   Hemoglobin 12.0 - 15.0 g/dL 88.2  88.5  88.0   Hematocrit 36.0 - 46.0 % 35.7  34.7  36.3   Platelets 150 - 400 K/uL 242  233  255    Lab Results  Component Value Date   HGBA1C 5.0 04/01/2024    ROS  Review of Systems  Cardiovascular:  Negative for chest pain, dyspnea on exertion and leg swelling.   Physical Exam:   VS:  BP 110/74 (BP Location: Left Arm, Patient Position: Sitting)   Pulse 72   Ht 5' 9 (1.753 m)   Wt 208 lb (94.3 kg)   SpO2 98%   BMI 30.72 kg/m    Wt Readings from Last 3 Encounters:  11/05/24 208 lb (94.3 kg)  10/02/24 197 lb 11.2 oz (89.7 kg)  07/08/24 198 lb (89.8 kg)    BP Readings from Last 3 Encounters:  11/05/24 110/74  10/02/24 120/78  07/08/24 112/71   Physical Exam Neck:     Vascular: No carotid bruit or JVD.  Cardiovascular:     Rate and Rhythm: Normal rate and regular rhythm.     Pulses: Intact distal pulses.     Heart sounds: Normal heart sounds. No murmur heard.    No gallop.  Pulmonary:     Effort: Pulmonary effort is normal.     Breath sounds: Normal breath sounds.  Abdominal:     General: Bowel sounds are normal.     Palpations: Abdomen is soft.     Comments: Gastrostomy tube present  Musculoskeletal:     Right lower leg: No edema.     Left lower leg: No edema.     ASSESSMENT AND PLAN: .      ICD-10-CM   1. Dysautonomia orthostatic hypotension syndrome  I95.1      Assessment & Plan Dysautonomia with orthostatic hypotension Managed with Corlanor  and acebutolol . Recent dehydration episode in August likely due to post-implant surgery. Currently well-hydrated with 1700 mL of feeding tube nutrition at night. Cleared for upcoming surgery with emphasis on maintaining hydration. - Continue Corlanor  7.5 mg daily and acebutolol  200 mg twice daily. - Ensure adequate hydration, especially preoperatively. - Coordinated with dietitian for nutritional support.   Follow up: 1 Year  Signed,  Gordy Bergamo, MD, [facility] 11/05/2024, 2:08 PM [facility] [address] [place], [place] [address] Phone: [phone]. Fax:  [phone]  "

## 2024-11-09 ENCOUNTER — Other Ambulatory Visit: Payer: Self-pay | Admitting: Cardiology

## 2024-11-09 ENCOUNTER — Other Ambulatory Visit (HOSPITAL_COMMUNITY): Payer: Self-pay

## 2024-11-09 MED ORDER — ARIPIPRAZOLE 1 MG/ML PO SOLN
PREFILLED_SYRINGE | ORAL | 0 refills | Status: AC
  Filled 2024-11-09: qty 3, 84d supply, fill #0
  Filled 2024-11-10: qty 3, 90d supply, fill #0
  Filled 2024-11-10: qty 3, 84d supply, fill #0
  Filled 2024-11-10: qty 1, 28d supply, fill #0

## 2024-11-10 ENCOUNTER — Encounter (HOSPITAL_COMMUNITY): Payer: Self-pay

## 2024-11-10 ENCOUNTER — Other Ambulatory Visit: Payer: Self-pay

## 2024-11-10 ENCOUNTER — Other Ambulatory Visit (HOSPITAL_COMMUNITY): Payer: Self-pay

## 2024-11-10 NOTE — Progress Notes (Signed)
 Prescription showing too soon to fill until March. [employer] and they show last filled with [facility] on 1/8.  [facility] did not fill prescription so claim did not reverse. Insurance will not allow me to request claim be reversed. [facility] will have to call ins to reverse claim.

## 2024-11-10 NOTE — Progress Notes (Signed)
 Benefits investigation started in another encounter. Calling [facility] to reverse claim

## 2024-11-11 ENCOUNTER — Other Ambulatory Visit (HOSPITAL_COMMUNITY): Payer: Self-pay

## 2024-11-11 ENCOUNTER — Other Ambulatory Visit: Payer: Self-pay

## 2024-11-11 NOTE — Progress Notes (Signed)
 Patient picked up a 3 month supply from [facility] 1/13, and will continue to fill with them. Dis-enrolling.

## 2024-11-18 ENCOUNTER — Other Ambulatory Visit: Payer: Self-pay | Admitting: Cardiology

## 2024-11-19 ENCOUNTER — Telehealth: Payer: Self-pay | Admitting: Hematology and Oncology

## 2024-11-19 NOTE — Telephone Encounter (Signed)
 I spoke with patient as she called in to cancel MD appointment on 11/20/2024. Patient was seen on 10/02/2205 and was told she did not need to be seen for 6 months per MD during last visit. Appointment was cancelled per patient's request.

## 2024-11-20 ENCOUNTER — Inpatient Hospital Stay: Attending: Hematology and Oncology | Admitting: Hematology and Oncology

## 2024-11-30 NOTE — Progress Notes (Signed)
 Case Management Final Discharge Note Patient Information: Name: Kaitlyn Johnson  DOB:22-Jul-1981 Gender:female MRN: [id number]  Admission Date: 11/25/2024 Discharge Date:11/30/2024 10:12 AM Primary Care Provider: Beverley Adine Hummer, MD Preferred Pharmacy: [facility] [place], [place] - [address] [place] - PHONE: [phone] - FAX: [phone]   Unplanned Readmission Score:  16.23 Discharge address: [address] [place] [address] Discharge Transportation: private vehicle `  Discharge Barriers Discharge Barriers Identified  : Other (comment) (medical clearance (gastric clears, resolution of nausea, post op care))  Discharge Plan Discharge Disposition: Home or Self Care          Anticipated Discharge Location: Home  If Plan A discharging location is not feasible: Potential Plan B: Home  Caregiver at home: Self  Discharge Readiness:  Discharge Plan was discussed with Patient Any concerns with discharge plan: No concerns verbalized with current anticipated discharge plan         Final Destination Choice:    Final Assessment Narrative: LOS 5  Days Patient cleared for discharge at this time. Patient family will transport home.  No in-home service needs. No DME needs at discharge.  Pt aware and agrees with DCP.  denies CM needs at this time.  CM to follow for dc needs.  FINAL DC DISPO HOME WITH SELF CARE      Post Acute Referral Placement   No notes of this type exist for this encounter.    Assessment Completed by: Eda Daring, RN

## 2024-12-02 ENCOUNTER — Telehealth: Payer: Self-pay

## 2024-12-02 NOTE — Transitions of Care (Post Inpatient/ED Visit) (Signed)
" ° °  12/02/2024  Name: Kaitlyn Johnson MRN: [id number] DOB: 03/03/1981  Today's TOC FU Call Status: Today's TOC FU Call Status:: Successful TOC FU Call Completed TOC FU Call Complete Date: 12/02/24  Patient's Name and Date of Birth confirmed. Name, DOB  Transition Care Management Follow-up Telephone Call Date of Discharge: 11/30/24 Discharge Facility: Other ([facility]) Name of Other ([facility]) Discharge Facility: [facility] Type of Discharge: Inpatient Admission  Items Reviewed: Did you receive and understand the discharge instructions provided?: Yes Any new allergies since your discharge?: No Dietary orders reviewed?: Yes Type of Diet Ordered:: Clear liquids Do you have support at home?: Yes People in Home [RPT]: spouse Name of Support/Comfort Primary Source: Kaitlyn Johnson  Medications Reviewed Today:  Patient declined med review states her PCP should have been sent information from [facility] surgical team  Medications Reviewed Today   Medications were not reviewed in this encounter     Home Care and Equipment/Supplies: Were Home Health Services Ordered?: NA Any new equipment or medical supplies ordered?: Yes Were you able to get the equipment/medical supplies?: No Do you have any questions related to the use of the equipment/supplies?: No  Functional Questionnaire: Do you need assistance with bathing/showering or dressing?: No Do you need assistance with meal preparation?: No Do you need assistance with eating?: No Do you have difficulty maintaining continence: No Do you need assistance with getting out of bed/getting out of a chair/moving?: No Do you have difficulty managing or taking your medications?: No  Follow up appointments reviewed: PCP Follow-up appointment confirmed?: No (If I need a follow up with Dr. Sebastian I will call and get it) Specialist Hospital Follow-up appointment confirmed?: Yes Date of Specialist follow-up appointment?:  12/07/24 Follow-Up Specialty Provider:: [facility] - reviewed with patient Do you need transportation to your follow-up appointment?: No Do you understand care options if your condition(s) worsen?: Yes-patient verbalized understanding  Education for self-mgmt of post op care patient states she has her DC instructions  provided during this outreach regarding: she states she knows if she needs to follow up and with whom -s/s of worsening condition and when to seek medical attention -importance of completing all post discharge hospital hospital follow up appts -adherence to med regimen [facility] TOC 30-day program enrollment reviewed and discussed with pt/caregiver. They have declined enrollment in 30 day TOC program due to:  feels she has the information she needs and her husband is with her for help/support.  Richerd Fish, RN, BSN, CCM [facility], [facility] RN Care Manager Direct Dial: [phone]        "

## 2025-01-15 ENCOUNTER — Other Ambulatory Visit

## 2025-04-02 ENCOUNTER — Inpatient Hospital Stay: Admitting: Hematology and Oncology

## 2025-04-02 ENCOUNTER — Inpatient Hospital Stay
